# Patient Record
Sex: Female | Born: 1935 | Race: White | Hispanic: No | Smoking: Never smoker
Health system: Southern US, Community
[De-identification: ages and names within clinical notes are randomized; demographics above are authoritative.]

## PROBLEM LIST (undated history)

## (undated) DIAGNOSIS — I4891 Unspecified atrial fibrillation: Secondary | ICD-10-CM

## (undated) DIAGNOSIS — I10 Essential (primary) hypertension: Secondary | ICD-10-CM

## (undated) DIAGNOSIS — M069 Rheumatoid arthritis, unspecified: Secondary | ICD-10-CM

## (undated) DIAGNOSIS — M4802 Spinal stenosis, cervical region: Secondary | ICD-10-CM

## (undated) DIAGNOSIS — M4316 Spondylolisthesis, lumbar region: Secondary | ICD-10-CM

## (undated) DIAGNOSIS — K2980 Duodenitis without bleeding: Secondary | ICD-10-CM

## (undated) DIAGNOSIS — N179 Acute kidney failure, unspecified: Secondary | ICD-10-CM

## (undated) DIAGNOSIS — K311 Adult hypertrophic pyloric stenosis: Secondary | ICD-10-CM

## (undated) DIAGNOSIS — M810 Age-related osteoporosis without current pathological fracture: Secondary | ICD-10-CM

## (undated) DIAGNOSIS — R519 Headache, unspecified: Secondary | ICD-10-CM

## (undated) DIAGNOSIS — I493 Ventricular premature depolarization: Secondary | ICD-10-CM

## (undated) DIAGNOSIS — S42401A Unspecified fracture of lower end of right humerus, initial encounter for closed fracture: Secondary | ICD-10-CM

## (undated) DIAGNOSIS — E785 Hyperlipidemia, unspecified: Secondary | ICD-10-CM

## (undated) DIAGNOSIS — J45909 Unspecified asthma, uncomplicated: Secondary | ICD-10-CM

## (undated) DIAGNOSIS — K3184 Gastroparesis: Secondary | ICD-10-CM

## (undated) DIAGNOSIS — E669 Obesity, unspecified: Secondary | ICD-10-CM

## (undated) DIAGNOSIS — G894 Chronic pain syndrome: Secondary | ICD-10-CM

## (undated) DIAGNOSIS — K279 Peptic ulcer, site unspecified, unspecified as acute or chronic, without hemorrhage or perforation: Secondary | ICD-10-CM

## (undated) DIAGNOSIS — F329 Major depressive disorder, single episode, unspecified: Secondary | ICD-10-CM

## (undated) DIAGNOSIS — I350 Nonrheumatic aortic (valve) stenosis: Secondary | ICD-10-CM

## (undated) DIAGNOSIS — I2510 Atherosclerotic heart disease of native coronary artery without angina pectoris: Secondary | ICD-10-CM

## (undated) DIAGNOSIS — R51 Headache: Secondary | ICD-10-CM

## (undated) DIAGNOSIS — F32A Depression, unspecified: Secondary | ICD-10-CM

## (undated) DIAGNOSIS — K635 Polyp of colon: Secondary | ICD-10-CM

## (undated) DIAGNOSIS — I471 Supraventricular tachycardia: Secondary | ICD-10-CM

## (undated) DIAGNOSIS — K219 Gastro-esophageal reflux disease without esophagitis: Secondary | ICD-10-CM

## (undated) DIAGNOSIS — E871 Hypo-osmolality and hyponatremia: Secondary | ICD-10-CM

## (undated) DIAGNOSIS — I4892 Unspecified atrial flutter: Secondary | ICD-10-CM

## (undated) DIAGNOSIS — Z7409 Other reduced mobility: Secondary | ICD-10-CM

## (undated) DIAGNOSIS — H3530 Unspecified macular degeneration: Secondary | ICD-10-CM

## (undated) DIAGNOSIS — I429 Cardiomyopathy, unspecified: Secondary | ICD-10-CM

## (undated) HISTORY — DX: Supraventricular tachycardia: I47.1

## (undated) HISTORY — DX: Atherosclerotic heart disease of native coronary artery without angina pectoris: I25.10

## (undated) HISTORY — PX: JOINT REPLACEMENT: SHX530

## (undated) HISTORY — DX: Gastro-esophageal reflux disease without esophagitis: K21.9

## (undated) HISTORY — DX: Essential (primary) hypertension: I10

## (undated) HISTORY — DX: Chronic pain syndrome: G89.4

## (undated) HISTORY — DX: Depression, unspecified: F32.A

## (undated) HISTORY — DX: Cardiomyopathy, unspecified: I42.9

## (undated) HISTORY — DX: Unspecified macular degeneration: H35.30

## (undated) HISTORY — PX: HAND SURGERY: SHX662

## (undated) HISTORY — DX: Obesity, unspecified: E66.9

## (undated) HISTORY — DX: Spinal stenosis, cervical region: M48.02

## (undated) HISTORY — DX: Age-related osteoporosis without current pathological fracture: M81.0

## (undated) HISTORY — PX: TONSILLECTOMY: SUR1361

## (undated) HISTORY — PX: ANKLE FUSION: SHX881

## (undated) HISTORY — DX: Duodenitis without bleeding: K29.80

## (undated) HISTORY — DX: Hypo-osmolality and hyponatremia: E87.1

## (undated) HISTORY — DX: Adult hypertrophic pyloric stenosis: K31.1

## (undated) HISTORY — DX: Major depressive disorder, single episode, unspecified: F32.9

## (undated) HISTORY — PX: CHOLECYSTECTOMY: SHX55

## (undated) HISTORY — DX: Gastroparesis: K31.84

## (undated) HISTORY — DX: Spondylolisthesis, lumbar region: M43.16

## (undated) HISTORY — DX: Peptic ulcer, site unspecified, unspecified as acute or chronic, without hemorrhage or perforation: K27.9

## (undated) HISTORY — DX: Hyperlipidemia, unspecified: E78.5

## (undated) HISTORY — PX: CERVICAL FUSION: SHX112

## (undated) HISTORY — DX: Nonrheumatic aortic (valve) stenosis: I35.0

## (undated) HISTORY — DX: Rheumatoid arthritis, unspecified: M06.9

## (undated) HISTORY — DX: Acute kidney failure, unspecified: N17.9

## (undated) HISTORY — PX: CATARACT EXTRACTION, BILATERAL: SHX1313

## (undated) HISTORY — DX: Ventricular premature depolarization: I49.3

## (undated) HISTORY — DX: Unspecified asthma, uncomplicated: J45.909

## (undated) HISTORY — DX: Unspecified atrial flutter: I48.92

---

## 1998-08-06 ENCOUNTER — Emergency Department (HOSPITAL_COMMUNITY): Admission: EM | Admit: 1998-08-06 | Discharge: 1998-08-06 | Payer: Self-pay | Admitting: Emergency Medicine

## 1998-08-06 ENCOUNTER — Encounter: Payer: Self-pay | Admitting: Emergency Medicine

## 1998-11-01 ENCOUNTER — Ambulatory Visit (HOSPITAL_COMMUNITY): Admission: RE | Admit: 1998-11-01 | Discharge: 1998-11-01 | Payer: Self-pay | Admitting: Cardiology

## 1998-11-01 ENCOUNTER — Encounter: Payer: Self-pay | Admitting: Cardiology

## 1999-07-12 ENCOUNTER — Ambulatory Visit (HOSPITAL_COMMUNITY): Admission: RE | Admit: 1999-07-12 | Discharge: 1999-07-12 | Payer: Self-pay | Admitting: Cardiology

## 1999-09-19 ENCOUNTER — Encounter: Admission: RE | Admit: 1999-09-19 | Discharge: 1999-09-19 | Payer: Self-pay

## 1999-10-04 ENCOUNTER — Ambulatory Visit (HOSPITAL_COMMUNITY): Admission: RE | Admit: 1999-10-04 | Discharge: 1999-10-04 | Payer: Self-pay | Admitting: Endocrinology

## 1999-10-04 ENCOUNTER — Encounter: Payer: Self-pay | Admitting: Endocrinology

## 1999-10-19 ENCOUNTER — Ambulatory Visit (HOSPITAL_COMMUNITY): Admission: RE | Admit: 1999-10-19 | Discharge: 1999-10-19 | Payer: Self-pay | Admitting: Endocrinology

## 1999-10-20 ENCOUNTER — Encounter: Payer: Self-pay | Admitting: Endocrinology

## 2000-01-12 ENCOUNTER — Encounter: Admission: RE | Admit: 2000-01-12 | Discharge: 2000-01-12 | Payer: Self-pay | Admitting: Obstetrics and Gynecology

## 2000-01-12 ENCOUNTER — Encounter: Payer: Self-pay | Admitting: Obstetrics and Gynecology

## 2000-02-22 ENCOUNTER — Encounter: Payer: Self-pay | Admitting: Orthopedic Surgery

## 2000-02-26 ENCOUNTER — Inpatient Hospital Stay (HOSPITAL_COMMUNITY): Admission: RE | Admit: 2000-02-26 | Discharge: 2000-02-29 | Payer: Self-pay | Admitting: Orthopedic Surgery

## 2000-02-29 ENCOUNTER — Inpatient Hospital Stay (HOSPITAL_COMMUNITY)
Admission: RE | Admit: 2000-02-29 | Discharge: 2000-03-12 | Payer: Self-pay | Admitting: Physical Medicine & Rehabilitation

## 2000-04-04 ENCOUNTER — Other Ambulatory Visit: Admission: RE | Admit: 2000-04-04 | Discharge: 2000-04-04 | Payer: Self-pay | Admitting: Oral Surgery

## 2000-04-18 ENCOUNTER — Other Ambulatory Visit: Admission: RE | Admit: 2000-04-18 | Discharge: 2000-04-18 | Payer: Self-pay | Admitting: Oral Surgery

## 2001-01-16 ENCOUNTER — Encounter: Payer: Self-pay | Admitting: Obstetrics and Gynecology

## 2001-01-16 ENCOUNTER — Encounter: Admission: RE | Admit: 2001-01-16 | Discharge: 2001-01-16 | Payer: Self-pay | Admitting: Obstetrics and Gynecology

## 2001-06-23 ENCOUNTER — Encounter: Admission: RE | Admit: 2001-06-23 | Discharge: 2001-06-23 | Payer: Self-pay

## 2001-11-14 ENCOUNTER — Ambulatory Visit (HOSPITAL_COMMUNITY): Admission: RE | Admit: 2001-11-14 | Discharge: 2001-11-14 | Payer: Self-pay | Admitting: *Deleted

## 2002-04-24 ENCOUNTER — Encounter: Admission: RE | Admit: 2002-04-24 | Discharge: 2002-04-24 | Payer: Self-pay

## 2002-05-07 ENCOUNTER — Encounter: Admission: RE | Admit: 2002-05-07 | Discharge: 2002-05-07 | Payer: Self-pay

## 2002-05-21 ENCOUNTER — Encounter: Admission: RE | Admit: 2002-05-21 | Discharge: 2002-05-21 | Payer: Self-pay

## 2002-06-16 ENCOUNTER — Encounter: Payer: Self-pay | Admitting: Obstetrics and Gynecology

## 2002-06-16 ENCOUNTER — Encounter: Admission: RE | Admit: 2002-06-16 | Discharge: 2002-06-16 | Payer: Self-pay | Admitting: Obstetrics and Gynecology

## 2002-06-22 ENCOUNTER — Encounter: Admission: RE | Admit: 2002-06-22 | Discharge: 2002-06-22 | Payer: Self-pay | Admitting: Obstetrics and Gynecology

## 2002-06-22 ENCOUNTER — Encounter: Payer: Self-pay | Admitting: Obstetrics and Gynecology

## 2002-07-01 ENCOUNTER — Emergency Department (HOSPITAL_COMMUNITY): Admission: EM | Admit: 2002-07-01 | Discharge: 2002-07-01 | Payer: Self-pay | Admitting: *Deleted

## 2003-05-05 ENCOUNTER — Encounter: Admission: RE | Admit: 2003-05-05 | Discharge: 2003-05-05 | Payer: Self-pay | Admitting: Obstetrics and Gynecology

## 2003-05-05 ENCOUNTER — Encounter: Payer: Self-pay | Admitting: Obstetrics and Gynecology

## 2003-07-30 ENCOUNTER — Encounter (INDEPENDENT_AMBULATORY_CARE_PROVIDER_SITE_OTHER): Payer: Self-pay | Admitting: Specialist

## 2003-07-30 ENCOUNTER — Ambulatory Visit (HOSPITAL_COMMUNITY): Admission: RE | Admit: 2003-07-30 | Discharge: 2003-07-30 | Payer: Self-pay | Admitting: Gastroenterology

## 2003-08-29 ENCOUNTER — Encounter: Admission: RE | Admit: 2003-08-29 | Discharge: 2003-08-29 | Payer: Self-pay

## 2003-10-11 ENCOUNTER — Inpatient Hospital Stay (HOSPITAL_COMMUNITY): Admission: RE | Admit: 2003-10-11 | Discharge: 2003-10-21 | Payer: Self-pay | Admitting: Neurosurgery

## 2003-10-21 ENCOUNTER — Inpatient Hospital Stay (HOSPITAL_COMMUNITY)
Admission: RE | Admit: 2003-10-21 | Discharge: 2003-10-29 | Payer: Self-pay | Admitting: Physical Medicine & Rehabilitation

## 2003-11-30 ENCOUNTER — Encounter
Admission: RE | Admit: 2003-11-30 | Discharge: 2004-02-28 | Payer: Self-pay | Admitting: Physical Medicine & Rehabilitation

## 2004-05-05 ENCOUNTER — Encounter: Admission: RE | Admit: 2004-05-05 | Discharge: 2004-05-05 | Payer: Self-pay | Admitting: Obstetrics and Gynecology

## 2004-05-10 ENCOUNTER — Encounter: Admission: RE | Admit: 2004-05-10 | Discharge: 2004-05-10 | Payer: Self-pay | Admitting: Obstetrics and Gynecology

## 2004-05-15 ENCOUNTER — Encounter
Admission: RE | Admit: 2004-05-15 | Discharge: 2004-08-13 | Payer: Self-pay | Admitting: Physical Medicine & Rehabilitation

## 2004-05-16 ENCOUNTER — Encounter: Admission: RE | Admit: 2004-05-16 | Discharge: 2004-05-16 | Payer: Self-pay | Admitting: Neurosurgery

## 2004-06-05 ENCOUNTER — Encounter: Admission: RE | Admit: 2004-06-05 | Discharge: 2004-06-05 | Payer: Self-pay | Admitting: Neurosurgery

## 2004-06-30 ENCOUNTER — Emergency Department (HOSPITAL_COMMUNITY): Admission: EM | Admit: 2004-06-30 | Discharge: 2004-07-01 | Payer: Self-pay | Admitting: Emergency Medicine

## 2004-07-11 ENCOUNTER — Encounter: Admission: RE | Admit: 2004-07-11 | Discharge: 2004-07-11 | Payer: Self-pay | Admitting: Neurosurgery

## 2004-07-20 ENCOUNTER — Ambulatory Visit (HOSPITAL_COMMUNITY): Admission: RE | Admit: 2004-07-20 | Discharge: 2004-07-20 | Payer: Self-pay | Admitting: *Deleted

## 2004-08-15 ENCOUNTER — Encounter
Admission: RE | Admit: 2004-08-15 | Discharge: 2004-11-13 | Payer: Self-pay | Admitting: Physical Medicine & Rehabilitation

## 2004-08-16 ENCOUNTER — Ambulatory Visit: Payer: Self-pay | Admitting: Physical Medicine & Rehabilitation

## 2004-11-14 ENCOUNTER — Encounter
Admission: RE | Admit: 2004-11-14 | Discharge: 2005-02-12 | Payer: Self-pay | Admitting: Physical Medicine & Rehabilitation

## 2005-02-13 ENCOUNTER — Encounter
Admission: RE | Admit: 2005-02-13 | Discharge: 2005-05-14 | Payer: Self-pay | Admitting: Physical Medicine & Rehabilitation

## 2005-02-14 ENCOUNTER — Ambulatory Visit: Payer: Self-pay | Admitting: Physical Medicine & Rehabilitation

## 2005-03-05 ENCOUNTER — Encounter: Admission: RE | Admit: 2005-03-05 | Discharge: 2005-03-05 | Payer: Self-pay | Admitting: Rheumatology

## 2005-04-25 ENCOUNTER — Encounter: Admission: RE | Admit: 2005-04-25 | Discharge: 2005-04-25 | Payer: Self-pay | Admitting: Internal Medicine

## 2005-05-09 ENCOUNTER — Encounter: Admission: RE | Admit: 2005-05-09 | Discharge: 2005-05-09 | Payer: Self-pay | Admitting: Internal Medicine

## 2005-05-10 ENCOUNTER — Encounter: Admission: RE | Admit: 2005-05-10 | Discharge: 2005-05-10 | Payer: Self-pay | Admitting: Obstetrics and Gynecology

## 2005-05-11 ENCOUNTER — Encounter: Admission: RE | Admit: 2005-05-11 | Discharge: 2005-05-11 | Payer: Self-pay | Admitting: Internal Medicine

## 2005-05-15 ENCOUNTER — Encounter
Admission: RE | Admit: 2005-05-15 | Discharge: 2005-08-13 | Payer: Self-pay | Admitting: Physical Medicine & Rehabilitation

## 2005-05-15 ENCOUNTER — Ambulatory Visit: Payer: Self-pay | Admitting: Physical Medicine & Rehabilitation

## 2005-07-13 ENCOUNTER — Encounter (INDEPENDENT_AMBULATORY_CARE_PROVIDER_SITE_OTHER): Payer: Self-pay | Admitting: Specialist

## 2005-07-13 ENCOUNTER — Ambulatory Visit (HOSPITAL_COMMUNITY): Admission: RE | Admit: 2005-07-13 | Discharge: 2005-07-14 | Payer: Self-pay | Admitting: General Surgery

## 2005-08-10 ENCOUNTER — Ambulatory Visit: Payer: Self-pay | Admitting: Physical Medicine & Rehabilitation

## 2005-08-10 ENCOUNTER — Encounter
Admission: RE | Admit: 2005-08-10 | Discharge: 2005-11-08 | Payer: Self-pay | Admitting: Physical Medicine & Rehabilitation

## 2005-08-22 ENCOUNTER — Encounter
Admission: RE | Admit: 2005-08-22 | Discharge: 2005-08-22 | Payer: Self-pay | Admitting: Physical Medicine & Rehabilitation

## 2005-09-07 ENCOUNTER — Encounter
Admission: RE | Admit: 2005-09-07 | Discharge: 2005-09-07 | Payer: Self-pay | Admitting: Physical Medicine & Rehabilitation

## 2005-09-28 ENCOUNTER — Encounter
Admission: RE | Admit: 2005-09-28 | Discharge: 2005-09-28 | Payer: Self-pay | Admitting: Physical Medicine & Rehabilitation

## 2005-11-06 ENCOUNTER — Encounter
Admission: RE | Admit: 2005-11-06 | Discharge: 2006-02-04 | Payer: Self-pay | Admitting: Physical Medicine & Rehabilitation

## 2005-11-06 ENCOUNTER — Ambulatory Visit: Payer: Self-pay | Admitting: Physical Medicine & Rehabilitation

## 2006-01-30 ENCOUNTER — Ambulatory Visit: Payer: Self-pay | Admitting: Physical Medicine & Rehabilitation

## 2006-04-19 ENCOUNTER — Encounter
Admission: RE | Admit: 2006-04-19 | Discharge: 2006-07-18 | Payer: Self-pay | Admitting: Physical Medicine & Rehabilitation

## 2006-04-19 ENCOUNTER — Ambulatory Visit: Payer: Self-pay | Admitting: Physical Medicine & Rehabilitation

## 2006-05-13 ENCOUNTER — Encounter: Admission: RE | Admit: 2006-05-13 | Discharge: 2006-05-13 | Payer: Self-pay

## 2006-08-01 ENCOUNTER — Encounter
Admission: RE | Admit: 2006-08-01 | Discharge: 2006-10-30 | Payer: Self-pay | Admitting: Physical Medicine & Rehabilitation

## 2006-08-01 ENCOUNTER — Ambulatory Visit: Payer: Self-pay | Admitting: Physical Medicine & Rehabilitation

## 2006-10-21 ENCOUNTER — Ambulatory Visit: Payer: Self-pay | Admitting: Physical Medicine & Rehabilitation

## 2006-11-12 ENCOUNTER — Encounter: Admission: RE | Admit: 2006-11-12 | Discharge: 2006-11-12 | Payer: Self-pay | Admitting: Neurosurgery

## 2006-11-15 ENCOUNTER — Encounter: Admission: RE | Admit: 2006-11-15 | Discharge: 2006-11-15 | Payer: Self-pay | Admitting: Neurosurgery

## 2006-12-10 ENCOUNTER — Inpatient Hospital Stay (HOSPITAL_COMMUNITY): Admission: AD | Admit: 2006-12-10 | Discharge: 2006-12-12 | Payer: Self-pay | Admitting: Cardiology

## 2006-12-20 ENCOUNTER — Ambulatory Visit: Payer: Self-pay | Admitting: Vascular Surgery

## 2006-12-20 ENCOUNTER — Ambulatory Visit (HOSPITAL_COMMUNITY): Admission: RE | Admit: 2006-12-20 | Discharge: 2006-12-20 | Payer: Self-pay | Admitting: Cardiology

## 2006-12-25 ENCOUNTER — Encounter: Admission: RE | Admit: 2006-12-25 | Discharge: 2006-12-25 | Payer: Self-pay | Admitting: Cardiology

## 2007-01-16 ENCOUNTER — Ambulatory Visit: Payer: Self-pay | Admitting: Physical Medicine & Rehabilitation

## 2007-01-16 ENCOUNTER — Encounter
Admission: RE | Admit: 2007-01-16 | Discharge: 2007-04-16 | Payer: Self-pay | Admitting: Physical Medicine & Rehabilitation

## 2007-02-12 ENCOUNTER — Encounter: Admission: RE | Admit: 2007-02-12 | Discharge: 2007-02-12 | Payer: Self-pay | Admitting: Obstetrics and Gynecology

## 2007-05-05 ENCOUNTER — Encounter
Admission: RE | Admit: 2007-05-05 | Discharge: 2007-08-03 | Payer: Self-pay | Admitting: Physical Medicine & Rehabilitation

## 2007-05-05 ENCOUNTER — Ambulatory Visit: Payer: Self-pay | Admitting: Physical Medicine & Rehabilitation

## 2007-06-26 ENCOUNTER — Encounter: Admission: RE | Admit: 2007-06-26 | Discharge: 2007-06-26 | Payer: Self-pay | Admitting: Obstetrics and Gynecology

## 2007-09-11 ENCOUNTER — Encounter
Admission: RE | Admit: 2007-09-11 | Discharge: 2007-09-12 | Payer: Self-pay | Admitting: Physical Medicine & Rehabilitation

## 2007-09-11 ENCOUNTER — Ambulatory Visit: Payer: Self-pay | Admitting: Physical Medicine & Rehabilitation

## 2007-12-31 ENCOUNTER — Ambulatory Visit: Payer: Self-pay | Admitting: Physical Medicine & Rehabilitation

## 2007-12-31 ENCOUNTER — Encounter
Admission: RE | Admit: 2007-12-31 | Discharge: 2008-01-01 | Payer: Self-pay | Admitting: Physical Medicine & Rehabilitation

## 2008-03-24 ENCOUNTER — Encounter
Admission: RE | Admit: 2008-03-24 | Discharge: 2008-03-26 | Payer: Self-pay | Admitting: Physical Medicine & Rehabilitation

## 2008-03-26 ENCOUNTER — Ambulatory Visit: Payer: Self-pay | Admitting: Physical Medicine & Rehabilitation

## 2008-07-06 ENCOUNTER — Encounter: Admission: RE | Admit: 2008-07-06 | Discharge: 2008-07-06 | Payer: Self-pay | Admitting: Obstetrics and Gynecology

## 2008-07-08 ENCOUNTER — Encounter (INDEPENDENT_AMBULATORY_CARE_PROVIDER_SITE_OTHER): Payer: Self-pay | Admitting: Otolaryngology

## 2008-07-08 ENCOUNTER — Ambulatory Visit (HOSPITAL_COMMUNITY): Admission: RE | Admit: 2008-07-08 | Discharge: 2008-07-08 | Payer: Self-pay | Admitting: Otolaryngology

## 2008-08-26 ENCOUNTER — Encounter
Admission: RE | Admit: 2008-08-26 | Discharge: 2008-08-27 | Payer: Self-pay | Admitting: Physical Medicine & Rehabilitation

## 2008-08-27 ENCOUNTER — Ambulatory Visit: Payer: Self-pay | Admitting: Physical Medicine & Rehabilitation

## 2008-11-19 ENCOUNTER — Ambulatory Visit: Payer: Self-pay | Admitting: Physical Medicine & Rehabilitation

## 2008-11-19 ENCOUNTER — Encounter
Admission: RE | Admit: 2008-11-19 | Discharge: 2008-11-19 | Payer: Self-pay | Admitting: Physical Medicine & Rehabilitation

## 2009-02-03 ENCOUNTER — Encounter: Admission: RE | Admit: 2009-02-03 | Discharge: 2009-02-03 | Payer: Self-pay

## 2009-02-10 ENCOUNTER — Encounter
Admission: RE | Admit: 2009-02-10 | Discharge: 2009-05-11 | Payer: Self-pay | Admitting: Physical Medicine & Rehabilitation

## 2009-02-14 ENCOUNTER — Ambulatory Visit: Payer: Self-pay | Admitting: Physical Medicine & Rehabilitation

## 2009-03-16 ENCOUNTER — Ambulatory Visit: Payer: Self-pay | Admitting: Physical Medicine & Rehabilitation

## 2009-04-14 ENCOUNTER — Ambulatory Visit: Payer: Self-pay | Admitting: Physical Medicine & Rehabilitation

## 2009-05-10 ENCOUNTER — Encounter
Admission: RE | Admit: 2009-05-10 | Discharge: 2009-08-08 | Payer: Self-pay | Admitting: Physical Medicine & Rehabilitation

## 2009-05-13 ENCOUNTER — Ambulatory Visit: Payer: Self-pay | Admitting: Physical Medicine & Rehabilitation

## 2009-06-08 ENCOUNTER — Ambulatory Visit: Payer: Self-pay | Admitting: Physical Medicine & Rehabilitation

## 2009-07-07 ENCOUNTER — Encounter: Admission: RE | Admit: 2009-07-07 | Discharge: 2009-07-07 | Payer: Self-pay | Admitting: Internal Medicine

## 2009-07-10 ENCOUNTER — Emergency Department (HOSPITAL_COMMUNITY): Admission: EM | Admit: 2009-07-10 | Discharge: 2009-07-11 | Payer: Self-pay | Admitting: Emergency Medicine

## 2009-07-13 ENCOUNTER — Encounter: Admission: RE | Admit: 2009-07-13 | Discharge: 2009-07-13 | Payer: Self-pay | Admitting: Gastroenterology

## 2009-07-13 ENCOUNTER — Encounter
Admission: RE | Admit: 2009-07-13 | Discharge: 2009-10-11 | Payer: Self-pay | Admitting: Physical Medicine & Rehabilitation

## 2009-07-15 ENCOUNTER — Ambulatory Visit: Payer: Self-pay | Admitting: Physical Medicine & Rehabilitation

## 2009-08-05 ENCOUNTER — Ambulatory Visit: Payer: Self-pay | Admitting: Physical Medicine & Rehabilitation

## 2009-08-06 ENCOUNTER — Inpatient Hospital Stay (HOSPITAL_COMMUNITY): Admission: EM | Admit: 2009-08-06 | Discharge: 2009-08-12 | Payer: Self-pay | Admitting: Emergency Medicine

## 2009-08-10 ENCOUNTER — Encounter (INDEPENDENT_AMBULATORY_CARE_PROVIDER_SITE_OTHER): Payer: Self-pay | Admitting: Gastroenterology

## 2009-08-30 ENCOUNTER — Ambulatory Visit: Payer: Self-pay | Admitting: Physical Medicine & Rehabilitation

## 2009-10-06 ENCOUNTER — Encounter
Admission: RE | Admit: 2009-10-06 | Discharge: 2009-11-02 | Payer: Self-pay | Admitting: Physical Medicine & Rehabilitation

## 2009-10-07 ENCOUNTER — Ambulatory Visit: Payer: Self-pay | Admitting: Physical Medicine & Rehabilitation

## 2009-11-09 ENCOUNTER — Encounter
Admission: RE | Admit: 2009-11-09 | Discharge: 2010-02-07 | Payer: Self-pay | Admitting: Physical Medicine & Rehabilitation

## 2009-11-11 ENCOUNTER — Ambulatory Visit: Payer: Self-pay | Admitting: Physical Medicine & Rehabilitation

## 2009-12-08 ENCOUNTER — Ambulatory Visit (HOSPITAL_COMMUNITY): Admission: RE | Admit: 2009-12-08 | Discharge: 2009-12-08 | Payer: Self-pay | Admitting: Gastroenterology

## 2010-01-05 ENCOUNTER — Ambulatory Visit: Payer: Self-pay | Admitting: Physical Medicine & Rehabilitation

## 2010-03-01 ENCOUNTER — Encounter
Admission: RE | Admit: 2010-03-01 | Discharge: 2010-03-06 | Payer: Self-pay | Admitting: Physical Medicine & Rehabilitation

## 2010-03-06 ENCOUNTER — Ambulatory Visit: Payer: Self-pay | Admitting: Physical Medicine & Rehabilitation

## 2010-05-11 ENCOUNTER — Ambulatory Visit: Payer: Self-pay | Admitting: Physical Medicine & Rehabilitation

## 2010-05-11 ENCOUNTER — Encounter
Admission: RE | Admit: 2010-05-11 | Discharge: 2010-08-09 | Payer: Self-pay | Admitting: Physical Medicine & Rehabilitation

## 2010-06-28 ENCOUNTER — Ambulatory Visit: Payer: Self-pay | Admitting: Physical Medicine & Rehabilitation

## 2010-07-17 ENCOUNTER — Encounter: Admission: RE | Admit: 2010-07-17 | Discharge: 2010-07-17 | Payer: Self-pay | Admitting: Internal Medicine

## 2010-07-19 ENCOUNTER — Encounter: Admission: RE | Admit: 2010-07-19 | Discharge: 2010-07-19 | Payer: Self-pay | Admitting: Internal Medicine

## 2010-08-17 ENCOUNTER — Encounter
Admission: RE | Admit: 2010-08-17 | Discharge: 2010-11-01 | Payer: Self-pay | Source: Home / Self Care | Attending: Physical Medicine & Rehabilitation | Admitting: Physical Medicine & Rehabilitation

## 2010-08-24 ENCOUNTER — Ambulatory Visit: Payer: Self-pay | Admitting: Physical Medicine & Rehabilitation

## 2010-11-01 ENCOUNTER — Ambulatory Visit: Payer: Self-pay | Admitting: Physical Medicine & Rehabilitation

## 2010-11-26 ENCOUNTER — Encounter: Payer: Self-pay | Admitting: Neurosurgery

## 2010-11-27 ENCOUNTER — Encounter: Payer: Self-pay | Admitting: Gastroenterology

## 2010-12-29 ENCOUNTER — Other Ambulatory Visit: Payer: Self-pay

## 2011-02-08 LAB — GLUCOSE, CAPILLARY
Glucose-Capillary: 101 mg/dL — ABNORMAL HIGH (ref 70–99)
Glucose-Capillary: 119 mg/dL — ABNORMAL HIGH (ref 70–99)
Glucose-Capillary: 132 mg/dL — ABNORMAL HIGH (ref 70–99)
Glucose-Capillary: 142 mg/dL — ABNORMAL HIGH (ref 70–99)
Glucose-Capillary: 158 mg/dL — ABNORMAL HIGH (ref 70–99)
Glucose-Capillary: 166 mg/dL — ABNORMAL HIGH (ref 70–99)
Glucose-Capillary: 179 mg/dL — ABNORMAL HIGH (ref 70–99)
Glucose-Capillary: 77 mg/dL (ref 70–99)
Glucose-Capillary: 83 mg/dL (ref 70–99)
Glucose-Capillary: 84 mg/dL (ref 70–99)
Glucose-Capillary: 85 mg/dL (ref 70–99)
Glucose-Capillary: 88 mg/dL (ref 70–99)
Glucose-Capillary: 96 mg/dL (ref 70–99)

## 2011-02-08 LAB — BASIC METABOLIC PANEL WITH GFR
BUN: 9 mg/dL (ref 6–23)
CO2: 22 meq/L (ref 19–32)
Calcium: 7.4 mg/dL — ABNORMAL LOW (ref 8.4–10.5)
Chloride: 109 meq/L (ref 96–112)
Creatinine, Ser: 0.51 mg/dL (ref 0.4–1.2)
GFR calc non Af Amer: >60 mL/min
Glucose, Bld: 190 mg/dL — ABNORMAL HIGH (ref 70–99)
Potassium: 4 meq/L (ref 3.5–5.1)
Sodium: 137 meq/L (ref 135–145)

## 2011-02-08 LAB — CBC
HCT: 32.3 % — ABNORMAL LOW (ref 36.0–46.0)
HCT: 35.7 % — ABNORMAL LOW (ref 36.0–46.0)
Hemoglobin: 11 g/dL — ABNORMAL LOW (ref 12.0–15.0)
Hemoglobin: 12 g/dL (ref 12.0–15.0)
MCHC: 33.7 g/dL (ref 30.0–36.0)
MCHC: 34 g/dL (ref 30.0–36.0)
MCV: 99 fL (ref 78.0–100.0)
MCV: 99.2 fL (ref 78.0–100.0)
Platelets: 165 10*3/uL (ref 150–400)
RBC: 3.26 MIL/uL — ABNORMAL LOW (ref 3.87–5.11)
RBC: 3.6 MIL/uL — ABNORMAL LOW (ref 3.87–5.11)
RDW: 15.2 % (ref 11.5–15.5)
WBC: 8 10*3/uL (ref 4.0–10.5)

## 2011-02-08 LAB — BASIC METABOLIC PANEL
BUN: <1 mg/dL — ABNORMAL LOW (ref 6–23)
CO2: 24 mEq/L (ref 19–32)
Calcium: 8 mg/dL — ABNORMAL LOW (ref 8.4–10.5)
Chloride: 102 mEq/L (ref 96–112)
Chloride: 111 mEq/L (ref 96–112)
Creatinine, Ser: 0.62 mg/dL (ref 0.4–1.2)
GFR calc Af Amer: >60 mL/min (ref >60)
GFR calc Af Amer: >60 mL/min (ref >60)
Glucose, Bld: 105 mg/dL — ABNORMAL HIGH (ref 70–99)
Glucose, Bld: 86 mg/dL (ref 70–99)
Potassium: 3.2 mEq/L — ABNORMAL LOW (ref 3.5–5.1)
Potassium: 3.2 mEq/L — ABNORMAL LOW (ref 3.5–5.1)
Sodium: 138 mEq/L (ref 135–145)

## 2011-02-08 LAB — COMPREHENSIVE METABOLIC PANEL
BUN: 1 mg/dL — ABNORMAL LOW (ref 6–23)
CO2: 23 mEq/L (ref 19–32)
Calcium: 7.9 mg/dL — ABNORMAL LOW (ref 8.4–10.5)
Creatinine, Ser: 0.49 mg/dL (ref 0.4–1.2)
GFR calc Af Amer: >60 mL/min (ref >60)
GFR calc non Af Amer: >60 mL/min (ref >60)
Glucose, Bld: 110 mg/dL — ABNORMAL HIGH (ref 70–99)
Total Bilirubin: 0.4 mg/dL (ref 0.3–1.2)

## 2011-02-08 LAB — CLOTEST (H. PYLORI), BIOPSY: Helicobacter screen: NEGATIVE

## 2011-02-08 LAB — HEMOGLOBIN A1C
Hgb A1c MFr Bld: 5.2 % (ref 4.6–6.1)
Mean Plasma Glucose: 103 mg/dL

## 2011-02-08 LAB — H. PYLORI ANTIBODY, IGG: H Pylori IgG: <0.4 {ISR}

## 2011-02-08 LAB — POCT CARDIAC MARKERS
Myoglobin, poc: 49.3 ng/mL (ref 12–200)
Troponin i, poc: <0.05 ng/mL (ref 0.00–0.09)

## 2011-02-09 LAB — COMPREHENSIVE METABOLIC PANEL
Albumin: 2.7 g/dL — ABNORMAL LOW (ref 3.5–5.2)
BUN: 7 mg/dL (ref 6–23)
Creatinine, Ser: 0.58 mg/dL (ref 0.4–1.2)
Total Protein: 5.4 g/dL — ABNORMAL LOW (ref 6.0–8.3)

## 2011-02-09 LAB — DIFFERENTIAL
Basophils Absolute: 0 10*3/uL (ref 0.0–0.1)
Lymphocytes Relative: 23 % (ref 12–46)
Monocytes Absolute: 0.7 10*3/uL (ref 0.1–1.0)
Monocytes Relative: 12 % (ref 3–12)
Neutro Abs: 4 10*3/uL (ref 1.7–7.7)

## 2011-02-09 LAB — CBC
HCT: 42.3 % (ref 36.0–46.0)
MCV: 98.8 fL (ref 78.0–100.0)
Platelets: 236 10*3/uL (ref 150–400)
RDW: 15.2 % (ref 11.5–15.5)

## 2011-02-09 LAB — POCT CARDIAC MARKERS
Myoglobin, poc: 38.5 ng/mL (ref 12–200)
Troponin i, poc: <0.05 ng/mL (ref 0.00–0.09)

## 2011-02-21 ENCOUNTER — Encounter: Payer: Medicare Other | Attending: Physical Medicine & Rehabilitation | Admitting: Physical Medicine & Rehabilitation

## 2011-02-21 DIAGNOSIS — M4800 Spinal stenosis, site unspecified: Secondary | ICD-10-CM

## 2011-02-21 DIAGNOSIS — M47812 Spondylosis without myelopathy or radiculopathy, cervical region: Secondary | ICD-10-CM

## 2011-02-21 DIAGNOSIS — M069 Rheumatoid arthritis, unspecified: Secondary | ICD-10-CM | POA: Insufficient documentation

## 2011-02-21 DIAGNOSIS — Q762 Congenital spondylolisthesis: Secondary | ICD-10-CM | POA: Insufficient documentation

## 2011-02-21 DIAGNOSIS — M4712 Other spondylosis with myelopathy, cervical region: Secondary | ICD-10-CM | POA: Insufficient documentation

## 2011-02-21 DIAGNOSIS — I2510 Atherosclerotic heart disease of native coronary artery without angina pectoris: Secondary | ICD-10-CM | POA: Insufficient documentation

## 2011-02-22 NOTE — Assessment & Plan Note (Signed)
Angela [name] is back regarding her cervical stenosis, myelopathy, and rheumatoid arthritis.  She continues to improve off the OxyContin being more alert, awake, and begin active.  She is out at the yard and at the beach in her powered scooter helping with activities.  Family is very happy with how she has been doing.  Her pain is about 5-6/10.  She usually uses 3 Percocet for a day for pain and utilize the Voltaren gel, Lidoderm, and Biofreeze for topical relief and likes these as well.  REVIEW OF SYSTEMS:  Notable for the above.  Full 12-point review is in the written health history section of the chart.  SOCIAL HISTORY:  The patient is married, lives with her husband who is with her as always today.  PHYSICAL EXAMINATION:  VITAL SIGNS:  Blood pressure is 115/68, pulse 73, respiratory rate 18, and she is saturating 96% on room air. GENERAL:  The patient is pleasant, alert, and oriented. MUSCULOSKELETAL:  She has an obvious rheumatoid deformities of the hands and feet.  Neck is stable with limited range of motion.  Strength is generally intact per baseline in the upper and lower extremities. HEART:  Regular. CHEST:  Clear. ABDOMEN:  Soft and nontender.  ASSESSMENT: 1. Cervical stenosis with myelopathy status post T1-T2 laminectomy. 2. Rheumatoid arthritis. 3. Lumbar spondylolisthesis. 4. Coronary artery disease.  PLAN: 1. We discussed continued activity to tolerance.  I really think she     is doing well and knows her limits. 2. We will refill her Percocet 7.5 one q.6 h p.r.n. #120.  She is     given another prescription for next month.  I asked her to check     with Dr. Valentina Lucks to see if he will be willing to follow her     Percocet for her.  If not, we will continue to follow as we have     been. 3. She will stay with the Voltaren gel, Lidoderm, and Biofreeze. 4. Nurse practitioner will see her back in 2 months' time.     Ranelle Oyster, M.D. Electronically  Signed    ZTS/MedQ D:  02/21/2011 14:10:40  T:  02/22/2011 01:13:02  Job #:  [id number]  cc:   Thora Lance, M.D. Fax: [phone]

## 2011-03-20 NOTE — Assessment & Plan Note (Signed)
Angela [name] returns to the clinic today for followup evaluation.  She  last saw the PA with Dr. Newell Coral, specifically [place]y, Mar 05, 2008.  At  that time, they adjusted her dose of Neurontin from 1800 mg daily to  3600 mg daily.  She is taking 600 mg 2 tablets t.i.d. at present.  She  does need a refill on her oxycodone immediate release and her OxyContin  sustained release.  She also requests samples of Lidoderm patch along  with Lidoderm prescription.  Her family is somewhat concerned about some  memory problems that she has had.  They would like her to be on Aricept,  and she is willing to take that at the present time.   MEDICATIONS:  1. Nexium 40 mg b.i.d.  2. Toprol XL 25 mg daily.  3. Prednisone 5 mg daily.  4. Lasix 20 mg daily.  5. Zocor 40 mg daily.  6. Potassium chloride 20 mEq daily.  7. Oxycodone 5/325 one tablet q.i.d. p.r.n.  8. OxyContin sustained release 20 mg q.h.s.  9. Flexeril 10 mg p.r.n.  10.Methotrexate 2.5 mg 3 tablets per week.  11.Allegra 180 mg daily.  12.Lorazepam 1 mg q.h.s.  13.Nasonex 2 sprays each nostril daily.  14.Combivent 2 puffs q.i.d.  15.Singulair 10 mg daily.  16.Calcium with vitamin D 500 mg t.i.d.  17.Actonel 35 mg weekly.  18.Gabapentin 600 mg 2 tablets t.i.d.  19.Plavix 75 mg daily.  20.Aspirin 81 mg daily.   REVIEW OF SYSTEMS:  Noncontributory.   PHYSICAL EXAMINATION:  GENERAL:  A well-appearing middle-aged to elderly  adult female sitting in a manual wheelchair.  VITAL SIGNS:  Blood pressure is 132/50 with a pulse of 55, respiratory  rate 18 and O2 saturation 94% on room air.  EXTREMITIES:  She has 4-/5 strength throughout the bilateral upper and  lower extremities.  She has significantly decreased cervical range of  motion.   IMPRESSION:  1. Status post cervical one-cervical two laminectomy secondary to      cervical stenosis with myelopathy.  2. History of rheumatoid arthritis with several joint replacements.  3.  Moderate-to-severe lumbar spondylolisthesis at lumbar three-four      and lumbar four-five.  4. Hypertension.  5. Coronary artery disease with recent stenting of the left anterior      descending.   In the office today, we did refill the patient's oxycodone sustained  release and Percocet each as of April 10, 2008.  We also started her  Aricept 5 mg p.o. q.h.s. and gave her samples of Lidoderm patches along  with a prescription dated for April 29, 2008.  Her husband will be  mailing in the Lidoderm prescription as she has coverage for that  medication through at least September 2009.  We will plan on seeing her  in followup in approximately 3 months' time.           ______________________________  [name], M.D.     DC/MedQ  D:  03/26/2008 14:35:18  T:  03/26/2008 15:21:30  Job #:  [id number]

## 2011-03-20 NOTE — Assessment & Plan Note (Signed)
Angela [name] returns to clinic today for followup evaluation accompanied  by her husband.  Unfortunately, the patient fell at her home through the  night August 05, 2008, and fractured her right elbow.  She was seen by  Dr. Priscille Kluver locally, and he recommended that she be seen by [facility]  physicians.  She went into see the [facility] physicians yesterday, and they  immediately said that she was not a candidate for open reduction and  internal fixation or an elbow arthroplasty.  She has been asked to wear  her splint, and a sling on her right arm and allowed the fracture to  heal gradually overtime.  She has used slightly increased amounts of her  Percocet and we would like to have the ability to use slightly increased  amounts compared to what she was using previously which was about 5 per  day.   MEDICATIONS:  1. Nexium 40 mg b.i.d.  2. Toprol-XL 25 mg daily.  3. Prednisone 5 mg daily.  4. Lasix 20 mg daily.  5. Zocor 40 mg daily.  6. Potassium chloride 20 mEq daily.  7. Oxycodone 7.5/325 one tablet q.i.d. p.r.n.  8. OxyContin sustained release 20 mg at bedtime.  9. Flexeril 10 mg p.r.n.  10.Methotrexate 7.5 mg 3 tablets weekly.  11.Allegra 180 mg daily.  12.Lorazepam 1 mg at bedtime.  13.Nasonex 2 sprays each nostril daily.  14.Combivent 2 puffs q.i.d.  15.Singulair 10 mg daily.  16.Calcium with vitamin D 500 mg t.i.d.  17.Actonel 35 mg weekly.  18.Gabapentin 600 mg 2 tablets t.i.d.  19.Plavix 75 mg daily.  20.Aspirin 81 mg daily.   REVIEW OF SYSTEMS:  Noncontributory.   PHYSICAL EXAMINATION:  GENERAL:  Elderly adult female seated in a manual  wheelchair.  VITAL SIGNS:  Vitals were not obtained.  EXTREMITIES:  She has a sling on her right upper extremity, has a bruise  of the right elbow with decreased range of motion.   IMPRESSION:  1. Status post cervical C1-C2 laminectomy secondary to cervical      stenosis with myelopathy.  2. History of rheumatoid arthritis with several joint  replacements.  3. Moderate-to-severe lumbar spondylolisthesis of the lumbar L3 and L4      vertebrae.  4. Hypertension.  5. Coronary artery disease with recent stenting of the left anterior      descending.  6. Status post fall with right elbow fracture, conservative care.   In the office today, we did refill her Percocet 7.5/325 one tablet 7  times per day p.r.n. as of September 05, 2008.  We also refilled her  OxyContin for that date.  We will plan on seeing her in followup in  approximately 3 months' time with refills prior to that appointment as  necessary.           ______________________________  [name], M.D.     DC/MedQ  D:  08/27/2008 13:36:36  T:  08/28/2008 02:27:39  Job #:  [id number]

## 2011-03-20 NOTE — Op Note (Signed)
NAME:  Angela [name], Angela [name]                ACCOUNT NO.:  [id number]   MEDICAL RECORD NO.:  [id number]          PATIENT TYPE:  AMB   LOCATION:  SDS                          FACILITY:  [facility]   PHYSICIAN:  Jefry H. Pollyann Kennedy, MD     DATE OF BIRTH:  September 28, 1936   DATE OF PROCEDURE:  07/08/2008  DATE OF DISCHARGE:  07/08/2008                               OPERATIVE REPORT   PREOPERATIVE DIAGNOSIS:  Right auricular lesion, possible carcinoma.   POSTOPERATIVE DIAGNOSIS:  Right auricular lesion, possible carcinoma.   PROCEDURE:  Wedge excision of the right auricular lesion.   SURGEON:  Jefry H. Pollyann Kennedy, MD   ANESTHESIA:  Local anesthesia was used with monitored anesthesia care  and intravenous sedation.   BLOOD LOSS:  No blood loss.   FINDINGS:  A slightly longer than 1-cm crusted lesion along the right  helical rim.   REFERRING PHYSICIAN:  Armanda Magic, MD   HISTORY:  This is a 75 year old lady with a 1-year history of an  intermittently painful lesion outside the right auricle.  Risks,  benefits, alternatives, and complications of the procedure were  explained to the patient.  She seemed to understand and agreed for  surgery.   PROCEDURE:  The patient was taken to the operating room, placed on the  operating table in supine position.  Following the induction of  intravenous sedation, the right ear was prepped and draped in a standard  fashion.  A marking pen was used to outline the proposed incisions  around the lesion leaving adequate margins.  A 1% Xylocaine with  epinephrine was infiltrated around the entire proposed incision.  A 15  scalpel was used to excise the lesion in its entirety.  An additional  wedge was removed from the antihelical, somewhat into the conchal  cartilage to facilitate closure.  The wound was then closed in layers  using 4-0 chromic on the cartilage and combination of interrupted and  running 5-0 plain gut on the skin.  Bacitracin was applied.  Specimen  was sent  for pathologic evaluation.  The patient was then transferred to  recovery in stable condition.      Jefry H. Pollyann Kennedy, MD  Electronically Signed     JHR/MEDQ  D:  07/08/2008  T:  07/09/2008  Job:  [id number]   cc:   Armanda Magic, M.D.

## 2011-03-20 NOTE — Assessment & Plan Note (Signed)
Angela [name] returns to clinic today for follow-up evaluation accompanied  by her husband.  Overall she is doing fairly well.  She does complaint  of some bloating especially after dinner in the evening and feels that  she needs to be evaluated for that.  She has not yet mentioned it to her  new primary care physician Dr. Valentina Lucks but she plans to do so in the  near future.  She has regular bowel movements at this time at least once  to twice per day. She has used Reglan in the past but she does not know  the reason nor does she know why that was stopped.   The patient has been experiencing some left upper arm pain.  She reports  that he does help with that pain.  She has not yet tried lidocaine patch  for that area.   The patient does need a refill on her OxyContin sustained release 20 mg  q.h.s. and her Percocet which she usually 4 x per day.   In terms of her abdominal complaints she does feel a burning sensation  occasionally. She has not been told that she has any ulcers but is  concerned about that and is considering requesting and endoscopy.   MEDICATIONS:  1. Nexium 40 mg b.i.d.  2. Toprol XL 25 mg q. day.  3. Prednisone 5 mg q. day.  4. Lasix 20 mg q. day.  5. Zocor 40 mg q. day.  6. Potassium chloride 20 mEq q. day.  7. Oxycodone 5/325 1 tablet q.i.d. p.r.n.  8. OxyContin sustained release 20 mg q.h.s.  9. Flexeril 10 mg p.r.n.  10.Methotrexate 2.5 mg 3 tablets per week.  11.Allegra 180 mg q. day.  12.Lorazepam 1 mg q.h.s.  13.Nasonex 2 sprays each nostril daily.  14.Combivent 2 puffs q.i.d.  15.Singulair 10 mg 1 tablet q. day.  16.Calcium with vitamin D 500 mg t.i.d.  17.Actonel 35 mg q. week.  18.Gabapentin 300 mg t.i.d.  19.Plavix 75 mg q. day.  20.Aspirin 81 mg q. day.   REVIEW OF SYSTEMS:  Non-contributory.   PHYSICAL EXAMINATION:  GENERAL:  Reasonably well appearing overweight  adult elderly female seated in a manual wheelchair.  VITALS:  Blood pressure is  102/41 with pulse of 58, respiratory 18, and  O2 saturation 97% on room air.  EXTREMITIES:  She has 4-/5 strength throughout the bilateral upper and  lower extremities.   IMPRESSION:  1. Status post C1-C2 laminectomy secondary to cervical stenosis with      myelopathy.  2. History of rheumatoid arthritis with several joint replacements.  3. Moderate to severe lumbar spondylolisthesis at L3/L4 and L4/L5.  4. Hypertension.  5. Coronary artery disease with recent stenting of the left anterior      descending.   In the office today we did refill the patient's Percocet and OxyContin  as of the 15th of this month.  We also gave her samples of lidocaine  patches to be applied to the left arm and back and prescription for the  lidocaine patch.  Also gave her a prescription for low dose Reglan to be  used 5 mg b.i.d. p.r.n. over the next couple of weeks to see if that  helps with any of her GI symptoms.  She will be contacting her primary  care physician to see about seeing a GI specialist if the low dose  Reglan does not give her some benefit.  She ultimately will likely need  endoscopy to rule  out ulcer disease.   Will plan on seeing the patient in follow-up in approximately 4 months  time with refills prior to that appointment if necessary.           ______________________________  [name], M.D.     DC/MedQ  D:  09/12/2007 15:00:06  T:  09/13/2007 08:08:46  Job #:  [id number]

## 2011-03-20 NOTE — Assessment & Plan Note (Signed)
[name] returns to clinic today accompanied by her husband.  The  patient is doing fairly well overall.  Her husband has been able to get  the insurance carrier to cover the Lidoderm patches.  She continues to  use 2 of those daily on her left lower buttock and upper left thigh that  seems to give her good amount of relief and fortunately the insurance  company has reconsidered their initial decision.  The patient continues  to use OxyContin sustained-release 20 mg at bedtime and oxycodone  7.5/325 up to 7 times per day, although she uses it 4-5 times per day at  present.  Her rheumatologist has taken her off the methotrexate at this  time.  Dr. Newell Coral has also lowered her Neurontin dose down to 1800 mg  daily.   The patient does need to refill on her OxyContin and oxycodone in the  office today.   MEDICATIONS:  1. Nexium 40 mg b.i.d.  2. Toprol-XL 25 mg daily.  3. Prednisone 5 mg daily.  4. Lasix 20 mg daily.  5. Zocor 40 mg daily.  6. Potassium chloride 20 mEq daily.  7. Oxycodone 7.5/325 one tablet 7 times per day p.r.n. (4-5 per day).  8. OxyContin sustained-release 20 mg nightly.  9. Flexeril 10 mg daily p.r.n.  10.Allegra 180 mg.  11.Lorazepam 1 mg nightly.  12.Nasonex 2 sprays each nostril daily.  13.Combivent 2 puffs q.i.d.  14.Singulair 10 mg daily.  15.Calcium with vitamin D 500 mg t.i.d.  16.Actonel 35 mg weekly.  17.Gabapentin 600 mg 1 tablet t.i.d.  18.Plavix 75 mg daily.  19.Aspirin 81 mg daily   REVIEW OF SYSTEMS:  Noncontributory.   The patient reports that her pain interferes with her standing,  sleeping, social life, traveling, walking, lifting, personal care and  household management.   PHYSICAL EXAMINATION:  GENERAL:  A well appearing elderly adult female  seated in a manual chair.  VITAL SIGNS:  Not obtained.  MUSCULOSKELETAL:  She has 4-/5 strengths throughout the bilateral upper  extremities with decreased bulk and decreased range of motion.   Lower  extremity exam showed hip flexion, knee extension, and ankle  dorsiflexion at 3+ to 4-/5.   IMPRESSION:  1. Status post cervical C1-C2 laminectomy secondary to cervical      stenosis with myelopathy.  2. History of rheumatoid arthritis with several joint replacements.  3. Moderate to severe lumbar spondylolisthesis of the lumbar spine at      L3 and L4.  4. Hypertension.  5. Coronary artery disease with recent stenting of the left anterior      descending.  6. Status post fall with recent right elbow fracture, conservative      care with improvement.   In the office today, we did refill the patient's OxyContin sustained-  release and her Percocet each as of November 19, 2008.  We will plan on  seeing her in followup in 3 months' time.  She continues to be very  compliant with medication without signs of any diversion, whatsoever  with good benefit without significant side effects.  We will plan on  seeing the patient in followup as noted above.           ______________________________  [name], M.D.     DC/MedQ  D:  11/19/2008 12:36:52  T:  11/20/2008 02:18:11  Job #:  [id number]

## 2011-03-20 NOTE — Assessment & Plan Note (Signed)
Angela [name] is back regarding her rheumatoid arthritis and spondylosis in  the cervical lumbar spine.  Apparently, she broke her elbow over the  last 2 months.  She had received some more Percocet through Dr. Thomasena Edis  to help cover her breakthrough pain.  Pain has been subsiding somewhat.  Her elbow was managed conservatively.  Her pain is 5-6/10, described as  dull and aching.  Pain interferes with general activity, relations with  others, enjoyment of life on a moderate-to-severe level.  Sleep is  generally good at this point.   REVIEW OF SYSTEMS:  Notable for anxiety, weight loss.  She had a CT scan  of the abdomen recently which was negative.  She reports night sweats  and poor appetite.  Other pertinent positives are above, and full review  is in the written health and history section of the chart.   SOCIAL HISTORY:  The patient is married, living with her husband who is  with her today and supportive.   PHYSICAL EXAMINATION:  Blood pressure is 106/66, pulse 56, respiratory  rate 18.  She is sating 94% on room air.  The patient is pleasant,  alert, and oriented x3.  She is rather frail appearing.  She has limited  range of motion in all joints.  The right elbow is frozen and  essentially 30-40 degrees of flexion.  I tried to range it passively and  she had some crepitus present at the elbow with some associated pain.  Strength is generally 3-4/5 throughout.  Cognitively, she is very  appropriate.  She does lack a bit of insight and awareness at times.  Heart is regular.  Chest is clear.  Abdomen is soft, nontender.   ASSESSMENT:  1. Cervical stenosis, myelopathy status post C1-C2 laminectomy.  2. History of rheumatoid arthritis.  3. Severe lumbar spondylolisthesis at L3-L4.  4. Hypertension.  5. Coronary artery disease.  6. Recent elbow fracture on the right.   PLAN:  1. We have discussed other options for long-acting pain management      today.  We will stay with OxyContin  CR 20 mg 1 q.12 hours #30.  2. We will reduce Percocet back to 5 per day 7.5/325 dose.  Goal would      be to get to 3-4 a day down the line.  3. We will add patches 2 twice a day to hips for possible bursitis.      She can use these in addition to the Lidoderm patches.  The patient      was given samples of these today.  4. We will see her back in about 5 weeks in nursing clinic, in 9 weeks      with me.      Ranelle Oyster, M.D.  Electronically Signed     ZTS/MedQ  D:  02/14/2009 13:26:06  T:  02/15/2009 03:14:59  Job #:  [id number]

## 2011-03-20 NOTE — Assessment & Plan Note (Signed)
Angela [name] is back regarding her rheumatoid arthritis-associated pain.  She  notes some increased pain here and there over her right hip.  She is  still on her schedule of 5 Percocet a day with OxyContin CR 20 mg at  night.  She uses Lidoderm patches successfully for her back and hips  when pain flares.  Pain is described as aching.  Pain interferes with  general activity, relations with others, enjoyment of life on a mild-to-  moderate level.   REVIEW OF SYSTEMS:  Notable for some anxiety.  Other pertinent positives  are above and full review is in the written health and history section  of the chart.   SOCIAL HISTORY:  Unchanged.  Husband is with her today again.   PHYSICAL EXAMINATION:  Blood pressure 127/57, pulse 78, respiratory rate  18.  She is sating 98% on room air.  The patient is pleasant, alert and  oriented x3.  She is brighter today on the whole.  She has some pain in  the right greater trochanter region with palpation.  The back is  somewhat tender with palpation and flexion greater than extension today.  Elbow remains limited at range of motion once again.  Strength  throughout is 3-4/5.  She has a few bruises and areas of small skin  breakdown.  Cognitively, she is appropriate with fair insight and  awareness.  Heart is regular.  Chest is clear.  Abdomen is soft,  nontender.   ASSESSMENT:  1. Cervical stenosis with myelopathy status post C1-C2 laminectomy.  2. History rheumatoid arthritis.  3. Severe lumbar spondylolisthesis at L3-L4.  4. Hypertension.  5. Coronary artery disease.  6. Recent elbow fracture on the right.   PLAN:  1. We will switch her OxyContin from bedtime to 20 mg q.12 h. and back      off her Percocet 3-4 times a day with goal of reducing to 2 per day      ultimately.  I gave her 60 Oxy CR 20 mg tabs as well as 127.5      Percocet today.  2. Encouraged use of Lidoderm patches to hips and low back.  3. I gave the patient 2 samples of topical  diclofenac, one is the      Voltaren gel and one is Pennsaid.  She will use this on a limited      basis to see if it provides any relief topically to the hips and      back.  4. We will see her back in 3 months with me, 1 month in nursing.      Ranelle Oyster, M.D.  Electronically Signed     ZTS/MedQ  D:  05/13/2009 13:34:05  T:  05/14/2009 02:27:59  Job #:  [id number]

## 2011-03-20 NOTE — Assessment & Plan Note (Signed)
Angela [name] returns to the clinic today accompanied by her husband. The  patient has been experiencing diarrhea for 2 weeks. She apparently had  lab work and a urine sample tested by Dr. Valentina Lucks and was told she had a  urinary tract infection. She was placed on double antibiotics and  reports that her diarrhea was better for a couple of days but then has  worsened. She had stool samples that she is planning to take into the  doctor's office today for testing.   The patient does report poor sleep recently using the lorazepam 1 mg.  She would like to have that dose adjusted. She does report that she  needs refills on her Percocet and OxyContin in the office today. She  feels that she is not getting quite as much relief from her Percocet use  5/325 one tablet q.i.d. She also requests a refill on her Advair.   CURRENT MEDICATIONS:  1. Prednisone 5 mg.  2. Prilosec 20 mg daily p.r.n.  3. Lasix 20 mg daily.  4. K-Dur 1 tablet daily.  5. Methotrexate 2.5 mg 3 tablets weekly.  6. OxyContin sustained release 20 mg q.h.s.  7. Flexeril 10 mg 1-2 tablets p.o. daily p.r.n.  8. Centrum Cardio 1 tablet daily.  9. Calcium 1000 mg daily.  10.Allegra 80 mg daily.  11.Zocor 40 mg daily.  12.Combivent metered dose inhaler 2 puffs q.i.d.  13.Actonel 35 mg q. week.  14.Lorazepam q.h.s.  15.Percocet 5/325 one tablet q.i.d. p.r.n.  16.Lidoderm patches p.r.n.  17.Folic acid 800 mcg daily.  18.Advair 50/250 one puff b.i.d. p.r.n.   REVIEW OF SYSTEMS:  Positive for weight loss, diarrhea, poor appetite  and wheezing.   PHYSICAL EXAMINATION:  GENERAL:  A well-appearing, middle-age, adult  female in mild acute discomfort.  VITAL SIGNS:  Blood pressure 121/62 with a pulse of 87, respiratory rate  16 and O2 saturation 95% on room air.  The patient has 4/5 strength throughout the bilateral upper and lower  extremities.   IMPRESSION:  1. Status post C1-C2 laminectomy secondary to cervical stenosis with    myelopathy.  2. History of rheumatoid arthritis with several joint replacements.  3. Moderate to severe lumbar spondylolisthesis at L3 on L4 and L4 on      L5.  4. Hypertension.  5. Coronary artery disease with recent stenting of left anterior      descending.   In the office today, we did refill the patient's Percocet at a 7.5/325  dose which is actually a slight increase from her present medicine. Also  we filled her OxyContin extended release and increased her lorazepam to  2 mg p.o. q.h.s. Dr. Newell Coral has started her on Neurontin at 300 mg  t.i.d. No refill was necessary on that medication. We did refill her  Advair inhaler. We will plan on seeing her in followup in approximately  3 months' time with refills prior to that appointment as necessary.           ______________________________  [name], M.D.     DC/MedQ  D:  05/12/2007 16:34:01  T:  05/13/2007 07:48:14  Job #:  [id number]

## 2011-03-20 NOTE — Assessment & Plan Note (Signed)
DATE OF EVALUATION:  January 01, 2008   Ms. Dalto returns to clinic today accompanied by her husband.  Overall  she is doing well.  She continues to use her OxyContin 20 mg at night  and her Percocet generally four times per day.  She is requesting that  we write a note to her insurance company to try to get Lidocaine patches  approved for her.  They apparently are giving her a lot to trouble, and  she has appealed at least once without satisfactory finding.  She  certainly benefits form the Lidocaine patches, but they are very  expensive for her to try to pay out-of-pocket to cover.  Ideally, she  would use the Lidocaine patches and hopefully decrease her use of  narcotics, and that would benefit her in the long term.   MEDICATIONS:  1. Nexium 40 mg b.i.d.  2. Toprol XL 25 mg daily.  3. Prednisone 5 mg daily.  4. Lasix 20 mg daily.  5. Zocor 40 mg daily.  6. Potassium chloride 20 mEq daily.  7. Oxycodone 5/325 one tablet q.i.d. p.r.n.  8. OxyContin sustained release 20 mg at night.  9. Flexeril 10 mg p.r.n.  10.Methotrexate 2.5 mg 3 tablets per week.  11.Allegra 180 mg daily.  12.Lorazepam 1 mg at night.  13.Nasonex two sprays each nostril daily.  14.Combivent two puffs q.i.d.  15.Singulair 10 mg daily.  16.Calcium with vitamin D 500 mg t.i.d.  17.Actonel 35 mg q. Week.  18.Gabapentin 300 mg t.i.d.  19.Plavix 35 mg daily.  20.Aspirin 81 mg daily.   REVIEW OF SYSTEMS:  Noncontributory.   PHYSICAL EXAMINATION:  Reasonably well appearing, adult female seated in  a manual wheelchair with obvious deformities of her elbows, wrists, and  fingers.  Blood pressure is 133/61 with a pulse of 84, respiratory rate  18, and O2 saturation 92-94% on room air.  She has 4-/5 strength  throughout the bilateral upper extremities with significant deformities  as noted.  She also has significantly decreased cervical range of  motion.   IMPRESSION:  1. Status post C1-C2 laminectomy  secondary to cervical stenosis with      myelopathy.  2. History of rheumatoid arthritidis with several joint replacements.  3. Moderate to severe lumbar spondylolisthesis at L3-4 and L4-5.  4. Hypertension.  5. Coronary artery disease with recent stenting of left anterior      descending.   In the office today, we did refill the patient's Percocet and her  OxyContin.  We also gave her a copy of a note that they can send off to  the insurance company to try to appeal the initial decision not to cover  the Lidocaine patches.  She certainly would benefit from those patches,  and in fact, we have given her a few samples in the office today. We  will plan on seeing her in followup in approximately three to four  months' time with refills prior to that appointment as necessary.           ______________________________  [name], M.D.     DC/MedQ  D:  01/02/2008 10:51:06  T:  01/02/2008 18:02:41  Job #:  [id number]

## 2011-03-23 NOTE — Discharge Summary (Signed)
NAME:  Angela [name], Angela [name]                ACCOUNT NO.:  [id number]   MEDICAL RECORD NO.:  [id number]          PATIENT TYPE:  INP   LOCATION:  6533                         FACILITY:  [facility]   PHYSICIAN:  Armanda Magic, M.D.     DATE OF BIRTH:  1936/06/20   DATE OF ADMISSION:  12/10/2006  DATE OF DISCHARGE:  12/12/2006                               DISCHARGE SUMMARY   DISCHARGE DIAGNOSES:  1. Coronary artery disease status post bare metal stent to the left      anterior descending artery.  2. Rheumatoid arthritis.  3. Hypertension, treated.  4. Hyperlipidemia, treated.  5. Asthma, treated.  6. Long-term medication use.  7. Depression.  8. Reflux.  9. Obesity.  10.Multiple orthopedic surgeries, including spinal fusion, bilateral      and total knee replacement, bilateral ankle fusion.  11.Allergy to shrimp, codeine, and peanuts.   HOSPITAL COURSE:  Angela [name] is a 75 year old female who has no coronary  artery disease.  Cardiac catheterization in 2005 showed an ejection  fraction of 45% with a 40% LAD lesion.  She was medically managed.  She  tells me she has chronic angina but on Saturday prior to admission she  began having substernal chest pain and jaw pain at rest.  She was  admitted to [facility] and underwent cardiac catheterization on  December 11, 2006.  She was found to have a 30% LAD lesion followed by  80% mid lesion.  RCA was widely patent as well  circumflex.  Dr.  Eldridge Dace proceeded to perform an IVUS of the mid LAD and the patient  ultimately received bare metal stent placement to the mid LAD.  In the  past she has had stents to the proximal LAD.  This IVUS showed these  were not well placed. The stent placed on December 11, 2006, was a bare  metal stent.  Therefore, the patient will need to remain on Plavix and  aspirin for at least 30 days.   LABORATORY STUDIES ON DISCHARGE:  BUN 12, creatinine 0.7, potassium 4.6,  hemoglobin 14.1, hematocrit 41, blood count 3.8  (patient on long-term  prednisone).   DISCHARGE DIAGNOSES:  She is to continue her home medications which  include:  1. Toprol-XL 25 mg a day.  2. Nexium 40 mg b.i.d.  3. Prednisone 5 mg a day.  4. Lasix 20 mg a day.  5. Potassium 20 mEq a day.  6. Zocor 40 mg a day.  7. Calcium daily.  8. Celebrex 200 mg twice a day.  9. Combivent 2 puffs 4x a day.  10.Singulair 10 mg a day.  11.Actonel weekly.  12.Oxycodone and Flexeril p.r.n.  13.Methotrexate weekly.  14.Lorazepam p.r.n.  15.Nasonex daily.  16.Allegra daily.   New medications:  1. Plavix 75 mg a day.  2. Enteric-coated aspirin 325 mg a day.   She is to clean the cath area gently with soap and water.  The area has  significant bruising.  There is no bruit present and the area is  nontender.  For this reason, she will have early followup with Dr.  Turner on December 20, 2006, at 11:45 a.m.  She is not to lift over 10  pounds for one week.  No driving for a couple  of days.  May shower or  bathe, and increase in activities slowly.  She is to call for any  questions or concerns.  I did write her a prescription for sublingual  nitroglycerin and gave her instructions on how to use this medication.      Guy Franco, P.A.      Armanda Magic, M.D.  Electronically Signed    LB/MEDQ  D:  12/12/2006  T:  12/13/2006  Job:  [id number]

## 2011-03-23 NOTE — Assessment & Plan Note (Signed)
Angela [name] returns to the clinic today for follow-up evaluation accompanied  by her husband.  The patient has been back to see Dr. Newell Coral.  He has  recommended continuation of the soft collar and has reviewed a recent lumbar  MRI scan.  She is seven months out from her cervical fusion with good  alignment noted.  Dr. Newell Coral is now turning his attention to her lumbar  spine, where she has apparently some significant degenerative changes.  He  has recommended an epidural steroid injection for the patient and she  underwent her first one yesterday with Dr. Barrington Ellison.  She reports that she  slept better last night than she has in a while.  She reports that Dr.  Barrington Ellison is planning the next injection June 05, 2004.  The patient is  planning to go to the beach for a short period of time and would like a  refill on her cyclobenzaprine and OxyContin medications in the office today.   The patient has recently also had an evaluation by an ear, nose and throat  specialist, Dr. Pollyann Kennedy.  She was told that she had vocal cord inflammation  and reflux disease.  He recommended that she stop caffeine and chocolate and  she reports that she has had a substantial improvement in her symptoms.   MEDICATIONS:  1. Celebrex 200 mg two tablets q. day.  2. Prednisone 5 mg q. day.  3. Prilosec 20 mg p.r.n.  4. Lasix 20 mg q. day.  5. K-Dur one tablet q. day.  6. Methotrexate 2.5 mg three tablets weekly.  7. OxyContin 20 mg t.i.d.  8. Cyclobenzaprine 10 mg one to two tablets q. day p.r.n.  9. Calcium 1000 mg q. day.  10.      Allegra 180 mg q. day.  11.      Zocor 40 mg q. day.  12.      Combivent two puffs q.i.d.  13.      Actonel 35 mg q. week.  14.      Lorazepam 1 mg q.h.s. p.r.n.  15.      Nasonex one squirt in each nostril q.h.s.   PHYSICAL EXAMINATION:  GENERAL:  Well-appearing, well-nourished, adult  female.  VITAL SIGNS:  Blood pressure 123/62 with a pulse of 87; respiratory rate 20;  O2  saturation 95% on room air.  EXTREMITIES:  She has 4+/5 strength in her bilateral upper extremities.  Bulk and tone were normal.  She has obvious deformities of her bilateral  hand, secondary to rheumatoid arthritis.  Bilateral lower extremity exam  showed hip flexion and knee extension at 4 to 4+/5.  She ambulates with a  walker in the office today.   IMPRESSION:  1. Status post C1-2 laminectomy, secondary to cervical stenosis with     myelopathy.  2. History of rheumatoid arthritis with several joint replacements in the     past.  3. Urinary retention, improved.  4. Hypertension.   At the present time I have refilled her cyclobenzaprine and OxyContin  medications as noted above.  We will plan on seeing the patient in follow-up  in approximately three months time.  She is undergoing treatment for a  lumbar degenerative disc disease with Dr. Newell Coral and Dr. Barrington Ellison at the  present time.  The most recent epidural steroid injection has given her good  relief overall.  She continues to take OxyContin for overall pain and is  hopeful that will be able to be weaned as her  lumbar pain is dealt with  either surgically or with injection.   PLAN:  I will see the patient in follow-up as noted.      [name], M.D.   DC/MedQ  D:  05/17/2004 16:58:44  T:  05/17/2004 18:25:52  Job #:  [id number]

## 2011-03-23 NOTE — Assessment & Plan Note (Signed)
HISTORY:  [name] returns to clinic today for followup evaluation.  She  is accompanied by her husband.  The patient reports that she did have a fall  New Years Day November 05, 2004 when she was moving too fast in comparison to  her walker which was in front of her.  She apparently fell but bruised her  right arm on the walker itself.  She did apparently hit her chin on the  floor but fortunately the floor was carpeted.  She did see Dr. Newell Coral this  past Monday and x-rays of her neck were reportedly good.  The patient did  not seek an emergency room visit during the initial fall.  Dr. Newell Coral is  not planning to see her in followup for her neck for approximately 6 months'  time.  She did have a recent MRI scan of her lumbar spine but Dr. Newell Coral  is hesitant to do any surgery.  He explained to her that the surgery would  be rather extensive.   The patient reports that she has really had no neck pain in several months'  time.  Most of the pain that she is experiencing at present is in her low  back.  She is getting good relief from the combination of the Flexeril,  OxyContin and Percocet medications as noted below.  She also uses lorazepam  either once or twice a day and that gives her good benefit in terms of  sleep.  She has stopped the Celebrex completely.   MEDICATIONS:  1.  Prednisone 5 mg once daily.  2.  Prilosec 20 mg once daily p.r.n.  3.  Lasix 20 mg once daily.  4.  K-Dur one tablet once daily.  5.  Methotrexate 2.5 mg three tablets weekly.  6.  OxyContin 20 mg one tablet t.i.d. p.r.n.  7.  Flexeril 10 mg one to two tablets once daily p.r.n.  8.  Calcium 1000 mg once daily.  9.  Allegra 180 mg once daily.  10. Zocor 40 mg once daily.  11. Combivent two puffs q.i.d.  12. Actonel 35 mg every week.  13. Lorazepam 1 mg at bedtime p.r.n.  14. Percocet 5/325 one tablet b.i.d. p.r.n.   PHYSICAL EXAMINATION:  Reasonably well-appearing adult female with mild  bruising of  her right upper arm.  The bruising has faded according to the  patient over the past couple weeks.  She continues to have severe  deformities of all joints, especially the bilateral upper extremities at the  elbows and bilateral hands.  She has at least 4/5 strength in the bilateral  upper extremities and ambulates in the office with a rolling walker.   IMPRESSION:  1.  Status post C1-2 laminectomy secondary to cervical stenosis with      myelopathy.  2.  History of rheumatoid arthritis with several joint replacements in the      past.  3.  Hypertension.   In the office today patient reports that she is doing well.  She continues  to report good relief with the medications.  Her husband manages those for  her and she certainly does not overuse them.  She generally uses the  OxyContin no more than twice a day.  She also uses a maximum of two Percocet  a day.  She asks about addiction but she has been told by myself and by Dr.  Phylliss Bob that she is not at risk for addiction.  She does have chronic pain  related to  her severe rheumatoid arthritis and the pain medicines are being  very helpful for that.  We did refill the three medications,  specifically the Flexeril and Percocet as of today and the OxyContin as of  Monday, November 20, 2004.  We will plan on seeing the patient in followup in  approximately 4 months' time.       DC/MedQ  D:  11/15/2004 16:17:43  T:  11/15/2004 18:22:32  Job #:  [id number]

## 2011-03-23 NOTE — Assessment & Plan Note (Signed)
Ms. Wahba returns to clinic today for followup evaluation.  She reports  that she had been receiving acupuncture for approximately 21 treatments over  6 weeks with Dr. Bryson Ha in [place].  That gave her good benefit.  She has  been without the acupuncture for several weeks now and reports that her pain  is worse.  She is in the process of trying to set up acupuncture treatments  through her primary care physician at [facility],  hopefully with Dr. Ricki Miller.   In terms of her pain medicines, she is not using the Duragesic patch nor the  Lidoderm at this time.  She is using the Percocet 5/325 1 tablets 3-4 times  per day.  She also uses the OxyContin generally 20 mg at bedtime.  She uses  the Celebrex and Flexeril as noted below.  She does need refills on each of  those in the office today.  She reports that most of her pain now is worse  especially in the evening.   The patient continues to be followed by her primary care physician Dr.  Olena Leatherwood.   MEDICATIONS:  1. Prednisone 5 mg daily.  2. Prilosec 20 mg daily p.r.n.  3. Lasix 20 mg daily.  4. K-Dur 1 tablet daily.  5. Methotrexate 2.5 mg 3 tablets weekly.  6. OxyContin SR 20 mg q.h.s.  7. Flexeril 10 mg 1-2 tablets p.o. daily p.r.n.  8. Calcium 1000 mg daily.  9. Allegra 80 mg daily.  10.Zocor 40 daily.  11.Combivent metered dose inhaler 2 puffs q.i.d.  12.Actonel 35 mg weekly.  13.Lorazepam 1 mg q.h.s.  14.Percocet 5/325 1 tablet q.i.d. p.r.n.  15.Celebrex 200 mg b.i.d.   REVIEW OF SYSTEMS:  Noncontributory.   PHYSICAL EXAMINATION:  Reasonably well-appearing, elderly adult female  sitting in her regular wheelchair.  She has significant deformities in her  bilateral hands and decreased range of motion of her cervical and lumbar  spine.  Blood pressure is 113/80 with a pulse of 89, respiratory rate 16, O2  saturation 96% on room air.  She has poor -/5 strength in the bilateral  lower extremities and poor -/5  strength of the bilateral upper extremities.   IMPRESSION:  1. Status post C1-C2 laminectomy secondary to cervical stenosis with      myelopathy.  2. History of rheumatoid arthritis with several joint replacements.  3. Moderate to severe lumbar spondylodesis of the L3 on 4 and L4 on 5.  4. Hypertension.   In the office today we did refill the patient's OxyContin SR along with her  Percocet, each for one month's supply.  We refilled her Celebrex and  Flexeril for a 59-month supply.  She will hopefully be seeing Dr. Ricki Miller for  restart of acupuncture locally.  She was getting good benefit from the  acupuncture done down in [place] but the drive is  too significant for her to make on a regular basis.  She is trying to get  something more geographically convenient.  We will plan on seeing her in  followup as noted above.           ______________________________  [name], M.D.     DC/MedQ  D:  08/02/2006 14:23:21  T:  08/04/2006 21:51:18  Job #:  [id number]

## 2011-03-23 NOTE — Cardiovascular Report (Signed)
NAME:  Angela [name], Angela [name]                ACCOUNT NO.:  [id number]   MEDICAL RECORD NO.:  [id number]          PATIENT TYPE:  INP   LOCATION:  6533                         FACILITY:  [facility]   PHYSICIAN:  Corky Crafts, MDDATE OF BIRTH:  [birth year]   DATE OF PROCEDURE:  DATE OF DISCHARGE:                            CARDIAC CATHETERIZATION   PROCEDURES PERFORMED:  Intravascular ultrasound of the LAD, percutaneous  coronary intervention of the LAD.   OPERATORS:  Corky Crafts, MD   INDICATIONS FOR PROCEDURE:  Unstable angina.   PROCEDURE NARRATIVE:  The diagnostic catheterization was done by Dr.  Mayford Knife.  This revealed an eccentric 50% stenosis of the mid LAD, she  requested an IVUS ACLS 3.5 guiding catheter was used to engage the  ostium of the left main.  A Prowater wire was placed down the mid LAD.  The IVUS catheter was then inserted and the IVUS was performed.  There  was an eccentric 60% stenosis with a cross sectional area of the mid LAD  measuring 3.1 mm squared.  Given her symptoms, a decision was made to  perform a PCI.  Integrilin was started in additional to the heparin  which was already given.  A BMW wire was placed into the second  diagonal.  A 30 x 12 Liberte stent was then deployed across the stenosis  to 12 atmospheres for 39 seconds.  Another IVUS run was performed  showing some struts in the proximal stent which were not well opposed.  Subsequently a 3.25 x 8 mm quantum balloon was used to post dilate the  proximal portion of the stent.  It was inflated to 60 atmospheres for 22  seconds.  There is an excellent angiograph result with no residual  stenosis and TIMI-III flow.   IMPRESSION:  Successful bare metal placement to the mid LAD with a 30 x  12 Liberte stent post dilated with a 3.25 mm balloon.   RECOMMENDATIONS:  The patient should continue with aspirin and Plavix  for at least 30 days.  If not longer.  She should also continue with  aggressive risk  factor modification.  She will be monitored overnight.  An Angio-Seal was deployed for hemostasis and for her comfort.      Corky Crafts, MD  Electronically Signed     JSV/MEDQ  D:  12/11/2006  T:  12/12/2006  Job:  [id number]

## 2011-03-23 NOTE — Assessment & Plan Note (Signed)
INTERVAL HISTORY:  Angela [name] returns to clinic today for followup  evaluation.  Unfortunately, her Lidoderm patches have not been approved by  her insurance carrier.  They are apparently willing to only cover it for  postherpetic neuralgia.  She is using it for chronic pain related to her  degenerative disk disease and rheumatoid arthritis.   The patient did have a fall in middle of January 2007.  She apparently fell  in her bathroom when she tripped on a throw rug.  She landed on her face and  was evaluated by Dr. Newell Coral, her neurosurgeon.  Fortunately, no  abnormalities of her cervical spine were found.  She also had to follow up  with Dr. Priscille Kluver, her orthopedist, and again no abnormalities of her knees  were identified.  She was released home with home health physical therapy  for ambulation and balance.  They have finished at this time.  The patient  still gets help in and out of the shower by her husband.   The patient has been taking OxyContin CR 20 mg three times a day along with  Percocet 5/325 one tablet twice a day.  She reports that she gets  occasionally foggy related to the OxyContin as best she can tell.  She would  like to decrease her OxyContin down to once a day and use slightly increased  amounts of Percocet as needed for breakthrough pain.   MEDICATIONS:  1.  Prednisone 5 mg daily.  2.  Prilosec 20 mg daily p.r.n.  3.  Lasix 20 mg daily.  4.  K-Dur one tablet daily.  5.  Methotrexate 2.5 mg three tablets weekly.  6.  OxyContin CR 20 mg one tablet t.i.d.  7.  Flexeril 10 mg one to two tablets p.o. daily p.r.n.  8.  Calcium 100 mg daily.  9.  Allegra 180 mg daily.  10. Zocor 40 mg daily.  11. Combivent two puffs q.i.d.  12. Actonel 35 mg weekly.  13. Lorazepam 1 mg at bedtime p.r.n.  14. Percocet 5/325 one tablet b.i.d. p.r.n.  15. Celebrex 200 mg b.i.d.  16. Lidoderm patches 5% on 12 offs then off 12 hours daily.   REVIEW OF SYSTEMS:  Noncontributory.   PHYSICAL EXAMINATION:  Reasonably well-appearing elderly adult female seated  in a power wheelchair.  Blood pressure 120/46 with a pulse of 72,  respiratory rate 17, and O2 saturation 99% on room air.  She has  approximately 4/5 strength throughout the bilateral upper and lower  extremities.   IMPRESSION:  1.  Status post C1-2 laminectomy secondary to cervical stenosis with      myelopathy.  2.  History of rheumatoid arthritis with several joint replacements.  3.  Moderate to severe lumbar spondylolisthesis of L3 on L4 and L4 on L5.  4.  Hypertension.   In the office today we did give her a new prescription for Percocet 5/325 to  be used one tablet q.i.d. p.r.n. dated for February 14, 2006.  She will be  using the Percocet up to four times a day and has been instructed to  decrease her OxyContin CR down to 20 mg daily from three times a day.  That  hopefully will allow her to clear mentally and still give her adequate pain  relief using the Percocet on a slightly increased frequency.  We will plan  on seeing her in followup in approximately 3 months' time.  She and her  husband will call in if the pain  control on this new regimen is not  adequate.  We will make adjustments from there as necessary.           ______________________________  [name], M.D.     DC/MedQ  D:  01/30/2006 15:50:29  T:  02/01/2006 00:17:58  Job #:  [id number]

## 2011-03-23 NOTE — Assessment & Plan Note (Signed)
INTERVAL HISTORY:  [name] returns to clinic today for followup  evaluation.  She has undergone successful laparoscopic cholecystectomy  July 13, 2005 by Dr. Derrell Lolling.  She reports that she tries to watch what  foods she eats and eats a smaller amount of food at a time.  She has been  told that the largest stone was 4 mm and that there was no sign of cancer on  biopsy results.   The patient reports that she has had bad pain in her left buttock region and  up over her lumbar region.  She would like to have repeat epidural steroid  injections which she has had with Dr. Barrington Ellison at [facility], the  last occurring in July and August 2005.  She reports that she gained some  relief with those injections at that time.  She does have recent plain films  of her lumbar spine dated from Mar 05, 2005 that shows severe degenerative  disk disease at L3-4 and L4-5 with anterolisthesis of L3 on L4 by 7 mm and  L4 on L5 by 7 mm.  No acute abnormality was identified.   MEDICATIONS:  1.  Prednisone 5 mg daily.  2.  Prilosec 20 mg daily p.r.n.  3.  Lasix 20 mg daily.  4.  K-Dur one tablet daily.  5.  Methotrexate 2.5 mg three tablets weekly.  6.  OxyContin CR 20 mg one tablet t.i.d. p.r.n.  7.  Flexeril 10 mg one to two tablets p.o. daily p.r.n.  8.  Calcium 1000 mg daily.  9.  Allegra 180 mg daily.  10. Zocor 40 mg daily.  11. Combivent two puffs q.i.d.  12. Actonel 35 mg weekly.  13. Lorazepam 1 mg at bedtime p.r.n.  14. Percocet 5/325 one tablet twice a day p.r.n.   PHYSICAL EXAMINATION:  Ill-appearing overweight adult female in moderate  acute discomfort.  Blood pressure was 102/56 with a pulse of 73, respiratory  rate of 16 and O2 saturation 96% on room air.  She has approximately 4/5  strength throughout the bilateral upper and lower extremities.  She is able  to stand but does not take many steps.  She uses a walker and mostly gets  around by wheelchair at this point.   IMPRESSION:  1.  Status post C1-2 laminectomy secondary to cervical stenosis with      myelopathy.  2.  History of rheumatoid arthritis with several joint replacements in the      past.  3.  Moderate to severe lumbar spondylolisthesis of L3 on L4 and L4 on L5.  4.  Hypertension.  5.  Status post laparoscopic cholecystectomy.   At the present time encouraged to use her pain medicine as necessary.  We  have refilled her OxyContin, Flexeril and Percocet in the office today.  We  have also given her a prescription for a lumbar epidural steroid injection  x3 to be done with Dr. Barrington Ellison at [facility].  We have also given her  a slip for a lumbar corset brace to be obtained through [employer].  This  hopefully would give her some benefit in terms of supporting her abdomen  with the significant degenerative disk disease and spondylolisthesis.  We  will plan on seeing the patient in followup in approximately 3 months' time.  We also refilled her Toprol and prednisone in the office today.  She will be  continuing with her primary care physician.  She is interested in seeing a  cardiologist and  may chose to see Dr. Donnie Aho.           ______________________________  [name], M.D.     DC/MedQ  D:  08/13/2005 16:21:01  T:  08/13/2005 18:58:24  Job #:  [id number]

## 2011-03-23 NOTE — Assessment & Plan Note (Signed)
INTERVAL HISTORY:  [name] returns to clinic today for followup  evaluation.  She unfortunately lost her mother to severe pneumonia  approximately December 25, 2004.  The lady was 34-1/73 years old and the  patient is still trying to recover although feels that they made the proper  decision regarding her end-of-life issues.   In terms of her pain she still reports a fair amount of pain in her right  buttock region.  She understands that this is probably a combination of her  low back problems and her hip joint problems.  Apparently she had been told  that she had hip joint arthritis present at least a year ago by Dr. Turner Daniels.  She has not followed up with that diagnosis or seen Dr. Turner Daniels since that  time.   Apparently the patient has seen Dr. Phylliss Bob approximately 2 months and he is  her usual rheumatologist.  Apparently she had improved in terms of her bone  densitometry study and will be using Actonel.   The patient is interested in getting a second opinion regarding newer  rheumatoid arthritis medications.  She feels she may want to try Dr.  Kellie Simmering, one of the other local rheumatologists.   She also has questions regarding her back and the surgery that Dr. Newell Coral  has suggested for her but is reluctant to perform given her overall health.   MEDICATIONS:  1.  Prednisone 5 mg once daily.  2.  Prilosec 20 mg once daily p.r.n.  3.  Lasix 20 mg once daily.  4.  K-Dur one tablet once daily.  5.  Methotrexate 2.5 mg three tablets weekly.  6.  OxyContin CR 20 mg one tablet t.i.d. p.r.n.  7.  Flexeril 10 mg one to two tablets p.o. once daily p.r.n.  8.  Calcium 1000 mg once daily.  9.  Allegra 180 mg once daily.  10. Zocor 40 mg once daily.  11. Combivent two puffs q.i.d.  12. Actonel 35 mg every week.  13. Lorazepam 1 mg at bedtime p.r.n.  14. Percocet 5/325 one tablet b.i.d. p.r.n.   PHYSICAL EXAMINATION:  Somewhat sad-appearing adult female with significant  arthritic changes  of her hands and arms.  Patient has vitals showing blood  pressure of 35/71 with a pulse of 87, respiratory rate 16 and O2 saturation  97% on room air.  She continues to have deformities of all joints with  limited range of motion of shoulders, elbows and hand joints.  She has  strength of at least 4/5 throughout the bilateral upper and lower  extremities and ambulates with a walker in a slow purposeful manner.   IMPRESSION:  1.  Status post C1-2 laminectomy secondary to cervical stenosis with      myelopathy.  2.  History of rheumatoid arthritis with several joint replacements in the      past.  3.  Hypertension.   In the office today the patient did express a desire to seek a second  rheumatological opinion and would like to see Dr. Kellie Simmering.  We have tried to  refer her to him through the office today.  We have also refilled her  OxyContin as of February 25, 2005 and her Percocet as of today.  She is  interested in also seeing Dr. Turner Daniels again to see what up-to-date x-rays may  show regarding her right hip joint.  That certainly seems reasonable given  that her back pain is probably a combination of the lumbar degenerative disk  disease and probably arthritic changes of her right hip joint.  Any surgery  either lumbar surgery or hip surgery would probably be difficult and  potentially with co-morbid difficulties given her overall health.   We will plan on seeing the patient in followup in this office in  approximately 1-2 months' time with refills of medication prior to that  appointment if necessary.      DC/MedQ  D:  02/14/2005 16:56:22  T:  02/14/2005 19:30:19  Job #:  [id number]

## 2011-03-23 NOTE — H&P (Signed)
NAME:  BRINAE, WOODS                ACCOUNT NO.:  [id number]   MEDICAL RECORD NO.:  [id number]          PATIENT TYPE:  INP   LOCATION:  3705                         FACILITY:  [facility]   PHYSICIAN:  Armanda Magic, M.D.     DATE OF BIRTH:  03/31/1936   DATE OF ADMISSION:  12/10/2006  DATE OF DISCHARGE:                              HISTORY & PHYSICAL   REASON FOR ADMISSION:  Chest pain.   Angela [name] is a 75 year old female with a known history of coronary  artery disease.  She has had a positive Cardiolite in the past and  cardiac catheterization in 2005 showed an ejection fraction of 45% with  a 40% LAD lesion.  She was medically managed at that point.  She now  tells me she has had chronic angina but on Saturday began having  substernal chest pain with jaw pain even at rest.  She has had no other  constitutional symptoms.  She denies any palpitations, PND, orthopnea,  dizziness or syncope.  She is now being admitted with angina for cardiac  catheterization.   PAST MEDICAL HISTORY:  1. CAD, as above.  2. Rheumatoid arthritis.  3. Asthma.  4. Depression.  5. Hyperlipidemia.  6. Reflux.  7. Hypertension.  8. Obesity.  9. Long term medication use.   She has undergone a cholecystectomy, spinal fusion, bilateral total knee  replacement, bilateral ankle fusion.   ALLERGIES:  1. SHRIMP.  2. CODEINE.  3. PEANUTS.   MEDICATIONS:  1. Nexium 40 mg b.i.d.  2. Toprol-XL 25 mg a day.  3. Prednisone 5 mg a day.  4. Lasix 20 mg a day.  5. Potassium 20 mEq a day.  6. Zocor 40 mg a day.  7. Calcium daily.  8. Actonel weekly.  9. Celebrex 200 mg b.i.d.  10.Oxycodone 5/325 mg t.i.d. p.r.n.  11.Flexeril 10 mg t.i.d. p.r.n.  12.Methotrexate 2.5 mg three tablets weekly.  13.Allegra 180 mg a day.  14.Lorazepam 1 mg q.h.s.  15.Nasonex daily.  16.Combivent 2 puffs q.i.d.  17.Singulair 10 mg a day.   SOCIAL HISTORY:  Married, lives in [place].  No tobacco, alcohol  or illicit  drug use.   FAMILY HISTORY:  Mom died at age 73 of old age.  Dad died at age 2 of  an MVA.   PHYSICAL EXAM:  Weight 212, pulse 68, respirations 16, blood pressure  130/86.  HEENT:  Grossly normal.  No carotid or subclavian bruits.  No JVD, no  thyromegaly.  Sclera clear.  Conjunctiva normal, nares without drainage.  CHEST:  Clear to auscultation bilaterally.  No wheezing or rhonchi.  HEART:  Regular rate and rhythm, no gross murmur.  ABDOMEN:  Good bowel sounds, nontender, nondistended, no masses, no  bruits, obese.  LOWER EXTREMITIES:  No peripheral edema.  Skin warm and dry.  CRANIAL NERVES II-XII:  Grossly intact.   ASSESSMENT AND PLAN:  1. Chest pain.  2. Coronary artery disease.  3. Rheumatoid arthritis.  4. Hypertension.  5. Hyperlipidemia.  6. Asthma.  7. Depression.  8. Obesity.  9. Long term medication use.  10.Multiple joint surgeries.  11.ALLERGY TO SHRIMP, CODEINE AND PEANUTS.   The patient will be admitted to [facility], routine labs followed,  cardiac catheterization on Thursday because at this point the schedule  will not allow [place] to perform a cardiac catheterization tomorrow.  We  will premedicate the patient for her SHRIMP ALLERGY WHICH IS  ANAPHYLACTIC IN NATURE.      Guy Franco, P.A.      Armanda Magic, M.D.  Electronically Signed    LB/MEDQ  D:  12/10/2006  T:  12/10/2006  Job:  [id number]   cc:   Armanda Magic, M.D.  Deirdre Peer. Polite, M.D.

## 2011-03-23 NOTE — Assessment & Plan Note (Signed)
October 23, 2006   [name] returns to the clinic today accompanied by her husband.  The  patient has received acupuncture treatments initially by Dr. Bryson Ha in  [place].  She most recently has had a total of 6 acupuncture  treatments with Dr. Ricki Miller, who works locally.  She is not sure if she  gained any benefit, but she wants to try a few more acupuncture  treatments at that locale.  She also requested we set her up for  epidural steroid injections, which she has had in the past.  She would  like to consider the new doctor at [facility], who apparently  does epidural steroid injections.  She feels that this may give her more  benefit than from the epidural steroid injections she has received at  [facility] in the past with Dr. Barrington Ellison and Dr. Karin Golden.  She also  is considering having acupuncture treatments done in this office, but  she wants to continue with Dr. Ricki Miller at the present time.  She continues  to get reasonable relief from her OxyContin sustained release, along  with her p.r.n. Percocet as noted below.  She has no need for refills at  this time.   MEDICATIONS:  1. Prednisone 5 mg.  2. Prilosec 20 mg daily p.r.n.  3. Lasix 20 mg daily.  4. K-Dur 1 tablet daily.  5. Methotrexate 2.5 mg 3 tablets weekly.  6. OxyContin SR 20 mg nightly.  7. Flexeril 10 mg 1 to 2 tablets p.o. daily p.r.n.  8. Calcium 1000 mg daily.  9. Allegra 80 mg daily.  10.Zocor 40 mg.  11.Combivent metered dose inhaler 2 puffs q.i.d.  12.Actonel 35 mg q. week.  13.Lorazepam 1 mg nightly.  14.Percocet 5/325 1 tablet q.i.d. p.r.n.  15.Celebrex 200 mg b.i.d.   REVIEW OF SYSTEMS:  Noncontributory.   PHYSICAL EXAM:  Reasonably well-appearing elderly adult female in  moderate acute discomfort, seated in a manual wheelchair.  Blood pressure is 146/55, pulse 84, respiratory rate 16, and O2  saturation 99% on room air.  She has 4/5 strength throughout the bilateral upper and lower  extremities.   IMPRESSION:  1. Status post C1-C2 laminectomy secondary to cervical stenosis with      myelopathy.  2. History of rheumatoid arthritis with several joint replacements.  3. Moderate to severe lumbar spondylolisthesis at L3 on L4 and L4 on      L5.  4. Hypertension.   At the present time, no refill on medications is necessary.  We are  contacting [facility] to see if we can set the patient up with the new  interventional specialist at that office for epidural steroid injections  on the right at L5 and S1.  The patient is interested in continuing  acupuncture treatments with Dr. Ricki Miller at this time, but may switch to  this  office in the future.  No refill on other medicines is necessary at this  time.  We will plan on seeing the patient in followup in approximately 3  months' time.           ______________________________  [name], M.D.     DC/MedQ  D:  10/23/2006 16:05:38  T:  10/23/2006 20:53:06  Job #:  [id number]

## 2011-03-23 NOTE — Assessment & Plan Note (Signed)
Angela [name] returns to the clinic today for followup evaluation.  She is a 75-  year-old female with severe rheumatoid arthritis with several joint  replacements in the past.  She underwent C1 laminectomy and microdiskectomy  along with arthrodesis for stenosis and myelopathy on October 21, 2003,  performed by Dr. Newell [name].   The patient has continued to do well.  She has finished all home health  therapies at this time.  She still has significant weakness and pain in her  low back and has seen Dr. Newell [name] in followup.  They are planning  possibility of future suture and she is due for a followup MRI scan with Dr.  Newell [name] in late May with an appointment in June of 2005.   The patient ambulates without any assistive device inside the house, but  uses a quad cane outside the home.   The patient is followed up with Dr. Phylliss [name], her rheumatologist.  Dr. Phylliss [name] is  prescribing her OxyContin 10 mg b.i.d. along with Percocet one tablet daily.  He fills those medications for her at the present time.   The patient reports that she had some problems with her right elbow and Dr.  Phylliss [name] has told her that it is nearly dislocated.  He has suggested no  extensive stress to the right upper extremity.  She does have a fluid  collection of her left elbow at this time.   In terms of her neck, she continues to use her solid collar and Dr. Newell [name]  plans to have her in that collar until approximately June.  He reports that  he would like to see more healing around the screws and rods in her cervical  region.  She does need a new Aspen collar and we have contacted [employer]  today in the office.   MEDICATIONS:  1. Celebrex 200 mg two tablets daily.  2. Prednisone 5 mg daily.  3. Prilosec 20 mg p.r.n.  4. Lasix 20 mg daily.  5. K-Dur one tablet daily.  6. Methotrexate 2.5 mg three tablets weekly.  7. OxyContin 10 mg b.i.d.  8. Percocet one tablet daily p.r.n.  9. Calcium 1000 mg daily.  10.       Fioricet 50/325 mg one tablet q.4h. p.r.n.  11.      Allegra 180 mg daily.  12.      Zocor 40 mg daily.  13.      Combivent two puffs q.i.d.  14.      Actonel 35 mg every week.  15.      Lorazepam 1 mg q.h.s. p.r.n.  16.      Nasonex one squirt in each nostril q.h.s.   PHYSICAL EXAMINATION:  A reasonably well-appearing elderly female in minimal  acute distress.  She does have a solid Aspen collar on at the present time,  but the bottom portion of the brace has been removed.  She ambulates with a  quad cane in her left upper extremity.  She has 3+ to 4/5 strength  throughout her bilateral upper and lower extremities.  She has significant  deformities of the bilateral hands and elbows. She has reflexes of 1-2+ in  the bilateral upper and lower extremities.   IMPRESSION:  1. Status post C1-2 laminectomy secondary to cervical stenosis with     myelopathy.  2. History of rheumatoid arthritis with several joint replacements in the     past.  3. Urinary retention, improved.  4. Hypertension.   At the present time,  I have contacted [employer] and she will go into their  office today to get a new Aspen collar as the present one is showing signs  of use.  I have also given her a prescription for Nasonex one squirt in each  nostril q.h.s.  Dr. Phylliss [name] is filling her oxycodone and Percocet medications  at the present time.  We will plan on seeing her in followup in this office  in approximately two months' time.  We have both decided to avoid outpatient  therapies at this point as that put too much stress on her bilateral upper  extremities, especially her right elbow where she has a near dislocation.  She is ambulatory with a quad cane outside the home and without any  assistive device inside the home.  That appears to be reasonable for her at  the present time.  I am not concerned about any overuse of narcotics as she  is at significant operative risk if she were to undergo lumbar surgery in  the  future.  The patient and her husband are both in agreement with  maximizing pain medication before any attempted lumbar surgery.      Angela [name], M.D.   DC/MedQ  D:  02/16/2004 14:26:08  T:  02/16/2004 18:16:16  Job #:  [id number]

## 2011-03-23 NOTE — Assessment & Plan Note (Signed)
HISTORY:  Angela [name] returns to the clinic today for followup evaluation.  She reports that she had a very bad evening last night and her pain was  completely uncontrolled.  She has decided to decrease her OxyContin on her  own and she is now taking 20 mg of the OxyContin just once a day.  She has  also started some acupuncture treatment with Dr. Bryson Ha in [place].  She has  had seven treatments and had the acupuncture done of her arms, legs, head,  and back.  She is noting questionable benefit at the present time.   The patient would like to consider taking a different pain medicine.  Her  primary care physician had suggested Duragesic patch.   MEDICATIONS:  1.  Prednisone 5 mg daily.  2.  Prilosec 20 mg daily p.r.n.  3.  Lasix 20 mg p.o. daily.  4.  K-Dur one tablet daily.  5.  Methotrexate 2.5 mg three tablets weekly.  6.  OxyContin CR 20 mg daily.  7.  Flexeril 10 mg one two to two tablets p.o. daily p.r.n.  8.  Calcium 1000 mg daily.  9.  Allegra 80 mg daily.  10. Zocor 40 mg daily.  11. Combivent metered-dose inhaler two puffs q.i.d.  12. Actonel 35 mg weekly.  13. Lorazepam 1 mg q.h.s. p.r.n.  14. Percocet 5/325 one tablet b.i.d. p.r.n.  15. Celebrex 200 mg b.i.d.  16. Lidoderm patch 5%, on 12 hours, off 12 hours daily.   REVIEW OF SYSTEMS:  Positive for limb swelling.   PHYSICAL EXAMINATION:  GENERAL:  Reasonably well-appearing, elderly female  seated in a regular wheelchair.  VITAL SIGNS:  Blood pressure 108/42, pulse 78, respiratory rate 16, O2  saturation 94% on room air.  She has 4/5 strength to bilateral upper and 4-  /5 strength bilateral lower extremities.   IMPRESSION:  1.  Status post C1-C2 laminectomy secondary to cervical stenosis with      myelopathy.  2.  History of rheumatoid arthritis with several joint replacements.  3.  Moderate to severe lumbar spondylolisthesis of L3 on L4 and L4 on L5.  4.  Hypertension.   In the office today, we did decide to  start her on a fentanyl patch at 25  mcg per hour, change q.72h.  We also refilled her Percocet at 5/325 one  tablet p.o. t.i.d. p.r.n.  We have given her a few more OxyContin to allow  her to have those available to her if she has difficulty getting the  fentanyl patch and the OxyContin is written for 20 mg one tablet q.12h.  I  will plan on seeing her in followup in approximately three months' time.  I  have asked her to call into the office next week if she is not getting  adequate relief on the fentanyl and, if she is tolerating the medicine, we  may decide to increase to 50 mcg per hour.  Will plan on seeing her in  followup in approximately three months' time.  The patient is due to follow up with Dr. Phylliss Bob May 20, 2006, to review her  bone density tonometry study and also to follow up with Dr. Ezzard Standing on May 17, 2006.           ______________________________  [name], M.D.     DC/MedQ  D:  04/22/2006 16:10:45  T:  04/23/2006 12:03:03  Job #:  [id number]

## 2011-03-23 NOTE — Assessment & Plan Note (Signed)
Angela [name] returns to the clinic today accompanied by her husband.  The  patient was last seen in this office October 23, 2006.   Since last clinic visit the patient unfortunately developed jaw and  chest pain early February, 2008.  She had stenting of her left anterior  descending artery done by Dr. Mayford Knife and Dr. Lavell Luster December 11, 2006.  There was apparently a 70% blockage of the LAD at that point.  She has  been placed on aspirin and Plavix since that time.  She feels that she  has definitely developed some nausea related to the Plavix but the  cardiologist told her that they have no other good option and that she  needs to stay on that medication.   Dr. Phylliss Bob, her rheumatologist, has recommended that she take folic acid  since she is on methotrexate.  That probably would also be a good idea  based on the recent cardiovascular problems that she has had.  She and  her husband both take ocular vitamins along with [employer].  I have  asked them to check the dose of the folic acid in those 2 vitamins to  see if it comes up to the recommended dose of 800 mcg per day that the  rheumatologist has recommended.   The patient reports that she continues to get good relief from her  Percocet use, 3-4 tablets per day, along with her OxyContin sustained  release, 20 mg at bedtime, and her lorazepam 1 mg q.h.s.  The medicines  seem to give her good relief and also help her sleep at night.  She does  request refills on each of those 3 medicines in the office today.   CURRENT MEDICATIONS:  1. Prednisone 5 mg daily.  2. Prilosec 20 mg daily p.r.n.  3. Lasix 20 mg daily.  4. K-Dur one tablet daily.  5. Methotrexate 2.5 mg three tablets weekly.  6. OxyContin sustained release 20 mg q.h.s.  7. Flexeril 10 mg, 1-2 tablets p.o. daily p.r.n.  8. Centrum Cardio one tablet daily.  9. Calcium 1000 mg daily.  10.Allegra 80 mg daily.  11.Zocor 40 mg daily.  12.Combivent metered dose inhaler 2  puffs q.i.d.  13.Actonel 35 mg every week.  14.Lorazepam 1 mg q.h.s.  15.Percocet 5/325, one tablet q.i.d. p.r.n.  16.Lidoderm patches p.r.n.  17.Folic acid, equivalent of 800 mcg daily.   REVIEW OF SYSTEMS:  Positive for weight loss.   PHYSICAL EXAM:  A well-appearing, middle-aged, adult female in moderate  acute discomfort, seated in a manual wheelchair.  Blood pressure was  127/53 with a pulse of 44, respiratory rate 16 and O2 saturation 98% on  room air.  She has 4/5 strength throughout the bilateral upper and lower  extremities.   IMPRESSION:  1. Status post C1-C2 laminectomy secondary to cervical stenosis with      myelopathy.  2. History of rheumatoid arthritis with several joint replacements.  3. Moderate to severe lumbar spondylolisthesis at L3 on L4 and L4 on      L5.  4. Hypertension.  5. Coronary artery disease with recent stenting of the left anterior      descending.   Presently, the patient has had refills of the oxycodone sustained  release along with Percocet and lorazepam in the office today.  The  lorazepam was written for a 90-day supply so that the can mail it in and  get that through their mail-in pharmacy service.  We will plan on seeing  the patient in followup in this office in approximately 3-4 months' time  with refills of medication prior to that appointment as necessary.           ______________________________  [name], M.D.     DC/MedQ  D:  01/17/2007 14:27:04  T:  01/18/2007 22:29:39  Job #:  [id number]

## 2011-03-23 NOTE — Assessment & Plan Note (Signed)
Angela [name] returns to the clinic today for followup evaluation.  She is a  75 year old female with a past medical history significant for rheumatoid  arthritis with several joint replacements along with hypertension and  elevated cholesterol.  The patient was admitted on October 21, 2003, for  planned C1 laminectomy and microdiskectomy along with arthrodesis secondary  to cervical stenosis and myelopathy.  The patient had rheumatoid destruction  of the C1-2 level and required stabilization.  She underwent the C2  laminectomy and decompression at C1-2 on October 19, 2003, by Dr. Newell [name].   The patient eventually stabilized and was moved to the rehabilitation unit  on October 21, 2003.  She remained there through discharge on October 29, 2003.   Since discharge, the patient did follow up with Dr. Newell [name] in mid January  of 2005.  X-rays were reportedly okay at that time and the patient continued  in her sternal occipital mandibular brace.  She is due to follow up with Dr.  Newell [name] on December 14, 2003, and they are planning to probably remove the  lower portion of the brace and continue a cervical collar.   The patient reports that she had previously received oxycodone and OxyContin  medication from Dr. Phylliss [name], her rheumatologist.  She continues to see Dr.  Phylliss [name], but has not seen him since discharge from the hospital.   The patient is receiving home health physical therapy three times per week  and has made good progress.  She is ambulating with a standard rolling  walker at the present time.   In terms of her bladder, she has improved and is voiding continently at the  present time.  She is noting no problems whatsoever.   MEDICATIONS:  1. Celebrex 200 mg two tablets daily.  2. Prednisone 5 mg daily.  3. Prilosec 20 mg p.r.n.  4. Lasix 20 mg daily.  5. K-Dur one tablet daily.  6. Methotrexate 2.5 mg three tablets weekly.  7. Oxycodone 5/325 mg one to two tablets q.12h.  p.r.n.  8. Calcium 1000 mg daily.  9. Fioricet 50/325 mg one tablet q.4h. p.r.n.  10.      Allegra 180 mg daily.  11.      Zocor 40 mg daily.  12.      Combivent two puffs q.i.d.  13.      Actonel 35 mg every week.  14.      OxyContin 10 mg one tablet q.12h.  15.      Lorazepam 1 mg p.o. q.h.s.   PHYSICAL EXAMINATION:  A well-appearing, elderly female with a sternal  occipital mandibular brace in place.  She does ambulate with a standard  walker, but is also present in the clinic today using a wheelchair.  She has  strength of generally 3+ to 4/5 throughout her bilateral upper extremities.  She has significant deformities of her bilateral hands and also deformities  of her right elbow greater than left.  Her strength in the lower extremities  was generally 4/5 throughout.  Bulk and tone were normal.  Reflexes were 2+  and symmetrical.   IMPRESSION:  1. Status post C1-2 laminectomy secondary to cervical stenosis with     myelopathy.  2. History of rheumatoid arthritis with several joint replacements.  3. Urinary retention, improved.  4. Hypertension.  5. Depression.  6. Hypercholesterolemia.  7. Asthma.   At the present time, the patient is doing well and still receives home  health therapy.  I have recommended  continuation of home health physical  therapy two to three times per week.   In terms of her pain medication, we did discuss her obtaining her pain  medicines either through this office or continuing with Dr. Phylliss [name].  The  patient would like to discuss this with Dr. Phylliss [name].  I certainly am  comfortable doing either refilling the medication through this office or  having Dr. Phylliss [name] continue to do that.  It is completely up to him at this  point.  The patient will be discussing that with him when she sees him in  followup.   The patient will also be following with Dr. Newell [name] over the next week or  so to see if the brace can be modified and possibly replaced with a cervical   collar only.   I have refilled the patient's OxyContin CR 10 mg one tablet p.o. q.12h., a  total of 60.  I just refilled her oxycodone medication last week when she  ran out of her medication.  She was unable to keep an appointment with Dr.  Phylliss [name] when she would have normally had her medications filled.   I will plan on seeing the patient in followup at approximately two months'  time.      [name], M.D.   DC/MedQ  D:  12/01/2003 16:56:55  T:  12/01/2003 17:43:17  Job #:  [id number]

## 2011-03-23 NOTE — Assessment & Plan Note (Signed)
MEDICAL RECORD NUMBER:  [id number]   Mrs. Angela [name] returns to the clinic today accompanied by her husband. The  patient has had some problems recently related to Lyrica which was  apparently prescribed through another office. She apparently developed  severe welts of her arms and legs and back. She was seen by the prescribing  physician and apparently was felt to have an allergic reaction to Lyrica.  She was subsequently started on an increased dose of prednisone for 10 days.  She was also treated with doxycycline for 10 days in case any of her welts  became infected. That has subsequently improved and she is not having any  allergic reaction at the present. She does use her OxyContin CR 20 mg  anywhere from 2-3 times per day. She also uses the Percocet approximately 1-  2 times per day. She would like a refill on both of those in the office  today. She also requests a refill on her Celebrex. She has started back on  that as she is willing to take the risk associated with cardiovascular  disease and Celebrex. She has been taking the 200 mg twice a day and would  like a total of #180 which is a 35-month supply. She mails that in to her  mail-in pharmacy.   The patient does have recent labs showing an elevated rheumatoid factor  measured at 281 on October 25, 2005. Her white blood cell was recently  elevated secondary to prednisone. She does have an elevated erythrocyte  sedimentation rate up to the mid 60s but her prednisone has improved that  sed rate down to 11 as of October 25, 2005.   The patient's main complaint in the office today is pain in her right  buttock region. This is isolated and is present in her right buttock region  without significant radiation. It is not substantially affected by the pain  medicines noted above. She tries to walk with her walker and use the walker  entirely in the home. She does not use the wheelchair except for out-of-  house use for longer  distances.   MEDICATIONS:  1.  Prednisone 5 mg daily.  2.  Prilosec 20 mg daily p.r.n.  3.  Lasix 20 mg daily.  4.  K-Dur one tablet daily.  5.  Methotrexate 2.5 mg three tablets weekly.  6.  OxyContin CR 20 mg one tablet t.i.d. p.r.n.  7.  Flexeril 10 mg one to two tablets p.o. daily p.r.n.  8.  Calcium 100 mg daily.  9.  Allegra 180 mg daily.  10. Zocor 40 mg daily.  11. Combivent two puffs q.i.d.  12. Actonel 35 mg weekly.  13. Lorazepam 1 mg q.h.s. p.r.n.  14. Percocet 5/325 one tablet b.i.d. p.r.n.  15. Celebrex 200 mg b.i.d.   REVIEW OF SYSTEMS:  Noncontributory.   PHYSICAL EXAMINATION:  Ill-appearing over-weight adult female in moderate  acute discomfort. Blood pressure is 113/60 with a pulse of 101, respiratory  rate 16, and O2 saturation 97% on room air. She has approximately 4/5  strength throughout the bilateral upper and lower extremities. She is able  to stand but does not take very many steps. She gets mostly around by  wheelchair for longer distances but is able to walk with her walker.   IMPRESSION:  1.  Status post C1-2 laminectomy secondary to cervical stenosis with      myelopathy.  2.  History of rheumatoid arthritis with several joint replacements in the  past.  3.  Moderate to severe lumbar spondylolisthesis of L3 on L4 and L4 on L5.  4.  Hypertension.  5.  Status post laparoscopic cholecystectomy September 2006.   In the office today we did give the patient samples of Lidoderm patches 5%  one patch to be applied to the right buttock region on 12 hours and off 12  hours daily. We have also refilled her Celebrex at 200 mg one tablet b.i.d.  as of today with a 8-month supply. We refilled her Percocet and OxyContin as  of January 20. We will plan on seeing the patient in follow-up in this  office in approximately 3 months' time. She will call in for a refill on the  Lidoderm patches if it does seem to give her some benefit from the buttock  pain. We  also may try a buttock injection or possibly the patient is  wondering about acupuncture for that area. She has completed epidural  steroid injections, a series of three, in November 2006.   We will plan on seeing the patient in follow-up in approximately 3 months'  time.           ______________________________  [name], M.D.     DC/MedQ  D:  11/07/2005 16:06:24  T:  11/07/2005 17:41:49  Job #:  [id number]

## 2011-03-23 NOTE — Assessment & Plan Note (Signed)
HISTORY OF PRESENT ILLNESS:  Ms. Deemer returns to the clinic today for  follow up evaluation.  Unfortunately, she has developed some pain beneath  her right breast which was evaluated by her primary care physician.  That  eventually led to CT of her abdomen and pelvis, and they found gallstones  within the neck of the gallbladder.  They are planning to refer her on to  her surgeon for treatment, and she is selecting a surgeon at this time.  She  is leaning towards Dr. Derrell Lolling.   The patient reports that she still continues to take her pain medications,  specifically, her OxyContin 20 mg t.i.d. p.r.n. and her Percocet 5/325 one  tablet b.i.d. p.r.n.  She sometimes is able to lower her amount of  medication that she takes in a day, and other days she takes a maximum  amount of pain medicine and then still has severe, uncontrolled pain.  That  occurs only approximately three days per month.   The patient also has been evaluated and found to have a kidney disorder, and  she is being referred to Dr. Isabel Caprice to evaluate that.  She also has a  cataract which needs removed from the right eye.   MEDICATIONS:  1.  Prednisone 5 mg daily.  2.  Prilosec 20 mg daily p.r.n.  3.  Lasix 20 mg daily.  4.  K-Dur one tablet daily.  5.  Methotrexate 2.5 mg three tablets weekly.  6.  OxyContin CR 20 mg one tablet t.i.d. p.r.n.  7.  Flexeril 10 mg 1-2 tablets p.o. daily p.r.n.  8.  Calcium 1000 mg daily.  9.  Allegra 180 mg daily.  10. Zocor 40 mg daily.  11. Combivent two puffs q.i.d.  12. Actonel 35 mg q.week.  13. Lorazepam 1 mg q.h.s. p.r.n.  14. Percocet 5/325 one tablet b.i.d. p.r.n.   PHYSICAL EXAMINATION:  GENERAL APPEARANCE:  Reasonably well-appearing adult  female with significant arthritic abnormalities of her bilateral upper and  lower extremities.  VITAL SIGNS:  Blood pressure 120/52 with pulse of 78, respirations 16, O2  saturation 98% on room air.  EXTREMITIES:  She has at least 4/5  strength throughout the bilateral upper  and lower extremities.  She ambulates in a slow, purposeful manner, using a  walker and occasionally using a wheelchair for mobility.   IMPRESSION:  1.  Status post C1-2 laminectomy secondary to cervical stenosis with      myelopathy.  2.  History of rheumatoid arthritis with several joint replacements in the      past.  3.  Hypertension.  4.  Gallbladder disease.   PLAN:  In the office today we did refill her Percocet and OxyContin as of  May 29, 2005.  She will be canvassing her friends to see which general  surgeon she wants, but she is leaning towards Dr. Derrell Lolling.  Apparently, Dr.  Newell Coral, her neurosurgeon, has also recommended Dr. Derrell Lolling to the patient.   We plan on seeing the patient in follow up in this office in approximately  three month's time with refills in medication prior to that appointment if  necessary.       DC/MedQ  D:  05/16/2005 16:22:55  T:  05/17/2005 06:54:51  Job #:  [id number]

## 2011-03-23 NOTE — Assessment & Plan Note (Signed)
HISTORY OF PRESENT ILLNESS:  [name] returns to the clinic today  accompanied by her husband.  The patient reports that she has been back to  see Dr. Newell Coral approximately a few weeks ago.  Dr. Newell Coral says that  there is still healing taking place, but the progress of the healing is  slow.  She reports that he did allow her to come out of the soft collar on a  periodic basis.  He would like her to be involved in water aerobics.   The patient did experience chest pain July 31, 2004.  She went into the  emergency room and had a stress test done.  They set her up for cardiac  catheterization, and that was done a couple of weeks ago.  That, apparently  showed only 30% blockage in one upper artery with no other significant  abnormalities.  It was felt that she had a murmur that may cause false  positive on the stress test reading.   Her pain medications have recently been changed by Dr. Newell Coral.  She had  been taking OxyContin 20 mg t.i.d.  Dr. Newell Coral felt that she was  experiencing adequate pain relief and decreased her to 10 mg b.i.d.  She was  still allowed to use her Percocet on an as needed basis.  She was very  concerned that she will not be able to tolerate the water aerobics that he  has planned for her.  She requested that that dose of medication be adjusted  so that she has better pain relief going to the outpatient therapy.  Her  primary care physician, Dr. Merril Abbe, has encouraged her to do water  aerobics, and she is planning to start that over the next couple of weeks.  She reports that she tries to use her exercise bicycle at home on a daily  basis.   MEDICATIONS:  1.  Celebrex 200 mg two tablets daily.  2.  prednisone 5 mg daily.  3.  Prilosec 20 mg p.r.n.  4.  Lasix 20 mg daily.  5.  K-Dur one tablet daily.  6.  Methotrexate 2.5 mg three tablets weekly.  7.  OxyContin 10 mg b.i.d.  8.  Cyclobenzaprine 1-2 tablets daily p.r.n.  9.  Calcium 1000 mg  daily.  10. Allegra 180 mg daily.  11. Zocor 40 mg daily.  12. Combivent two puffs q.i.d.  13. Actonel 35 mg q.weekly.  14. Lorazepam 1 mg q.h.s. p.r.n.  15. Nasonex p.r.n.  16. Percocet 5/325 one tablet b.i.d. p.r.n.   PHYSICAL EXAMINATION:  GENERAL APPEARANCE:  Reasonably well appearing adult  female with obvious deformity secondary to rheumatoid arthritis.  VITAL SIGNS:  Blood pressure 118/59 with pulse 69, respirations 20, O2  saturations 97% on room air.  NEUROLOGICAL:  She has 4-4+/5 strength throughout the bilateral upper and  lower extremities.  She ambulates with a walker in the office today.   IMPRESSION:  1.  Status post C1-2 laminectomy secondary to cervical stenosis with      myelopathy.  2.  History of rheumatoid arthritis with several joint replacements in the      past.  3.  Urinary retention, improved.  4.  Hypertension.   PLAN:  In the clinic today, we did encourage her to use only pain  medications as absolutely necessary.  We have suggested that we try a dose  in between the prior dose as the present dose that she is on.  With that in  mind, we  have allowed her to take her OxyContin 10 mg t.i.d. for a total of  30 mg daily.  There will actually be a cut of 50% compared to her prior  dose.  It may be that cut to 20 mg a day is too severe for her at the  present time.  We will see how she does on 30 mg daily.  She still can use  the Percocet approximately 1-2 tablets daily p.r.n., and that has worked  well for her in the past.  She does not overuse that medication.   The patient still reports low back pain which will probably be treated  surgically in the future.  She is really concerned about any eminent surgery  and plans to put that off as long as possible.   In terms of her walking, we have told her that Home Health therapy would not  be able to see her.  I have encouraged her to try to use her quad cane on a  periodic basis at home with her husband in  place of the walker.  I feel that  she should continue to use the walker for mobility, especially if she goes  to the pool and is around the slippery areas of the pool.   I will plan on seeing the patient in follow up in this office in  approximately 2-3 months time.       DC/MedQ  D:  08/16/2004 14:27:45  T:  08/16/2004 14:55:36  Job #:  [id number]

## 2011-03-27 NOTE — Op Note (Signed)
   NAME:  Angela [name], Angela [name]                          ACCOUNT NO.:  [id number]   MEDICAL RECORD NO.:  [id number]                   PATIENT TYPE:  AMB   LOCATION:  ENDO                                 FACILITY:  [facility]   PHYSICIAN:  Danise Edge, M.D.                DATE OF BIRTH:  1936-04-11   DATE OF PROCEDURE:  07/30/2003  DATE OF DISCHARGE:                                 OPERATIVE REPORT   PROCEDURE:  Screening colonoscopy.   INDICATIONS FOR PROCEDURE:  Ms. Ambreen Tufte is a 75 year old female with  rheumatoid arthritis born 11-08-1935. Ms. Godby is scheduled to  undergo her first screening colonoscopy with polypectomy to prevent colon  cancer.   ENDOSCOPIST:  Charolett Bumpers, M.D.   PREMEDICATION:  Versed 5 mg, Demerol 100 mg .   DESCRIPTION OF PROCEDURE:  After obtaining informed consent, Ms. Smarr was  placed in the left lateral decubitus position. I administered intravenous  Demerol and intravenous Versed to achieve conscious sedation for the  procedure. The patient's blood pressure, oxygen saturation and cardiac  rhythm were monitored throughout the procedure and documented in the medical  record.   Anal inspection was normal. Digital rectal exam was normal. The Olympus  adjustable pediatric colonoscope was introduced into the rectum and advanced  to the cecum. Colonic preparation for the exam today was excellent.   RECTUM:  Normal.   SIGMOID COLON AND DESCENDING COLON:  Normal.   SPLENIC FLEXURE:  A 0.5 mm - 1 mm sized polyp was removed from the splenic  flexure with the cold biopsy forceps and submitted for pathological  interpretation.   TRANSVERSE COLON:  Normal.   HEPATIC FLEXURE:  Normal.   ASCENDING COLON:  Normal.   CECUM AND ILEOCECAL VALVE:  Normal.    ASSESSMENT:  A diminutive polyp was removed from the splenic flexure;  otherwise normal screening proctocolonoscopy to the cecum. No endoscopic  evidence for the presence of colorectal  cancer.   RECOMMENDATIONS:  Virtual colonoscopy in approximately 5 years.                                               Danise Edge, M.D.    MJ/MEDQ  D:  07/30/2003  T:  07/31/2003  Job:  [id number]   cc:   Areatha Keas, M.D.  [address]  [address]  [place]  [place] [address]  Fax: [phone]   Ike Bene, M.D.  [address]  [place]  [place] [address]  Fax: [phone]

## 2011-03-27 NOTE — Discharge Summary (Signed)
NAME:  Angela [name], Angela [name]                          ACCOUNT NO.:  [id number]   MEDICAL RECORD NO.:  [id number]                   PATIENT TYPE:  INP   LOCATION:  3021                                 FACILITY:  [facility]   PHYSICIAN:  Hewitt Shorts, M.D.            DATE OF BIRTH:  September 23, 1936   DATE OF ADMISSION:  10/11/2003  DATE OF DISCHARGE:  10/21/2003                                 DISCHARGE SUMMARY   HISTORY OF PRESENT ILLNESS:  [name] with  a longstanding history of rheumatoid arthritis dating back 38 years for  which she was on chronic therapy with prednisone, methotrexate, Celebrex,  OxyContin, and Percocet.  The patient developed numbness in the left  auricular and periauricular region, upper left-sided neck pain, generalized  weakness. She had been evaluated and found to have advanced rheumatoid  arthritic changes in the C1-2 level with a large pannus. There was evidence  of instability on flexion-extension x-rays with stenosis of the C1-2 level,  with evidence of cord compression and increased signal from within the  spinal cord consistent with myelopathy. Further details of her admission  history and physical examination are included in her admission note;  however, significant aspects from the examination reveal significant  rheumatoid deformities of nearly every joint. She had good range of motion  of the neck though without discomfort on range of motion today. The patient  gave way on testing of the upper extremities, it was felt that the strength  was most likely profound in the deltoids, over the left biceps and triceps,  difficult to assess due to advanced rheumatoid disruptive changes to  the  right elbow which caused significant pain.  The intrinsic movement in her  hands as well as her grips were limited due to rheumatoid deformity of her  hands, 4+ on the left and 4- on the right, plantar extensors 5 bilaterally.  Plantar flexion and  dorsal flexion are limited due to bilateral ankle  fusions.   HOSPITAL COURSE:  The patient was admitted and underwent C1 laminectomy in  her occiput, cervical spine arthrodesis with vertex posterior  instrumentation - [place]raft, and infused. She had a good bit of pain  postoperatively. Her wounds were clean and dry. Her brace did require some  adjustment. The pain was somewhat difficult to control. It was felt that  this was most likely do to her longstanding treatment with narcotic  medications by her rheumatologist.   However, six days postoperatively she began to complications of numbness and  heaviness in her arms and hands.  MRI of the cervical spine was obtained and  it was felt that there was significant canal stenosis of the C1-2 level  dorsally.  X-rays showed the hardware to be in position and the overall  anatomy was good.  It was felt best to return to the operating room to  examine the decompression and the  patient was taken to the operating room on  October 19, 2003. She underwent exploration of the arthrodesis and  decompression, and we proceeded with a C2 laminectomy and suboccipital  craniectomy for decompression of the foramen magnum.  We had placed the  cross link at the time of her initial surgery at the C1-2 level that was  removed to allow exposure for further decompression, and we placed the new  cross link at that level.  Later that day the patient had significant  improvement in his strength and sensation in her extremities.   The patient was seen in consultation by physical therapy and occupational  therapy, and two days following her second surgery she was able to ambulate  through the door.  It was felt that she would benefit from inpatient  rehabilitation and physical medicine rehabilitation service was consulted.  The patient was felt to be appropriate for comprehensive rehabilitation and  was transferred on the day of discharge.   FINAL DIAGNOSES:   1. Rheumatoid arthritis.  2. Rheumatoid arthritis obstructive changes at C1-2 level with associated     instability and cervical stenosis with myelopathy.  3. Delayed postoperative paresis secondary to stenosis.                                                Hewitt Shorts, M.D.    RWN/MEDQ  D:  11/10/2003  T:  11/11/2003  Job:  [id number]

## 2011-03-27 NOTE — H&P (Signed)
NAME:  Angela [name], Angela [name]                          ACCOUNT NO.:  [id number]   MEDICAL RECORD NO.:  [id number]                   PATIENT TYPE:  INP   LOCATION:  3021                                 FACILITY:  [facility]   PHYSICIAN:  Hewitt Shorts, M.D.            DATE OF BIRTH:  [birth year]   DATE OF ADMISSION:  10/11/2003  DATE OF DISCHARGE:                                HISTORY & PHYSICAL   HISTORY OF PRESENT ILLNESS:  The patient is a 75 year old right-handed white  female with longstanding history of rheumatoid arthritis who presented with  progressive destructive changes and instability at the C1-2 level.   Her history of rheumatoid arthritis dates back 38 years.  She is continued  on chronic therapy including prednisone 5 mg daily, methotrexate 7.5 mg  every week, Celebrex 200 mg b.i.d. and OxyContin 10 mg b.i.d. and Percocet  p.r.n. for pain.   For the past year, the patient has had left auricular and periauricular  numbness which has been worse for the preceding four months.  She has had  pain in the upper left cervical region for the preceding 3 1/2 months, some  generalized weakness due to her rheumatoid arthritis.  She does not describe  any recent deterioration in strength, but her gait has been wide based for  at least the past 3 1/2 months.  She has had several falls.   The patient was evaluated with x-rays and MRI which show advanced rheumatoid  arthritis changes at the C1-2 level with large panus.  There is evidence of  instability on flexion extension x-rays with stenosis at the C1-2 level with  evidence of significant stenosis and cord compression with evidence of  increased signal from within the spinal cord indicating evidence of  myelopathy.   The patient is admitted now for C1 laminectomy for decompression of her  stenosis and occipital to C2, C3, C4 arthrodesis for stabilization of her  instability at C1-2.   PAST MEDICAL HISTORY:  Notable for her history of  rheumatoid arthritis  dating back 38 years.  She has been treated for hypertension in the past,  but did not require treatment at this time.  She has no history of  myocardial infarction, cancer, stroke or diabetes.  Previous surgeries are  numerous and essentially all orthopedic including bilateral knee  replacements, number of joint replacements in her hands, fusion of her  ankles and so on.  She reports an allergy to CODEINE that causes nausea.   CURRENT MEDICATIONS:  1. Celebrex 200 mg b.i.d.  2. Prednisone 5 mg daily.  3. Lexapro 10 mg q.h.s.  4. Prilosec 20 mg daily.  5. Lasix 20 mg daily.  6. K-Dur SR one tablet daily.  7. Methotrexate 7.5 mg every week.  8. Percocet p.r.n. pain.  9. Calcium 1000 mg daily.  10.      Fioricet q.4h p.r.n.  11.  Allegra 180 mg daily.  12.      Detrol LA 4 mg daily.  13.      Zocor 40 mg daily.  14.      Prempro 0.625/5 one tablet daily.  15.      Combivent two puffs q.i.d.  16.      Actonel 35 mg weekly.  17.      OxyContin 10 mg b.i.d.  18.      Ativan 1 mg q.h.s.   FAMILY HISTORY:  Father died at age 48 in motor vehicle accident.  Her  mother had been in poor health with Alzheimer's disease.   SOCIAL HISTORY:  The patient is retired.  She is married.  She does not  smoke, drink alcoholic beverage or have history of substance abuse.   REVIEW OF SYSTEMS:  Notable for those that are described in the history of  present illness and past medical history, but is otherwise unremarkable.   PHYSICAL EXAMINATION:  GENERAL:  The patient is somewhat obese white female  with obese deformities related to her rheumatoid arthritis, but who is in no  acute distress.  LUNGS:  Clear to auscultation.  She has symmetric respiratory excursion.  HEART:  Regular  rate and rhythm.  S1, S2 with no murmur.  ABDOMEN:  Soft.  Nondistended.  Bowel sounds are present.  EXTREMITIES:  Shows significant rheumatoid arthritis deformities of nearly  every  joint.   She has good range of motion of the neck without discomfort  on range of motion of the neck.  NEUROLOGIC:  Shows she gives way slightly with deltoids, but they seem to be  probably 5/5.  The left biceps, triceps are 5/5.  However, the right biceps,  triceps is difficult to examine due to pain associated with advanced  rheumatoid destructive changes in the right elbow causing pain on testing,  but the strength is at least 4- to 4/5.  Intrinsic movement is limited due  to rheumatoid deformity as are her grips.  Intrinsic strength is at least 1-  2.  Grips are 3/5.  Iliopsoas is 4 on the left, 4- on the right.  Strength  is 5/5 in the quadriceps.  However, the plantar flexion and dorsiflexion are  limited due to her bilateral ankle fusions.  Reflexes:  2-3 at the biceps,  brachialis and triceps on the left and 2 in the biceps, brachialis and  triceps on the right.  Quadriceps are 1-2 bilaterally.  Gastrocnemius are  minimal bilaterally, presumably due to her ankle fusions.  Toes were  equivocal to downgoing.  Her gait is somewhat wide based as is her stance.   IMPRESSION:  Destructive changes at C1-2 level with dynamic instability  secondary to rheumatoid arthritis with resulting stenosis at the C1-2 level  and evidence of changes of the spinal cord consistent with myelopathy.   PLAN:  The patient will be admitted for a C1 laminectomy and an occipital to  cervical arthrodesis with posterior __________ and bone graft with bone  morphogenic protein.  We discussed the nature of the procedure and risks of  surgery as well as risks of going without surgery including progressive  spinal cord damage and neurologic dysfunction.  We discussed the nature of  surgery, hospital stay and overall recuperation, limitations postoperatively  and the need for postoperative immobilization and an Aspen cervical collar with thoracic extension.  Risks of surgery including risk of infection,  bleeding, possibility  of transfusion, risk of spinal cord dysfunction,  paralysis of all four limbs, respiratory paralysis, ventilator dependency,  failure of the arthrodesis due to poor healing particularly  as related to her chronic use of prednisone and methotrexate as well as  anesthetic risk, myocardial infarction, stroke, pneumonia and death. After  discussing all of this, the patient does wish to go ahead with surgery and  is admitted for such.                                                Hewitt Shorts, M.D.    RWN/MEDQ  D:  10/13/2003  T:  10/13/2003  Job:  [id number]

## 2011-03-27 NOTE — Cardiovascular Report (Signed)
NAME:  Angela [name], Angela [name] NO.:  [id number]   MEDICAL RECORD NO.:  [id number]                   PATIENT TYPE:  OIB   LOCATION:  2899                                 FACILITY:  [facility]   PHYSICIAN:  Meade Maw, M.D.                 DATE OF BIRTH:  Mar 19, 1936   DATE OF PROCEDURE:  07/20/2004  DATE OF DISCHARGE:                              CARDIAC CATHETERIZATION   INDICATION FOR PROCEDURE:  Reversible ischemia demonstrated in the anterior  wall.  This was associated with anterior hypokinesis.  Ejection fraction of  approximately 43%.   PROCEDURE:  After obtaining written informed consent, the patient was  brought to the cardiac catheterization lab in a postabsorptive state.  Preop  sedation was achieved using IV Versed.  The right groin was prepped and  draped in the usual sterile fashion.  Local anesthesia was achieved using 1%  Xylocaine.  A 6 French hemostasis sheath was placed into the right femoral  artery using a modified Seldinger technique.  Selective coronary angiography  was performed using a JL-4, JR-4 Judkins catheter.  Multiple views were  obtained.  All catheter exchanges were made over guide wire.  Single plane  ventriculogram was performed in the RAO position using a 6 French pigtail  curved catheter.  Following review of the films, there was no critical  disease noted.  The patient was transferred to the holding area.  There was  no immediate complications.  The hemostasis sheath was removed.  Hemostasis  was achieved using digital pressure.   FINDINGS:  1.  Aortic pressure is 141/72.  2.  LV pressure 141/6.  3.  EDP 13.  4.  Single plane ventriculogram revealed mild, moderate anterior      hypokinesis.  The estimated ejection fraction is 45%.  There was      significant ventricular arrhythmias throughout the ventriculogram.      There was mild mitral regurgitation only.   CORONARY ANGIOGRAPHY:  1.  The left main coronary artery  bifurcates into the left anterior      descending and circumflex vessel.  There is no significant disease in      the left main coronary artery.  2.  LAD:  LAD gives rise to three diagonals.  Following the second diagonal,      there is a 30-40% lesion in the mid LAD.  This has progressed from the      20% lesion noted in 2000.  3.  Circumflex vessel.  The circumflex vessel is a large caliber vessel      giving rise to a large OM-1, large OM-2 and a large OM-3.  There is no      disease noted in the circumflex or its branches.  4.  Right coronary artery:  The right coronary artery is a large artery.  It      is dominant for the posterior circulation  and gives rise to trivial RV      marginals, moderate size PDA and PL branch.  There is no disease noted      in the right coronary artery or its branches.   FINAL IMPRESSION:  1.  Mild to moderate anterior hypokinesis with an estimated ejection      fraction of 45%.  The cardiomyopathy is nonischemic in nature and may      actually be the result of her ventricular ectopy over the years.      Therefore, I would recommend beta blockers.  I will start her on      Lopressor 25 mg p.o. b.i.d. and we will titrate up as the      patient tolerates.  2.  Mid left anterior descending 30-40%.  Recommend to continue with statin      medication.  Her LDL goal should be less than 100.                                               Meade Maw, M.D.    HP/MEDQ  D:  07/20/2004  T:  07/20/2004  Job:  [id number]

## 2011-03-27 NOTE — Op Note (Signed)
NAME:  Angela [name], Angela [name]                ACCOUNT NO.:  [id number]   MEDICAL RECORD NO.:  [id number]          PATIENT TYPE:  AMB   LOCATION:  DAY                          FACILITY:  [facility]   PHYSICIAN:  Angelia Mould. Derrell Lolling, M.D.DATE OF BIRTH:  1936-03-25   DATE OF PROCEDURE:  07/13/2005  DATE OF DISCHARGE:                                 OPERATIVE REPORT   PREOPERATIVE DIAGNOSIS:  Chronic cholecystitis with cholelithiasis.   POSTOPERATIVE DIAGNOSIS:  Chronic cholecystitis with cholelithiasis.   OPERATION PERFORMED:  Laparoscopic cholecystectomy with intraoperative  cholangiogram.   SURGEON:  Angelia Mould. Derrell Lolling, M.D.   FIRST ASSISTANT:  Maisie Fus A. Cornett, M.D.   OPERATIVE INDICATIONS:  This is a 75 year old white female with steroid  dependent rheumatoid arthritis. She has a 2-year history of right upper  quadrant pain intermittently. For the past 2 months, this is become more  frequent and more of a problem with some nausea and fatty food intolerance.  CT scan shows gallstones but no other abnormalities. Liver function tests  are normal. Because of the accelerating symptoms, she would like to have  cholecystectomy. She is brought to the operating room electively.   OPERATIVE TECHNIQUE:  Following the induction of general endotracheal  anesthesia, the patient's abdomen was prepped and draped in a sterile  fashion. The patient was identified. Intravenous antibiotics had been given  preoperatively. 0.5% Marcaine with epinephrine was used as a local  infiltration anesthetic. A vertically oriented incision was made just above  the umbilicus. The fascia was incised in the midline and the abdominal  cavity entered under direct vision. A 10 mm Hassan trocar was inserted and  secured with a pursestring suture of #0 Vicryl. Pneumoperitoneum was  created. The video camera was inserted with visualization and findings as  described above. A 10 mm trocar was placed in the subxiphoid region and two  5  mm trocars placed in the right upper quadrant. We could identify the liver  and fundus of the gallbladder. The fundus was elevated. There were extensive  adhesions to the gallbladder which took about 10 minutes to completely take  down. We were ultimately able to dissect out the infundibulum of the  gallbladder and dissect out the cystic duct and the cystic artery. The  cystic artery was controlled with metal clips and divided as it went onto  the gallbladder. The cystic duct was isolated. A metal clip was placed on  the cystic duct close to the gallbladder. A cholangiogram catheter was  inserted into the cystic duct. A cholangiogram was obtained using the C-arm.  The cholangiogram showed normal intrahepatic and extrahepatic biliary ducts.  There was no filling defect. There was no obstruction and there was good  flow of contrast into the duodenum. The cholangiogram catheter was removed.  The cystic duct was secured with multiple metal clips and divided. The  gallbladder was dissected from its bed with electrocautery and removed  through the umbilical port. The operative field was copiously irrigated. The  bed of the gallbladder was slightly hemorrhagic but with electrocautery it  became completely hemostatic and the irrigation fluid  was completely clear.  At the completion of the case, there was no bleeding and no bile leak  whatsoever. The trocars were removed under direct vision and there was no  bleeding from the trocar sites. Pneumoperitoneum was released. The fascia at  the umbilicus was closed with #0 Vicryl sutures. The  skin incision were closed with subcuticular sutures of 4-0 Monocryl and  Steri-Strips. Clean bandages were placed and the patient taken to the  recovery room in stable condition. Estimated blood loss was about 20-30 mL.  Complications none. Sponge, needle and instrument counts were correct.      Angelia Mould. Derrell Lolling, M.D.  Electronically Signed     HMI/MEDQ  D:   07/13/2005  T:  07/13/2005  Job:  [id number]   cc:   [facility]

## 2011-03-27 NOTE — Op Note (Signed)
NAME:  Angela [name], Angela [name]                          ACCOUNT NO.:  [id number]   MEDICAL RECORD NO.:  [id number]                   PATIENT TYPE:  INP   LOCATION:  3021                                 FACILITY:  [facility]   PHYSICIAN:  Hewitt Shorts, M.D.            DATE OF BIRTH:  11/05/1936   DATE OF PROCEDURE:  10/11/2003  DATE OF DISCHARGE:                                 OPERATIVE REPORT   PREOPERATIVE DIAGNOSIS:  Rheumatoid arthritis, destruction at the C1-2 level  with instability on flexion and extension.  Anterolisthesis of C1 on C2.  Cervical stenosis with myelopathy.   POSTOPERATIVE DIAGNOSIS:  Rheumatoid arthritis, destruction at the C1-2  level with instability on flexion and extension.  Anterolisthesis of C1 on  C2.  Cervical stenosis with myelopathy.   OPERATION PERFORMED:  C1 laminectomy with microdissection, occiput to  cervical (C2, C3, C4) arthrodesis with vertex posterior instrumentation,  Mastergraft, INFUSE and Vitoss with C-arm fluoroscopy.   SURGEON:  Hewitt Shorts, M.D.   ASSISTANT:  Clydene Fake, M.D.   ANESTHESIA:  General endotracheal.   INDICATIONS FOR PROCEDURE:  The patient is a 75 year old woman who has a  longstanding history of rheumatoid arthritis who has developed progressive  myelopathy who was found on MRI scan to have destructive changes involving  the atlanto-axial joint with anterolisthesis of C1 on C2 and dynamic  instability on flexion extension with stenosis at the C1-2 level with  myelopathy both on a clinical basis as well as evidence of increasing  __________ spinal cord at the C1-2 level.  Decision was made to proceed with  decompression and arthrodesis.   DESCRIPTION OF PROCEDURE:  The patient was brought to the operating room and  placed under general endotracheal anesthesia.  The patient had a three-pin  Mayfield head holder applied and the patient was carefully turned to a prone  position and the neck was positioned and  x-ray was taken to achieve a good  neutral position.  The occipital scalp was shaved and the occipital scalp  and neck were prepped with Betadine soap and solution and draped in sterile  fashion.  A midline incision was made.  The midline was infiltrated with  local anesthetic with epinephrine.  Dissection was carried down to the  subcutaneous tissue.  Bipolar cautery and electrocautery were used to  maintain hemostasis.  Dissection was carried down to the occiput as well as  the spinous process of C2, C3 and C4 and dissection was carried down  exposing the spinous process and lamina of C1, C2, C3 and C4 as well as the  occiput.  The operating microscope was draped and brought into the field to  provide additional magnification, illumination and visualization and we  carefully dissected the soft tissue from the lamina of C1 and then a  laminectomy was performed using the [facility] Max drill and Kerrison punches 1  and 2 mm in size  with thin foot plates.  Good decompression of the thecal  sac was achieved.  We then carefully identified the medial aspect of the C2  pedicle bilaterally and the dissection was carried out laterally exposing  the articular joint at C2-3 and C3-4 as well as the lateral masses.  We then  identified the lateral mass entry sites at C3 and C4 bilaterally.  These  were identified with an awl and then using a trajectory of 30 degrees  rostrally and 30 degrees laterally, the lateral mass was drilled and  examined with a depth gage.  We selected a 3.5 x 12 mm screw.  We used at  each level.  Each screw hole was tacked at the posterior cortex and the four  screws were placed with C-arm fluoroscopic guidance.  We then identified the  posterior entry point for the pedicle of C2.  This was identified along with  the medial aspect of the pedicle.  The posterior cortex was perforated with  an awl and then using C-arm fluoroscopic guidance and a trajectory of 25  degrees rostrally  and 25 degrees medially, the pedicle at C2 was drilled.  This was examined sequentially with depth gauge, no cutouts were noted.  It  was drilled to a depth of 20 mm and we selected a 3.5 x 20 mm screw which  was placed bilaterally.  An AP film showed good medial trajectory at C2 and  lateral trajectory at C3 and C4.  We then used a template to contour rods to  be used bilaterally and the links for the occipital screws were placed on  the rod.  The rods were secured in place with locking caps to the screws at  C2 through C4 and the occiput had three screws placed on each side.  Each  screw hole was carefully drilled and tapped.  We placed 8 mm screws  superiorly and medially and 6 mm screws at the lower and slightly lateral  locations.  All six screws were fully tightened and then the links to the  rod were tightened.  The locking caps were tightened with a countertorque at  each of the cervical screws.  We then placed a cross link which was  tightened with locking caps.  We then decorticated the surface of the  occiput as well as the lamina of C2, C3, C4 as well as the spinous process  at each of those levels.  We had previously opened the facet joint at C2-3,  C3-4.  These were cleaned of soft tissue.  We then took 10 mL of  Mastergraft.  It was laid over the C2 through C4 levels.  A 50 mm strip of  Gelfoam was placed over the occiput to C2 area and then caution sponge  soaked in INFUSE (BMP) was laid over the Vitoss and Mastergraft and  additional Mastergraft was placed over the INFUSE sponge.  The wound was  then closed in multiple layers. The paraspinal muscles were approximated  with interrupted undyed 0 Vicryl sutures, the deep fascia closed with  interrupted 1 and 0 undyed Vicryl sutures and the subcutaneous layer closed  with interrupted inverted 0 undyed Vicryl sutures and the subcutaneous and  subcuticular layer closed with interrupted inverted 2-0 undyed Vicryl sutures.  Skin  edges were closed with surgical staples.  The wound was  dressed with Adaptic, sterile gauze, Elastoplast and HypaFix.  Following  surgery, the Aspen collar with thoracic extension was placed with its  posterior aspect  and the patient was turned back to supine position.  The  three-pin Mayfield head holder was removed.  She was then to be reversed  from anesthetic and extubated.  The anterior portion of the Aspen collar  with thoracic extension was placed and the patient was then to be  transported to the recovery room for further care.  The estimated blood loss  for this procedure was 100 mL.  Sponge count correct.                                               Hewitt Shorts, M.D.    RWN/MEDQ  D:  10/11/2003  T:  10/12/2003  Job:  [id number]

## 2011-03-27 NOTE — Discharge Summary (Signed)
[place]  Patient:    Angela [name], Angela [name]                       MRN: [id number] Adm. Date:  [id number] Disc. Date: [id number] Attending:  Herold Harms Dictator:   Bynum Bellows. Ervin Knack, P.A.-C. CC:         Jaclyn Prime. Lucas Mallow, M.D.             Armandina Gemma, M.D.             Lilia Pro, M.D.             John L. Dorothyann Gibbs, M.D.             Daniel L. Thomasena Edis, M.D.                           Discharge Summary  DIAGNOSES:  1. Status post left total knee arthroplasty.  2. Rheumatoid arthritis.  3. Hypertension.  4. Hyperthyroidism previously treated with radiation.  HOSPITAL COURSE:  The patient is a 75 year old female with a history of rheumatoid arthritis and status post right total knee arthroplasty in 1991 who had increasing left knee pain and elected to undergo left total knee arthroplasty February 26, 2000, by Dr. Priscille Kluver.  She was placed on Coumadin for DVT prophylaxis and allowed weightbearing as tolerated.  It was noted that the patient lived in a one-level home with three to five steps to entry and she was ambulating 8 feet with a platform walker due to deformity of upper extremities from rheumatoid arthritis.  It was felt that the patient would require inpatient rehab prior to returning home.  Therefore, she was admitted to inpatient rehab February 29, 2000, where she has participated in physical and occupational therapies.  While on the rehab unit, the patient did have some problems with muscle spasm for which she was placed on skelaxin.  She also noted increased swelling in her left knee and a venous Doppler exam was performed which did rule out DVT.  Patient has progressed now to the point where she is considered a suitable candidate for discharge to home as she is modified independent with her bed mobility, transfers, and ambulation.  DISCHARGE MEDICATIONS:  1. Coumadin 2.5 mg daily or as directed until May 24.  2. OxyContin CR 10 mg  every 12 hours for one week.  3. Percocet one or two every 4 hours as needed.  4. Senokot two once a day.  5. Premarin 1.25 mg daily.  6. Provera 10 mg daily.  7. Prilosec 20 mg daily.  8. Lasix 20 mg daily.  9. Potassium 10 mEq daily. 10. Diovan 80 mg daily. 11. Methotrexate 7.5 mg every Sunday. 12. Os-Cal 1000 mg daily. 13. Prednisone 5 mg daily.  ACTIVITY:  Weightbearing as tolerated.  She is asked to follow a balanced diet, keep her wound clean and dry.  She will have home health, physical, and occupational therapy, as well as a nurse will draw her pro time Thursday, May 9, results to ______  She is instructed to follow up with Dr. Priscille Kluver in two weeks.  CONDITION ON DISCHARGE:  Stable. DD:  03/12/00 TD:  03/12/00 Job: [id number]

## 2011-03-27 NOTE — Op Note (Signed)
NAME:  BRIZEYDA, HOLTMEYER                          ACCOUNT NO.:  [id number]   MEDICAL RECORD NO.:  [id number]                   PATIENT TYPE:  INP   LOCATION:  3021                                 FACILITY:  [facility]   PHYSICIAN:  Hewitt Shorts, M.D.            DATE OF BIRTH:  May 26, 1936   DATE OF PROCEDURE:  10/19/2003  DATE OF DISCHARGE:                                 OPERATIVE REPORT   PREOPERATIVE DIAGNOSIS:  Postoperative stenosis at C1-2 level posteriorly.   POSTOPERATIVE DIAGNOSIS:  Postoperative stenosis at C1-2 level posteriorly.   PROCEDURE:  C2 laminectomy, suboccipital craniectomy, decompression at the  C1-2 level, decompression of the foramen magnum and removal and placement of  vertex cross-link.   SURGEON:  Hewitt Shorts, M.D.   ANESTHESIA:  General endotracheal.   INDICATION:  The patient is a 75 year old woman who is eight days status  post a C1 laminectomy and an occipitocervical arthrodesis with vertex  posterior instrumentation.   The patient had done well initially but then gradually, after three or four  days, began to develop numbness in her hands and gradual worsening of her  preexisting quadriparesis.  The patient was restudied yesterday with MRI  which revealed dorsal compression at the C1-2 level due to granulation  tissue and decision made to reexplore the exposure and decompress the C1-2  level.   PROCEDURE:  The patient was brought to the operating room and placed under  general oral endotracheal anesthesia.  A three-pin Mayfield head holder was  applied and the patient was carefully turned to a prone position.  The wound  was prepped with isopropyl alcohol and the surgical staples removed and the  wound was then prepped with Betadine soaping solution and draped in a  sterile fashion.  The upper two-thirds of the previous incision was reopened  and dissection was carried down through the previous surgical plane.  Vicryl  sutures were  carefully removed.  We exposed down to the suboccipital C1 and  C2 level.  There was a small amount of seroma within the previous exposure  and we then removed the cross-link to improve our exposure and performed a  C2 laminectomy using double-action rongeur, double-action Max drill and a 2-  mm Kerrison punch with a thin footplate.  Granulation tissue in the previous  surgical exposure was carefully dissected from the dura as necessary and we  did remove the upper edge of the C3 lamina so that we would be able to  remove the ligamentum flavum at the C2-3 level.  The ligamentum flavum was  removed at the C1-2 level.  We then did a suboccipital craniectomy between  the area of the screws and rods on either side to expose the  occipitoatlantal membrane posteriorly; this was carefully divided and  removed, exposing the dural surface of the foramen magnum.  It was felt that  good decompression was achieved by the C2 laminectomy  as well as  decompression of the foramen magnum.  The wound was irrigated extensively  throughout the procedure with Bacitracin solution.  Hemostasis was  established.  A new cross-link was placed above the level of the C2 screws  and locked down with set screws and then we proceeded with closure, closing  the layers with multiple layers, closing the paraspinal muscles with  interrupted undyed 0 Vicryl sutures, the deep fascia with interrupted undyed  0 Vicryl sutures and the subcutaneous and subcuticular layers were closed  with interrupted-inverted 2-0 and 3-0 undyed Vicryl sutures.  The skin was  closed with surgical staples.  The wound was dressed with Adaptic and  sterile gauze.  The procedure was tolerated well.  The estimated blood loss  was 100 mL.  Sponge and needle counts were correct.  The posterior aspect of  the cervicothoracic orthosis was applied and then the patient  was carefully turned back to a supine position, the three-pin Mayfield head  holder was  removed and the ventral aspect of the cervicothoracic orthosis  was applied and secured.  The patient was then to be reversed from  anesthetic, to be extubated and transferred to the recovery room for further  care.                                               Hewitt Shorts, M.D.    RWN/MEDQ  D:  10/19/2003  T:  10/19/2003  Job:  [id number]

## 2011-03-27 NOTE — Discharge Summary (Signed)
NAME:  Angela [name], Angela [name] NO.:  [id number]   MEDICAL RECORD NO.:  [id number]                   PATIENT TYPE:  IPS   LOCATION:  4007                                 FACILITY:  [facility]   PHYSICIAN:  [name], M.D.                DATE OF BIRTH:  Dec 11, 1935   DATE OF ADMISSION:  10/21/2003  DATE OF DISCHARGE:  10/29/2003                                 DISCHARGE SUMMARY   DISCHARGE DIAGNOSES:  1. Status post C1-C2 laminectomy secondary to stenosis.  2. History of rheumatoid arthritis.  3. Urinary retention.  4. History of hypertension.  5. History of depression.  6. History of elevated cholesterol.  7. Asthma.   HISTORY OF PRESENT ILLNESS:  [name] with  past history of rheumatoid arthritis with several joint replacements and  well known to rehab, hypertension, and generalized weakness leading to  preexisting quadriparesis, admitted on October 11, 2003, for C1 laminectomy  with microdiskectomy and arthrodesis secondary to cervical stenosis and  myelopathy with rheumatoid destruction at the C1-C2 level by Dr. Shirlean Kelly.  Unfortunately postoperatively, the patient developed numbness and  heaviness in arms with progressive weakness.  Repeat MRI performed revealed  canal stenosis at the C2 level.  The patient underwent a C2 laminectomy with  decompression of C1-C2 on October 19, 2003, by Dr. Shirlean Kelly.  PT  report at this time indicated moderate assistance ambulate 25 feet with  rolling walker, bed mobility moderate assistance.  The patient given DVT  prophylaxis.  The patient was transferred to [facility]  Department on October 21, 2003.   PAST MEDICAL HISTORY:  1. Asthma as above.  2. Depression.  3. Cholesterol.  4. Reflux.   PAST SURGICAL HISTORY:  1. Bilateral knee replacement.  2. Ankle fusion.  3. Joint replacement in hands.  4. Thoracectomy.   PRIMARY CARE PHYSICIAN:  Ike Bene, M.D.   RHEUMATOLOGIST:  Vonzell Schlatter. Patsi Sears, M.D.   SOCIAL HISTORY:  The patient is married and lives with husband in one-level  home in [place], two steps to entry.  Retired Retail buyer.  Husband can assist at time of discharge.   FAMILY HISTORY:  Noncontributory.   ALLERGIES:  CODEINE, PEANUTS and SHRIMP.   REVIEW OF SYSTEMS:  Significant for joint pain, joint swelling, urinary  incontinence.   HOSPITAL COURSE:  [name] was admitted to [facility]  Department on December 16, 20004, for comprehensive inpatient rehabilitation  where she received more than 3 hours of therapy daily.  Hospital course  significant for following.   PROBLEM #1.  C1-C2 LAMINECTOMY WITH DECOMPRESSION SECONDARY TO STENOSIS:  Overall, [name] progressed fairly well during her nine-day stay in  rehab.  The patient was placed on Lovenox for DVT prophylaxis.  Surgical  incision healed fairly well with mild erythema around the staples and  staples  discontinued prior to discharge.  The patient was seen periodically  by Dr. Newell Coral.  Overall, the patient was discharged at minimum assistance  to moderate assistance level.  She is able to ambulate 150 feet with rolling  walker supervision level and transfer sit to stand supervision level and bed  mobility minimum assistance level.  The patient could perform most ADLs  minimum assistance to moderate assistance.  The patient's husband received  family education and able to assist patient at time of discharge.  The  patient did regain muscles strength in arms and legs.  As far as pain  management, she remained on OxyContin as well as oxycodone, Flexeril for  muscle spasm, and Celebrex.  The patient remained in thoracic collar at all  times.   PROBLEM #2.  HISTORY OF RHEUMATOID ARTHRITIS:  The patient remained on  predinsone 5 mg p.o. daily as well as Celebrex 200 mg p.o. b.i.d.   PROBLEM #3.  ASTHMA:  The  patient remained on Atrovent as well as Combivent  and Advair.  No significant asthma exacerbation occurred.   PROBLEM #4.  URINARY RETENTION:  Initially patient was on Detrol for history  of urinary incontinence.  Detrol was discontinued due to  severe urinary  retention.  The patient was started on Flomax as well as taper of  Urecholine.  In and out catheterization had to be performed.  The patient  was initially place don Urecholine on October 27, 2003, at 10 mg p.o.  b.i.d.  This was increased to 25 mg p.o. t.i.d. on October 28, 2003.  The  patient began to void some but still had some PVRs greater than 300.  The  patient's husband received education about in and out catheterization.  The  patient also will have a relative to help perform in and out catheterization  as needed.  The patient is to see Dr. Thomasena Edis back within three to four  weeks to monitor urinary retention and bladder.   PROBLEM #5.  HYPERCHOLESTEROLEMIA :  She remained on Zocor 40 mg p.o. q.h.s.  Liver function remained stable.   Other than constipation and anxiety, there were no other major issues that  occurred while patient was on rehab.  She received Ativan as needed and  sorbitol and Dulcolax suppositories as needed for bowels.   Latest labs indicate urine culture performed on October 22, 2003, is no  growth x1 day.  Latest hemoglobin 12.4, hematocrit 36.4, white blood cell  count 5.9, platelet count 271,000.  Latest sodium 141, potassium 3.9,  chloride 112, glucose 100, BUN 7, creatinine 0.7, calcium 8.3.  AST 14, ALT  13, alkaline phosphatase 46.   At time of discharge, all vital signs were stable.  Blood pressure 130/60,  respiratory rate 20, temperature 97.1, pulse 79.  She did have urine PVRs  with 386 and 367, but the patient was voiding some.   PT report at time of discharge indicates patient can ambulate approximately  150 feet supervision level with rolling walker, transfer sit to  stand supervision level, bed mobility minimum assistance level.  Able to perform  most ADLs moderate assistance to minimum assistance level.  It was thought  this was patient's baseline, ADLs.  Overall, the patient progressed well and  met all long-term goals from physical therapy standpoint.  From OT  standpoint husband helped patient a great deal with ADLs.  The patient plans  to pursue home management goals and home health. Still regular moderate  assistance secondary to  decreased grip in bilateral hands.  At time of  discharge, sensation in lower extremities intact.  The patient still had  weakness in grip in bilateral hands.  The patient was discharged home with  he husband.   At the time of discharge, surgical incisions without erythema, staples  removed.  No signs of infection.   DISCHARGE MEDICATIONS:  1. Flexeril 10 mg 3 times daily.  2. Resume Coumadin.  3. Provera.  4. Advair.  5. Combivent.  6. Lexapro.  7. OxyContin 10 mg daily.  8. Lasix 20 mg daily.  9. Calcium 1000 mg daily.  10.      Zocor 40 mg daily.  11.      Prednisone 5 mg daily.  12.      Celebrex 200 mg twice daily.  13.      Xanax 0.5 mg at night.  14.      Urecholine 25 mg 3 times daily and follow taper.  15.      Pain medication are OxyContin and oxycodone.   Do not take Detrol for now.   DISCHARGE INSTRUCTIONS:  1. Wear brace when sitting, standing, walking.  2. Activity: No driving.  3. No alcohol, no smoking.  4. Use walker.  5. Will have [facility] for PT and OT.  6. She will follow up with Dr. [name] in four week.  Call for     appointment.  7. Follow up with Dr. Newell Coral in two weeks.  8. Follow up with Dr. Patsi Sears as needed.  9. Follow up with rheumatology as needed.      Junie Bame, P.A.                       [name], M.D.    LH/MEDQ  D:  10/29/2003  T:  10/31/2003  Job:  [id number]   cc:   Ike Bene, M.D.  [address]   [place]  [place] [address]  Fax: [phone]   Sigmund I. Patsi Sears, M.D.  [address]  [place]  [place] [address]  Fax: [phone]   Hewitt Shorts, M.D.  [address]  [place]  [place] [address]  Fax: [phone]

## 2011-03-27 NOTE — Discharge Summary (Signed)
[facility]  Patient:    SHONYA, SUMIDA                       MRN: [id number] Adm. Date:  [id number] Disc. Date: 02/29/00 Attending:  Herold Harms Dictator:   Arnoldo Morale, P.A.-C.                           Discharge Summary  ADMISSION DIAGNOSES: 1. End-stage osteoarthritis of the left knee. 2. Rheumatoid arthritis. 3. Hypertension. 4. Hyperthyroidism. 5. Gastroesophageal reflux disease. 6. Peptic ulcer disease. 7. Asthma.  DISCHARGE DIAGNOSES: 1. End-stage osteoarthritis of the left knee. 2. Rheumatoid arthritis. 3. Hypertension. 4. hyperthyroidism. 5. Gastroesophageal reflux disease. 6. Peptic ulcer disease. 7. Asthma. 8. Post-hemorrhagic anemia, acute.  PROCEDURE:  On February 26, 2000, Ms. Potier underwent a left total knee arthroplasty by Dr. Jonny Ruiz L. Rendall III.  Complications:  None.  CONSULTATION: 1. Pharmacy consult for Coumadin therapy on February 26, 2000. 2. Physical therapy consultation on February 27, 2000. 3. Rehabilitation medicine consultation on February 27, 2000. 4. Occupational therapy consultation on February 28, 2000.  HISTORY OF PRESENT ILLNESS:  This 75 year old white female with a significant history of rheumatoid arthritis presented to Dr. Priscille Kluver with a six to eight-month history of increasing weakness and pain in her left knee.  No previous injury or surgery of her knee, but does have a history of a right knee replacement 12 years ago.  The pain and weakness have been increasing over the last six to eight months, and is described as intermittent and throbbing over the anterolateral aspect of the knee, without radiation.  It increased with range of motion, exercise, and decreases with rest and medications.  She complains that the knee might give way at times.  Because of the difficulty she has had with this knee, she is presenting for a left total knee arthroplasty.  HOSPITAL COURSE:  Ms. Gilmer tolerated her  surgical procedure well without immediate postoperative complications.  She was subsequently transferred to [facility].  On the night after surgery she complained of some pruritus and chills, and her  epidural pain medicine was adjusted.  She was also given medications to help decrease the pruritus.  This did help with the pain.  On postoperative day one er T-Max was 100.8 degrees and her vital signs were stable.  Her hemoglobin was 10.5 with a hematocrit of 30.2.  Her dressing to the left knee was intact, with a moderate amount of bloody drainage, and she did have pain in her calf.  The lower extremity venous Doppler was obtained to rule out a DVT.  The epidural anesthesia was discontinued at 9 a.m. on February 27, 2000, and she was started on OxyContin or pain, and that controlled it well.  She was started on PT per protocol.  On postoperative day two she was doing much better with pain control, and the itching was less.  The T-Max was 101.4 degrees and the vital signs were stable.  Hemoglobin was 10.6 with a hematocrit of 30.3.  The left knee incision was well-approximated with staples, with minimal drainage, and slight ecchymosis. he calf was still slightly tender but the Doppler study had been negative.  She was continued on PT per protocol and hemoglobin and hematocrit were monitored.  On postoperative day three she remained afebrile.  Vital signs were stable, with her leg neurovascularly intact, and she was believed ready for transfer to  the rehabilitation until.  Plans were begun for this, but she did have a drop in her blood pressure that day.  Her blood pressure had been running low, and she got er antihypertensive, and it dropped her in the 70/50 range.  Her medical doctor was consulted over the phone, and a normal saline bolus was given, and her blood pressure was monitored, and by 2 p.m. that afternoon her blood pressure was back up to 110/70 with a pulse of 88 and  respirations 16.  She was believed stable for transfer to the rehabilitation unit, and she was transferred that day.  DISCHARGE INSTRUCTIONS:  She is to be weightbearing as tolerated on the left leg, with the use of the walker.  She is to continue physical therapy and occupational therapy per the rehabilitation protocol.  DISCHARGE MEDICATIONS: She is to continue all of her medications that she was on preoperatively including:  1. Prednisone 5 mg q.d.  2. Diovan 80 mg q.d.  3. Prilosec 20 mg q.d.  4. Lasix 20 mg q.d.  5. Premarin 1.25 mg p.o. q.d. on the 1st to 25th of the month.  6. Provera 10 mg p.o. q.d. on day 16 to 25 of the month.  7. Potassium SR one q.d.  8. Albuterol inhaler two puffs q.4h. p.r.n.  9. Calcium plus vitamin D 1000 mg p.o. q.d. 10. She is to be continued on OxyContin 20 mg p.o. q.12h. 11. Oxy IR 5 mg one to two p.o. 4-6h. p.r.n. pain. 12. Coumadin, with dose to be determined per pharmacy protocol.  FOLLOWUP:  She is to follow up with Dr. Priscille Kluver in our office in approximately wo weeks after surgery for the removal of her staples and her first postoperative check.  If her staples are removed prior to discharge from rehabilitation, then she can return to see Dr. Priscille Kluver in about two weeks after her discharge from rehabilitation.  Her staples should remain in place until approximately 14 days  after surgery.  LABORATORY DATA:  On February 22, 2000, hemoglobin 14.5, hematocrit 41.6.  On February 27, 2000, hemoglobin was 10.5, hematocrit 30.2.  On February 28, 2000, her hemoglobin was 10.6, with hematocrit of 30.3.  On February 29, 2000, her hemoglobin was 10.4,  with hematocrit of 29.2.  On February 22, 2000, PT was 13.8 seconds with an INR of 1.1 and a PTT of 26.  On February 29, 2000, her PT was 18.7 seconds with an INR of 2. On February 22, 2000, her glucose was 124.  On February 27, 2000, her sodium was 133, potassium 4.1, glucose 153, calcium 7.8.  On February 28, 2000, her  sodium was 132, potassium 4.4, glucose 168, calcium 7.8.  On February 29, 2000, her sodium was 132, potassium 4.6, glucose 124, and calcium 8.1.  All other laboratory studies were  within normal limits.   Lower extremity venous Doppler done on February 27, 2000, showed no evidence of lower extremity deep vein thrombosis, superficial thrombosis, or Bakers cyst bilaterally.  A chest x-ray done on February 22, 2000, showed mild cardiomegaly and evidence of what appears to be a chronic bronchitis and some scarring at the left lung base. DD:  03/05/00 TD:  03/06/00 Job: [id number]

## 2011-04-18 ENCOUNTER — Encounter: Payer: Medicare Other | Attending: Physical Medicine & Rehabilitation | Admitting: Neurosurgery

## 2011-04-18 DIAGNOSIS — I2510 Atherosclerotic heart disease of native coronary artery without angina pectoris: Secondary | ICD-10-CM | POA: Insufficient documentation

## 2011-04-18 DIAGNOSIS — Q762 Congenital spondylolisthesis: Secondary | ICD-10-CM

## 2011-04-18 DIAGNOSIS — M069 Rheumatoid arthritis, unspecified: Secondary | ICD-10-CM | POA: Insufficient documentation

## 2011-04-18 DIAGNOSIS — M4712 Other spondylosis with myelopathy, cervical region: Secondary | ICD-10-CM | POA: Insufficient documentation

## 2011-04-18 DIAGNOSIS — M4802 Spinal stenosis, cervical region: Secondary | ICD-10-CM

## 2011-04-19 NOTE — Assessment & Plan Note (Signed)
Ms. Arentz is back regarding her cervical stenosis, myelopathy, and RA. She has been followed by Dr. Riley Kill in the past and states she is doing better on the actual regimen he has room now for his medicines.  She rates her pain at about a 5 or 6.  It is a sharp, aching-type pain. Walking, bending, and most activities aggravate and medication tends to help.  She is on disability.  She uses a walker and walks with assistance, needs some help with the wheelchair occasionally.  REVIEW OF SYSTEMS:  Otherwise unremarkable except for mild cough.  PAST MEDICAL HISTORY:  Unchanged.  SOCIAL HISTORY:  She is married.  She lives with her husband.  FAMILY HISTORY:  Unchanged.  PHYSICAL EXAMINATION:  VITAL SIGNS:  Her blood pressure is 119/59, pulse 69, respirations 18, O2 sats 95 on room air. NEUROLOGIC:  Her motor strength is weak in the lower extremities.  Her sensation appears to be intact. CONSTITUTIONAL:  She is within normal limits.  She is in a wheelchair today.  She is alert and oriented x3, affect is bright and doing well.  ASSESSMENT: 1. Cervical stenosis, myelopathy. 2. Rheumatoid arthritis. 3. Lumbar spondylolisthesis. 4. Coronary artery disease.  PLAN: 1. She is doing well on her current medication regimen, we will leave     her on that. 2. Percocet refilled 7.5/325 one p.o. q.6 h. p.r.n. pain, 120, she     received 2 prescriptions, one for this month and one for next     month. 3. She decided to stay with Dr. Riley Kill for all her medicines instead     of switching to Dr. Valentina Lucks for her Percocet and that is     misunderstood, prescription was given today. 4. She will stay with her current regiment of Voltaren gel, Lidoderm,     and Biofreeze. 5. She will follow up with Dr. Riley Kill in 2 months.  Her questions were     encouraged and answered.     Nadeem Romanoski L. Blima Dessert Electronically Signed    RLW/MedQ D:  04/18/2011 15:24:09  T:  04/19/2011 06:48:18  Job #:   [id number]

## 2011-06-11 ENCOUNTER — Other Ambulatory Visit: Payer: Self-pay | Admitting: Internal Medicine

## 2011-06-11 DIAGNOSIS — Z1231 Encounter for screening mammogram for malignant neoplasm of breast: Secondary | ICD-10-CM

## 2011-06-22 ENCOUNTER — Encounter: Payer: Medicare Other | Attending: Physical Medicine & Rehabilitation | Admitting: Physical Medicine & Rehabilitation

## 2011-06-22 DIAGNOSIS — Q762 Congenital spondylolisthesis: Secondary | ICD-10-CM | POA: Insufficient documentation

## 2011-06-22 DIAGNOSIS — Z79899 Other long term (current) drug therapy: Secondary | ICD-10-CM | POA: Insufficient documentation

## 2011-06-22 DIAGNOSIS — M4802 Spinal stenosis, cervical region: Secondary | ICD-10-CM | POA: Insufficient documentation

## 2011-06-22 DIAGNOSIS — M069 Rheumatoid arthritis, unspecified: Secondary | ICD-10-CM

## 2011-06-22 DIAGNOSIS — M4712 Other spondylosis with myelopathy, cervical region: Secondary | ICD-10-CM

## 2011-06-22 DIAGNOSIS — I2510 Atherosclerotic heart disease of native coronary artery without angina pectoris: Secondary | ICD-10-CM | POA: Insufficient documentation

## 2011-06-22 DIAGNOSIS — M542 Cervicalgia: Secondary | ICD-10-CM | POA: Insufficient documentation

## 2011-06-22 DIAGNOSIS — M4800 Spinal stenosis, site unspecified: Secondary | ICD-10-CM

## 2011-06-22 NOTE — Assessment & Plan Note (Signed)
HISTORY:  Angela [name] is back regarding her rheumatoid arthritis and chronic pain syndrome.  She is having some increased neck pain recently.  Her rheumatologist ordered C-spine films and awaiting on results.  We tried to look these up on the chart and they were unavailable through E-chart. Pain overall is stable, otherwise rating from 3-5/10.  She has occasional tingling and aching.  She likes Voltaren gel, uses on her neck, hands, elbows, low back, and feet.  Uses the Percocet for breakthrough pain.  REVIEW OF SYSTEMS:  Notable for the above.  Full 12-point review is in the written health and history section of the chart.  SOCIAL HISTORY:  The patient is married, living with her husband.  PHYSICAL EXAMINATION:  VITAL SIGNS:  Blood pressure 124/67, pulse 71, respiratory rate 16, and she is satting 97% on room air. GENERAL:  The patient is pleasant, alert, and oriented x3.  Affect showing bright and appropriate.  She seems to be sitting with good posture.  She has a flexion and extension of the neck, but it is somewhat limited.  There was no additional pain with movement and I can discern today.  Cognitively, she is alert and appropriate.  Cranial nerve exam is intact.  ASSESSMENT: 1. Cervical stenosis with history of myelopathy. 2. Rheumatoid arthritis. 3. Lumbar spondylolisthesis. 4. Coronary artery disease.  PLAN: 1. Refill Percocet 7.5, #120 with a prescription for next month. 2. Refill her Voltaren gel 5 tubes tid to qid to be used her affected     areas.  This works well, and she uses in addtion to [employer]. 3. Follow up with my nurse practitioner in 2 months and I will see her     back in 6 months' time.     Ranelle Oyster, M.D. Electronically Signed    ZTS/MedQ D:  06/22/2011 13:34:30  T:  06/22/2011 18:57:16  Job #:  [id number]  cc:   Thora Lance, M.D. Fax: [phone]

## 2011-07-12 ENCOUNTER — Other Ambulatory Visit: Payer: Self-pay | Admitting: Obstetrics and Gynecology

## 2011-07-23 ENCOUNTER — Ambulatory Visit
Admission: RE | Admit: 2011-07-23 | Discharge: 2011-07-23 | Disposition: A | Discharge status: Home / Self Care | Payer: Medicare Other | Source: Ambulatory Visit | Attending: Internal Medicine | Admitting: Internal Medicine

## 2011-07-23 DIAGNOSIS — Z1231 Encounter for screening mammogram for malignant neoplasm of breast: Secondary | ICD-10-CM

## 2011-08-16 ENCOUNTER — Encounter: Payer: Medicare Other | Attending: Neurosurgery | Admitting: Neurosurgery

## 2011-08-16 DIAGNOSIS — M62838 Other muscle spasm: Secondary | ICD-10-CM | POA: Insufficient documentation

## 2011-08-16 DIAGNOSIS — M069 Rheumatoid arthritis, unspecified: Secondary | ICD-10-CM | POA: Insufficient documentation

## 2011-08-16 DIAGNOSIS — M4802 Spinal stenosis, cervical region: Secondary | ICD-10-CM | POA: Insufficient documentation

## 2011-08-16 DIAGNOSIS — I2510 Atherosclerotic heart disease of native coronary artery without angina pectoris: Secondary | ICD-10-CM | POA: Insufficient documentation

## 2011-08-16 DIAGNOSIS — M542 Cervicalgia: Secondary | ICD-10-CM | POA: Insufficient documentation

## 2011-08-16 DIAGNOSIS — M47817 Spondylosis without myelopathy or radiculopathy, lumbosacral region: Secondary | ICD-10-CM | POA: Insufficient documentation

## 2011-08-16 DIAGNOSIS — M4712 Other spondylosis with myelopathy, cervical region: Secondary | ICD-10-CM | POA: Insufficient documentation

## 2011-08-16 NOTE — Assessment & Plan Note (Signed)
This is a patient with rheumatoid arthritis and chronic pain syndrome. She has had some occipital and neck pain.  She states she may return to Dr. Newell Coral.  Otherwise she rates her pain at a 5.  It is a stabbing type pain.  General activity level is 6.  Pain is worse in the morning and night.  Sleep patterns are fair.  Pain is worse with bending and sitting.  Therapy, pacing, and medication tend to help.  She walks with assistance.  She uses a walker.  She is in a wheelchair today with her husband.  She needs help with transfer.  She can only walk a couple of minutes at a time. She does not climb steps or drive.  Functionally she is not employed.  She needs help with ADLs, bathing, household duties, shopping.  REVIEW OF SYSTEMS:  Notable for difficulties described above. Otherwise, trouble walking and spasms.  No suicidal thoughts or aberrant behaviors.  Pill counts correct.  Last UDS was consistent.  PAST MEDICAL HISTORY/SOCIAL HISTORY/FAMILY HISTORY:  Unchanged.  PHYSICAL EXAMINATION:  Blood pressure is 131/48, pulse 66, respirations 16, O2 sats 95 on room air.  She is alert and oriented x3. Constitutionally she is within normal limits.  Sensation is intact.  ASSESSMENT: 1. History of cervical stenosis with myelopathy. 2. Rheumatoid arthritis. 3. Lumbar spondylolisthesis. 4. Coronary artery disease.  PLAN: 1. She will follow up with Dr. Newell Coral regarding her neck as needed. 2. She was given 2 prescriptions, 1 for this month, 1 for next month     Percocet 7.5/325, 1 p.o. q.6 hours p.r.n. #120 with no refill on     both.  I will see her back here in the clinic in 1 month.  She will     continue her Lidoderm and Voltaren gel as needed.  Her questions     were encouraged and answered.     Fergus Throne L. Blima Dessert Electronically Signed    RLW/MedQ D:  08/16/2011 13:13:32  T:  08/16/2011 22:11:14  Job #:  [id number]

## 2011-09-06 HISTORY — PX: FRACTURE SURGERY: SHX138

## 2011-10-11 ENCOUNTER — Encounter: Payer: Medicare Other | Attending: Neurosurgery | Admitting: Neurosurgery

## 2011-10-11 DIAGNOSIS — Q762 Congenital spondylolisthesis: Secondary | ICD-10-CM | POA: Insufficient documentation

## 2011-10-11 DIAGNOSIS — M4712 Other spondylosis with myelopathy, cervical region: Secondary | ICD-10-CM | POA: Insufficient documentation

## 2011-10-11 DIAGNOSIS — I2510 Atherosclerotic heart disease of native coronary artery without angina pectoris: Secondary | ICD-10-CM | POA: Insufficient documentation

## 2011-10-11 DIAGNOSIS — G8929 Other chronic pain: Secondary | ICD-10-CM | POA: Insufficient documentation

## 2011-10-11 DIAGNOSIS — M79609 Pain in unspecified limb: Secondary | ICD-10-CM | POA: Insufficient documentation

## 2011-10-11 DIAGNOSIS — M545 Low back pain, unspecified: Secondary | ICD-10-CM | POA: Insufficient documentation

## 2011-10-11 DIAGNOSIS — M069 Rheumatoid arthritis, unspecified: Secondary | ICD-10-CM

## 2011-10-11 DIAGNOSIS — M4802 Spinal stenosis, cervical region: Secondary | ICD-10-CM

## 2011-10-12 NOTE — Assessment & Plan Note (Signed)
This is a patient of Dr. Riley Kill, seen for chronic low back pain and some leg pain.  She rates her average pain at a 4-5.  It is a dull aching type pain.  General activity level is 6-7.  Pain is worse in the morning and evening.  Sleep patterns are good.  Pain is worse with walking, bending, standing, and activity.  Rest, heat, and medication tend to help.  She uses a walker or cane.  She does not drive or climb steps. She uses a wheelchair for mobilization.  She is on disability.  She needs help with ADLs and household duties.  REVIEW OF SYSTEMS:  Notable for difficulties as described above as well as some weight fluctuation, easy bleeding, limb swelling, trouble walking, numbness, anxiety.  No suicidal thoughts or aberrant behaviors. Last pill count, UDS is consistent.  PAST MEDICAL HISTORY:  Unchanged.  SOCIAL HISTORY:  Unchanged.  FAMILY HISTORY:  Unchanged.  PHYSICAL EXAMINATION:  VITAL SIGNS:  Blood pressure is 111/57, pulse 68, respirations 14, O2 sats 100 on room air. NEUROLOGIC:  Motor strength and sensation appear to be intact, and she is diminished in the lower extremities.  She does have left ankle surgery with Dr. Priscille Kluver and she is recovering from that. Constitutionally, she is within normal limits.  She is alert and oriented x3.  She does use a wheelchair for mobilization.  ASSESSMENT: 1. History of cervical stenosis with myelopathy. 2. Rheumatoid arthritis. 3. Lumbar spondylolisthesis. 4. Coronary artery disease.  PLAN:  She is given 3 prescriptions for Percocet 7.5/325 1 p.o. q.6 hours p.r.n., #120 with no refill, one for this month and one for next month.  Her questions were encouraged and answered.  We will see her in 2 months.     Angela [name] L. Blima Dessert Electronically Signed    RLW/MedQ D:  10/11/2011 13:38:11  T:  10/12/2011 01:43:53  Job #:  [id number]

## 2011-10-17 ENCOUNTER — Other Ambulatory Visit: Payer: Self-pay

## 2011-12-06 ENCOUNTER — Encounter: Payer: Medicare Other | Attending: Neurosurgery | Admitting: Neurosurgery

## 2011-12-06 DIAGNOSIS — M069 Rheumatoid arthritis, unspecified: Secondary | ICD-10-CM | POA: Insufficient documentation

## 2011-12-06 DIAGNOSIS — Q762 Congenital spondylolisthesis: Secondary | ICD-10-CM | POA: Insufficient documentation

## 2011-12-06 DIAGNOSIS — M4712 Other spondylosis with myelopathy, cervical region: Secondary | ICD-10-CM | POA: Insufficient documentation

## 2011-12-06 DIAGNOSIS — M4802 Spinal stenosis, cervical region: Secondary | ICD-10-CM

## 2011-12-07 NOTE — Assessment & Plan Note (Signed)
This is a patient of Dr. Riley Kill, seen for history of cervical stenosis with myelopathy as well as rheumatoid arthritis and lumbar spondylolisthesis.  The patient reports her pain at a 5 or 6.  It is sharp to aching type pain.  She states she has appointment with Dr. Shirlean Kelly in a couple of weeks to assess her neck.  General activity level is 5 or 6.  Pain is worse in the morning.  Sleep patterns are good.  Walking, bending, standing, and activity aggravate.  Rest and medication helps.  She uses a walker or wheelchair for mobility. Functionally, she is on disability.  She needs help with ADLs and household duties.  REVIEW OF SYSTEMS:  Notable for the difficulties as described above as well as some bladder control issues, numbness, trouble walking.  No suicidal thoughts or aberrant behaviors.  Last pill count and UDS consistent.  PAST MEDICAL HISTORY, SOCIAL HISTORY, AND FAMILY HISTORY:  Unchanged.  PHYSICAL EXAMINATION:  Her blood pressure is 121/37, pulse 53, respirations 16, O2 sats 94 on room air.  Her motor strength and sensation are intact, somewhat diminished throughout.  Constitutionally, she is within normal limits.  She is alert and oriented x3.  ASSESSMENT: 1. History of cervical stenosis with myelopathy. 2. Rheumatoid arthritis. 3. Lumbar spondylolisthesis.  PLAN:  She was given one prescription for Percocet 7.5/325 1 p.o. q.6 hours p.r.n., 120 with no refill.  Her questions were encouraged and answered.  She will follow up with Dr. Riley Kill in 2 months.     Jaecion Dempster L. Blima Dessert Electronically Signed    RLW/MedQ D:  12/06/2011 13:22:28  T:  12/07/2011 02:52:19  Job #:  [id number]

## 2011-12-14 DIAGNOSIS — M5030 Other cervical disc degeneration, unspecified cervical region: Secondary | ICD-10-CM | POA: Diagnosis not present

## 2011-12-14 DIAGNOSIS — M542 Cervicalgia: Secondary | ICD-10-CM | POA: Diagnosis not present

## 2011-12-14 DIAGNOSIS — R51 Headache: Secondary | ICD-10-CM | POA: Diagnosis not present

## 2011-12-14 DIAGNOSIS — M47812 Spondylosis without myelopathy or radiculopathy, cervical region: Secondary | ICD-10-CM | POA: Diagnosis not present

## 2012-01-02 DIAGNOSIS — Z79899 Other long term (current) drug therapy: Secondary | ICD-10-CM | POA: Diagnosis not present

## 2012-01-02 DIAGNOSIS — M542 Cervicalgia: Secondary | ICD-10-CM | POA: Diagnosis not present

## 2012-01-02 DIAGNOSIS — M545 Low back pain: Secondary | ICD-10-CM | POA: Diagnosis not present

## 2012-01-02 DIAGNOSIS — M069 Rheumatoid arthritis, unspecified: Secondary | ICD-10-CM | POA: Diagnosis not present

## 2012-01-02 DIAGNOSIS — M810 Age-related osteoporosis without current pathological fracture: Secondary | ICD-10-CM | POA: Diagnosis not present

## 2012-01-02 DIAGNOSIS — M2550 Pain in unspecified joint: Secondary | ICD-10-CM | POA: Diagnosis not present

## 2012-01-09 DIAGNOSIS — L905 Scar conditions and fibrosis of skin: Secondary | ICD-10-CM | POA: Diagnosis not present

## 2012-01-29 ENCOUNTER — Encounter: Payer: Medicare Other | Attending: Neurosurgery | Admitting: Physical Medicine & Rehabilitation

## 2012-01-29 ENCOUNTER — Encounter: Payer: Self-pay | Admitting: Physical Medicine & Rehabilitation

## 2012-01-29 VITALS — BP 118/56 | HR 72 | Resp 18 | Ht 63.0 in | Wt 159.4 lb

## 2012-01-29 DIAGNOSIS — M4316 Spondylolisthesis, lumbar region: Secondary | ICD-10-CM

## 2012-01-29 DIAGNOSIS — M4712 Other spondylosis with myelopathy, cervical region: Secondary | ICD-10-CM | POA: Diagnosis not present

## 2012-01-29 DIAGNOSIS — Q762 Congenital spondylolisthesis: Secondary | ICD-10-CM | POA: Insufficient documentation

## 2012-01-29 DIAGNOSIS — M4310 Spondylolisthesis, site unspecified: Secondary | ICD-10-CM

## 2012-01-29 DIAGNOSIS — M069 Rheumatoid arthritis, unspecified: Secondary | ICD-10-CM | POA: Diagnosis not present

## 2012-01-29 MED ORDER — OXYCODONE-ACETAMINOPHEN 7.5-500 MG PO TABS: 1.0000 | ORAL_TABLET | ORAL | Status: DC | PRN

## 2012-01-29 NOTE — Progress Notes (Signed)
Subjective:    Patient ID: Angela [name], female    DOB: February 24, 1936, 76 y.o.   MRN: [id number]  HPI Mrs. Woolbright is back regarding her chronic pain. She had hope to wean down on the narcotics this winter but the cold weather prohibited that.  She finds that the lidoderm patches and voltaren gel are effective for local relief.  She saw Dr. Newell Coral a few weeks ago and she has further DDD in the lower cervical spine segments. Conservative care was recommended.  She remains on vesicare for her bladder to help incontinence.  Bowels are regular.  She drinks prune juice nightly.  Pain Inventory Average Pain 5 Pain Right Now 7 My pain is intermittent, dull and aching  In the last 24 hours, has pain interfered with the following? General activity 6 Relation with others 7 Enjoyment of life 8 What TIME of day is your pain at its worst? morning Sleep (in general) Good  Pain is worse with: some activites Pain improves with: medication Relief from Meds: 7  Mobility walk with assistance how many minutes can you walk? 2-3 ability to climb steps?  no do you drive?  no use a wheelchair needs help with transfers  Function retired I need assistance with the following:  feeding, dressing, bathing, toileting, meal prep, household duties and shopping  Neuro/Psych bladder control problems numbness trouble walking depression  Prior Studies Any changes since last visit?  no  Physicians involved in your care Any changes since last visit?  no     Review of Systems  Constitutional: Positive for unexpected weight change.       Weight gain and high blood sugar @ times  HENT: Positive for neck pain.   Musculoskeletal: Positive for back pain and gait problem.  Neurological: Positive for numbness.  Psychiatric/Behavioral: Positive for dysphoric mood.  All other systems reviewed and are negative.       Objective:   Physical Exam  Constitutional: She is oriented to person, place,  and time. She appears well-developed.  HENT:  Head: Normocephalic and atraumatic.  Eyes: Pupils are equal, round, and reactive to light.  Neck: Normal range of motion.  Cardiovascular: Normal rate and regular rhythm.   Pulmonary/Chest: Effort normal.  Musculoskeletal: Normal range of motion.       Patient has limited ROM of the cervical spine in all planes.  She has some pain with palpation over the mid to lower cervical spine segments.   Multiple RA deformities are noted over the hands and extremities.  Neurological: She is alert and oriented to person, place, and time. No cranial nerve deficit or sensory deficit.       No or sensory changes are seen. She has generalized weakness partially related to rheumatoid deformities. Strength is grossly 4/5 in the upper and lower extremities today.  Cognitively she is alert and appropriate.  Skin: Skin is warm.  Psychiatric: She has a normal mood and affect. Her behavior is normal. Judgment and thought content normal.          Assessment & Plan:  ASSESSMENT:  1. History of cervical stenosis/spondylosis with myelopathy.  2. Rheumatoid arthritis.  3. Lumbar spondylolisthesis.   PLAN:  1. She was given one prescription for Percocet 7.5/325 1 p.o. q.6  hours p.r.n., 120 with no refill. Her questions were encouraged and  Answered. Second script was given for next month.  2. Continue with lidoderm and voltaren gel. 3. All in all Mrs. Hindes looks quite good.  I'll  see her back in about 2 months

## 2012-01-29 NOTE — Patient Instructions (Signed)
Continue range of motion and activity to tolerance

## 2012-01-30 ENCOUNTER — Encounter: Payer: Self-pay | Admitting: Physical Medicine & Rehabilitation

## 2012-02-18 ENCOUNTER — Other Ambulatory Visit: Payer: Self-pay | Admitting: Physical Medicine & Rehabilitation

## 2012-03-05 DIAGNOSIS — E785 Hyperlipidemia, unspecified: Secondary | ICD-10-CM | POA: Diagnosis not present

## 2012-03-05 DIAGNOSIS — F329 Major depressive disorder, single episode, unspecified: Secondary | ICD-10-CM | POA: Diagnosis not present

## 2012-03-05 DIAGNOSIS — I10 Essential (primary) hypertension: Secondary | ICD-10-CM | POA: Diagnosis not present

## 2012-03-05 DIAGNOSIS — N318 Other neuromuscular dysfunction of bladder: Secondary | ICD-10-CM | POA: Diagnosis not present

## 2012-03-05 DIAGNOSIS — K219 Gastro-esophageal reflux disease without esophagitis: Secondary | ICD-10-CM | POA: Diagnosis not present

## 2012-03-07 DIAGNOSIS — Z961 Presence of intraocular lens: Secondary | ICD-10-CM | POA: Diagnosis not present

## 2012-03-17 DIAGNOSIS — E785 Hyperlipidemia, unspecified: Secondary | ICD-10-CM | POA: Diagnosis not present

## 2012-03-17 DIAGNOSIS — I359 Nonrheumatic aortic valve disorder, unspecified: Secondary | ICD-10-CM | POA: Diagnosis not present

## 2012-03-17 DIAGNOSIS — I10 Essential (primary) hypertension: Secondary | ICD-10-CM | POA: Diagnosis not present

## 2012-03-17 DIAGNOSIS — E669 Obesity, unspecified: Secondary | ICD-10-CM | POA: Diagnosis not present

## 2012-03-17 DIAGNOSIS — I2510 Atherosclerotic heart disease of native coronary artery without angina pectoris: Secondary | ICD-10-CM | POA: Diagnosis not present

## 2012-03-21 DIAGNOSIS — I359 Nonrheumatic aortic valve disorder, unspecified: Secondary | ICD-10-CM | POA: Diagnosis not present

## 2012-03-21 DIAGNOSIS — E669 Obesity, unspecified: Secondary | ICD-10-CM | POA: Diagnosis not present

## 2012-03-21 DIAGNOSIS — E785 Hyperlipidemia, unspecified: Secondary | ICD-10-CM | POA: Diagnosis not present

## 2012-03-21 DIAGNOSIS — I10 Essential (primary) hypertension: Secondary | ICD-10-CM | POA: Diagnosis not present

## 2012-03-21 DIAGNOSIS — I2510 Atherosclerotic heart disease of native coronary artery without angina pectoris: Secondary | ICD-10-CM | POA: Diagnosis not present

## 2012-03-28 ENCOUNTER — Encounter: Payer: Medicare Other | Attending: Neurosurgery | Admitting: Physical Medicine & Rehabilitation

## 2012-03-28 ENCOUNTER — Encounter: Payer: Self-pay | Admitting: Physical Medicine & Rehabilitation

## 2012-03-28 VITALS — BP 127/74 | HR 79 | Resp 18 | Ht 64.0 in | Wt 159.0 lb

## 2012-03-28 DIAGNOSIS — M4316 Spondylolisthesis, lumbar region: Secondary | ICD-10-CM

## 2012-03-28 DIAGNOSIS — M4310 Spondylolisthesis, site unspecified: Secondary | ICD-10-CM | POA: Insufficient documentation

## 2012-03-28 DIAGNOSIS — M4712 Other spondylosis with myelopathy, cervical region: Secondary | ICD-10-CM | POA: Insufficient documentation

## 2012-03-28 DIAGNOSIS — M069 Rheumatoid arthritis, unspecified: Secondary | ICD-10-CM | POA: Diagnosis not present

## 2012-03-28 MED ORDER — OXYCODONE-ACETAMINOPHEN 7.5-500 MG PO TABS: 1.0000 | ORAL_TABLET | ORAL | Status: DC | PRN

## 2012-03-28 MED ORDER — LIDOCAINE 5 % EX PTCH: 1.0000 | MEDICATED_PATCH | Freq: Every day | CUTANEOUS | Status: DC

## 2012-03-28 NOTE — Progress Notes (Signed)
Subjective:    Patient ID: Angela [name], female    DOB: 1935-11-07, 76 y.o.   MRN: [id number] Recent normal stress test, echo showed plaque. This was done because of chest pain. No imminent follow up is recommended.  Otherwise she's been feeling pretty well.  Pain is under control with her current medications. Her bowels are regular. The vesicare was increased to help with her urinary incontinence.    Pain Inventory Average Pain 5 Pain Right Now 4 My pain is constant and dull  In the last 24 hours, has pain interfered with the following? General activity 5 Relation with others 5 Enjoyment of life 6 What TIME of day is your pain at its worst? in the morning and at night Sleep (in general) Good  Pain is worse with: standing Pain improves with: heat/ice, therapy/exercise and medication Relief from Meds: 7  Mobility walk with assistance use a walker how many minutes can you walk? 3 ability to climb steps?  no do you drive?  no use a wheelchair needs help with transfers Do you have any goals in this area?  yes  Function retired I need assistance with the following:  dressing, bathing, toileting, meal prep, household duties and shopping  Neuro/Psych bladder control problems trouble walking  Prior Studies Any changes since last visit?  yes Stress test and echocardiogram  Physicians involved in your care Any changes since last visit?  no   Family History  Problem Relation Age of Onset  . Cancer Mother     lung cancer  . Cancer Brother     melanoma   History   Social History  . Marital Status: Married    Spouse Name: N/A    Number of Children: N/A  . Years of Education: N/A   Social History Main Topics  . Smoking status: Never Smoker   . Smokeless tobacco: Never Used  . Alcohol Use: None  . Drug Use: None  . Sexually Active: None   Other Topics Concern  . None   Social History Narrative  . None   Past Surgical History  Procedure Date  .  Fracture surgery 09/2011    left ankle screw and pin removed   Past Medical History  Diagnosis Date  . Cervical stenosis of spine   . Rheumatoid arthritis   . Spondylolisthesis of lumbar region   . CAD (coronary artery disease)   . Hypertension    BP 127/74  Pulse 79  Resp 18  Ht 5\' 4"  (1.626 m)  Wt 159 lb (72.122 kg)  BMI 27.29 kg/m2  SpO2 98%   HPI    Review of Systems  Constitutional: Negative.   Eyes: Negative.   Respiratory: Negative.   Cardiovascular: Positive for chest pain.  Gastrointestinal: Negative.   Genitourinary: Positive for urgency.  Musculoskeletal: Positive for arthralgias.  Skin: Negative.   Neurological: Positive for headaches.  Hematological: Bruises/bleeds easily (r/t long term prednisone use).  Psychiatric/Behavioral: Negative.        Objective:   Physical Exam  Musculoskeletal:       Continued limited ROM in the neck, shoulder, and hands. She remains in her wheelchair and requies assist for transfers  Neurological: She has normal reflexes. No cranial nerve deficit or sensory deficit.       Ongoing pain inhibition and generalized weakness of 3-4/5  Psychiatric: She has a normal mood and affect. Her behavior is normal. Judgment and thought content normal.  Assessment & Plan:  ASSESSMENT:  1. History of cervical stenosis/spondylosis with myelopathy.  2. Rheumatoid arthritis.  3. Lumbar spondylolisthesis.    PLAN:  1. She was given one prescription for Percocet 7.5/325 1 p.o. q.6  hours p.r.n., 120. Her questions were encouraged and  Answered. Second script was given for next month.  2. Continue with lidoderm and voltaren gel. lidoderm was reordered.  3. Angela [name] looks great. I'll have her see my PA back in 2 months.

## 2012-04-02 DIAGNOSIS — M2550 Pain in unspecified joint: Secondary | ICD-10-CM | POA: Diagnosis not present

## 2012-04-02 DIAGNOSIS — M069 Rheumatoid arthritis, unspecified: Secondary | ICD-10-CM | POA: Diagnosis not present

## 2012-04-02 DIAGNOSIS — M810 Age-related osteoporosis without current pathological fracture: Secondary | ICD-10-CM | POA: Diagnosis not present

## 2012-04-02 DIAGNOSIS — Z79899 Other long term (current) drug therapy: Secondary | ICD-10-CM | POA: Diagnosis not present

## 2012-04-03 DIAGNOSIS — M069 Rheumatoid arthritis, unspecified: Secondary | ICD-10-CM | POA: Diagnosis not present

## 2012-05-20 DIAGNOSIS — M79609 Pain in unspecified limb: Secondary | ICD-10-CM | POA: Diagnosis not present

## 2012-06-04 ENCOUNTER — Encounter: Payer: Self-pay | Admitting: Physical Medicine and Rehabilitation

## 2012-06-04 ENCOUNTER — Other Ambulatory Visit: Payer: Self-pay | Admitting: Physical Medicine & Rehabilitation

## 2012-06-04 ENCOUNTER — Encounter
Payer: Medicare Other | Attending: Physical Medicine and Rehabilitation | Admitting: Physical Medicine and Rehabilitation

## 2012-06-04 VITALS — BP 124/59 | HR 63 | Resp 16 | Ht 64.0 in | Wt 160.0 lb

## 2012-06-04 DIAGNOSIS — Q762 Congenital spondylolisthesis: Secondary | ICD-10-CM | POA: Insufficient documentation

## 2012-06-04 DIAGNOSIS — Z79899 Other long term (current) drug therapy: Secondary | ICD-10-CM | POA: Diagnosis not present

## 2012-06-04 DIAGNOSIS — M4802 Spinal stenosis, cervical region: Secondary | ICD-10-CM | POA: Insufficient documentation

## 2012-06-04 DIAGNOSIS — M4316 Spondylolisthesis, lumbar region: Secondary | ICD-10-CM

## 2012-06-04 DIAGNOSIS — Z5181 Encounter for therapeutic drug level monitoring: Secondary | ICD-10-CM | POA: Diagnosis not present

## 2012-06-04 DIAGNOSIS — M4310 Spondylolisthesis, site unspecified: Secondary | ICD-10-CM | POA: Diagnosis not present

## 2012-06-04 DIAGNOSIS — M069 Rheumatoid arthritis, unspecified: Secondary | ICD-10-CM | POA: Diagnosis not present

## 2012-06-04 DIAGNOSIS — M4712 Other spondylosis with myelopathy, cervical region: Secondary | ICD-10-CM | POA: Insufficient documentation

## 2012-06-04 DIAGNOSIS — M961 Postlaminectomy syndrome, not elsewhere classified: Secondary | ICD-10-CM | POA: Diagnosis not present

## 2012-06-04 MED ORDER — OXYCODONE-ACETAMINOPHEN 7.5-500 MG PO TABS: 1.0000 | ORAL_TABLET | ORAL | Status: DC | PRN

## 2012-06-04 NOTE — Patient Instructions (Addendum)
Continue with your walking, continue with riding the stationary bike. Try to find out whether your insurance pays for Flector patches.

## 2012-06-04 NOTE — Progress Notes (Signed)
Subjective:    Patient ID: Angela [name], female    DOB: [birth year], 76 y.o.   MRN: [id number]  HPI The patient complains about chronic neck pain which radiates into left arm in a C6 distribution. The patient also complains about numbness and tingling in the same distribution. The problem has gotten worse. She is considering to follow up with her neurosurgeon Dr. Nila Nephew. Pain Inventory Average Pain 8 Pain Right Now 5 My pain is intermittent and tingling  In the last 24 hours, has pain interfered with the following? General activity 7 Relation with others 7 Enjoyment of life 8 What TIME of day is your pain at its worst? morning and night Sleep (in general) Good  Pain is worse with: walking and standing Pain improves with: rest, heat/ice, therapy/exercise, pacing activities and medication Relief from Meds: 6  Mobility walk with assistance use a walker how many minutes can you walk? 3 ability to climb steps?  yes do you drive?  no  Function retired I need assistance with the following:  dressing, bathing, toileting, meal prep, household duties and shopping  Neuro/Psych bladder control problems numbness tingling  Prior Studies Any changes since last visit?  no  Physicians involved in your care Any changes since last visit?  no   Family History  Problem Relation Age of Onset  . Cancer Mother     lung cancer  . Cancer Brother     melanoma   History   Social History  . Marital Status: Married    Spouse Name: N/A    Number of Children: N/A  . Years of Education: N/A   Social History Main Topics  . Smoking status: Never Smoker   . Smokeless tobacco: Never Used  . Alcohol Use: None  . Drug Use: None  . Sexually Active: None   Other Topics Concern  . None   Social History Narrative  . None   Past Surgical History  Procedure Date  . Fracture surgery 09/2011    left ankle screw and pin removed   Past Medical History  Diagnosis Date  . Cervical  stenosis of spine   . Rheumatoid arthritis   . Spondylolisthesis of lumbar region   . CAD (coronary artery disease)   . Hypertension    BP 124/59  Pulse 63  Resp 16  Ht 5\' 4"  (1.626 m)  Wt 160 lb (72.576 kg)  BMI 27.46 kg/m2  SpO2 92%    Review of Systems  Cardiovascular: Positive for leg swelling.  Musculoskeletal: Positive for back pain.  Neurological: Positive for numbness.       Tingling  Hematological: Bruises/bleeds easily.  All other systems reviewed and are negative.       Objective:   Physical Exam  Constitutional: She is oriented to person, place, and time. She appears well-developed and well-nourished.  HENT:  Head: Normocephalic.  Musculoskeletal: She exhibits tenderness.  Neurological: She is alert and oriented to person, place, and time.  Skin: Skin is warm and dry.  Psychiatric: She has a normal mood and affect.    Symmetric normal motor tone is noted throughout. Decreased muscle bulk. Muscle testing reveals 4/5 muscle strength of the upper extremity, and 5/5 of the lower extremity. Range of motion in upper and lower extremities is restricted due to her RA, fusion of ankles bilateral, fracture ankylosis of right elbow, deformity of wrist and fingers. ROM of spine is restricted. Fine motor movements are severely restricted in both hands. DTR in the upper  and lower extremity are present and symmetric 2+. No clonus is noted.  Patient is in a wheel chair.         Assessment & Plan:  1. History of cervical stenosis/spondylosis with myelopathy, s/p PSF C-spine.  2. Rheumatoid arthritis.  3. Lumbar spondylolisthesis.  PLAN:  1. She was given one prescription for Percocet 7.5/325 1 p.o. q.6  hours p.r.n., 120. Her questions were encouraged and  Answered. Second script was given for next month.  2. Continue with lidoderm and voltaren gel. lidoderm patches.  3. Mrs. Uyeno looks great.  PA back in 2 months.

## 2012-06-10 DIAGNOSIS — M810 Age-related osteoporosis without current pathological fracture: Secondary | ICD-10-CM | POA: Diagnosis not present

## 2012-06-10 DIAGNOSIS — M5412 Radiculopathy, cervical region: Secondary | ICD-10-CM | POA: Diagnosis not present

## 2012-06-10 DIAGNOSIS — Z79899 Other long term (current) drug therapy: Secondary | ICD-10-CM | POA: Diagnosis not present

## 2012-06-10 DIAGNOSIS — M069 Rheumatoid arthritis, unspecified: Secondary | ICD-10-CM | POA: Diagnosis not present

## 2012-06-10 DIAGNOSIS — M2550 Pain in unspecified joint: Secondary | ICD-10-CM | POA: Diagnosis not present

## 2012-07-03 DIAGNOSIS — M19019 Primary osteoarthritis, unspecified shoulder: Secondary | ICD-10-CM | POA: Diagnosis not present

## 2012-07-16 DIAGNOSIS — D235 Other benign neoplasm of skin of trunk: Secondary | ICD-10-CM | POA: Diagnosis not present

## 2012-07-16 DIAGNOSIS — L820 Inflamed seborrheic keratosis: Secondary | ICD-10-CM | POA: Diagnosis not present

## 2012-07-16 DIAGNOSIS — L570 Actinic keratosis: Secondary | ICD-10-CM | POA: Diagnosis not present

## 2012-07-25 DIAGNOSIS — Z1289 Encounter for screening for malignant neoplasm of other sites: Secondary | ICD-10-CM | POA: Diagnosis not present

## 2012-07-25 DIAGNOSIS — Z1231 Encounter for screening mammogram for malignant neoplasm of breast: Secondary | ICD-10-CM | POA: Diagnosis not present

## 2012-07-25 DIAGNOSIS — M542 Cervicalgia: Secondary | ICD-10-CM | POA: Diagnosis not present

## 2012-08-01 ENCOUNTER — Encounter: Payer: Self-pay | Admitting: Physical Medicine and Rehabilitation

## 2012-08-01 ENCOUNTER — Encounter
Payer: Medicare Other | Attending: Physical Medicine and Rehabilitation | Admitting: Physical Medicine and Rehabilitation

## 2012-08-01 VITALS — BP 127/71 | HR 92 | Resp 14 | Ht 64.0 in | Wt 160.0 lb

## 2012-08-01 DIAGNOSIS — G8929 Other chronic pain: Secondary | ICD-10-CM | POA: Diagnosis not present

## 2012-08-01 DIAGNOSIS — G959 Disease of spinal cord, unspecified: Secondary | ICD-10-CM | POA: Diagnosis not present

## 2012-08-01 DIAGNOSIS — M4712 Other spondylosis with myelopathy, cervical region: Secondary | ICD-10-CM

## 2012-08-01 DIAGNOSIS — M069 Rheumatoid arthritis, unspecified: Secondary | ICD-10-CM | POA: Diagnosis not present

## 2012-08-01 DIAGNOSIS — M4802 Spinal stenosis, cervical region: Secondary | ICD-10-CM | POA: Insufficient documentation

## 2012-08-01 DIAGNOSIS — M4310 Spondylolisthesis, site unspecified: Secondary | ICD-10-CM | POA: Diagnosis not present

## 2012-08-01 DIAGNOSIS — M47812 Spondylosis without myelopathy or radiculopathy, cervical region: Secondary | ICD-10-CM | POA: Diagnosis not present

## 2012-08-01 DIAGNOSIS — M4316 Spondylolisthesis, lumbar region: Secondary | ICD-10-CM

## 2012-08-01 DIAGNOSIS — M47817 Spondylosis without myelopathy or radiculopathy, lumbosacral region: Secondary | ICD-10-CM | POA: Insufficient documentation

## 2012-08-01 MED ORDER — OXYCODONE-ACETAMINOPHEN 7.5-500 MG PO TABS: 1.0000 | ORAL_TABLET | ORAL | Status: DC | PRN

## 2012-08-01 NOTE — Patient Instructions (Signed)
Follow up with Dr. Nudelman. 

## 2012-08-01 NOTE — Progress Notes (Signed)
Subjective:    Patient ID: Angela [name], female    DOB: [birth year], 76 y.o.   MRN: [id number]  HPI The patient complains about chronic neck pain which radiates into left arm in a C6 distribution. The patient also complains about numbness and tingling in the same distribution.  The problem has gotten worse. She has followed up with her neurosurgeon Dr. Nila Nephew, he took x-rays of her C-spine, which showed collapsed disc at C5-C7, he ordered a CT-scan and a MRI. She will follow up with him again on Oct. 2nd  Pain Inventory Average Pain 5 Pain Right Now 6 My pain is burning, stabbing and aching  In the last 24 hours, has pain interfered with the following? General activity 8 Relation with others 6 Enjoyment of life 7 What TIME of day is your pain at its worst? evening Sleep (in general) Good  Pain is worse with: standing Pain improves with: heat/ice, medication and injections Relief from Meds: 7  Mobility walk with assistance use a walker do you drive?  no use a wheelchair needs help with transfers  Function retired I need assistance with the following:  bathing, toileting, meal prep, household duties and shopping  Neuro/Psych numbness spasms anxiety  Prior Studies Any changes since last visit?  no  Physicians involved in your care Any changes since last visit?  no   Family History  Problem Relation Age of Onset  . Cancer Mother     lung cancer  . Cancer Brother     melanoma   History   Social History  . Marital Status: Married    Spouse Name: N/A    Number of Children: N/A  . Years of Education: N/A   Social History Main Topics  . Smoking status: Never Smoker   . Smokeless tobacco: Never Used  . Alcohol Use: None  . Drug Use: None  . Sexually Active: None   Other Topics Concern  . None   Social History Narrative  . None   Past Surgical History  Procedure Date  . Fracture surgery 09/2011    left ankle screw and pin removed   Past  Medical History  Diagnosis Date  . Cervical stenosis of spine   . Rheumatoid arthritis   . Spondylolisthesis of lumbar region   . CAD (coronary artery disease)   . Hypertension    BP 127/71  Pulse 92  Resp 14  Ht 5\' 4"  (1.626 m)  Wt 160 lb (72.576 kg)  BMI 27.46 kg/m2  SpO2 93%      Review of Systems  Constitutional: Negative.   HENT: Negative.   Eyes: Negative.   Respiratory: Negative.   Cardiovascular: Negative.   Gastrointestinal: Negative.   Genitourinary: Negative.   Musculoskeletal: Positive for myalgias and arthralgias.  Skin: Negative.   Neurological: Negative.   Hematological: Bruises/bleeds easily.  Psychiatric/Behavioral: Negative.        Objective:   Physical Exam Constitutional: She is oriented to person, place, and time. She appears well-developed and well-nourished.  HENT:  Head: Normocephalic.  Musculoskeletal: She exhibits tenderness.  Neurological: She is alert and oriented to person, place, and time.  Skin: Skin is warm and dry.  Psychiatric: She has a normal mood and affect.   Symmetric normal motor tone is noted throughout. Decreased muscle bulk. Muscle testing reveals 4/5 muscle strength of the upper extremity, and 5/5 of the lower extremity. Range of motion in upper and lower extremities is restricted due to her RA, fusion of ankles  bilateral, fracture ankylosis of right elbow, deformity of wrist and fingers. ROM of spine is restricted. Fine motor movements are severely restricted in both hands.  DTR in the upper and lower extremity are present and symmetric 2+. No clonus is noted.  Patient is in a wheel chair.         Assessment & Plan:  1. History of cervical stenosis/spondylosis with myelopathy, s/p PSF C-spine. Is following up with Dr. Newell Coral, who ordered Ct-scan and MRI of C-spine. 2. Rheumatoid arthritis.  3. Lumbar spondylolisthesis.  PLAN:  1. She was given one prescription for Percocet 7.5/325 1 p.o. q.6  hours p.r.n.,  120.To be filled when due Her questions were encouraged and  Answered. 2. Continue with lidoderm and voltaren gel. lidoderm patches.  3.  PA back in 2 months.

## 2012-08-04 ENCOUNTER — Other Ambulatory Visit: Payer: Self-pay | Admitting: Neurosurgery

## 2012-08-05 ENCOUNTER — Other Ambulatory Visit: Payer: Self-pay | Admitting: Neurosurgery

## 2012-08-05 DIAGNOSIS — M542 Cervicalgia: Secondary | ICD-10-CM

## 2012-08-06 ENCOUNTER — Other Ambulatory Visit: Payer: Self-pay | Admitting: Obstetrics and Gynecology

## 2012-08-06 ENCOUNTER — Ambulatory Visit
Admission: RE | Admit: 2012-08-06 | Discharge: 2012-08-06 | Disposition: A | Discharge status: Home / Self Care | Payer: Medicare Other | Source: Ambulatory Visit | Attending: Neurosurgery | Admitting: Neurosurgery

## 2012-08-06 DIAGNOSIS — M4802 Spinal stenosis, cervical region: Secondary | ICD-10-CM | POA: Diagnosis not present

## 2012-08-06 DIAGNOSIS — M542 Cervicalgia: Secondary | ICD-10-CM | POA: Diagnosis not present

## 2012-08-06 DIAGNOSIS — Z981 Arthrodesis status: Secondary | ICD-10-CM | POA: Diagnosis not present

## 2012-08-06 DIAGNOSIS — N63 Unspecified lump in unspecified breast: Secondary | ICD-10-CM

## 2012-08-08 ENCOUNTER — Ambulatory Visit
Admission: RE | Admit: 2012-08-08 | Discharge: 2012-08-08 | Disposition: A | Discharge status: Home / Self Care | Payer: Medicare Other | Source: Ambulatory Visit | Attending: Obstetrics and Gynecology | Admitting: Obstetrics and Gynecology

## 2012-08-08 DIAGNOSIS — N63 Unspecified lump in unspecified breast: Secondary | ICD-10-CM

## 2012-08-12 DIAGNOSIS — Z1211 Encounter for screening for malignant neoplasm of colon: Secondary | ICD-10-CM | POA: Diagnosis not present

## 2012-08-22 DIAGNOSIS — Z23 Encounter for immunization: Secondary | ICD-10-CM | POA: Diagnosis not present

## 2012-09-05 DIAGNOSIS — R300 Dysuria: Secondary | ICD-10-CM | POA: Diagnosis not present

## 2012-09-05 DIAGNOSIS — K219 Gastro-esophageal reflux disease without esophagitis: Secondary | ICD-10-CM | POA: Diagnosis not present

## 2012-09-05 DIAGNOSIS — R079 Chest pain, unspecified: Secondary | ICD-10-CM | POA: Diagnosis not present

## 2012-09-05 DIAGNOSIS — I10 Essential (primary) hypertension: Secondary | ICD-10-CM | POA: Diagnosis not present

## 2012-09-05 HISTORY — PX: CARDIAC CATHETERIZATION: SHX172

## 2012-09-10 DIAGNOSIS — M2550 Pain in unspecified joint: Secondary | ICD-10-CM | POA: Diagnosis not present

## 2012-09-10 DIAGNOSIS — Z79899 Other long term (current) drug therapy: Secondary | ICD-10-CM | POA: Diagnosis not present

## 2012-09-10 DIAGNOSIS — M069 Rheumatoid arthritis, unspecified: Secondary | ICD-10-CM | POA: Diagnosis not present

## 2012-09-16 DIAGNOSIS — E785 Hyperlipidemia, unspecified: Secondary | ICD-10-CM | POA: Diagnosis not present

## 2012-09-16 DIAGNOSIS — I10 Essential (primary) hypertension: Secondary | ICD-10-CM | POA: Diagnosis not present

## 2012-09-16 DIAGNOSIS — I4949 Other premature depolarization: Secondary | ICD-10-CM | POA: Diagnosis not present

## 2012-09-16 DIAGNOSIS — I2510 Atherosclerotic heart disease of native coronary artery without angina pectoris: Secondary | ICD-10-CM | POA: Diagnosis not present

## 2012-09-17 ENCOUNTER — Other Ambulatory Visit: Payer: Self-pay | Admitting: Cardiology

## 2012-09-18 ENCOUNTER — Other Ambulatory Visit: Payer: Self-pay | Admitting: Cardiology

## 2012-09-18 NOTE — H&P (Signed)
Office Visit     Patient: Angela [name] Provider: Ethell Blatchford, MD  DOB: 08/22/1936 Age: 75 Y Sex: Female Date: 09/16/2012  Phone: [phone]   Address: [address], [place], [id number]  Pcp: Angela [name]       Subjective:     CC:    1. 6MO FOLLOW UP.        HPI:  General:  The patient presents today for followup of her CAD/HTN/lipids. She denies any LE edema, dizziness, palpitations or syncope. She says that about 2 weeks ago she was at Dr. Griffin's office for an exam and had chest pain midsternal with radiation into her left arm and neck. Dr. Griffin ordered cardiac enzymes which were normal. She says that sometimes at night she notices that she will have chest pain over her left breast when she lays on her left side but she does not get it when laying on her right side. She occasionally has some DOE which is stable. The pain at night she says has significantly improved since she had the injection in her left shoulder. She says that a few times a month she gets the pain in her chest similar to what she had in Dr. Griffin's office..        ROS:  See HPI, A twelve system review was perfomed at today's visit. For pertinent positives and negatives see HPI.       Medical History: coronary disease status post PCI of with BMS to mid LAD--2/08, Alieu Finnigan not on ASA due to frequent ulers, Asthma, Depression, Dyslipidemia, GERD, Hypertension, Obesity, rheumatoid arthritis-erosive, s/p cervical spine fusion, Hawkes, PVCs, Shrimp allergy, PUD - cannot take ASA, Osteoporosis, alendronate since 2012, macular degeneration, Groat, Impaired fasting glucose, GI Johnson, Pain Schwartz, Derm [place], NS Nudelman, Ortho [facility], Mild aortic stenosis - echo 03/2012.        Surgical History: status post cholecystectomy , status post C1-2, Nudelman 2008, status post bilateral total knee replacements , status post bilateral ankle fusion , wedge excision right auricularlesion/Dr Jeff Rosen 07/08/08.         Family History: Father: deceased 42 yrs MVA Mother: deceased 88 yrs Old age Brother 1: deceased melanoma Brother2: alive 2 brother(s) . 2daughter(s) .        Social History:  General:  History of smoking cigarettes: Never smoked.  Alcohol: no.  Caffeine: yes.  no Recreational drug use.  Exercise: walking around house, .  Occupation: retired, computer operator.  Marital Status: Married.        Medications: Zocor 40 MG Tablet 1 tablet every evening Once a day, Oxycodone-Acetaminophen 7.5-325 MG Tablet 1 tablet 2 or 3 a day, Nasonex 50 MCG/ACT Suspension 2 pufs as neeeded if needed in winter, Calcium 500 + D 500-125 MG-UNIT Tablet 1 tablet three times a day, Folic Acid 1 MG Tablet 1 tablet Once a day, Nystatin 100000U Cream APPLY EXTERNALLY TO THE AFFECTED AREA TWICE A DAY AS NEEDED AS DIRECTED , Nitroglycerin 0.4 mg 0.4 mg tablet 1 tablet as directed as directed prn chest pain, Loratadine 10 MG Tablet 1 tablet Once a day, Plavix 75 MG Tablet 1 tablet Once a day, VESIcare 10 MG Tablet 1 tablet Once a day, PredniSONE 5 MG 5mg Tablet 1 tab Once a day, Methotrexate 2.5MG Tablet TAKE 6 TABLETS ONCE WEEKLY , Fioricet 50-325-40 MG Tablet 1 tablet as needed for tension headache every 4 hrs, Pantoprazole Sodium 40MG Tablet Delayed Release 1 tablet once a day, Lorazepam 2 MG Tablet 1   tablet once daily at bedtime, Fosamax unsure Tablet 1 tablet Once a month, Medication List reviewed and reconciled with the patient       Allergies: Codeine (for allergy): nausea, Seafood: swelling, Peanuts: tongue lips swell .       Objective:     Vitals: Wt w/c, Ht 62, Pulse sitting 60, BP sitting 120/78.       Examination:  Cardiology, General:  GENERAL APPEARANCE: pleasant, NAD.  HEENT: unremarkable.  CAROTID UPSTROKE: normal, no bruit.  JVD: flat.  HEART SOUNDS: regular, normal S1, S2, no S3 or S4.  MURMUR: absent.  LUNGS: no rales or wheezes.  ABDOMEN: soft, non tender, positive bowel sounds, no  masses felt.  EXTREMITIES: no leg edema.  PERIPHERAL PULSES: 2 plus bilateral.        Assessment:     Assessment:  1. Coronary atherosclerosis of native coronary artery - 414.01 (Primary)  2. Essential hypertension, benign - 401.1  3. PVCs (premature ventricular contractions) - 427.69  4. Dyslipidemia - 272.4    Plan:     1. Coronary atherosclerosis of native coronary artery Continue Plavix Tablet, 75 MG, 1 tablet, Orally, Once a day ; Refill Nitroglycerin 0.4 mg tablet, 0.4 mg, 1 tablet as directed, SL, as directed prn chest pain, 30 days, 25, Refills 5 .  LAB: Basic Metabolic Normal    GLUCOSE 119 70-99 - mg/dL H   BUN 12 6-26 - mg/dL    CREATININE 0.60 0.60-1.30 - mg/dl    eGFR (NON-AFRICAN AMERICAN) 98 >60 - calc    eGFR (AFRICAN AMERICAN) 118 >60 - calc    SODIUM 139 136-145 - mmol/L    POTASSIUM 4.4 3.5-5.5 - mmol/L    CHLORIDE 106 98-107 - mmol/L    C02 27 22-32 - mmol/L    ANION GAP 10.5 6.0-20.0 - mmol/L    CALCIUM 9.3 8.6-10.3 - mg/dL     Azyriah Nevins M 09/17/2012 09:12:40 AM > Corson,Danielle 09/17/2012 02:24:48 PM > FOR CATH    LAB: CBC with Diff Stable    WBC 7.2 4.0-11.0 - K/ul    RBC 4.32 4.20-5.40 - M/uL    HGB 14.4 12.0-16.0 - [name]/dL    HCT 43.3 37.0-47.0 - %    MCH 33.3 27.0-33.0 - pg H   MPV 7.8 7.5-10.7 - fL    MCV 100.1 81.0-99.0 - fL H   MCHC 33.2 32.0-36.0 - [name]/dL    RDW 13.9 11.5-15.5 - %    NRBC# 0.01 -    PLT 177 150-400 - K/uL    NEUT % 76.6 43.3-71.9 - % H   NRBC% 0.10 - %    LYMPH% 14.6 16.8-43.5 - % L   MONO % 6.1 4.6-12.4 - %    EOS % 1.9 0.0-7.8 - %    BASO % 0.8 0.0-1.0 - %    NEUT # 5.5 1.9-7.2 - K/uL    LYMPH# 1.00 1.10-2.70 - K/uL L   MONO # 0.4 0.3-0.8 - K/uL    EOS # 0.1 0.0-0.6 - K/uL    BASO # 0.1 0.0-0.1 - K/uL     Amer Alcindor M 09/17/2012 09:13:18 AM > Corson,Danielle 09/17/2012 02:25:14 PM > FOR CATH    LAB: PT (Prothrombin Time) (005199) Normal    Prothrombin Time 11.1 9.1-12.0 - SEC    INR 1.0 0.8-1.2 -      Lacresha Fusilier M 09/17/2012 09:12:23 AM > Corson,Danielle 09/17/2012 02:25:24 PM > FOR CATH    I am very concerned about these episodes of   chest pain that she is having that radiate into her jaw and arm. She had a normal nuclear stress test earlier in the year. I have recommended that we proceed with cardiac cath to further evaluate her stent that was place in 2008. , Risks and benefits of cardiac catheterization have been reviewed including risk of stroke, heart attack, death, bleeding, renal impariment and arterial damage. There was ample oppurtuny to answer questions. Alternatives were discussed. Patient understands and wishes to proceed.       2. Dyslipidemia Continue Zocor Tablet, 40 MG, 1 tablet every evening, Orally, Once a day .        Procedures:  Venipuncture:  Venipuncture: Smith,Michele 09/16/2012 05:03:12 PM > , performed in right arm.        Immunizations:        Labs:        Procedure Codes: 80048 ECL BMP, 85025 ECL CBC PLATELET DIFF, 36415 BLOOD COLLECTION ROUTINE VENIPUNCTURE       Preventive:         Follow Up: cath (Reason: CAD/lipids/HTN/PVC's)      Provider: Jakiera Ehler, MD  Patient: Angela [name] DOB: 02/27/1936 Date: 09/16/2012    

## 2012-09-23 ENCOUNTER — Encounter (HOSPITAL_BASED_OUTPATIENT_CLINIC_OR_DEPARTMENT_OTHER)
Admission: RE | Disposition: A | Discharge status: Home / Self Care | Payer: Self-pay | Source: Ambulatory Visit | Attending: Cardiology

## 2012-09-23 ENCOUNTER — Inpatient Hospital Stay (HOSPITAL_BASED_OUTPATIENT_CLINIC_OR_DEPARTMENT_OTHER)
Admission: RE | Admit: 2012-09-23 | Discharge: 2012-09-23 | Disposition: A | Discharge status: Home / Self Care | Payer: Medicare Other | Source: Ambulatory Visit | Attending: Cardiology | Admitting: Cardiology

## 2012-09-23 DIAGNOSIS — I10 Essential (primary) hypertension: Secondary | ICD-10-CM | POA: Diagnosis not present

## 2012-09-23 DIAGNOSIS — E785 Hyperlipidemia, unspecified: Secondary | ICD-10-CM | POA: Insufficient documentation

## 2012-09-23 DIAGNOSIS — R079 Chest pain, unspecified: Secondary | ICD-10-CM | POA: Diagnosis not present

## 2012-09-23 DIAGNOSIS — R0789 Other chest pain: Secondary | ICD-10-CM | POA: Diagnosis not present

## 2012-09-23 DIAGNOSIS — Z9861 Coronary angioplasty status: Secondary | ICD-10-CM | POA: Diagnosis not present

## 2012-09-23 DIAGNOSIS — I25810 Atherosclerosis of coronary artery bypass graft(s) without angina pectoris: Secondary | ICD-10-CM

## 2012-09-23 DIAGNOSIS — I2510 Atherosclerotic heart disease of native coronary artery without angina pectoris: Secondary | ICD-10-CM | POA: Insufficient documentation

## 2012-09-23 SURGERY — JV LEFT HEART CATHETERIZATION WITH CORONARY ANGIOGRAM
Anesthesia: Moderate Sedation

## 2012-09-23 MED ORDER — DIAZEPAM 5 MG PO TABS
5.0000 mg | ORAL_TABLET | ORAL | Status: AC
  Administered 2012-09-23: 5 mg via ORAL

## 2012-09-23 MED ORDER — SODIUM CHLORIDE 0.9 % IV SOLN: 1.0000 mL/kg/h | INTRAVENOUS | Status: DC

## 2012-09-23 MED ORDER — ACETAMINOPHEN 325 MG PO TABS: 650.0000 mg | ORAL_TABLET | ORAL | Status: DC | PRN

## 2012-09-23 MED ORDER — SODIUM CHLORIDE 0.9 % IJ SOLN: 3.0000 mL | INTRAMUSCULAR | Status: DC | PRN

## 2012-09-23 MED ORDER — PREDNISONE 10 MG PO TABS: 60.0000 mg | ORAL_TABLET | ORAL | Status: DC

## 2012-09-23 MED ORDER — ASPIRIN 81 MG PO CHEW: 324.0000 mg | CHEWABLE_TABLET | ORAL | Status: DC

## 2012-09-23 MED ORDER — SODIUM CHLORIDE 0.9 % IJ SOLN: 3.0000 mL | Freq: Two times a day (BID) | INTRAMUSCULAR | Status: DC

## 2012-09-23 MED ORDER — SODIUM CHLORIDE 0.9 % IV SOLN: INTRAVENOUS | Status: DC

## 2012-09-23 MED ORDER — DIPHENHYDRAMINE HCL 50 MG/ML IJ SOLN
25.0000 mg | INTRAMUSCULAR | Status: AC
  Administered 2012-09-23: 25 mg via INTRAVENOUS

## 2012-09-23 MED ORDER — ONDANSETRON HCL 4 MG/2ML IJ SOLN: 4.0000 mg | Freq: Four times a day (QID) | INTRAMUSCULAR | Status: DC | PRN

## 2012-09-23 MED ORDER — FAMOTIDINE IN NACL 20-0.9 MG/50ML-% IV SOLN
20.0000 mg | INTRAVENOUS | Status: AC
  Administered 2012-09-23: 20 mg via INTRAVENOUS

## 2012-09-23 MED ORDER — SODIUM CHLORIDE 0.9 % IV SOLN: 250.0000 mL | INTRAVENOUS | Status: DC | PRN

## 2012-09-23 MED ORDER — PREDNISONE 50 MG PO TABS: 60.0000 mg | ORAL_TABLET | ORAL | Status: DC

## 2012-09-23 NOTE — Interval H&P Note (Signed)
History and Physical Interval Note:  09/23/2012 9:48 AM  Angela [name]  has presented today for surgery, with the diagnosis of chest apin  The various methods of treatment have been discussed with the patient and family. After consideration of risks, benefits and other options for treatment, the patient has consented to  Procedure(s) (LRB) with comments: JV LEFT HEART CATHETERIZATION WITH CORONARY ANGIOGRAM (N/A) as a surgical intervention .  The patient's history has been reviewed, patient examined, no change in status, stable for surgery.  I have reviewed the patient's chart and labs.  Questions were answered to the patient's satisfaction.     TURNER,TRACI R

## 2012-09-23 NOTE — Progress Notes (Signed)
Bedrest begins @ 1100.  Tegaderm dressing applied to right groin by Oliver Hum, site level 0.

## 2012-09-23 NOTE — CV Procedure (Addendum)
PROCEDURE:  Left heart catheterization with selective coronary angiography, left ventriculogram.  INDICATIONS:  Chest pain with history of LAD stent  The risks, benefits, and details of the procedure were explained to the patient.  The patient verbalized understanding and wanted to proceed.  Informed written consent was obtained.  PROCEDURE TECHNIQUE:  After Xylocaine anesthesia a 57F sheath was placed in the right femoral artery with a single anterior needle wall stick.   Left coronary angiography was done using a Judkins L4 guide catheter.  Right coronary angiography was done using a Judkins R4 guide catheter.  Left ventriculography was done using a pigtail catheter.    CONTRAST:  Total of 75cc.  COMPLICATIONS:  None.    HEMODYNAMICS:  Aortic pressure was 149/34mmHg ; LV pressure was 149/66mmHg; LVEDP .  There was no gradient between the left ventricle and aorta.    ANGIOGRAPHIC DATA:   The left main coronary artery is widely patent and bifurcates into an LAD and left circumflex arteries.  The left anterior descending artery is widely patent.  There is a stent in the proximal LAD which is patent.  The LAD gives rise to a first diagonal which bifurcates into 2 daughter branches and is patent.  The LAD then give rise to a second diagonal which bifurcates into 2 daughter vessels which are patent.  The ongoing LAD is widely patent.  The left circumflex artery is widely patent throughout its course in the AV groove.  It gives rise to a large first OM which is patent.  It then gives rise to a large second diagonal which bifurcates into 2 daughter vessels which are patent.  The ongoing left circumflex is patent.    The right coronary artery is widely patent.  It gives rise to 2 small acute RV marginal vessels which are patent.  The ongoing RCA is widely patent and bifurcates into a PDA and PL branches which are patent.  LEFT VENTRICULOGRAM:  Left ventricular angiogram was done in the 30 RAO  projection and revealed normal left ventricular wall motion and systolic function with an estimated ejection fraction of 50-55% with inferobasal akinesis.  LVEDP was mmHg.  IMPRESSIONS:  1. Normal left main coronary artery. 2. Normal left anterior descending artery and its branches and patent proximal LAD stent 3. Normal left circumflex artery and its branches. 4. Normal right coronary artery. 5. Normal left ventricular systolic function.  LVEDP 17 mmHg.  Ejection fraction 50-55% with inferobasal akinesis. 6.  Noncardiac chest pain  RECOMMENDATION:   1.  D/C home when IVF and bedrest complete 2.  Continue current medical therapy 3.  Followup with my NP in 2 weeks 4.  Further workup of noncardiac chest pain by primary MD

## 2012-09-23 NOTE — H&P (View-Only) (Signed)
Office Visit     Patient: Angela [name], Angela [name] Provider: Armanda Magic, MD  DOB: 1935-12-21 Age: 76 Y Sex: Female Date: 09/16/2012  Phone: [phone]   Address: [address], [place], [phone]  Pcp: Kirby Funk       Subjective:     CC:    1. FOLLOW UP.        HPI:  General:  The patient presents today for followup of her CAD/HTN/lipids. She denies any LE edema, dizziness, palpitations or syncope. She says that about 2 weeks ago she was at Dr. Jone Baseman office for an exam and had chest pain midsternal with radiation into her left arm and neck. Dr. Valentina Lucks ordered cardiac enzymes which were normal. She says that sometimes at night she notices that she will have chest pain over her left breast when she lays on her left side but she does not get it when laying on her right side. She occasionally has some DOE which is stable. The pain at night she says has significantly improved since she had the injection in her left shoulder. She says that a few times a month she gets the pain in her chest similar to what she had in Dr. Jone Baseman office..        ROS:  See HPI, A twelve system review was perfomed at today's visit. For pertinent positives and negatives see HPI.       Medical History: coronary disease status post PCI of with BMS to mid LAD--2/08, Angela [name] not on ASA due to frequent ulers, Asthma, Depression, Dyslipidemia, GERD, Hypertension, Obesity, rheumatoid arthritis-erosive, s/p cervical spine fusion, Hawkes, PVCs, Shrimp allergy, PUD - cannot take ASA, Osteoporosis, alendronate since 2012, macular degeneration, Groat, Impaired fasting glucose, GI Johnson, Pain Helen Hashimoto [place], NS [place], Ortho [facility], Mild aortic stenosis - echo 03/2012.        Surgical History: status post cholecystectomy , status post C1-2, Nudelman 2008, status post bilateral total knee replacements , status post bilateral ankle fusion , wedge excision right auricularlesion/Dr Allegra Grana 07/08/08.         Family History: Father: deceased 54 yrs MVA Mother: deceased 2 yrs Old age Brother 1: deceased melanoma Brother2: alive 2 brother(s) . 2daughter(s) .        Social History:  General:  History of smoking cigarettes: Never smoked.  Alcohol: no.  Caffeine: yes.  no Recreational drug use.  Exercise: walking around house, .  Occupation: retired, Retail buyer.  Marital Status: Married.        Medications: Zocor 40 MG Tablet 1 tablet every evening Once a day, Oxycodone-Acetaminophen 7.5-325 MG Tablet 1 tablet 2 or 3 a day, Nasonex 50 MCG/ACT Suspension 2 pufs as neeeded if needed in winter, Calcium 500 + D 500-125 MG-UNIT Tablet 1 tablet three times a day, Folic Acid 1 MG Tablet 1 tablet Once a day, Nystatin 100000 U Cream APPLY EXTERNALLY TO THE AFFECTED AREA TWICE A DAY AS NEEDED AS DIRECTED , Nitroglycerin 0.4 mg 0.4 mg tablet 1 tablet as directed as directed prn chest pain, Loratadine 10 MG Tablet 1 tablet Once a day, Plavix 75 MG Tablet 1 tablet Once a day, VESIcare 10 MG Tablet 1 tablet Once a day, PredniSONE 5 MG 5mg  Tablet 1 tab Once a day, Methotrexate 2.5MG  Tablet TAKE 6 TABLETS ONCE WEEKLY , Fioricet 50-325-40 MG Tablet 1 tablet as needed for tension headache every 4 hrs, Pantoprazole Sodium 40MG  Tablet Delayed Release 1 tablet once a day, Lorazepam 2 MG Tablet 1  tablet once daily at bedtime, Fosamax unsure Tablet 1 tablet Once a month, Medication List reviewed and reconciled with the patient       Allergies: Codeine (for allergy): nausea, Seafood: swelling, Peanuts: tongue lips swell .       Objective:     Vitals: Wt w/c, Ht 62, Pulse sitting 60, BP sitting 120/78.       Examination:  Cardiology, General:  GENERAL APPEARANCE: pleasant, NAD.  HEENT: unremarkable.  CAROTID UPSTROKE: normal, no bruit.  JVD: flat.  HEART SOUNDS: regular, normal S1, S2, no S3 or S4.  MURMUR: absent.  LUNGS: no rales or wheezes.  ABDOMEN: soft, non tender, positive bowel sounds, no  masses felt.  EXTREMITIES: no leg edema.  PERIPHERAL PULSES: 2 plus bilateral.        Assessment:     Assessment:  1. Coronary atherosclerosis of native coronary artery - 414.01 (Primary)  2. Essential hypertension, benign - 401.1  3. PVCs (premature ventricular contractions) - 427.69  4. Dyslipidemia - 272.4    Plan:     1. Coronary atherosclerosis of native coronary artery Continue Plavix Tablet, 75 MG, 1 tablet, Orally, Once a day ; Refill Nitroglycerin 0.4 mg tablet, 0.4 mg, 1 tablet as directed, SL, as directed prn chest pain, 30 days, 25, Refills 5 .  LAB: Basic Metabolic Normal    GLUCOSE 119 70-99 - mg/dL H   BUN 12 4-09 - mg/dL    CREATININE 8.11 9.14-7.82 - mg/dl    eGFR (NON-AFRICAN AMERICAN) 98 >60 - calc    eGFR (AFRICAN AMERICAN) 118 >60 - calc    SODIUM 139 136-145 - mmol/L    POTASSIUM 4.4 3.5-5.5 - mmol/L    CHLORIDE 106 98-107 - mmol/L    C02 27 22-32 - mmol/L    ANION GAP 10.5 6.0-20.0 - mmol/L    CALCIUM 9.3 8.6-10.3 - mg/dL     Mirelle Biskup M 95/62/1308 09:12:40 AM > Corson,Danielle 09/17/2012 02:24:48 PM > FOR CATH    LAB: CBC with Diff Stable    WBC 7.2 4.0-11.0 - K/ul    RBC 4.32 4.20-5.40 - M/uL    HGB 14.4 12.0-16.0 - g/dL    HCT 65.7 84.6-96.2 - %    MCH 33.3 27.0-33.0 - pg H   MPV 7.8 7.5-10.7 - fL    MCV 100.1 81.0-99.0 - fL H   MCHC 33.2 32.0-36.0 - g/dL    RDW 95.2 84.1-32.4 - %    NRBC# 0.01 -    PLT 177 150-400 - K/uL    NEUT % 76.6 43.3-71.9 - % H   NRBC% 0.10 - %    LYMPH% 14.6 16.8-43.5 - % L   MONO % 6.1 4.6-12.4 - %    EOS % 1.9 0.0-7.8 - %    BASO % 0.8 0.0-1.0 - %    NEUT # 5.5 1.9-7.2 - K/uL    LYMPH# 1.00 1.10-2.70 - K/uL L   MONO # 0.4 0.3-0.8 - K/uL    EOS # 0.1 0.0-0.6 - K/uL    BASO # 0.1 0.0-0.1 - K/uL     Rahmir Beever M 09/17/2012 09:13:18 AM > Corson,Danielle 09/17/2012 02:25:14 PM > FOR CATH    LAB: PT (Prothrombin Time) (401027) Normal    Prothrombin Time 11.1 9.1-12.0 - SEC    INR 1.0 0.8-1.2 -      Dalesha Stanback M 09/17/2012 09:12:23 AM > Corson,Danielle 09/17/2012 02:25:24 PM > FOR CATH    I am very concerned about these episodes of  chest pain that she is having that radiate into her jaw and arm. She had a normal nuclear stress test earlier in the year. I have recommended that we proceed with cardiac cath to further evaluate her stent that was place in 2008. , Risks and benefits of cardiac catheterization have been reviewed including risk of stroke, heart attack, death, bleeding, renal impariment and arterial damage. There was ample oppurtuny to answer questions. Alternatives were discussed. Patient understands and wishes to proceed.       2. Dyslipidemia Continue Zocor Tablet, 40 MG, 1 tablet every evening, Orally, Once a day .        Procedures:  Venipuncture:  Venipuncture: Smith,Michele 09/16/2012 05:03:12 PM > , performed in right arm.        Immunizations:        Labs:        Procedure Codes: 29562 ECL BMP, 85025 ECL CBC PLATELET DIFF, 13086 BLOOD COLLECTION ROUTINE VENIPUNCTURE       Preventive:         Follow Up: cath (Reason: CAD/lipids/HTN/PVC's)      Provider: Armanda Magic, MD  Patient: Jeannee, Danis DOB: Jul 05, 1936 Date: 09/16/2012

## 2012-09-29 ENCOUNTER — Encounter
Payer: Medicare Other | Attending: Physical Medicine and Rehabilitation | Admitting: Physical Medicine and Rehabilitation

## 2012-09-29 ENCOUNTER — Encounter: Payer: Self-pay | Admitting: Physical Medicine and Rehabilitation

## 2012-09-29 VITALS — BP 116/53 | HR 43 | Resp 14 | Ht 64.0 in | Wt 165.0 lb

## 2012-09-29 DIAGNOSIS — M069 Rheumatoid arthritis, unspecified: Secondary | ICD-10-CM | POA: Insufficient documentation

## 2012-09-29 DIAGNOSIS — M4712 Other spondylosis with myelopathy, cervical region: Secondary | ICD-10-CM | POA: Diagnosis not present

## 2012-09-29 DIAGNOSIS — Q762 Congenital spondylolisthesis: Secondary | ICD-10-CM | POA: Diagnosis not present

## 2012-09-29 DIAGNOSIS — I10 Essential (primary) hypertension: Secondary | ICD-10-CM | POA: Insufficient documentation

## 2012-09-29 DIAGNOSIS — Q7649 Other congenital malformations of spine, not associated with scoliosis: Secondary | ICD-10-CM

## 2012-09-29 DIAGNOSIS — R209 Unspecified disturbances of skin sensation: Secondary | ICD-10-CM | POA: Diagnosis not present

## 2012-09-29 DIAGNOSIS — M542 Cervicalgia: Secondary | ICD-10-CM | POA: Diagnosis not present

## 2012-09-29 DIAGNOSIS — M47812 Spondylosis without myelopathy or radiculopathy, cervical region: Secondary | ICD-10-CM

## 2012-09-29 DIAGNOSIS — I2510 Atherosclerotic heart disease of native coronary artery without angina pectoris: Secondary | ICD-10-CM | POA: Insufficient documentation

## 2012-09-29 DIAGNOSIS — M4316 Spondylolisthesis, lumbar region: Secondary | ICD-10-CM

## 2012-09-29 DIAGNOSIS — M4322 Fusion of spine, cervical region: Secondary | ICD-10-CM

## 2012-09-29 DIAGNOSIS — M4310 Spondylolisthesis, site unspecified: Secondary | ICD-10-CM

## 2012-09-29 DIAGNOSIS — G8929 Other chronic pain: Secondary | ICD-10-CM | POA: Insufficient documentation

## 2012-09-29 MED ORDER — OXYCODONE-ACETAMINOPHEN 7.5-500 MG PO TABS: 1.0000 | ORAL_TABLET | ORAL | Status: DC | PRN

## 2012-09-29 NOTE — Progress Notes (Signed)
Subjective:    Patient ID: Angela [name], female    DOB: [birth year], 76 y.o.   MRN: [id number]  HPI The patient complains about chronic neck pain which radiates into left arm in a C6 distribution. The patient also complains about numbness and tingling in the same distribution.  The problem has gotten worse. She has followed up with her neurosurgeon Dr. Nila Nephew, he took x-rays of her C-spine, which showed collapsed disc at C5-C7, he ordered a CT-scan and a MRI,results see assessment.  Pain Inventory Average Pain 8 Pain Right Now 7 My pain is intermittent and aching  In the last 24 hours, has pain interfered with the following? General activity 9 Relation with others 9 Enjoyment of life 9 What TIME of day is your pain at its worst? morning Sleep (in general) Good  Pain is worse with: walking, bending, inactivity and standing Pain improves with: pacing activities Relief from Meds: 8  Mobility use a walker ability to climb steps?  no do you drive?  no use a wheelchair  Function disabled: date disabled  retired I need assistance with the following:  dressing, bathing, toileting, meal prep, household duties and shopping  Neuro/Psych numbness trouble walking anxiety  Prior Studies Any changes since last visit?  yes heart cath  Physicians involved in your care Any changes since last visit?  no   Family History  Problem Relation Age of Onset  . Cancer Mother     lung cancer  . Cancer Brother     melanoma   History   Social History  . Marital Status: Married    Spouse Name: N/A    Number of Children: N/A  . Years of Education: N/A   Social History Main Topics  . Smoking status: Never Smoker   . Smokeless tobacco: Never Used  . Alcohol Use: None  . Drug Use: None  . Sexually Active: None   Other Topics Concern  . None   Social History Narrative  . None   Past Surgical History  Procedure Date  . Fracture surgery 09/2011    left ankle screw and pin  removed  . Cardiac catheterization    Past Medical History  Diagnosis Date  . Cervical stenosis of spine   . Rheumatoid arthritis   . Spondylolisthesis of lumbar region   . CAD (coronary artery disease)   . Hypertension    BP 116/53  Pulse 43  Resp 14  Ht 5\' 4"  (1.626 m)  Wt 165 lb (74.844 kg)  BMI 28.32 kg/m2  SpO2 95%     Review of Systems  Musculoskeletal: Positive for back pain and gait problem.  Neurological: Positive for numbness.  Psychiatric/Behavioral: The patient is nervous/anxious.   All other systems reviewed and are negative.       Objective:   Physical Exam Constitutional: She is oriented to person, place, and time. She appears well-developed and well-nourished.  HENT:  Head: Normocephalic.  Musculoskeletal: She exhibits tenderness.  Neurological: She is alert and oriented to person, place, and time.  Skin: Skin is warm and dry.  Psychiatric: She has a normal mood and affect.  Symmetric normal motor tone is noted throughout. Decreased muscle bulk. Muscle testing reveals 4/5 muscle strength of the upper extremity, and 5/5 of the lower extremity. Range of motion in upper and lower extremities is restricted due to her RA, fusion of ankles bilateral, fracture ankylosis of right elbow, deformity of wrist and fingers, arthritis in left shoulder, abd. 45 degrees, external  rotation 5 degrees. ROM of spine is restricted. Fine motor movements are severely restricted in both hands.  DTR in the upper and lower extremity are present and symmetric 2+. No clonus is noted.  Patient is in a wheel chair.         Assessment & Plan:  1. History of cervical stenosis/spondylosis with myelopathy, s/p PSF C-spine. Is following up with Dr. Newell Coral, who ordered Ct-scan and MRI of C-spine, which showed healed fusion occiput to C4, multifactorial spinal stenosis C4-7, and spondylolisthesis mainly C4-5. Dr. Newell Coral stated that she is not a good surgical candidate at this time. 2.  Rheumatoid arthritis. Recommended Arnica cream for joint and muscle pain to try. 3. Lumbar spondylolisthesis.  PLAN:  1. She was given one prescription for Percocet 7.5/325 1 p.o. q.6  hours p.r.n., 120.To be filled when due Her questions were encouraged and  Answered.  2. Continue with lidoderm and voltaren gel. lidoderm patches.  3. PA back in 2 months.

## 2012-09-29 NOTE — Patient Instructions (Addendum)
Stay as active as tolerated. You could try Arnica cream for your muscle and joint pain.

## 2012-10-09 DIAGNOSIS — I2510 Atherosclerotic heart disease of native coronary artery without angina pectoris: Secondary | ICD-10-CM | POA: Diagnosis not present

## 2012-10-09 DIAGNOSIS — I10 Essential (primary) hypertension: Secondary | ICD-10-CM | POA: Diagnosis not present

## 2012-10-09 DIAGNOSIS — Z48812 Encounter for surgical aftercare following surgery on the circulatory system: Secondary | ICD-10-CM | POA: Diagnosis not present

## 2012-10-21 ENCOUNTER — Telehealth: Payer: Self-pay | Admitting: *Deleted

## 2012-10-21 NOTE — Telephone Encounter (Signed)
Patient is calling about her Oxycodone prescription. Pharmacist told her that the 7.5/500mg  is no longer available. Is it ok to switch to 7.5/325mg ?

## 2012-10-21 NOTE — Telephone Encounter (Signed)
That would be fine. i guess we will need to keep that in mind for all of our patients, although i believe the vast majority are on oxy IR or percocet 325's.

## 2012-10-22 MED ORDER — OXYCODONE-ACETAMINOPHEN 7.5-325 MG PO TABS: 1.0000 | ORAL_TABLET | ORAL | Status: DC | PRN

## 2012-10-22 NOTE — Telephone Encounter (Signed)
That would be fine. Does she need them now?

## 2012-10-22 NOTE — Telephone Encounter (Signed)
Patient called stating when she picked up her 7.5mg /500mg  they shorted her 25 tabs due to not having any in stock. Patient would like to know if they additional 25 can be added into her new Rx?

## 2012-10-23 NOTE — Telephone Encounter (Signed)
Yes. Called and spoke with Jonny Ruiz at [facility]. He said he could fill patients prescription early due to patient only receiving 95 pills instead of 120.

## 2012-11-27 ENCOUNTER — Encounter: Payer: Self-pay | Admitting: Physical Medicine and Rehabilitation

## 2012-11-27 ENCOUNTER — Encounter
Payer: Medicare Other | Attending: Physical Medicine and Rehabilitation | Admitting: Physical Medicine and Rehabilitation

## 2012-11-27 VITALS — BP 119/56 | HR 76 | Resp 14 | Ht 64.0 in | Wt 160.0 lb

## 2012-11-27 DIAGNOSIS — M4712 Other spondylosis with myelopathy, cervical region: Secondary | ICD-10-CM | POA: Diagnosis not present

## 2012-11-27 DIAGNOSIS — M542 Cervicalgia: Secondary | ICD-10-CM | POA: Insufficient documentation

## 2012-11-27 DIAGNOSIS — M47812 Spondylosis without myelopathy or radiculopathy, cervical region: Secondary | ICD-10-CM | POA: Diagnosis not present

## 2012-11-27 DIAGNOSIS — M4316 Spondylolisthesis, lumbar region: Secondary | ICD-10-CM

## 2012-11-27 DIAGNOSIS — Z981 Arthrodesis status: Secondary | ICD-10-CM | POA: Diagnosis not present

## 2012-11-27 DIAGNOSIS — Q762 Congenital spondylolisthesis: Secondary | ICD-10-CM | POA: Diagnosis not present

## 2012-11-27 DIAGNOSIS — Q7649 Other congenital malformations of spine, not associated with scoliosis: Secondary | ICD-10-CM | POA: Diagnosis not present

## 2012-11-27 DIAGNOSIS — M069 Rheumatoid arthritis, unspecified: Secondary | ICD-10-CM | POA: Insufficient documentation

## 2012-11-27 DIAGNOSIS — R209 Unspecified disturbances of skin sensation: Secondary | ICD-10-CM | POA: Insufficient documentation

## 2012-11-27 DIAGNOSIS — I10 Essential (primary) hypertension: Secondary | ICD-10-CM | POA: Diagnosis not present

## 2012-11-27 DIAGNOSIS — I2510 Atherosclerotic heart disease of native coronary artery without angina pectoris: Secondary | ICD-10-CM | POA: Insufficient documentation

## 2012-11-27 DIAGNOSIS — M4322 Fusion of spine, cervical region: Secondary | ICD-10-CM

## 2012-11-27 DIAGNOSIS — G8929 Other chronic pain: Secondary | ICD-10-CM | POA: Diagnosis not present

## 2012-11-27 DIAGNOSIS — Z5181 Encounter for therapeutic drug level monitoring: Secondary | ICD-10-CM | POA: Diagnosis not present

## 2012-11-27 DIAGNOSIS — M4310 Spondylolisthesis, site unspecified: Secondary | ICD-10-CM

## 2012-11-27 MED ORDER — OXYCODONE-ACETAMINOPHEN 7.5-325 MG PO TABS: 1.0000 | ORAL_TABLET | ORAL | Status: DC | PRN

## 2012-11-27 NOTE — Progress Notes (Signed)
Subjective:    Patient ID: Angela [name], female    DOB: 05-12-1936, 77 y.o.   MRN: [id number]  HPI The patient complains about chronic neck pain which radiates into left arm in a C6 distribution. The patient also complains about numbness and tingling in the same distribution.  The problem is stable at this point. She has followed up with her neurosurgeon Dr. Nila Nephew, he took x-rays of her C-spine, which showed collapsed disc at C5-C7, he ordered a CT-scan and a MRI,results see assessment.  Pain Inventory Average Pain 5 Pain Right Now 6 My pain is aching  In the last 24 hours, has pain interfered with the following? General activity 8 Relation with others 7 Enjoyment of life 6 What TIME of day is your pain at its worst? morning Sleep (in general) Good  Pain is worse with: walking, standing and some activites Pain improves with: rest, heat/ice, medication and injections Relief from Meds: 5  Mobility walk with assistance use a walker ability to climb steps?  no do you drive?  no use a wheelchair  Function disabled: date disabled  I need assistance with the following:  dressing, bathing, toileting, meal prep, household duties and shopping  Neuro/Psych bladder control problems numbness tingling trouble walking  Prior Studies Any changes since last visit?  no  Physicians involved in your care Any changes since last visit?  no   Family History  Problem Relation Age of Onset  . Cancer Mother     lung cancer  . Cancer Brother     melanoma   History   Social History  . Marital Status: Married    Spouse Name: N/A    Number of Children: N/A  . Years of Education: N/A   Social History Main Topics  . Smoking status: Never Smoker   . Smokeless tobacco: Never Used  . Alcohol Use: None  . Drug Use: None  . Sexually Active: None   Other Topics Concern  . None   Social History Narrative  . None   Past Surgical History  Procedure Date  . Fracture surgery  09/2011    left ankle screw and pin removed  . Cardiac catheterization    Past Medical History  Diagnosis Date  . Cervical stenosis of spine   . Rheumatoid arthritis   . Spondylolisthesis of lumbar region   . CAD (coronary artery disease)   . Hypertension    BP 119/56  Pulse 76  Resp 14  Ht 5\' 4"  (1.626 m)  Wt 160 lb (72.576 kg)  BMI 27.46 kg/m2  SpO2 94%    Review of Systems  HENT: Positive for neck pain.   Musculoskeletal: Positive for back pain and gait problem.  Neurological: Positive for numbness.       Tingling   All other systems reviewed and are negative.       Objective:   Physical Exam Constitutional: She is oriented to person, place, and time. She appears well-developed and well-nourished.  HENT:  Head: Normocephalic.  Musculoskeletal: She exhibits tenderness.  Neurological: She is alert and oriented to person, place, and time.  Skin: Skin is warm and dry.  Psychiatric: She has a normal mood and affect.  Symmetric normal motor tone is noted throughout. Decreased muscle bulk. Muscle testing reveals 4/5 muscle strength of the upper extremity, and 5/5 of the lower extremity. Range of motion in upper and lower extremities is restricted due to her RA, fusion of ankles bilateral, fracture ankylosis of right elbow,  deformity of wrist and fingers, arthritis in left shoulder, abd. 45 degrees, external rotation 5 degrees. ROM of spine is restricted. Fine motor movements are severely restricted in both hands.  DTR in the upper and lower extremity are present and symmetric 2+. No clonus is noted.  Patient is in a wheel chair.         Assessment & Plan:  1. History of cervical stenosis/spondylosis with myelopathy, s/p PSF C-spine. Is following up with Dr. Newell Coral, who ordered Ct-scan and MRI of C-spine, which showed healed fusion occiput to C4, multifactorial spinal stenosis C4-7, and spondylolisthesis mainly C4-5. Dr. Newell Coral stated that she is not a good surgical  candidate at this time.  2. Rheumatoid arthritis. Recommended Arnica cream for joint and muscle pain to try. Also recommended moving her hands in warm water with epson salt, she could also use a warm foot bath with epson salt for pain relief. 3. Lumbar spondylolisthesis.  PLAN:  1. She was given one prescription for Percocet 7.5/325 1 p.o. q.6  hours p.r.n., 120.To be filled when due. Her questions were encouraged and  Answered.  2. Continue with lidoderm and voltaren gel.   3. PA back in 2 months.

## 2012-11-27 NOTE — Patient Instructions (Signed)
Try to move your hands in warm water with epson salt, you can use this also for you feet.

## 2012-12-11 DIAGNOSIS — M2550 Pain in unspecified joint: Secondary | ICD-10-CM | POA: Diagnosis not present

## 2012-12-11 DIAGNOSIS — M069 Rheumatoid arthritis, unspecified: Secondary | ICD-10-CM | POA: Diagnosis not present

## 2012-12-11 DIAGNOSIS — Z79899 Other long term (current) drug therapy: Secondary | ICD-10-CM | POA: Diagnosis not present

## 2013-01-16 ENCOUNTER — Other Ambulatory Visit: Payer: Self-pay

## 2013-01-16 DIAGNOSIS — D485 Neoplasm of uncertain behavior of skin: Secondary | ICD-10-CM | POA: Diagnosis not present

## 2013-01-16 DIAGNOSIS — C4441 Basal cell carcinoma of skin of scalp and neck: Secondary | ICD-10-CM | POA: Diagnosis not present

## 2013-01-16 DIAGNOSIS — L299 Pruritus, unspecified: Secondary | ICD-10-CM | POA: Diagnosis not present

## 2013-01-16 DIAGNOSIS — C44319 Basal cell carcinoma of skin of other parts of face: Secondary | ICD-10-CM | POA: Diagnosis not present

## 2013-01-16 DIAGNOSIS — L723 Sebaceous cyst: Secondary | ICD-10-CM | POA: Diagnosis not present

## 2013-01-23 ENCOUNTER — Encounter: Payer: Self-pay | Admitting: Physical Medicine and Rehabilitation

## 2013-01-23 ENCOUNTER — Encounter
Payer: Medicare Other | Attending: Physical Medicine and Rehabilitation | Admitting: Physical Medicine and Rehabilitation

## 2013-01-23 VITALS — BP 118/61 | HR 66 | Resp 14 | Ht 64.0 in | Wt 160.0 lb

## 2013-01-23 DIAGNOSIS — R209 Unspecified disturbances of skin sensation: Secondary | ICD-10-CM | POA: Insufficient documentation

## 2013-01-23 DIAGNOSIS — M1380 Other specified arthritis, unspecified site: Secondary | ICD-10-CM | POA: Diagnosis not present

## 2013-01-23 DIAGNOSIS — M961 Postlaminectomy syndrome, not elsewhere classified: Secondary | ICD-10-CM | POA: Diagnosis not present

## 2013-01-23 DIAGNOSIS — I10 Essential (primary) hypertension: Secondary | ICD-10-CM | POA: Insufficient documentation

## 2013-01-23 DIAGNOSIS — M4712 Other spondylosis with myelopathy, cervical region: Secondary | ICD-10-CM | POA: Diagnosis not present

## 2013-01-23 DIAGNOSIS — I2510 Atherosclerotic heart disease of native coronary artery without angina pectoris: Secondary | ICD-10-CM | POA: Insufficient documentation

## 2013-01-23 DIAGNOSIS — Z981 Arthrodesis status: Secondary | ICD-10-CM | POA: Diagnosis not present

## 2013-01-23 DIAGNOSIS — Q762 Congenital spondylolisthesis: Secondary | ICD-10-CM | POA: Diagnosis not present

## 2013-01-23 DIAGNOSIS — M069 Rheumatoid arthritis, unspecified: Secondary | ICD-10-CM | POA: Diagnosis not present

## 2013-01-23 DIAGNOSIS — G8929 Other chronic pain: Secondary | ICD-10-CM | POA: Diagnosis not present

## 2013-01-23 DIAGNOSIS — M1289 Other specific arthropathies, not elsewhere classified, multiple sites: Secondary | ICD-10-CM

## 2013-01-23 MED ORDER — OXYCODONE-ACETAMINOPHEN 7.5-325 MG PO TABS: 1.0000 | ORAL_TABLET | ORAL | Status: DC | PRN

## 2013-01-23 NOTE — Patient Instructions (Signed)
Stay as active as tolerated. 

## 2013-01-23 NOTE — Progress Notes (Signed)
Subjective:    Patient ID: Angela [name], female    DOB: 1936-04-11, 77 y.o.   MRN: [id number]  HPI The patient complains about chronic neck pain which radiates into left arm in a C6 distribution. The patient also complains about numbness and tingling in the same distribution.  The problem is stable at this point. She has followed up with her neurosurgeon Dr. Nila Nephew, he took x-rays of her C-spine, which showed collapsed disc at C5-C7, he ordered a CT-scan and a MRI,results see assessment.  Pain Inventory Average Pain 7 Pain Right Now 8 My pain is sharp, burning and tingling  In the last 24 hours, has pain interfered with the following? General activity 9 Relation with others 6 Enjoyment of life 8 What TIME of day is your pain at its worst? morning Sleep (in general) Good  Pain is worse with: walking, standing and some activites Pain improves with: rest, heat/ice and medication Relief from Meds: 8  Mobility use a walker how many minutes can you walk? 3-4 ability to climb steps?  no do you drive?  no use a wheelchair needs help with transfers  Function disabled: date disabled 12 I need assistance with the following:  dressing, bathing, toileting, household duties and shopping  Neuro/Psych bladder control problems numbness trouble walking anxiety  Prior Studies Any changes since last visit?  no  Physicians involved in your care Any changes since last visit?  no   Family History  Problem Relation Age of Onset  . Cancer Mother     lung cancer  . Cancer Brother     melanoma   History   Social History  . Marital Status: Married    Spouse Name: N/A    Number of Children: N/A  . Years of Education: N/A   Social History Main Topics  . Smoking status: Never Smoker   . Smokeless tobacco: Never Used  . Alcohol Use: None  . Drug Use: None  . Sexually Active: None   Other Topics Concern  . None   Social History Narrative  . None   Past Surgical  History  Procedure Laterality Date  . Fracture surgery  09/2011    left ankle screw and pin removed  . Cardiac catheterization     Past Medical History  Diagnosis Date  . Cervical stenosis of spine   . Rheumatoid arthritis   . Spondylolisthesis of lumbar region   . CAD (coronary artery disease)   . Hypertension    BP 118/61  Pulse 66  Resp 14  Ht 5\' 4"  (1.626 m)  Wt 160 lb (72.576 kg)  BMI 27.45 kg/m2  SpO2 95%     Review of Systems  HENT: Positive for neck pain.   Respiratory: Positive for cough.   Musculoskeletal: Positive for back pain and gait problem.  Neurological: Positive for numbness.  Psychiatric/Behavioral: The patient is nervous/anxious.   All other systems reviewed and are negative.       Objective:   Physical Exam Constitutional: She is oriented to person, place, and time. She appears well-developed and well-nourished.  HENT:  Head: Normocephalic.  Musculoskeletal: She exhibits tenderness.  Neurological: She is alert and oriented to person, place, and time.  Skin: Skin is warm and dry.  Psychiatric: She has a normal mood and affect.  Symmetric normal motor tone is noted throughout. Decreased muscle bulk. Muscle testing reveals 4/5 muscle strength of the upper extremity, and 5/5 of the lower extremity. Range of motion in upper and  lower extremities is restricted due to her RA, fusion of ankles bilateral, fracture ankylosis of right elbow, deformity of wrist and fingers, arthritis in left shoulder, abd. 45 degrees, external rotation 5 degrees. ROM of spine is restricted. Fine motor movements are severely restricted in both hands.  DTR in the upper and lower extremity are present and symmetric 2+. No clonus is noted.  Patient is in a wheel chair. Deminished breathing sounds in the left lower lobe, but no rales or wheezing.        Assessment & Plan:  1. History of cervical stenosis/spondylosis with myelopathy, s/p PSF C-spine. Is following up with Dr.  Newell Coral, who ordered Ct-scan and MRI of C-spine, which showed healed fusion occiput to C4, multifactorial spinal stenosis C4-7, and spondylolisthesis mainly C4-5. Dr. Newell Coral stated that she is not a good surgical candidate at this time.  2. Rheumatoid arthritis. Recommended Arnica cream for joint and muscle pain to try. Also recommended moving her hands in warm water with epson salt, she could also use a warm foot bath with epson salt for pain relief.  3. Lumbar spondylolisthesis.  PLAN:  1. She was given one prescription for Percocet 7.5/325 1 p.o. q.6  hours p.r.n., 120.To be filled when due. Her questions were encouraged and  Answered.  2. Continue with lidoderm and voltaren gel.  3. PA back in 2 months.  Patient reported, that she had a respiratory infection, and went through one round of Ax.

## 2013-03-11 DIAGNOSIS — Z79899 Other long term (current) drug therapy: Secondary | ICD-10-CM | POA: Diagnosis not present

## 2013-03-11 DIAGNOSIS — M069 Rheumatoid arthritis, unspecified: Secondary | ICD-10-CM | POA: Diagnosis not present

## 2013-03-11 DIAGNOSIS — M2550 Pain in unspecified joint: Secondary | ICD-10-CM | POA: Diagnosis not present

## 2013-03-12 DIAGNOSIS — M810 Age-related osteoporosis without current pathological fracture: Secondary | ICD-10-CM | POA: Diagnosis not present

## 2013-03-12 DIAGNOSIS — R51 Headache: Secondary | ICD-10-CM | POA: Diagnosis not present

## 2013-03-12 DIAGNOSIS — Z0000 Encounter for general adult medical examination without abnormal findings: Secondary | ICD-10-CM | POA: Diagnosis not present

## 2013-03-12 DIAGNOSIS — K219 Gastro-esophageal reflux disease without esophagitis: Secondary | ICD-10-CM | POA: Diagnosis not present

## 2013-03-12 DIAGNOSIS — I10 Essential (primary) hypertension: Secondary | ICD-10-CM | POA: Diagnosis not present

## 2013-03-12 DIAGNOSIS — Z1331 Encounter for screening for depression: Secondary | ICD-10-CM | POA: Diagnosis not present

## 2013-03-12 DIAGNOSIS — E7800 Pure hypercholesterolemia, unspecified: Secondary | ICD-10-CM | POA: Diagnosis not present

## 2013-03-23 ENCOUNTER — Encounter: Payer: Self-pay | Admitting: Physical Medicine and Rehabilitation

## 2013-03-23 ENCOUNTER — Encounter
Payer: Medicare Other | Attending: Physical Medicine and Rehabilitation | Admitting: Physical Medicine and Rehabilitation

## 2013-03-23 VITALS — BP 119/51 | HR 63 | Resp 14 | Ht 62.0 in | Wt 162.0 lb

## 2013-03-23 DIAGNOSIS — M961 Postlaminectomy syndrome, not elsewhere classified: Secondary | ICD-10-CM

## 2013-03-23 DIAGNOSIS — R209 Unspecified disturbances of skin sensation: Secondary | ICD-10-CM | POA: Diagnosis not present

## 2013-03-23 DIAGNOSIS — M069 Rheumatoid arthritis, unspecified: Secondary | ICD-10-CM | POA: Diagnosis not present

## 2013-03-23 DIAGNOSIS — Z981 Arthrodesis status: Secondary | ICD-10-CM | POA: Insufficient documentation

## 2013-03-23 DIAGNOSIS — M545 Low back pain, unspecified: Secondary | ICD-10-CM | POA: Insufficient documentation

## 2013-03-23 DIAGNOSIS — M4712 Other spondylosis with myelopathy, cervical region: Secondary | ICD-10-CM | POA: Diagnosis not present

## 2013-03-23 DIAGNOSIS — M542 Cervicalgia: Secondary | ICD-10-CM | POA: Insufficient documentation

## 2013-03-23 DIAGNOSIS — I10 Essential (primary) hypertension: Secondary | ICD-10-CM | POA: Diagnosis not present

## 2013-03-23 DIAGNOSIS — I2510 Atherosclerotic heart disease of native coronary artery without angina pectoris: Secondary | ICD-10-CM | POA: Diagnosis not present

## 2013-03-23 DIAGNOSIS — Q762 Congenital spondylolisthesis: Secondary | ICD-10-CM | POA: Insufficient documentation

## 2013-03-23 DIAGNOSIS — G8929 Other chronic pain: Secondary | ICD-10-CM | POA: Insufficient documentation

## 2013-03-23 MED ORDER — OXYCODONE-ACETAMINOPHEN 7.5-325 MG PO TABS: 1.0000 | ORAL_TABLET | ORAL | Status: DC | PRN

## 2013-03-23 NOTE — Patient Instructions (Signed)
Stay as active as pain permits 

## 2013-03-23 NOTE — Progress Notes (Signed)
Subjective:    Patient ID: Angela [name], female    DOB: [birth year], 77 y.o.   MRN: [id number]  HPI The patient complains about chronic neck pain which radiates into left arm in a C6 distribution. The patient also complains about numbness and tingling in the same distribution.  The problem is stable at this point. She has followed up with her neurosurgeon Dr. Nila Nephew, he took x-rays of her C-spine, which showed collapsed disc at C5-C7, he ordered a CT-scan and a MRI,results see assessment. She complains about some mild LBP after using the stationary bike regularly.  Pain Inventory Average Pain 8 Pain Right Now 8 My pain is sharp and burning  In the last 24 hours, has pain interfered with the following? General activity 7 Relation with others 7 Enjoyment of life 7 What TIME of day is your pain at its worst? morning Sleep (in general) Good  Pain is worse with: walking, bending, standing and some activites Pain improves with: rest, heat/ice and medication Relief from Meds: 9  Mobility walk with assistance use a walker how many minutes can you walk? 5 ability to climb steps?  no do you drive?  no needs help with transfers  Function retired I need assistance with the following:  bathing, toileting, meal prep, household duties and shopping  Neuro/Psych numbness tingling trouble walking anxiety  Prior Studies Any changes since last visit?  no  Physicians involved in your care Any changes since last visit?  no   Family History  Problem Relation Age of Onset  . Cancer Mother     lung cancer  . Cancer Brother     melanoma   History   Social History  . Marital Status: Married    Spouse Name: N/A    Number of Children: N/A  . Years of Education: N/A   Social History Main Topics  . Smoking status: Never Smoker   . Smokeless tobacco: Never Used  . Alcohol Use: None  . Drug Use: None  . Sexually Active: None   Other Topics Concern  . None   Social  History Narrative  . None   Past Surgical History  Procedure Laterality Date  . Fracture surgery  09/2011    left ankle screw and pin removed  . Cardiac catheterization     Past Medical History  Diagnosis Date  . Cervical stenosis of spine   . Rheumatoid arthritis   . Spondylolisthesis of lumbar region   . CAD (coronary artery disease)   . Hypertension    BP 146/51  Pulse 63  Resp 14  Ht 5\' 2"  (1.575 m)  Wt 162 lb (73.483 kg)  BMI 29.62 kg/m2  SpO2 94%     Review of Systems  Respiratory: Positive for wheezing.   Musculoskeletal: Positive for back pain and gait problem.  Neurological: Positive for numbness.       Tingling  Psychiatric/Behavioral: The patient is nervous/anxious.   All other systems reviewed and are negative.       Objective:   Physical Exam Constitutional: She is oriented to person, place, and time. She appears well-developed and well-nourished.  HENT:  Head: Normocephalic.  Musculoskeletal: She exhibits tenderness.  Neurological: She is alert and oriented to person, place, and time.  Skin: Skin is warm and dry.  Psychiatric: She has a normal mood and affect.  Symmetric normal motor tone is noted throughout. Decreased muscle bulk. Muscle testing reveals 4/5 muscle strength of the upper extremity, and 5/5 of the  lower extremity. Range of motion in upper and lower extremities is restricted due to her RA, fusion of ankles bilateral, fracture ankylosis of right elbow, deformity of wrist and fingers, arthritis in left shoulder, abd. 45 degrees, external rotation 5 degrees. ROM of spine is restricted. Fine motor movements are severely restricted in both hands.  DTR in the upper and lower extremity are present and symmetric 2+. No clonus is noted.  Patient is in a wheel chair.  Deminished breathing sounds in the left lower lobe, but no rales or wheezing.        Assessment & Plan:  1. History of cervical stenosis/spondylosis with myelopathy, s/p PSF  C-spine. Is following up with Dr. Newell Coral, who ordered Ct-scan and MRI of C-spine, which showed healed fusion occiput to C4, multifactorial spinal stenosis C4-7, and spondylolisthesis mainly C4-5. Dr. Newell Coral stated that she is not a good surgical candidate at this time.  2. Rheumatoid arthritis. Recommended Arnica cream for joint and muscle pain to try. Also recommended moving her hands in warm water with epson salt, she could also use a warm foot bath with epson salt for pain relief.  3. Lumbar spondylolisthesis, some increased LBP after riding the stationary bike regularly for 15 min each day. Advised the patient to lean back against the support in her back, and do not lean forward for the whole time. She can also use a heating pack, and recommended that her husband could massage the paravertebral muscles after using the stationary bike. PLAN:  1. She was given one prescription for Percocet 7.5/325 1 p.o. q.6  hours p.r.n., 120.And a second one to be filled when due. Continue with the Lidoderm patches prn pain  Her questions were encouraged and  Answered.  2. Continue with lidoderm and voltaren gel.  3. PA back in 2 months.   Marland Kitchen

## 2013-03-24 DIAGNOSIS — C44319 Basal cell carcinoma of skin of other parts of face: Secondary | ICD-10-CM | POA: Diagnosis not present

## 2013-04-03 DIAGNOSIS — IMO0002 Reserved for concepts with insufficient information to code with codable children: Secondary | ICD-10-CM | POA: Diagnosis not present

## 2013-04-14 DIAGNOSIS — M810 Age-related osteoporosis without current pathological fracture: Secondary | ICD-10-CM | POA: Diagnosis not present

## 2013-04-30 DIAGNOSIS — E785 Hyperlipidemia, unspecified: Secondary | ICD-10-CM | POA: Diagnosis not present

## 2013-04-30 DIAGNOSIS — I2510 Atherosclerotic heart disease of native coronary artery without angina pectoris: Secondary | ICD-10-CM | POA: Diagnosis not present

## 2013-04-30 DIAGNOSIS — I4949 Other premature depolarization: Secondary | ICD-10-CM | POA: Diagnosis not present

## 2013-04-30 DIAGNOSIS — I10 Essential (primary) hypertension: Secondary | ICD-10-CM | POA: Diagnosis not present

## 2013-06-03 ENCOUNTER — Other Ambulatory Visit: Payer: Self-pay | Admitting: Dermatology

## 2013-06-03 DIAGNOSIS — L910 Hypertrophic scar: Secondary | ICD-10-CM | POA: Diagnosis not present

## 2013-06-03 DIAGNOSIS — L821 Other seborrheic keratosis: Secondary | ICD-10-CM | POA: Diagnosis not present

## 2013-06-03 DIAGNOSIS — D235 Other benign neoplasm of skin of trunk: Secondary | ICD-10-CM | POA: Diagnosis not present

## 2013-06-03 DIAGNOSIS — Z85828 Personal history of other malignant neoplasm of skin: Secondary | ICD-10-CM | POA: Diagnosis not present

## 2013-06-03 DIAGNOSIS — D485 Neoplasm of uncertain behavior of skin: Secondary | ICD-10-CM | POA: Diagnosis not present

## 2013-06-08 ENCOUNTER — Encounter
Payer: Medicare Other | Attending: Physical Medicine and Rehabilitation | Admitting: Physical Medicine and Rehabilitation

## 2013-06-08 ENCOUNTER — Encounter: Payer: Self-pay | Admitting: Physical Medicine and Rehabilitation

## 2013-06-08 VITALS — BP 120/70 | HR 74 | Resp 16 | Ht 62.0 in | Wt 162.0 lb

## 2013-06-08 DIAGNOSIS — Z981 Arthrodesis status: Secondary | ICD-10-CM | POA: Diagnosis not present

## 2013-06-08 DIAGNOSIS — M4712 Other spondylosis with myelopathy, cervical region: Secondary | ICD-10-CM | POA: Diagnosis not present

## 2013-06-08 DIAGNOSIS — I2510 Atherosclerotic heart disease of native coronary artery without angina pectoris: Secondary | ICD-10-CM | POA: Insufficient documentation

## 2013-06-08 DIAGNOSIS — R209 Unspecified disturbances of skin sensation: Secondary | ICD-10-CM | POA: Diagnosis not present

## 2013-06-08 DIAGNOSIS — Z79899 Other long term (current) drug therapy: Secondary | ICD-10-CM

## 2013-06-08 DIAGNOSIS — I10 Essential (primary) hypertension: Secondary | ICD-10-CM | POA: Insufficient documentation

## 2013-06-08 DIAGNOSIS — G8929 Other chronic pain: Secondary | ICD-10-CM | POA: Diagnosis not present

## 2013-06-08 DIAGNOSIS — Z5181 Encounter for therapeutic drug level monitoring: Secondary | ICD-10-CM

## 2013-06-08 DIAGNOSIS — M069 Rheumatoid arthritis, unspecified: Secondary | ICD-10-CM | POA: Insufficient documentation

## 2013-06-08 DIAGNOSIS — M542 Cervicalgia: Secondary | ICD-10-CM | POA: Diagnosis not present

## 2013-06-08 DIAGNOSIS — Q762 Congenital spondylolisthesis: Secondary | ICD-10-CM | POA: Diagnosis not present

## 2013-06-08 DIAGNOSIS — M961 Postlaminectomy syndrome, not elsewhere classified: Secondary | ICD-10-CM

## 2013-06-08 MED ORDER — OXYCODONE-ACETAMINOPHEN 7.5-325 MG PO TABS: 1.0000 | ORAL_TABLET | ORAL | Status: DC | PRN

## 2013-06-08 NOTE — Patient Instructions (Signed)
Stay as active as tolerated. 

## 2013-06-08 NOTE — Progress Notes (Signed)
Subjective:    Patient ID: Angela [name], female    DOB: 1936-08-07, 77 y.o.   MRN: [id number]  HPI The patient complains about chronic neck pain which radiates into left arm in a C6 distribution. The patient also complains about numbness and tingling in the same distribution.  The problem is stable at this point. She has followed up with her neurosurgeon Dr. Nila Nephew, he took x-rays of her C-spine, which showed collapsed disc at C5-C7, he ordered a CT-scan and a MRI,results see assessment.  The patient reports that her husband was dx with lung cancer stage 4. She complains about some increased pain in the last 2 month, because she had to be more active and walk much more when she accompanied her husband to his treatments. She states that she took about 3 tablets of her oxycodones per day, she did not have to do this for several month.  Pain Inventory Average Pain 9 Pain Right Now 9 My pain is constant and aching  In the last 24 hours, has pain interfered with the following? General activity 9 Relation with others 9 Enjoyment of life 9 What TIME of day is your pain at its worst? morning Sleep (in general) Good  Pain is worse with: walking, bending and standing Pain improves with: heat/ice Relief from Meds: 8  Mobility use a walker how many minutes can you walk? 1 ability to climb steps?  no do you drive?  no use a wheelchair transfers alone  Function retired I need assistance with the following:  dressing, bathing, toileting, meal prep, household duties and shopping  Neuro/Psych numbness tingling trouble walking anxiety  Prior Studies Any changes since last visit?  no  Physicians involved in your care Any changes since last visit?  no   Family History  Problem Relation Age of Onset  . Cancer Mother     lung cancer  . Cancer Brother     melanoma   History   Social History  . Marital Status: Married    Spouse Name: N/A    Number of Children: N/A  .  Years of Education: N/A   Social History Main Topics  . Smoking status: Never Smoker   . Smokeless tobacco: Never Used  . Alcohol Use: None  . Drug Use: None  . Sexually Active: None   Other Topics Concern  . None   Social History Narrative  . None   Past Surgical History  Procedure Laterality Date  . Fracture surgery  09/2011    left ankle screw and pin removed  . Cardiac catheterization     Past Medical History  Diagnosis Date  . Cervical stenosis of spine   . Rheumatoid arthritis(714.0)   . Spondylolisthesis of lumbar region   . CAD (coronary artery disease)   . Hypertension    BP 132/42  Pulse 74  Resp 16  Ht 5\' 2"  (1.575 m)  Wt 162 lb (73.483 kg)  BMI 29.62 kg/m2  SpO2 96%     Review of Systems  Cardiovascular: Positive for leg swelling.  Musculoskeletal: Positive for gait problem.  Neurological: Positive for numbness.  Hematological: Bruises/bleeds easily.  Psychiatric/Behavioral: The patient is nervous/anxious.   All other systems reviewed and are negative.       Objective:   Physical Exam Constitutional: She is oriented to person, place, and time. She appears well-developed and well-nourished.  HENT:  Head: Normocephalic.  Musculoskeletal: She exhibits tenderness.  Neurological: She is alert and oriented to person, place,  and time.  Skin: Skin is warm and dry.  Psychiatric: She has a normal mood and affect.  Symmetric normal motor tone is noted throughout. Decreased muscle bulk. Muscle testing reveals 4/5 muscle strength of the upper extremity, and 5/5 of the lower extremity. Range of motion in upper and lower extremities is restricted due to her RA, fusion of ankles bilateral, fracture ankylosis of right elbow, deformity of wrist and fingers, arthritis in left shoulder, abd. 45 degrees, external rotation 5 degrees. ROM of spine is restricted. Fine motor movements are severely restricted in both hands.  DTR in the upper and lower extremity are  present and symmetric 2+. No clonus is noted.  Patient is in a wheel chair.          Assessment & Plan:  1. History of cervical stenosis/spondylosis with myelopathy, s/p PSF C-spine. Is following up with Dr. Newell Coral, who ordered Ct-scan and MRI of C-spine, which showed healed fusion occiput to C4, multifactorial spinal stenosis C4-7, and spondylolisthesis mainly C4-5. Dr. Newell Coral stated that she is not a good surgical candidate at this time.  2. Rheumatoid arthritis. Recommended Arnica cream for joint and muscle pain to try. Also recommended moving her hands in warm water with epson salt, she could also use a warm foot bath with epson salt for pain relief.  3. Lumbar spondylolisthesis, some increased LBP after being more on her feet, since her husband's dx of lung cancer. PLAN:  1. She was given one prescription for Percocet 7.5/325 1 p.o. q.6  hours p.r.n., 120.And a second one to be filled when due.  Continue with the Lidoderm patches prn pain  Her questions were encouraged and  Answered.  2. Continue with lidoderm and voltaren gel.  3. PA back in 2 months.

## 2013-06-15 ENCOUNTER — Inpatient Hospital Stay (HOSPITAL_COMMUNITY)
Admission: EM | Admit: 2013-06-15 | Discharge: 2013-06-23 | DRG: 683 | Disposition: A | Discharge status: Skilled Nursing Facility | Payer: Medicare Other | Attending: Internal Medicine | Admitting: Internal Medicine

## 2013-06-15 ENCOUNTER — Encounter (HOSPITAL_COMMUNITY): Payer: Self-pay | Admitting: Emergency Medicine

## 2013-06-15 ENCOUNTER — Inpatient Hospital Stay (HOSPITAL_COMMUNITY): Payer: Medicare Other

## 2013-06-15 DIAGNOSIS — B370 Candidal stomatitis: Secondary | ICD-10-CM | POA: Diagnosis not present

## 2013-06-15 DIAGNOSIS — F411 Generalized anxiety disorder: Secondary | ICD-10-CM | POA: Diagnosis present

## 2013-06-15 DIAGNOSIS — R269 Unspecified abnormalities of gait and mobility: Secondary | ICD-10-CM | POA: Diagnosis not present

## 2013-06-15 DIAGNOSIS — R197 Diarrhea, unspecified: Secondary | ICD-10-CM | POA: Diagnosis not present

## 2013-06-15 DIAGNOSIS — I10 Essential (primary) hypertension: Secondary | ICD-10-CM | POA: Diagnosis present

## 2013-06-15 DIAGNOSIS — N179 Acute kidney failure, unspecified: Secondary | ICD-10-CM | POA: Diagnosis not present

## 2013-06-15 DIAGNOSIS — R4182 Altered mental status, unspecified: Secondary | ICD-10-CM | POA: Diagnosis present

## 2013-06-15 DIAGNOSIS — R5383 Other fatigue: Secondary | ICD-10-CM | POA: Diagnosis not present

## 2013-06-15 DIAGNOSIS — N390 Urinary tract infection, site not specified: Secondary | ICD-10-CM | POA: Diagnosis present

## 2013-06-15 DIAGNOSIS — J984 Other disorders of lung: Secondary | ICD-10-CM | POA: Diagnosis not present

## 2013-06-15 DIAGNOSIS — R404 Transient alteration of awareness: Secondary | ICD-10-CM | POA: Diagnosis not present

## 2013-06-15 DIAGNOSIS — J45909 Unspecified asthma, uncomplicated: Secondary | ICD-10-CM | POA: Diagnosis present

## 2013-06-15 DIAGNOSIS — E874 Mixed disorder of acid-base balance: Secondary | ICD-10-CM | POA: Diagnosis not present

## 2013-06-15 DIAGNOSIS — I25810 Atherosclerosis of coronary artery bypass graft(s) without angina pectoris: Secondary | ICD-10-CM

## 2013-06-15 DIAGNOSIS — E876 Hypokalemia: Secondary | ICD-10-CM | POA: Diagnosis present

## 2013-06-15 DIAGNOSIS — D899 Disorder involving the immune mechanism, unspecified: Secondary | ICD-10-CM | POA: Diagnosis present

## 2013-06-15 DIAGNOSIS — I959 Hypotension, unspecified: Secondary | ICD-10-CM | POA: Diagnosis present

## 2013-06-15 DIAGNOSIS — K5289 Other specified noninfective gastroenteritis and colitis: Secondary | ICD-10-CM | POA: Diagnosis present

## 2013-06-15 DIAGNOSIS — I809 Phlebitis and thrombophlebitis of unspecified site: Secondary | ICD-10-CM | POA: Diagnosis not present

## 2013-06-15 DIAGNOSIS — K219 Gastro-esophageal reflux disease without esophagitis: Secondary | ICD-10-CM | POA: Diagnosis present

## 2013-06-15 DIAGNOSIS — E878 Other disorders of electrolyte and fluid balance, not elsewhere classified: Secondary | ICD-10-CM | POA: Diagnosis present

## 2013-06-15 DIAGNOSIS — I2510 Atherosclerotic heart disease of native coronary artery without angina pectoris: Secondary | ICD-10-CM | POA: Diagnosis present

## 2013-06-15 DIAGNOSIS — A0472 Enterocolitis due to Clostridium difficile, not specified as recurrent: Secondary | ICD-10-CM | POA: Diagnosis not present

## 2013-06-15 DIAGNOSIS — I359 Nonrheumatic aortic valve disorder, unspecified: Secondary | ICD-10-CM | POA: Diagnosis present

## 2013-06-15 DIAGNOSIS — M069 Rheumatoid arthritis, unspecified: Secondary | ICD-10-CM | POA: Diagnosis not present

## 2013-06-15 DIAGNOSIS — G8929 Other chronic pain: Secondary | ICD-10-CM | POA: Diagnosis present

## 2013-06-15 DIAGNOSIS — Z9181 History of falling: Secondary | ICD-10-CM | POA: Diagnosis not present

## 2013-06-15 DIAGNOSIS — I4891 Unspecified atrial fibrillation: Secondary | ICD-10-CM

## 2013-06-15 DIAGNOSIS — E785 Hyperlipidemia, unspecified: Secondary | ICD-10-CM | POA: Diagnosis present

## 2013-06-15 DIAGNOSIS — J90 Pleural effusion, not elsewhere classified: Secondary | ICD-10-CM | POA: Diagnosis not present

## 2013-06-15 DIAGNOSIS — T451X4A Poisoning by antineoplastic and immunosuppressive drugs, undetermined, initial encounter: Secondary | ICD-10-CM | POA: Diagnosis present

## 2013-06-15 DIAGNOSIS — E872 Acidosis, unspecified: Secondary | ICD-10-CM | POA: Diagnosis not present

## 2013-06-15 DIAGNOSIS — E869 Volume depletion, unspecified: Secondary | ICD-10-CM | POA: Diagnosis present

## 2013-06-15 DIAGNOSIS — T4591XA Poisoning by unspecified primarily systemic and hematological agent, accidental (unintentional), initial encounter: Secondary | ICD-10-CM | POA: Diagnosis present

## 2013-06-15 DIAGNOSIS — E871 Hypo-osmolality and hyponatremia: Secondary | ICD-10-CM

## 2013-06-15 DIAGNOSIS — E860 Dehydration: Secondary | ICD-10-CM

## 2013-06-15 DIAGNOSIS — M810 Age-related osteoporosis without current pathological fracture: Secondary | ICD-10-CM | POA: Diagnosis present

## 2013-06-15 DIAGNOSIS — E669 Obesity, unspecified: Secondary | ICD-10-CM | POA: Diagnosis present

## 2013-06-15 DIAGNOSIS — N189 Chronic kidney disease, unspecified: Secondary | ICD-10-CM | POA: Diagnosis not present

## 2013-06-15 DIAGNOSIS — Z5189 Encounter for other specified aftercare: Secondary | ICD-10-CM | POA: Diagnosis not present

## 2013-06-15 HISTORY — DX: Hypo-osmolality and hyponatremia: E87.1

## 2013-06-15 HISTORY — DX: Unspecified atrial fibrillation: I48.91

## 2013-06-15 HISTORY — DX: Acute kidney failure, unspecified: N17.9

## 2013-06-15 LAB — BLOOD GAS, ARTERIAL
Acid-base deficit: 15.4 mmol/L — ABNORMAL HIGH (ref 0.0–2.0)
TCO2: 10.2 mmol/L (ref 0–100)
pCO2 arterial: 18.8 mmHg — CL (ref 35.0–45.0)
pH, Arterial: 7.329 — ABNORMAL LOW (ref 7.350–7.450)
pO2, Arterial: 91.6 mmHg (ref 80.0–100.0)

## 2013-06-15 LAB — CBC WITH DIFFERENTIAL/PLATELET
Blasts: 0 %
Eosinophils Absolute: 0 10*3/uL (ref 0.0–0.7)
Eosinophils Relative: 0 % (ref 0–5)
MCH: 33.1 pg (ref 26.0–34.0)
Myelocytes: 0 %
Neutro Abs: 9.3 10*3/uL — ABNORMAL HIGH (ref 1.7–7.7)
Neutrophils Relative %: 87 % — ABNORMAL HIGH (ref 43–77)
Platelets: 206 10*3/uL (ref 150–400)
Promyelocytes Absolute: 0 %
RBC: 4.72 MIL/uL (ref 3.87–5.11)
RDW: 13.7 % (ref 11.5–15.5)
nRBC: 0 /100 WBC

## 2013-06-15 LAB — BASIC METABOLIC PANEL WITH GFR
BUN: 90 mg/dL — ABNORMAL HIGH (ref 6–23)
CO2: 7 meq/L — CL (ref 19–32)
Calcium: 7.9 mg/dL — ABNORMAL LOW (ref 8.4–10.5)
Chloride: 94 meq/L — ABNORMAL LOW (ref 96–112)
Creatinine, Ser: 6.33 mg/dL — ABNORMAL HIGH (ref 0.50–1.10)
GFR calc Af Amer: 7 mL/min — ABNORMAL LOW
GFR calc non Af Amer: 6 mL/min — ABNORMAL LOW
Glucose, Bld: 122 mg/dL — ABNORMAL HIGH (ref 70–99)
Potassium: 2.6 meq/L — CL (ref 3.5–5.1)
Sodium: 124 meq/L — ABNORMAL LOW (ref 135–145)

## 2013-06-15 LAB — BASIC METABOLIC PANEL
CO2: 10 mEq/L — CL (ref 19–32)
Chloride: 81 mEq/L — ABNORMAL LOW (ref 96–112)
Glucose, Bld: 154 mg/dL — ABNORMAL HIGH (ref 70–99)
Potassium: 2.8 mEq/L — ABNORMAL LOW (ref 3.5–5.1)
Sodium: 119 mEq/L — CL (ref 135–145)

## 2013-06-15 LAB — POCT I-STAT, CHEM 8
BUN: 106 mg/dL — ABNORMAL HIGH (ref 6–23)
Creatinine, Ser: 8.6 mg/dL — ABNORMAL HIGH (ref 0.50–1.10)
Glucose, Bld: 111 mg/dL — ABNORMAL HIGH (ref 70–99)
Hemoglobin: 15 g/dL (ref 12.0–15.0)
TCO2: 14 mmol/L (ref 0–100)

## 2013-06-15 LAB — POCT I-STAT 3, VENOUS BLOOD GAS (G3P V)
Bicarbonate: 3.9 mEq/L — ABNORMAL LOW (ref 20.0–24.0)
O2 Saturation: 62 %
TCO2: <5 mmol/L (ref 0–100)

## 2013-06-15 LAB — HEPATIC FUNCTION PANEL
ALT: 17 U/L (ref 0–35)
Alkaline Phosphatase: 70 U/L (ref 39–117)
Bilirubin, Direct: 0.1 mg/dL (ref 0.0–0.3)
Indirect Bilirubin: 0.4 mg/dL (ref 0.3–0.9)
Total Bilirubin: 0.5 mg/dL (ref 0.3–1.2)

## 2013-06-15 LAB — STREP PNEUMONIAE URINARY ANTIGEN: Strep Pneumo Urinary Antigen: NEGATIVE

## 2013-06-15 LAB — URINALYSIS, ROUTINE W REFLEX MICROSCOPIC
Glucose, UA: NEGATIVE mg/dL
Ketones, ur: NEGATIVE mg/dL
Leukocytes, UA: NEGATIVE
Nitrite: NEGATIVE
Protein, ur: NEGATIVE mg/dL
Urobilinogen, UA: 0.2 mg/dL (ref 0.0–1.0)

## 2013-06-15 LAB — URINE MICROSCOPIC-ADD ON

## 2013-06-15 LAB — PROCALCITONIN: Procalcitonin: 17.58 ng/mL

## 2013-06-15 LAB — MRSA PCR SCREENING: MRSA by PCR: NEGATIVE

## 2013-06-15 MED ORDER — METRONIDAZOLE IN NACL 5-0.79 MG/ML-% IV SOLN
500.0000 mg | Freq: Three times a day (TID) | INTRAVENOUS | Status: DC
  Administered 2013-06-15 – 2013-06-17 (×5): 500 mg via INTRAVENOUS
  Filled 2013-06-15 (×6): qty 100

## 2013-06-15 MED ORDER — SODIUM CHLORIDE 0.9 % IV BOLUS (SEPSIS)
1000.0000 mL | Freq: Once | INTRAVENOUS | Status: AC
  Administered 2013-06-15: 1000 mL via INTRAVENOUS

## 2013-06-15 MED ORDER — SODIUM BICARBONATE 8.4 % IV SOLN
INTRAVENOUS | Status: DC
  Administered 2013-06-15 – 2013-06-17 (×3): via INTRAVENOUS
  Filled 2013-06-15 (×9): qty 150

## 2013-06-15 MED ORDER — ASPIRIN 81 MG PO CHEW: 324.0000 mg | CHEWABLE_TABLET | ORAL | Status: AC

## 2013-06-15 MED ORDER — AMIODARONE HCL IN DEXTROSE 360-4.14 MG/200ML-% IV SOLN
INTRAVENOUS | Status: AC
  Administered 2013-06-15: 60 mg/h via INTRAVENOUS
  Filled 2013-06-15: qty 200

## 2013-06-15 MED ORDER — METRONIDAZOLE IN NACL 5-0.79 MG/ML-% IV SOLN: 500.0000 mg | Freq: Three times a day (TID) | INTRAVENOUS | Status: DC

## 2013-06-15 MED ORDER — SODIUM CHLORIDE 0.9 % IV SOLN: 250.0000 mL | INTRAVENOUS | Status: DC | PRN

## 2013-06-15 MED ORDER — SODIUM BICARBONATE 8.4 % IV SOLN: INTRAVENOUS | Status: DC

## 2013-06-15 MED ORDER — VANCOMYCIN 50 MG/ML ORAL SOLUTION
500.0000 mg | Freq: Four times a day (QID) | ORAL | Status: DC
  Administered 2013-06-15 – 2013-06-17 (×6): 500 mg via ORAL
  Filled 2013-06-15 (×7): qty 10

## 2013-06-15 MED ORDER — ASPIRIN 300 MG RE SUPP: 300.0000 mg | RECTAL | Status: AC

## 2013-06-15 MED ORDER — POTASSIUM CHLORIDE 10 MEQ/100ML IV SOLN
10.0000 meq | INTRAVENOUS | Status: AC
  Administered 2013-06-15 – 2013-06-16 (×4): 10 meq via INTRAVENOUS
  Filled 2013-06-15: qty 400

## 2013-06-15 MED ORDER — HYDROCORTISONE SOD SUCCINATE 100 MG IJ SOLR
50.0000 mg | Freq: Three times a day (TID) | INTRAMUSCULAR | Status: DC
  Administered 2013-06-16 – 2013-06-18 (×7): 50 mg via INTRAVENOUS
  Filled 2013-06-15 (×10): qty 1

## 2013-06-15 MED ORDER — MENTHOL 3 MG MT LOZG: 1.0000 | LOZENGE | OROMUCOSAL | Status: DC | PRN

## 2013-06-15 MED ORDER — FLUCONAZOLE 100MG IVPB
50.0000 mg | INTRAVENOUS | Status: DC
  Administered 2013-06-15 – 2013-06-17 (×2): 50 mg via INTRAVENOUS
  Filled 2013-06-15 (×3): qty 25

## 2013-06-15 MED ORDER — FAMOTIDINE IN NACL 20-0.9 MG/50ML-% IV SOLN
20.0000 mg | Freq: Two times a day (BID) | INTRAVENOUS | Status: DC
  Administered 2013-06-15 – 2013-06-16 (×3): 20 mg via INTRAVENOUS
  Filled 2013-06-15 (×3): qty 50

## 2013-06-15 MED ORDER — POTASSIUM CHLORIDE 10 MEQ/100ML IV SOLN
10.0000 meq | Freq: Once | INTRAVENOUS | Status: AC
  Administered 2013-06-15: 10 meq via INTRAVENOUS
  Filled 2013-06-15: qty 100

## 2013-06-15 MED ORDER — AMIODARONE LOAD VIA INFUSION
150.0000 mg | Freq: Once | INTRAVENOUS | Status: AC
  Administered 2013-06-15: 150 mg via INTRAVENOUS
  Filled 2013-06-15: qty 83.34

## 2013-06-15 MED ORDER — AMIODARONE HCL IN DEXTROSE 360-4.14 MG/200ML-% IV SOLN
60.0000 mg/h | INTRAVENOUS | Status: AC
  Administered 2013-06-16: 60 mg/h via INTRAVENOUS
  Filled 2013-06-15: qty 200

## 2013-06-15 MED ORDER — ONDANSETRON HCL 4 MG/2ML IJ SOLN
4.0000 mg | Freq: Once | INTRAMUSCULAR | Status: AC
  Administered 2013-06-15: 4 mg via INTRAVENOUS
  Filled 2013-06-15: qty 2

## 2013-06-15 MED ORDER — SODIUM CHLORIDE 0.9 % IV SOLN
INTRAVENOUS | Status: DC
  Administered 2013-06-15 – 2013-06-16 (×2): via INTRAVENOUS

## 2013-06-15 MED ORDER — AMIODARONE HCL IN DEXTROSE 360-4.14 MG/200ML-% IV SOLN
30.0000 mg/h | INTRAVENOUS | Status: DC
  Administered 2013-06-16 (×2): 30 mg/h via INTRAVENOUS
  Filled 2013-06-15 (×8): qty 200

## 2013-06-15 MED ORDER — POTASSIUM CHLORIDE CRYS ER 20 MEQ PO TBCR
40.0000 meq | EXTENDED_RELEASE_TABLET | Freq: Three times a day (TID) | ORAL | Status: AC
  Administered 2013-06-15 – 2013-06-16 (×2): 40 meq via ORAL
  Filled 2013-06-15 (×2): qty 2

## 2013-06-15 NOTE — ED Provider Notes (Signed)
CSN: [id number]     Arrival date & time 06/15/13  1205 History     First MD Initiated Contact with Patient 06/15/13 1214     Chief Complaint  Patient presents with  . Dehydration   (Consider location/radiation/quality/duration/timing/severity/associated sxs/prior Treatment) Patient is a 77 y.o. female presenting with diarrhea. The history is provided by the patient.  Diarrhea Quality:  Mucous and watery Severity:  Severe Onset quality:  Gradual Number of episodes:  "numerous" Duration:  6 days Timing: frequent. Progression:  Unchanged Relieved by:  None tried Worsened by:  Nothing tried Ineffective treatments:  None tried Associated symptoms: abdominal pain (has had some abd pain as the diarrhea continues) and vomiting   Associated symptoms: no fever   Risk factors: recent antibiotic use (2 months ago)     Past Medical History  Diagnosis Date  . Cervical stenosis of spine   . Rheumatoid arthritis(714.0)   . Spondylolisthesis of lumbar region   . CAD (coronary artery disease)   . Hypertension   . A-fib    Past Surgical History  Procedure Laterality Date  . Fracture surgery  09/2011    left ankle screw and pin removed  . Cardiac catheterization     Family History  Problem Relation Age of Onset  . Cancer Mother     lung cancer  . Cancer Brother     melanoma   History  Substance Use Topics  . Smoking status: Never Smoker   . Smokeless tobacco: Never Used  . Alcohol Use: Not on file   OB History   Grav Para Term Preterm Abortions TAB SAB Ect Mult Living                 Review of Systems  Constitutional: Negative for fever.       Decreased PO intake  Respiratory: Negative for shortness of breath.   Cardiovascular: Negative for chest pain.  Gastrointestinal: Positive for nausea, vomiting, abdominal pain (has had some abd pain as the diarrhea continues) and diarrhea. Negative for blood in stool.  Genitourinary: Negative for dysuria.  Neurological: Positive  for weakness (generalized).  All other systems reviewed and are negative.    Allergies  Peanut-containing drug products; Reglan; Codeine; Iohexol; Peanut oil; and Shellfish allergy  Home Medications   Current Outpatient Rx  Name  Route  Sig  Dispense  Refill  . butalbital-acetaminophen-caffeine (FIORICET, ESGIC) 50-325-40 MG per tablet   Oral   Take 1 tablet by mouth as needed.         . Calcium Carbonate-Vitamin D (CALCIUM 500+D PO)   Oral   Take 1 tablet by mouth 3 (three) times daily.         . citalopram (CELEXA) 20 MG tablet               . clopidogrel (PLAVIX) 75 MG tablet   Oral   Take 75 mg by mouth daily.         Marland Kitchen EPIPEN 2-PAK 0.3 MG/0.3ML SOAJ               . fluocinonide cream (LIDEX) 0.05 %               . ibandronate (BONIVA) 150 MG tablet               . lidocaine (LIDODERM) 5 %               . LORazepam (ATIVAN) 2 MG tablet   Oral   Take  1 tablet by mouth At bedtime as needed.         . methotrexate (RHEUMATREX) 2.5 MG tablet   Oral   Take 6 tablets by mouth once a week. On Saturdays         . NITROSTAT 0.4 MG SL tablet               . oxyCODONE-acetaminophen (PERCOCET) 7.5-325 MG per tablet   Oral   Take 1 tablet by mouth every 4 (four) hours as needed for pain.   120 tablet   0   . Pantoprazole Sodium (PROTONIX PO)   Oral   Take 40 mg by mouth daily.          Marland Kitchen PREDNISONE PO   Oral   Take 5 mg by mouth daily.         Marland Kitchen PREVIDENT 0.2 % SOLN               . simvastatin (ZOCOR) 40 MG tablet   Oral   Take 40 mg by mouth daily.         . VESICARE 10 MG tablet                There were no vitals taken for this visit. Physical Exam  Vitals reviewed. Constitutional: She is oriented to person, place, and time. She appears well-developed and well-nourished.  HENT:  Head: Normocephalic and atraumatic.  Right Ear: External ear normal.  Left Ear: External ear normal.  Nose: Nose normal.   Mouth/Throat: Mucous membranes are dry.  Eyes: Right eye exhibits no discharge. Left eye exhibits no discharge.  Cardiovascular: Regular rhythm and normal heart sounds.  Tachycardia present.   Pulmonary/Chest: Effort normal and breath sounds normal.  Abdominal: Soft. There is tenderness (mild lower abd tenderness).  Neurological: She is alert and oriented to person, place, and time.  Skin: Skin is warm and dry. There is pallor.    ED Course   Procedures (including critical care time)  Labs Reviewed  CBC WITH DIFFERENTIAL - Abnormal; Notable for the following:    WBC 10.7 (*)    Hemoglobin 15.6 (*)    MCHC 39.0 (*)    Neutrophils Relative % 87 (*)    Lymphocytes Relative 7 (*)    Neutro Abs 9.3 (*)    All other components within normal limits  BASIC METABOLIC PANEL - Abnormal; Notable for the following:    Sodium 119 (*)    Potassium 2.8 (*)    Chloride 81 (*)    CO2 10 (*)    Glucose, Bld 154 (*)    BUN 100 (*)    Creatinine, Ser 8.54 (*)    GFR calc non Af Amer 4 (*)    GFR calc Af Amer 5 (*)    All other components within normal limits  HEPATIC FUNCTION PANEL - Abnormal; Notable for the following:    Albumin 2.7 (*)    All other components within normal limits  POCT I-STAT, CHEM 8 - Abnormal; Notable for the following:    Sodium 121 (*)    Potassium 2.9 (*)    Chloride 92 (*)    BUN 106 (*)    Creatinine, Ser 8.60 (*)    Glucose, Bld 111 (*)    Calcium, Ion 0.97 (*)    All other components within normal limits  POCT I-STAT 3, BLOOD GAS (G3P V) - Abnormal; Notable for the following:    pH, Ven 7.150 (*)  pCO2, Ven 11.1 (*)    Bicarbonate 3.9 (*)    Acid-base deficit 23.0 (*)    All other components within normal limits  CLOSTRIDIUM DIFFICILE BY PCR  LACTIC ACID, PLASMA  URINALYSIS, ROUTINE W REFLEX MICROSCOPIC  CG4 I-STAT (LACTIC ACID)   No results found. 1. Dehydration   2. Acute renal failure   3. Metabolic acidosis   4. Hypokalemia   5. Atrial  fibrillation    CRITICAL CARE Performed by: Pricilla Loveless T   Total critical care time: 30 minutes  Critical care time was exclusive of separately billable procedures and treating other patients.  Critical care was necessary to treat or prevent imminent or life-threatening deterioration.   Critical care was time spent personally by me on the following activities: development of treatment plan with patient and/or surrogate as well as nursing, discussions with consultants, evaluation of patient's response to treatment, examination of patient, obtaining history from patient or surrogate, ordering and performing treatments and interventions, ordering and review of laboratory studies, ordering and review of radiographic studies, pulse oximetry and re-evaluation of patient's condition.   Critical care necessary for fluid replacement for severe dehydration, careful potassium replacement and management of metabolic acidosis with frequent re-evaluation MDM  Patient has been on abx w/in last 2 months but no stool samples here in ED to test for c-diff. BP is borderline low (90s here) with tachycardia. Given 4L of IVF in ED, and will give slow IV K replacement due to severe renal insufficiency. No fevers or other signs of illness to suggest a bacterial infectious cause, thus abx held at this time. Disposition delayed due to lab malfunction causing the BMP results to be delayed.  Audree Camel, MD 06/16/13 1326

## 2013-06-15 NOTE — ED Notes (Addendum)
PT has had N,V,D since Tuesday but has stopped Saturday.  She was hypotensive at 88/40 upon EMS arrival. Pt has had 500 cc's of NS per EMS and her BP is WNL. Pt has regained color and reports she feels better. PT has hx of afib. HR 80-160 per EMS. Pt feel 2 weeks ago on R elbow; bruising and denies pain to that area. Deformity noted but pt reports it is an old injury.

## 2013-06-15 NOTE — Progress Notes (Signed)
CRITICAL VALUE ALERT  Critical value received:  Bicarb 9.6 (from ABG)  Date of notification:  06/15/13  Time of notification:  2145  Critical value read back:yes  Nurse who received alert:  L Chantella Creech RN  MD notified (1st page):  Elink  Time of first page:  2149  Responding MD:  Dr Molli Knock  Time MD responded:  2149

## 2013-06-15 NOTE — Progress Notes (Addendum)
ANTIBIOTIC CONSULT NOTE - INITIAL  Pharmacy Consult for Metronidazole, Fluconazole Indication: R/O CDiff, Oral Thrush  Allergies  Allergen Reactions  . Peanut-Containing Drug Products Anaphylaxis  . Reglan (Metoclopramide) Other (See Comments)    "Messed my mind up"  . Codeine Nausea Only  . Iohexol Swelling     Desc: pt received hypaque 60% in 1994 and had erythema, hives & lips swell.  She did ok w/o premeds in 8/05 at [facility].  Pt. takes daily prednisone for rheumatoid arthritis.  Poor venous access today (7/7/6) so we did her w/o iv contrast.   . Shellfish Allergy Swelling    Lips swell    Vital Signs: Temp: 97.9 F (36.6 C) (08/11 1244) Temp src: Oral (08/11 1244) BP: 123/86 mmHg (08/11 1330) Pulse Rate: 47 (08/11 1330) Labs:  Recent Labs  06/15/13 1243 06/15/13 1625  WBC 10.7*  --   HGB 15.6* 15.0  PLT 206  --   CREATININE 8.54* 8.60*   The CrCl is unknown because both a height and weight (above a minimum accepted value) are required for this calculation. No results found for this basename: VANCOTROUGH, VANCOPEAK, VANCORANDOM, GENTTROUGH, GENTPEAK, GENTRANDOM, TOBRATROUGH, TOBRAPEAK, TOBRARND, AMIKACINPEAK, AMIKACINTROU, AMIKACIN,  in the last 72 hours   Microbiology: No results found for this or any previous visit (from the past 720 hour(s)).  Medical History: Past Medical History  Diagnosis Date  . Cervical stenosis of spine   . Rheumatoid arthritis(714.0)   . Spondylolisthesis of lumbar region   . CAD (coronary artery disease)   . Hypertension   . A-fib    Assessment: 77 y/o F with several day history of severe N/V/D. MD note mentions antibiotic use within last 2 months. Pt is in acute renal failure with Scr 8.60 with CrCl <10. WBC 10.7. Afebrile. Would normally do PO vanco given renal function, but pt with severe N/V/D likely not able to take PO's well.   Goal of Therapy:  Eradication of infection  Plan:  -Start Flagyl 500 mg IV q8h with 14 day stop  date (DC if r/o C diff)  -Start Fluconazole 50 mg IV q48h -Trend renal function (will need fluconazole adjustment if renal function improves greatly) -F/U C diff results -F/U length of treatment with fluconazole  Thank you for allowing me to take part in this patient's care,  Abran Duke, PharmD Clinical Pharmacist Phone: [phone] Pager: [phone] 06/15/2013 6:50 PM  Addendum: 7:05 PM PO Vanco and IV Flagyl ordered per MD

## 2013-06-15 NOTE — Progress Notes (Signed)
CRITICAL VALUE ALERT  Critical value received:  Potassium 2.6, CO2 7  Date of notification:  06/15/13  Time of notification: 2245  Critical value read back:yes  Nurse who received alert:  L Nox Talent RN  MD notified (1st page):  Elink MD  Time of first page:  2246  Responding MD:  Dr Molli Knock  Time MD responded:  2246

## 2013-06-15 NOTE — H&P (Addendum)
PULMONARY  / CRITICAL CARE MEDICINE  Name: Angela [name] MRN: [id number] DOB: 1936/07/17   PCP Lillia Mountain, MD Rheum: DR Zenovia Jordan  ADMISSION DATE:  06/15/2013 CONSULTATION DATE:  06/15/13  REFERRING MD :  Dr Criss Alvine of [place]  PRIMARY SERVICE: PCCM  CHIEF COMPLAINT:  Diarreha followed by metabolic abnormalities    SIGNIFICANT EVENTS / STUDIES:  06/15/2013 - admit   LINES / TUBES: Foley 06/15/2013   CULTURES: Blood 06/15/13 Urine 06/15/13  ANTIBIOTICS: PIO vanc 06/15/13 (emp c diff)  IV Flagyl 06/15/13 (empiric C diff) IV Diflucan 06/15/13 (oral thrush)  HISTORY OF PRESENT ILLNESS:  Is provided by the patient herself and the emergency room physician. Patient presented with severe metabolic abnormalities but had clinically improved with IV fluids at the time of my evaluation in the emergency department   77 year old female with chronic pain followed by pain clinic and rheumatoid arthritis on methotrexate and prednisone according to her history followed in [facility] medical associate. Denies prior history of TNF alpha blockers. Apparently couple months ago worse on antibiotics not otherwise specified for vaginal infection not otherwise specified. After that was at baseline health which is functional and using her walker. Then on Monday, 06/08/2013 apparently pain physician noticed her to be unsteady and on this day she might of had chills but she's not sure. However, by Tuesday August 2014 developed explosive amounts of diarrhea which he describes as constant and greenish brownish in color. She denies associated blood, dysuria, fever, chills or abdominal cramping.   She adamantly denies eating any coleslaw, bad lettuce, uncooked beef or burger or eggs. Husband recently diagnosed with stage IV cancer does not have similar symptoms.   note, the right elbow is rigid flexed 90 and she says this is chronic although she is admitted to falling on 06/08/2013  PAST MEDICAL  HISTORY :  Past Medical History  Diagnosis Date  . Cervical stenosis of spine   . Rheumatoid arthritis(714.0)   . Spondylolisthesis of lumbar region   . CAD (coronary artery disease)   . Hypertension   . A-fib    Past Surgical History  Procedure Laterality Date  . Fracture surgery  09/2011    left ankle screw and pin removed  . Cardiac catheterization     Prior to Admission medications   Medication Sig Start Date End Date Taking? Authorizing Provider  butalbital-acetaminophen-caffeine (FIORICET, ESGIC) 50-325-40 MG per tablet Take 1 tablet by mouth as needed. For migraines 06/23/12  Yes Historical Provider, MD  Calcium Carbonate-Vitamin D (CALCIUM 500+D PO) Take 1 tablet by mouth 3 (three) times daily.   Yes Historical Provider, MD  citalopram (CELEXA) 20 MG tablet Take 20 mg by mouth daily as needed. For mood 03/24/13  Yes Historical Provider, MD  clopidogrel (PLAVIX) 75 MG tablet Take 75 mg by mouth every evening.    Yes Historical Provider, MD  diclofenac sodium (VOLTAREN) 1 % GEL Apply 2 g topically 2 (two) times daily as needed. For pain   Yes Historical Provider, MD  ibandronate (BONIVA) 150 MG tablet Take 150 mg by mouth every 30 (thirty) days.  08/05/12  Yes Historical Provider, MD  lidocaine (LIDODERM) 5 % Place 1-3 patches onto the skin daily as needed. For pain 12/18/12  Yes Historical Provider, MD  LORazepam (ATIVAN) 2 MG tablet Take 1 tablet by mouth At bedtime as needed. For sleep 04/03/12  Yes Historical Provider, MD  methotrexate (RHEUMATREX) 2.5 MG tablet Take 6 tablets by mouth once a week. On  Saturdays 03/01/12  Yes Historical Provider, MD  oxyCODONE-acetaminophen (PERCOCET) 7.5-325 MG per tablet Take 1 tablet by mouth every 4 (four) hours as needed for pain. 06/08/13  Yes Clydie Braun Prueter, PA-C  pantoprazole (PROTONIX) 40 MG tablet Take 40 mg by mouth daily.   Yes Historical Provider, MD  predniSONE (DELTASONE) 5 MG tablet Take 5 mg by mouth daily.   Yes Historical Provider, MD   PREVIDENT 0.2 % SOLN Take by mouth at bedtime. Rinses with about 1 capful nightly 09/05/12  Yes Historical Provider, MD  simvastatin (ZOCOR) 40 MG tablet Take 40 mg by mouth at bedtime.    Yes Historical Provider, MD  VESICARE 10 MG tablet Take 10 mg by mouth every morning.  12/22/12  Yes Historical Provider, MD  EPIPEN 2-PAK 0.3 MG/0.3ML SOAJ Inject 0.3 mg into the muscle daily as needed. For allergic reaction 03/12/13   Historical Provider, MD  NITROSTAT 0.4 MG SL tablet Place 0.4 mg under the tongue every 5 (five) minutes as needed. For chest pain 09/16/12   Historical Provider, MD   Allergies  Allergen Reactions  . Peanut-Containing Drug Products Anaphylaxis  . Reglan (Metoclopramide) Other (See Comments)    "Messed my mind up"  . Codeine Nausea Only  . Iohexol Swelling     Desc: pt received hypaque 60% in 1994 and had erythema, hives & lips swell.  She did ok w/o premeds in 8/05 at [facility].  Pt. takes daily prednisone for rheumatoid arthritis.  Poor venous access today (7/7/6) so we did her w/o iv contrast.   . Shellfish Allergy Swelling    Lips swell    FAMILY HISTORY:  Family History  Problem Relation Age of Onset  . Cancer Mother     lung cancer  . Cancer Brother     melanoma   SOCIAL HISTORY:  reports that she has never smoked. She has never used smokeless tobacco. Her alcohol and drug histories are not on file.  REVIEW OF SYSTEMS:  As history of present illness otherwise negative   SUBJECTIVE:   VITAL SIGNS: Temp:  [97.9 F (36.6 C)] 97.9 F (36.6 C) (08/11 1244) Pulse Rate:  [47-141] 85 (08/11 1830) Resp:  [18-21] 19 (08/11 1830) BP: (81-129)/(45-89) 115/55 mmHg (08/11 1830) SpO2:  [93 %-99 %] 96 % (08/11 1830) HEMODYNAMICS:   VENTILATOR SETTINGS:   INTAKE / OUTPUT: Intake/Output   None     PHYSICAL EXAMINATION: General:   frail elderly female lying in the bed looks comfortable. Looks pink and surprisingly well for her metabolic abnormalities Neuro:   alert  and oriented x3. Speech is normal. CAM-ICU negative for delirium. Moves all 4 extremities. HEENT:   her color is pink. Neck mobility is limited due to rheumatoid arthritis  Cardiovascular:   heart rate is 130 and A. fib which is chronic. There is no murmur Lungs:   Clear to auscultation bilaterally.no excess work of breathing  Abdomen:   soft, nontender, no organomegaly. Normal bowel sounds no rebound. No guarding  Musculoskeletal:   obvious deformities of rheumatoid arthritis present critical in the fingers and toes but RT Elbow is flexed 90degress, ? chronic Skin:   appears intact and is pink  LABS:  CBC Recent Labs     06/15/13  1243  06/15/13  1625  WBC  10.7*   --   HGB  15.6*  15.0  HCT  40.0  44.0  PLT  206   --    Coag's No results found for this basename:  APTT, INR,  in the last 72 hours BMET Recent Labs     06/15/13  1243  06/15/13  1625  NA  119*  121*  K  2.8*  2.9*  CL  81*  92*  CO2  10*   --   BUN  100*  106*  CREATININE  8.54*  8.60*  GLUCOSE  154*  111*   Electrolytes Recent Labs     06/15/13  1243  CALCIUM  8.5   Sepsis Markers No results found for this basename: LACTICACIDVEN, PROCALCITON, O2SATVEN,  in the last 72 hours ABG No results found for this basename: PHART, PCO2ART, PO2ART,  in the last 72 hours Liver Enzymes Recent Labs     06/15/13  1243  AST  19  ALT  17  ALKPHOS  70  BILITOT  0.5  ALBUMIN  2.7*   Cardiac Enzymes No results found for this basename: TROPONINI, PROBNP,  in the last 72 hours Glucose No results found for this basename: GLUCAP,  in the last 72 hours  Imaging No results found.     ASSESSMENT / PLAN:  PULMONARY A: Maintaining respiratory status but is at risk for intubation due to metabolic abnormalities P:    monitor   CARDIOVASCULAR A:  Chronic A. fib. Noted to be on Plavix but not on Coumadin or other anticoagulants for anticoagulation, ? Due to fall risk  - At presentation in the emergency  department had hypotension but this resolved with fluid bolus P:   monitor Hydrate   RENAL A:   Acute renal failure secondary to volume depletion with metabolic acidosis. No lactic acidosis P:    aggressively hydrate and maintain hemodynamics   GASTROINTESTINAL A:   Severe diarrhea? Cause,? C. difficile,? UTI P:    check urine culture   await C. difficile PCR Empiric rx  for C. Difficile For now hold off on empiric treatment for UTI but startdepending on urine analysis results  HEMATOLOGIC A:Nil acute P:   monitor   INFECTIOUS A:  ? Infectious cause of diarrhea P:    C. GI section   ENDOCRINE A:  At risk for relative adrenal insufficiency do to chronic prednisone therapy for rheumatoid arthritis   P:    stress dose hydrocortisone   NEUROLOGIC A:   Intact P:    monitor  Musculoskeletal  A: Rheumatoid arthritis on methotrexate 12.5 mg every Saturday and prednisone followed in [facility] . Currently Rt elbow in 90 flfexion; says is chronic P - Rt elbow XRAY -  Stress dose steroids - Continue to hold methotrexate   global:Despite her metabolic abnormalities she is very conscious and oriented. She says that she's been very functional at baseline and has never been this ill. Husband is with stage IV cancer. Therefore she is extremely keen that she gets better and gets back home as soon as possible. She says she is full code. At this point no family at bedside. I've explained the need for potential central line or  dialysis or intubation and she is in agreement with medical management   I have personally obtained a history, examined the patient, evaluated laboratory and imaging results, formulated the assessment and plan and placed orders. CRITICAL CARE: The patient is critically ill with multiple organ systems failure and requires high complexity decision making for assessment and support, frequent evaluation and titration of therapies, application of  advanced monitoring technologies and extensive interpretation of multiple databases. Critical Care Time devoted to patient care  services described in this note is 60 minutes.     Dr. Kalman Shan, M.D., [facility].C.P Pulmonary and Critical Care Medicine Staff Physician [facility] Pager: [phone], If no answer or between  15:00h - 7:00h: call [phone]  06/15/2013 7:02 PM

## 2013-06-16 LAB — BASIC METABOLIC PANEL
BUN: 85 mg/dL — ABNORMAL HIGH (ref 6–23)
BUN: 77 mg/dL — ABNORMAL HIGH (ref 6–23)
BUN: 69 mg/dL — ABNORMAL HIGH (ref 6–23)
BUN: 66 mg/dL — ABNORMAL HIGH (ref 6–23)
CO2: 13 mEq/L — ABNORMAL LOW (ref 19–32)
CO2: 15 mEq/L — ABNORMAL LOW (ref 19–32)
Calcium: 7.1 mg/dL — ABNORMAL LOW (ref 8.4–10.5)
Calcium: 7.1 mg/dL — ABNORMAL LOW (ref 8.4–10.5)
Calcium: 7.1 mg/dL — ABNORMAL LOW (ref 8.4–10.5)
Calcium: 7.2 mg/dL — ABNORMAL LOW (ref 8.4–10.5)
Creatinine, Ser: 5.76 mg/dL — ABNORMAL HIGH (ref 0.50–1.10)
Creatinine, Ser: 4.97 mg/dL — ABNORMAL HIGH (ref 0.50–1.10)
Creatinine, Ser: 4.02 mg/dL — ABNORMAL HIGH (ref 0.50–1.10)
Creatinine, Ser: 3.69 mg/dL — ABNORMAL HIGH (ref 0.50–1.10)
GFR calc Af Amer: 12 mL/min — ABNORMAL LOW (ref >90)
GFR calc Af Amer: 13 mL/min — ABNORMAL LOW (ref >90)
GFR calc non Af Amer: 8 mL/min — ABNORMAL LOW (ref >90)
GFR calc non Af Amer: 10 mL/min — ABNORMAL LOW (ref >90)
Glucose, Bld: 136 mg/dL — ABNORMAL HIGH (ref 70–99)
Glucose, Bld: 133 mg/dL — ABNORMAL HIGH (ref 70–99)
Glucose, Bld: 176 mg/dL — ABNORMAL HIGH (ref 70–99)
Potassium: 2.6 mEq/L — CL (ref 3.5–5.1)
Sodium: 127 mEq/L — ABNORMAL LOW (ref 135–145)

## 2013-06-16 LAB — POCT I-STAT 3, ART BLOOD GAS (G3+)
O2 Saturation: 97 %
Patient temperature: 98.7
TCO2: 16 mmol/L (ref 0–100)
pCO2 arterial: 22.7 mmHg — ABNORMAL LOW (ref 35.0–45.0)
pH, Arterial: 7.438 (ref 7.350–7.450)

## 2013-06-16 LAB — CBC WITH DIFFERENTIAL/PLATELET
Band Neutrophils: 0 % (ref 0–10)
Basophils Absolute: 0 10*3/uL (ref 0.0–0.1)
Blasts: 0 %
Eosinophils Absolute: 0 10*3/uL (ref 0.0–0.7)
Hemoglobin: 12.1 g/dL (ref 12.0–15.0)
MCV: 84.7 fL (ref 78.0–100.0)
Metamyelocytes Relative: 2 %
Monocytes Absolute: 0.5 10*3/uL (ref 0.1–1.0)
Myelocytes: 0 %
Neutro Abs: 5.6 10*3/uL (ref 1.7–7.7)
Platelets: 194 10*3/uL (ref 150–400)
Promyelocytes Absolute: 0 %
RBC: 3.72 MIL/uL — ABNORMAL LOW (ref 3.87–5.11)
WBC: 6.7 10*3/uL (ref 4.0–10.5)
nRBC: 0 /100 WBC

## 2013-06-16 LAB — GLUCOSE, CAPILLARY: Glucose-Capillary: 124 mg/dL — ABNORMAL HIGH (ref 70–99)

## 2013-06-16 LAB — LEGIONELLA ANTIGEN, URINE

## 2013-06-16 MED ORDER — LORAZEPAM 1 MG PO TABS: 1.0000 mg | ORAL_TABLET | Freq: Every evening | ORAL | Status: DC | PRN

## 2013-06-16 MED ORDER — POTASSIUM CHLORIDE 10 MEQ/100ML IV SOLN
10.0000 meq | Freq: Once | INTRAVENOUS | Status: AC
  Administered 2013-06-16: 10 meq via INTRAVENOUS
  Filled 2013-06-16: qty 100

## 2013-06-16 MED ORDER — CALCIUM CARBONATE-VITAMIN D 500-200 MG-UNIT PO TABS
1.0000 | ORAL_TABLET | Freq: Three times a day (TID) | ORAL | Status: DC
  Administered 2013-06-16 – 2013-06-23 (×22): 1 via ORAL
  Filled 2013-06-16 (×24): qty 1

## 2013-06-16 MED ORDER — LORAZEPAM 1 MG PO TABS
1.0000 mg | ORAL_TABLET | Freq: Every evening | ORAL | Status: DC | PRN
  Administered 2013-06-16 – 2013-06-22 (×6): 1 mg via ORAL
  Filled 2013-06-16 (×7): qty 1

## 2013-06-16 MED ORDER — MAGNESIUM SULFATE IN D5W 10-5 MG/ML-% IV SOLN
1.0000 g | Freq: Once | INTRAVENOUS | Status: AC
  Administered 2013-06-16: 1 g via INTRAVENOUS
  Filled 2013-06-16: qty 100

## 2013-06-16 MED ORDER — GERHARDT'S BUTT CREAM
TOPICAL_CREAM | Freq: Two times a day (BID) | CUTANEOUS | Status: DC
  Administered 2013-06-16 – 2013-06-17 (×3): via TOPICAL
  Administered 2013-06-17: 1 via TOPICAL
  Administered 2013-06-18: 22:00:00 via TOPICAL
  Administered 2013-06-18: 1 via TOPICAL
  Administered 2013-06-19 – 2013-06-22 (×8): via TOPICAL
  Administered 2013-06-23: 1 via TOPICAL
  Filled 2013-06-16 (×2): qty 1

## 2013-06-16 MED ORDER — INSULIN ASPART 100 UNIT/ML ~~LOC~~ SOLN
0.0000 [IU] | Freq: Three times a day (TID) | SUBCUTANEOUS | Status: DC
  Administered 2013-06-16 – 2013-06-17 (×4): 3 [IU] via SUBCUTANEOUS
  Administered 2013-06-19: 2 [IU] via SUBCUTANEOUS

## 2013-06-16 MED ORDER — LIDOCAINE 5 % EX PTCH
1.0000 | MEDICATED_PATCH | Freq: Every day | CUTANEOUS | Status: DC | PRN
  Administered 2013-06-16 – 2013-06-22 (×6): 1 via TRANSDERMAL
  Filled 2013-06-16 (×2): qty 3

## 2013-06-16 MED ORDER — PANTOPRAZOLE SODIUM 40 MG IV SOLR
40.0000 mg | INTRAVENOUS | Status: DC
  Administered 2013-06-16: 40 mg via INTRAVENOUS
  Filled 2013-06-16 (×2): qty 40

## 2013-06-16 MED ORDER — POTASSIUM CHLORIDE CRYS ER 20 MEQ PO TBCR
40.0000 meq | EXTENDED_RELEASE_TABLET | Freq: Once | ORAL | Status: AC
  Administered 2013-06-16: 40 meq via ORAL
  Filled 2013-06-16: qty 2

## 2013-06-16 MED ORDER — MAGNESIUM SULFATE 50 % IJ SOLN
1.0000 g | Freq: Once | INTRAMUSCULAR | Status: DC
  Filled 2013-06-16: qty 2

## 2013-06-16 MED ORDER — CITALOPRAM HYDROBROMIDE 20 MG PO TABS
20.0000 mg | ORAL_TABLET | Freq: Every day | ORAL | Status: DC
  Administered 2013-06-16 – 2013-06-18 (×3): 20 mg via ORAL
  Filled 2013-06-16 (×4): qty 1

## 2013-06-16 NOTE — Clinical Documentation Improvement (Signed)
THIS DOCUMENT IS NOT A PERMANENT PART OF THE MEDICAL RECORD  Please update your documentation with the medical record to reflect your response to this query. If you need help knowing how to do this please call [phone].  06/16/13  Dear Dr. Delton Coombes Marton Redwood,  In a better effort to capture your patient's severity of illness, reflect appropriate length of stay and utilization of resources, a review of the patient medical record has revealed the following indicators.    Based on your clinical judgment, please clarify and document in a progress note and/or discharge summary the clinical condition associated with the following supporting information:   Possible Clinical Conditions?  Septicemia / Sepsis Severe Sepsis SIRS Septic Shock Sepsis with UTI Candidal Sepsis Other Condition  Cannot clinically Determine     Risk Factors:  Immunocompromised secondary to methotrexate use, thrush; fluconazole for thrush x 3 days; questionable UTI noted per 8/12 progress notes.  Hypotensive per 8/11 ED note with heart rates up to 159, respiratory rates in the mid twenties, temp up to 100.8.    Reviewed: additional documentation in the medical record  Thank You,  Marciano Sequin,  Clinical Documentation Specialist: [phone] Health Information Management [facility]

## 2013-06-16 NOTE — Progress Notes (Signed)
CRITICAL VALUE ALERT  Critical value received:  Potassium 2.6  Date of notification:  06/16/13  Time of notification: 1826  Critical value read back: yes  Nurse who received alert:  Linton Flemings  MD notified (1st page):  Dr. Sherene Sires  Time of first page:  1830  MD notified (2nd page):  Time of second page:  Responding MD: Dr. Sherene Sires  Time MD responded:  [phone]

## 2013-06-16 NOTE — Progress Notes (Signed)
Name: Angela [name] MRN: [id number] DOB: 1936/07/11  ELECTRONIC ICU PHYSICIAN NOTE  Problem:  Hypokalemia, severe with Creat > 4   Intervention:  KCl x 40 meq po only   Sandrea Hughs 06/16/2013, 6:39 PM

## 2013-06-16 NOTE — Care Management Note (Signed)
    Page 1 of 1   06/16/2013     10:56:01 AM   CARE MANAGEMENT NOTE 06/16/2013  Patient:  Angela [name], Angela [name]   Account Number:  [id number]  Date Initiated:  06/16/2013  Documentation initiated by:  Avie Arenas  Subjective/Objective Assessment:   Hypotension - metabolic abnormalities.  High risk for intubation.     Action/Plan:   Anticipated DC Date:  06/22/2013   Anticipated DC Plan:  HOME W HOME HEALTH SERVICES      DC Planning Services  CM consult      Choice offered to / List presented to:             Status of service:  In process, will continue to follow Medicare Important Message given?   (If response is "NO", the following Medicare IM given date fields will be blank) Date Medicare IM given:   Date Additional Medicare IM given:    Discharge Disposition:    Per UR Regulation:  Reviewed for med. necessity/level of care/duration of stay  If discussed at Long Length of Stay Meetings, dates discussed:    Comments:  Contact:  Geiselman,William Spouse [phone]   [phone]  06-16-13 - 8:40am Avie Arenas, RNBSN [phone] Lives at home with husband who has Stage IV ca.  May need higher level of care - has had [facility] previoiusly. Patient with husband in room - patient has been recieving radiation - patient gets along with walker.  Both agree they need assistance - it is too much for them.  patient has two daughters - one in [place] and one in [place] - husband had a son here in [place].  duaghters busy with work and families - hate to impose.  Son does not assist them.  patient agreeable to rehab - very agreeable to - may even be ltach candidate, but not sure what will happen to husband.  He states he will just get by.  Have talked about AL but have not looked into.  Interested in options.  SW consult placed. Will need PT ordered when medically able.

## 2013-06-16 NOTE — Progress Notes (Signed)
PULMONARY  / CRITICAL CARE MEDICINE  Name: Angela [name] MRN: [id number] DOB: [birth year]   PCP Lillia Mountain, MD Rheum: DR Zenovia Jordan  ADMISSION DATE:  06/15/2013 CONSULTATION DATE:  06/15/13  REFERRING MD :  Dr Criss Alvine of [place]  PRIMARY SERVICE: PCCM  CHIEF COMPLAINT:  Diarreha followed by metabolic abnormalities  BRIEF HISTORY: 77 year old female with chronic pain followed by pain clinic and rheumatoid arthritis on methotrexate and prednisone presents to [facility] ED on 06/15/13 with complaints of explosive diarrhea x 1 week.   SIGNIFICANT EVENTS / STUDIES:  06/15/13 - admissions  LINES / TUBES:  CULTURES: 8/11 BCx2>>> 8/11 UC>>> 8/12 C.diff>>>  ANTIBIOTICS: PO vanc 06/15/13 (emp c diff)  IV Flagyl 06/15/13 (empiric C diff) IV Diflucan 06/15/13 (oral thrush)  SUBJECTIVE: Patient in bed, alert and awake. Responds to questions.   VITAL SIGNS: Temp:  [97.6 F (36.4 C)-100.8 F (38.2 C)] 99.5 F (37.5 C) (08/12 0700) Pulse Rate:  [25-141] 92 (08/12 0600) Resp:  [18-25] 25 (08/12 0700) BP: (73-129)/(25-89) 92/51 mmHg (08/12 0700) SpO2:  [68 %-100 %] 97 % (08/12 0600) Weight:  [157 lb 6.5 oz (71.4 kg)-158 lb 15.2 oz (72.1 kg)] 158 lb 15.2 oz (72.1 kg) (08/12 0600) HEMODYNAMICS:   VENTILATOR SETTINGS:   INTAKE / OUTPUT: Intake/Output     08/11 0701 - 08/12 0700 08/12 0701 - 08/13 0700   I.V. (mL/kg) 2562.4 (35.5)    IV Piggyback 775    Total Intake(mL/kg) 3337.4 (46.3)    Urine (mL/kg/hr) 1080    Stool 1    Total Output 1081     Net +2256.4            PHYSICAL EXAMINATION: General:   frail elderly female, in NAD Neuro:   alert and oriented x3. Speech is normal,  Moves all 4 extremities. HEENT:  PERRL, dry MM  Cardiovascular:  Irregularly irregular Lungs:   Clear to auscultation bilaterally, no retractions Abdomen:   soft, nontender, no organomegaly. Normal bowel sounds no rebound. No guarding  Musculoskeletal:  obvious deformities of rheumatoid arthritis  present critical in the fingers and toes but RT Elbow is flexed 90 degress, ? Chronic, strength 3/5 Skin: intact Ext: slight edema in LLE  LABS:  CBC Recent Labs     06/15/13  1243  06/15/13  1625  06/16/13  0400  WBC  10.7*   --   6.7  HGB  15.6*  15.0  12.1  HCT  40.0  44.0  31.5*  PLT  206   --   194   Coag's No results found for this basename: APTT, INR,  in the last 72 hours BMET Recent Labs     06/15/13  1243  06/15/13  1625  06/15/13  2130  06/16/13  0400  NA  119*  121*  124*  126*  K  2.8*  2.9*  2.6*  3.2*  CL  81*  92*  94*  93*  CO2  10*   --   7*  13*  BUN  100*  106*  90*  85*  CREATININE  8.54*  8.60*  6.33*  5.76*  GLUCOSE  154*  111*  122*  136*   Electrolytes Recent Labs     06/15/13  1243  06/15/13  2130  06/16/13  0400  CALCIUM  8.5  7.9*  7.1*   Sepsis Markers Recent Labs     06/15/13  2130  06/16/13  0400  PROCALCITON  17.58  16.68  ABG Recent Labs     06/15/13  2134  PHART  7.329*  PCO2ART  18.8*  PO2ART  91.6   Liver Enzymes Recent Labs     06/15/13  1243  AST  19  ALT  17  ALKPHOS  70  BILITOT  0.5  ALBUMIN  2.7*   Cardiac Enzymes No results found for this basename: TROPONINI, PROBNP,  in the last 72 hours Glucose No results found for this basename: GLUCAP,  in the last 72 hours   CXR: 8/11 -  Interval development of small L pleural effusion with L basilar opacities, ? atx v. ? Infiltrate    ASSESSMENT / PLAN:  PULMONARY A: Maintaining respiratory status but is at risk for intubation due to metabolic abnormalities P:   - monitor O2 sats with goal of 90-92% on RA  CARDIOVASCULAR A:  Chronic A. fib. Noted to be on Plavix but not on Coumadin or other anticoagulants for anticoagulation, ? Due to fall risk. Episode of RVR pm 8/11 Hx of HTN Hypotension   P:  - continue hydration - continue amiodarone for rate control (started 8/11) - monitor vital signs - maintain MAP of 65 - holding home BP regimen, no  indication for dilt or b-blockade at present  RENAL A:  Acute renal failure secondary to volume depletion with metabolic acidosis. No lactic acidosis-  Baseline Scr 0.62 in 2010 Hyponatremia/hypochloremia  Hypokalemia Hypocalcemia  P:   - continue NS at 100/h - continue bicarb drip and follow BMP, renal recovery, ABG - monitor UOP, renal fxn - replace K+, low dose mag - monitor BMet q4h, mag in AM - ABG  GASTROINTESTINAL A:   Severe diarrhea? Cause,? C. difficile,? UTI NPO P:   - await C. difficile PCR, empiric therapy now - PPI for SUP  HEMATOLOGIC A:Nothing acute P:  - monitor for bleeding - SCDs for DVT prophylaxis  INFECTIOUS A:  ? Infectious cause of diarrhea, consider and rx C diff Possible UTI Immunocompromised secondary to methotrexate use Thrush P:   - continue empiric abx for C diff, tailor therapy as cultures return - flucaonzole for thrush x 3 days - monitor fever curves  ENDOCRINE A:  At risk for relative adrenal insufficiency due to chronic prednisone therapy for rheumatoid arthritis   P:   - stress dose hydrocortisone  - monitor CBGs  NEUROLOGIC A:   Intact P:   - monitor mental status changes  MUSCULOSKELETAL A: Rheumatoid arthritis on methotrexate 12.5 mg every Saturday and prednisone followed in [facility] . Currently Rt elbow in 90 flexion; says is chronic P: - Stress dose steroids - Continue to hold methotrexate (took it 06/06/13)  Today's summary: contine abx, monitor labs, replace electrolytes, IV bolus to maintain hemodynamics at goal MAP of 65   I have personally obtained a history, examined the patient, evaluated laboratory and imaging results, formulated the assessment and plan and placed orders.  CRITICAL CARE: The patient is critically ill with multiple organ systems failure and requires high complexity decision making for assessment and support, frequent evaluation and titration of therapies, application of  advanced monitoring technologies and extensive interpretation of multiple databases. Critical Care Time devoted to patient care services described in this note is 40 minutes.   Levy Pupa, MD, PhD 06/16/2013, 9:52 AM [facility] [phone] or if no answer [phone]

## 2013-06-16 NOTE — Clinical Social Work Psychosocial (Signed)
Clinical Social Work Department BRIEF PSYCHOSOCIAL ASSESSMENT 06/16/2013  Patient:  Caley,Keira G     Account Number:  [id number]     Admit date:  06/15/2013  Clinical Social Worker:  Brando Taves, LCSW  Date/Time:  06/16/2013 03:30 PM  Referred by:  Care Management  Date Referred:  06/16/2013 Referred for  Psychosocial assessment   Other Referral:   Interview type:  Family Other interview type:    PSYCHOSOCIAL DATA Living Status:  HUSBAND Admitted from facility:   Level of care:   Primary support name:  Rathje William  [phone] Primary support relationship to patient:  SPOUSE Degree of support available:   good    CURRENT CONCERNS Current Concerns  Other - See comment   Other Concerns:    SOCIAL WORK ASSESSMENT / PLAN CSW talked with pt's husband.  He states that although he has cancer he has been able to care for pt and is strong enough to physically assist her.  Husband wants to care for pt at home and HH if he can.  He does not want pt to go to a facility and does not need AL resources at this time. CSW will follow pt's progression and PT recommendations and assist if needed.   Assessment/plan status:  Psychosocial Support/Ongoing Assessment of Needs Other assessment/ plan:   Information/referral to community resources:    PATIENT'S/FAMILY'S RESPONSE TO PLAN OF CARE: Husband prefers for pt to be home with HH and feels he can physically assist her.  He stated "if I can care for her this past week I can do anything".        

## 2013-06-16 NOTE — Progress Notes (Signed)
UR Completed.  Angela [name] Jane [phone] 06/16/2013  

## 2013-06-17 ENCOUNTER — Inpatient Hospital Stay (HOSPITAL_COMMUNITY): Payer: Medicare Other

## 2013-06-17 LAB — POCT I-STAT 3, ART BLOOD GAS (G3+)
Acid-Base Excess: 4 mmol/L — ABNORMAL HIGH (ref 0.0–2.0)
O2 Saturation: 100 %
pCO2 arterial: 24.3 mmHg — ABNORMAL LOW (ref 35.0–45.0)
pO2, Arterial: 132 mmHg — ABNORMAL HIGH (ref 80.0–100.0)

## 2013-06-17 LAB — BASIC METABOLIC PANEL
BUN: 43 mg/dL — ABNORMAL HIGH (ref 6–23)
BUN: 49 mg/dL — ABNORMAL HIGH (ref 6–23)
CO2: 22 mEq/L (ref 19–32)
CO2: 23 mEq/L (ref 19–32)
CO2: 23 mEq/L (ref 19–32)
CO2: 24 mEq/L (ref 19–32)
Calcium: 7.2 mg/dL — ABNORMAL LOW (ref 8.4–10.5)
Calcium: 7.4 mg/dL — ABNORMAL LOW (ref 8.4–10.5)
Calcium: 7.5 mg/dL — ABNORMAL LOW (ref 8.4–10.5)
Calcium: 7.9 mg/dL — ABNORMAL LOW (ref 8.4–10.5)
Calcium: 8 mg/dL — ABNORMAL LOW (ref 8.4–10.5)
Chloride: 97 mEq/L (ref 96–112)
Creatinine, Ser: 2.09 mg/dL — ABNORMAL HIGH (ref 0.50–1.10)
Creatinine, Ser: 2.23 mg/dL — ABNORMAL HIGH (ref 0.50–1.10)
Creatinine, Ser: 2.53 mg/dL — ABNORMAL HIGH (ref 0.50–1.10)
Creatinine, Ser: 3.06 mg/dL — ABNORMAL HIGH (ref 0.50–1.10)
Creatinine, Ser: 3.31 mg/dL — ABNORMAL HIGH (ref 0.50–1.10)
GFR calc Af Amer: 26 mL/min — ABNORMAL LOW (ref >90)
GFR calc non Af Amer: 13 mL/min — ABNORMAL LOW (ref >90)
GFR calc non Af Amer: 14 mL/min — ABNORMAL LOW (ref >90)
GFR calc non Af Amer: 21 mL/min — ABNORMAL LOW (ref >90)
GFR calc non Af Amer: 22 mL/min — ABNORMAL LOW (ref >90)
Glucose, Bld: 127 mg/dL — ABNORMAL HIGH (ref 70–99)
Glucose, Bld: 146 mg/dL — ABNORMAL HIGH (ref 70–99)
Glucose, Bld: 198 mg/dL — ABNORMAL HIGH (ref 70–99)
Sodium: 134 mEq/L — ABNORMAL LOW (ref 135–145)
Sodium: 135 mEq/L (ref 135–145)

## 2013-06-17 LAB — GLUCOSE, CAPILLARY
Glucose-Capillary: 179 mg/dL — ABNORMAL HIGH (ref 70–99)
Glucose-Capillary: 170 mg/dL — ABNORMAL HIGH (ref 70–99)
Glucose-Capillary: 121 mg/dL — ABNORMAL HIGH (ref 70–99)

## 2013-06-17 LAB — URINE CULTURE: Culture: NO GROWTH

## 2013-06-17 LAB — CBC
MCH: 32.5 pg (ref 26.0–34.0)
MCV: 85.1 fL (ref 78.0–100.0)
Platelets: 205 10*3/uL (ref 150–400)
RDW: 13.8 % (ref 11.5–15.5)

## 2013-06-17 LAB — VANCOMYCIN, RANDOM: Vancomycin Rm: 19.3 ug/mL

## 2013-06-17 LAB — MAGNESIUM: Magnesium: 1.9 mg/dL (ref 1.5–2.5)

## 2013-06-17 MED ORDER — VANCOMYCIN HCL IN DEXTROSE 1-5 GM/200ML-% IV SOLN
1000.0000 mg | Freq: Once | INTRAVENOUS | Status: AC
  Administered 2013-06-17: 1000 mg via INTRAVENOUS

## 2013-06-17 MED ORDER — AMIODARONE HCL 200 MG PO TABS
200.0000 mg | ORAL_TABLET | Freq: Every day | ORAL | Status: DC
  Administered 2013-06-17 – 2013-06-18 (×2): 200 mg via ORAL
  Filled 2013-06-17 (×3): qty 1

## 2013-06-17 MED ORDER — SODIUM CHLORIDE 0.9 % IV SOLN
INTRAVENOUS | Status: DC
  Administered 2013-06-17 – 2013-06-20 (×4): via INTRAVENOUS

## 2013-06-17 MED ORDER — POTASSIUM CHLORIDE 10 MEQ/100ML IV SOLN
INTRAVENOUS | Status: AC
  Administered 2013-06-17: 10 meq via INTRAVENOUS
  Filled 2013-06-17: qty 400

## 2013-06-17 MED ORDER — POTASSIUM CHLORIDE 10 MEQ/100ML IV SOLN
10.0000 meq | INTRAVENOUS | Status: AC
  Administered 2013-06-17 (×3): 10 meq via INTRAVENOUS

## 2013-06-17 MED ORDER — MAGNESIUM SULFATE IN D5W 10-5 MG/ML-% IV SOLN
1.0000 g | Freq: Once | INTRAVENOUS | Status: AC
  Administered 2013-06-17: 1 g via INTRAVENOUS
  Filled 2013-06-17: qty 100

## 2013-06-17 MED ORDER — SODIUM CHLORIDE 0.9 % IV BOLUS (SEPSIS)
250.0000 mL | Freq: Once | INTRAVENOUS | Status: AC
  Administered 2013-06-17: 250 mL via INTRAVENOUS

## 2013-06-17 MED ORDER — POTASSIUM CHLORIDE CRYS ER 20 MEQ PO TBCR
40.0000 meq | EXTENDED_RELEASE_TABLET | ORAL | Status: AC
  Administered 2013-06-17 (×2): 40 meq via ORAL
  Filled 2013-06-17 (×2): qty 2

## 2013-06-17 MED ORDER — PANTOPRAZOLE SODIUM 40 MG PO TBEC
40.0000 mg | DELAYED_RELEASE_TABLET | Freq: Every day | ORAL | Status: DC
  Administered 2013-06-17 – 2013-06-18 (×2): 40 mg via ORAL
  Filled 2013-06-17 (×2): qty 1

## 2013-06-17 MED ORDER — FAMOTIDINE IN NACL 20-0.9 MG/50ML-% IV SOLN
20.0000 mg | INTRAVENOUS | Status: DC
  Administered 2013-06-17: 20 mg via INTRAVENOUS
  Filled 2013-06-17: qty 50

## 2013-06-17 NOTE — Plan of Care (Signed)
Problem: Phase I Progression Outcomes Goal: Initial discharge plan identified Outcome: Progressing ? Discharge to SNF.  Husband battling cancer, and is unable to take care of her by himself.  Discussing discharge to SNF so that she can gain some strength.

## 2013-06-17 NOTE — Progress Notes (Signed)
Patient ID: Angela [name], female   DOB: 1936/08/21, 77 y.o.   MRN: [id number]   eLink Physician Progress Note and Electrolyte Replacement  Patient Name: Angela [name] DOB: Sep 11, 1936 MRN: [id number]  Date of Service  06/17/2013   HPI/Events of Note    Recent Labs Lab 06/16/13 1210 06/16/13 1711 06/16/13 2023 06/17/13 0027 06/17/13 0350  NA 127* 130* 131* 134* 135  K 3.7 2.6* 3.0* 2.8* 2.5*  CL 94* 96 97 98 100  CO2 15* 19 19 22 23   GLUCOSE 133* 176* 142* 166* 198*  BUN 77* 69* 66* 62* 59*  CREATININE 4.97* 4.02* 3.69* 3.31* 3.06*  CALCIUM 7.1* 7.1* 7.2* 7.2* 7.4*  MG  --   --   --   --  1.9    Estimated Creatinine Clearance: 15.7 ml/min (by C-G formula based on Cr of 3.06).  Intake/Output     08/12 0701 - 08/13 0700   P.O. 150   I.V. (mL/kg) 4767.4 (66.1)   IV Piggyback 400   Total Intake(mL/kg) 5317.4 (73.8)   Urine (mL/kg/hr) 1370 (0.8)   Stool 650 (0.4)   Total Output 2020   Net +3297.4        - I/O DETAILED x 24h    Total I/O In: 2417 [I.V.:2167; IV Piggyback:250] Out: 575 [Urine:375; Stool:200] - I/O THIS SHIFT    ASSESSMENT LOW K   eICURN Interventions  kcl x 4 runs   ASSESSMENT: MAJOR ELECTROLYTE      Dr. Kalman Shan, M.D., [facility].C.P Pulmonary and Critical Care Medicine Staff Physician [facility] Pager: [phone], If no answer or between  15:00h - 7:00h: call [phone]  06/17/2013 5:22 AM

## 2013-06-17 NOTE — Progress Notes (Signed)
PULMONARY  / CRITICAL CARE MEDICINE  Name: MYRELLA FAHS MRN: [id number] DOB: 05-06-1936   PCP Lillia Mountain, MD Rheum: DR Zenovia Jordan  ADMISSION DATE:  06/15/2013 CONSULTATION DATE:  06/15/13  REFERRING MD :  Dr Criss Alvine of [place]  PRIMARY SERVICE: PCCM  CHIEF COMPLAINT:  Diarreha followed by metabolic abnormalities  BRIEF HISTORY: 77 year old female with chronic pain followed by pain clinic and rheumatoid arthritis on methotrexate and prednisone presents to [facility] ED on 06/15/13 with complaints of explosive diarrhea x 1 week.   SIGNIFICANT EVENTS / STUDIES:  06/15/13 - admission  LINES / TUBES:  CULTURES: 8/11 BCx2>>> 8/11 UC>>>neg 8/12 C.diff>>>NEG  ANTIBIOTICS: PO vanc 06/15/13>>> 8/13 IV Flagyl 06/15/13 (empiric C diff)>>>8/13 IV Diflucan 06/15/13 (oral thrush)>>> IV vanco 8/13 (GPC blood cx) >>   SUBJECTIVE:  Patient lying in bed. Pleasantly confused. AAOx3. 100% on RA.  VITAL SIGNS: Temp:  [98.2 F (36.8 C)-99.3 F (37.4 C)] 98.4 F (36.9 C) (08/13 0700) Pulse Rate:  [38-112] 109 (08/13 0700) Resp:  [17-26] 23 (08/13 0700) BP: (81-117)/(38-86) 94/79 mmHg (08/13 0700) SpO2:  [95 %-100 %] 99 % (08/13 0700) Weight:  [160 lb 7.9 oz (72.8 kg)] 160 lb 7.9 oz (72.8 kg) (08/13 0700) HEMODYNAMICS:   VENTILATOR SETTINGS:   INTAKE / OUTPUT: Intake/Output     08/12 0701 - 08/13 0700 08/13 0701 - 08/14 0700   P.O. 150    I.V. (mL/kg) 4984.1 (68.5) 217 (3)   IV Piggyback 500 100   Total Intake(mL/kg) 5634.1 (77.4) 317 (4.4)   Urine (mL/kg/hr) 1520 (0.9)    Stool 850 (0.5)    Total Output 2370     Net +3264.1 +317          PHYSICAL EXAMINATION: General:   frail elderly female, confused in NAD Neuro:   alert and oriented x3. Pleasantly confused, speech is normal,  Moves all 4 extremities. HEENT:  PERRL, dry MM  Cardiovascular:  Irregularly irregular Lungs:   Clear to auscultation bilaterally, no retractions Abdomen:   soft, nontender, no organomegaly.  Normal bowel sounds no rebound. No guarding  Musculoskeletal:  obvious deformities of rheumatoid arthritis present critical in the fingers and toes but RT Elbow is flexed 90 degress, ? Chronic, strength 3/5 Skin: intact Ext: slight edema in LLE  LABS:  CBC Recent Labs     06/15/13  1243  06/15/13  1625  06/16/13  0400  06/17/13  0350  WBC  10.7*   --   6.7  5.9  HGB  15.6*  15.0  12.1  11.1*  HCT  40.0  44.0  31.5*  29.1*  PLT  206   --   194  205   Coag's No results found for this basename: APTT, INR,  in the last 72 hours BMET Recent Labs     06/16/13  2023  06/17/13  0027  06/17/13  0350  NA  131*  134*  135  K  3.0*  2.8*  2.5*  CL  97  98  100  CO2  19  22  23   BUN  66*  62*  59*  CREATININE  3.69*  3.31*  3.06*  GLUCOSE  142*  166*  198*   Electrolytes Recent Labs     06/16/13  2023  06/17/13  0027  06/17/13  0350  CALCIUM  7.2*  7.2*  7.4*  MG   --    --   1.9   Sepsis Markers Recent Labs  06/15/13  2130  06/16/13  0400  PROCALCITON  17.58  16.68   ABG Recent Labs     06/15/13  2134  06/16/13  1103  PHART  7.329*  7.438  PCO2ART  18.8*  22.7*  PO2ART  91.6  83.0   Liver Enzymes Recent Labs     06/15/13  1243  AST  19  ALT  17  ALKPHOS  70  BILITOT  0.5  ALBUMIN  2.7*   Cardiac Enzymes No results found for this basename: TROPONINI, PROBNP,  in the last 72 hours Glucose Recent Labs     06/16/13  1158  06/16/13  1713  06/16/13  2205  GLUCAP  124*  164*  136*     CXR: 8/13 - no change, small L pleural effusion with adjacent atx or infxn   ASSESSMENT / PLAN:  PULMONARY A: Stable P:   - monitor O2 sats with goal of 90-92% on RA  CARDIOVASCULAR A:  Chronic A. fib. Noted to be on Plavix but not on Coumadin or other anticoagulants for anticoagulation, ? Due to fall risk. Episode of RVR pm 8/11 Hx of HTN Hypotension   P:  - continue hydration, will try to meet GI losses (~900 cc/day) - continue amiodarone, change to PO  8/13 - monitor vital signs - maintain MAP of 65 - holding home BP regimen, no indication for dilt or b-blockade at present  RENAL A:  Acute renal failure secondary to volume depletion with metabolic acidosis. No lactic acidosis-  Baseline Scr 0.62 in 2010 Hyponatremia/hypochloremia - resolved Hypokalemia Hypocalcemia  P:   - continue NS at 100/h - continue bicarb drip and follow BMP, renal recovery, ABG - monitor UOP, renal fxn - continue to replace K+ and repeat mag 1g x 1 dose on 8/13 - monitor BMet q6h, mag in AM - repeat ABG today  GASTROINTESTINAL A:   Severe diarrhea? Cause, C. Diff neg, UC ng > suspect viral gastroenteritis Clear liquid diet P:   - dc Flagyl as C.diff NEG - PPI for SUP - advance diet to Full Liquid  HEMATOLOGIC A: Anemia - ? dilutional P:  - monitor for bleeding - SCDs for DVT prophylaxis  INFECTIOUS A:  ? Infectious cause of diarrhea, C. Diff NEG, UC ng Immunocompromised secondary to methotrexate use Thrush ? GPC bacteremia, ? contaminant P:   - dc Flagyl - vanco IV started for 1 of 2 GPC in blood cx - flucaonzole for thrush x 3 days - monitor fever curves  ENDOCRINE A:  At risk for relative adrenal insufficiency due to chronic prednisone therapy for rheumatoid arthritis   P:   - stop stress dose hydrocortisone  - restart her home pred 5mg  qd - monitor CBGs - SSI  NEUROLOGIC A:   Chronic anxiety Acute altered mental status  P:   - monitor mental status changes - qhs ativan restarted 8/12 at 1/2 dose - determine baseline from family members  MUSCULOSKELETAL A: Rheumatoid arthritis on methotrexate 12.5 mg every Saturday and prednisone followed in [facility] . Currently Rt elbow in 90 flexion; says is chronic P: - back to pred 5 qd - Continue to hold methotrexate (took it 06/06/13)  Today's summary: contine vanc/diflucan, dc flagyl, monitor labs, replace electrolytes, IV bolus to maintain hemodynamics at goal MAP  of 65  Plan to SDU 8/13  I have personally obtained a history, examined the patient, evaluated laboratory and imaging results, formulated the assessment and plan and placed orders.  Levy Pupa, MD, PhD 06/17/2013, 10:59 AM [facility] [phone] or if no answer [phone]

## 2013-06-17 NOTE — Progress Notes (Signed)
ANTIBIOTIC CONSULT NOTE - INITIAL  Pharmacy Consult for Vancomycin Indication: bacteremia  Allergies  Allergen Reactions  . Peanut-Containing Drug Products Anaphylaxis  . Reglan [Metoclopramide] Other (See Comments)    "Messed my mind up"  . Codeine Nausea Only  . Iohexol Swelling     Desc: pt received hypaque 60% in 1994 and had erythema, hives & lips swell.  She did ok w/o premeds in 8/05 at [facility].  Pt. takes daily prednisone for rheumatoid arthritis.  Poor venous access today (7/7/6) so we did her w/o iv contrast.   . Shellfish Allergy Swelling    Lips swell    Patient Measurements: Height: 5\' 4"  (162.6 cm) Weight: 158 lb 15.2 oz (72.1 kg) IBW/kg (Calculated) : 54.7  Vital Signs: Temp: 98.2 F (36.8 C) (08/13 0000) Temp src: Core (Comment) (08/12 2000) BP: 106/62 mmHg (08/13 0000) Pulse Rate: 51 (08/13 0000) Intake/Output from previous day: 08/12 0701 - 08/13 0700 In: 3917.2 [P.O.:150; I.V.:3467.2; IV Piggyback:300] Out: 1595 [Urine:1145; Stool:450] Intake/Output from this shift: Total I/O In: 1016.8 [I.V.:866.8; IV Piggyback:150] Out: 150 [Urine:150]  Labs:  Recent Labs  06/15/13 1243 06/15/13 1625  06/16/13 0400  06/16/13 1711 06/16/13 2023 06/17/13 0027  WBC 10.7*  --   --  6.7  --   --   --   --   HGB 15.6* 15.0  --  12.1  --   --   --   --   PLT 206  --   --  194  --   --   --   --   CREATININE 8.54* 8.60*  < > 5.76*  < > 4.02* 3.69* 3.31*  < > = values in this interval not displayed. Estimated Creatinine Clearance: 14.5 ml/min (by C-G formula based on Cr of 3.31). No results found for this basename: VANCOTROUGH, Leodis Binet, VANCORANDOM, GENTTROUGH, GENTPEAK, GENTRANDOM, TOBRATROUGH, TOBRAPEAK, TOBRARND, AMIKACINPEAK, AMIKACINTROU, AMIKACIN,  in the last 72 hours   Microbiology: Recent Results (from the past 720 hour(s))  CULTURE, BLOOD (ROUTINE X 2)     Status: None   Collection Time    06/15/13  6:45 PM      Result Value Range Status   Specimen  Description BLOOD HAND RIGHT   Final   Special Requests BOTTLES DRAWN AEROBIC AND ANAEROBIC 10CC   Final   Culture  Setup Time     Final   Value: 06/16/2013 03:32     Performed at [facility]   Culture     Final   Value: GRAM POSITIVE COCCI IN CLUSTERS     Note: Gram Stain Report Called to,Read Back By and Verified With: PUJAH PATEL ON 06/17/2013 AT 1:13A BY WILEJ     Performed at [facility]   Report Status PENDING   Incomplete  MRSA PCR SCREENING     Status: None   Collection Time    06/15/13  8:00 PM      Result Value Range Status   MRSA by PCR NEGATIVE  NEGATIVE Final   Comment:            The GeneXpert MRSA Assay (FDA     approved for NASAL specimens     only), is one component of a     comprehensive MRSA colonization     surveillance program. It is not     intended to diagnose MRSA     infection nor to guide or     monitor treatment for     MRSA infections.  URINE CULTURE     Status: None   Collection Time    06/15/13 10:12 PM      Result Value Range Status   Specimen Description URINE, CATHETERIZED   Final   Special Requests NONE   Final   Culture  Setup Time     Final   Value: 06/15/2013 22:25     Performed at [facility] Count     Final   Value: NO GROWTH     Performed at [facility]   Culture     Final   Value: NO GROWTH     Performed at [facility]   Report Status 06/17/2013 FINAL   Final  CLOSTRIDIUM DIFFICILE BY PCR     Status: None   Collection Time    06/16/13  5:44 AM      Result Value Range Status   C difficile by pcr NEGATIVE  NEGATIVE Final    Medical History: Past Medical History  Diagnosis Date  . Cervical stenosis of spine   . Rheumatoid arthritis(714.0)   . Spondylolisthesis of lumbar region   . CAD (coronary artery disease)   . Hypertension   . A-fib     Medications:  Scheduled:  . calcium-vitamin D  1 tablet Oral TID  . citalopram  20 mg Oral Daily  . famotidine (PEPCID) IV  20  mg Intravenous Q12H  . fluconazole (DIFLUCAN) IV  50 mg Intravenous Q48H  . Gerhardt's butt cream   Topical BID  . hydrocortisone sod succinate (SOLU-CORTEF) inj  50 mg Intravenous Q8H  . insulin aspart  0-15 Units Subcutaneous TID WC  . vancomycin  500 mg Oral Q6H   And  . metronidazole  500 mg Intravenous Q8H  . pantoprazole (PROTONIX) IV  40 mg Intravenous Q24H   Assessment: 77 yo female with 1/2 blood cultures growing GPC for empiric vancomycin.  Vancomycin 1 g IV given during downtime at 0200 SCr has fallen from 8.6 at admit to 3.31 tonight.  Previous baseline SCr ~0.5  Goal of Therapy:  Vancomycin trough level 15-20 mcg/ml  Plan:  Will check vancomycin level tomorrow AM and redose accordingly. F/U renal function   Eddie Candle 06/17/2013,3:37 AM

## 2013-06-17 NOTE — Progress Notes (Signed)
CRITICAL VALUE ALERT  Critical value received:  K 2.5  Date of notification:  06/17/13  Time of notification:  0520  Critical value read back:yes  Nurse who received alert:  L Avani Sensabaugh RN  MD notified (1st page):  Elink MD  Time of first page:  0520  Responding MD:  Marchelle Gearing  Time MD responded:  [phone]

## 2013-06-18 ENCOUNTER — Inpatient Hospital Stay (HOSPITAL_COMMUNITY): Payer: Medicare Other

## 2013-06-18 LAB — GLUCOSE, CAPILLARY
Glucose-Capillary: 114 mg/dL — ABNORMAL HIGH (ref 70–99)
Glucose-Capillary: 163 mg/dL — ABNORMAL HIGH (ref 70–99)
Glucose-Capillary: 104 mg/dL — ABNORMAL HIGH (ref 70–99)

## 2013-06-18 LAB — CBC
HCT: 31.9 % — ABNORMAL LOW (ref 36.0–46.0)
MCHC: 36.1 g/dL — ABNORMAL HIGH (ref 30.0–36.0)
Platelets: 223 10*3/uL (ref 150–400)
RDW: 14.2 % (ref 11.5–15.5)

## 2013-06-18 LAB — BASIC METABOLIC PANEL
BUN: 42 mg/dL — ABNORMAL HIGH (ref 6–23)
Calcium: 8.1 mg/dL — ABNORMAL LOW (ref 8.4–10.5)
Chloride: 104 mEq/L (ref 96–112)
Creatinine, Ser: 2 mg/dL — ABNORMAL HIGH (ref 0.50–1.10)
Creatinine, Ser: 1.8 mg/dL — ABNORMAL HIGH (ref 0.50–1.10)
GFR calc Af Amer: 27 mL/min — ABNORMAL LOW (ref >90)
GFR calc Af Amer: 31 mL/min — ABNORMAL LOW (ref >90)
GFR calc non Af Amer: 23 mL/min — ABNORMAL LOW (ref >90)
Sodium: 138 mEq/L (ref 135–145)

## 2013-06-18 LAB — CULTURE, BLOOD (ROUTINE X 2)

## 2013-06-18 MED ORDER — POTASSIUM CHLORIDE CRYS ER 20 MEQ PO TBCR
30.0000 meq | EXTENDED_RELEASE_TABLET | ORAL | Status: DC
  Filled 2013-06-18 (×2): qty 1

## 2013-06-18 MED ORDER — POTASSIUM CHLORIDE 10 MEQ/100ML IV SOLN: 10.0000 meq | INTRAVENOUS | Status: DC

## 2013-06-18 MED ORDER — FLUCONAZOLE 100MG IVPB
50.0000 mg | INTRAVENOUS | Status: DC
  Filled 2013-06-18: qty 25

## 2013-06-18 MED ORDER — METOPROLOL TARTRATE 1 MG/ML IV SOLN
5.0000 mg | Freq: Once | INTRAVENOUS | Status: DC
  Filled 2013-06-18: qty 5

## 2013-06-18 MED ORDER — PREDNISONE 5 MG PO TABS
5.0000 mg | ORAL_TABLET | Freq: Every day | ORAL | Status: DC
  Administered 2013-06-18 – 2013-06-23 (×6): 5 mg via ORAL
  Filled 2013-06-18 (×6): qty 1

## 2013-06-18 MED ORDER — LOPERAMIDE HCL 2 MG PO CAPS
2.0000 mg | ORAL_CAPSULE | ORAL | Status: DC | PRN
  Administered 2013-06-18: 2 mg via ORAL
  Filled 2013-06-18: qty 1

## 2013-06-18 MED ORDER — VANCOMYCIN HCL IN DEXTROSE 1-5 GM/200ML-% IV SOLN
1000.0000 mg | INTRAVENOUS | Status: DC
  Administered 2013-06-18: 1000 mg via INTRAVENOUS
  Filled 2013-06-18 (×2): qty 200

## 2013-06-18 MED ORDER — POTASSIUM CHLORIDE CRYS ER 20 MEQ PO TBCR
40.0000 meq | EXTENDED_RELEASE_TABLET | ORAL | Status: AC
  Administered 2013-06-18 (×2): 40 meq via ORAL
  Filled 2013-06-18 (×2): qty 2

## 2013-06-18 NOTE — Progress Notes (Signed)
Pt reports she has decided she will need 1-2 wks of rehab @ SNF as spouse will not be able to assist due to his medical problems, requests placement @ [facility]. Social work consult placed.  PT eval pending.

## 2013-06-18 NOTE — Evaluation (Signed)
Physical Therapy Evaluation Patient Details Name: Angela [name] MRN: [id number] DOB: November 04, 1936 Today's Date: 06/18/2013 Time: 4098-1191 PT Time Calculation (min): 20 min  PT Assessment / Plan / Recommendation History of Present Illness  Pt presents with 1 wk of diarrhea with Cdiff negative  viral gastroenteritis with metabolic abnormalities  Clinical Impression  Pt with confusion with pleasant demeanor who demonstrates the below deficits and will benefit from acute therapy to maximize mobility, function, gait and safety prior to discharge to decrease burden of care.    PT Assessment  Patient needs continued PT services    Follow Up Recommendations  Home health PT;SNF;Supervision for mobility/OOB (pending family ability to assist)    Does the patient have the potential to tolerate intense rehabilitation      Barriers to Discharge Decreased caregiver support spouse with CA but wants to care for pt at home    Equipment Recommendations  Rolling walker with 5" wheels    Recommendations for Other Services OT consult   Frequency Min 3X/week    Precautions / Restrictions Precautions Precautions: Fall   Pertinent Vitals/Pain No pain HR inconsistent with variation from 125-154 with activity with RN monitoring throughout and K+ repletion initiated with pt asymptomatic throughout      Mobility  Bed Mobility Bed Mobility: Not assessed Transfers Transfers: Sit to Stand;Stand to Sit Sit to Stand: 4: Min assist;From chair/3-in-1;With armrests Stand to Sit: 4: Min assist;With armrests;To chair/3-in-1 Details for Transfer Assistance: cueing for hand placement, safety  Ambulation/Gait Ambulation/Gait Assistance: 4: Min assist Ambulation Distance (Feet): 65 Feet Assistive device: Rolling walker Ambulation/Gait Assistance Details: cueing for posture and position in Rw with assist to turn Gait Pattern: Step-through pattern;Decreased stride length Gait velocity: decreased Stairs: No     Exercises     PT Diagnosis: Difficulty walking;Altered mental status  PT Problem List: Decreased strength;Decreased activity tolerance;Decreased mobility;Cardiopulmonary status limiting activity;Decreased safety awareness PT Treatment Interventions: DME instruction;Gait training;Functional mobility training;Therapeutic activities;Therapeutic exercise;Patient/family education     PT Goals(Current goals can be found in the care plan section) Acute Rehab PT Goals Patient Stated Goal: return home PT Goal Formulation: With patient Time For Goal Achievement: 07/02/13 Potential to Achieve Goals: Good  Visit Information  Last PT Received On: 06/18/13 Assistance Needed: +1 History of Present Illness: Pt presents with 1 wk of diarrhea with Cdiff negative  viral gastroenteritis with metabolic abnormalities       Prior Functioning  Home Living Family/patient expects to be discharged to:: Private residence Living Arrangements: Spouse/significant other Available Help at Discharge: Family;Available 24 hours/day Type of Home: House Home Access: Elevator (uncertain how accurate considering 1 level home) Home Layout: One level Home Equipment: Walker - 2 wheels;Bedside commode;Wheelchair - manual Prior Function Level of Independence: Independent Comments: unclear how accurate PLOF is given pt AMS, no family present to confirm Communication Communication: No difficulties    Cognition  Cognition Arousal/Alertness: Awake/alert Behavior During Therapy: WFL for tasks assessed/performed Overall Cognitive Status: Impaired/Different from baseline Area of Impairment: Safety/judgement;Problem solving Memory: Decreased short-term memory Safety/Judgement: Decreased awareness of safety;Decreased awareness of deficits Problem Solving: Slow processing General Comments: Pt with disjointed thoughts at times, confabulatory     Extremity/Trunk Assessment Upper Extremity Assessment Upper Extremity  Assessment: Generalized weakness Lower Extremity Assessment Lower Extremity Assessment: Generalized weakness Cervical / Trunk Assessment Cervical / Trunk Assessment: Normal   Balance    End of Session PT - End of Session Equipment Utilized During Treatment: Gait belt Activity Tolerance: Patient tolerated treatment well Patient left:  in chair;with call bell/phone within reach;with nursing/sitter in room Nurse Communication: Mobility status  GP     Delorse Lek 06/18/2013, 2:08 PM Delaney Meigs, PT [phone]

## 2013-06-18 NOTE — Progress Notes (Signed)
PULMONARY  / CRITICAL CARE MEDICINE  Name: Angela [name] MRN: [id number] DOB: [birth year]   PCP Lillia Mountain, MD Rheum: DR Zenovia Jordan  ADMISSION DATE:  06/15/2013 CONSULTATION DATE:  06/15/13  REFERRING MD :  Dr Criss Alvine of [place]  PRIMARY SERVICE: PCCM  CHIEF COMPLAINT:  Diarreha followed by metabolic abnormalities  BRIEF HISTORY: 77 year old female with chronic pain followed by pain clinic and rheumatoid arthritis on methotrexate and prednisone presents to [facility] ED on 06/15/13 with complaints of explosive diarrhea x 1 week.   SIGNIFICANT EVENTS / STUDIES:  06/15/13 - admission  LINES / TUBES:  CULTURES: 8/11 BCx2>>> 8/11 UC>>>neg 8/12 C.diff>>>NEG  ANTIBIOTICS: PO vanc 06/15/13>>> 8/13 IV Flagyl 06/15/13 (empiric C diff)>>>8/13 IV Diflucan 06/15/13 (oral thrush)>>>8/14 IV vanco 8/13 (GPC blood cx) >>   SUBJECTIVE:  100% on RA, pleasantly confused.  Still having diarrhea  VITAL SIGNS: Temp:  [97.8 F (36.6 C)-98.7 F (37.1 C)] 97.8 F (36.6 C) (08/14 0716) Pulse Rate:  [72-101] 101 (08/13 0900) Resp:  [15-26] 24 (08/14 0700) BP: (104-125)/(42-93) 119/72 mmHg (08/14 0600) SpO2:  [96 %-100 %] 100 % (08/14 0700) Weight:  [159 lb 6.3 oz (72.3 kg)] 159 lb 6.3 oz (72.3 kg) (08/14 0400) HEMODYNAMICS:   VENTILATOR SETTINGS:   INTAKE / OUTPUT: Intake/Output     08/13 0701 - 08/14 0700 08/14 0701 - 08/15 0700   P.O. 480    I.V. (mL/kg) 2170.5 (30)    IV Piggyback 500    Total Intake(mL/kg) 3150.5 (43.6)    Urine (mL/kg/hr) 1515 (0.9)    Stool     Total Output 1515     Net +1635.5            PHYSICAL EXAMINATION: General:   frail elderly female, confused in NAD Neuro:   alert and oriented x3. Pleasantly confused, speech is normal,  Moves all 4 extremities. HEENT:  PERRL, dry MM  Cardiovascular:  Irregularly irregular Lungs:   Clear to auscultation bilaterally, no retractions Abdomen:   soft, nontender, no organomegaly. Normal bowel sounds no rebound. No  guarding  Musculoskeletal:  obvious deformities of rheumatoid arthritis present critical in the fingers and toes but RT Elbow is flexed 90 degress, ? Chronic, strength 3/5 Skin: intact Ext: slight edema in LLE  LABS:  CBC Recent Labs     06/16/13  0400  06/17/13  0350  06/18/13  0445  WBC  6.7  5.9  7.9  HGB  12.1  11.1*  11.5*  HCT  31.5*  29.1*  31.9*  PLT  194  205  223   Coag's No results found for this basename: APTT, INR,  in the last 72 hours BMET Recent Labs     06/17/13  1030  06/17/13  1600  06/18/13  0445  NA  134*  134*  137  K  3.2*  3.7  3.0*  CL  97  97  102  CO2  23  24  26   BUN  49*  46*  42*  CREATININE  2.53*  2.23*  2.00*  GLUCOSE  200*  146*  119*   Electrolytes Recent Labs     06/17/13  0350  06/17/13  1030  06/17/13  1600  06/18/13  0445  CALCIUM  7.4*  7.5*  8.0*  8.1*  MG  1.9   --    --   2.1   Sepsis Markers Recent Labs     06/15/13  2130  06/16/13  0400  06/17/13  0350  PROCALCITON  17.58  16.68  5.74   ABG Recent Labs     06/15/13  2134  06/16/13  1103  06/17/13  1128  PHART  7.329*  7.438  7.613*  PCO2ART  18.8*  22.7*  24.3*  PO2ART  91.6  83.0  132.0*   Liver Enzymes Recent Labs     06/15/13  1243  AST  19  ALT  17  ALKPHOS  70  BILITOT  0.5  ALBUMIN  2.7*   Cardiac Enzymes No results found for this basename: TROPONINI, PROBNP,  in the last 72 hours Glucose Recent Labs     06/16/13  1158  06/16/13  1713  06/16/13  2205  06/17/13  0941  06/17/13  1140  06/17/13  2152  GLUCAP  124*  164*  136*  179*  170*  121*     CXR: 8/14 - R lung clear, stable L basilar opacity consistent with atx or pna with pleural effusion     ASSESSMENT / PLAN:  PULMONARY A: Respiratory alkalosis P:   - monitor O2 sats with goal of 90-92% on RA  CARDIOVASCULAR A:  Chronic A. fib. Noted to be on Plavix but not on Coumadin or other anticoagulants for anticoagulation, ? Due to fall risk. Episode of RVR pm 8/11 Hx of  HTN Hypotension - resolved P:  - continue hydration, will try to meet GI losses (~900 cc/day) - continue amiodarone PO, consider d/c - unclear whether this needs to be a chronic medication - maintain MAP of 65 - no indication for dilt or b-blockade at present  RENAL A:  Acute renal failure secondary to volume depletion with metabolic acidosis. No lactic acidosis-  Baseline Scr 0.62 in 2010 Hyponatremia/hypochloremia - resolved Hypokalemia Hypocalcemia  P:   - continue NS at 40/h - monitor UOP, renal fxn - continue to replace K+ - monitor BMet q12h  GASTROINTESTINAL A:   Severe diarrhea? Cause, C. Diff neg, UC ng > suspect viral gastroenteritis Carb modified P:   - PPI for SUP - add imodium today 8/14 - diet as tolerated  HEMATOLOGIC A: Anemia - ? dilutional P:  - monitor for bleeding - SCDs for DVT prophylaxis - CBC in AM  INFECTIOUS A:  ? Infectious cause of diarrhea, C. Diff NEG, UC ng Immunocompromised secondary to methotrexate use Thrush ? GPC bacteremia, ? contaminant P:   - vanco IV started for 1 of 2 GPC in blood cx - monitor fever curves  ENDOCRINE A:  At risk for relative adrenal insufficiency due to chronic prednisone therapy for rheumatoid arthritis   P:   - continue home pred 5mg  qd - monitor CBGs - SSI  NEUROLOGIC A:   Chronic anxiety Acute altered mental status  P:   - monitor mental status changes - qhs ativan restarted 8/12 at 1/2 dose  MUSCULOSKELETAL A: Rheumatoid arthritis on methotrexate 12.5 mg every Saturday and prednisone followed in [facility] . Currently Rt elbow in 90 flexion; says is chronic P: - continue pred 5 qd - Continue to hold methotrexate (took it 06/06/13)  Today's summary: contine vanc, dc diflucan, monitor labs, replace electrolytes, continue IV fluids, goal MAP of 65  Plan to tele 8/14  I have personally obtained a history, examined the patient, evaluated laboratory and imaging results,  formulated the assessment and plan and placed orders.  Levy Pupa, MD, PhD 06/18/2013, 7:25 AM [facility] [phone] or if no answer [phone]

## 2013-06-18 NOTE — Progress Notes (Signed)
eLink Nursing ICU Electrolyte Replacement Protocol  Patient Name: Angela [name] DOB: Oct 13, 1936 MRN: [id number]  Date of Service  06/18/2013   HPI/Events of Note    Recent Labs Lab 06/17/13 0027 06/17/13 0350 06/17/13 1030 06/17/13 1600 06/18/13 0445  NA 134* 135 134* 134* 137  K 2.8* 2.5* 3.2* 3.7 3.0*  CL 98 100 97 97 102  CO2 22 23 23 24 26   GLUCOSE 166* 198* 200* 146* 119*  BUN 62* 59* 49* 46* 42*  CREATININE 3.31* 3.06* 2.53* 2.23* 2.00*  CALCIUM 7.2* 7.4* 7.5* 8.0* 8.1*  MG  --  1.9  --   --  2.1    Estimated Creatinine Clearance: 24 ml/min (by C-G formula based on Cr of 2).  Intake/Output     08/13 0701 - 08/14 0700   P.O. 480   I.V. (mL/kg) 2130.5 (29.5)   IV Piggyback 500   Total Intake(mL/kg) 3110.5 (43)   Urine (mL/kg/hr) 1475 (0.9)   Total Output 1475   Net +1635.5        - I/O DETAILED x24h    Total I/O In: 720 [P.O.:240; I.V.:430; IV Piggyback:50] Out: 625 [Urine:625] - I/O THIS SHIFT    ASSESSMENT   eICURN Interventions  K+ 3.0 Critera for protocol not met.  K+ replaced per MD order   ASSESSMENT: MAJOR ELECTROLYTE    Merita Norton 06/18/2013, 6:32 AM

## 2013-06-18 NOTE — Progress Notes (Addendum)
ANTIBIOTIC CONSULT NOTE - FOLLOW UP  Pharmacy Consult for Vancomycin/Fluconazole Indication: bacteremia/thrush  Allergies  Allergen Reactions  . Peanut-Containing Drug Products Anaphylaxis  . Reglan [Metoclopramide] Other (See Comments)    "Messed my mind up"  . Codeine Nausea Only  . Iohexol Swelling     Desc: pt received hypaque 60% in 1994 and had erythema, hives & lips swell.  She did ok w/o premeds in 8/05 at [facility].  Pt. takes daily prednisone for rheumatoid arthritis.  Poor venous access today (7/7/6) so we did her w/o iv contrast.   . Shellfish Allergy Swelling    Lips swell    Patient Measurements: Height: 5\' 4"  (162.6 cm) Weight: 159 lb 6.3 oz (72.3 kg) IBW/kg (Calculated) : 54.7  Vital Signs: Temp: 97.8 F (36.6 C) (08/14 0716) Temp src: Oral (08/14 0716) BP: 119/72 mmHg (08/14 0600) Intake/Output from previous day: 08/13 0701 - 08/14 0700 In: 3150.5 [P.O.:480; I.V.:2170.5; IV Piggyback:500] Out: 1515 [Urine:1515] Intake/Output from this shift:    Labs:  Recent Labs  06/16/13 0400  06/17/13 0350 06/17/13 1030 06/17/13 1600 06/18/13 0445  WBC 6.7  --  5.9  --   --  7.9  HGB 12.1  --  11.1*  --   --  11.5*  PLT 194  --  205  --   --  223  CREATININE 5.76*  < > 3.06* 2.53* 2.23* 2.00*  < > = values in this interval not displayed. Estimated Creatinine Clearance: 24 ml/min (by C-G formula based on Cr of 2).  Recent Labs  06/17/13 0545 06/18/13 0445  VANCORANDOM 19.3 8.3    Assessment: 76yof continues on day #2 vancomycin for GPC bacteremia and day#4 fluconazole for thrush.  Patient was admitted in ARF with sCr 8.54. Renal function is improving and sCr down to 2.0 today (baseline 0.5). 1g of vancomycin was given 8/13 @0150 . A peak level of 19.3 was drawn. No further vancomycin was given and today's random level is below goal at 8.3. Using first dose kinetics, the patient's new ke=0.06 and vancomycin half life is ~18 hours. Anticipate renal function to  continue to improve.  Flagyl 8/11>>8/13 Vanc PO 8/11>>8/13 Flucon IV 8/11>> Vanc IV 8/13 >>  8/12 - C diff - neg 8/11 - MRSA screen neg 8/11 - urine - neg 8/11 - blood x 2 - #1/2 GPC-clusters   Goal of Therapy:  Vancomycin trough level 15-20 mcg/ml Appropriate fluconazole dosing  Plan:  1) Vancomycin 1g IV q24 2) Change fluconazole to 50mg  IV q24 3) Continue to follow renal function and adjust doses as necessary 4) Follow up cultures  Angela [name] 06/18/2013,8:34 AM

## 2013-06-19 LAB — GLUCOSE, CAPILLARY
Glucose-Capillary: 102 mg/dL — ABNORMAL HIGH (ref 70–99)
Glucose-Capillary: 129 mg/dL — ABNORMAL HIGH (ref 70–99)

## 2013-06-19 LAB — CBC
MCH: 32 pg (ref 26.0–34.0)
MCHC: 34.5 g/dL (ref 30.0–36.0)
Platelets: 244 10*3/uL (ref 150–400)
RBC: 3.88 MIL/uL (ref 3.87–5.11)

## 2013-06-19 LAB — BASIC METABOLIC PANEL
CO2: 26 mEq/L (ref 19–32)
Calcium: 8.3 mg/dL — ABNORMAL LOW (ref 8.4–10.5)
GFR calc non Af Amer: 32 mL/min — ABNORMAL LOW (ref >90)
Potassium: 3.9 mEq/L (ref 3.5–5.1)
Sodium: 141 mEq/L (ref 135–145)

## 2013-06-19 MED ORDER — METOPROLOL TARTRATE 25 MG PO TABS
25.0000 mg | ORAL_TABLET | Freq: Two times a day (BID) | ORAL | Status: DC
  Administered 2013-06-19: 25 mg via ORAL
  Filled 2013-06-19 (×4): qty 1

## 2013-06-19 MED ORDER — ENOXAPARIN SODIUM 30 MG/0.3ML ~~LOC~~ SOLN
30.0000 mg | SUBCUTANEOUS | Status: DC
  Administered 2013-06-19 – 2013-06-22 (×4): 30 mg via SUBCUTANEOUS
  Filled 2013-06-19 (×5): qty 0.3

## 2013-06-19 MED ORDER — CLOPIDOGREL BISULFATE 75 MG PO TABS
75.0000 mg | ORAL_TABLET | Freq: Every day | ORAL | Status: DC
  Administered 2013-06-20 – 2013-06-23 (×4): 75 mg via ORAL
  Filled 2013-06-19 (×5): qty 1

## 2013-06-19 MED ORDER — PANTOPRAZOLE SODIUM 40 MG PO TBEC
40.0000 mg | DELAYED_RELEASE_TABLET | Freq: Every day | ORAL | Status: DC
  Administered 2013-06-19 – 2013-06-23 (×5): 40 mg via ORAL
  Filled 2013-06-19 (×5): qty 1

## 2013-06-19 NOTE — Progress Notes (Addendum)
Subjective: Diarrhea improving, confused, better appetite  Objective: Vital signs in last 24 hours: Temp:  [97.4 F (36.3 C)-98.2 F (36.8 C)] 97.4 F (36.3 C) (08/15 0456) Pulse Rate:  [86-145] 102 (08/15 0456) Resp:  [16-28] 18 (08/15 0456) BP: (105-125)/(52-106) 116/62 mmHg (08/15 0456) SpO2:  [97 %-100 %] 97 % (08/15 0456) Weight:  [79.4 kg (175 lb 0.7 oz)-79.8 kg (175 lb 14.8 oz)] 79.8 kg (175 lb 14.8 oz) (08/15 0456) Weight change: 7.1 kg (15 lb 10.4 oz) Last BM Date: 06/18/13  Intake/Output from previous day: 08/14 0701 - 08/15 0700 In: 1590 [P.O.:590; I.V.:800; IV Piggyback:200] Out: 830 [Urine:505; Stool:325] Intake/Output this shift:    General appearance: alert Resp: clear to auscultation bilaterally Cardio: irregularly irregular rhythm GI: soft, non-tender; bowel sounds normal; no masses,  no organomegaly Extremities: extremities normal, atraumatic, no cyanosis or edema  Lab Results:  Recent Labs  06/18/13 0445 06/19/13 0610  WBC 7.9 11.9*  HGB 11.5* 12.4  HCT 31.9* 35.9*  PLT 223 244   BMET  Recent Labs  06/18/13 0445 06/18/13 2100  NA 137 138  K 3.0* 3.7  CL 102 104  CO2 26 25  GLUCOSE 119* 139*  BUN 42* 39*  CREATININE 2.00* 1.80*  CALCIUM 8.1* 8.5    Studies/Results: Dg Chest Port 1 View  06/18/2013   *RADIOLOGY REPORT*  Clinical Data: Assess infiltrates  PORTABLE CHEST - 1 VIEW  Comparison: June 17, 2013.  Findings: Stable cardiomediastinal silhouette.  Right lung is clear.  Stable left basilar opacity consistent with atelectasis or pneumonia with associated pleural effusion.  Severe degenerative change of left shoulder joint is noted with possible rotator cuff injury.  IMPRESSION: Stable left basilar opacity as described above.   Original Report Authenticated By: Lupita Raider.,  M.D.    Medications: I have reviewed the patient's current medications.  Assessment/Plan: ASSESSMENT / PLAN:  PULMONARY  A: Respiratory alkalosis  P:   - monitor O2 sats with goal of 90-92% on RA   CARDIOVASCULAR  A:New onset A. fib. Noted to be on Plavix,. Episode of RVR pm 8/11. Rate still running over 110 Hx of HTN  Hypotension - resolved  P:  - continue hydration  - discontinue amiodarone PO, -start beta blocker for rate control - if does not convert consider anticoagulation  RENAL  A: Acute renal failure secondary to volume depletion with metabolic acidosis. No lactic acidosis- Baseline Scr 0.62 in 2010. Creatinine improving Hyponatremia/hypochloremia - resolved  Hypokalemia  Hypocalcemia  P:  - continue NS at 40/h  - monitor UOP, renal fxn  - continue to replace K+ prn  GASTROINTESTINAL  A: Severe diarrhea? Cause, C. Diff neg, UC ng > suspect viral gastroenteritis  Carb modified diet.  Diarrhea resolving P:  - PPI for GERD -  imodium today 8/14  - diet as tolerated   INFECTIOUS  A: ? Infectious cause of diarrhea, C. Diff NEG, UC ng likely viral Immunocompromised secondary to methotrexate use  Thrush  Blood cultures 1/2 staph coag negative, likely contaminant P:   - D/C vancomycin   ENDOCRINE  A: At risk for relative adrenal insufficiency due to chronic prednisone therapy for rheumatoid arthritis  P:  - continue home pred 5mg  qd  - SSI for now, not diabetic, possibly stop in am   NEUROLOGIC  A: Chronic anxiety  Acute altered mental status, still confused, thought she was at home.  P:  - monitor mental status changes  - qhs ativan restarted 8/12 at  1/2 dose,  -stop celexa, this was D/Ced about a year ago  MUSCULOSKELETAL  A: Rheumatoid arthritis on methotrexate 12.5 mg every Saturday and prednisone followed in [facility] . Currently Rt elbow in 90 flexion; says is chronic  P:  - continue pred 5 qd  - Continue to hold methotrexate (took it 06/06/13)    DVT Prophylaxis.  Start lovenox.  Disposition  NHP for rehab planned.     LOS: 4 days   Lyndi Holbein JOSEPH 06/19/2013, 7:19  AM

## 2013-06-19 NOTE — Plan of Care (Signed)
Problem: Phase II Progression Outcomes Goal: Discharge plan established Outcome: Completed/Met Date Met:  06/19/13 Per report, pt. To be discharged to SNF for rehab.

## 2013-06-19 NOTE — Progress Notes (Signed)
Report given to receiving RN. Patient is stable. No signs of distress or discomfort. 

## 2013-06-19 NOTE — Progress Notes (Addendum)
Patient's BP is 95/65 and is due for metoprolol. Pt's SBP trend has been in the 90-100 range today. 1000 scheduled metoprolol was held due to BP being 94/54. Patient is asymptomatic. No signs of distress or discomfort. No complaints of feeling dizzy. MD paged and receiving RN is made aware.

## 2013-06-19 NOTE — Progress Notes (Signed)
Co-signed for Kimberly Lutterloh RN assessments, IV assessments, I's and O's, care plans, patient education, progress notes, Vital signs, and medications administration. Trenita Hulme M, RN/BSN 

## 2013-06-19 NOTE — Progress Notes (Signed)
MD notified and is aware of BP being 95/65. MD stated that it is ok to hold scheduled dose of metoprolol and monitor patient throughout the night. Will make receiving RN aware.

## 2013-06-20 LAB — BASIC METABOLIC PANEL
CO2: 22 mEq/L (ref 19–32)
Calcium: 8.5 mg/dL (ref 8.4–10.5)
GFR calc non Af Amer: 39 mL/min — ABNORMAL LOW (ref >90)
Sodium: 137 mEq/L (ref 135–145)

## 2013-06-20 LAB — CBC
Platelets: 260 10*3/uL (ref 150–400)
RBC: 4.15 MIL/uL (ref 3.87–5.11)
WBC: 16.1 10*3/uL — ABNORMAL HIGH (ref 4.0–10.5)

## 2013-06-20 LAB — GLUCOSE, CAPILLARY

## 2013-06-20 MED ORDER — METOPROLOL TARTRATE 50 MG PO TABS
50.0000 mg | ORAL_TABLET | Freq: Two times a day (BID) | ORAL | Status: DC
  Administered 2013-06-20 (×2): 50 mg via ORAL
  Filled 2013-06-20 (×3): qty 1

## 2013-06-20 NOTE — Progress Notes (Signed)
Clinical Social Work Department BRIEF PSYCHOSOCIAL ASSESSMENT 06/20/2013  Patient:  Angela [name], Angela [name]     Account Number:  [id number]     Admit date:  06/15/2013  Clinical Social Worker:  Doree Albee  Date/Time:  06/20/2013 01:19 PM  Referred by:  CSW  Date Referred:  06/20/2013 Referred for  SNF Placement   Other Referral:   Interview type:  Patient Other interview type:   patient daughter and husband    PSYCHOSOCIAL DATA Living Status:  FAMILY Admitted from facility:   Level of care:   Primary support name:  Paulla Fore Primary support relationship to patient:  SPOUSE Degree of support available:   and daughter    CURRENT CONCERNS Current Concerns  Post-Acute Placement   Other Concerns:    SOCIAL WORK ASSESSMENT / PLAN CSW met with pt and pt family to complete psychosocial assessment. Pt shared tht she is interested in going to [facility] that is near family. Pt plans to return home with home health with pt husband once she has completed short term rehab at snf. Pt family interested in a [place] snf search but prefers [facility]. CSW will initiate snf search.   Assessment/plan status:  Psychosocial Support/Ongoing Assessment of Needs Other assessment/ plan:   Information/referral to community resources:   skilled nursing facility list    PATIENT'S/FAMILY'S RESPONSE TO PLAN OF CARE: Patient and patient family thanked csw for concern and suport. Patient hopeful to dischare to [facility] when medically stable but open to other facilities.     Catha Gosselin, LCSW [phone]  ED CSW .06/20/2013 1330pm

## 2013-06-20 NOTE — Progress Notes (Signed)
Pt O3x up in chair. No complaints of SOB or CP . Will continue to monitor  Pt

## 2013-06-20 NOTE — Progress Notes (Signed)
Clinical Social Work Department CLINICAL SOCIAL WORK PLACEMENT NOTE 06/20/2013  Patient:  Angela, [name]  Account Number:  [id number] Admit date:  06/15/2013  Clinical Social Worker:  Doree Albee  Date/time:  06/20/2013 01:38 PM  Clinical Social Work is seeking post-discharge placement for this patient at the following level of care:   SKILLED NURSING   (*CSW will update this form in Epic as items are completed)   06/20/2013  Patient/family provided with [facility] Department of Clinical Social Work's list of facilities offering this level of care within the geographic area requested by the patient (or if unable, by the patient's family).  06/20/2013  Patient/family informed of their freedom to choose among providers that offer the needed level of care, that participate in Medicare, Medicaid or managed care program needed by the patient, have an available bed and are willing to accept the patient.  06/20/2013  Patient/family informed of [facility]' ownership interest in [facility], as well as of the fact that they are under no obligation to receive care at this facility.  PASARR submitted to EDS on 06/20/2013 PASARR number received from EDS on 06/20/2013  FL2 transmitted to all facilities in geographic area requested by pt/family on  06/20/2013 FL2 transmitted to all facilities within larger geographic area on   Patient informed that his/her managed care company has contracts with or will negotiate with  certain facilities, including the following:     Patient/family informed of bed offers received:   Patient chooses bed at  Physician recommends and patient chooses bed at    Patient to be transferred to  on   Patient to be transferred to facility by   The following physician request were entered in Epic:   Additional Comments:

## 2013-06-20 NOTE — Progress Notes (Signed)
Subjective: Very little diarrhea, feels better  Objective: Vital signs in last 24 hours: Temp:  [98 F (36.7 C)-98.6 F (37 C)] 98.6 F (37 C) (08/16 0521) Pulse Rate:  [73-134] 73 (08/16 0521) Resp:  [18] 18 (08/16 0521) BP: (94-133)/(54-83) 132/83 mmHg (08/16 0521) SpO2:  [93 %-97 %] 93 % (08/16 0521) Weight:  [79.47 kg (175 lb 3.2 oz)] 79.47 kg (175 lb 3.2 oz) (08/16 0521) Weight change: 0.07 kg (2.5 oz) Last BM Date: 06/19/13  Intake/Output from previous day: 08/15 0701 - 08/16 0700 In: 2562 [P.O.:1482; I.V.:1080] Out: 927 [Urine:925; Stool:2] Intake/Output this shift:    General appearance: alert and cooperative Resp: clear to auscultation bilaterally Cardio: irregularly irregular rhythm Extremities: extremities normal, atraumatic, no cyanosis or edema  Lab Results:  Recent Labs  06/19/13 0610 06/20/13 0800  WBC 11.9* 16.1*  HGB 12.4 13.7  HCT 35.9* 38.1  PLT 244 260   BMET  Recent Labs  06/18/13 2100 06/19/13 0610  NA 138 141  K 3.7 3.9  CL 104 107  CO2 25 26  GLUCOSE 139* 89  BUN 39* 36*  CREATININE 1.80* 1.54*  CALCIUM 8.5 8.3*    Studies/Results: No results found.  Medications: I have reviewed the patient's current medications.  Assessment/Plan: ASSESSMENT / PLAN:    CARDIOVASCULAR  A:New onset A. fib. Noted to be on Plavix,. Episode of RVR pm 8/11. Rate still running over 110  Hx of HTN  Hypotension - resolved  P:  - continue hydration  - discontinue amiodarone PO,  -start beta blocker for rate control  - if does not convert consider anticoagulation   RENAL  A: Acute renal failure secondary to volume depletion with metabolic acidosis. No lactic acidosis- Baseline Scr 0.62 in 2010. Creatinine improving  Hyponatremia/hypochloremia - resolved  Hypokalemia  Hypocalcemia  P:  - continue NS at 40/h  - monitor UOP, renal fxn  - continue to replace K+ prn   GASTROINTESTINAL  A: Severe diarrhea? Cause, C. Diff neg, UC ng > suspect  viral gastroenteritis  Carb modified diet. Diarrhea resolving  P:  - PPI for GERD  - imodium prn   INFECTIOUS  A: ? Infectious cause of diarrhea, C. Diff NEG, UC ng likely viral  Immunocompromised secondary to methotrexate use  Thrush  Blood cultures 1/2 staph coag negative, likely contaminant  P:  - D/Ced vancomycin.  WBC rising, unclear why, follow  ENDOCRINE  A: At risk for relative adrenal insufficiency due to chronic prednisone therapy for rheumatoid arthritis  P:  - continue home pred 5mg  qd  - D/C SSI  NEUROLOGIC  A: Chronic anxiety  Acute altered mental status, confusion improved.  P:  - monitor mental status changes  - qhs ativan restarted 8/12 at 1/2 dose,  -stop celexa, this was D/Ced about a year ago   MUSCULOSKELETAL  A: Rheumatoid arthritis on methotrexate 12.5 mg every Saturday and prednisone followed in [facility] . Currently Rt elbow in 90 flexion; says is chronic  P:  - continue pred 5 qd  - Continue to hold methotrexate (took it 06/06/13)   DVT Prophylaxis. Started lovenox.   Disposition NHP for rehab planned.  LOS: 4 days  Angela [name]  06/19/2013, 7:19 AM  Revision History...      Date/Time User Action    06/19/2013 8:16 AM Lillia Mountain, MD Addend    06/19/2013 8:09 AM Jonny Ruiz Dala Dock, MD Incomplete Revision    06/19/2013 7:35 AM Lillia Mountain, MD Sign  View Details Report        LOS: 5 days   Angela [name] 06/20/2013, 8:55 AM

## 2013-06-21 ENCOUNTER — Encounter (HOSPITAL_COMMUNITY): Payer: Self-pay | Admitting: *Deleted

## 2013-06-21 LAB — CBC
HCT: 34.3 % — ABNORMAL LOW (ref 36.0–46.0)
MCHC: 34.1 g/dL (ref 30.0–36.0)
MCV: 94.2 fL (ref 78.0–100.0)
RDW: 14.8 % (ref 11.5–15.5)

## 2013-06-21 LAB — BASIC METABOLIC PANEL
BUN: 22 mg/dL (ref 6–23)
Creatinine, Ser: 1.15 mg/dL — ABNORMAL HIGH (ref 0.50–1.10)
GFR calc Af Amer: 53 mL/min — ABNORMAL LOW (ref >90)
GFR calc non Af Amer: 46 mL/min — ABNORMAL LOW (ref >90)

## 2013-06-21 MED ORDER — POTASSIUM CHLORIDE CRYS ER 20 MEQ PO TBCR
20.0000 meq | EXTENDED_RELEASE_TABLET | Freq: Two times a day (BID) | ORAL | Status: DC
  Administered 2013-06-21 – 2013-06-23 (×5): 20 meq via ORAL
  Filled 2013-06-21 (×6): qty 1

## 2013-06-21 MED ORDER — METOPROLOL TARTRATE 25 MG PO TABS
25.0000 mg | ORAL_TABLET | Freq: Two times a day (BID) | ORAL | Status: DC
  Administered 2013-06-21 – 2013-06-22 (×3): 25 mg via ORAL
  Filled 2013-06-21 (×4): qty 1

## 2013-06-21 NOTE — Progress Notes (Signed)
Pt up in chair O3x, no complaints of pain . Will continue to monitor Pt.

## 2013-06-21 NOTE — Progress Notes (Signed)
Pt had a small BM appeared to have small amount dark n light red, hemocult done to check for blood,

## 2013-06-21 NOTE — Progress Notes (Signed)
Subjective: No diarrhea.  Objective: Vital signs in last 24 hours: Temp:  [98.1 F (36.7 C)-98.4 F (36.9 C)] 98.2 F (36.8 C) (08/17 0511) Pulse Rate:  [65-123] 72 (08/17 0511) Resp:  [18] 18 (08/17 0511) BP: (100-138)/(46-76) 138/76 mmHg (08/17 0511) SpO2:  [99 %] 99 % (08/17 0511) Weight:  [80.423 kg (177 lb 4.8 oz)] 80.423 kg (177 lb 4.8 oz) (08/17 0511) Weight change: 0.953 kg (2 lb 1.6 oz) Last BM Date: 06/19/13  Intake/Output from previous day: 08/16 0701 - 08/17 0700 In: 1920 [P.O.:960; I.V.:960] Out: 1951 [Urine:1950; Stool:1] Intake/Output this shift: Total I/O In: 152 [P.O.:120; I.V.:32] Out: -   General appearance: alert and cooperative Resp: clear to auscultation bilaterally Cardio: irregularly irregular rhythm Extremities: extremities normal, atraumatic, no cyanosis or edema  Lab Results:  Recent Labs  06/20/13 0800 06/21/13 0554  WBC 16.1* 14.5*  HGB 13.7 11.7*  HCT 38.1 34.3*  PLT 260 185   BMET  Recent Labs  06/20/13 0800 06/21/13 0554  NA 137 139  K 3.6 3.5  CL 106 108  CO2 22 24  GLUCOSE 120* 93  BUN 27* 22  CREATININE 1.30* 1.15*  CALCIUM 8.5 8.0*    Studies/Results: No results found.  Medications: I have reviewed the patient's current medications.  Assessment/Plan: CARDIOVASCULAR  A:New onset A. fib. Noted to be on Plavix,. She has converted to NSR.    Hx of HTN was on no meds as outpatient P:    -Decrease dose beta blocker, BP soft   RENAL  A: Acute renal failure secondary to volume depletion with metabolic acidosis. No lactic acidosis- Baseline Scr 0.62 in 2010. Creatinine improving  Hyponatremia/hypochloremia - resolved   P:  - discontinue NS at 40/h   - continue to replace K+ prn   GASTROINTESTINAL  A: Severe diarrhea? Cause, C. Diff neg, UC ng > suspect viral gastroenteritis  Carb modified diet. Diarrhea resolved  P:  - PPI for GERD  - imodium prn   INFECTIOUS  A: ? Infectious cause of diarrhea, C. Diff  NEG, UC ng likely viral  Immunocompromised secondary to methotrexate use  Thrush  Blood cultures 1/2 staph coag negative, likely contaminant  P:  - D/Ced vancomycin. WBC starting to fall.  ENDOCRINE  A: At risk for relative adrenal insufficiency due to chronic prednisone therapy for rheumatoid arthritis  P:  - continue home pred 5mg  qd     NEUROLOGIC  A: Chronic anxiety  Acute altered mental status, confusion resolved.  P: - qhs ativan restarted 8/12 at 1/2 dose,  -stopped celexa, this was D/Ced about a year ago   MUSCULOSKELETAL  A: Rheumatoid arthritis on methotrexate 12.5 mg every Saturday and prednisone followed in [facility] . Currently Rt elbow in 90 flexion; says is chronic  P:  - continue pred 5 qd  - Continue to hold methotrexate (took it 06/06/13)   DVT Prophylaxis. Started lovenox.  Disposition NHP for rehab planned. She is medically ready for dicharge  LOS: 4 days     LOS: 6 days   Keeyon Privitera JOSEPH 06/21/2013, 8:32 AM

## 2013-06-22 ENCOUNTER — Other Ambulatory Visit: Payer: Self-pay | Admitting: Physical Medicine & Rehabilitation

## 2013-06-22 LAB — CBC
HCT: 34.5 % — ABNORMAL LOW (ref 36.0–46.0)
MCH: 32.6 pg (ref 26.0–34.0)
MCHC: 35.1 g/dL (ref 30.0–36.0)
RDW: 15 % (ref 11.5–15.5)

## 2013-06-22 LAB — BASIC METABOLIC PANEL
BUN: 17 mg/dL (ref 6–23)
Chloride: 109 mEq/L (ref 96–112)
Creatinine, Ser: 1.04 mg/dL (ref 0.50–1.10)
GFR calc Af Amer: 60 mL/min — ABNORMAL LOW (ref >90)
GFR calc non Af Amer: 52 mL/min — ABNORMAL LOW (ref >90)

## 2013-06-22 LAB — CULTURE, BLOOD (ROUTINE X 2): Culture: NO GROWTH

## 2013-06-22 MED ORDER — ENOXAPARIN SODIUM 40 MG/0.4ML ~~LOC~~ SOLN
40.0000 mg | SUBCUTANEOUS | Status: DC
  Administered 2013-06-23: 40 mg via SUBCUTANEOUS
  Filled 2013-06-22: qty 0.4

## 2013-06-22 MED ORDER — LORAZEPAM 1 MG PO TABS: 1.0000 mg | ORAL_TABLET | Freq: Every day | ORAL | Status: DC

## 2013-06-22 MED ORDER — OXYCODONE-ACETAMINOPHEN 7.5-325 MG PO TABS: 1.0000 | ORAL_TABLET | ORAL | Status: DC | PRN

## 2013-06-22 NOTE — Progress Notes (Signed)
Physical Therapy Treatment Patient Details Name: Angela [name] MRN: [id number] DOB: [birth year] Today's Date: 06/22/2013 Time: 5784-6962 PT Time Calculation (min): 25 min  PT Assessment / Plan / Recommendation  History of Present Illness Pt presents with 1 wk of diarrhea with Cdiff negative  viral gastroenteritis with metabolic abnormalities   PT Comments   Pt very motivated.  She reports feeling very weak.  She was able to increase ambulation distance this session.    Follow Up Recommendations  Home health PT;Supervision for mobility/OOB;SNF (pending family ability to assist)     Does the patient have the potential to tolerate intense rehabilitation     Barriers to Discharge        Equipment Recommendations  Rolling walker with 5" wheels    Recommendations for Other Services    Frequency Min 3X/week   Progress towards PT Goals Progress towards PT goals: Progressing toward goals  Plan Current plan remains appropriate    Precautions / Restrictions Precautions Precautions: Fall   Pertinent Vitals/Pain No pain reported.     Mobility  Bed Mobility Bed Mobility: Supine to Sit;Sitting - Scoot to Edge of Bed Supine to Sit: 4: Min assist;HOB flat;With rails Sitting - Scoot to Edge of Bed: 5: Supervision Details for Bed Mobility Assistance: cues for use of UE's to assist with sitting upright Transfers Transfers: Sit to Stand;Stand to Sit Sit to Stand: 4: Min assist;With upper extremity assist;From bed Stand to Sit: 4: Min guard;With upper extremity assist;With armrests;To chair/3-in-1 Details for Transfer Assistance: cues for hand placement.  (A) to achieve standing & balance.   Ambulation/Gait Ambulation/Gait Assistance: 4: Min guard Ambulation Distance (Feet): 100 Feet Assistive device: Rolling walker Ambulation/Gait Assistance Details: guarding for safety due to pt c/o LE weakness & fatigue.  cues for pursed lip breathing.  2/4 dyspnea.   Gait Pattern: Step-through  pattern;Decreased stride length Gait velocity: decreased Stairs: No Wheelchair Mobility Wheelchair Mobility: No    Exercises General Exercises - Lower Extremity Ankle Circles/Pumps: AROM;Limitations;Both;10 reps Ankle Circles/Pumps Limitations: bil ankles fused Long Arc Quad: AROM;Strengthening;Both;10 reps Heel Slides: AROM;Strengthening;Both;10 reps Straight Leg Raises: AROM;Strengthening;Both;10 reps Hip Flexion/Marching: AROM;Strengthening;Both;10 reps     PT Goals (current goals can now be found in the care plan section) Acute Rehab PT Goals Patient Stated Goal: return home PT Goal Formulation: With patient Time For Goal Achievement: 07/02/13 Potential to Achieve Goals: Good  Visit Information  Last PT Received On: 06/22/13 Assistance Needed: +1 History of Present Illness: Pt presents with 1 wk of diarrhea with Cdiff negative  viral gastroenteritis with metabolic abnormalities    Subjective Data  Patient Stated Goal: return home   Cognition  Cognition Arousal/Alertness: Awake/alert Behavior During Therapy: WFL for tasks assessed/performed Overall Cognitive Status: No family/caregiver present to determine baseline cognitive functioning    Balance     End of Session PT - End of Session Equipment Utilized During Treatment: Gait belt Activity Tolerance: Patient tolerated treatment well Patient left: in chair;with call bell/phone within reach Nurse Communication: Mobility status    Verdell Face, [place] [phone] 06/22/2013

## 2013-06-22 NOTE — Discharge Summary (Addendum)
Physician Discharge Summary  Patient ID: Angela [name] MRN: [id number] DOB/AGE: 1936-07-19 77 y.o.  Admit date: 06/15/2013 Discharge date: 06/23/2013  Admission Diagnoses:acute renal failure Diarrhea Hyponatremia Acidosis Coronary artery disease Rheumatoid arthritis Asthma Dyslipidemia  gastroesophageal reflux disease Hypertension Obesity Osteoporosis Mild aortic stenosis  Discharge Diagnoses:  Active Problems:   Acute renal failure Acute gastroenteritis Atrial fibrillation   Hyponatremia Metabolic acidosis  Coronary artery disease Rheumatoid arthritis Asthma Dyslipidemia Gastroesophageal reflux disease Hypertension, currently on no meds Obesity Osteoporosis Mild aortic stenosis   Discharged Condition: good  Hospital Course: 77 year old female who presented with chills, unsteady gait followed by explosive amounts of diarrhea. Admission labs revealed severe hyponatremia sodium 119 potassium 2.8 and BUN 100 creatinine 8.54, acute renal failure.the patient has severe metabolic acidosis with bicarbonate of 10.the patient had significant hypotension at admission, this resolved with fluid bolus.Marland Kitchenshe was also was in atrial fibrillation.. C. Difficile PCR was negative.she was given stress dose steroids.the patient's respiratory status stabilized, and intubation was not necessary. She's initially treated with amiodarone for atrial fibrillation and then switched tometoprolol.2 days prior to discharge the patient spontaneously converted to normal sinus rhythm and is in normal sinus rhythm at discharge with rate control medications discontinued.vancomycin was initially started for 1-2 gram-positive cocci in blood cultures which proved to be staph epi and vancomycin was discontinued. She was given Diflucan for oral thrush for 3 days. Methotrexate was held, can be resumed on Saturday, August 23. the patient's diarrhea resolved.blood pressure remained on the low side at discharge and no  medication for blood pressure was given .patient was seen by physical therapy and short-term nursing homeplacement for rehabilitation was felt to be appropriate.at discharge the patient's renal function had returned to baseline  Consults: pulmonary/intensive care  Significant Diagnostic Studies: labs: ,at discharge sodium 139 potassium 4.1, chloride 109, bicarbonate 23, BUN 17, creatinine 1.04, WBC 13.9 which was decreasing, hemoglobin 12.1, platelet count : blood culture: positive for Staph epidermidis, one of 2 and C. difficile PCR negative and radiology: CXR: Left basilar opacity  Treatments: IV hydration, antibiotics: vancomycin, cardiac meds: metoprolol and diltiazem, anticoagulation: Plavix and therapies: PT  Discharge Exam: Blood pressure 122/64, pulse 79, temperature 97.5 F (36.4 C), temperature source Oral, resp. rate 16, height 5\' 2"  (1.575 m), weight 80.06 kg (176 lb 8 oz), SpO2 95.00%. Resp: clear to auscultation bilaterally Cardio: regular rate and rhythm, S1, S2 normal, no murmur, click, rub or gallop  Disposition: 03-nursing home   Future Appointments Provider Department Dept Phone   08/05/2013 1:20 PM Ranelle Oyster, MD [facility] [phone]       Medication List    STOP taking these medications       citalopram 20 MG tablet  Commonly known as:  CELEXA      TAKE these medications       butalbital-acetaminophen-caffeine 50-325-40 MG per tablet  Commonly known as:  FIORICET, ESGIC  Take 1 tablet by mouth as needed. For migraines     CALCIUM 500+D PO  Take 1 tablet by mouth 3 (three) times daily.     diclofenac sodium 1 % Gel  Commonly known as:  VOLTAREN  Apply 2 g topically 2 (two) times daily as needed. For pain     EPIPEN 2-PAK 0.3 mg/0.3 mL Soaj injection  Generic drug:  EPINEPHrine  Inject 0.3 mg into the muscle daily as needed. For allergic reaction     ibandronate 150 MG tablet  Commonly known  as:  BONIVA  Take 150 mg by mouth every 30 (thirty) days.     lidocaine 5 %  Commonly known as:  LIDODERM  Place 1-3 patches onto the skin daily as needed. For pain     LORazepam 1 MG tablet  Commonly known as:  ATIVAN  Take 1 tablet (1 mg total) by mouth at bedtime.     methotrexate 2.5 MG tablet  Commonly known as:  RHEUMATREX  Take 6 tablets by mouth once a week. On Saturdays     NITROSTAT 0.4 MG SL tablet  Generic drug:  nitroGLYCERIN  Place 0.4 mg under the tongue every 5 (five) minutes as needed. For chest pain     oxyCODONE-acetaminophen 7.5-325 MG per tablet  Commonly known as:  PERCOCET  Take 1 tablet by mouth every 4 (four) hours as needed for pain.     pantoprazole 40 MG tablet  Commonly known as:  PROTONIX  Take 40 mg by mouth daily.     PLAVIX 75 MG tablet  Generic drug:  clopidogrel  Take 75 mg by mouth every evening.     predniSONE 5 MG tablet  Commonly known as:  DELTASONE  Take 5 mg by mouth daily.     PREVIDENT 0.2 % Soln  Generic drug:  SODIUM FLUORIDE (DENTAL RINSE)  Take by mouth at bedtime. Rinses with about 1 capful nightly     VESICARE 10 MG tablet  Generic drug:  solifenacin  Take 10 mg by mouth every morning.     ZOCOR 40 MG tablet  Generic drug:  simvastatin  Take 40 mg by mouth at bedtime.         SignedLillia Mountain 06/22/2013, 7:54 AM

## 2013-06-22 NOTE — Progress Notes (Signed)
Cosign for Teal Curry's assessment, med administration, notes(s), I/O and care plan/education.  Lorretta Harp RN

## 2013-06-22 NOTE — Progress Notes (Signed)
Per MD- patient is medically stable for d/c to SNF today.  Patient requests placement at [facility]. CSW met with patient and her brother Dannielle Huh a few minutes ago- apologies made that CSW was unable to meet with patient earlier in the day to discuss d/c. As of today- [facility] does not have a bed but a vacancy is anticipated tomorrow.  Discussed with patient and brother that should the vacancy not come available tomorrow- she would have to either accept another bed offer or return home to await a bed at [facility].  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 There is no safe discharge plan to home at this time.  CSW provided support and patient/brother verbalized understanding of above.  CSW attempted to reach Dr. Denton Lank to notify him of above but his office was closed. A pager number was not available.  CSW will contact [facility] in the a.m to insure that bed will be available for patient.  She was extremely appreciative of CSW's assistance.  CSW notified patient's nurse of above.   Lorri Frederick. [place]  [phone]

## 2013-06-22 NOTE — Progress Notes (Signed)
Patient's BP is 99/52; patient is stable, alert and oriented.  Medications given to patient; will continue to monitor patient.  Lorretta Harp RN

## 2013-06-22 NOTE — Progress Notes (Signed)
Subjective: No complaints  Objective: Vital signs in last 24 hours: Temp:  [97.5 F (36.4 C)-98.8 F (37.1 C)] 98.8 F (37.1 C) (08/18 1346) Pulse Rate:  [67-79] 67 (08/18 1346) Resp:  [16-18] 17 (08/18 1346) BP: (93-138)/(46-73) 93/46 mmHg (08/18 1346) SpO2:  [95 %-99 %] 99 % (08/18 1346) Weight:  [80.06 kg (176 lb 8 oz)] 80.06 kg (176 lb 8 oz) (08/18 0539) Weight change: -0.363 kg (-12.8 oz) Last BM Date: 06/21/13  Intake/Output from previous day: 08/17 0701 - 08/18 0700 In: 752 [P.O.:720; I.V.:32] Out: 500 [Urine:500] Intake/Output this shift: Total I/O In: 460 [P.O.:460] Out: 450 [Urine:450]  General appearance: alert and cooperative  Lab Results:  Recent Labs  06/21/13 0554 06/22/13 0550  WBC 14.5* 13.9*  HGB 11.7* 12.1  HCT 34.3* 34.5*  PLT 185 206   BMET  Recent Labs  06/21/13 0554 06/22/13 0550  NA 139 139  K 3.5 4.1  CL 108 109  CO2 24 23  GLUCOSE 93 99  BUN 22 17  CREATININE 1.15* 1.04  CALCIUM 8.0* 8.1*    Studies/Results: No results found.  Medications: I have reviewed the patient's current medications.  Assessment/Plan: CARDIOVASCULAR  A:New onset A. fib. Noted to be on Plavix,. She has converted to NSR.  Hx of HTN was on no meds as outpatient  P:  Beta blocker discontinued  RENAL  A: Acute renal failure secondary to volume depletion with metabolic acidosis. No lactic acidosis- Baseline Scr 0.62 in 2010. Creatinine improving  Hyponatremia/hypochloremia - resolved  P:   GASTROINTESTINAL  A: Severe diarrhea? Cause, C. Diff neg, UC ng > suspect viral gastroenteritis  Carb modified diet. Diarrhea resolved  P:  - PPI for GERD  - imodium prn  INFECTIOUS  A: ? Infectious cause of diarrhea, C. Diff NEG, UC ng likely viral  Immunocompromised secondary to methotrexate use  Thrush Resolved Blood cultures 1/2 staph coag negative, likely contaminant  P:  - D/Ced vancomycin. WBC falling ENDOCRINE  A: At risk for relative adrenal  insufficiency due to chronic prednisone therapy for rheumatoid arthritis  P:  - continue home pred 5mg  qd  NEUROLOGIC  A: Chronic anxiety  Acute altered mental status, confusion resolved.  P:  - qhs ativan restarted 8/12 at 1/2 dose,  -stopped celexa, this was D/Ced about a year ago  MUSCULOSKELETAL  A: Rheumatoid arthritis on methotrexate 12.5 mg every Saturday and prednisone followed in [facility] . Currently Rt elbow in 90 flexion; says is chronic  P:  - continue pred 5 qd  - Continue to hold methotrexate (took it 06/06/13), resume on August 23  DVT Prophylaxis. Started lovenox.  Disposition NHP for rehab planned. She is medically ready for dicharge       LOS: 7 days   Angela [name] JOSEPH 06/22/2013, 5:59 PM

## 2013-06-23 DIAGNOSIS — I4891 Unspecified atrial fibrillation: Secondary | ICD-10-CM | POA: Diagnosis not present

## 2013-06-23 DIAGNOSIS — I809 Phlebitis and thrombophlebitis of unspecified site: Secondary | ICD-10-CM | POA: Diagnosis not present

## 2013-06-23 DIAGNOSIS — I10 Essential (primary) hypertension: Secondary | ICD-10-CM | POA: Diagnosis not present

## 2013-06-23 DIAGNOSIS — N189 Chronic kidney disease, unspecified: Secondary | ICD-10-CM | POA: Diagnosis not present

## 2013-06-23 DIAGNOSIS — E871 Hypo-osmolality and hyponatremia: Secondary | ICD-10-CM | POA: Diagnosis not present

## 2013-06-23 DIAGNOSIS — R509 Fever, unspecified: Secondary | ICD-10-CM | POA: Diagnosis not present

## 2013-06-23 DIAGNOSIS — Z5189 Encounter for other specified aftercare: Secondary | ICD-10-CM | POA: Diagnosis not present

## 2013-06-23 DIAGNOSIS — K5289 Other specified noninfective gastroenteritis and colitis: Secondary | ICD-10-CM | POA: Diagnosis not present

## 2013-06-23 DIAGNOSIS — K219 Gastro-esophageal reflux disease without esophagitis: Secondary | ICD-10-CM | POA: Diagnosis not present

## 2013-06-23 DIAGNOSIS — I509 Heart failure, unspecified: Secondary | ICD-10-CM | POA: Diagnosis not present

## 2013-06-23 DIAGNOSIS — J45909 Unspecified asthma, uncomplicated: Secondary | ICD-10-CM | POA: Diagnosis not present

## 2013-06-23 DIAGNOSIS — M069 Rheumatoid arthritis, unspecified: Secondary | ICD-10-CM | POA: Diagnosis not present

## 2013-06-23 DIAGNOSIS — I359 Nonrheumatic aortic valve disorder, unspecified: Secondary | ICD-10-CM | POA: Diagnosis not present

## 2013-06-23 DIAGNOSIS — I2510 Atherosclerotic heart disease of native coronary artery without angina pectoris: Secondary | ICD-10-CM | POA: Diagnosis not present

## 2013-06-23 DIAGNOSIS — R197 Diarrhea, unspecified: Secondary | ICD-10-CM | POA: Diagnosis not present

## 2013-06-23 DIAGNOSIS — E785 Hyperlipidemia, unspecified: Secondary | ICD-10-CM | POA: Diagnosis not present

## 2013-06-23 DIAGNOSIS — E872 Acidosis: Secondary | ICD-10-CM | POA: Diagnosis not present

## 2013-06-23 DIAGNOSIS — N179 Acute kidney failure, unspecified: Secondary | ICD-10-CM | POA: Diagnosis not present

## 2013-06-23 DIAGNOSIS — R269 Unspecified abnormalities of gait and mobility: Secondary | ICD-10-CM | POA: Diagnosis not present

## 2013-06-23 DIAGNOSIS — R748 Abnormal levels of other serum enzymes: Secondary | ICD-10-CM | POA: Diagnosis not present

## 2013-06-23 NOTE — Progress Notes (Signed)
Physical Therapy Treatment Patient Details Name: Angela [name] MRN: [id number] DOB: 1935/12/06 Today's Date: 06/23/2013 Time: 4098-1191 PT Time Calculation (min): 23 min  PT Assessment / Plan / Recommendation  History of Present Illness Pt presents with 1 wk of diarrhea with Cdiff negative  viral gastroenteritis with metabolic abnormalities   PT Comments   Pt cont's to make progress with mobility but she also states she feels just overall weak & is aware of her decreased activity tolerance.  Pt states plans are for d/c to SNF at [facility] if bed becomes available today.  Cont to follow POC.     Follow Up Recommendations        Does the patient have the potential to tolerate intense rehabilitation     Barriers to Discharge        Equipment Recommendations  Rolling walker with 5" wheels    Recommendations for Other Services    Frequency Min 3X/week   Progress towards PT Goals Progress towards PT goals: Progressing toward goals  Plan      Precautions / Restrictions Precautions Precautions: Fall Restrictions Weight Bearing Restrictions: No   Pertinent Vitals/Pain Pt +SOB with ambulation but 02 sats 98% RA.     Mobility  Bed Mobility Bed Mobility: Not assessed Transfers Transfers: Sit to Stand;Stand to Sit Sit to Stand: 4: Min assist;With upper extremity assist;With armrests;From chair/3-in-1 Stand to Sit: 4: Min guard;With upper extremity assist;With armrests;To chair/3-in-1 Details for Transfer Assistance: Performed 3x's.  cues for hand placement.  (A) to achieve standing & anterior translation of trunk over BOS with initial standing Ambulation/Gait Ambulation/Gait Assistance: 4: Min guard Ambulation Distance (Feet): 115 Feet Assistive device: Rolling walker Ambulation/Gait Assistance Details: Cues for pursed lip breathing.   Gait Pattern: Step-through pattern;Decreased stride length Gait velocity: decreased Stairs: No Wheelchair Mobility Wheelchair  Mobility: No    Exercises General Exercises - Lower Extremity Long Arc Quad: AROM;Strengthening;Both;15 reps Hip Flexion/Marching: AROM;Strengthening;Both;15 reps Toe Raises: AROM;Both;10 reps Heel Raises: AROM;Both;10 reps    PT Goals (current goals can now be found in the care plan section) Acute Rehab PT Goals PT Goal Formulation: With patient Time For Goal Achievement: 07/02/13 Potential to Achieve Goals: Good  Visit Information  Last PT Received On: 06/23/13 Assistance Needed: +1 History of Present Illness: Pt presents with 1 wk of diarrhea with Cdiff negative  viral gastroenteritis with metabolic abnormalities    Subjective Data      Cognition  Cognition Arousal/Alertness: Awake/alert Behavior During Therapy: WFL for tasks assessed/performed Overall Cognitive Status: Within Functional Limits for tasks assessed    Balance     End of Session PT - End of Session Equipment Utilized During Treatment: Gait belt Activity Tolerance: Patient tolerated treatment well Patient left: in chair;with call bell/phone within reach Nurse Communication: Mobility status   GP     [name] 06/23/2013, 9:49 AM  Verdell Face, PTA [phone] 06/23/2013

## 2013-06-23 NOTE — Progress Notes (Signed)
Pt d.c [facility]. Report called to [facility]

## 2013-06-24 NOTE — Clinical Social Work Placement (Addendum)
    Clinical Social Work Department CLINICAL SOCIAL WORK PLACEMENT NOTE 06/24/2013  Patient:  Angela [name], Angela [name]  Account Number:  [id number] Admit date:  06/15/2013  Clinical Social Worker:  Doree Albee  Date/time:  06/20/2013 01:38 PM  Clinical Social Work is seeking post-discharge placement for this patient at the following level of care:   SKILLED NURSING   (*CSW will update this form in Epic as items are completed)   06/20/2013  Patient/family provided with [facility] Department of Clinical Social Work's list of facilities offering this level of care within the geographic area requested by the patient (or if unable, by the patient's family).  06/20/2013  Patient/family informed of their freedom to choose among providers that offer the needed level of care, that participate in Medicare, Medicaid or managed care program needed by the patient, have an available bed and are willing to accept the patient.  06/20/2013  Patient/family informed of [facility]' ownership interest in [name], as well as of the fact that they are under no obligation to receive care at this facility.  PASARR submitted to EDS on 06/20/2013 PASARR number received from EDS on 06/20/2013  FL2 transmitted to all facilities in geographic area requested by pt/family on  06/20/2013 FL2 transmitted to all facilities within larger geographic area on   Patient informed that his/her managed care company has contracts with or will negotiate with  certain facilities, including the following:   NA     Patient/family informed of bed offers received:  06/22/2013 Patient chooses bed at [name], [name] Physician recommends and patient chooses bed at    [facility] be [name] on  06/23/2013 Patient to be transferred to facility by Ambulance  Sharin Mons)  The following physician request were entered in Epic:   Additional  Comments: 06/23/13  Ok for d/c today to [name]. Facility could not offer bed until today.  Patient is very pleased with this bed offer as it is close to her home and her husband can visit her. Patient states that she has notified her husband of d/c; her brother Angela [name] is aware of d/c as well. He is currently assisting patient's husband with care while she is in the [name].  No futher CSW needs identified. CSW signing off.  Lorri Frederick. Mahogony Gilchrest, LCSWA [phone]

## 2013-06-25 DIAGNOSIS — R509 Fever, unspecified: Secondary | ICD-10-CM | POA: Diagnosis not present

## 2013-06-25 DIAGNOSIS — M069 Rheumatoid arthritis, unspecified: Secondary | ICD-10-CM | POA: Diagnosis not present

## 2013-06-25 DIAGNOSIS — I2510 Atherosclerotic heart disease of native coronary artery without angina pectoris: Secondary | ICD-10-CM | POA: Diagnosis not present

## 2013-06-25 DIAGNOSIS — E871 Hypo-osmolality and hyponatremia: Secondary | ICD-10-CM | POA: Diagnosis not present

## 2013-06-27 DIAGNOSIS — R509 Fever, unspecified: Secondary | ICD-10-CM | POA: Diagnosis not present

## 2013-06-27 DIAGNOSIS — R748 Abnormal levels of other serum enzymes: Secondary | ICD-10-CM | POA: Diagnosis not present

## 2013-06-27 DIAGNOSIS — R197 Diarrhea, unspecified: Secondary | ICD-10-CM | POA: Diagnosis not present

## 2013-07-02 DIAGNOSIS — I359 Nonrheumatic aortic valve disorder, unspecified: Secondary | ICD-10-CM | POA: Diagnosis not present

## 2013-07-02 DIAGNOSIS — I509 Heart failure, unspecified: Secondary | ICD-10-CM | POA: Diagnosis not present

## 2013-07-09 DIAGNOSIS — I509 Heart failure, unspecified: Secondary | ICD-10-CM | POA: Diagnosis not present

## 2013-07-19 DIAGNOSIS — I4891 Unspecified atrial fibrillation: Secondary | ICD-10-CM | POA: Diagnosis not present

## 2013-07-19 DIAGNOSIS — M810 Age-related osteoporosis without current pathological fracture: Secondary | ICD-10-CM | POA: Diagnosis not present

## 2013-07-19 DIAGNOSIS — I10 Essential (primary) hypertension: Secondary | ICD-10-CM | POA: Diagnosis not present

## 2013-07-19 DIAGNOSIS — R269 Unspecified abnormalities of gait and mobility: Secondary | ICD-10-CM | POA: Diagnosis not present

## 2013-07-19 DIAGNOSIS — M069 Rheumatoid arthritis, unspecified: Secondary | ICD-10-CM | POA: Diagnosis not present

## 2013-07-21 DIAGNOSIS — I10 Essential (primary) hypertension: Secondary | ICD-10-CM | POA: Diagnosis not present

## 2013-07-21 DIAGNOSIS — R269 Unspecified abnormalities of gait and mobility: Secondary | ICD-10-CM | POA: Diagnosis not present

## 2013-07-21 DIAGNOSIS — M069 Rheumatoid arthritis, unspecified: Secondary | ICD-10-CM | POA: Diagnosis not present

## 2013-07-21 DIAGNOSIS — M810 Age-related osteoporosis without current pathological fracture: Secondary | ICD-10-CM | POA: Diagnosis not present

## 2013-07-21 DIAGNOSIS — I4891 Unspecified atrial fibrillation: Secondary | ICD-10-CM | POA: Diagnosis not present

## 2013-07-23 DIAGNOSIS — M810 Age-related osteoporosis without current pathological fracture: Secondary | ICD-10-CM | POA: Diagnosis not present

## 2013-07-23 DIAGNOSIS — R269 Unspecified abnormalities of gait and mobility: Secondary | ICD-10-CM | POA: Diagnosis not present

## 2013-07-23 DIAGNOSIS — M069 Rheumatoid arthritis, unspecified: Secondary | ICD-10-CM | POA: Diagnosis not present

## 2013-07-23 DIAGNOSIS — I10 Essential (primary) hypertension: Secondary | ICD-10-CM | POA: Diagnosis not present

## 2013-07-23 DIAGNOSIS — I4891 Unspecified atrial fibrillation: Secondary | ICD-10-CM | POA: Diagnosis not present

## 2013-07-27 DIAGNOSIS — I4891 Unspecified atrial fibrillation: Secondary | ICD-10-CM | POA: Diagnosis not present

## 2013-07-27 DIAGNOSIS — I10 Essential (primary) hypertension: Secondary | ICD-10-CM | POA: Diagnosis not present

## 2013-07-27 DIAGNOSIS — N179 Acute kidney failure, unspecified: Secondary | ICD-10-CM | POA: Diagnosis not present

## 2013-07-28 DIAGNOSIS — I10 Essential (primary) hypertension: Secondary | ICD-10-CM | POA: Diagnosis not present

## 2013-07-28 DIAGNOSIS — I4891 Unspecified atrial fibrillation: Secondary | ICD-10-CM | POA: Diagnosis not present

## 2013-07-28 DIAGNOSIS — M069 Rheumatoid arthritis, unspecified: Secondary | ICD-10-CM | POA: Diagnosis not present

## 2013-07-28 DIAGNOSIS — M810 Age-related osteoporosis without current pathological fracture: Secondary | ICD-10-CM | POA: Diagnosis not present

## 2013-07-28 DIAGNOSIS — R269 Unspecified abnormalities of gait and mobility: Secondary | ICD-10-CM | POA: Diagnosis not present

## 2013-07-29 DIAGNOSIS — I10 Essential (primary) hypertension: Secondary | ICD-10-CM | POA: Diagnosis not present

## 2013-07-29 DIAGNOSIS — M069 Rheumatoid arthritis, unspecified: Secondary | ICD-10-CM | POA: Diagnosis not present

## 2013-07-29 DIAGNOSIS — I4891 Unspecified atrial fibrillation: Secondary | ICD-10-CM | POA: Diagnosis not present

## 2013-07-29 DIAGNOSIS — R269 Unspecified abnormalities of gait and mobility: Secondary | ICD-10-CM | POA: Diagnosis not present

## 2013-07-29 DIAGNOSIS — M810 Age-related osteoporosis without current pathological fracture: Secondary | ICD-10-CM | POA: Diagnosis not present

## 2013-07-30 DIAGNOSIS — I4891 Unspecified atrial fibrillation: Secondary | ICD-10-CM | POA: Diagnosis not present

## 2013-07-30 DIAGNOSIS — R269 Unspecified abnormalities of gait and mobility: Secondary | ICD-10-CM | POA: Diagnosis not present

## 2013-07-30 DIAGNOSIS — M810 Age-related osteoporosis without current pathological fracture: Secondary | ICD-10-CM | POA: Diagnosis not present

## 2013-07-30 DIAGNOSIS — I10 Essential (primary) hypertension: Secondary | ICD-10-CM | POA: Diagnosis not present

## 2013-07-30 DIAGNOSIS — M069 Rheumatoid arthritis, unspecified: Secondary | ICD-10-CM | POA: Diagnosis not present

## 2013-07-31 DIAGNOSIS — Z85828 Personal history of other malignant neoplasm of skin: Secondary | ICD-10-CM | POA: Diagnosis not present

## 2013-07-31 DIAGNOSIS — D235 Other benign neoplasm of skin of trunk: Secondary | ICD-10-CM | POA: Diagnosis not present

## 2013-07-31 DIAGNOSIS — D485 Neoplasm of uncertain behavior of skin: Secondary | ICD-10-CM | POA: Diagnosis not present

## 2013-07-31 DIAGNOSIS — L905 Scar conditions and fibrosis of skin: Secondary | ICD-10-CM | POA: Diagnosis not present

## 2013-08-03 DIAGNOSIS — I10 Essential (primary) hypertension: Secondary | ICD-10-CM | POA: Diagnosis not present

## 2013-08-03 DIAGNOSIS — M069 Rheumatoid arthritis, unspecified: Secondary | ICD-10-CM | POA: Diagnosis not present

## 2013-08-03 DIAGNOSIS — I4891 Unspecified atrial fibrillation: Secondary | ICD-10-CM | POA: Diagnosis not present

## 2013-08-03 DIAGNOSIS — R269 Unspecified abnormalities of gait and mobility: Secondary | ICD-10-CM | POA: Diagnosis not present

## 2013-08-03 DIAGNOSIS — M810 Age-related osteoporosis without current pathological fracture: Secondary | ICD-10-CM | POA: Diagnosis not present

## 2013-08-04 DIAGNOSIS — I10 Essential (primary) hypertension: Secondary | ICD-10-CM | POA: Diagnosis not present

## 2013-08-04 DIAGNOSIS — I4891 Unspecified atrial fibrillation: Secondary | ICD-10-CM | POA: Diagnosis not present

## 2013-08-04 DIAGNOSIS — M069 Rheumatoid arthritis, unspecified: Secondary | ICD-10-CM | POA: Diagnosis not present

## 2013-08-04 DIAGNOSIS — M810 Age-related osteoporosis without current pathological fracture: Secondary | ICD-10-CM | POA: Diagnosis not present

## 2013-08-04 DIAGNOSIS — R269 Unspecified abnormalities of gait and mobility: Secondary | ICD-10-CM | POA: Diagnosis not present

## 2013-08-05 ENCOUNTER — Encounter: Payer: Self-pay | Admitting: Physical Medicine & Rehabilitation

## 2013-08-05 ENCOUNTER — Encounter: Payer: Medicare Other | Attending: Physical Medicine and Rehabilitation | Admitting: Physical Medicine & Rehabilitation

## 2013-08-05 VITALS — BP 112/53 | HR 69 | Resp 14 | Ht 62.0 in | Wt 174.0 lb

## 2013-08-05 DIAGNOSIS — Q762 Congenital spondylolisthesis: Secondary | ICD-10-CM | POA: Insufficient documentation

## 2013-08-05 DIAGNOSIS — M069 Rheumatoid arthritis, unspecified: Secondary | ICD-10-CM

## 2013-08-05 DIAGNOSIS — Z981 Arthrodesis status: Secondary | ICD-10-CM | POA: Diagnosis not present

## 2013-08-05 DIAGNOSIS — I10 Essential (primary) hypertension: Secondary | ICD-10-CM | POA: Diagnosis not present

## 2013-08-05 DIAGNOSIS — M4712 Other spondylosis with myelopathy, cervical region: Secondary | ICD-10-CM

## 2013-08-05 DIAGNOSIS — R209 Unspecified disturbances of skin sensation: Secondary | ICD-10-CM | POA: Diagnosis not present

## 2013-08-05 DIAGNOSIS — G8929 Other chronic pain: Secondary | ICD-10-CM | POA: Diagnosis not present

## 2013-08-05 DIAGNOSIS — M4316 Spondylolisthesis, lumbar region: Secondary | ICD-10-CM

## 2013-08-05 DIAGNOSIS — Z23 Encounter for immunization: Secondary | ICD-10-CM | POA: Diagnosis not present

## 2013-08-05 DIAGNOSIS — I2510 Atherosclerotic heart disease of native coronary artery without angina pectoris: Secondary | ICD-10-CM | POA: Diagnosis not present

## 2013-08-05 DIAGNOSIS — M542 Cervicalgia: Secondary | ICD-10-CM | POA: Insufficient documentation

## 2013-08-05 DIAGNOSIS — M4310 Spondylolisthesis, site unspecified: Secondary | ICD-10-CM

## 2013-08-05 MED ORDER — OXYCODONE-ACETAMINOPHEN 7.5-325 MG PO TABS: 1.0000 | ORAL_TABLET | ORAL | Status: DC | PRN

## 2013-08-05 MED ORDER — OXYCODONE-ACETAMINOPHEN 7.5-325 MG PO TABS
1.0000 | ORAL_TABLET | ORAL | Status: DC | PRN
Start: 2013-08-05 — End: 2013-10-06

## 2013-08-05 NOTE — Patient Instructions (Addendum)
CALL ME WITH ANY PROBLEMS OR QUESTIONS (#[phone]).  HAVE A GOOD DAY   PLEASE USE CAUTION AT HOME.   IF YOUR NECK CONTINUES TO GIVE YOU PROBLEMS, PLEASE CONTACT ME

## 2013-08-05 NOTE — Progress Notes (Signed)
Subjective:    Patient ID: Angela [name], female    DOB: 10-21-1936, 77 y.o.   MRN: [id number]  HPI  [name] is back regarding her chronic pain. She was doing fairly well when she was going to water a plant this past August. She became very dehydrated and was admitted for renal failure. After which time, she went to [facility] nursing [facility] until about 2 weeks ago. Since returning home, PT and [facility] have been coming out to the house. PT has been working on balance. She is back to walking limited distances with her walker.  Her husband is now dealing with stage 4 lung cancer. [name] states that they both try to help each other each day.   Pain Inventory Average Pain 4 Pain Right Now 5 My pain is dull and aching  In the last 24 hours, has pain interfered with the following? General activity 8 Relation with others 8 Enjoyment of life 8 What TIME of day is your pain at its worst? morning and evening Sleep (in general) Good  Pain is worse with: walking, bending, standing and some activites Pain improves with: rest and medication Relief from Meds: 8  Mobility walk with assistance use a walker ability to climb steps?  no do you drive?  no  Function retired I need assistance with the following:  dressing, toileting, meal prep, household duties and shopping  Neuro/Psych numbness trouble walking anxiety  Prior Studies Any changes since last visit?  yes  Physicians involved in your care Primary care .   Family History  Problem Relation Age of Onset  . Cancer Mother     lung cancer  . Cancer Brother     melanoma   History   Social History  . Marital Status: Married    Spouse Name: N/A    Number of Children: N/A  . Years of Education: N/A   Social History Main Topics  . Smoking status: Never Smoker   . Smokeless tobacco: Never Used  . Alcohol Use: None  . Drug Use: None  . Sexual Activity: None   Other Topics Concern  . None   Social History Narrative  .  None   Past Surgical History  Procedure Laterality Date  . Fracture surgery  09/2011    left ankle screw and pin removed  . Cardiac catheterization     Past Medical History  Diagnosis Date  . Cervical stenosis of spine   . Rheumatoid arthritis(714.0)   . Spondylolisthesis of lumbar region   . CAD (coronary artery disease)   . Hypertension   . A-fib    BP 112/53  Pulse 69  Resp 14  Ht 5\' 2"  (1.575 m)  Wt 174 lb (78.926 kg)  BMI 31.82 kg/m2  SpO2 98%     Review of Systems  Cardiovascular: Positive for leg swelling.  Gastrointestinal: Positive for diarrhea.  Musculoskeletal: Positive for gait problem.  Neurological: Positive for numbness.  Psychiatric/Behavioral: The patient is nervous/anxious.   All other systems reviewed and are negative.       Objective:   Physical Exam Musculoskeletal:  Continued limited ROM in the neck, shoulder, and hands. She remains in her wheelchair and requies assist for transfers. Right elbow with a callus, resolving hematoma. She can extend elbow to -30 degrees. A few nodules on occipital skull perhaps from her fall. Her cervical ROM appears at baseline.  Neurological: She has normal reflexes. No cranial nerve deficit or sensory deficit.  Ongoing pain inhibition and  generalized weakness of 3-4/5 at baseline.  Psychiatric: She has a normal mood and affect. Her behavior is normal. Judgment and thought content normal.    Assessment & Plan:   ASSESSMENT:  1. History of cervical stenosis/spondylosis with myelopathy.  2. Rheumatoid arthritis.  3. Lumbar spondylolisthesis.  4. Gait disorder related to the above, recent fall with renal failure.  PLAN:  1. She was given one prescription for Percocet 7.5/325 1 p.o. q.6  hours p.r.n., 120. Her questions were encouraged and  Answered. Second script was given for next month.  2. Continue with lidoderm and voltaren gel. lidoderm was reordered.  3. Mrs. Usery looks good despite her fall and  recent hospitalization.  I'll have her see my PA back in 2 months. I asked her to contact me if her neck continues to give her trouble, although I saw nothing alarming on examination today.

## 2013-08-06 ENCOUNTER — Other Ambulatory Visit: Payer: Self-pay

## 2013-08-06 DIAGNOSIS — M810 Age-related osteoporosis without current pathological fracture: Secondary | ICD-10-CM | POA: Diagnosis not present

## 2013-08-06 DIAGNOSIS — Z1231 Encounter for screening mammogram for malignant neoplasm of breast: Secondary | ICD-10-CM

## 2013-08-06 DIAGNOSIS — I4891 Unspecified atrial fibrillation: Secondary | ICD-10-CM | POA: Diagnosis not present

## 2013-08-06 DIAGNOSIS — R269 Unspecified abnormalities of gait and mobility: Secondary | ICD-10-CM | POA: Diagnosis not present

## 2013-08-06 DIAGNOSIS — I10 Essential (primary) hypertension: Secondary | ICD-10-CM | POA: Diagnosis not present

## 2013-08-06 DIAGNOSIS — M069 Rheumatoid arthritis, unspecified: Secondary | ICD-10-CM | POA: Diagnosis not present

## 2013-08-07 DIAGNOSIS — M069 Rheumatoid arthritis, unspecified: Secondary | ICD-10-CM | POA: Diagnosis not present

## 2013-08-07 DIAGNOSIS — I4891 Unspecified atrial fibrillation: Secondary | ICD-10-CM | POA: Diagnosis not present

## 2013-08-07 DIAGNOSIS — R269 Unspecified abnormalities of gait and mobility: Secondary | ICD-10-CM | POA: Diagnosis not present

## 2013-08-07 DIAGNOSIS — M810 Age-related osteoporosis without current pathological fracture: Secondary | ICD-10-CM | POA: Diagnosis not present

## 2013-08-07 DIAGNOSIS — I10 Essential (primary) hypertension: Secondary | ICD-10-CM | POA: Diagnosis not present

## 2013-08-10 DIAGNOSIS — I10 Essential (primary) hypertension: Secondary | ICD-10-CM | POA: Diagnosis not present

## 2013-08-10 DIAGNOSIS — M810 Age-related osteoporosis without current pathological fracture: Secondary | ICD-10-CM | POA: Diagnosis not present

## 2013-08-10 DIAGNOSIS — I4891 Unspecified atrial fibrillation: Secondary | ICD-10-CM | POA: Diagnosis not present

## 2013-08-10 DIAGNOSIS — M069 Rheumatoid arthritis, unspecified: Secondary | ICD-10-CM | POA: Diagnosis not present

## 2013-08-10 DIAGNOSIS — R269 Unspecified abnormalities of gait and mobility: Secondary | ICD-10-CM | POA: Diagnosis not present

## 2013-08-11 DIAGNOSIS — I10 Essential (primary) hypertension: Secondary | ICD-10-CM | POA: Diagnosis not present

## 2013-08-11 DIAGNOSIS — R269 Unspecified abnormalities of gait and mobility: Secondary | ICD-10-CM | POA: Diagnosis not present

## 2013-08-11 DIAGNOSIS — M810 Age-related osteoporosis without current pathological fracture: Secondary | ICD-10-CM | POA: Diagnosis not present

## 2013-08-11 DIAGNOSIS — I4891 Unspecified atrial fibrillation: Secondary | ICD-10-CM | POA: Diagnosis not present

## 2013-08-11 DIAGNOSIS — M069 Rheumatoid arthritis, unspecified: Secondary | ICD-10-CM | POA: Diagnosis not present

## 2013-08-13 DIAGNOSIS — M810 Age-related osteoporosis without current pathological fracture: Secondary | ICD-10-CM | POA: Diagnosis not present

## 2013-08-13 DIAGNOSIS — I4891 Unspecified atrial fibrillation: Secondary | ICD-10-CM | POA: Diagnosis not present

## 2013-08-13 DIAGNOSIS — M069 Rheumatoid arthritis, unspecified: Secondary | ICD-10-CM | POA: Diagnosis not present

## 2013-08-13 DIAGNOSIS — R269 Unspecified abnormalities of gait and mobility: Secondary | ICD-10-CM | POA: Diagnosis not present

## 2013-08-13 DIAGNOSIS — I10 Essential (primary) hypertension: Secondary | ICD-10-CM | POA: Diagnosis not present

## 2013-08-14 DIAGNOSIS — I4891 Unspecified atrial fibrillation: Secondary | ICD-10-CM | POA: Diagnosis not present

## 2013-08-14 DIAGNOSIS — M810 Age-related osteoporosis without current pathological fracture: Secondary | ICD-10-CM | POA: Diagnosis not present

## 2013-08-14 DIAGNOSIS — I10 Essential (primary) hypertension: Secondary | ICD-10-CM | POA: Diagnosis not present

## 2013-08-14 DIAGNOSIS — M069 Rheumatoid arthritis, unspecified: Secondary | ICD-10-CM | POA: Diagnosis not present

## 2013-08-14 DIAGNOSIS — R269 Unspecified abnormalities of gait and mobility: Secondary | ICD-10-CM | POA: Diagnosis not present

## 2013-08-19 DIAGNOSIS — M069 Rheumatoid arthritis, unspecified: Secondary | ICD-10-CM | POA: Diagnosis not present

## 2013-08-19 DIAGNOSIS — M810 Age-related osteoporosis without current pathological fracture: Secondary | ICD-10-CM | POA: Diagnosis not present

## 2013-08-19 DIAGNOSIS — R269 Unspecified abnormalities of gait and mobility: Secondary | ICD-10-CM | POA: Diagnosis not present

## 2013-08-19 DIAGNOSIS — I4891 Unspecified atrial fibrillation: Secondary | ICD-10-CM | POA: Diagnosis not present

## 2013-08-19 DIAGNOSIS — I10 Essential (primary) hypertension: Secondary | ICD-10-CM | POA: Diagnosis not present

## 2013-08-26 DIAGNOSIS — R269 Unspecified abnormalities of gait and mobility: Secondary | ICD-10-CM | POA: Diagnosis not present

## 2013-08-26 DIAGNOSIS — M069 Rheumatoid arthritis, unspecified: Secondary | ICD-10-CM | POA: Diagnosis not present

## 2013-08-26 DIAGNOSIS — I4891 Unspecified atrial fibrillation: Secondary | ICD-10-CM | POA: Diagnosis not present

## 2013-08-26 DIAGNOSIS — M810 Age-related osteoporosis without current pathological fracture: Secondary | ICD-10-CM | POA: Diagnosis not present

## 2013-08-26 DIAGNOSIS — I10 Essential (primary) hypertension: Secondary | ICD-10-CM | POA: Diagnosis not present

## 2013-08-27 ENCOUNTER — Ambulatory Visit
Admission: RE | Admit: 2013-08-27 | Discharge: 2013-08-27 | Disposition: A | Discharge status: Home / Self Care | Payer: Medicare Other | Source: Ambulatory Visit

## 2013-08-27 DIAGNOSIS — Z1231 Encounter for screening mammogram for malignant neoplasm of breast: Secondary | ICD-10-CM

## 2013-09-01 DIAGNOSIS — M2550 Pain in unspecified joint: Secondary | ICD-10-CM | POA: Diagnosis not present

## 2013-09-01 DIAGNOSIS — Z79899 Other long term (current) drug therapy: Secondary | ICD-10-CM | POA: Diagnosis not present

## 2013-09-01 DIAGNOSIS — M069 Rheumatoid arthritis, unspecified: Secondary | ICD-10-CM | POA: Diagnosis not present

## 2013-09-02 DIAGNOSIS — M069 Rheumatoid arthritis, unspecified: Secondary | ICD-10-CM | POA: Diagnosis not present

## 2013-09-02 DIAGNOSIS — R269 Unspecified abnormalities of gait and mobility: Secondary | ICD-10-CM | POA: Diagnosis not present

## 2013-09-02 DIAGNOSIS — I4891 Unspecified atrial fibrillation: Secondary | ICD-10-CM | POA: Diagnosis not present

## 2013-09-02 DIAGNOSIS — M810 Age-related osteoporosis without current pathological fracture: Secondary | ICD-10-CM | POA: Diagnosis not present

## 2013-09-02 DIAGNOSIS — I10 Essential (primary) hypertension: Secondary | ICD-10-CM | POA: Diagnosis not present

## 2013-09-08 DIAGNOSIS — I4891 Unspecified atrial fibrillation: Secondary | ICD-10-CM | POA: Diagnosis not present

## 2013-09-08 DIAGNOSIS — R269 Unspecified abnormalities of gait and mobility: Secondary | ICD-10-CM | POA: Diagnosis not present

## 2013-09-08 DIAGNOSIS — I10 Essential (primary) hypertension: Secondary | ICD-10-CM | POA: Diagnosis not present

## 2013-09-08 DIAGNOSIS — M069 Rheumatoid arthritis, unspecified: Secondary | ICD-10-CM | POA: Diagnosis not present

## 2013-09-08 DIAGNOSIS — M810 Age-related osteoporosis without current pathological fracture: Secondary | ICD-10-CM | POA: Diagnosis not present

## 2013-09-10 DIAGNOSIS — R350 Frequency of micturition: Secondary | ICD-10-CM | POA: Diagnosis not present

## 2013-09-10 DIAGNOSIS — R413 Other amnesia: Secondary | ICD-10-CM | POA: Diagnosis not present

## 2013-09-10 DIAGNOSIS — N318 Other neuromuscular dysfunction of bladder: Secondary | ICD-10-CM | POA: Diagnosis not present

## 2013-09-10 DIAGNOSIS — R7989 Other specified abnormal findings of blood chemistry: Secondary | ICD-10-CM | POA: Diagnosis not present

## 2013-09-10 DIAGNOSIS — I10 Essential (primary) hypertension: Secondary | ICD-10-CM | POA: Diagnosis not present

## 2013-09-14 DIAGNOSIS — I10 Essential (primary) hypertension: Secondary | ICD-10-CM | POA: Diagnosis not present

## 2013-09-14 DIAGNOSIS — I4891 Unspecified atrial fibrillation: Secondary | ICD-10-CM | POA: Diagnosis not present

## 2013-09-14 DIAGNOSIS — M810 Age-related osteoporosis without current pathological fracture: Secondary | ICD-10-CM | POA: Diagnosis not present

## 2013-09-14 DIAGNOSIS — M069 Rheumatoid arthritis, unspecified: Secondary | ICD-10-CM | POA: Diagnosis not present

## 2013-09-14 DIAGNOSIS — R269 Unspecified abnormalities of gait and mobility: Secondary | ICD-10-CM | POA: Diagnosis not present

## 2013-09-16 DIAGNOSIS — L905 Scar conditions and fibrosis of skin: Secondary | ICD-10-CM | POA: Diagnosis not present

## 2013-09-16 DIAGNOSIS — D235 Other benign neoplasm of skin of trunk: Secondary | ICD-10-CM | POA: Diagnosis not present

## 2013-09-17 ENCOUNTER — Other Ambulatory Visit: Payer: Self-pay | Admitting: Physical Medicine & Rehabilitation

## 2013-10-06 ENCOUNTER — Encounter
Payer: Medicare Other | Attending: Physical Medicine and Rehabilitation | Admitting: Physical Medicine and Rehabilitation

## 2013-10-06 ENCOUNTER — Encounter: Payer: Self-pay | Admitting: Physical Medicine and Rehabilitation

## 2013-10-06 VITALS — BP 125/65 | HR 59 | Resp 14 | Ht 64.0 in | Wt 155.4 lb

## 2013-10-06 DIAGNOSIS — R209 Unspecified disturbances of skin sensation: Secondary | ICD-10-CM | POA: Insufficient documentation

## 2013-10-06 DIAGNOSIS — M069 Rheumatoid arthritis, unspecified: Secondary | ICD-10-CM | POA: Diagnosis not present

## 2013-10-06 DIAGNOSIS — M542 Cervicalgia: Secondary | ICD-10-CM | POA: Insufficient documentation

## 2013-10-06 DIAGNOSIS — G8929 Other chronic pain: Secondary | ICD-10-CM | POA: Diagnosis not present

## 2013-10-06 DIAGNOSIS — I2510 Atherosclerotic heart disease of native coronary artery without angina pectoris: Secondary | ICD-10-CM | POA: Diagnosis not present

## 2013-10-06 DIAGNOSIS — M47812 Spondylosis without myelopathy or radiculopathy, cervical region: Secondary | ICD-10-CM | POA: Diagnosis not present

## 2013-10-06 DIAGNOSIS — Q762 Congenital spondylolisthesis: Secondary | ICD-10-CM | POA: Diagnosis not present

## 2013-10-06 DIAGNOSIS — M4712 Other spondylosis with myelopathy, cervical region: Secondary | ICD-10-CM | POA: Insufficient documentation

## 2013-10-06 DIAGNOSIS — Z981 Arthrodesis status: Secondary | ICD-10-CM | POA: Diagnosis not present

## 2013-10-06 DIAGNOSIS — I10 Essential (primary) hypertension: Secondary | ICD-10-CM | POA: Diagnosis not present

## 2013-10-06 MED ORDER — OXYCODONE-ACETAMINOPHEN 7.5-325 MG PO TABS: 1.0000 | ORAL_TABLET | ORAL | Status: DC | PRN

## 2013-10-06 MED ORDER — DICLOFENAC SODIUM 1 % TD GEL: 2.0000 g | Freq: Two times a day (BID) | TRANSDERMAL | Status: DC | PRN

## 2013-10-06 NOTE — Progress Notes (Signed)
Subjective:    Patient ID: Angela [name], female    DOB: [birth year], 77 y.o.   MRN: [id number]  HPI The patient complains about chronic neck pain which radiates into left arm in a C6 distribution. The patient also complains about numbness and tingling in the same distribution.  The problem is stable at this point. She has followed up with her neurosurgeon Dr. Nila Nephew, he took x-rays of her C-spine, which showed collapsed disc at C5-C7, he ordered a CT-scan and a MRI,results see assessment.  The patient reports that her husband was dx with lung cancer stage 4.  She complains about some increased pain in the last 2 month, because she had to be more active and walk much more when she accompanied her husband to his treatments..   Pain Inventory Average Pain 7 Pain Right Now 7 My pain is burning, dull and aching  In the last 24 hours, has pain interfered with the following? General activity 9 Relation with others 9 Enjoyment of life 9 What TIME of day is your pain at its worst? morning Sleep (in general) Good  Pain is worse with: walking, bending, standing and some activites Pain improves with: rest, heat/ice and medication Relief from Meds: 9  Mobility use a walker ability to climb steps?  no do you drive?  no  Function disabled: date disabled . I need assistance with the following:  dressing, toileting, meal prep, household duties and shopping  Neuro/Psych bladder control problems numbness trouble walking anxiety  Prior Studies Any changes since last visit?  no  Physicians involved in your care Any changes since last visit?  no   Family History  Problem Relation Age of Onset  . Cancer Mother     lung cancer  . Cancer Brother     melanoma   History   Social History  . Marital Status: Married    Spouse Name: N/A    Number of Children: N/A  . Years of Education: N/A   Social History Main Topics  . Smoking status: Never Smoker   . Smokeless tobacco:  Never Used  . Alcohol Use: None  . Drug Use: None  . Sexual Activity: None   Other Topics Concern  . None   Social History Narrative  . None   Past Surgical History  Procedure Laterality Date  . Fracture surgery  09/2011    left ankle screw and pin removed  . Cardiac catheterization     Past Medical History  Diagnosis Date  . Cervical stenosis of spine   . Rheumatoid arthritis(714.0)   . Spondylolisthesis of lumbar region   . CAD (coronary artery disease)   . Hypertension   . A-fib    BP 125/65  Pulse 59  Resp 14  Ht 5\' 4"  (1.626 m)  Wt 155 lb 6.4 oz (70.489 kg)  BMI 26.66 kg/m2  SpO2 97%   Review of Systems  Cardiovascular: Positive for leg swelling.  Genitourinary:       Bladder control problems  Musculoskeletal: Positive for gait problem.  Neurological: Positive for numbness.  Psychiatric/Behavioral: The patient is nervous/anxious.   All other systems reviewed and are negative.       Objective:   Physical Exam Constitutional: She is oriented to person, place, and time. She appears well-developed and well-nourished.  HENT:  Head: Normocephalic.  Musculoskeletal: She exhibits tenderness.  Neurological: She is alert and oriented to person, place, and time.  Skin: Skin is warm and dry.  Psychiatric:  She has a normal mood and affect.  Symmetric normal motor tone is noted throughout. Decreased muscle bulk. Muscle testing reveals 4/5 muscle strength of the upper extremity, and 5/5 of the lower extremity. Range of motion in upper and lower extremities is restricted due to her RA, fusion of ankles bilateral, fracture ankylosis of right elbow, deformity of wrist and fingers, arthritis in left shoulder, abd. 45 degrees, external rotation 5 degrees. ROM of spine is restricted. Fine motor movements are severely restricted in both hands.  DTR in the upper and lower extremity are present and symmetric 2+. No clonus is noted.  Walks with a walker        Assessment &  Plan:  1. History of cervical stenosis/spondylosis with myelopathy, s/p PSF C-spine. Is following up with Dr. Newell Coral, who ordered Ct-scan and MRI of C-spine, which showed healed fusion occiput to C4, multifactorial spinal stenosis C4-7, and spondylolisthesis mainly C4-5. Dr. Newell Coral stated that she is not a good surgical candidate at this time.  2. Rheumatoid arthritis. Recommended Arnica cream for joint and muscle pain to try. Also recommended moving her hands in warm water with epson salt, she could also use a warm foot bath with epson salt for pain relief.  3. Lumbar spondylolisthesis, some increased LBP after being more on her feet, since her husband's dx of lung cancer.  PLAN:  1. She was given one prescription for Percocet 7.5/325 1 p.o. q.6  hours p.r.n., 120.And a second one to be filled when due.  Continue with the Lidoderm patches prn pain  Her questions were encouraged and  Answered.  2. Continue with lidoderm and voltaren gel.  3. PA back in 2 months.

## 2013-10-06 NOTE — Patient Instructions (Signed)
Stay as active as tolerated. 

## 2013-11-03 ENCOUNTER — Encounter: Payer: Self-pay | Admitting: General Surgery

## 2013-11-03 DIAGNOSIS — E785 Hyperlipidemia, unspecified: Secondary | ICD-10-CM

## 2013-11-03 DIAGNOSIS — I10 Essential (primary) hypertension: Secondary | ICD-10-CM

## 2013-11-03 DIAGNOSIS — I493 Ventricular premature depolarization: Secondary | ICD-10-CM

## 2013-11-03 HISTORY — DX: Essential (primary) hypertension: I10

## 2013-11-20 ENCOUNTER — Ambulatory Visit: Payer: Medicare Other | Admitting: Cardiology

## 2013-12-02 DIAGNOSIS — Z79899 Other long term (current) drug therapy: Secondary | ICD-10-CM | POA: Diagnosis not present

## 2013-12-02 DIAGNOSIS — M069 Rheumatoid arthritis, unspecified: Secondary | ICD-10-CM | POA: Diagnosis not present

## 2013-12-02 DIAGNOSIS — M533 Sacrococcygeal disorders, not elsewhere classified: Secondary | ICD-10-CM | POA: Diagnosis not present

## 2013-12-02 DIAGNOSIS — M2550 Pain in unspecified joint: Secondary | ICD-10-CM | POA: Diagnosis not present

## 2013-12-02 DIAGNOSIS — N318 Other neuromuscular dysfunction of bladder: Secondary | ICD-10-CM | POA: Diagnosis not present

## 2013-12-08 ENCOUNTER — Encounter: Payer: Medicare Other | Attending: Physical Medicine and Rehabilitation | Admitting: Physical Medicine & Rehabilitation

## 2013-12-08 ENCOUNTER — Encounter: Payer: Self-pay | Admitting: Physical Medicine & Rehabilitation

## 2013-12-08 VITALS — BP 117/69 | HR 72 | Resp 14 | Ht 64.0 in | Wt 146.0 lb

## 2013-12-08 DIAGNOSIS — M4310 Spondylolisthesis, site unspecified: Secondary | ICD-10-CM

## 2013-12-08 DIAGNOSIS — M069 Rheumatoid arthritis, unspecified: Secondary | ICD-10-CM

## 2013-12-08 DIAGNOSIS — M4316 Spondylolisthesis, lumbar region: Secondary | ICD-10-CM

## 2013-12-08 DIAGNOSIS — M4712 Other spondylosis with myelopathy, cervical region: Secondary | ICD-10-CM

## 2013-12-08 MED ORDER — LIDOCAINE 5 % EX PTCH: MEDICATED_PATCH | CUTANEOUS | Status: DC

## 2013-12-08 MED ORDER — OXYCODONE-ACETAMINOPHEN 7.5-325 MG PO TABS: 1.0000 | ORAL_TABLET | ORAL | Status: DC | PRN

## 2013-12-08 NOTE — Progress Notes (Signed)
Subjective:    Patient ID: Angela [name], female    DOB: 09/05/1936, 78 y.o.   MRN: [id number]  HPI  Mrs. Danford is back regarding her chronic pain related to OA and lumbar spondylolisthesis. Her husband passed away at the beginning of the new year. She is learning to adjust to life after him and it has been difficult at times.   Her pain levels have been fairly stable all considering the psychodynamics of the last month. She uses the percocet for her  Baseline paint control. She finds the voltaren gel and lidoderm patches helpful as well. She is taking celexa but not on a scheduled basis.   Pain Inventory Average Pain 7 Pain Right Now 7 My pain is aching  In the last 24 hours, has pain interfered with the following? General activity 7 Relation with others 7 Enjoyment of life 7 What TIME of day is your pain at its worst? morning Sleep (in general) Good  Pain is worse with: walking, bending and standing Pain improves with: heat/ice and medication Relief from Meds: 9  Mobility use a walker how many minutes can you walk? 5-8 ability to climb steps?  no do you drive?  no use a wheelchair  Function retired I need assistance with the following:  toileting, meal prep, household duties and shopping  Neuro/Psych bladder control problems numbness tingling trouble walking anxiety  Prior Studies Any changes since last visit?  no  Physicians involved in your care Any changes since last visit?  no   Family History  Problem Relation Age of Onset  . Cancer Mother     lung cancer  . Cancer Brother     melanoma   History   Social History  . Marital Status: Married    Spouse Name: N/A    Number of Children: N/A  . Years of Education: N/A   Social History Main Topics  . Smoking status: Never Smoker   . Smokeless tobacco: Never Used  . Alcohol Use: No  . Drug Use: No  . Sexual Activity: None   Other Topics Concern  . None   Social History Narrative  . None     Past Surgical History  Procedure Laterality Date  . Fracture surgery  09/2011    left ankle screw and pin removed  . Cardiac catheterization     Past Medical History  Diagnosis Date  . Cervical stenosis of spine   . Rheumatoid arthritis(714.0)   . Spondylolisthesis of lumbar region   . Hypertension   . A-fib   . CAD (coronary artery disease)     s/p PCI with BMS to mid LAD--2/08 not on aspirin due to frequent ulcers  . Asthma   . Depression   . Dyslipidemia   . GERD (gastroesophageal reflux disease)   . Obesity   . PVC's (premature ventricular contractions)   . PUD (peptic ulcer disease)     can not take aspirin  . Osteoporosis     alendronate since 2012  . Macular degeneration   . Aortic stenosis     mild by echo 03/2012   BP 117/69  Pulse 72  Resp 14  Ht 5\' 4"  (1.626 m)  Wt 146 lb (66.225 kg)  BMI 25.05 kg/m2  SpO2 97%  Opioid Risk Score: 0 Fall Risk Score: High Fall Risk (>13 points) (patient educated handout given)   Review of Systems  Constitutional: Positive for unexpected weight change.  Genitourinary: Positive for difficulty urinating.  Musculoskeletal:  Positive for back pain and gait problem.  Neurological: Positive for numbness.  Psychiatric/Behavioral: The patient is nervous/anxious.   All other systems reviewed and are negative.       Objective:   Physical Exam  She ambulated with the rolling walker. She takes short steps but is fairly stable. Requires extra time to change directions. .  She can extend elbow to -30 degrees.  Her cervical ROM appears at baseline.  Neurological: She has normal reflexes. No cranial nerve deficit or sensory deficit.  Ongoing pain inhibition and generalized weakness of 3-4/5 in all 4's near baseline.  Psychiatric: She has a normal mood and affect. Her behavior is normal. Judgment and thought content normal.    Assessment & Plan:   ASSESSMENT:  1. History of cervical stenosis/spondylosis with myelopathy.  2.  Rheumatoid arthritis.  3. Lumbar spondylolisthesis.  4. Gait disorder related to the above, recent fall with renal failure.    PLAN:  1. She was given one prescription for Percocet 7.5/325 1 p.o. q.6  hours p.r.n., 120. Her questions were encouraged and  Answered.   2. Continue with lidoderm and voltaren gel. lidoderm was reordered.  3. Mrs. Bello looks good despite her fall and recent hospitalization. I'll have her see me back in 2 months.  4. We discussed living arrangements at length and potential options. I made a referral to [facility] for social work assessment and assistance with hiring help at home, potential options. I will also speak with my SW at the hospital. She might consider ALF as well.

## 2013-12-08 NOTE — Patient Instructions (Signed)
I WILL CONTACT MY SOCIAL WORKER ALSO REGARDING OPTIONS FOR YOU AT HOME.

## 2013-12-16 ENCOUNTER — Telehealth: Payer: Self-pay

## 2013-12-16 DIAGNOSIS — R269 Unspecified abnormalities of gait and mobility: Secondary | ICD-10-CM | POA: Diagnosis not present

## 2013-12-16 DIAGNOSIS — F329 Major depressive disorder, single episode, unspecified: Secondary | ICD-10-CM | POA: Diagnosis not present

## 2013-12-16 DIAGNOSIS — M069 Rheumatoid arthritis, unspecified: Secondary | ICD-10-CM | POA: Diagnosis not present

## 2013-12-16 DIAGNOSIS — IMO0001 Reserved for inherently not codable concepts without codable children: Secondary | ICD-10-CM | POA: Diagnosis not present

## 2013-12-16 DIAGNOSIS — I2510 Atherosclerotic heart disease of native coronary artery without angina pectoris: Secondary | ICD-10-CM | POA: Diagnosis not present

## 2013-12-16 DIAGNOSIS — M4712 Other spondylosis with myelopathy, cervical region: Secondary | ICD-10-CM | POA: Diagnosis not present

## 2013-12-16 DIAGNOSIS — F3289 Other specified depressive episodes: Secondary | ICD-10-CM | POA: Diagnosis not present

## 2013-12-16 NOTE — Telephone Encounter (Signed)
Sheraya @ [facility] called to inform us that she had been trying to reach the patient to make appt to go to home for PT and SW. Audie Clear said she only had one number for the patient, so I contacted patient and she said that [facility] finally reached her and scheduled appt for 2/11 @ 2:30pm.

## 2013-12-17 ENCOUNTER — Telehealth: Payer: Self-pay

## 2013-12-17 NOTE — Telephone Encounter (Signed)
Angela [name]( PT @ [facility]) is requesting a verbal order to start PT. 3x a week for 2 weeks, 2x a week for 3 weeks. Is this okay?

## 2013-12-17 NOTE — Telephone Encounter (Signed)
Yes

## 2013-12-18 NOTE — Telephone Encounter (Signed)
Contacted Sheraya to give her a verbal order per Dr. Naaman Plummer to start PT with the patient.

## 2013-12-21 DIAGNOSIS — M069 Rheumatoid arthritis, unspecified: Secondary | ICD-10-CM | POA: Diagnosis not present

## 2013-12-21 DIAGNOSIS — F3289 Other specified depressive episodes: Secondary | ICD-10-CM | POA: Diagnosis not present

## 2013-12-21 DIAGNOSIS — M4712 Other spondylosis with myelopathy, cervical region: Secondary | ICD-10-CM | POA: Diagnosis not present

## 2013-12-21 DIAGNOSIS — R269 Unspecified abnormalities of gait and mobility: Secondary | ICD-10-CM | POA: Diagnosis not present

## 2013-12-21 DIAGNOSIS — F329 Major depressive disorder, single episode, unspecified: Secondary | ICD-10-CM | POA: Diagnosis not present

## 2013-12-21 DIAGNOSIS — IMO0001 Reserved for inherently not codable concepts without codable children: Secondary | ICD-10-CM | POA: Diagnosis not present

## 2013-12-21 DIAGNOSIS — I2510 Atherosclerotic heart disease of native coronary artery without angina pectoris: Secondary | ICD-10-CM | POA: Diagnosis not present

## 2013-12-24 DIAGNOSIS — M4712 Other spondylosis with myelopathy, cervical region: Secondary | ICD-10-CM | POA: Diagnosis not present

## 2013-12-24 DIAGNOSIS — R269 Unspecified abnormalities of gait and mobility: Secondary | ICD-10-CM | POA: Diagnosis not present

## 2013-12-24 DIAGNOSIS — F329 Major depressive disorder, single episode, unspecified: Secondary | ICD-10-CM | POA: Diagnosis not present

## 2013-12-24 DIAGNOSIS — I2510 Atherosclerotic heart disease of native coronary artery without angina pectoris: Secondary | ICD-10-CM | POA: Diagnosis not present

## 2013-12-24 DIAGNOSIS — F3289 Other specified depressive episodes: Secondary | ICD-10-CM | POA: Diagnosis not present

## 2013-12-24 DIAGNOSIS — IMO0001 Reserved for inherently not codable concepts without codable children: Secondary | ICD-10-CM | POA: Diagnosis not present

## 2013-12-24 DIAGNOSIS — M069 Rheumatoid arthritis, unspecified: Secondary | ICD-10-CM | POA: Diagnosis not present

## 2013-12-25 DIAGNOSIS — M4712 Other spondylosis with myelopathy, cervical region: Secondary | ICD-10-CM | POA: Diagnosis not present

## 2013-12-25 DIAGNOSIS — M069 Rheumatoid arthritis, unspecified: Secondary | ICD-10-CM | POA: Diagnosis not present

## 2013-12-25 DIAGNOSIS — F329 Major depressive disorder, single episode, unspecified: Secondary | ICD-10-CM | POA: Diagnosis not present

## 2013-12-25 DIAGNOSIS — IMO0001 Reserved for inherently not codable concepts without codable children: Secondary | ICD-10-CM | POA: Diagnosis not present

## 2013-12-25 DIAGNOSIS — F3289 Other specified depressive episodes: Secondary | ICD-10-CM | POA: Diagnosis not present

## 2013-12-25 DIAGNOSIS — I2510 Atherosclerotic heart disease of native coronary artery without angina pectoris: Secondary | ICD-10-CM | POA: Diagnosis not present

## 2013-12-25 DIAGNOSIS — R269 Unspecified abnormalities of gait and mobility: Secondary | ICD-10-CM | POA: Diagnosis not present

## 2013-12-30 DIAGNOSIS — R269 Unspecified abnormalities of gait and mobility: Secondary | ICD-10-CM | POA: Diagnosis not present

## 2013-12-30 DIAGNOSIS — I2510 Atherosclerotic heart disease of native coronary artery without angina pectoris: Secondary | ICD-10-CM | POA: Diagnosis not present

## 2013-12-30 DIAGNOSIS — F3289 Other specified depressive episodes: Secondary | ICD-10-CM | POA: Diagnosis not present

## 2013-12-30 DIAGNOSIS — IMO0001 Reserved for inherently not codable concepts without codable children: Secondary | ICD-10-CM | POA: Diagnosis not present

## 2013-12-30 DIAGNOSIS — M4712 Other spondylosis with myelopathy, cervical region: Secondary | ICD-10-CM | POA: Diagnosis not present

## 2013-12-30 DIAGNOSIS — M069 Rheumatoid arthritis, unspecified: Secondary | ICD-10-CM | POA: Diagnosis not present

## 2013-12-30 DIAGNOSIS — F329 Major depressive disorder, single episode, unspecified: Secondary | ICD-10-CM | POA: Diagnosis not present

## 2014-01-01 DIAGNOSIS — I2510 Atherosclerotic heart disease of native coronary artery without angina pectoris: Secondary | ICD-10-CM | POA: Diagnosis not present

## 2014-01-01 DIAGNOSIS — M4712 Other spondylosis with myelopathy, cervical region: Secondary | ICD-10-CM | POA: Diagnosis not present

## 2014-01-01 DIAGNOSIS — IMO0001 Reserved for inherently not codable concepts without codable children: Secondary | ICD-10-CM | POA: Diagnosis not present

## 2014-01-01 DIAGNOSIS — R269 Unspecified abnormalities of gait and mobility: Secondary | ICD-10-CM | POA: Diagnosis not present

## 2014-01-01 DIAGNOSIS — F329 Major depressive disorder, single episode, unspecified: Secondary | ICD-10-CM | POA: Diagnosis not present

## 2014-01-01 DIAGNOSIS — M069 Rheumatoid arthritis, unspecified: Secondary | ICD-10-CM | POA: Diagnosis not present

## 2014-01-01 DIAGNOSIS — F3289 Other specified depressive episodes: Secondary | ICD-10-CM | POA: Diagnosis not present

## 2014-01-05 DIAGNOSIS — I2510 Atherosclerotic heart disease of native coronary artery without angina pectoris: Secondary | ICD-10-CM | POA: Diagnosis not present

## 2014-01-05 DIAGNOSIS — F329 Major depressive disorder, single episode, unspecified: Secondary | ICD-10-CM | POA: Diagnosis not present

## 2014-01-05 DIAGNOSIS — R269 Unspecified abnormalities of gait and mobility: Secondary | ICD-10-CM | POA: Diagnosis not present

## 2014-01-05 DIAGNOSIS — M4712 Other spondylosis with myelopathy, cervical region: Secondary | ICD-10-CM | POA: Diagnosis not present

## 2014-01-05 DIAGNOSIS — M069 Rheumatoid arthritis, unspecified: Secondary | ICD-10-CM | POA: Diagnosis not present

## 2014-01-05 DIAGNOSIS — IMO0001 Reserved for inherently not codable concepts without codable children: Secondary | ICD-10-CM | POA: Diagnosis not present

## 2014-01-05 DIAGNOSIS — F3289 Other specified depressive episodes: Secondary | ICD-10-CM | POA: Diagnosis not present

## 2014-01-08 DIAGNOSIS — F329 Major depressive disorder, single episode, unspecified: Secondary | ICD-10-CM | POA: Diagnosis not present

## 2014-01-08 DIAGNOSIS — IMO0001 Reserved for inherently not codable concepts without codable children: Secondary | ICD-10-CM | POA: Diagnosis not present

## 2014-01-08 DIAGNOSIS — R269 Unspecified abnormalities of gait and mobility: Secondary | ICD-10-CM | POA: Diagnosis not present

## 2014-01-08 DIAGNOSIS — F3289 Other specified depressive episodes: Secondary | ICD-10-CM | POA: Diagnosis not present

## 2014-01-08 DIAGNOSIS — M069 Rheumatoid arthritis, unspecified: Secondary | ICD-10-CM | POA: Diagnosis not present

## 2014-01-08 DIAGNOSIS — M4712 Other spondylosis with myelopathy, cervical region: Secondary | ICD-10-CM | POA: Diagnosis not present

## 2014-01-08 DIAGNOSIS — I2510 Atherosclerotic heart disease of native coronary artery without angina pectoris: Secondary | ICD-10-CM | POA: Diagnosis not present

## 2014-01-11 ENCOUNTER — Ambulatory Visit (INDEPENDENT_AMBULATORY_CARE_PROVIDER_SITE_OTHER): Payer: Medicare Other | Admitting: Interventional Cardiology

## 2014-01-11 ENCOUNTER — Telehealth: Payer: Self-pay | Admitting: Cardiology

## 2014-01-11 ENCOUNTER — Encounter: Payer: Self-pay | Admitting: Interventional Cardiology

## 2014-01-11 VITALS — BP 140/67 | HR 60 | Ht 64.0 in | Wt 144.0 lb

## 2014-01-11 DIAGNOSIS — I25810 Atherosclerosis of coronary artery bypass graft(s) without angina pectoris: Secondary | ICD-10-CM | POA: Diagnosis not present

## 2014-01-11 DIAGNOSIS — R0602 Shortness of breath: Secondary | ICD-10-CM

## 2014-01-11 DIAGNOSIS — I10 Essential (primary) hypertension: Secondary | ICD-10-CM | POA: Diagnosis not present

## 2014-01-11 DIAGNOSIS — I493 Ventricular premature depolarization: Secondary | ICD-10-CM

## 2014-01-11 DIAGNOSIS — R079 Chest pain, unspecified: Secondary | ICD-10-CM | POA: Diagnosis not present

## 2014-01-11 DIAGNOSIS — I4949 Other premature depolarization: Secondary | ICD-10-CM

## 2014-01-11 NOTE — Patient Instructions (Signed)
Your physician recommends that you continue on your current medications as directed. Please refer to the Current Medication list given to you today.  Your physician recommends that you return for lab work today for BNP and BMet.  Your physician recommends that you schedule a follow-up appointment in: 1 month with Dr. Radford Pax.

## 2014-01-11 NOTE — Telephone Encounter (Signed)
TO Amy to make aware.

## 2014-01-11 NOTE — Progress Notes (Signed)
Patient ID: Angela [name], female   DOB: 09/08/1936, 78 y.o.   MRN: [id number]    Angela [name], Angela [name], Angela [name]  [id number] Phone: [phone] Fax:  [phone]  Date:  01/11/2014   ID:  Angela [name], DOB Nov 07, 1935, MRN [id number]  PCP:  Irven Shelling, MD      History of Present Illness: [name] is a 78 y.o. female who has a h/o CAD with a BMS to the LAD in 2008.  She has been under a lot of stress recently. Her husband recently passed away. Over the past few weeks, she has noticed increasing lower extremity swelling. When she lies down, she feels a pressure in her chest as well. She underwent stress testing in May 2013 showing patent coronary arteries. Chills or had a cardiac cath in November 2013 showing a widely patent LAD stent without any significant obstructive coronary disease. Currently, she has no exertional symptoms. Her only symptoms occur when she lies down at night. She has not woken up short of breath. She does have some dyspnea on exertion when she uses her walker.    Wt Readings from Last 3 Encounters:  01/11/14 144 lb (65.318 kg)  12/08/13 146 lb (66.225 kg)  10/06/13 155 lb 6.4 oz (70.489 kg)     Past Medical History  Diagnosis Date  . Cervical stenosis of spine   . Rheumatoid arthritis(714.0)   . Spondylolisthesis of lumbar region   . Hypertension   . A-fib   . CAD (coronary artery disease)     s/p PCI with BMS to mid LAD--2/08 not on aspirin due to frequent ulcers  . Asthma   . Depression   . Dyslipidemia   . GERD (gastroesophageal reflux disease)   . Obesity   . PVC's (premature ventricular contractions)   . PUD (peptic ulcer disease)     can not take aspirin  . Osteoporosis     alendronate since 2012  . Macular degeneration   . Aortic stenosis     mild by echo 03/2012    Current Outpatient Prescriptions  Medication Sig Dispense Refill  . butalbital-acetaminophen-caffeine (FIORICET, ESGIC) 50-325-40 MG per tablet Take 1  tablet by mouth as needed. For migraines      . Calcium Carbonate-Vitamin D (CALCIUM 500+D PO) Take 1 tablet by mouth 3 (three) times daily.      . citalopram (CELEXA) 20 MG tablet TAKE 1 TABLET DAILY  90 tablet  2  . clopidogrel (PLAVIX) 75 MG tablet Take 75 mg by mouth every evening.       . diclofenac sodium (VOLTAREN) 1 % GEL Apply 2 g topically 2 (two) times daily as needed. For pain  2 Tube  2  . EPIPEN 2-PAK 0.3 MG/0.3ML SOAJ Inject 0.3 mg into the muscle daily as needed. For allergic reaction      . folic acid (FOLVITE) 1 MG tablet       . ibandronate (BONIVA) 150 MG tablet Take 150 mg by mouth every 30 (thirty) days.       Marland Kitchen lidocaine (LIDODERM) 5 % APPLY 1 PATCH ONTO THE SKIN DAILY REMOVE AND DISCARD PATCH WITHIN 12 HOURS OR AS DIRECTED BY PRESCRIBER  90 patch  2  . LORazepam (ATIVAN) 1 MG tablet Take 1 tablet (1 mg total) by mouth at bedtime.  30 tablet  5  . methotrexate (RHEUMATREX) 2.5 MG tablet Take 6 tablets by mouth once a week. On Saturdays      .  metoprolol succinate (TOPROL-XL) 25 MG 24 hr tablet Take 25 mg by mouth daily.      Marland Kitchen NITROSTAT 0.4 MG SL tablet Place 0.4 mg under the tongue every 5 (five) minutes as needed. For chest pain      . oxyCODONE-acetaminophen (PERCOCET) 7.5-325 MG per tablet Take 1 tablet by mouth every 4 (four) hours as needed for pain.  120 tablet  0  . pantoprazole (PROTONIX) 40 MG tablet Take 40 mg by mouth daily.      . predniSONE (DELTASONE) 5 MG tablet Take 5 mg by mouth daily.      Marland Kitchen PREVIDENT 0.2 % SOLN Take by mouth at bedtime. Rinses with about 1 capful nightly      . simvastatin (ZOCOR) 40 MG tablet Take 40 mg by mouth at bedtime.       . tolterodine (DETROL LA) 4 MG 24 hr capsule        No current facility-administered medications for this visit.    Allergies:    Allergies  Allergen Reactions  . Peanut-Containing Drug Products Anaphylaxis  . Reglan [Metoclopramide] Other (See Comments)    "Messed my mind up"  . Codeine Nausea Only    . Iohexol Swelling     Desc: pt received hypaque 60% in 1994 and had erythema, hives & lips swell.  She did ok w/o premeds in 8/[facility] at [facility].  Pt. takes daily prednisone for rheumatoid arthritis.  Poor venous access today (7/7/6) so we did her w/o iv contrast.   . Shellfish Allergy Swelling    Lips swell    Social History:  The patient  reports that she has never smoked. She has never used smokeless tobacco. She reports that she does not drink alcohol or use illicit drugs.   Family History:  The patient's family history includes Cancer in her brother and mother.   ROS:  Please see the history of present illness.  No nausea, vomiting.  No fevers, chills.  No focal weakness.  No dysuria.    All other systems reviewed and negative.   PHYSICAL EXAM: VS:  BP 140/67  Pulse 60  Ht '5\' 4"'  (1.626 m)  Wt 144 lb (65.318 kg)  BMI 24.71 kg/m2 Well nourished, well developed, in no acute distress HEENT: normal Neck: no JVD, no carotid bruits Cardiac:  normal S1, S2; RRR;  Lungs:  clear to auscultation bilaterally, no wheezing, rhonchi or rales Abd: soft, nontender, no hepatomegaly Ext: Bilateral lower extremity pitting edema Skin: warm and dry Neuro:   no focal abnormalities noted  EKG:  Normal sinus rhythm with  PVCs, non-specific t ST segment changes  ASSESSMENT AND PLAN:  1. Shortness of breath/edema: Dyspnea on exertion and edema may be related to fluid overload. Will check BNP and be met. If elevated, will treat with short course of furosemide. She is concerned to 2 bladder spasms. We'll try to keep the course of diuretic as short as possible. 2. CAD/chest discomfort: Her chest discomfort is atypical. It is not exertional. She did have a cath in 2013 showing no significant coronary artery disease. We'll hold off on any ischemia workup at this time. ECG was reassuring.  Signed, Mina Marble, MD, [facility] 01/11/2014 4:11 PM

## 2014-01-11 NOTE — Telephone Encounter (Signed)
Returned call to patient she stated she has been having chest heaviness and swelling in both ankles for the past 3 weeks,alittle sob.Appointment scheduled with Dr.Varanasi this afternoon 01/11/13.

## 2014-01-11 NOTE — Telephone Encounter (Signed)
New problems   Chest tightening at night when laying down , swelling in both ankles for the last 3 wk'ss it has gotten worse.   Pt would needs an appointment please.  [facility] will print notes from [facility].

## 2014-01-11 NOTE — Telephone Encounter (Signed)
NOTED

## 2014-01-12 DIAGNOSIS — R269 Unspecified abnormalities of gait and mobility: Secondary | ICD-10-CM | POA: Diagnosis not present

## 2014-01-12 DIAGNOSIS — IMO0001 Reserved for inherently not codable concepts without codable children: Secondary | ICD-10-CM | POA: Diagnosis not present

## 2014-01-12 DIAGNOSIS — F3289 Other specified depressive episodes: Secondary | ICD-10-CM | POA: Diagnosis not present

## 2014-01-12 DIAGNOSIS — M4712 Other spondylosis with myelopathy, cervical region: Secondary | ICD-10-CM | POA: Diagnosis not present

## 2014-01-12 DIAGNOSIS — M069 Rheumatoid arthritis, unspecified: Secondary | ICD-10-CM | POA: Diagnosis not present

## 2014-01-12 DIAGNOSIS — I2510 Atherosclerotic heart disease of native coronary artery without angina pectoris: Secondary | ICD-10-CM | POA: Diagnosis not present

## 2014-01-12 DIAGNOSIS — F329 Major depressive disorder, single episode, unspecified: Secondary | ICD-10-CM | POA: Diagnosis not present

## 2014-01-12 LAB — BASIC METABOLIC PANEL
BUN: 12 mg/dL (ref 6–23)
CALCIUM: 9.5 mg/dL (ref 8.4–10.5)
CO2: 24 mEq/L (ref 19–32)
Chloride: 108 mEq/L (ref 96–112)
Creatinine, Ser: 0.7 mg/dL (ref 0.4–1.2)
GFR: 89.6 mL/min (ref >60.00)
GLUCOSE: 110 mg/dL — AB (ref 70–99)
POTASSIUM: 4.4 meq/L (ref 3.5–5.1)
Sodium: 140 mEq/L (ref 135–145)

## 2014-01-12 LAB — BRAIN NATRIURETIC PEPTIDE: PRO B NATRI PEPTIDE: 452 pg/mL — AB (ref 0.0–100.0)

## 2014-01-13 ENCOUNTER — Telehealth: Payer: Self-pay | Admitting: Cardiology

## 2014-01-13 MED ORDER — FUROSEMIDE 40 MG PO TABS: ORAL_TABLET | ORAL | Status: DC

## 2014-01-13 NOTE — Telephone Encounter (Signed)
Pt aware, meds updated.  

## 2014-01-13 NOTE — Telephone Encounter (Signed)
Message copied by Alcario Drought on Wed Jan 13, 2014 10:48 AM ------      Message from: Jettie Booze      Created: Tue Jan 12, 2014  4:20 PM       Lasix 40 mg daily x 3 days. OK to call in 30 day supply as she will likely need again in the future. Eat potassium rich foods. ------

## 2014-01-14 DIAGNOSIS — IMO0001 Reserved for inherently not codable concepts without codable children: Secondary | ICD-10-CM | POA: Diagnosis not present

## 2014-01-14 DIAGNOSIS — M069 Rheumatoid arthritis, unspecified: Secondary | ICD-10-CM | POA: Diagnosis not present

## 2014-01-14 DIAGNOSIS — R269 Unspecified abnormalities of gait and mobility: Secondary | ICD-10-CM | POA: Diagnosis not present

## 2014-01-14 DIAGNOSIS — M4712 Other spondylosis with myelopathy, cervical region: Secondary | ICD-10-CM | POA: Diagnosis not present

## 2014-01-14 DIAGNOSIS — I2510 Atherosclerotic heart disease of native coronary artery without angina pectoris: Secondary | ICD-10-CM | POA: Diagnosis not present

## 2014-01-14 DIAGNOSIS — K219 Gastro-esophageal reflux disease without esophagitis: Secondary | ICD-10-CM | POA: Diagnosis not present

## 2014-01-14 DIAGNOSIS — F3289 Other specified depressive episodes: Secondary | ICD-10-CM | POA: Diagnosis not present

## 2014-01-14 DIAGNOSIS — I10 Essential (primary) hypertension: Secondary | ICD-10-CM | POA: Diagnosis not present

## 2014-01-14 DIAGNOSIS — F329 Major depressive disorder, single episode, unspecified: Secondary | ICD-10-CM | POA: Diagnosis not present

## 2014-01-19 DIAGNOSIS — R269 Unspecified abnormalities of gait and mobility: Secondary | ICD-10-CM | POA: Diagnosis not present

## 2014-01-19 DIAGNOSIS — F329 Major depressive disorder, single episode, unspecified: Secondary | ICD-10-CM | POA: Diagnosis not present

## 2014-01-19 DIAGNOSIS — IMO0001 Reserved for inherently not codable concepts without codable children: Secondary | ICD-10-CM | POA: Diagnosis not present

## 2014-01-19 DIAGNOSIS — I2510 Atherosclerotic heart disease of native coronary artery without angina pectoris: Secondary | ICD-10-CM | POA: Diagnosis not present

## 2014-01-19 DIAGNOSIS — M4712 Other spondylosis with myelopathy, cervical region: Secondary | ICD-10-CM | POA: Diagnosis not present

## 2014-01-19 DIAGNOSIS — M069 Rheumatoid arthritis, unspecified: Secondary | ICD-10-CM | POA: Diagnosis not present

## 2014-01-19 DIAGNOSIS — F3289 Other specified depressive episodes: Secondary | ICD-10-CM | POA: Diagnosis not present

## 2014-01-21 ENCOUNTER — Telehealth: Payer: Self-pay

## 2014-01-21 DIAGNOSIS — M4712 Other spondylosis with myelopathy, cervical region: Secondary | ICD-10-CM | POA: Diagnosis not present

## 2014-01-21 DIAGNOSIS — F3289 Other specified depressive episodes: Secondary | ICD-10-CM | POA: Diagnosis not present

## 2014-01-21 DIAGNOSIS — M069 Rheumatoid arthritis, unspecified: Secondary | ICD-10-CM | POA: Diagnosis not present

## 2014-01-21 DIAGNOSIS — F329 Major depressive disorder, single episode, unspecified: Secondary | ICD-10-CM | POA: Diagnosis not present

## 2014-01-21 DIAGNOSIS — IMO0001 Reserved for inherently not codable concepts without codable children: Secondary | ICD-10-CM | POA: Diagnosis not present

## 2014-01-21 DIAGNOSIS — I2510 Atherosclerotic heart disease of native coronary artery without angina pectoris: Secondary | ICD-10-CM | POA: Diagnosis not present

## 2014-01-21 DIAGNOSIS — R269 Unspecified abnormalities of gait and mobility: Secondary | ICD-10-CM | POA: Diagnosis not present

## 2014-01-21 NOTE — Telephone Encounter (Signed)
Sherah (PT@ [email or web address]) is requesting a verbal order to extend patient's PT for 2x a week for 3 weeks. Is this okay?

## 2014-01-22 NOTE — Telephone Encounter (Signed)
Yes, that's ok.

## 2014-01-22 NOTE — Telephone Encounter (Signed)
Attempted to contact Carlock @ [facility] left a message on a vertified voicemail giving a verbal okay to extend patient's PT per Dr. Naaman Plummer.

## 2014-01-26 DIAGNOSIS — F329 Major depressive disorder, single episode, unspecified: Secondary | ICD-10-CM | POA: Diagnosis not present

## 2014-01-26 DIAGNOSIS — F3289 Other specified depressive episodes: Secondary | ICD-10-CM | POA: Diagnosis not present

## 2014-01-26 DIAGNOSIS — M069 Rheumatoid arthritis, unspecified: Secondary | ICD-10-CM | POA: Diagnosis not present

## 2014-01-26 DIAGNOSIS — R269 Unspecified abnormalities of gait and mobility: Secondary | ICD-10-CM | POA: Diagnosis not present

## 2014-01-26 DIAGNOSIS — M4712 Other spondylosis with myelopathy, cervical region: Secondary | ICD-10-CM | POA: Diagnosis not present

## 2014-01-26 DIAGNOSIS — I2510 Atherosclerotic heart disease of native coronary artery without angina pectoris: Secondary | ICD-10-CM | POA: Diagnosis not present

## 2014-01-26 DIAGNOSIS — IMO0001 Reserved for inherently not codable concepts without codable children: Secondary | ICD-10-CM | POA: Diagnosis not present

## 2014-01-28 ENCOUNTER — Other Ambulatory Visit: Payer: Self-pay | Admitting: *Deleted

## 2014-01-28 DIAGNOSIS — M4712 Other spondylosis with myelopathy, cervical region: Secondary | ICD-10-CM | POA: Diagnosis not present

## 2014-01-28 DIAGNOSIS — IMO0001 Reserved for inherently not codable concepts without codable children: Secondary | ICD-10-CM | POA: Diagnosis not present

## 2014-01-28 DIAGNOSIS — I2510 Atherosclerotic heart disease of native coronary artery without angina pectoris: Secondary | ICD-10-CM | POA: Diagnosis not present

## 2014-01-28 DIAGNOSIS — R269 Unspecified abnormalities of gait and mobility: Secondary | ICD-10-CM | POA: Diagnosis not present

## 2014-01-28 DIAGNOSIS — F329 Major depressive disorder, single episode, unspecified: Secondary | ICD-10-CM | POA: Diagnosis not present

## 2014-01-28 DIAGNOSIS — F3289 Other specified depressive episodes: Secondary | ICD-10-CM | POA: Diagnosis not present

## 2014-01-28 DIAGNOSIS — M069 Rheumatoid arthritis, unspecified: Secondary | ICD-10-CM | POA: Diagnosis not present

## 2014-01-28 MED ORDER — CLOPIDOGREL BISULFATE 75 MG PO TABS: 75.0000 mg | ORAL_TABLET | Freq: Every evening | ORAL | Status: DC

## 2014-02-01 ENCOUNTER — Encounter: Payer: Self-pay | Admitting: Physical Medicine & Rehabilitation

## 2014-02-01 ENCOUNTER — Encounter: Payer: Medicare Other | Attending: Physical Medicine and Rehabilitation | Admitting: Physical Medicine & Rehabilitation

## 2014-02-01 VITALS — BP 124/56 | HR 90 | Resp 14 | Ht 63.0 in | Wt 144.0 lb

## 2014-02-01 DIAGNOSIS — M4310 Spondylolisthesis, site unspecified: Secondary | ICD-10-CM | POA: Insufficient documentation

## 2014-02-01 DIAGNOSIS — L910 Hypertrophic scar: Secondary | ICD-10-CM | POA: Diagnosis not present

## 2014-02-01 DIAGNOSIS — I25810 Atherosclerosis of coronary artery bypass graft(s) without angina pectoris: Secondary | ICD-10-CM | POA: Diagnosis not present

## 2014-02-01 DIAGNOSIS — M069 Rheumatoid arthritis, unspecified: Secondary | ICD-10-CM | POA: Insufficient documentation

## 2014-02-01 DIAGNOSIS — M4316 Spondylolisthesis, lumbar region: Secondary | ICD-10-CM

## 2014-02-01 MED ORDER — LIDOCAINE 5 % EX PTCH: MEDICATED_PATCH | CUTANEOUS | Status: DC

## 2014-02-01 MED ORDER — OXYCODONE-ACETAMINOPHEN 7.5-325 MG PO TABS: 1.0000 | ORAL_TABLET | ORAL | Status: DC | PRN

## 2014-02-01 MED ORDER — DICLOFENAC SODIUM 1 % TD GEL: 2.0000 g | Freq: Two times a day (BID) | TRANSDERMAL | Status: DC | PRN

## 2014-02-01 NOTE — Progress Notes (Signed)
Subjective:    Patient ID: Angela [name], female    DOB: 08/21/1936, 78 y.o.   MRN: [id number]  HPI  Angela [name] is back regarding her chronic RA and associated pain problems. Her therapist has been very helpful with pain, ROM, balance, and strength. She feels that her pain is slowly subsiding although the loss of her husband still hits her hard. She tries to spend time with church and family and has a lot of family who are supportive.   She has an aid who is helping her from 6pm to 9pm each night.  She has her life alert now. She uses a walker around the house. She tries to practice good safety awareness. She's had no further falls.   Pain Inventory Average Pain 6 Pain Right Now 5 My pain is dull and aching  In the last 24 hours, has pain interfered with the following? General activity 8 Relation with others 8 Enjoyment of life 8 What TIME of day is your pain at its worst? morning Sleep (in general) Good  Pain is worse with: bending and standing Pain improves with: therapy/exercise and medication Relief from Meds: n/a  Mobility walk with assistance use a walker ability to climb steps?  no do you drive?  no use a wheelchair  Function disabled: date disabled . I need assistance with the following:  bathing, toileting, meal prep, household duties and shopping Do you have any goals in this area?  yes  Neuro/Psych bladder control problems trouble walking depression anxiety  Prior Studies fluid retention  Physicians involved in your care [name], Berna Bue, Rob [place], [place] Turner   Family History  Problem Relation Age of Onset  . Cancer Mother     lung cancer  . Cancer Brother     melanoma   History   Social History  . Marital Status: Married    Spouse Name: N/A    Number of Children: N/A  . Years of Education: N/A   Social History Main Topics  . Smoking status: Never Smoker   . Smokeless tobacco: Never Used  . Alcohol Use: No  . Drug  Use: No  . Sexual Activity: None   Other Topics Concern  . None   Social History Narrative  . None   Past Surgical History  Procedure Laterality Date  . Fracture surgery  09/2011    left ankle screw and pin removed  . Cardiac catheterization     Past Medical History  Diagnosis Date  . Cervical stenosis of spine   . Rheumatoid arthritis(714.0)   . Spondylolisthesis of lumbar region   . Hypertension   . A-fib   . CAD (coronary artery disease)     s/p PCI with BMS to mid LAD--2/08 not on aspirin due to frequent ulcers  . Asthma   . Depression   . Dyslipidemia   . GERD (gastroesophageal reflux disease)   . Obesity   . PVC's (premature ventricular contractions)   . PUD (peptic ulcer disease)     can not take aspirin  . Osteoporosis     alendronate since 2012  . Macular degeneration   . Aortic stenosis     mild by echo 03/2012   BP 124/56  Pulse 90  Resp 14  Ht 5\' 3"  (1.6 m)  Wt 144 lb (65.318 kg)  BMI 25.51 kg/m2  SpO2 96%  Opioid Risk Score:   Fall Risk Score: Moderate Fall Risk (6-13 points) (patient educated handout declined)  Review of Systems  Cardiovascular: Positive for leg swelling.  Genitourinary: Positive for difficulty urinating.  Musculoskeletal: Positive for back pain and gait problem.  Hematological: Bruises/bleeds easily.  Psychiatric/Behavioral: Positive for dysphoric mood. The patient is nervous/anxious.   All other systems reviewed and are negative.       Objective:   Physical Exam She ambulated with the rolling walker. She takes longer steps and is more stable. Changed directions well.  She can extend elbow to -30 degrees. Her cervical ROM appears at baseline.  Neurological: She has normal reflexes. No cranial nerve deficit or sensory deficit.  Motor function appears near baseline. Overall dynamic balance much improved.   Psychiatric: She has a normal mood and affect. Her behavior is normal. Judgment and thought content normal.     Assessment & Plan:   ASSESSMENT:  1. History of cervical stenosis/spondylosis with myelopathy.  2. Rheumatoid arthritis.  3. Lumbar spondylolisthesis.  4. Gait disorder related to the above, recent fall with renal failure.    PLAN:  1. She was given one prescription for Percocet 7.5/325 1 p.o. q.6  hours p.r.n., 120. A second rx was given for next month. 2. Continue with lidoderm and voltaren gel. lidoderm was reordered.  3. She looks good today. Continue with her walker and HEP---she has made nice progress 4. Discussed a mental check list for safety at home.

## 2014-02-01 NOTE — Patient Instructions (Signed)
PLEASE CALL ME WITH ANY PROBLEMS OR QUESTIONS (#[phone]).     CONTINUE TO THINK ABOUT SAFETY AT HOME WHILE MAINTAINING A LEVEL OF ACTIVITY!

## 2014-02-02 DIAGNOSIS — F3289 Other specified depressive episodes: Secondary | ICD-10-CM | POA: Diagnosis not present

## 2014-02-02 DIAGNOSIS — M4712 Other spondylosis with myelopathy, cervical region: Secondary | ICD-10-CM | POA: Diagnosis not present

## 2014-02-02 DIAGNOSIS — I2510 Atherosclerotic heart disease of native coronary artery without angina pectoris: Secondary | ICD-10-CM | POA: Diagnosis not present

## 2014-02-02 DIAGNOSIS — R269 Unspecified abnormalities of gait and mobility: Secondary | ICD-10-CM | POA: Diagnosis not present

## 2014-02-02 DIAGNOSIS — F329 Major depressive disorder, single episode, unspecified: Secondary | ICD-10-CM | POA: Diagnosis not present

## 2014-02-02 DIAGNOSIS — M069 Rheumatoid arthritis, unspecified: Secondary | ICD-10-CM | POA: Diagnosis not present

## 2014-02-02 DIAGNOSIS — IMO0001 Reserved for inherently not codable concepts without codable children: Secondary | ICD-10-CM | POA: Diagnosis not present

## 2014-02-04 DIAGNOSIS — I2510 Atherosclerotic heart disease of native coronary artery without angina pectoris: Secondary | ICD-10-CM | POA: Diagnosis not present

## 2014-02-04 DIAGNOSIS — F329 Major depressive disorder, single episode, unspecified: Secondary | ICD-10-CM | POA: Diagnosis not present

## 2014-02-04 DIAGNOSIS — IMO0001 Reserved for inherently not codable concepts without codable children: Secondary | ICD-10-CM | POA: Diagnosis not present

## 2014-02-04 DIAGNOSIS — M4712 Other spondylosis with myelopathy, cervical region: Secondary | ICD-10-CM | POA: Diagnosis not present

## 2014-02-04 DIAGNOSIS — M069 Rheumatoid arthritis, unspecified: Secondary | ICD-10-CM | POA: Diagnosis not present

## 2014-02-04 DIAGNOSIS — R269 Unspecified abnormalities of gait and mobility: Secondary | ICD-10-CM | POA: Diagnosis not present

## 2014-02-04 DIAGNOSIS — F3289 Other specified depressive episodes: Secondary | ICD-10-CM | POA: Diagnosis not present

## 2014-02-09 DIAGNOSIS — M069 Rheumatoid arthritis, unspecified: Secondary | ICD-10-CM | POA: Diagnosis not present

## 2014-02-09 DIAGNOSIS — F3289 Other specified depressive episodes: Secondary | ICD-10-CM | POA: Diagnosis not present

## 2014-02-09 DIAGNOSIS — M4712 Other spondylosis with myelopathy, cervical region: Secondary | ICD-10-CM | POA: Diagnosis not present

## 2014-02-09 DIAGNOSIS — I2510 Atherosclerotic heart disease of native coronary artery without angina pectoris: Secondary | ICD-10-CM | POA: Diagnosis not present

## 2014-02-09 DIAGNOSIS — R269 Unspecified abnormalities of gait and mobility: Secondary | ICD-10-CM | POA: Diagnosis not present

## 2014-02-09 DIAGNOSIS — F329 Major depressive disorder, single episode, unspecified: Secondary | ICD-10-CM | POA: Diagnosis not present

## 2014-02-09 DIAGNOSIS — IMO0001 Reserved for inherently not codable concepts without codable children: Secondary | ICD-10-CM | POA: Diagnosis not present

## 2014-02-11 DIAGNOSIS — M069 Rheumatoid arthritis, unspecified: Secondary | ICD-10-CM | POA: Diagnosis not present

## 2014-02-11 DIAGNOSIS — IMO0001 Reserved for inherently not codable concepts without codable children: Secondary | ICD-10-CM | POA: Diagnosis not present

## 2014-02-11 DIAGNOSIS — F329 Major depressive disorder, single episode, unspecified: Secondary | ICD-10-CM | POA: Diagnosis not present

## 2014-02-11 DIAGNOSIS — F3289 Other specified depressive episodes: Secondary | ICD-10-CM | POA: Diagnosis not present

## 2014-02-11 DIAGNOSIS — R269 Unspecified abnormalities of gait and mobility: Secondary | ICD-10-CM | POA: Diagnosis not present

## 2014-02-11 DIAGNOSIS — M4712 Other spondylosis with myelopathy, cervical region: Secondary | ICD-10-CM | POA: Diagnosis not present

## 2014-02-11 DIAGNOSIS — I2510 Atherosclerotic heart disease of native coronary artery without angina pectoris: Secondary | ICD-10-CM | POA: Diagnosis not present

## 2014-02-18 ENCOUNTER — Ambulatory Visit (INDEPENDENT_AMBULATORY_CARE_PROVIDER_SITE_OTHER): Payer: Medicare Other | Admitting: Cardiology

## 2014-02-18 ENCOUNTER — Encounter: Payer: Self-pay | Admitting: Cardiology

## 2014-02-18 VITALS — BP 100/58 | HR 54 | Ht 63.0 in | Wt 143.8 lb

## 2014-02-18 DIAGNOSIS — I10 Essential (primary) hypertension: Secondary | ICD-10-CM | POA: Diagnosis not present

## 2014-02-18 DIAGNOSIS — I2510 Atherosclerotic heart disease of native coronary artery without angina pectoris: Secondary | ICD-10-CM | POA: Diagnosis not present

## 2014-02-18 DIAGNOSIS — I493 Ventricular premature depolarization: Secondary | ICD-10-CM

## 2014-02-18 DIAGNOSIS — I4949 Other premature depolarization: Secondary | ICD-10-CM

## 2014-02-18 DIAGNOSIS — E785 Hyperlipidemia, unspecified: Secondary | ICD-10-CM

## 2014-02-18 DIAGNOSIS — I359 Nonrheumatic aortic valve disorder, unspecified: Secondary | ICD-10-CM

## 2014-02-18 DIAGNOSIS — R600 Localized edema: Secondary | ICD-10-CM | POA: Insufficient documentation

## 2014-02-18 DIAGNOSIS — R609 Edema, unspecified: Secondary | ICD-10-CM

## 2014-02-18 DIAGNOSIS — I350 Nonrheumatic aortic (valve) stenosis: Secondary | ICD-10-CM | POA: Insufficient documentation

## 2014-02-18 LAB — LIPID PANEL
CHOL/HDL RATIO: 2
Cholesterol: 140 mg/dL (ref 0–200)
HDL: 62.2 mg/dL (ref >39.00)
LDL CALC: 59 mg/dL (ref 0–99)
TRIGLYCERIDES: 95 mg/dL (ref 0.0–149.0)
VLDL: 19 mg/dL (ref 0.0–40.0)

## 2014-02-18 LAB — ALT: ALT: 13 U/L (ref 0–35)

## 2014-02-18 NOTE — Progress Notes (Signed)
[place], [address] [place], [place]  [address] Phone: [phone] Fax:  [phone]  Date:  02/18/2014   ID:  Angela [name], DOB 04/28/1936, MRN [id number]  PCP:  Irven Shelling, MD  Cardiologist:  Fransico Him, MD     History of Present Illness: Angela [name] is a 78 y.o. female with a history of ASCAD, dyslipidemia, PVC's and HTN presents today for followup.  She is doing well.  She denies any chest pain, dizziness, palpitations or syncope.  She says that she has been having some LE edema in her ankles.  Since I saw her last she had fallen and had to go to rehab for a while.  She uses a walker to ambulate.  She had some palpitations while at [facility] and was placed on Toprol which helped significantly.  Last month she had some SOB and LE edema and was seen by her PCP and was placed on Lasix which has helped.  She still has some LE edema but not as bad.  Her SOB has improved on diuretics.   Wt Readings from Last 3 Encounters:  02/18/14 143 lb 12.8 oz (65.227 kg)  02/01/14 144 lb (65.318 kg)  01/11/14 144 lb (65.318 kg)     Past Medical History  Diagnosis Date  . Cervical stenosis of spine   . Rheumatoid arthritis(714.0)   . Spondylolisthesis of lumbar region   . A-fib   . Asthma   . Depression   . Dyslipidemia   . GERD (gastroesophageal reflux disease)   . Obesity   . PVC's (premature ventricular contractions)   . PUD (peptic ulcer disease)     can not take aspirin  . Osteoporosis     alendronate since 2012  . Macular degeneration   . Aortic stenosis     mild by echo 03/2012  . Hypertension   . CAD (coronary artery disease)     s/p PCI with BMS to mid LAD--2/08 not on aspirin due to frequent ulcers.  Repeat cath for CP 09/2012 with patent LAD stent and otherwise normal coronary arteries    Current Outpatient Prescriptions  Medication Sig Dispense Refill  . butalbital-acetaminophen-caffeine (FIORICET, ESGIC) 50-325-40 MG per tablet Take 1 tablet by mouth as  needed. For migraines      . Calcium Carbonate-Vitamin D (CALCIUM 500+D PO) Take 1 tablet by mouth 3 (three) times daily.      . citalopram (CELEXA) 20 MG tablet TAKE 1 TABLET DAILY  90 tablet  2  . clopidogrel (PLAVIX) 75 MG tablet Take 1 tablet (75 mg total) by mouth every evening.  90 tablet  0  . diclofenac sodium (VOLTAREN) 1 % GEL Apply 2 g topically 2 (two) times daily as needed. For pain  2 Tube  5  . EPIPEN 2-PAK 0.3 MG/0.3ML SOAJ Inject 0.3 mg into the muscle daily as needed. For allergic reaction      . folic acid (FOLVITE) 1 MG tablet       . furosemide (LASIX) 40 MG tablet Take 40 mg by mouth as needed.      . ibandronate (BONIVA) 150 MG tablet Take 150 mg by mouth every 30 (thirty) days.       Marland Kitchen lidocaine (LIDODERM) 5 % APPLY 1 PATCH ONTO THE SKIN DAILY REMOVE AND DISCARD PATCH WITHIN 12 HOURS OR AS DIRECTED BY PRESCRIBER  90 patch  5  . LORazepam (ATIVAN) 1 MG tablet Take 2 mg by mouth at bedtime.      Marland Kitchen  methotrexate (RHEUMATREX) 2.5 MG tablet Take 6 tablets by mouth once a week. On Saturdays      . metoprolol succinate (TOPROL-XL) 25 MG 24 hr tablet Take 25 mg by mouth daily.      Marland Kitchen NITROSTAT 0.4 MG SL tablet Place 0.4 mg under the tongue every 5 (five) minutes as needed. For chest pain      . oxyCODONE-acetaminophen (PERCOCET) 7.5-325 MG per tablet Take 1 tablet by mouth every 4 (four) hours as needed for pain.  120 tablet  0  . pantoprazole (PROTONIX) 40 MG tablet Take 40 mg by mouth daily.      . predniSONE (DELTASONE) 5 MG tablet Take 5 mg by mouth daily.      Marland Kitchen PREVIDENT 0.2 % SOLN Take by mouth at bedtime. Rinses with about 1 capful nightly      . simvastatin (ZOCOR) 40 MG tablet Take 40 mg by mouth at bedtime.       . tolterodine (DETROL LA) 4 MG 24 hr capsule Take 4 mg by mouth daily.        No current facility-administered medications for this visit.    Allergies:    Allergies  Allergen Reactions  . Peanut-Containing Drug Products Anaphylaxis  . Reglan  [Metoclopramide] Other (See Comments)    "Messed my mind up"  . Codeine Nausea Only  . Iohexol Swelling     Desc: pt received hypaque 60% in 1994 and had erythema, hives & lips swell.  She did ok w/o premeds in 8/05 at [facility].  Pt. takes daily prednisone for rheumatoid arthritis.  Poor venous access today (7/7/6) so we did her w/o iv contrast.   . Shellfish Allergy Swelling    Lips swell    Social History:  The patient  reports that she has never smoked. She has never used smokeless tobacco. She reports that she does not drink alcohol or use illicit drugs.   Family History:  The patient's family history includes Cancer in her brother and mother.   ROS:  Please see the history of present illness.      All other systems reviewed and negative.   PHYSICAL EXAM: VS:  Ht 5\' 3"  (1.6 m)  Wt 143 lb 12.8 oz (65.227 kg)  BMI 25.48 kg/m2 Well nourished, well developed, in no acute distress HEENT: normal Neck: no JVD Cardiac:  normal S1, S2; RRR; no murmur Lungs:  clear to auscultation bilaterally, no wheezing, rhonchi or rales Abd: soft, nontender, no hepatomegaly Ext: no edema Skin: warm and dry Neuro:  CNs 2-12 intact, no focal abnormalities noted  EKG:     Sinus bradycardia at 59bpm  with PVC's  ASSESSMENT AND PLAN:  1. ASCAD with no angina - continue Plavix  2. HTN well controlled 3. Dyslipidemia - continue Zocor - recheck fasting lipid panel and ALT 4.  Mild AS - recheck 2D echo for progression (last echo 2013) 5.  LE edema - resolved after Lasix - continue Lasix PRN 4. PVC's asymptomatic - continue Toprol 5.  SOB resolved after treatment with Lasix and Toprol.  She is now on Lasix only as needed and rarely has to take it.   Followup with me in 6 months  Signed, Fransico Him, MD 02/18/2014 1:33 PM

## 2014-02-18 NOTE — Patient Instructions (Signed)
Your physician has requested that you have an echocardiogram. Echocardiography is a painless test that uses sound waves to create images of your heart. It provides your doctor with information about the size and shape of your heart and how well your heart's chambers and valves are working. This procedure takes approximately one hour. There are no restrictions for this procedure.  Your physician recommends that you return for a FASTING lipid profile: ALT  Your physician wants you to follow-up in: 6 months with DR. Radford Pax You will receive a reminder letter in the mail two months in advance. If you don't receive a letter, please call our office to schedule the follow-up appointment.  Your physician recommends that you continue on your current medications as directed. Please refer to the Current Medication list given to you today.

## 2014-02-19 ENCOUNTER — Other Ambulatory Visit: Payer: Self-pay | Admitting: General Surgery

## 2014-02-19 ENCOUNTER — Encounter: Payer: Self-pay | Admitting: General Surgery

## 2014-02-19 DIAGNOSIS — E785 Hyperlipidemia, unspecified: Secondary | ICD-10-CM

## 2014-03-01 ENCOUNTER — Ambulatory Visit (HOSPITAL_COMMUNITY): Payer: Medicare Other | Attending: Internal Medicine | Admitting: Cardiology

## 2014-03-01 ENCOUNTER — Encounter: Payer: Self-pay | Admitting: Internal Medicine

## 2014-03-01 DIAGNOSIS — I359 Nonrheumatic aortic valve disorder, unspecified: Secondary | ICD-10-CM | POA: Insufficient documentation

## 2014-03-01 DIAGNOSIS — I350 Nonrheumatic aortic (valve) stenosis: Secondary | ICD-10-CM

## 2014-03-01 DIAGNOSIS — I2510 Atherosclerotic heart disease of native coronary artery without angina pectoris: Secondary | ICD-10-CM

## 2014-03-01 NOTE — Progress Notes (Signed)
Echo performed. 

## 2014-03-03 DIAGNOSIS — M069 Rheumatoid arthritis, unspecified: Secondary | ICD-10-CM | POA: Diagnosis not present

## 2014-03-03 DIAGNOSIS — R3915 Urgency of urination: Secondary | ICD-10-CM | POA: Diagnosis not present

## 2014-03-03 DIAGNOSIS — Z79899 Other long term (current) drug therapy: Secondary | ICD-10-CM | POA: Diagnosis not present

## 2014-03-03 DIAGNOSIS — M2550 Pain in unspecified joint: Secondary | ICD-10-CM | POA: Diagnosis not present

## 2014-03-04 ENCOUNTER — Ambulatory Visit: Payer: Medicare Other | Admitting: Interventional Cardiology

## 2014-03-05 ENCOUNTER — Other Ambulatory Visit: Payer: Self-pay | Admitting: General Surgery

## 2014-03-05 DIAGNOSIS — I350 Nonrheumatic aortic (valve) stenosis: Secondary | ICD-10-CM

## 2014-03-08 ENCOUNTER — Other Ambulatory Visit: Payer: Self-pay

## 2014-03-08 DIAGNOSIS — M069 Rheumatoid arthritis, unspecified: Secondary | ICD-10-CM

## 2014-03-08 DIAGNOSIS — M4316 Spondylolisthesis, lumbar region: Secondary | ICD-10-CM

## 2014-03-08 MED ORDER — LIDOCAINE 5 % EX PTCH: MEDICATED_PATCH | CUTANEOUS | Status: DC

## 2014-03-31 DIAGNOSIS — Z961 Presence of intraocular lens: Secondary | ICD-10-CM | POA: Diagnosis not present

## 2014-04-05 ENCOUNTER — Encounter: Payer: Self-pay | Admitting: Registered Nurse

## 2014-04-05 ENCOUNTER — Encounter: Payer: Medicare Other | Attending: Physical Medicine and Rehabilitation | Admitting: Registered Nurse

## 2014-04-05 ENCOUNTER — Other Ambulatory Visit: Payer: Self-pay | Admitting: *Deleted

## 2014-04-05 VITALS — BP 112/58 | HR 62 | Resp 16 | Ht 63.0 in | Wt 144.0 lb

## 2014-04-05 DIAGNOSIS — I2510 Atherosclerotic heart disease of native coronary artery without angina pectoris: Secondary | ICD-10-CM

## 2014-04-05 DIAGNOSIS — M4316 Spondylolisthesis, lumbar region: Secondary | ICD-10-CM

## 2014-04-05 DIAGNOSIS — M069 Rheumatoid arthritis, unspecified: Secondary | ICD-10-CM | POA: Diagnosis not present

## 2014-04-05 DIAGNOSIS — Z5181 Encounter for therapeutic drug level monitoring: Secondary | ICD-10-CM | POA: Diagnosis not present

## 2014-04-05 DIAGNOSIS — M4310 Spondylolisthesis, site unspecified: Secondary | ICD-10-CM | POA: Insufficient documentation

## 2014-04-05 DIAGNOSIS — Z79899 Other long term (current) drug therapy: Secondary | ICD-10-CM | POA: Diagnosis not present

## 2014-04-05 MED ORDER — SIMVASTATIN 40 MG PO TABS: 40.0000 mg | ORAL_TABLET | Freq: Every day | ORAL | Status: DC

## 2014-04-05 MED ORDER — OXYCODONE-ACETAMINOPHEN 7.5-325 MG PO TABS: 1.0000 | ORAL_TABLET | ORAL | Status: DC | PRN

## 2014-04-05 NOTE — Progress Notes (Signed)
Subjective:    Patient ID: Angela [name], female    DOB: May 11, 1936, 78 y.o.   MRN: [id number]  HPI: [name] is a 78 year old female who returns for follow up for chronic pain and medication refill. She says her pain is located in her hands. She rates her pain 7. She states: her pain is usually worse in the morning's she takes her pain medication and uses the heating pad, after a few hours she is able to get her day started. She becomes very euphoric when talking about her caregiver she is 76 years old and comes to her house Monday - Friday 6-9 pm. She brings her cook meals and she is very grateful. Her brother 26 with her on Tuesday nights and she looks forward to the cooked breakfast on Wednesday mornings. Her current exercise regime is walking with the walker in her home.  Pain Inventory Average Pain 6 Pain Right Now 7 My pain is burning, dull and aching  In the last 24 hours, has pain interfered with the following? General activity 8 Relation with others 8 Enjoyment of life 8 What TIME of day is your pain at its worst? morning Sleep (in general) Good  Pain is worse with: walking, bending and standing Pain improves with: medication Relief from Meds: 8  Mobility use a walker how many minutes can you walk? 5-10 ability to climb steps?  no do you drive?  no use a wheelchair needs help with transfers Do you have any goals in this area?  yes  Function retired I need assistance with the following:  bathing, toileting, meal prep, household duties and shopping  Neuro/Psych bladder control problems bowel control problems trouble walking anxiety  Prior Studies Any changes since last visit?  no  Physicians involved in your care Any changes since last visit?  no   Family History  Problem Relation Age of Onset  . Cancer Mother     lung cancer  . Cancer Brother     melanoma   History   Social History  . Marital Status: Married    Spouse Name: N/A    Number of Children: N/A  . Years of Education: N/A   Social History Main Topics  . Smoking status: Never Smoker   . Smokeless tobacco: Never Used  . Alcohol Use: No  . Drug Use: No  . Sexual Activity: None   Other Topics Concern  . None   Social History Narrative  . None   Past Surgical History  Procedure Laterality Date  . Fracture surgery  09/2011    left ankle screw and pin removed  . Cardiac catheterization  09/2012    patent LAD stent and otherwise normal coronary arteries   Past Medical History  Diagnosis Date  . Cervical stenosis of spine   . Rheumatoid arthritis(714.0)   . Spondylolisthesis of lumbar region   . A-fib   . Asthma   . Depression   . Dyslipidemia   . GERD (gastroesophageal reflux disease)   . Obesity   . PVC's (premature ventricular contractions)   . PUD (peptic ulcer disease)     can not take aspirin  . Osteoporosis     alendronate since 2012  . Macular degeneration   . Aortic stenosis     mild by echo 03/2012  . Hypertension   . CAD (coronary artery disease)     s/p PCI with BMS to mid LAD--2/08 not on aspirin due to frequent  ulcers.  Repeat cath for CP 09/2012 with patent LAD stent and otherwise normal coronary arteries   BP 112/58  Pulse 62  Resp 16  Ht 5\' 3"  (1.6 m)  Wt 144 lb (65.318 kg)  BMI 25.51 kg/m2  SpO2 98%  Opioid Risk Score:   Fall Risk Score: Moderate Fall Risk (6-13 points) (patient educated handout declined)    Review of Systems  Cardiovascular: Positive for leg swelling.  Genitourinary: Positive for difficulty urinating.  Musculoskeletal: Positive for back pain and gait problem.  Hematological: Bruises/bleeds easily.  Psychiatric/Behavioral: The patient is nervous/anxious.   All other systems reviewed and are negative.      Objective:   Physical Exam  Nursing note and vitals reviewed. Constitutional: She is oriented to person, place, and time. She appears well-developed and well-nourished.  HENT:  Head:  Normocephalic and atraumatic.  Neck: Normal range of motion. Neck supple.  Cervical Paraspinal Tenderness noted: C-3- C-5  Cardiovascular: Normal rate and regular rhythm.   Pulmonary/Chest: Effort normal and breath sounds normal.  Musculoskeletal:  Normal Muscle Bulk and Muscle Testing Reveals: Upper Extremities: Decreased ROM 45 degrees and Muscle strength on right 3/5 and Left 4/5 Spine flexion 35 degrees Lower Extremities: Full ROM and Muscle Strength on right 4/5 and Left 5/5 Arises from chair with ease. Uses a walker   Neurological: She is alert and oriented to person, place, and time.  Skin: Skin is warm and dry.  Left Lower extremity with +1 pitting edema noted  Psychiatric: She has a normal mood and affect.          Assessment & Plan:  1. History of cervical stenosis/spondylosis with myelopathy:  Refilled: Oxycodone 7.5/325mg  one tablet every 4 hours as needed for pain #120. 2. Rheumatoid arthritis. Uses the Lidocaine Patches and alternates with Voltaren gel. Continue to Monitor 3. Lumbar spondylolisthesis. Continue with exercise and heat therapy.   20 minutes of face to face patient care time was spent during this visit. All questions were encouraged and answered.  F/U in 2 months

## 2014-04-06 ENCOUNTER — Other Ambulatory Visit: Payer: Self-pay

## 2014-04-06 MED ORDER — FUROSEMIDE 40 MG PO TABS: 40.0000 mg | ORAL_TABLET | ORAL | Status: DC | PRN

## 2014-04-22 DIAGNOSIS — R3915 Urgency of urination: Secondary | ICD-10-CM | POA: Diagnosis not present

## 2014-04-26 ENCOUNTER — Telehealth: Payer: Self-pay | Admitting: *Deleted

## 2014-04-26 MED ORDER — OXYCODONE-ACETAMINOPHEN 7.5-325 MG PO TABS: 1.0000 | ORAL_TABLET | ORAL | Status: DC | PRN

## 2014-04-26 NOTE — Telephone Encounter (Signed)
Angela [name] called and said she has misplaced her rx for oxycodone and she needs it.  She has never misplaced a prescription before.  In looking back at the medications it looks like she did not get a second rx at the 04/05/14 appt.  Her rx was for #120 but can take q 4hr prn on  04/05/14. Can we print new rx?

## 2014-04-26 NOTE — Telephone Encounter (Signed)
Printed for [employer] to sign.  Ms Angela [name] notified. Cautioned not to take more than 4 per day as she is only given #120 for the month.

## 2014-05-11 ENCOUNTER — Other Ambulatory Visit: Payer: Self-pay | Admitting: Cardiology

## 2014-06-02 DIAGNOSIS — R3915 Urgency of urination: Secondary | ICD-10-CM | POA: Diagnosis not present

## 2014-06-02 DIAGNOSIS — M2550 Pain in unspecified joint: Secondary | ICD-10-CM | POA: Diagnosis not present

## 2014-06-02 DIAGNOSIS — M069 Rheumatoid arthritis, unspecified: Secondary | ICD-10-CM | POA: Diagnosis not present

## 2014-06-02 DIAGNOSIS — Z79899 Other long term (current) drug therapy: Secondary | ICD-10-CM | POA: Diagnosis not present

## 2014-06-07 ENCOUNTER — Telehealth: Payer: Self-pay | Admitting: Cardiology

## 2014-06-07 ENCOUNTER — Encounter: Payer: Medicare Other | Attending: Physical Medicine and Rehabilitation | Admitting: Registered Nurse

## 2014-06-07 ENCOUNTER — Encounter: Payer: Self-pay | Admitting: Registered Nurse

## 2014-06-07 VITALS — BP 135/73 | HR 51 | Resp 14 | Ht 63.0 in | Wt 144.0 lb

## 2014-06-07 DIAGNOSIS — Z79899 Other long term (current) drug therapy: Secondary | ICD-10-CM

## 2014-06-07 DIAGNOSIS — M4316 Spondylolisthesis, lumbar region: Secondary | ICD-10-CM

## 2014-06-07 DIAGNOSIS — M4310 Spondylolisthesis, site unspecified: Secondary | ICD-10-CM | POA: Diagnosis not present

## 2014-06-07 DIAGNOSIS — I2510 Atherosclerotic heart disease of native coronary artery without angina pectoris: Secondary | ICD-10-CM | POA: Diagnosis not present

## 2014-06-07 DIAGNOSIS — Z5181 Encounter for therapeutic drug level monitoring: Secondary | ICD-10-CM

## 2014-06-07 DIAGNOSIS — M069 Rheumatoid arthritis, unspecified: Secondary | ICD-10-CM | POA: Insufficient documentation

## 2014-06-07 MED ORDER — OXYCODONE-ACETAMINOPHEN 7.5-325 MG PO TABS: 1.0000 | ORAL_TABLET | ORAL | Status: DC | PRN

## 2014-06-07 NOTE — Telephone Encounter (Signed)
New Prob    States HR has been low during today's visit. HR was also low during last visit with Dr. Radford Pax. Calling regarding possible medication changes and/or how to move forward with pt. Please call.

## 2014-06-07 NOTE — Telephone Encounter (Signed)
Called the NP and made aware. TO Dr Radford Pax to advise if pt should have any changes in medications. Pt is aware Dr Radford Pax is on vacation this week and might not respond until next week.

## 2014-06-07 NOTE — Telephone Encounter (Signed)
Spoke with Dr Tamala Julian, Since pt has history of lower HR and BP and Dr Radford Pax did not make changes at last office visit, that he felt she was ok to go home since she's asymptomatic.

## 2014-06-07 NOTE — Progress Notes (Signed)
Subjective:    Patient ID: Angela [name], female    DOB: [birth year], 78 y.o.   MRN: [id number]  HPI: Angela [name] is a 78 year old female who returns for follow up for chronic pain and medication refill. She says her pain is located in her lower back. She rates her pain 8.  Her current exercise regime is walking with the walker in her home.  She arrived to the clinic bradycardiac and asymptomatic. Apical pulse checked 55. At the end of assessment Heart Rate checked on automatic machine. Heart rate 40, checked apical again 58. Reviewed epic. She seen her cardiologist Dr. Radford Pax in April 2015, it was in the 50's. She had an EKG and ECHO ordered. Medications reviewed on Metoprolol ER and Lasix. I called Dr. Radford Pax office and the [place] spoke with Dr. Daneen Schick and stated: she can go home. They will relay this information to Dr. Radford Pax. Angela [name] was instructed if she becomes symptomatic call 911. They verbalized understanding.  Pain Inventory Average Pain 6 Pain Right Now 8 My pain is na  In the last 24 hours, has pain interfered with the following? General activity 8 Relation with others 8 Enjoyment of life 8 What TIME of day is your pain at its worst? morning Sleep (in general) Good  Pain is worse with: walking, bending and standing Pain improves with: rest, heat/ice and medication Relief from Meds: 7  Mobility walk with assistance use a walker ability to climb steps?  no do you drive?  no use a wheelchair  Function disabled: date disabled na I need assistance with the following:  dressing, bathing, toileting, meal prep, household duties and shopping  Neuro/Psych bladder control problems numbness trouble walking anxiety  Prior Studies Any changes since last visit?  no  Physicians involved in your care Any changes since last visit?  no   Family History  Problem Relation Age of Onset  . Cancer Mother     lung cancer  . Cancer Brother    melanoma   History   Social History  . Marital Status: Married    Spouse Name: N/A    Number of Children: N/A  . Years of Education: N/A   Social History Main Topics  . Smoking status: Never Smoker   . Smokeless tobacco: Never Used  . Alcohol Use: No  . Drug Use: No  . Sexual Activity: None   Other Topics Concern  . None   Social History Narrative  . None   Past Surgical History  Procedure Laterality Date  . Fracture surgery  09/2011    left ankle screw and pin removed  . Cardiac catheterization  09/2012    patent LAD stent and otherwise normal coronary arteries   Past Medical History  Diagnosis Date  . Cervical stenosis of spine   . Rheumatoid arthritis(714.0)   . Spondylolisthesis of lumbar region   . A-fib   . Asthma   . Depression   . Dyslipidemia   . GERD (gastroesophageal reflux disease)   . Obesity   . PVC's (premature ventricular contractions)   . PUD (peptic ulcer disease)     can not take aspirin  . Osteoporosis     alendronate since 2012  . Macular degeneration   . Aortic stenosis     mild by echo 03/2012  . Hypertension   . CAD (coronary artery disease)     s/p PCI with BMS to mid LAD--2/08 not on  aspirin due to frequent ulcers.  Repeat cath for CP 09/2012 with patent LAD stent and otherwise normal coronary arteries   BP 135/73  Pulse 51  Resp 14  Ht 5\' 3"  (1.6 m)  Wt 144 lb (65.318 kg)  BMI 25.51 kg/m2  SpO2 98%  Opioid Risk Score:   Fall Risk Score: Moderate Fall Risk (6-13 points) (pt educated on fall risk, brochure given to pt previously)    Review of Systems  Cardiovascular: Positive for leg swelling.  Genitourinary:       Bladder control problems  Musculoskeletal: Positive for back pain and gait problem.  Neurological: Positive for numbness.  Hematological: Bruises/bleeds easily.  Psychiatric/Behavioral: The patient is nervous/anxious.   All other systems reviewed and are negative.      Objective:   Physical Exam    Nursing note and vitals reviewed. Constitutional: She is oriented to person, place, and time. She appears well-developed and well-nourished.  HENT:  Head: Normocephalic and atraumatic.  Neck: Normal range of motion. Neck supple.  Cervical Paraspinal Tenderness: C-3- C-5  Cardiovascular:  Murmur heard. [id number]  Pulmonary/Chest: Effort normal and breath sounds normal.  Musculoskeletal:  Normal Muscle Bulk and Muscle Testing Reveals: Upper Extremities: Full ROM and Muscle Strength 4/5 Spinal Forward Flexion 45 Degrees and Extension 10 Degrees  Lumbar Paraspinal Tenderness: L-3- L-5 Lower Extremities: Full ROM and Muscle strength 5/5 Arises from chair with ease/ Narrow Based gait Walker for support  Neurological: She is alert and oriented to person, place, and time.  Skin: Skin is warm and dry.  Psychiatric: She has a normal mood and affect.          Assessment & Plan:  1. History of cervical stenosis/spondylosis with myelopathy:  Refilled: Oxycodone 7.5/325mg  one tablet every 4 hours as needed for pain #120.  2. Rheumatoid arthritis. Uses the Lidocaine Patches and alternates with Voltaren gel. Continue to Monitor  3. Lumbar spondylolisthesis. Continue with exercise and heat therapy.   60 minutes of face to face patient care time was spent during this visit. All questions were encouraged and answered.   F/U in 2 months

## 2014-06-09 NOTE — Telephone Encounter (Signed)
Please get a 24 hour Holter to assess average HR

## 2014-06-10 ENCOUNTER — Encounter: Payer: Self-pay | Admitting: Physician Assistant

## 2014-06-10 ENCOUNTER — Ambulatory Visit (INDEPENDENT_AMBULATORY_CARE_PROVIDER_SITE_OTHER): Payer: Medicare Other | Admitting: Physician Assistant

## 2014-06-10 VITALS — BP 120/80 | HR 61 | Ht 63.0 in | Wt 143.0 lb

## 2014-06-10 DIAGNOSIS — E785 Hyperlipidemia, unspecified: Secondary | ICD-10-CM

## 2014-06-10 DIAGNOSIS — I10 Essential (primary) hypertension: Secondary | ICD-10-CM

## 2014-06-10 DIAGNOSIS — I4949 Other premature depolarization: Secondary | ICD-10-CM | POA: Diagnosis not present

## 2014-06-10 DIAGNOSIS — M069 Rheumatoid arthritis, unspecified: Secondary | ICD-10-CM

## 2014-06-10 DIAGNOSIS — I2510 Atherosclerotic heart disease of native coronary artery without angina pectoris: Secondary | ICD-10-CM

## 2014-06-10 DIAGNOSIS — I493 Ventricular premature depolarization: Secondary | ICD-10-CM

## 2014-06-10 DIAGNOSIS — I359 Nonrheumatic aortic valve disorder, unspecified: Secondary | ICD-10-CM

## 2014-06-10 DIAGNOSIS — I498 Other specified cardiac arrhythmias: Secondary | ICD-10-CM | POA: Diagnosis not present

## 2014-06-10 DIAGNOSIS — I350 Nonrheumatic aortic (valve) stenosis: Secondary | ICD-10-CM

## 2014-06-10 DIAGNOSIS — R001 Bradycardia, unspecified: Secondary | ICD-10-CM

## 2014-06-10 MED ORDER — METOPROLOL SUCCINATE ER 25 MG PO TB24: 12.5000 mg | ORAL_TABLET | Freq: Every day | ORAL | Status: DC

## 2014-06-10 NOTE — Progress Notes (Signed)
Cardiology Office Note    Date:  06/10/2014   ID:  Angela [name], DOB 07-Jan-1936, MRN [id number]  PCP:  Angela Shelling, MD  Cardiologist:  Dr. Fransico Him      History of Present Illness: Angela [name] is a 78 y.o. female with a history of CAD, status post prior PCI to the LAD, HL, HTN, PVCs, rheumatoid arthritis, peptic ulcer disease, aortic stenosis. Last seen by Dr. Radford Pax 02/2014. Followup echocardiogram demonstrated normal LV function, mild diastolic dysfunction biatrial enlargement and moderate aortic stenosis.  Patient called in recently with low heart rates. She has had bradycardia in the past.  She was at [facility] on 8/3 and our office was called.  DOD recommended no intervention.  Follow up was arranged today. The patient is to be scheduled for a 24-hour Holter.  She has significant issues with rheumatoid arthritis. She also had a prolonged admission in 2014 for gastroenteritis and acute renal failure. She slowly recovered from this. She has chronic fatigue. She has occasional dizziness. She denies syncope. She may have had one episode of near-syncope in the last several weeks. She denies orthopnea, PND. She has chronic LE edema. This may be getting worse. She has occasional chest pain. She's had this for years without significant change. She denies significant dyspnea.   Studies:  - LHC (11/13):  Proximal LAD stent patent, normal left main, circumflex and RCA, EF 50-55% with inferobasal akinesis, LVEDP 17 mm Hg  - Echo (4/15):  Mild LVH, EF 60-65%, normal wall motion, grade 1 diastolic dysfunction, moderate aortic stenosis (mean 17 mm Hg), trivial AI, borderline dilated ascending aorta, severe LAE, mild RAE, mild TR, PASP 35 mm Hg  - Nuclear (5/13):  Breast attenuation, no ischemia, EF 55%-low risk   Recent Labs/Images: 06/22/2013: Hemoglobin 12.1  01/11/2014: Creatinine 0.7; Potassium 4.4; Pro B Natriuretic peptide (BNP) 452.0*  02/18/2014: ALT 13; HDL Cholesterol by NMR  62.20; LDL (calc) 59    Wt Readings from Last 3 Encounters:  06/10/14 143 lb (64.864 kg)  06/07/14 144 lb (65.318 kg)  04/05/14 144 lb (65.318 kg)     Past Medical History  Diagnosis Date  . Cervical stenosis of spine   . Rheumatoid arthritis(714.0)   . Spondylolisthesis of lumbar region   . A-fib   . Asthma   . Depression   . Dyslipidemia   . GERD (gastroesophageal reflux disease)   . Obesity   . PVC's (premature ventricular contractions)   . PUD (peptic ulcer disease)     can not take aspirin  . Osteoporosis     alendronate since 2012  . Macular degeneration   . Aortic stenosis     mild by echo 03/2012  . Hypertension   . CAD (coronary artery disease)     s/p PCI with BMS to mid LAD--2/08 not on aspirin due to frequent ulcers.  Repeat cath for CP 09/2012 with patent LAD stent and otherwise normal coronary arteries    Current Outpatient Prescriptions  Medication Sig Dispense Refill  . butalbital-acetaminophen-caffeine (FIORICET, ESGIC) 50-325-40 MG per tablet Take 1 tablet by mouth as needed. For migraines      . Calcium Carbonate-Vitamin D (CALCIUM 500+D PO) Take 1 tablet by mouth 3 (three) times daily.      . citalopram (CELEXA) 20 MG tablet TAKE 1 TABLET DAILY  90 tablet  2  . clopidogrel (PLAVIX) 75 MG tablet TAKE 1 TABLET (75 MG TOTAL) BY MOUTH EVERY EVENING.  90 tablet  2  .  diclofenac sodium (VOLTAREN) 1 % GEL Apply 2 g topically 2 (two) times daily as needed. For pain  2 Tube  5  . EPIPEN 2-PAK 0.3 MG/0.3ML SOAJ Inject 0.3 mg into the muscle daily as needed. For allergic reaction      . folic acid (FOLVITE) 1 MG tablet Take 1 mg by mouth daily.       . furosemide (LASIX) 40 MG tablet Take 1 tablet (40 mg total) by mouth as needed.  90 tablet  1  . ibandronate (BONIVA) 150 MG tablet Take 150 mg by mouth every 30 (thirty) days.       Marland Kitchen lidocaine (LIDODERM) 5 % APPLY 1 PATCH ONTO THE SKIN DAILY REMOVE AND DISCARD PATCH WITHIN 12 HOURS OR AS DIRECTED BY PRESCRIBER  90  patch  5  . LORazepam (ATIVAN) 1 MG tablet Take 2 mg by mouth at bedtime.      . methotrexate (RHEUMATREX) 2.5 MG tablet Take 6 tablets by mouth once a week. On Saturdays      . metoprolol succinate (TOPROL-XL) 25 MG 24 hr tablet Take 25 mg by mouth daily.      Marland Kitchen NITROSTAT 0.4 MG SL tablet Place 0.4 mg under the tongue every 5 (five) minutes as needed. For chest pain      . oxyCODONE-acetaminophen (PERCOCET) 7.5-325 MG per tablet Take 1 tablet by mouth every 4 (four) hours as needed for pain.  120 tablet  0  . pantoprazole (PROTONIX) 40 MG tablet Take 40 mg by mouth daily.      . predniSONE (DELTASONE) 5 MG tablet Take 5 mg by mouth daily.      Marland Kitchen PREVIDENT 0.2 % SOLN Take by mouth at bedtime. Rinses with about 1 capful nightly      . simvastatin (ZOCOR) 40 MG tablet Take 1 tablet (40 mg total) by mouth at bedtime.  90 tablet  1  . tolterodine (DETROL LA) 4 MG 24 hr capsule Take 4 mg by mouth daily.        No current facility-administered medications for this visit.     Allergies:   Peanut-containing drug products; Reglan; Codeine; Iohexol; and Shellfish allergy   Social History:  The patient  reports that she has never smoked. She has never used smokeless tobacco. She reports that she does not drink alcohol or use illicit drugs.   Family History:  The patient's family history includes Arrhythmia in her mother; Cancer in her brother and mother. There is no history of Heart attack.   ROS:  Please see the history of present illness.      All other systems reviewed and negative.   PHYSICAL EXAM: VS:  BP 120/80  Pulse 61  Ht 5\' 3"  (1.6 m)  Wt 143 lb (64.864 kg)  BMI 25.34 kg/m2 Well nourished, well developed, in no acute distress HEENT: normal Neck: no JVDat 90 Cardiac:  normal S1, S2; RRR; 1/6 systolic murmurRUSB Lungs:  clear to auscultation bilaterally, no wheezing, rhonchi or rales Abd: soft, nontender, no hepatomegaly Ext: trace bilateral LE edemaR>L Skin: warm and dry Neuro:   CNs 2-12 intact, no focal abnormalities noted  EKG:  NSR, HR 61, normal axis, PVC, nonspecific changes     ASSESSMENT AND PLAN:  Bradycardia:  No evidence of bradycardia on ECG today. Apparently her heart was in the 40s last week. She does have fatigue. This may be a consequence of beta blocker therapy. PVCs have been well controlled on beta blocker. I will go  ahead and arrange 24-hour Holter. After her Holter, I have asked her to decrease her Toprol to 12.5 mg daily to see if this helps her fatigue. At this point, I see no indication for pacemaker.  PVC's (premature ventricular contractions):  Well controlled on beta blocker.  Hopefully they will remain well controlled on half dose.    Coronary artery disease involving native coronary artery of native heart without angina pectoris:  No angina. Continue Plavix, statin.  Aortic stenosis:  Moderate by recent echo.  Essential hypertension, benign:  Controlled.  Rheumatoid arthritis:  Likely contributes to her fatigue.  Dyslipidemia:  Continue statin.    Disposition:  Follow up with Dr. Radford Pax in October as planned.   Signed, Versie Starks, MHS 06/10/2014 2:39 PM    [facility] [place], [place], [place]  [address] Phone: [phone]; Fax: [phone]

## 2014-06-10 NOTE — Telephone Encounter (Signed)
Pt is aware. Pt is seeing scott today at two. I spoke with Nicki Reaper and he is aware to order for pt today.

## 2014-06-10 NOTE — Patient Instructions (Signed)
Your physician has recommended that you wear a 24 HOUR holter monitor; PER SCOTT WEAVER, PAC TO SEE IF MONITOR CAN BE PLACED TODAY. Holter monitors are medical devices that record the heart's electrical activity. Doctors most often use these monitors to diagnose arrhythmias. Arrhythmias are problems with the speed or rhythm of the heartbeat. The monitor is a small, portable device. You can wear one while you do your normal daily activities. This is usually used to diagnose what is causing palpitations/syncope (passing out).  DECREASE TOPROL XL TO 1/2 TAB = 12.5 MG DAILY ONCE YOU HAVE COMPLETED THE MONTIOR  KEEP YOUR APPT WITH DR. Radford Pax 08/20/14

## 2014-06-14 ENCOUNTER — Encounter (INDEPENDENT_AMBULATORY_CARE_PROVIDER_SITE_OTHER): Payer: Medicare Other

## 2014-06-14 ENCOUNTER — Encounter: Payer: Self-pay | Admitting: *Deleted

## 2014-06-14 DIAGNOSIS — I498 Other specified cardiac arrhythmias: Secondary | ICD-10-CM

## 2014-06-14 DIAGNOSIS — R001 Bradycardia, unspecified: Secondary | ICD-10-CM

## 2014-06-14 NOTE — Progress Notes (Signed)
Patient ID: Angela [name], female   DOB: June 13, 1936, 78 y.o.   MRN: [id number] E-[facility] 24 hour holter monitor applied to patient.

## 2014-06-17 ENCOUNTER — Telehealth: Payer: Self-pay | Admitting: Cardiology

## 2014-06-17 NOTE — Telephone Encounter (Signed)
New message     Want monitor results 

## 2014-06-17 NOTE — Telephone Encounter (Signed)
Please let patient know that heart monitor showed NSR with frequent extra heart beats from the top and bottom of the heart  She had several runs of nonsustained atrial tachycardia.  Since she has had some problems with bradycardia and her mean HR was 60bpm, please have her stop her Toprol and start Sectral 200mg  BID.  CHeck BP and HR daily for a week and call with results.

## 2014-06-17 NOTE — Telephone Encounter (Signed)
On Dr Landis Gandy desk to review

## 2014-06-18 MED ORDER — ACEBUTOLOL HCL 200 MG PO CAPS: 200.0000 mg | ORAL_CAPSULE | Freq: Two times a day (BID) | ORAL | Status: DC

## 2014-06-18 NOTE — Telephone Encounter (Signed)
F/u    Pt calling for monitor results

## 2014-06-18 NOTE — Telephone Encounter (Signed)
Returned pts call with results. See other telephone note.

## 2014-06-18 NOTE — Telephone Encounter (Signed)
Pt notified and meds updated. She will start the sectral tomorrow since she has already taken Toprol today. Pt will call back at the end of next week with BP and HR readings.

## 2014-06-18 NOTE — Addendum Note (Signed)
Addended byUlla Potash H on: 06/18/2014 03:09 PM   Modules accepted: Orders, Medications

## 2014-06-22 ENCOUNTER — Other Ambulatory Visit: Payer: Self-pay

## 2014-06-22 DIAGNOSIS — Z1231 Encounter for screening mammogram for malignant neoplasm of breast: Secondary | ICD-10-CM

## 2014-06-23 ENCOUNTER — Encounter: Payer: Self-pay | Admitting: Cardiology

## 2014-06-23 NOTE — Telephone Encounter (Signed)
This encounter was created in error - please disregard.

## 2014-06-28 ENCOUNTER — Telehealth: Payer: Self-pay | Admitting: Cardiology

## 2014-06-28 NOTE — Telephone Encounter (Signed)
To Dr Turner to advise 

## 2014-06-28 NOTE — Telephone Encounter (Signed)
follow up:     8/17   New med began BP:   8/17   134/71   45  8/18    121/73   62   8/19    130/69/    56  Am      136/89    59     pm   8/20    116/62     64  8/21     140/90      56  8/21     127/81     45  8/23      94/50      52

## 2014-06-29 DIAGNOSIS — R42 Dizziness and giddiness: Secondary | ICD-10-CM | POA: Diagnosis not present

## 2014-06-29 DIAGNOSIS — R404 Transient alteration of awareness: Secondary | ICD-10-CM | POA: Diagnosis not present

## 2014-06-29 NOTE — Telephone Encounter (Signed)
BP and HR are stable.  Please find out how she is feeling on the Sectral

## 2014-06-29 NOTE — Telephone Encounter (Signed)
Pt does not feel a great big difference. She feels tired still. Dizziness is better. She feels it might be working she its hard to tell with just one week.

## 2014-07-07 NOTE — Telephone Encounter (Signed)
Pt aware.

## 2014-07-07 NOTE — Telephone Encounter (Signed)
Continue current meds 

## 2014-07-27 DIAGNOSIS — E7800 Pure hypercholesterolemia, unspecified: Secondary | ICD-10-CM | POA: Diagnosis not present

## 2014-07-27 DIAGNOSIS — K219 Gastro-esophageal reflux disease without esophagitis: Secondary | ICD-10-CM | POA: Diagnosis not present

## 2014-07-27 DIAGNOSIS — Z23 Encounter for immunization: Secondary | ICD-10-CM | POA: Diagnosis not present

## 2014-07-27 DIAGNOSIS — Z1331 Encounter for screening for depression: Secondary | ICD-10-CM | POA: Diagnosis not present

## 2014-07-27 DIAGNOSIS — I10 Essential (primary) hypertension: Secondary | ICD-10-CM | POA: Diagnosis not present

## 2014-07-27 DIAGNOSIS — N318 Other neuromuscular dysfunction of bladder: Secondary | ICD-10-CM | POA: Diagnosis not present

## 2014-08-09 ENCOUNTER — Encounter: Payer: Medicare Other | Attending: Physical Medicine and Rehabilitation | Admitting: Registered Nurse

## 2014-08-09 ENCOUNTER — Encounter: Payer: Self-pay | Admitting: Registered Nurse

## 2014-08-09 VITALS — BP 103/70 | HR 64 | Resp 14 | Wt 147.4 lb

## 2014-08-09 DIAGNOSIS — I2510 Atherosclerotic heart disease of native coronary artery without angina pectoris: Secondary | ICD-10-CM | POA: Diagnosis not present

## 2014-08-09 DIAGNOSIS — M069 Rheumatoid arthritis, unspecified: Secondary | ICD-10-CM | POA: Insufficient documentation

## 2014-08-09 DIAGNOSIS — Z5181 Encounter for therapeutic drug level monitoring: Secondary | ICD-10-CM | POA: Insufficient documentation

## 2014-08-09 DIAGNOSIS — Z79899 Other long term (current) drug therapy: Secondary | ICD-10-CM | POA: Diagnosis not present

## 2014-08-09 DIAGNOSIS — M4316 Spondylolisthesis, lumbar region: Secondary | ICD-10-CM | POA: Diagnosis not present

## 2014-08-09 MED ORDER — OXYCODONE-ACETAMINOPHEN 7.5-325 MG PO TABS: 1.0000 | ORAL_TABLET | ORAL | Status: DC | PRN

## 2014-08-09 NOTE — Progress Notes (Signed)
Subjective:    Patient ID: Angela [name], female    DOB: 17-Oct-1936, 78 y.o.   MRN: [id number]  HPI: [name] is a 78 year old female who returns for follow up for chronic pain and medication refill. She says her pain is located in her neck and lower back. She rates her pain 9. Her current exercise regime is walking with the walker in her home and walking to her mailbox 4 times a day when weather permits. She states "the cardiologist change her medications and has been experiencing dizziness at times". She has been instructed to keep a record of her blood pressure and weight. She has a care giver who will be obtaining this information. She stated" her appointment with the cardiologist on the 16th, has been instructed to call her cardiologist and she verbalizes understanding.  Pain Inventory Average Pain 4 Pain Right Now 9 My pain is sharp and aching  In the last 24 hours, has pain interfered with the following? General activity 9 Relation with others 8 Enjoyment of life 9 What TIME of day is your pain at its worst? morning Sleep (in general) Fair  Pain is worse with: walking, bending, standing and some activites Pain improves with: rest, heat/ice and medication Relief from Meds: 8  Mobility use a walker how many minutes can you walk? 1-2 ability to climb steps?  no do you drive?  no  Function disabled: date disabled . retired I need assistance with the following:  dressing, bathing, meal prep, household duties and shopping  Neuro/Psych bladder control problems tingling trouble walking anxiety  Prior Studies Any changes since last visit?  no  Physicians involved in your care Any changes since last visit?  no   Family History  Problem Relation Age of Onset  . Cancer Mother     lung cancer  . Cancer Brother     melanoma  . Arrhythmia Mother   . Heart attack Neg Hx    History   Social History  . Marital Status: Married    Spouse Name: N/A   Number of Children: N/A  . Years of Education: N/A   Social History Main Topics  . Smoking status: Never Smoker   . Smokeless tobacco: Never Used  . Alcohol Use: No  . Drug Use: No  . Sexual Activity: None   Other Topics Concern  . None   Social History Narrative  . None   Past Surgical History  Procedure Laterality Date  . Fracture surgery  09/2011    left ankle screw and pin removed  . Cardiac catheterization  09/2012    patent LAD stent and otherwise normal coronary arteries   Past Medical History  Diagnosis Date  . Cervical stenosis of spine   . Rheumatoid arthritis(714.0)   . Spondylolisthesis of lumbar region   . A-fib   . Asthma   . Depression   . Dyslipidemia   . GERD (gastroesophageal reflux disease)   . Obesity   . PVC's (premature ventricular contractions)   . PUD (peptic ulcer disease)     can not take aspirin  . Osteoporosis     alendronate since 2012  . Macular degeneration   . Aortic stenosis     mild by echo 03/2012  . Hypertension   . CAD (coronary artery disease)     s/p PCI with BMS to mid LAD--2/08 not on aspirin due to frequent ulcers.  Repeat cath for CP 09/2012 with patent LAD  stent and otherwise normal coronary arteries   BP 103/70  Pulse 64  Resp 14  Wt 147 lb 6.4 oz (66.86 kg)  SpO2 98%  Opioid Risk Score:   Fall Risk Score: Moderate Fall Risk (6-13 points) (previoulsy educated and given handout) Review of Systems  Genitourinary:       Bladder control problems  Musculoskeletal: Positive for gait problem.  Neurological:       Tingling  Hematological: Bruises/bleeds easily.  Psychiatric/Behavioral: The patient is nervous/anxious.   All other systems reviewed and are negative.      Objective:   Physical Exam  Nursing note and vitals reviewed. Constitutional: She is oriented to person, place, and time. She appears well-developed and well-nourished.  HENT:  Head: Normocephalic and atraumatic.  Neck: Normal range of motion.  Neck supple.  Cardiovascular: Normal rate and regular rhythm.   Pulmonary/Chest: Effort normal and breath sounds normal.  Musculoskeletal: She exhibits edema.  Normal Muscle Bulk and Muscle Testing Reveals: Upper Extremities: Decreased ROM 90 Degrees and Muscle Strength 4/5 Back without spinal or Paraspinal Tenderness Lower Extremities: Full ROM and Muscle Strength 5/5 Arises from chair with ease/ Using Walker for assistance Narrow Based gait   Neurological: She is alert and oriented to person, place, and time.  Skin: Skin is warm and dry.  Psychiatric: She has a normal mood and affect.          Assessment & Plan:  1. History of cervical stenosis/spondylosis with myelopathy:  Refilled: Oxycodone 7.5/325mg  one tablet every 4 hours as needed for pain #120. Second script given 2. Rheumatoid arthritis. Uses the Lidocaine Patches and alternates with Voltaren gel. Continue to Monitor  3. Lumbar spondylolisthesis. Continue with exercise and heat therapy.  20 minutes of face to face patient care time was spent during this visit. All questions were encouraged and answered.   F/U in 2 months

## 2014-08-20 ENCOUNTER — Other Ambulatory Visit (INDEPENDENT_AMBULATORY_CARE_PROVIDER_SITE_OTHER): Payer: Medicare Other | Admitting: *Deleted

## 2014-08-20 ENCOUNTER — Encounter: Payer: Self-pay | Admitting: Cardiology

## 2014-08-20 ENCOUNTER — Ambulatory Visit (INDEPENDENT_AMBULATORY_CARE_PROVIDER_SITE_OTHER): Payer: Medicare Other | Admitting: Cardiology

## 2014-08-20 VITALS — BP 118/70 | HR 55 | Ht 63.0 in | Wt 144.4 lb

## 2014-08-20 DIAGNOSIS — I350 Nonrheumatic aortic (valve) stenosis: Secondary | ICD-10-CM

## 2014-08-20 DIAGNOSIS — I2584 Coronary atherosclerosis due to calcified coronary lesion: Secondary | ICD-10-CM

## 2014-08-20 DIAGNOSIS — I10 Essential (primary) hypertension: Secondary | ICD-10-CM

## 2014-08-20 DIAGNOSIS — I493 Ventricular premature depolarization: Secondary | ICD-10-CM | POA: Diagnosis not present

## 2014-08-20 DIAGNOSIS — E785 Hyperlipidemia, unspecified: Secondary | ICD-10-CM

## 2014-08-20 DIAGNOSIS — I2510 Atherosclerotic heart disease of native coronary artery without angina pectoris: Secondary | ICD-10-CM | POA: Diagnosis not present

## 2014-08-20 LAB — HEPATIC FUNCTION PANEL
ALBUMIN: 3.6 g/dL (ref 3.5–5.2)
ALT: 11 U/L (ref 0–35)
AST: 19 U/L (ref 0–37)
Alkaline Phosphatase: 42 U/L (ref 39–117)
Bilirubin, Direct: 0 mg/dL (ref 0.0–0.3)
Total Bilirubin: 0.9 mg/dL (ref 0.2–1.2)
Total Protein: 6.9 g/dL (ref 6.0–8.3)

## 2014-08-20 LAB — LIPID PANEL
CHOLESTEROL: 140 mg/dL (ref 0–200)
HDL: 53.9 mg/dL (ref >39.00)
LDL CALC: 69 mg/dL (ref 0–99)
NonHDL: 86.1
TRIGLYCERIDES: 88 mg/dL (ref 0.0–149.0)
Total CHOL/HDL Ratio: 3
VLDL: 17.6 mg/dL (ref 0.0–40.0)

## 2014-08-20 NOTE — Patient Instructions (Signed)
Your physician recommends that you continue on your current medications as directed. Please refer to the Current Medication list given to you today.  Your physician wants you to follow-up in: 6 months with Dr Turner You will receive a reminder letter in the mail two months in advance. If you don't receive a letter, please call our office to schedule the follow-up appointment.  

## 2014-08-20 NOTE — Progress Notes (Signed)
[name], [name],   [address] Phone: [phone] Fax:  [phone]  Date:  08/20/2014   ID:  Angela [name], DOB Jun 14, 1936, MRN [id number]  PCP:  Irven Shelling, MD  Cardiologist:  Fransico Him, MD    History of Present Illness: Angela [name] is a 78 y.o. female with a history of CAD, status post prior PCI to the LAD, HL, HTN, PVCs, rheumatoid arthritis, peptic ulcer disease, moderate aortic stenosis.  Followup echocardiogram demonstrated normal LV function, mild diastolic dysfunction biatrial enlargement and moderate aortic stenosis. Patient called in recently with low heart rates. She has had bradycardia in the past. She was seen by Richardson Dopp PA and her Toprol was decreased to 12.5mg  daily and a Holter monitor was placed which showed NSR with PAC's and PVC's and several runs of nonsustained atrial tachycardia.  Her Toprol was stopped and she was placed on Acebutolol.  Since then her bradycardia has improved but she gets dizzy spells when bending over and then getting back up.   She has TED hose but is not wearing them.  She has chronic stable angina and has not had any chest pain since seen our PA.  She has chronic DOE that is stable.  She occasionally will have some mild LE edema which is controlled on PRN diuretics.    Wt Readings from Last 3 Encounters:  08/20/14 144 lb 6.4 oz (65.499 kg)  08/09/14 147 lb 6.4 oz (66.86 kg)  06/10/14 143 lb (64.864 kg)     Past Medical History  Diagnosis Date  . Cervical stenosis of spine   . Rheumatoid arthritis(714.0)   . Spondylolisthesis of lumbar region   . A-fib   . Asthma   . Depression   . Dyslipidemia   . GERD (gastroesophageal reflux disease)   . Obesity   . PVC's (premature ventricular contractions)   . PUD (peptic ulcer disease)     can not take aspirin  . Osteoporosis     alendronate since 2012  . Macular degeneration   . Aortic stenosis     mild by echo 03/2012  . Hypertension   . CAD  (coronary artery disease)     s/p PCI with BMS to mid LAD--2/08 not on aspirin due to frequent ulcers.  Repeat cath for CP 09/2012 with patent LAD stent and otherwise normal coronary arteries    Current Outpatient Prescriptions  Medication Sig Dispense Refill  . acebutolol (SECTRAL) 200 MG capsule Take 1 capsule (200 mg total) by mouth 2 (two) times daily.  60 capsule  6  . butalbital-acetaminophen-caffeine (FIORICET, ESGIC) 50-325-40 MG per tablet Take 1 tablet by mouth as needed. For migraines      . Calcium Carbonate-Vitamin D (CALCIUM 500+D PO) Take 1 tablet by mouth 3 (three) times daily.      . citalopram (CELEXA) 20 MG tablet TAKE 1 TABLET DAILY  90 tablet  2  . clopidogrel (PLAVIX) 75 MG tablet TAKE 1 TABLET (75 MG TOTAL) BY MOUTH EVERY EVENING.  90 tablet  2  . diclofenac sodium (VOLTAREN) 1 % GEL Apply 2 g topically 2 (two) times daily as needed. For pain  2 Tube  5  . EPIPEN 2-PAK 0.3 MG/0.3ML SOAJ Inject 0.3 mg into the muscle daily as needed. For allergic reaction      . folic acid (FOLVITE) 1 MG tablet Take 1 mg by mouth daily.       . furosemide (LASIX) 40 MG tablet Take  1 tablet (40 mg total) by mouth as needed.  90 tablet  1  . ibandronate (BONIVA) 150 MG tablet Take 150 mg by mouth every 30 (thirty) days.       Marland Kitchen lidocaine (LIDODERM) 5 % APPLY 1 PATCH ONTO THE SKIN DAILY REMOVE AND DISCARD PATCH WITHIN 12 HOURS OR AS DIRECTED BY PRESCRIBER  90 patch  5  . LORazepam (ATIVAN) 1 MG tablet Take 2 mg by mouth at bedtime.      . methotrexate (RHEUMATREX) 2.5 MG tablet Take 6 tablets by mouth once a week. On Saturdays      . NITROSTAT 0.4 MG SL tablet Place 0.4 mg under the tongue every 5 (five) minutes as needed. For chest pain      . oxyCODONE-acetaminophen (PERCOCET) 7.5-325 MG per tablet Take 1 tablet by mouth every 4 (four) hours as needed for pain.  120 tablet  0  . pantoprazole (PROTONIX) 40 MG tablet Take 40 mg by mouth daily.      . predniSONE (DELTASONE) 5 MG tablet Take 5  mg by mouth daily.      Marland Kitchen PREVIDENT 0.2 % SOLN Take by mouth at bedtime. Rinses with about 1 capful nightly      . simvastatin (ZOCOR) 40 MG tablet Take 1 tablet (40 mg total) by mouth at bedtime.  90 tablet  1  . tolterodine (DETROL LA) 4 MG 24 hr capsule Take 4 mg by mouth daily.        No current facility-administered medications for this visit.    Allergies:    Allergies  Allergen Reactions  . Peanut-Containing Drug Products Anaphylaxis  . Reglan [Metoclopramide] Other (See Comments)    "Messed my mind up"  . Codeine Nausea Only  . Iohexol Swelling     Desc: pt received hypaque 60% in 1994 and had erythema, hives & lips swell.  She did ok w/o premeds in 8/05 at [facility].  Pt. takes daily prednisone for rheumatoid arthritis.  Poor venous access today (7/7/6) so we did her w/o iv contrast.   . Shellfish Allergy Swelling    Lips swell    Social History:  The patient  reports that she has never smoked. She has never used smokeless tobacco. She reports that she does not drink alcohol or use illicit drugs.   Family History:  The patient's family history includes Arrhythmia in her mother; Cancer in her brother and mother. There is no history of Heart attack.   ROS:  Please see the history of present illness.      All other systems reviewed and negative.   PHYSICAL EXAM: VS:  BP 118/70  Pulse 55  Ht 5\' 3"  (1.6 m)  Wt 144 lb 6.4 oz (65.499 kg)  BMI 25.59 kg/m2 Well nourished, well developed, in no acute distress HEENT: normal Neck: no JVD Cardiac:  normal S1, S2; RRR; no murmur Lungs:  clear to auscultation bilaterally, no wheezing, rhonchi or rales Abd: soft, nontender, no hepatomegaly Ext: no edema Skin: warm and dry Neuro:  CNs 2-12 intact, no focal abnormalities noted  EKG:  NSR with PVC's and nonspecific ST abnormality  ASSESSMENT AND PLAN:  Bradycardia: Her Toprol was switched to Sectral for suppression of her PVC's to avoid significant bradycardia.  Her HR is in the 60's.   She says that she gets dizzy when she bends over but otherwise she tolerates the Acebutolol ok.  I suspect she is having some orthostasis so I have encouraged her to go  back to wearing her TED hose stockings. PVC's (premature ventricular contractions): Well controlled on beta blocker. Coronary artery disease involving native coronary artery of native heart without angina pectoris: No angina. Continue Plavix, statin.  Aortic stenosis: Moderate by recent echo.  Essential hypertension, benign: Controlled. Rheumatoid arthritis: Likely contributes to her fatigue.  Dyslipidemia: Continue statin. LDL is at goal  Followup with me in 6 months  Signed, Fransico Him, MD [facility] 08/20/2014 3:05 PM

## 2014-08-30 ENCOUNTER — Ambulatory Visit: Payer: Medicare Other

## 2014-08-30 DIAGNOSIS — R351 Nocturia: Secondary | ICD-10-CM | POA: Diagnosis not present

## 2014-08-30 DIAGNOSIS — R3915 Urgency of urination: Secondary | ICD-10-CM | POA: Diagnosis not present

## 2014-08-30 DIAGNOSIS — N3946 Mixed incontinence: Secondary | ICD-10-CM | POA: Diagnosis not present

## 2014-09-14 ENCOUNTER — Ambulatory Visit
Admission: RE | Admit: 2014-09-14 | Discharge: 2014-09-14 | Disposition: A | Discharge status: Home / Self Care | Payer: Medicare Other | Source: Ambulatory Visit

## 2014-09-14 DIAGNOSIS — Z1231 Encounter for screening mammogram for malignant neoplasm of breast: Secondary | ICD-10-CM | POA: Diagnosis not present

## 2014-09-28 DIAGNOSIS — M2550 Pain in unspecified joint: Secondary | ICD-10-CM | POA: Diagnosis not present

## 2014-09-28 DIAGNOSIS — Z79899 Other long term (current) drug therapy: Secondary | ICD-10-CM | POA: Diagnosis not present

## 2014-09-28 DIAGNOSIS — M0589 Other rheumatoid arthritis with rheumatoid factor of multiple sites: Secondary | ICD-10-CM | POA: Diagnosis not present

## 2014-09-30 ENCOUNTER — Other Ambulatory Visit: Payer: Self-pay | Admitting: Cardiology

## 2014-10-06 ENCOUNTER — Other Ambulatory Visit: Payer: Self-pay | Admitting: Cardiology

## 2014-10-06 DIAGNOSIS — N3946 Mixed incontinence: Secondary | ICD-10-CM | POA: Diagnosis not present

## 2014-10-06 DIAGNOSIS — R351 Nocturia: Secondary | ICD-10-CM | POA: Diagnosis not present

## 2014-10-06 DIAGNOSIS — R3915 Urgency of urination: Secondary | ICD-10-CM | POA: Diagnosis not present

## 2014-10-11 ENCOUNTER — Encounter: Payer: Medicare Other | Attending: Physical Medicine and Rehabilitation | Admitting: Registered Nurse

## 2014-10-11 ENCOUNTER — Encounter: Payer: Self-pay | Admitting: Registered Nurse

## 2014-10-11 VITALS — BP 132/54 | HR 44 | Resp 14 | Ht 63.0 in | Wt 148.0 lb

## 2014-10-11 DIAGNOSIS — M069 Rheumatoid arthritis, unspecified: Secondary | ICD-10-CM | POA: Diagnosis not present

## 2014-10-11 DIAGNOSIS — Z79899 Other long term (current) drug therapy: Secondary | ICD-10-CM

## 2014-10-11 DIAGNOSIS — M4712 Other spondylosis with myelopathy, cervical region: Secondary | ICD-10-CM

## 2014-10-11 DIAGNOSIS — M4316 Spondylolisthesis, lumbar region: Secondary | ICD-10-CM | POA: Insufficient documentation

## 2014-10-11 DIAGNOSIS — Z5181 Encounter for therapeutic drug level monitoring: Secondary | ICD-10-CM | POA: Diagnosis not present

## 2014-10-11 DIAGNOSIS — G894 Chronic pain syndrome: Secondary | ICD-10-CM

## 2014-10-11 DIAGNOSIS — I2510 Atherosclerotic heart disease of native coronary artery without angina pectoris: Secondary | ICD-10-CM

## 2014-10-11 MED ORDER — OXYCODONE-ACETAMINOPHEN 7.5-325 MG PO TABS: 1.0000 | ORAL_TABLET | ORAL | Status: DC | PRN

## 2014-10-11 NOTE — Progress Notes (Signed)
Subjective:    Patient ID: Angela [name], female    DOB: 1935-12-10, 78 y.o.   MRN: [id number]  HPI: [name] is a 78 year old female who returns for follow up for chronic pain and medication refill. She says her pain is located in her lower back. She rates her pain 8. Her current exercise regime is walking with the walker in her home and walking to her mailbox 4 times a day when weather permits. She arrived to the office bradycardic heart rate 44, apical pulse checked 69. She has been instructed to help a blood pressure and heart rate log daily. Also instructed to call Dr. Radford Pax with values. She verbalizes understanding.  Also states she has been having urinary incontinence and Urology is following, Dr. Sabra Heck. She states" she has November oxycodone script at home, only one script given, the other discarded.  Pain Inventory Average Pain 6 Pain Right Now 8 My pain is intermittent, dull and aching  In the last 24 hours, has pain interfered with the following? General activity 8 Relation with others 8 Enjoyment of life 8 What TIME of day is your pain at its worst? morning Sleep (in general) Good  Pain is worse with: walking, bending and standing Pain improves with: rest, heat/ice and medication Relief from Meds: 8  Mobility use a walker how many minutes can you walk? 2 ability to climb steps?  no do you drive?  no  Function retired  Neuro/Psych bladder control problems numbness trouble walking anxiety  Prior Studies Any changes since last visit?  no  Physicians involved in your care Any changes since last visit?  no   Family History  Problem Relation Age of Onset  . Cancer Mother     lung cancer  . Cancer Brother     melanoma  . Arrhythmia Mother   . Heart attack Neg Hx    History   Social History  . Marital Status: Married    Spouse Name: N/A    Number of Children: N/A  . Years of Education: N/A   Social History Main Topics  . Smoking  status: Never Smoker   . Smokeless tobacco: Never Used  . Alcohol Use: No  . Drug Use: No  . Sexual Activity: None   Other Topics Concern  . None   Social History Narrative   Past Surgical History  Procedure Laterality Date  . Fracture surgery  09/2011    left ankle screw and pin removed  . Cardiac catheterization  09/2012    patent LAD stent and otherwise normal coronary arteries   Past Medical History  Diagnosis Date  . Cervical stenosis of spine   . Rheumatoid arthritis(714.0)   . Spondylolisthesis of lumbar region   . A-fib   . Asthma   . Depression   . Dyslipidemia   . GERD (gastroesophageal reflux disease)   . Obesity   . PVC's (premature ventricular contractions)   . PUD (peptic ulcer disease)     can not take aspirin  . Osteoporosis     alendronate since 2012  . Macular degeneration   . Aortic stenosis     mild by echo 03/2012  . Hypertension   . CAD (coronary artery disease)     s/p PCI with BMS to mid LAD--2/08 not on aspirin due to frequent ulcers.  Repeat cath for CP 09/2012 with patent LAD stent and otherwise normal coronary arteries   BP 132/54 mmHg  Pulse 44  Resp 14  Ht 5\' 3"  (1.6 m)  Wt 148 lb (67.132 kg)  BMI 26.22 kg/m2  SpO2 92%  Opioid Risk Score:   Fall Risk Score: Moderate Fall Risk (6-13 points)  Review of Systems  Constitutional: Negative.   HENT: Negative.   Eyes: Negative.   Respiratory: Negative.   Cardiovascular: Negative.   Gastrointestinal: Negative.   Endocrine: Negative.   Genitourinary:       Bladder control problems  Musculoskeletal: Positive for myalgias, back pain, arthralgias and neck pain.  Skin: Negative.   Allergic/Immunologic: Negative.   Neurological: Positive for numbness.       Trouble walking  Hematological: Negative.   Psychiatric/Behavioral: The patient is nervous/anxious.        Objective:   Physical Exam  Constitutional: She is oriented to person, place, and time. She appears well-developed and  well-nourished.  HENT:  Head: Normocephalic and atraumatic.  Neck: Normal range of motion. Neck supple.  Cardiovascular: Normal rate and regular rhythm.   Pulmonary/Chest: Effort normal and breath sounds normal.  Musculoskeletal:  Normal Muscle Bulk and Muscle testing Reveals: Upper Extremities: Right Decreased ROM 90 Degrees and Muscle strength 4/5 and Left with Decreased ROM 60 Degrees and Muscle Strength 4/5 Lower Extremities: Full ROM and Muscle strength 5/5 Arises from chair with ease using walker Narrow Based Gait.  Neurological: She is alert and oriented to person, place, and time.  Skin: Skin is warm and dry.  Psychiatric: She has a normal mood and affect.  Nursing note and vitals reviewed.         Assessment & Plan:  1. History of cervical stenosis/spondylosis with myelopathy:  Refilled: Oxycodone 7.5/325mg  one tablet every 4 hours as needed for pain #120.  2. Rheumatoid arthritis. Uses the Lidocaine Patches and alternates with Voltaren gel. Continue to Monitor  3. Lumbar spondylolisthesis. Continue with exercise and heat therapy.  4. Bradycardia: F/U with Cardiology  20 minutes of face to face patient care time was spent during this visit. All questions were encouraged and answered.   F/U in 2 months

## 2014-10-12 ENCOUNTER — Other Ambulatory Visit: Payer: Self-pay | Admitting: Physical Medicine & Rehabilitation

## 2014-10-12 DIAGNOSIS — G894 Chronic pain syndrome: Secondary | ICD-10-CM | POA: Diagnosis not present

## 2014-10-12 DIAGNOSIS — Z79899 Other long term (current) drug therapy: Secondary | ICD-10-CM | POA: Diagnosis not present

## 2014-10-12 DIAGNOSIS — Z5181 Encounter for therapeutic drug level monitoring: Secondary | ICD-10-CM | POA: Diagnosis not present

## 2014-10-13 LAB — PMP ALCOHOL METABOLITE (ETG)

## 2014-10-18 LAB — OXYCODONE, URINE (LC/MS-MS)
NOROXYCODONE, UR: 2385 ng/mL (ref <50)
OXYCODONE, UR: 849 ng/mL (ref <50)
OXYMORPHONE, URINE: 108 ng/mL (ref <50)

## 2014-10-18 LAB — ETHYL GLUCURONIDE, URINE
ETHYL SULFATE (ETS): 207 ng/mL — AB (ref <100)
Ethyl Glucuronide (EtG): NEGATIVE ng/mL (ref <500)

## 2014-10-18 LAB — BENZODIAZEPINES (GC/LC/MS), URINE
ALPRAZOLAMU: NEGATIVE ng/mL (ref <25)
CLONAZEPAU: NEGATIVE ng/mL (ref <25)
Flurazepam metabolite (GC/LC/MS), ur confirm: NEGATIVE ng/mL (ref <50)
Lorazepam (GC/LC/MS), ur confirm: 1855 ng/mL — AB (ref <50)
Midazolam (GC/LC/MS), ur confirm: NEGATIVE ng/mL (ref <50)
Nordiazepam (GC/LC/MS), ur confirm: NEGATIVE ng/mL (ref <50)
OXAZEPAMU: NEGATIVE ng/mL (ref <50)
TEMAZEPAMU: NEGATIVE ng/mL (ref <50)
Triazolam metabolite (GC/LC/MS), ur confirm: NEGATIVE ng/mL (ref <50)

## 2014-10-18 LAB — OPIATES/OPIOIDS (LC/MS-MS)
Codeine Urine: NEGATIVE ng/mL (ref <50)
HYDROMORPHONE: NEGATIVE ng/mL (ref <50)
Hydrocodone: NEGATIVE ng/mL (ref <50)
Morphine Urine: NEGATIVE ng/mL (ref <50)
Norhydrocodone, Ur: NEGATIVE ng/mL (ref <50)
Noroxycodone, Ur: 2385 ng/mL (ref <50)
Oxycodone, ur: 849 ng/mL (ref <50)
Oxymorphone: 108 ng/mL (ref <50)

## 2014-10-19 LAB — PRESCRIPTION MONITORING PROFILE (SOLSTAS)
Amphetamine/Meth: NEGATIVE ng/mL
Barbiturate Screen, Urine: NEGATIVE ng/mL
Buprenorphine, Urine: NEGATIVE ng/mL
CANNABINOID SCRN UR: NEGATIVE ng/mL
CARISOPRODOL, URINE: NEGATIVE ng/mL
COCAINE METABOLITES: NEGATIVE ng/mL
CREATININE, URINE: 73.68 mg/dL (ref >20.0)
ECSTASY: NEGATIVE ng/mL
Fentanyl, Ur: NEGATIVE ng/mL
MEPERIDINE UR: NEGATIVE ng/mL
Methadone Screen, Urine: NEGATIVE ng/mL
Nitrites, Initial: NEGATIVE ug/mL
Propoxyphene: NEGATIVE ng/mL
Tapentadol, urine: NEGATIVE ng/mL
Tramadol Scrn, Ur: NEGATIVE ng/mL
Zolpidem, Urine: NEGATIVE ng/mL
pH, Initial: 6 pH (ref 4.5–8.9)

## 2014-10-20 NOTE — Progress Notes (Signed)
Urine drug screen for this encounter is consistent 

## 2014-11-12 DIAGNOSIS — R3915 Urgency of urination: Secondary | ICD-10-CM | POA: Diagnosis not present

## 2014-11-12 DIAGNOSIS — R351 Nocturia: Secondary | ICD-10-CM | POA: Diagnosis not present

## 2014-11-12 DIAGNOSIS — N3946 Mixed incontinence: Secondary | ICD-10-CM | POA: Diagnosis not present

## 2014-11-25 DIAGNOSIS — R3915 Urgency of urination: Secondary | ICD-10-CM | POA: Diagnosis not present

## 2014-11-25 DIAGNOSIS — N3946 Mixed incontinence: Secondary | ICD-10-CM | POA: Diagnosis not present

## 2014-11-25 DIAGNOSIS — R351 Nocturia: Secondary | ICD-10-CM | POA: Diagnosis not present

## 2014-12-02 ENCOUNTER — Encounter: Payer: Self-pay | Admitting: Cardiology

## 2014-12-08 ENCOUNTER — Encounter: Payer: Self-pay | Admitting: Registered Nurse

## 2014-12-08 ENCOUNTER — Encounter: Payer: Medicare Other | Attending: Physical Medicine and Rehabilitation | Admitting: Registered Nurse

## 2014-12-08 VITALS — BP 103/51 | HR 68 | Resp 14

## 2014-12-08 DIAGNOSIS — Z5181 Encounter for therapeutic drug level monitoring: Secondary | ICD-10-CM

## 2014-12-08 DIAGNOSIS — M069 Rheumatoid arthritis, unspecified: Secondary | ICD-10-CM | POA: Diagnosis not present

## 2014-12-08 DIAGNOSIS — Z79899 Other long term (current) drug therapy: Secondary | ICD-10-CM | POA: Insufficient documentation

## 2014-12-08 DIAGNOSIS — M4316 Spondylolisthesis, lumbar region: Secondary | ICD-10-CM | POA: Insufficient documentation

## 2014-12-08 DIAGNOSIS — G894 Chronic pain syndrome: Secondary | ICD-10-CM

## 2014-12-08 MED ORDER — OXYCODONE-ACETAMINOPHEN 7.5-325 MG PO TABS: 1.0000 | ORAL_TABLET | ORAL | Status: DC | PRN

## 2014-12-08 NOTE — Progress Notes (Signed)
Subjective:    Patient ID: Angela [name], female    DOB: 06-11-1936, 79 y.o.   MRN: [id number]  HPI: [name] is a 79 year old female who returns for follow up for chronic pain and medication refill. She says her pain is located in her neck and lower back. She rates her pain 8. Her current exercise regime is walking with the walker in her home and walking to her mailbox 4 times a day when weather permits. She says she had an anaphylaxis reaction to a new medication prescribed by her urologist. Medication has been discontinued.  Pain Inventory Average Pain 6 Pain Right Now 8 My pain is sharp, dull and aching  In the last 24 hours, has pain interfered with the following? General activity 9 Relation with others 9 Enjoyment of life 8 What TIME of day is your pain at its worst? morning Sleep (in general) Good  Pain is worse with: walking, bending, standing and some activites Pain improves with: rest and medication Relief from Meds: 8  Mobility walk with assistance use a walker ability to climb steps?  no do you drive?  no use a wheelchair  Function retired I need assistance with the following:  bathing, meal prep, household duties and shopping  Neuro/Psych bladder control problems numbness anxiety  Prior Studies Any changes since last visit?  yes  Physicians involved in your care Any changes since last visit?  yes   Family History  Problem Relation Age of Onset  . Cancer Mother     lung cancer  . Cancer Brother     melanoma  . Arrhythmia Mother   . Heart attack Neg Hx    History   Social History  . Marital Status: Married    Spouse Name: N/A    Number of Children: N/A  . Years of Education: N/A   Social History Main Topics  . Smoking status: Never Smoker   . Smokeless tobacco: Never Used  . Alcohol Use: No  . Drug Use: No  . Sexual Activity: None   Other Topics Concern  . None   Social History Narrative   Past Surgical History    Procedure Laterality Date  . Fracture surgery  09/2011    left ankle screw and pin removed  . Cardiac catheterization  09/2012    patent LAD stent and otherwise normal coronary arteries   Past Medical History  Diagnosis Date  . Cervical stenosis of spine   . Rheumatoid arthritis(714.0)   . Spondylolisthesis of lumbar region   . A-fib   . Asthma   . Depression   . Dyslipidemia   . GERD (gastroesophageal reflux disease)   . Obesity   . PVC's (premature ventricular contractions)   . PUD (peptic ulcer disease)     can not take aspirin  . Osteoporosis     alendronate since 2012  . Macular degeneration   . Aortic stenosis     mild by echo 03/2012  . Hypertension   . CAD (coronary artery disease)     s/p PCI with BMS to mid LAD--2/08 not on aspirin due to frequent ulcers.  Repeat cath for CP 09/2012 with patent LAD stent and otherwise normal coronary arteries   BP 103/51 mmHg  Pulse 68  Resp 14  SpO2 97%  Opioid Risk Score:   Fall Risk Score: Moderate Fall Risk (6-13 points) (pt given fall sfety prevention pamphlet today)  Review of Systems  Cardiovascular: Positive for  leg swelling.  Genitourinary:       Retention  Neurological: Positive for numbness.  Psychiatric/Behavioral: The patient is nervous/anxious.   All other systems reviewed and are negative.      Objective:   Physical Exam  Constitutional: She is oriented to person, place, and time. She appears well-developed and well-nourished.  HENT:  Head: Normocephalic and atraumatic.  Neck: Normal range of motion. Neck supple.  Cardiovascular: Normal rate and regular rhythm.   Pulmonary/Chest: Effort normal and breath sounds normal.  Musculoskeletal:  Normal Muscle Bulk and Muscle testing Reveals: Upper Extremities: Decreased ROM Right 45 Degrees and Left 90 Degrees and Muscle Strength 4/5 Lumbar Paraspinal Tenderness: L-3- L-5 Lower Extremities: Full ROM and Muscle Strength 5/5 Arises from chair with  ease Narrow Based gait    Neurological: She is alert and oriented to person, place, and time.  Skin: Skin is warm and dry.  Psychiatric: She has a normal mood and affect.  Nursing note and vitals reviewed.         Assessment & Plan:  1. History of cervical stenosis/spondylosis with myelopathy:  Refilled: Oxycodone 7.5/325mg  one tablet every 4 hours as needed for pain #120. Second script given 2. Rheumatoid arthritis. Uses the Lidocaine Patches and alternates with Voltaren gel. Continue to Monitor  3. Lumbar spondylolisthesis. Continue with exercise and heat therapy.   20 minutes of face to face patient care time was spent during this visit. All questions were encouraged and answered.   F/U in 2 months

## 2014-12-16 ENCOUNTER — Other Ambulatory Visit: Payer: Self-pay | Admitting: Cardiology

## 2015-01-05 ENCOUNTER — Other Ambulatory Visit: Payer: Self-pay | Admitting: Cardiology

## 2015-01-11 DIAGNOSIS — T783XXA Angioneurotic edema, initial encounter: Secondary | ICD-10-CM | POA: Diagnosis not present

## 2015-01-27 DIAGNOSIS — T783XXA Angioneurotic edema, initial encounter: Secondary | ICD-10-CM | POA: Diagnosis not present

## 2015-01-27 DIAGNOSIS — Z91013 Allergy to seafood: Secondary | ICD-10-CM | POA: Diagnosis not present

## 2015-01-27 DIAGNOSIS — J309 Allergic rhinitis, unspecified: Secondary | ICD-10-CM | POA: Diagnosis not present

## 2015-01-27 DIAGNOSIS — Z91010 Allergy to peanuts: Secondary | ICD-10-CM | POA: Diagnosis not present

## 2015-02-02 ENCOUNTER — Other Ambulatory Visit: Payer: Self-pay | Admitting: Cardiology

## 2015-02-11 DIAGNOSIS — R3915 Urgency of urination: Secondary | ICD-10-CM | POA: Diagnosis not present

## 2015-02-11 DIAGNOSIS — R312 Other microscopic hematuria: Secondary | ICD-10-CM | POA: Diagnosis not present

## 2015-02-14 ENCOUNTER — Ambulatory Visit: Payer: PRIVATE HEALTH INSURANCE | Admitting: Physical Medicine & Rehabilitation

## 2015-02-14 ENCOUNTER — Encounter: Payer: Medicare Other | Attending: Physical Medicine and Rehabilitation | Admitting: Physical Medicine & Rehabilitation

## 2015-02-14 ENCOUNTER — Encounter: Payer: Self-pay | Admitting: Physical Medicine & Rehabilitation

## 2015-02-14 VITALS — BP 114/41 | HR 53 | Resp 14

## 2015-02-14 DIAGNOSIS — M4712 Other spondylosis with myelopathy, cervical region: Secondary | ICD-10-CM | POA: Diagnosis not present

## 2015-02-14 DIAGNOSIS — M069 Rheumatoid arthritis, unspecified: Secondary | ICD-10-CM

## 2015-02-14 DIAGNOSIS — Z79899 Other long term (current) drug therapy: Secondary | ICD-10-CM | POA: Diagnosis not present

## 2015-02-14 DIAGNOSIS — Z5181 Encounter for therapeutic drug level monitoring: Secondary | ICD-10-CM | POA: Insufficient documentation

## 2015-02-14 DIAGNOSIS — M4316 Spondylolisthesis, lumbar region: Secondary | ICD-10-CM | POA: Diagnosis not present

## 2015-02-14 MED ORDER — OXYCODONE-ACETAMINOPHEN 7.5-325 MG PO TABS: 1.0000 | ORAL_TABLET | ORAL | Status: DC | PRN

## 2015-02-14 NOTE — Patient Instructions (Addendum)
CONTACT DR. HAWKES REGARDING YOUR MOUTH LESIONS/PAIN AND LEG LESIONS.  IF NOTHING ELSE SEE HER FOR ROUTINE FOLLOW UP.

## 2015-02-14 NOTE — Progress Notes (Signed)
Subjective:    Patient ID: Angela [name], female    DOB: 07/16/1936, 79 y.o.   MRN: [id number]  HPI   Angela [name] is back regarding her chronic pain and RA. She has had struggles since January with mouth lesions/pain/dry mouth. She was told it may be a reaction she's having, but the symptom have continued. She was given amoxicillin recently for ?abscess (although she states the area was never examined.). She feels that she developed a rash on her legs after starting the amoxil.  She is going next week for a tooth extraction.   Angela [name] continues on her current pain medication which has been effective. She is using a RW for gait. Her mood has been a little bit down, but she feels that she can battle through things.   Pain Inventory Average Pain 9 Pain Right Now 10 My pain is sharp and burning  In the last 24 hours, has pain interfered with the following? General activity 9 Relation with others 9 Enjoyment of life 9 What TIME of day is your pain at its worst? moring and evening Sleep (in general) Fair  Pain is worse with: walking, bending and standing Pain improves with: rest and medication Relief from Meds: 6  Mobility walk without assistance use a walker  Function disabled: date disabled . I need assistance with the following:  dressing, bathing, meal prep, household duties and shopping  Neuro/Psych bladder control problems numbness anxiety  Prior Studies Any changes since last visit?  no  Physicians involved in your care Any changes since last visit?  no   Family History  Problem Relation Age of Onset  . Cancer Mother     lung cancer  . Cancer Brother     melanoma  . Arrhythmia Mother   . Heart attack Neg Hx    History   Social History  . Marital Status: Married    Spouse Name: N/A  . Number of Children: N/A  . Years of Education: N/A   Social History Main Topics  . Smoking status: Never Smoker   . Smokeless tobacco: Never Used  . Alcohol Use: No  . Drug  Use: No  . Sexual Activity: Not on file   Other Topics Concern  . None   Social History Narrative   Past Surgical History  Procedure Laterality Date  . Fracture surgery  09/2011    left ankle screw and pin removed  . Cardiac catheterization  09/2012    patent LAD stent and otherwise normal coronary arteries   Past Medical History  Diagnosis Date  . Cervical stenosis of spine   . Rheumatoid arthritis(714.0)   . Spondylolisthesis of lumbar region   . A-fib   . Asthma   . Depression   . Dyslipidemia   . GERD (gastroesophageal reflux disease)   . Obesity   . PVC's (premature ventricular contractions)   . PUD (peptic ulcer disease)     can not take aspirin  . Osteoporosis     alendronate since 2012  . Macular degeneration   . Aortic stenosis     mild by echo 03/2012  . Hypertension   . CAD (coronary artery disease)     s/p PCI with BMS to mid LAD--2/08 not on aspirin due to frequent ulcers.  Repeat cath for CP 09/2012 with patent LAD stent and otherwise normal coronary arteries   BP 114/41 mmHg  Pulse 53  Resp 14  SpO2 96%  Opioid Risk Score:   Fall Risk  Score: Moderate Fall Risk (6-13 points) (previously educated and given handout)`1  Depression screen PHQ 2/9  Depression screen PHQ 2/9 02/14/2015  Decreased Interest 2  Down, Depressed, Hopeless 2  PHQ - 2 Score 4  Altered sleeping 2  Tired, decreased energy 3  Change in appetite 2  Feeling bad or failure about yourself  2  Trouble concentrating 2  Moving slowly or fidgety/restless 1  Suicidal thoughts 0  PHQ-9 Score 16    Review of Systems  Cardiovascular: Positive for leg swelling.  Genitourinary:       Bladder control problems  Skin: Positive for rash.  Neurological: Positive for numbness.  Psychiatric/Behavioral: The patient is nervous/anxious.   All other systems reviewed and are negative.      Objective:   Physical Exam  Left oral, ulcerated appearin lesion left cheek. She ambulated with  the rolling walker. She takes longer steps and is more stable. Changed directions well. She can extend elbow to -30 degrees. Her cervical ROM appears at baseline.  Neurological: She has normal reflexes. No cranial nerve deficit or sensory deficit.  Motor function appears near baseline. Overall dynamic balance much improved.  Skin: vasculitic lesions, petechiae over distal lower exts from the knee down. Psychiatric: She has a normal mood and affect. Her behavior is normal. Judgment and thought content normal.   Assessment & Plan:   ASSESSMENT:  1. History of cervical stenosis/spondylosis with myelopathy.  2. Rheumatoid arthritis. ?more systemic or drug related manifestations (HSV ulcer?) 3. Lumbar spondylolisthesis.  4. Gait disorder related to the above, recent fall with renal failure.   PLAN:  1. She was given one prescription for Percocet 7.5/325 1 p.o. q.6  hours p.r.n., 120. A second rx was given for next month.  2. Continue with lidoderm and voltaren gel. lidoderm was reordered.  3. Recommend f/u with her rheumatologist regarding her mouth lesions and vasculitic lesions on legs.   4. Follow up in 2 months. Thirty minutes of face to face patient care time were spent during this visit. All questions were encouraged and answered.

## 2015-02-16 NOTE — Progress Notes (Signed)
Cardiology Office Note   Date:  02/17/2015   ID:  Angela [name], DOB 28-Dec-1935, MRN [id number]  PCP:  Irven Shelling, MD    Chief Complaint  Patient presents with  . Aortic Stenosis  . Follow-up    hypertension  . Coronary Artery Disease      History of Present Illness: Angela [name] is a 79 y.o. female with a history of CAD, status post prior PCI to the LAD, HL, HTN, PVCs, rheumatoid arthritis, peptic ulcer disease, moderate aortic stenosis and nonsustained atrial tachycardia. She has chronic stable angina and has not had any chest pain. She has chronic DOE that is stable. She occasionally will have some mild LE edema which is fairly controlled onLasix PRN.  She denies any dizziness or syncope.  She occasionally notices a skipped beat.      Past Medical History  Diagnosis Date  . Cervical stenosis of spine   . Rheumatoid arthritis(714.0)   . Spondylolisthesis of lumbar region   . A-fib   . Asthma   . Depression   . Dyslipidemia   . GERD (gastroesophageal reflux disease)   . Obesity   . PVC's (premature ventricular contractions)   . PUD (peptic ulcer disease)     can not take aspirin  . Osteoporosis     alendronate since 2012  . Macular degeneration   . Aortic stenosis     mild by echo 03/2012  . Hypertension   . CAD (coronary artery disease)     s/p PCI with BMS to mid LAD--2/08 not on aspirin due to frequent ulcers.  Repeat cath for CP 09/2012 with patent LAD stent and otherwise normal coronary arteries    Past Surgical History  Procedure Laterality Date  . Fracture surgery  09/2011    left ankle screw and pin removed  . Cardiac catheterization  09/2012    patent LAD stent and otherwise normal coronary arteries     Current Outpatient Prescriptions  Medication Sig Dispense Refill  . acebutolol (SECTRAL) 200 MG capsule TAKE 1 CAPSULE (200 MG TOTAL) BY MOUTH 2 (TWO) TIMES DAILY. 60 capsule 3  . butalbital-acetaminophen-caffeine (FIORICET,  ESGIC) 50-325-40 MG per tablet Take 1 tablet by mouth as needed. For migraines    . Calcium Carbonate-Vitamin D (CALCIUM 500+D PO) Take 1 tablet by mouth 3 (three) times daily.    . citalopram (CELEXA) 20 MG tablet TAKE 1 TABLET DAILY 90 tablet 2  . clopidogrel (PLAVIX) 75 MG tablet TAKE 1 TABLET (75 MG TOTAL) BY MOUTH EVERY EVENING. 90 tablet 0  . diclofenac sodium (VOLTAREN) 1 % GEL Apply 2 g topically 2 (two) times daily as needed. For pain 2 Tube 5  . EPIPEN 2-PAK 0.3 MG/0.3ML SOAJ Inject 0.3 mg into the muscle daily as needed. For allergic reaction    . fluticasone (FLONASE) 50 MCG/ACT nasal spray   0  . folic acid (FOLVITE) 1 MG tablet Take 1 mg by mouth daily.     . furosemide (LASIX) 40 MG tablet TAKE 1 TABLET BY MOUTH AS NEEDED 90 tablet 1  . ibandronate (BONIVA) 150 MG tablet Take 150 mg by mouth every 30 (thirty) days.     Marland Kitchen lidocaine (LIDODERM) 5 % APPLY 1 PATCH ONTO THE SKIN DAILY REMOVE AND DISCARD PATCH WITHIN 12 HOURS OR AS DIRECTED BY PRESCRIBER 90 patch 5  . LORazepam (ATIVAN) 1 MG tablet Take 2 mg by mouth at bedtime.    Marland Kitchen LORazepam (ATIVAN) 2 MG tablet  Take 2 mg by mouth at bedtime.  5  . methotrexate (RHEUMATREX) 2.5 MG tablet Take 6 tablets by mouth once a week. On Saturdays    . NITROSTAT 0.4 MG SL tablet Place 0.4 mg under the tongue every 5 (five) minutes as needed. For chest pain    . oxyCODONE-acetaminophen (PERCOCET) 7.5-325 MG per tablet Take 1 tablet by mouth every 4 (four) hours as needed for pain. 120 tablet 0  . pantoprazole (PROTONIX) 40 MG tablet Take 40 mg by mouth daily.    . predniSONE (DELTASONE) 5 MG tablet Take 5 mg by mouth daily.    Marland Kitchen PREVIDENT 0.2 % SOLN Take by mouth at bedtime. Rinses with about 1 capful nightly    . simvastatin (ZOCOR) 40 MG tablet TAKE 1 TABLET BY MOUTH AT BEDTIME 90 tablet 1  . tolterodine (DETROL LA) 4 MG 24 hr capsule Take 4 mg by mouth daily.      No current facility-administered medications for this visit.    Allergies:    Peanut-containing drug products; Reglan; Codeine; Iohexol; Pineapple; and Shellfish allergy    Social History:  The patient  reports that she has never smoked. She has never used smokeless tobacco. She reports that she does not drink alcohol or use illicit drugs.   Family History:  The patient's family history includes Arrhythmia in her mother; Cancer in her brother and mother. There is no history of Heart attack.    ROS:  Please see the history of present illness.   Otherwise, review of systems are positive for anxiety, back pain, joint swelling, headaches and facial swelling.  .   All other systems are reviewed and negative.    PHYSICAL EXAM: VS:  BP 110/74 mmHg  Pulse 69  Ht 5\' 3"  (1.6 m)  Wt 137 lb 6.4 oz (62.324 kg)  BMI 24.35 kg/m2  SpO2 98% , BMI Body mass index is 24.35 kg/(m^2). GEN: Well nourished, well developed, in no acute distress HEENT: normal Neck: no JVD, carotid bruits, or masses Cardiac: RRR; no rubs, or gallops.  Trace LE edema.  2/6 SM at LLSB  Respiratory:  clear to auscultation bilaterally, normal work of breathing GI: soft, nontender, nondistended, + BS Skin: warm and dry, no rash Neuro:  Strength and sensation are intact Psych: euthymic mood, full affect   EKG:  EKG is not ordered today.    Recent Labs: 08/20/2014: ALT 11    Lipid Panel    Component Value Date/Time   CHOL 140 08/20/2014 1210   TRIG 88.0 08/20/2014 1210   HDL 53.90 08/20/2014 1210   CHOLHDL 3 08/20/2014 1210   VLDL 17.6 08/20/2014 1210   LDLCALC 69 08/20/2014 1210      Wt Readings from Last 3 Encounters:  02/17/15 137 lb 6.4 oz (62.324 kg)  10/11/14 148 lb (67.132 kg)  08/20/14 144 lb 6.4 oz (65.499 kg)     ASSESSMENT AND PLAN:  Bradycardia: Her Toprol was switched to Sectral for suppression of her PVC's and atrial tachycardia to avoid significant bradycardia. Bradycardia has resolved.   PVC's (premature ventricular contractions): Well controlled on beta  blocker. Coronary artery disease involving native coronary artery of native heart without angina pectoris: No angina. Continue Plavix, statin.  Aortic stenosis: Moderate by  echo. Repeat echo to reassess severity Essential hypertension, benign: Controlled. Rheumatoid arthritis: Likely contributes to her fatigue.  Dyslipidemia: Continue statin. LDL is at goal.  Recheck FLP and ALT    Current medicines are reviewed at length  with the patient today.  The patient does not have concerns regarding medicines.  The following changes have been made:  no change  Labs/ tests ordered today include: see above assessment and plan  Orders Placed This Encounter  Procedures  . Hepatic function panel  . Lipid Profile  . 2D Echocardiogram without contrast     Disposition:   FU with me in 6 months   Signed, Sueanne Margarita, MD  02/17/2015 3:04 PM    [facility] [place], [place], [place]  [address] Phone: [phone]; Fax: [phone]

## 2015-02-17 ENCOUNTER — Encounter: Payer: Self-pay | Admitting: Cardiology

## 2015-02-17 ENCOUNTER — Ambulatory Visit (INDEPENDENT_AMBULATORY_CARE_PROVIDER_SITE_OTHER): Payer: Medicare Other | Admitting: Cardiology

## 2015-02-17 VITALS — BP 110/74 | HR 69 | Ht 63.0 in | Wt 137.4 lb

## 2015-02-17 DIAGNOSIS — E785 Hyperlipidemia, unspecified: Secondary | ICD-10-CM

## 2015-02-17 DIAGNOSIS — I2510 Atherosclerotic heart disease of native coronary artery without angina pectoris: Secondary | ICD-10-CM | POA: Diagnosis not present

## 2015-02-17 DIAGNOSIS — I10 Essential (primary) hypertension: Secondary | ICD-10-CM

## 2015-02-17 DIAGNOSIS — I493 Ventricular premature depolarization: Secondary | ICD-10-CM | POA: Diagnosis not present

## 2015-02-17 DIAGNOSIS — I2583 Coronary atherosclerosis due to lipid rich plaque: Principal | ICD-10-CM

## 2015-02-17 DIAGNOSIS — I350 Nonrheumatic aortic (valve) stenosis: Secondary | ICD-10-CM

## 2015-02-17 NOTE — Patient Instructions (Signed)
Medication Instructions:  Your physician recommends that you continue on your current medications as directed. Please refer to the Current Medication list given to you today.   Labwork: Your physician recommends that you return for FASTING lab work (LFT, LIPIDS) THE SAME DAY AS YOUR ECHO.  Testing/Procedures: Your physician has requested that you have an echocardiogram. Echocardiography is a painless test that uses sound waves to create images of your heart. It provides your doctor with information about the size and shape of your heart and how well your heart's chambers and valves are working. This procedure takes approximately one hour. There are no restrictions for this procedure.  Follow-Up: Your physician wants you to follow-up in: 1 year with Dr. Radford Pax. You will receive a reminder letter in the mail two months in advance. If you don't receive a letter, please call our office to schedule the follow-up appointment.

## 2015-02-23 DIAGNOSIS — Z79899 Other long term (current) drug therapy: Secondary | ICD-10-CM | POA: Diagnosis not present

## 2015-02-23 DIAGNOSIS — M0589 Other rheumatoid arthritis with rheumatoid factor of multiple sites: Secondary | ICD-10-CM | POA: Diagnosis not present

## 2015-02-23 DIAGNOSIS — M2550 Pain in unspecified joint: Secondary | ICD-10-CM | POA: Diagnosis not present

## 2015-02-24 ENCOUNTER — Ambulatory Visit (HOSPITAL_COMMUNITY): Payer: Medicare Other | Attending: Cardiology | Admitting: Radiology

## 2015-02-24 ENCOUNTER — Other Ambulatory Visit (INDEPENDENT_AMBULATORY_CARE_PROVIDER_SITE_OTHER): Payer: Medicare Other | Admitting: *Deleted

## 2015-02-24 DIAGNOSIS — I350 Nonrheumatic aortic (valve) stenosis: Secondary | ICD-10-CM | POA: Insufficient documentation

## 2015-02-24 DIAGNOSIS — E785 Hyperlipidemia, unspecified: Secondary | ICD-10-CM

## 2015-02-24 LAB — HEPATIC FUNCTION PANEL
ALBUMIN: 3.8 g/dL (ref 3.5–5.2)
ALT: 18 U/L (ref 0–35)
AST: 17 U/L (ref 0–37)
Alkaline Phosphatase: 42 U/L (ref 39–117)
Bilirubin, Direct: 0.1 mg/dL (ref 0.0–0.3)
Total Bilirubin: 0.5 mg/dL (ref 0.2–1.2)
Total Protein: 6.3 g/dL (ref 6.0–8.3)

## 2015-02-24 LAB — LIPID PANEL
Cholesterol: 129 mg/dL (ref 0–200)
HDL: 58.8 mg/dL (ref >39.00)
LDL CALC: 53 mg/dL (ref 0–99)
NONHDL: 70.2
Total CHOL/HDL Ratio: 2
Triglycerides: 88 mg/dL (ref 0.0–149.0)
VLDL: 17.6 mg/dL (ref 0.0–40.0)

## 2015-02-24 NOTE — Progress Notes (Signed)
Echocardiogram performed.  

## 2015-02-28 DIAGNOSIS — N3944 Nocturnal enuresis: Secondary | ICD-10-CM | POA: Diagnosis not present

## 2015-02-28 DIAGNOSIS — N3946 Mixed incontinence: Secondary | ICD-10-CM | POA: Diagnosis not present

## 2015-02-28 DIAGNOSIS — R351 Nocturia: Secondary | ICD-10-CM | POA: Diagnosis not present

## 2015-03-01 ENCOUNTER — Telehealth: Payer: Self-pay | Admitting: Cardiology

## 2015-03-01 DIAGNOSIS — I350 Nonrheumatic aortic (valve) stenosis: Secondary | ICD-10-CM

## 2015-03-01 NOTE — Telephone Encounter (Signed)
New message ° ° ° ° ° °Want echo results °

## 2015-03-01 NOTE — Addendum Note (Signed)
Addended by: Harland German A on: 03/01/2015 07:54 PM   Modules accepted: Orders

## 2015-03-01 NOTE — Telephone Encounter (Signed)
Please let patient know that echo showed normal LVF with mild MR, mild to moderate TR and mild pulmonary HTN.  There was at least moderate AS but need repeat limited 2D echo with more views of AV to assess degree of AS more. Please order

## 2015-03-01 NOTE — Telephone Encounter (Signed)
Informed patient of results and verbal understanding expressed.   Repeat ECHO ordered for scheduling. Patient agrees with treatment plan.

## 2015-03-09 ENCOUNTER — Ambulatory Visit (HOSPITAL_COMMUNITY): Payer: Medicare Other | Attending: Cardiovascular Disease

## 2015-03-09 DIAGNOSIS — I350 Nonrheumatic aortic (valve) stenosis: Secondary | ICD-10-CM

## 2015-03-09 DIAGNOSIS — E785 Hyperlipidemia, unspecified: Secondary | ICD-10-CM | POA: Diagnosis not present

## 2015-03-09 DIAGNOSIS — I10 Essential (primary) hypertension: Secondary | ICD-10-CM | POA: Diagnosis not present

## 2015-03-09 DIAGNOSIS — I359 Nonrheumatic aortic valve disorder, unspecified: Secondary | ICD-10-CM | POA: Insufficient documentation

## 2015-03-24 ENCOUNTER — Other Ambulatory Visit: Payer: Self-pay | Admitting: Physical Medicine & Rehabilitation

## 2015-04-06 ENCOUNTER — Telehealth: Payer: Self-pay | Admitting: *Deleted

## 2015-04-06 NOTE — Telephone Encounter (Signed)
Pharmacist called to verify RX because the patient cut off the VOID section of the prescription.  I called [facility] back and verified that there was only one RX written for the office visit and it is ok to fill the prescription.

## 2015-04-11 DIAGNOSIS — N3944 Nocturnal enuresis: Secondary | ICD-10-CM | POA: Diagnosis not present

## 2015-04-11 DIAGNOSIS — N3946 Mixed incontinence: Secondary | ICD-10-CM | POA: Diagnosis not present

## 2015-04-14 ENCOUNTER — Other Ambulatory Visit: Payer: Self-pay | Admitting: Cardiology

## 2015-04-19 ENCOUNTER — Encounter: Payer: Self-pay | Admitting: Physical Medicine & Rehabilitation

## 2015-04-19 ENCOUNTER — Encounter: Payer: Medicare Other | Attending: Physical Medicine and Rehabilitation | Admitting: Physical Medicine & Rehabilitation

## 2015-04-19 ENCOUNTER — Other Ambulatory Visit: Payer: Self-pay | Admitting: Physical Medicine & Rehabilitation

## 2015-04-19 VITALS — BP 130/70 | HR 66 | Resp 14

## 2015-04-19 DIAGNOSIS — G894 Chronic pain syndrome: Secondary | ICD-10-CM

## 2015-04-19 DIAGNOSIS — M069 Rheumatoid arthritis, unspecified: Secondary | ICD-10-CM | POA: Diagnosis not present

## 2015-04-19 DIAGNOSIS — M4712 Other spondylosis with myelopathy, cervical region: Secondary | ICD-10-CM | POA: Diagnosis not present

## 2015-04-19 DIAGNOSIS — M4316 Spondylolisthesis, lumbar region: Secondary | ICD-10-CM

## 2015-04-19 DIAGNOSIS — I2510 Atherosclerotic heart disease of native coronary artery without angina pectoris: Secondary | ICD-10-CM | POA: Diagnosis not present

## 2015-04-19 DIAGNOSIS — Z5181 Encounter for therapeutic drug level monitoring: Secondary | ICD-10-CM

## 2015-04-19 DIAGNOSIS — Z79899 Other long term (current) drug therapy: Secondary | ICD-10-CM | POA: Diagnosis not present

## 2015-04-19 MED ORDER — OXYCODONE-ACETAMINOPHEN 7.5-325 MG PO TABS: 1.0000 | ORAL_TABLET | ORAL | Status: DC | PRN

## 2015-04-19 NOTE — Patient Instructions (Signed)
DON'T FORGET TO USE HEAT FOR YOU LOW BACK (INCLUDING MOIST HEAT)   PLEASE CALL ME WITH ANY PROBLEMS OR QUESTIONS (#[phone]).  HAVE A GOOD DAY!

## 2015-04-19 NOTE — Progress Notes (Signed)
Subjective:    Patient ID: Angela [name], female    DOB: 01/28/1936, 79 y.o.   MRN: [id number]  HPI   Angela [name] is here in follow up of her RA and chronic pain. For the most part she's beendoing well. Her neck and low back still give her problems. She uses her percocet for pain control with reasonable results. She also likes heat. She attempts to stay active as possible. She rarely uses her wheelchair currently. She has an aid who comes in at night to help with the house chores.   Pain Inventory Average Pain 7 Pain Right Now 8 My pain is constant, dull and aching  In the last 24 hours, has pain interfered with the following? General activity 8 Relation with others 7 Enjoyment of life 9 What TIME of day is your pain at its worst? morning Sleep (in general) Fair  Pain is worse with: bending Pain improves with: rest and medication Relief from Meds: 5  Mobility walk without assistance use a walker how many minutes can you walk? 5-10 ability to climb steps?  no do you drive?  no  Function retired  Neuro/Psych bladder control problems tingling trouble walking anxiety  Prior Studies Any changes since last visit?  no  Physicians involved in your care Any changes since last visit?  no   Family History  Problem Relation Age of Onset  . Cancer Mother     lung cancer  . Cancer Brother     melanoma  . Arrhythmia Mother   . Heart attack Neg Hx    History   Social History  . Marital Status: Married    Spouse Name: N/A  . Number of Children: N/A  . Years of Education: N/A   Social History Main Topics  . Smoking status: Never Smoker   . Smokeless tobacco: Never Used  . Alcohol Use: No  . Drug Use: No  . Sexual Activity: Not on file   Other Topics Concern  . None   Social History Narrative   Past Surgical History  Procedure Laterality Date  . Fracture surgery  09/2011    left ankle screw and pin removed  . Cardiac catheterization  09/2012    patent  LAD stent and otherwise normal coronary arteries   Past Medical History  Diagnosis Date  . Cervical stenosis of spine   . Rheumatoid arthritis(714.0)   . Spondylolisthesis of lumbar region   . A-fib   . Asthma   . Depression   . Dyslipidemia   . GERD (gastroesophageal reflux disease)   . Obesity   . PVC's (premature ventricular contractions)   . PUD (peptic ulcer disease)     can not take aspirin  . Osteoporosis     alendronate since 2012  . Macular degeneration   . Aortic stenosis     mild by echo 03/2012  . Hypertension   . CAD (coronary artery disease)     s/p PCI with BMS to mid LAD--2/08 not on aspirin due to frequent ulcers.  Repeat cath for CP 09/2012 with patent LAD stent and otherwise normal coronary arteries   BP 130/70 mmHg  Pulse 66  Resp 14  SpO2 96%  Opioid Risk Score:   Fall Risk Score: Moderate Fall Risk (6-13 points)`1  Depression screen PHQ 2/9  Depression screen PHQ 2/9 02/14/2015  Decreased Interest 2  Down, Depressed, Hopeless 2  PHQ - 2 Score 4  Altered sleeping 2  Tired, decreased energy 3  Change in appetite 2  Feeling bad or failure about yourself  2  Trouble concentrating 2  Moving slowly or fidgety/restless 1  Suicidal thoughts 0  PHQ-9 Score 16     Review of Systems  Constitutional: Negative.   HENT: Negative.   Eyes: Negative.   Respiratory: Negative.   Cardiovascular: Positive for leg swelling.       Left leg and ankles swelling  Gastrointestinal: Negative.   Endocrine: Negative.   Genitourinary:       Bladder control problems   Musculoskeletal: Positive for myalgias, back pain, arthralgias and neck pain.       Right elbow pain  Skin: Negative.   Allergic/Immunologic: Negative.   Neurological:       Tingling, trouble walking  Hematological: Negative.   Psychiatric/Behavioral: Negative.        Objective:   Physical Exam   Left oral, ulcerated appearin lesion resolved.  She ambulated with the rolling walker. She  takes longer steps and is more stable. Changed directions well. She can extend elbow to -30 degrees. Her cervical ROM appears at baseline.  Neurological: She has normal reflexes. No cranial nerve deficit or sensory deficit.  Motor function appears at baseline.   Balance improved---does well with her walker Skin: vasculitic lesions, petechiae over distal lower exts from the knee down. M/S: low back tender to palpation. Needs extra time to stand. Posture is head forward, kyphotic Psychiatric: She has a normal mood and affect. Her behavior is normal. Judgment and thought content normal.   Assessment & Plan:   ASSESSMENT:  1. History of cervical stenosis/spondylosis with myelopathy.  2. Rheumatoid arthritis.   3. Lumbar spondylolisthesis.  4. Gait disorder related to the above, recent fall with renal failure.   PLAN:  1. She was given one prescription for Percocet 7.5/325 1 p.o. q.6  hours p.r.n., 120.  .  2. Continue with lidoderm and voltaren gel.   3. Rheumatology care ongoing.   4. Follow up in 3 months. Thirty minutes of face to face patient care time were spent during this visit. All questions were encouraged and answered. Encouraged ongoing exercise as SAFELY possible. Walker use at all times.

## 2015-04-20 DIAGNOSIS — Z0000 Encounter for general adult medical examination without abnormal findings: Secondary | ICD-10-CM | POA: Diagnosis not present

## 2015-04-20 DIAGNOSIS — K219 Gastro-esophageal reflux disease without esophagitis: Secondary | ICD-10-CM | POA: Diagnosis not present

## 2015-04-20 DIAGNOSIS — E780 Pure hypercholesterolemia: Secondary | ICD-10-CM | POA: Diagnosis not present

## 2015-04-20 DIAGNOSIS — I10 Essential (primary) hypertension: Secondary | ICD-10-CM | POA: Diagnosis not present

## 2015-04-20 DIAGNOSIS — M810 Age-related osteoporosis without current pathological fracture: Secondary | ICD-10-CM | POA: Diagnosis not present

## 2015-04-20 DIAGNOSIS — Z1389 Encounter for screening for other disorder: Secondary | ICD-10-CM | POA: Diagnosis not present

## 2015-04-20 LAB — PMP ALCOHOL METABOLITE (ETG): ETGU: NEGATIVE ng/mL

## 2015-04-24 LAB — OXYCODONE, URINE (LC/MS-MS)
NOROXYCODONE, UR: 988 ng/mL (ref <50)
OXYCODONE, UR: 240 ng/mL (ref <50)
OXYMORPHONE, URINE: NEGATIVE ng/mL — AB (ref <50)

## 2015-04-24 LAB — BENZODIAZEPINES (GC/LC/MS), URINE
ALPRAZOLAMU: NEGATIVE ng/mL (ref <25)
CLONAZEPAU: NEGATIVE ng/mL (ref <25)
FLURAZEPAMU: NEGATIVE ng/mL (ref <50)
Lorazepam (GC/LC/MS), ur confirm: 819 ng/mL (ref <50)
Midazolam (GC/LC/MS), ur confirm: NEGATIVE ng/mL (ref <50)
NORDIAZEPAMU: NEGATIVE ng/mL (ref <50)
Oxazepam (GC/LC/MS), ur confirm: NEGATIVE ng/mL (ref <50)
Temazepam (GC/LC/MS), ur confirm: NEGATIVE ng/mL (ref <50)
Triazolam metabolite (GC/LC/MS), ur confirm: NEGATIVE ng/mL (ref <50)

## 2015-04-26 LAB — PRESCRIPTION MONITORING PROFILE (SOLSTAS)
AMPHETAMINE/METH: NEGATIVE ng/mL
BARBITURATE SCREEN, URINE: NEGATIVE ng/mL
Buprenorphine, Urine: NEGATIVE ng/mL
CREATININE, URINE: 36.65 mg/dL (ref >20.0)
Cannabinoid Scrn, Ur: NEGATIVE ng/mL
Carisoprodol, Urine: NEGATIVE ng/mL
Cocaine Metabolites: NEGATIVE ng/mL
Fentanyl, Ur: NEGATIVE ng/mL
MDMA URINE: NEGATIVE ng/mL
Meperidine, Ur: NEGATIVE ng/mL
Methadone Screen, Urine: NEGATIVE ng/mL
Nitrites, Initial: NEGATIVE ug/mL
Opiate Screen, Urine: NEGATIVE ng/mL
PH URINE, INITIAL: 6.7 pH (ref 4.5–8.9)
PROPOXYPHENE: NEGATIVE ng/mL
TAPENTADOLUR: NEGATIVE ng/mL
Tramadol Scrn, Ur: NEGATIVE ng/mL
Zolpidem, Urine: NEGATIVE ng/mL

## 2015-04-27 DIAGNOSIS — L814 Other melanin hyperpigmentation: Secondary | ICD-10-CM | POA: Diagnosis not present

## 2015-04-27 DIAGNOSIS — D225 Melanocytic nevi of trunk: Secondary | ICD-10-CM | POA: Diagnosis not present

## 2015-04-27 DIAGNOSIS — L821 Other seborrheic keratosis: Secondary | ICD-10-CM | POA: Diagnosis not present

## 2015-04-27 DIAGNOSIS — L820 Inflamed seborrheic keratosis: Secondary | ICD-10-CM | POA: Diagnosis not present

## 2015-04-27 DIAGNOSIS — L905 Scar conditions and fibrosis of skin: Secondary | ICD-10-CM | POA: Diagnosis not present

## 2015-04-29 ENCOUNTER — Other Ambulatory Visit: Payer: Self-pay | Admitting: Cardiology

## 2015-04-29 ENCOUNTER — Other Ambulatory Visit: Payer: Self-pay

## 2015-04-29 MED ORDER — CLOPIDOGREL BISULFATE 75 MG PO TABS: ORAL_TABLET | ORAL | Status: DC

## 2015-05-04 NOTE — Progress Notes (Signed)
Urine drug screen for this encounter is consistent for prescribed medication 

## 2015-05-10 DIAGNOSIS — M810 Age-related osteoporosis without current pathological fracture: Secondary | ICD-10-CM | POA: Diagnosis not present

## 2015-05-25 DIAGNOSIS — M0589 Other rheumatoid arthritis with rheumatoid factor of multiple sites: Secondary | ICD-10-CM | POA: Diagnosis not present

## 2015-05-25 DIAGNOSIS — Z79899 Other long term (current) drug therapy: Secondary | ICD-10-CM | POA: Diagnosis not present

## 2015-05-25 DIAGNOSIS — M2550 Pain in unspecified joint: Secondary | ICD-10-CM | POA: Diagnosis not present

## 2015-06-08 DIAGNOSIS — R110 Nausea: Secondary | ICD-10-CM | POA: Diagnosis not present

## 2015-06-08 DIAGNOSIS — R6881 Early satiety: Secondary | ICD-10-CM | POA: Diagnosis not present

## 2015-06-10 ENCOUNTER — Other Ambulatory Visit: Payer: Self-pay | Admitting: Cardiology

## 2015-06-28 ENCOUNTER — Other Ambulatory Visit: Payer: Self-pay

## 2015-06-28 DIAGNOSIS — Z1231 Encounter for screening mammogram for malignant neoplasm of breast: Secondary | ICD-10-CM

## 2015-07-05 DIAGNOSIS — K3184 Gastroparesis: Secondary | ICD-10-CM | POA: Diagnosis not present

## 2015-07-05 DIAGNOSIS — Z23 Encounter for immunization: Secondary | ICD-10-CM | POA: Diagnosis not present

## 2015-07-20 ENCOUNTER — Encounter: Payer: Medicare Other | Attending: Physical Medicine and Rehabilitation | Admitting: Physical Medicine & Rehabilitation

## 2015-07-20 ENCOUNTER — Encounter: Payer: Self-pay | Admitting: Physical Medicine & Rehabilitation

## 2015-07-20 VITALS — BP 114/61 | HR 69 | Resp 14

## 2015-07-20 DIAGNOSIS — M069 Rheumatoid arthritis, unspecified: Secondary | ICD-10-CM | POA: Diagnosis not present

## 2015-07-20 DIAGNOSIS — M4316 Spondylolisthesis, lumbar region: Secondary | ICD-10-CM | POA: Diagnosis not present

## 2015-07-20 DIAGNOSIS — K5900 Constipation, unspecified: Secondary | ICD-10-CM | POA: Insufficient documentation

## 2015-07-20 DIAGNOSIS — I2510 Atherosclerotic heart disease of native coronary artery without angina pectoris: Secondary | ICD-10-CM

## 2015-07-20 DIAGNOSIS — Z79899 Other long term (current) drug therapy: Secondary | ICD-10-CM | POA: Diagnosis not present

## 2015-07-20 DIAGNOSIS — Z5181 Encounter for therapeutic drug level monitoring: Secondary | ICD-10-CM | POA: Insufficient documentation

## 2015-07-20 DIAGNOSIS — K5909 Other constipation: Secondary | ICD-10-CM

## 2015-07-20 MED ORDER — LIDOCAINE 5 % EX PTCH: MEDICATED_PATCH | CUTANEOUS | Status: DC

## 2015-07-20 MED ORDER — DICLOFENAC SODIUM 1 % TD GEL: 2.0000 g | Freq: Two times a day (BID) | TRANSDERMAL | Status: DC | PRN

## 2015-07-20 MED ORDER — OXYCODONE-ACETAMINOPHEN 7.5-325 MG PO TABS: 1.0000 | ORAL_TABLET | Freq: Four times a day (QID) | ORAL | Status: DC | PRN

## 2015-07-20 NOTE — Progress Notes (Signed)
Subjective:    Patient ID: Angela [name], female    DOB: 1936/04/22, 79 y.o.   MRN: [id number]  HPI   Angela [name] is here in follow up of her chronic pain. She has been struggling with gastroparesis which has caused her to eat less and lose weight. She also states she's constipated. She feels that the symptoms have been ongoing for the last 3 months or so. In questioning her, she was started on [place] about 3 months ago as well. It has helped her urinary symptoms.   She reports that she has intermittent problems with constipation. She required an enema over the weekend. She is taking a stool softener and a probiotic but not always regularly.  She has noticed some increase in her left shoulder pain over the last few months. It bothers her more at night time. It isn't severe, but she wanted to let me know.   Pain Inventory Average Pain 3 Pain Right Now 6 My pain is burning, dull, stabbing and aching  In the last 24 hours, has pain interfered with the following? General activity 8 Relation with others 8 Enjoyment of life 9 What TIME of day is your pain at its worst? morning, night Sleep (in general) Fair  Pain is worse with: walking, bending and unsure Pain improves with: heat/ice, medication and injections Relief from Meds: 7  Mobility walk with assistance use a walker how many minutes can you walk? 10-15 ability to climb steps?  no do you drive?  no  Function retired I need assistance with the following:  feeding, dressing, bathing, toileting, meal prep, household duties and shopping  Neuro/Psych bladder control problems bowel control problems weakness dizziness anxiety  Prior Studies Any changes since last visit?  no  Physicians involved in your care Any changes since last visit?  no   Family History  Problem Relation Age of Onset  . Cancer Mother     lung cancer  . Cancer Brother     melanoma  . Arrhythmia Mother   . Heart attack Neg Hx    Social  History   Social History  . Marital Status: Married    Spouse Name: N/A  . Number of Children: N/A  . Years of Education: N/A   Social History Main Topics  . Smoking status: Never Smoker   . Smokeless tobacco: Never Used  . Alcohol Use: No  . Drug Use: No  . Sexual Activity: Not Asked   Other Topics Concern  . None   Social History Narrative   Past Surgical History  Procedure Laterality Date  . Fracture surgery  09/2011    left ankle screw and pin removed  . Cardiac catheterization  09/2012    patent LAD stent and otherwise normal coronary arteries   Past Medical History  Diagnosis Date  . Cervical stenosis of spine   . Rheumatoid arthritis(714.0)   . Spondylolisthesis of lumbar region   . A-fib   . Asthma   . Depression   . Dyslipidemia   . GERD (gastroesophageal reflux disease)   . Obesity   . PVC's (premature ventricular contractions)   . PUD (peptic ulcer disease)     can not take aspirin  . Osteoporosis     alendronate since 2012  . Macular degeneration   . Aortic stenosis     mild by echo 03/2012  . Hypertension   . CAD (coronary artery disease)     s/p PCI with BMS to mid LAD--2/08  not on aspirin due to frequent ulcers.  Repeat cath for CP 09/2012 with patent LAD stent and otherwise normal coronary arteries   BP 114/61 mmHg  Pulse 69  Resp 14  SpO2 99%  Opioid Risk Score:   Fall Risk Score:  `1  Depression screen PHQ 2/9  Depression screen PHQ 2/9 02/14/2015  Decreased Interest 2  Down, Depressed, Hopeless 2  PHQ - 2 Score 4  Altered sleeping 2  Tired, decreased energy 3  Change in appetite 2  Feeling bad or failure about yourself  2  Trouble concentrating 2  Moving slowly or fidgety/restless 1  Suicidal thoughts 0  PHQ-9 Score 16     Review of Systems  Constitutional: Positive for appetite change and unexpected weight change.  Cardiovascular: Positive for leg swelling.  Gastrointestinal: Positive for nausea and constipation.    Genitourinary: Positive for dysuria.  Neurological: Positive for dizziness and weakness.  Hematological: Bruises/bleeds easily.  Psychiatric/Behavioral: The patient is nervous/anxious.   All other systems reviewed and are negative.      Objective:   Physical Exam  Left oral, ulcerated appearin lesion resolved.  She ambulated with the rolling walker. She takes longer steps and is  stable. Changed directions well. She can extend elbow to -30 degrees. Her cervical ROM appears at baseline.  Neurological: She has normal reflexes. No cranial nerve deficit or sensory deficit.  Motor function appears at baseline. Balance improved  Skin: vasculitic lesions, petechiae over distal lower exts from the knee down.  M/S: low back tender to palpation. Needs extra time to stand. Posture is head forward, kyphotic. Right elbow with hematoma and skin tear Psychiatric: She has a normal mood and affect. Her behavior is normal. Judgment and thought content normal.   Assessment & Plan:   ASSESSMENT:  1. History of cervical stenosis/spondylosis with myelopathy.  2. Rheumatoid arthritis.  3. Lumbar spondylolisthesis.  4. Gait disorder related to the above, recent fall with renal failure.  5. Constipation, ?gastroparesis  PLAN:  1. She was given one prescription for Percocet 7.5/325 1 p.o. q.6  hours p.r.n., 120. .  2. Continue with lidoderm and voltaren gel.  3. Discussed her bowel regimen and need for regular bm's every 1-2 days---her constipation may be triggering some of the gastroparesis symptoms. She should try a regular softener with laxative at night and titrate to effect/tolerance. It also may be worthwhile holding the toviaz for a period of time to see if her gastroparesis symptoms improve---she'll need to discuss with her primary and urologist.  4. Follow up in 3 months. Thirty minutes of face to face patient care time were spent during this visit. All questions were encouraged and answered.

## 2015-07-20 NOTE — Patient Instructions (Signed)
SENOKOT-S, 1-3 AT NIGHT IF YOU PLAN ON EMPTYING YOUR BOWELS IN THE AM.   FIBER  CONTINUE PROBIOTIC

## 2015-08-01 DIAGNOSIS — Z961 Presence of intraocular lens: Secondary | ICD-10-CM | POA: Diagnosis not present

## 2015-08-02 DIAGNOSIS — K219 Gastro-esophageal reflux disease without esophagitis: Secondary | ICD-10-CM | POA: Diagnosis not present

## 2015-08-02 DIAGNOSIS — K5900 Constipation, unspecified: Secondary | ICD-10-CM | POA: Diagnosis not present

## 2015-08-02 DIAGNOSIS — K3184 Gastroparesis: Secondary | ICD-10-CM | POA: Diagnosis not present

## 2015-08-16 DIAGNOSIS — M2550 Pain in unspecified joint: Secondary | ICD-10-CM | POA: Diagnosis not present

## 2015-08-16 DIAGNOSIS — K210 Gastro-esophageal reflux disease with esophagitis: Secondary | ICD-10-CM | POA: Diagnosis not present

## 2015-08-16 DIAGNOSIS — Z79899 Other long term (current) drug therapy: Secondary | ICD-10-CM | POA: Diagnosis not present

## 2015-08-16 DIAGNOSIS — M0579 Rheumatoid arthritis with rheumatoid factor of multiple sites without organ or systems involvement: Secondary | ICD-10-CM | POA: Diagnosis not present

## 2015-09-19 ENCOUNTER — Ambulatory Visit
Admission: RE | Admit: 2015-09-19 | Discharge: 2015-09-19 | Disposition: A | Discharge status: Home / Self Care | Payer: Medicare Other | Source: Ambulatory Visit

## 2015-09-19 DIAGNOSIS — Z1231 Encounter for screening mammogram for malignant neoplasm of breast: Secondary | ICD-10-CM

## 2015-09-20 ENCOUNTER — Telehealth: Payer: Self-pay

## 2015-09-20 NOTE — Telephone Encounter (Signed)
Prior authorization done for lidocaine via cover my meds.  Waiting response.

## 2015-09-22 ENCOUNTER — Other Ambulatory Visit: Payer: Self-pay | Admitting: Gastroenterology

## 2015-09-22 DIAGNOSIS — R1310 Dysphagia, unspecified: Secondary | ICD-10-CM | POA: Diagnosis not present

## 2015-09-22 DIAGNOSIS — K3184 Gastroparesis: Secondary | ICD-10-CM | POA: Diagnosis not present

## 2015-09-28 ENCOUNTER — Ambulatory Visit
Admission: RE | Admit: 2015-09-28 | Discharge: 2015-09-28 | Disposition: A | Discharge status: Home / Self Care | Payer: Medicare Other | Source: Ambulatory Visit | Attending: Gastroenterology | Admitting: Gastroenterology

## 2015-09-28 DIAGNOSIS — R1310 Dysphagia, unspecified: Secondary | ICD-10-CM

## 2015-09-28 DIAGNOSIS — K219 Gastro-esophageal reflux disease without esophagitis: Secondary | ICD-10-CM | POA: Diagnosis not present

## 2015-10-12 DIAGNOSIS — R1310 Dysphagia, unspecified: Secondary | ICD-10-CM | POA: Diagnosis not present

## 2015-10-19 ENCOUNTER — Encounter: Payer: Medicare Other | Attending: Physical Medicine and Rehabilitation | Admitting: Physical Medicine & Rehabilitation

## 2015-10-19 ENCOUNTER — Encounter: Payer: Self-pay | Admitting: Physical Medicine & Rehabilitation

## 2015-10-19 VITALS — BP 126/78 | HR 67 | Resp 14

## 2015-10-19 DIAGNOSIS — I2510 Atherosclerotic heart disease of native coronary artery without angina pectoris: Secondary | ICD-10-CM

## 2015-10-19 DIAGNOSIS — M05712 Rheumatoid arthritis with rheumatoid factor of left shoulder without organ or systems involvement: Secondary | ICD-10-CM

## 2015-10-19 DIAGNOSIS — Z79899 Other long term (current) drug therapy: Secondary | ICD-10-CM | POA: Insufficient documentation

## 2015-10-19 DIAGNOSIS — Z5181 Encounter for therapeutic drug level monitoring: Secondary | ICD-10-CM | POA: Insufficient documentation

## 2015-10-19 DIAGNOSIS — M4316 Spondylolisthesis, lumbar region: Secondary | ICD-10-CM | POA: Diagnosis not present

## 2015-10-19 DIAGNOSIS — M05711 Rheumatoid arthritis with rheumatoid factor of right shoulder without organ or systems involvement: Secondary | ICD-10-CM | POA: Diagnosis not present

## 2015-10-19 DIAGNOSIS — M069 Rheumatoid arthritis, unspecified: Secondary | ICD-10-CM | POA: Diagnosis not present

## 2015-10-19 MED ORDER — OXYCODONE-ACETAMINOPHEN 7.5-325 MG PO TABS: 1.0000 | ORAL_TABLET | Freq: Four times a day (QID) | ORAL | Status: DC | PRN

## 2015-10-19 MED ORDER — LIDOCAINE 5 % EX PTCH: MEDICATED_PATCH | CUTANEOUS | Status: DC

## 2015-10-19 MED ORDER — OXYCODONE-ACETAMINOPHEN 7.5-325 MG PO TABS
1.0000 | ORAL_TABLET | Freq: Four times a day (QID) | ORAL | Status: DC | PRN
Start: 2015-10-19 — End: 2016-04-03

## 2015-10-19 MED ORDER — DICLOFENAC SODIUM 1 % TD GEL: 2.0000 g | Freq: Two times a day (BID) | TRANSDERMAL | Status: DC | PRN

## 2015-10-19 NOTE — Patient Instructions (Signed)
PLEASE CALL ME WITH ANY PROBLEMS OR QUESTIONS (#[phone]). HAVE A HAPPY HOLIDAY SEASON!!!    

## 2015-10-19 NOTE — Progress Notes (Signed)
Subjective:    Patient ID: Angela [name], female    DOB: [birth year], 79 y.o.   MRN: [id number]  HPI Ericia is here in follow up of her RA and cervical spondylosis. She hasn't had any falls since I last saw her, but she's had some near misses. She was taking her dog out during the last episode.   She is using her percocet sparingly. She tries to get some exercise at home but is more limited due to the shorter days and colder weathre. She still has a caregiver who comes in every day and help around the house. She has a stationary bike.  She has continued to struggle with appetite and weight loss. She had a recent barium swallow which showed only some lower cervical narrowing of the esophagus. She may need an EGD.  Pain Inventory Average Pain 7 Pain Right Now 7 My pain is dull and aching  In the last 24 hours, has pain interfered with the following? General activity 8 Relation with others 8 Enjoyment of life 8 What TIME of day is your pain at its worst? morning Sleep (in general) Good  Pain is worse with: walking, bending and some activites Pain improves with: rest, heat/ice and medication Relief from Meds: 8  Mobility walk with assistance use a walker ability to climb steps?  yes do you drive?  yes  Function retired I need assistance with the following:  bathing  Neuro/Psych bladder control problems bowel control problems numbness trouble walking dizziness anxiety  Prior Studies Any changes since last visit?  no  Physicians involved in your care Any changes since last visit?  no   Family History  Problem Relation Age of Onset  . Cancer Mother     lung cancer  . Cancer Brother     melanoma  . Arrhythmia Mother   . Heart attack Neg Hx    Social History   Social History  . Marital Status: Married    Spouse Name: N/A  . Number of Children: N/A  . Years of Education: N/A   Social History Main Topics  . Smoking status: Never Smoker   . Smokeless  tobacco: Never Used  . Alcohol Use: No  . Drug Use: No  . Sexual Activity: Not Asked   Other Topics Concern  . None   Social History Narrative   Past Surgical History  Procedure Laterality Date  . Fracture surgery  09/2011    left ankle screw and pin removed  . Cardiac catheterization  09/2012    patent LAD stent and otherwise normal coronary arteries   Past Medical History  Diagnosis Date  . Cervical stenosis of spine   . Rheumatoid arthritis(714.0)   . Spondylolisthesis of lumbar region   . A-fib ([place])   . Asthma   . Depression   . Dyslipidemia   . GERD (gastroesophageal reflux disease)   . Obesity   . PVC's (premature ventricular contractions)   . PUD (peptic ulcer disease)     can not take aspirin  . Osteoporosis     alendronate since 2012  . Macular degeneration   . Aortic stenosis     mild by echo 03/2012  . Hypertension   . CAD (coronary artery disease)     s/p PCI with BMS to mid LAD--2/08 not on aspirin due to frequent ulcers.  Repeat cath for CP 09/2012 with patent LAD stent and otherwise normal coronary arteries   BP 126/78 mmHg  Pulse 67  Resp 14  SpO2 98%  Opioid Risk Score:   Fall Risk Score:  `1  Depression screen PHQ 2/9  Depression screen PHQ 2/9 02/14/2015  Decreased Interest 2  Down, Depressed, Hopeless 2  PHQ - 2 Score 4  Altered sleeping 2  Tired, decreased energy 3  Change in appetite 2  Feeling bad or failure about yourself  2  Trouble concentrating 2  Moving slowly or fidgety/restless 1  Suicidal thoughts 0  PHQ-9 Score 16     Review of Systems  Constitutional: Positive for unexpected weight change.  Cardiovascular: Positive for leg swelling.  Gastrointestinal: Positive for abdominal pain and constipation.  Genitourinary:       Incontinence Retention   Musculoskeletal: Positive for gait problem.  Neurological: Positive for dizziness and numbness.  Hematological: Bruises/bleeds easily.  Psychiatric/Behavioral: The  patient is nervous/anxious.   All other systems reviewed and are negative.      Objective:   Physical Exam  She does seem to have lost some weight..  She ambulated with the rolling walker. She takes longer steps and is stable. Changed directions well. She can extend elbow to -30 degrees. Her cervical ROM appears at baseline.  Neurological: She has normal reflexes. No cranial nerve deficit or sensory deficit.  Motor function appears at baseline. Balance improved  Skin: vasculitic lesions, petechiae over distal lower exts from the knee down.  M/S: low back tender to palpation. Needs extra time to stand. Posture is head forward, kyphotic. Right elbow with hematoma and skin tear Psychiatric: She has a normal mood and affect. Her behavior is normal. Judgment and thought content normal.    Assessment & Plan:   ASSESSMENT:  1. History of cervical stenosis/spondylosis with myelopathy.  2. Rheumatoid arthritis.  3. Lumbar spondylolisthesis.  4. Gait disorder related to the above, recent fall with renal failure.  5. dysphagia   PLAN:  1. She was given one prescription for Percocet 7.5/325 1 p.o. q.6  hours p.r.n., 120.   2. Continue with lidoderm and voltaren gel.  3. Esophageal/dysphagia work up for GI 4. Follow up in 3 months. Thirty minutes of face to face patient care time were spent during this visit. All questions were encouraged and answered.

## 2015-10-21 ENCOUNTER — Telehealth: Payer: Self-pay | Admitting: *Deleted

## 2015-10-21 DIAGNOSIS — Z1389 Encounter for screening for other disorder: Secondary | ICD-10-CM | POA: Diagnosis not present

## 2015-10-21 DIAGNOSIS — Z0000 Encounter for general adult medical examination without abnormal findings: Secondary | ICD-10-CM | POA: Diagnosis not present

## 2015-10-21 DIAGNOSIS — I10 Essential (primary) hypertension: Secondary | ICD-10-CM | POA: Diagnosis not present

## 2015-10-21 DIAGNOSIS — K3184 Gastroparesis: Secondary | ICD-10-CM | POA: Diagnosis not present

## 2015-10-21 DIAGNOSIS — K219 Gastro-esophageal reflux disease without esophagitis: Secondary | ICD-10-CM | POA: Diagnosis not present

## 2015-10-21 DIAGNOSIS — R1310 Dysphagia, unspecified: Secondary | ICD-10-CM | POA: Diagnosis not present

## 2015-10-21 NOTE — Telephone Encounter (Signed)
[facility] is calling about not being able to get her lidocaine filled.  Prior auth in process per Yvone Neu.

## 2015-10-23 ENCOUNTER — Other Ambulatory Visit: Payer: Self-pay | Admitting: Cardiology

## 2015-10-24 NOTE — Telephone Encounter (Signed)
PA initiated by Mancel Parsons CMA

## 2015-10-27 ENCOUNTER — Encounter (HOSPITAL_COMMUNITY): Payer: Self-pay | Admitting: *Deleted

## 2015-10-27 ENCOUNTER — Other Ambulatory Visit: Payer: Self-pay | Admitting: Gastroenterology

## 2015-11-01 ENCOUNTER — Ambulatory Visit (HOSPITAL_COMMUNITY): Payer: Medicare Other | Admitting: Certified Registered Nurse Anesthetist

## 2015-11-01 ENCOUNTER — Ambulatory Visit (HOSPITAL_COMMUNITY)
Admission: RE | Admit: 2015-11-01 | Discharge: 2015-11-01 | Disposition: A | Discharge status: Home / Self Care | Payer: Medicare Other | Source: Ambulatory Visit | Attending: Gastroenterology | Admitting: Gastroenterology

## 2015-11-01 ENCOUNTER — Encounter (HOSPITAL_COMMUNITY)
Admission: RE | Disposition: A | Discharge status: Home / Self Care | Payer: Self-pay | Source: Ambulatory Visit | Attending: Gastroenterology

## 2015-11-01 ENCOUNTER — Encounter (HOSPITAL_COMMUNITY): Payer: Self-pay

## 2015-11-01 DIAGNOSIS — H3530 Unspecified macular degeneration: Secondary | ICD-10-CM | POA: Diagnosis not present

## 2015-11-01 DIAGNOSIS — Z79899 Other long term (current) drug therapy: Secondary | ICD-10-CM | POA: Diagnosis not present

## 2015-11-01 DIAGNOSIS — I2510 Atherosclerotic heart disease of native coronary artery without angina pectoris: Secondary | ICD-10-CM | POA: Diagnosis not present

## 2015-11-01 DIAGNOSIS — M069 Rheumatoid arthritis, unspecified: Secondary | ICD-10-CM | POA: Diagnosis not present

## 2015-11-01 DIAGNOSIS — J45909 Unspecified asthma, uncomplicated: Secondary | ICD-10-CM | POA: Insufficient documentation

## 2015-11-01 DIAGNOSIS — Z955 Presence of coronary angioplasty implant and graft: Secondary | ICD-10-CM | POA: Diagnosis not present

## 2015-11-01 DIAGNOSIS — Z79891 Long term (current) use of opiate analgesic: Secondary | ICD-10-CM | POA: Insufficient documentation

## 2015-11-01 DIAGNOSIS — K219 Gastro-esophageal reflux disease without esophagitis: Secondary | ICD-10-CM | POA: Insufficient documentation

## 2015-11-01 DIAGNOSIS — Z96653 Presence of artificial knee joint, bilateral: Secondary | ICD-10-CM | POA: Insufficient documentation

## 2015-11-01 DIAGNOSIS — I10 Essential (primary) hypertension: Secondary | ICD-10-CM | POA: Diagnosis not present

## 2015-11-01 DIAGNOSIS — Z7983 Long term (current) use of bisphosphonates: Secondary | ICD-10-CM | POA: Insufficient documentation

## 2015-11-01 DIAGNOSIS — M810 Age-related osteoporosis without current pathological fracture: Secondary | ICD-10-CM | POA: Insufficient documentation

## 2015-11-01 DIAGNOSIS — K3184 Gastroparesis: Secondary | ICD-10-CM | POA: Insufficient documentation

## 2015-11-01 DIAGNOSIS — I350 Nonrheumatic aortic (valve) stenosis: Secondary | ICD-10-CM | POA: Diagnosis not present

## 2015-11-01 DIAGNOSIS — R1310 Dysphagia, unspecified: Secondary | ICD-10-CM | POA: Diagnosis not present

## 2015-11-01 DIAGNOSIS — E7800 Pure hypercholesterolemia, unspecified: Secondary | ICD-10-CM | POA: Insufficient documentation

## 2015-11-01 DIAGNOSIS — I129 Hypertensive chronic kidney disease with stage 1 through stage 4 chronic kidney disease, or unspecified chronic kidney disease: Secondary | ICD-10-CM | POA: Diagnosis not present

## 2015-11-01 DIAGNOSIS — N189 Chronic kidney disease, unspecified: Secondary | ICD-10-CM | POA: Diagnosis not present

## 2015-11-01 DIAGNOSIS — Z7902 Long term (current) use of antithrombotics/antiplatelets: Secondary | ICD-10-CM | POA: Insufficient documentation

## 2015-11-01 DIAGNOSIS — Z8711 Personal history of peptic ulcer disease: Secondary | ICD-10-CM | POA: Diagnosis not present

## 2015-11-01 DIAGNOSIS — M1990 Unspecified osteoarthritis, unspecified site: Secondary | ICD-10-CM | POA: Diagnosis not present

## 2015-11-01 HISTORY — DX: Unspecified fracture of lower end of right humerus, initial encounter for closed fracture: S42.401A

## 2015-11-01 HISTORY — DX: Other reduced mobility: Z74.09

## 2015-11-01 HISTORY — PX: ESOPHAGOGASTRODUODENOSCOPY (EGD) WITH PROPOFOL: SHX5813

## 2015-11-01 SURGERY — ESOPHAGOGASTRODUODENOSCOPY (EGD) WITH PROPOFOL
Anesthesia: Monitor Anesthesia Care

## 2015-11-01 MED ORDER — PROPOFOL 10 MG/ML IV BOLUS
INTRAVENOUS | Status: DC | PRN
  Administered 2015-11-01: 50 mg via INTRAVENOUS

## 2015-11-01 MED ORDER — SODIUM CHLORIDE 0.9 % IV SOLN: INTRAVENOUS | Status: DC

## 2015-11-01 MED ORDER — LACTATED RINGERS IV SOLN
INTRAVENOUS | Status: DC
Start: 2015-11-01 — End: 2015-11-01
  Administered 2015-11-01: 1000 mL via INTRAVENOUS

## 2015-11-01 MED ORDER — PROPOFOL 10 MG/ML IV BOLUS
INTRAVENOUS | Status: AC
  Filled 2015-11-01: qty 40

## 2015-11-01 SURGICAL SUPPLY — 15 items

## 2015-11-01 NOTE — Transfer of Care (Signed)
Immediate Anesthesia Transfer of Care Note  Patient: Angela [name]  Procedure(s) Performed: Procedure(s): ESOPHAGOGASTRODUODENOSCOPY (EGD) WITH PROPOFOL w/ Dialtation (N/A)  Patient Location: PACU  Anesthesia Type:MAC  Level of Consciousness: awake, alert  and oriented  Airway & Oxygen Therapy: Patient Spontanous Breathing and Patient connected to nasal cannula oxygen  Post-op Assessment: Report given to RN and Post -op Vital signs reviewed and stable  Post vital signs: Reviewed and stable  Last Vitals:  Filed Vitals:   11/01/15 0632  BP: 119/58  Pulse: 66  Temp: 36.7 C  Resp: 25    Complications: No apparent anesthesia complications

## 2015-11-01 NOTE — Anesthesia Postprocedure Evaluation (Signed)
Anesthesia Post Note  Patient: Angela [name]  Procedure(s) Performed: Procedure(s) (LRB): ESOPHAGOGASTRODUODENOSCOPY (EGD) WITH PROPOFOL w/ Dialtation (N/A)  Patient location during evaluation: PACU Anesthesia Type: MAC Level of consciousness: awake and alert Pain management: pain level controlled Vital Signs Assessment: post-procedure vital signs reviewed and stable Respiratory status: spontaneous breathing Cardiovascular status: stable Anesthetic complications: no    Last Vitals:  Filed Vitals:   11/01/15 0800 11/01/15 0810  BP: 131/94 125/64  Pulse: 68 69  Temp:    Resp: 20 18    Last Pain:  Filed Vitals:   11/01/15 0816  PainSc: [place]

## 2015-11-01 NOTE — Anesthesia Preprocedure Evaluation (Addendum)
Anesthesia Evaluation  Patient identified by MRN, date of birth, ID band Patient awake    Reviewed: Allergy & Precautions, NPO status , Patient's Chart, lab work & pertinent test results  Airway Mallampati: II  TM Distance: >3 FB Neck ROM: Full    Dental no notable dental hx.    Pulmonary asthma ,    Pulmonary exam normal breath sounds clear to auscultation       Cardiovascular hypertension, Pt. on medications + CAD and + Cardiac Stents  Normal cardiovascular exam Rhythm:Regular Rate:Normal  BMS 2008. Cath in 2011 with no significant stenosis. Some CP at times. Very little ability to exert herself due to arthritis.   Neuro/Psych PSYCHIATRIC DISORDERS Depression negative neurological ROS     GI/Hepatic Neg liver ROS, PUD, GERD  ,  Endo/Other  negative endocrine ROS  Renal/GU Renal disease     Musculoskeletal  (+) Arthritis ,   Abdominal   Peds  Hematology negative hematology ROS (+)   Anesthesia Other Findings   Reproductive/Obstetrics negative OB ROS                            Anesthesia Physical Anesthesia Plan  ASA: III  Anesthesia Plan: MAC   Post-op Pain Management:    Induction: Intravenous  Airway Management Planned:   Additional Equipment:   Intra-op Plan:   Post-operative Plan:   Informed Consent: I have reviewed the patients History and Physical, chart, labs and discussed the procedure including the risks, benefits and alternatives for the proposed anesthesia with the patient or authorized representative who has indicated his/her understanding and acceptance.   Dental advisory given  Plan Discussed with: CRNA  Anesthesia Plan Comments:         Anesthesia Quick Evaluation

## 2015-11-01 NOTE — H&P (Signed)
  Problem: Esophageal dysphagia on oxycodone, ibandronate, and Plavix. 09/28/2015 barium esophagram showed slight narrowing of the cervical esophagus (? Extrinsic compression of the esophagus by the thyroid). 12/08/2009 esophagogastroduodenoscopy showed healed prepyloric gastric ulcer  History: The patient is a 79 year old female born 09-22-1936. The patient is scheduled to undergo diagnostic esophagogastroduodenoscopy with possible esophageal stricture dilation to evaluate esophageal dysphagia. She stopped taking Plavix 6 days ago.  Past medical history: Coronary artery disease. Bare metal stent placement in the mid left anterior descending coronary artery in February 2008. Asthma. Depression. Hypercholesterolemia. Gastroesophageal reflux. Hypertension. Rheumatoid arthritis. Peptic ulcer disease. Gastro cyst. Osteoporosis. Macular degeneration. Mild aortic valve stenosis. Cholecystectomy. Cervical spine surgery. Bilateral total knee replacement surgeries. Bilateral ankle fusion surgeries.  Medication allergies: Codeine. Seafood. Peanuts. Myrbetriq. Amoxicillin.  Exam: The patient is alert and lying comfortably on the endoscopy stretcher. Abdomen is soft and nontender to palpation. Lungs are clear to auscultation. Cardiac exam reveals a regular rhythm.  Plan: Proceed with diagnostic esophagogastroduodenoscopy with possible esophageal stricture dilation

## 2015-11-01 NOTE — Discharge Instructions (Signed)

## 2015-11-01 NOTE — Op Note (Signed)
Problem: Esophageal dysphagia on oxycodone, ibandronate, and Plavix. 09/28/2015 barium esophagram showed slight narrowing of the cervical esophagus (? Extrinsic compression of the cervical esophagus by the thyroid). Severe gastroparesis.  Endoscopist: Earle Gell  Premedication: Propofol administered by anesthesia  Procedure: The patient was placed in the left lateral decubitus position. The Pentax gastroscope was passed through the posterior hypopharynx into the proximal esophagus without difficulty. I did not visualize the vocal cords.  Esophagoscopy: The proximal, mid, and lower segments of the esophageal mucosa appeared normal. The squamocolumnar junction was irregular in appearance and noted at 37 cm from the incisor teeth. There was no endoscopic evidence for the presence of erosive esophagitis, esophageal stricture formation, or esophageal obstruction.  Gastroscopy: The stomach was filled with liquid and solid food. Retroflexed view of the gastric cardia and fundus was normal. The gastric body and gastric antrum were filled with food. I did not locate the pylorus.  Duodenoscopy: The duodenum was not examined.  Assessment: Esophageal dysphagia and severe gastroparesis associated with a normal-appearing esophagus. The patient may have narcotic associated with esophageal dysmotility.

## 2015-11-02 ENCOUNTER — Encounter (HOSPITAL_COMMUNITY): Payer: Self-pay | Admitting: Gastroenterology

## 2015-11-24 ENCOUNTER — Other Ambulatory Visit: Payer: Self-pay | Admitting: Cardiology

## 2015-11-30 DIAGNOSIS — K3184 Gastroparesis: Secondary | ICD-10-CM | POA: Diagnosis not present

## 2015-11-30 DIAGNOSIS — M2550 Pain in unspecified joint: Secondary | ICD-10-CM | POA: Diagnosis not present

## 2015-11-30 DIAGNOSIS — Z79899 Other long term (current) drug therapy: Secondary | ICD-10-CM | POA: Diagnosis not present

## 2015-11-30 DIAGNOSIS — M0579 Rheumatoid arthritis with rheumatoid factor of multiple sites without organ or systems involvement: Secondary | ICD-10-CM | POA: Diagnosis not present

## 2015-12-02 ENCOUNTER — Telehealth: Payer: Self-pay | Admitting: Cardiology

## 2015-12-02 NOTE — Telephone Encounter (Signed)
New Message   Pt c/o swelling: STAT is pt has developed SOB within 24 hours  1. How long have you been experiencing swelling? 6 to 8 months.   2. Where is the swelling located? Both anklles  3.  Are you currently taking a "fluid pill"? Yes   4.  Are you currently SOB? Seldom to never   5.  Have you traveled recently?no

## 2015-12-02 NOTE — Telephone Encounter (Signed)
Ms. Blauer saw her rheumatologist Tuesday and he recommended she make a visit with cardiology due to swelling in her feet and ankles in the last few months.  Patient is taking her Lasix as instructed and wears stockings.  She denies SOB and chest pain. No VS to report. OV scheduled with Richardson Dopp 12/07/15 for evaluation per patient request as she has not been here in almost a year. Patient grateful for callback.

## 2015-12-06 ENCOUNTER — Encounter: Payer: Self-pay | Admitting: *Deleted

## 2015-12-06 NOTE — Progress Notes (Signed)
Cardiology Office Note:    Date:  12/07/2015   ID:  Angela [name], DOB [birth year], MRN [id number]  PCP:  Irven Shelling, MD  Cardiologist:  Dr. Fransico Him   Electrophysiologist:  n/a  Chief Complaint  Patient presents with  . Leg Swelling    History of Present Illness:     Angela [name] is a 80 y.o. female with a hx of CAD, s/p prior PCI to the LAD, HL, HTN, PVCs, rheumatoid arthritis, peptic ulcer disease, aortic stenosis.  She was seen for bradycardia and Holter in 8/15 showed NSR with PACs and PVCs and ATach.  Toprol was changed to Acebutolol.  Bradycardia resolved.  Last seen by Dr. Fransico Him in 4/16.  Prior echo suggested mod AS.  Repeat limited echo demonstrated just mild AS.   Patient called in last week with increased swelling in her feet.  She returns for further evaluation.  She notes increasing LE edema over the past couple of months.  She has been taking Lasix QD since that time. Her edema is better in the AM after lying flat all night. She does walk some with her walker for exercise.  She also rides a stationary bike.  She denies exertional chest pain.  She has had a few episodes of chest discomfort in the AM after awakening.  She takes ASA for this.  She has had this for years and has not noticed any changes.  She denies orthopnea, PND. She denies syncope. She has some spinning sensation if she turns her head quickly.     Past Medical History  Diagnosis Date  . Cervical stenosis of spine   . Spondylolisthesis of lumbar region   . A-fib ([place])   . Asthma   . Depression   . Dyslipidemia   . GERD (gastroesophageal reflux disease)   . Obesity   . PVC's (premature ventricular contractions)   . PUD (peptic ulcer disease)     can not take aspirin  . Osteoporosis     alendronate since 2012  . Macular degeneration   . Aortic stenosis     mild by echo 03/2012  . Hypertension   . CAD (coronary artery disease)     s/p PCI with BMS to mid LAD--2/08 not on  aspirin due to frequent ulcers.  Repeat cath for CP 09/2012 with patent LAD stent and otherwise normal coronary arteries  . Elbow fracture, right     history of- never any surgery  . Rheumatoid arthritis(714.0)     Dr. Lenna Gilford follows  . Impaired physical mobility     due to rheumatoid arthritis "ambulates with walker" limited free standing.    Past Surgical History  Procedure Laterality Date  . Fracture surgery  09/2011    left ankle screw and pin removed  . Cardiac catheterization  09/2012    patent LAD stent and otherwise normal coronary arteries  . Cervical fusion      limited  ROM  . Tonsillectomy    . Cataract extraction, bilateral Bilateral   . Joint replacement Bilateral     BTKA  . Hand surgery Bilateral     x4 digits 2-5 both hands  . Ankle fusion Bilateral     x2 both  . Cholecystectomy    . Esophagogastroduodenoscopy (egd) with propofol N/A 11/01/2015    Procedure: ESOPHAGOGASTRODUODENOSCOPY (EGD) WITH PROPOFOL w/ Dialtation;  Surgeon: Garlan Fair, MD;  Location: [facility];  Service: Endoscopy;  Laterality: N/A;    Current Medications:  Outpatient Prescriptions Prior to Visit  Medication Sig Dispense Refill  . acebutolol (SECTRAL) 200 MG capsule TAKE 1 CAPSULE (200 MG TOTAL) BY MOUTH 2 (TWO) TIMES DAILY. 60 capsule 2  . butalbital-acetaminophen-caffeine (FIORICET, ESGIC) 50-325-40 MG per tablet Take 1 tablet by mouth every 6 (six) hours as needed for headache or migraine. For migraines    . Calcium Carbonate-Vitamin D (CALCIUM 500+D PO) Take 1 tablet by mouth 3 (three) times daily.    . citalopram (CELEXA) 20 MG tablet TAKE 1 TABLET DAILY (Patient taking differently: TAKE 1 TABLET DAILY AS NEEDED FOR STRESS) 90 tablet 2  . clopidogrel (PLAVIX) 75 MG tablet TAKE 1 TABLET (75 MG TOTAL) BY MOUTH EVERY EVENING. 90 tablet 1  . diclofenac sodium (VOLTAREN) 1 % GEL Apply 2 g topically 2 (two) times daily as needed. For pain (Patient taking differently: Apply 2 g  topically 2 (two) times daily. For pain) 3 Tube 5  . Docusate Calcium (STOOL SOFTENER PO) Take 1 tablet by mouth at bedtime.    Marland Kitchen EPIPEN 2-PAK 0.3 MG/0.3ML SOAJ Inject 0.3 mg into the muscle daily as needed (for allergic reaction).     . fluticasone (FLONASE) 50 MCG/ACT nasal spray Place 1 spray into both nostrils daily as needed for allergies.   0  . folic acid (FOLVITE) 1 MG tablet Take 1 mg by mouth daily.     Marland Kitchen lidocaine (LIDODERM) 5 % APPLY 1 PATCH ONTO THE SKIN DAILY REMOVE AND DISCARD PATCH WITHIN 12 HOURS OR AS DIRECTED BY PRESCRIBER (Patient taking differently: Place 2 patches onto the skin daily. APPLY NECK AND ELBOW) 90 patch 5  . LORazepam (ATIVAN) 2 MG tablet Take 2 mg by mouth at bedtime.  5  . methotrexate (RHEUMATREX) 2.5 MG tablet Take 15 mg by mouth once a week. On Saturdays    . NITROSTAT 0.4 MG SL tablet Place 0.4 mg under the tongue every 5 (five) minutes as needed for chest pain.     Marland Kitchen oxyCODONE-acetaminophen (PERCOCET) 7.5-325 MG tablet Take 1 tablet by mouth every 6 (six) hours as needed. (Patient taking differently: Take 1 tablet by mouth every 6 (six) hours as needed for moderate pain. ) 120 tablet 0  . pantoprazole (PROTONIX) 40 MG tablet Take 40 mg by mouth 2 (two) times daily.     . predniSONE (DELTASONE) 5 MG tablet Take 5 mg by mouth daily.    Marland Kitchen PREVIDENT 0.2 % SOLN Take 1 each by mouth at bedtime. Rinses with about 1 capful nightly    . simvastatin (ZOCOR) 40 MG tablet TAKE 1 TABLET BY MOUTH AT BEDTIME 90 tablet 1  . TOVIAZ 8 MG TB24 tablet Take 8 mg by mouth daily.    . furosemide (LASIX) 40 MG tablet TAKE 1 TABLET BY MOUTH AS NEEDED (Patient taking differently: TAKE 1/2 TABLET BY MOUTH DAILY) 90 tablet 1   No facility-administered medications prior to visit.     Allergies:   Peanut-containing drug products; Reglan; Codeine; Iohexol; Pineapple; and Shellfish allergy   Social History   Social History  . Marital Status: Widowed    Spouse Name: N/A  . Number  of Children: N/A  . Years of Education: N/A   Social History Main Topics  . Smoking status: Never Smoker   . Smokeless tobacco: Never Used  . Alcohol Use: No  . Drug Use: No  . Sexual Activity: Not Asked   Other Topics Concern  . None   Social History Narrative  Family History:  The patient's family history includes Arrhythmia in her mother; Lung cancer in her mother; Melanoma in her brother. There is no history of Heart attack.   ROS:   Please see the history of present illness.    Review of Systems  Constitution: Positive for diaphoresis and weight loss.  Cardiovascular: Positive for chest pain, irregular heartbeat and leg swelling.  Hematologic/Lymphatic: Bruises/bleeds easily.  Musculoskeletal: Positive for joint swelling.  Gastrointestinal: Positive for abdominal pain and constipation.  Neurological: Positive for dizziness and loss of balance.  Psychiatric/Behavioral: The patient is nervous/anxious.   All other systems reviewed and are negative.   Physical Exam:    VS:  BP 110/82 mmHg  Pulse 70  Ht 5\' 1"  (1.549 m)  Wt 123 lb (55.792 kg)  BMI 23.25 kg/m2  SpO2 98%   GEN: Well nourished, well developed, in no acute distress HEENT: normal Neck: no JVD, no masses Cardiac: Normal [id number], RRR; 1/6 systolic murmur RUSB, [id number] bilateral LE edema   Respiratory:  clear to auscultation bilaterally; no   rales GI: soft, nontender  MS: bilat hands with rheumatoid deformities (ulnar deviation, swan neck) Skin: warm and dry   Neuro:  no focal deficits  Psych: Alert and oriented x 3, normal affect  Wt Readings from Last 3 Encounters:  12/07/15 123 lb (55.792 kg)  11/01/15 125 lb (56.7 kg)  02/17/15 137 lb 6.4 oz (62.324 kg)      Studies/Labs Reviewed:     EKG:  EKG is  ordered today.  The ekg ordered today demonstrates NSR, HR 71, normal axis, septal Q waves, NSSTTW changes, QTc 447 ms, no changes.    Recent Labs: 02/24/2015: ALT 18   Recent Lipid Panel      Component Value Date/Time   CHOL 129 02/24/2015 1030   TRIG 88.0 02/24/2015 1030   HDL 58.80 02/24/2015 1030   CHOLHDL 2 02/24/2015 1030   VLDL 17.6 02/24/2015 1030   LDLCALC 53 02/24/2015 1030    Additional studies/ records that were reviewed today include:   Limited Echo 03/09/15 Mild AS, mean 13 mmHg, mod LAE  Echo 02/24/15 EF 55-60%, inf HK, Gr 2 DD, probable mod AS, mean 18 mmHg, MAC, mild MR, mild LAE, mild RAE, mild to mod TR, PASP 39 mmHg  Holter 8/15 NSR,PVCs, PACs, WCT (NSVT vs aberration)  LHC (11/13):  Proximal LAD stent patent, normal left main, circumflex and RCA, EF 50-55% with inferobasal akinesis, LVEDP 17 mm Hg  Echo (4/15):  Mild LVH, EF 60-65%, normal wall motion, grade 1 diastolic dysfunction, moderate aortic stenosis (mean 17 mm Hg), trivial AI, borderline dilated ascending aorta, severe LAE, mild RAE, mild TR, PASP 35 mm Hg  Nuclear (5/13):  Breast attenuation, no ischemia, EF 55%-low risk   ASSESSMENT:     1. Bilateral edema of lower extremity   2. Bradycardia   3. PVC's (premature ventricular contractions)   4. Coronary artery disease involving native coronary artery of native heart without angina pectoris   5. Aortic stenosis   6. Essential hypertension, benign   7. Dyslipidemia      PLAN:     In order of problems listed above:  1. Edema - She has mild edema that has been persistent for the last few months. She has been taking Lasix every day without significant improvement. She does not appear to be in CHF.  She does have reduced LE edema after her legs have been propped up. She has gastroparesis and has lost  30 lbs in the last 2 years.  She does not eat very well anymore.  Question if she is hypoalbuminemic.  I suspect her edema is multifactorial and related to venous insufficiency, inactivity, diastolic dysfunction, hypoalbuminemia.    -  Continue current dose of Lasix.  -  She can take an extra dose of 20 mg of Lasix prn for increasing  edema.  -  Check BMET, CBC, TSH, LFTs today.  -  Keep legs elevated.  I gave her info on the "Loung Doctor" pillow  -  Try to get compression stockings on (I asked her to see if her caretaker can help)  -  Continue to drink Boost/Ensure to help with protein loss.  2. Bradycardia - Resolved on Acebutolol.     3. PVC's (premature ventricular contractions) - Well controlled on beta blocker. She has noted more frequent palpitations.  She is on daily Lasix and no K+.  Check BMET today.   4. Coronary artery disease - She has had some atypical chest pains. These are stable without change.  ECG is unchanged.  If she has increased symptoms, arrange nuclear stress test.  Otherwise, Continue Plavix, statin.  5. Aortic stenosis - Mild by recent echo.  6. HTN - Controlled.  7. Dyslipidemia - Continue statin.     Medication Adjustments/Labs and Tests Ordered: Current medicines are reviewed at length with the patient today.  Concerns regarding medicines are outlined above.  Medication changes, Labs and Tests ordered today are outlined in the Patient Instructions noted below. Patient Instructions  Medication Instructions:  No changes.  See your medication list. If you have a day where your legs are more swollen, you can take an extra 1/2 tablet of Lasix that day only.  Labwork: Today - BMET, CBC, TSH, LFTs  Testing/Procedures: None   Follow-Up: Dr. Fransico Him or Richardson Dopp, PA-C in 4-6 weeks.   Any Other Special Instructions Will Be Listed Below (If Applicable).  Keep your legs elevated.   Try to see if your caretaker can help you get compression stockings on. It is ok to drink Ensure or Boost to help with protein.  You can drink 1-3 cans a day. If you have any further chest pain or it gets worse, call us.   Signed, Richardson Dopp, PA-C  12/07/2015 3:52 PM    [facility] [place], [place], [place]  [address] Phone: [phone]; Fax: [phone]

## 2015-12-07 ENCOUNTER — Encounter: Payer: Self-pay | Admitting: Physician Assistant

## 2015-12-07 ENCOUNTER — Ambulatory Visit (INDEPENDENT_AMBULATORY_CARE_PROVIDER_SITE_OTHER): Payer: Medicare Other | Admitting: Physician Assistant

## 2015-12-07 VITALS — BP 110/82 | HR 70 | Ht 61.0 in | Wt 123.0 lb

## 2015-12-07 DIAGNOSIS — I350 Nonrheumatic aortic (valve) stenosis: Secondary | ICD-10-CM

## 2015-12-07 DIAGNOSIS — I2510 Atherosclerotic heart disease of native coronary artery without angina pectoris: Secondary | ICD-10-CM

## 2015-12-07 DIAGNOSIS — I10 Essential (primary) hypertension: Secondary | ICD-10-CM | POA: Diagnosis not present

## 2015-12-07 DIAGNOSIS — E785 Hyperlipidemia, unspecified: Secondary | ICD-10-CM | POA: Diagnosis not present

## 2015-12-07 DIAGNOSIS — R600 Localized edema: Secondary | ICD-10-CM | POA: Diagnosis not present

## 2015-12-07 DIAGNOSIS — R001 Bradycardia, unspecified: Secondary | ICD-10-CM

## 2015-12-07 DIAGNOSIS — I493 Ventricular premature depolarization: Secondary | ICD-10-CM

## 2015-12-07 DIAGNOSIS — R609 Edema, unspecified: Secondary | ICD-10-CM | POA: Diagnosis not present

## 2015-12-07 LAB — HEPATIC FUNCTION PANEL
ALK PHOS: 54 U/L (ref 33–130)
ALT: 16 U/L (ref 6–29)
AST: 23 U/L (ref 10–35)
Albumin: 4 g/dL (ref 3.6–5.1)
BILIRUBIN INDIRECT: 0.5 mg/dL (ref 0.2–1.2)
BILIRUBIN TOTAL: 0.7 mg/dL (ref 0.2–1.2)
Bilirubin, Direct: 0.2 mg/dL (ref <=0.2)
Total Protein: 6.2 g/dL (ref 6.1–8.1)

## 2015-12-07 LAB — CBC WITH DIFFERENTIAL/PLATELET
BASOS PCT: 0 % (ref 0–1)
Basophils Absolute: 0 10*3/uL (ref 0.0–0.1)
Eosinophils Absolute: 0.3 10*3/uL (ref 0.0–0.7)
Eosinophils Relative: 4 % (ref 0–5)
HEMATOCRIT: 43.4 % (ref 36.0–46.0)
HEMOGLOBIN: 14.7 g/dL (ref 12.0–15.0)
LYMPHS PCT: 18 % (ref 12–46)
Lymphs Abs: 1.4 10*3/uL (ref 0.7–4.0)
MCH: 35.2 pg — ABNORMAL HIGH (ref 26.0–34.0)
MCHC: 33.9 g/dL (ref 30.0–36.0)
MCV: 103.8 fL — ABNORMAL HIGH (ref 78.0–100.0)
MONOS PCT: 9 % (ref 3–12)
MPV: 10.3 fL (ref 8.6–12.4)
Monocytes Absolute: 0.7 10*3/uL (ref 0.1–1.0)
NEUTROS ABS: 5.3 10*3/uL (ref 1.7–7.7)
NEUTROS PCT: 69 % (ref 43–77)
Platelets: 189 10*3/uL (ref 150–400)
RBC: 4.18 MIL/uL (ref 3.87–5.11)
RDW: 14.1 % (ref 11.5–15.5)
WBC: 7.7 10*3/uL (ref 4.0–10.5)

## 2015-12-07 LAB — BASIC METABOLIC PANEL
BUN: 11 mg/dL (ref 7–25)
CALCIUM: 9 mg/dL (ref 8.6–10.4)
CO2: 24 mmol/L (ref 20–31)
Chloride: 100 mmol/L (ref 98–110)
Creat: 0.66 mg/dL (ref 0.60–0.93)
Glucose, Bld: 121 mg/dL — ABNORMAL HIGH (ref 65–99)
POTASSIUM: 4.4 mmol/L (ref 3.5–5.3)
SODIUM: 136 mmol/L (ref 135–146)

## 2015-12-07 LAB — TSH: TSH: 1.684 u[IU]/mL (ref 0.350–4.500)

## 2015-12-07 MED ORDER — FUROSEMIDE 40 MG PO TABS: 40.0000 mg | ORAL_TABLET | Freq: Every day | ORAL | Status: DC

## 2015-12-07 NOTE — Patient Instructions (Addendum)
Medication Instructions:  No changes.  See your medication list. If you have a day where your legs are more swollen, you can take an extra 1/2 tablet of Lasix that day only.  Labwork: Today - BMET, CBC, TSH, LFTs  Testing/Procedures: None   Follow-Up: Dr. Fransico Him or Richardson Dopp, PA-C in 01/30/16 @ 3:20     Any Other Special Instructions Will Be Listed Below (If Applicable).  Keep your legs elevated.   Try to see if your caretaker can help you get compression stockings on. It is ok to drink Ensure or Boost to help with protein.  You can drink 1-3 cans a day. If you have any further chest pain or it gets worse, call us.  You can go to Go to [email or web address] to see if this device would help you keep your legs elevated.

## 2015-12-09 ENCOUNTER — Other Ambulatory Visit: Payer: Self-pay | Admitting: Cardiology

## 2016-01-05 ENCOUNTER — Other Ambulatory Visit: Payer: Self-pay | Admitting: Gastroenterology

## 2016-01-05 DIAGNOSIS — K3184 Gastroparesis: Secondary | ICD-10-CM | POA: Diagnosis not present

## 2016-01-05 DIAGNOSIS — R1310 Dysphagia, unspecified: Secondary | ICD-10-CM

## 2016-01-06 ENCOUNTER — Encounter (HOSPITAL_COMMUNITY): Payer: Self-pay | Admitting: *Deleted

## 2016-01-06 ENCOUNTER — Ambulatory Visit
Admission: RE | Admit: 2016-01-06 | Discharge: 2016-01-06 | Disposition: A | Discharge status: Home / Self Care | Payer: Medicare Other | Source: Ambulatory Visit | Attending: Gastroenterology | Admitting: Gastroenterology

## 2016-01-06 ENCOUNTER — Other Ambulatory Visit: Payer: Self-pay | Admitting: Gastroenterology

## 2016-01-06 ENCOUNTER — Inpatient Hospital Stay (HOSPITAL_COMMUNITY)
Admission: EM | Admit: 2016-01-06 | Discharge: 2016-01-16 | DRG: 380 | Disposition: A | Discharge status: Home Health | Payer: Medicare Other | Attending: Internal Medicine | Admitting: Internal Medicine

## 2016-01-06 DIAGNOSIS — M810 Age-related osteoporosis without current pathological fracture: Secondary | ICD-10-CM | POA: Diagnosis present

## 2016-01-06 DIAGNOSIS — M069 Rheumatoid arthritis, unspecified: Secondary | ICD-10-CM | POA: Diagnosis present

## 2016-01-06 DIAGNOSIS — E43 Unspecified severe protein-calorie malnutrition: Secondary | ICD-10-CM | POA: Diagnosis not present

## 2016-01-06 DIAGNOSIS — I471 Supraventricular tachycardia, unspecified: Secondary | ICD-10-CM

## 2016-01-06 DIAGNOSIS — R109 Unspecified abdominal pain: Secondary | ICD-10-CM | POA: Diagnosis not present

## 2016-01-06 DIAGNOSIS — K3184 Gastroparesis: Secondary | ICD-10-CM | POA: Diagnosis not present

## 2016-01-06 DIAGNOSIS — M4316 Spondylolisthesis, lumbar region: Secondary | ICD-10-CM | POA: Diagnosis present

## 2016-01-06 DIAGNOSIS — I4892 Unspecified atrial flutter: Secondary | ICD-10-CM

## 2016-01-06 DIAGNOSIS — I350 Nonrheumatic aortic (valve) stenosis: Secondary | ICD-10-CM | POA: Diagnosis present

## 2016-01-06 DIAGNOSIS — I2510 Atherosclerotic heart disease of native coronary artery without angina pectoris: Secondary | ICD-10-CM | POA: Diagnosis present

## 2016-01-06 DIAGNOSIS — I10 Essential (primary) hypertension: Secondary | ICD-10-CM | POA: Diagnosis not present

## 2016-01-06 DIAGNOSIS — R197 Diarrhea, unspecified: Secondary | ICD-10-CM | POA: Diagnosis present

## 2016-01-06 DIAGNOSIS — I4891 Unspecified atrial fibrillation: Secondary | ICD-10-CM

## 2016-01-06 DIAGNOSIS — K2980 Duodenitis without bleeding: Secondary | ICD-10-CM

## 2016-01-06 DIAGNOSIS — R1310 Dysphagia, unspecified: Secondary | ICD-10-CM

## 2016-01-06 DIAGNOSIS — K311 Adult hypertrophic pyloric stenosis: Principal | ICD-10-CM | POA: Diagnosis present

## 2016-01-06 DIAGNOSIS — I480 Paroxysmal atrial fibrillation: Secondary | ICD-10-CM | POA: Diagnosis not present

## 2016-01-06 DIAGNOSIS — K219 Gastro-esophageal reflux disease without esophagitis: Secondary | ICD-10-CM | POA: Diagnosis present

## 2016-01-06 DIAGNOSIS — E785 Hyperlipidemia, unspecified: Secondary | ICD-10-CM | POA: Diagnosis not present

## 2016-01-06 DIAGNOSIS — Z7952 Long term (current) use of systemic steroids: Secondary | ICD-10-CM

## 2016-01-06 DIAGNOSIS — R32 Unspecified urinary incontinence: Secondary | ICD-10-CM | POA: Diagnosis present

## 2016-01-06 DIAGNOSIS — Z955 Presence of coronary angioplasty implant and graft: Secondary | ICD-10-CM

## 2016-01-06 DIAGNOSIS — K2900 Acute gastritis without bleeding: Secondary | ICD-10-CM | POA: Diagnosis not present

## 2016-01-06 DIAGNOSIS — H3530 Unspecified macular degeneration: Secondary | ICD-10-CM | POA: Diagnosis present

## 2016-01-06 DIAGNOSIS — Z8711 Personal history of peptic ulcer disease: Secondary | ICD-10-CM

## 2016-01-06 DIAGNOSIS — Z6821 Body mass index (BMI) 21.0-21.9, adult: Secondary | ICD-10-CM

## 2016-01-06 DIAGNOSIS — Z9049 Acquired absence of other specified parts of digestive tract: Secondary | ICD-10-CM

## 2016-01-06 DIAGNOSIS — R112 Nausea with vomiting, unspecified: Secondary | ICD-10-CM | POA: Diagnosis not present

## 2016-01-06 HISTORY — DX: Adult hypertrophic pyloric stenosis: K31.1

## 2016-01-06 HISTORY — DX: Gastroparesis: K31.84

## 2016-01-06 LAB — COMPREHENSIVE METABOLIC PANEL
ALK PHOS: 53 U/L (ref 38–126)
ALT: 30 U/L (ref 14–54)
AST: 29 U/L (ref 15–41)
Albumin: 4.2 g/dL (ref 3.5–5.0)
Anion gap: 14 (ref 5–15)
BUN: 16 mg/dL (ref 6–20)
CALCIUM: 9.5 mg/dL (ref 8.9–10.3)
CO2: 25 mmol/L (ref 22–32)
CREATININE: 0.72 mg/dL (ref 0.44–1.00)
Chloride: 100 mmol/L — ABNORMAL LOW (ref 101–111)
GFR calc non Af Amer: >60 mL/min (ref >60)
GLUCOSE: 101 mg/dL — AB (ref 65–99)
Potassium: 4.4 mmol/L (ref 3.5–5.1)
SODIUM: 139 mmol/L (ref 135–145)
Total Bilirubin: 1.1 mg/dL (ref 0.3–1.2)
Total Protein: 6.5 g/dL (ref 6.5–8.1)

## 2016-01-06 LAB — CBC
HCT: 41.5 % (ref 36.0–46.0)
Hemoglobin: 14 g/dL (ref 12.0–15.0)
MCH: 35.6 pg — AB (ref 26.0–34.0)
MCHC: 33.7 g/dL (ref 30.0–36.0)
MCV: 105.6 fL — ABNORMAL HIGH (ref 78.0–100.0)
PLATELETS: 186 10*3/uL (ref 150–400)
RBC: 3.93 MIL/uL (ref 3.87–5.11)
RDW: 14.2 % (ref 11.5–15.5)
WBC: 6 10*3/uL (ref 4.0–10.5)

## 2016-01-06 LAB — URINALYSIS, ROUTINE W REFLEX MICROSCOPIC
BILIRUBIN URINE: NEGATIVE
GLUCOSE, UA: NEGATIVE mg/dL
HGB URINE DIPSTICK: NEGATIVE
KETONES UR: 40 mg/dL — AB
Nitrite: NEGATIVE
PROTEIN: NEGATIVE mg/dL
Specific Gravity, Urine: 1.013 (ref 1.005–1.030)
pH: 8 (ref 5.0–8.0)

## 2016-01-06 LAB — LIPASE, BLOOD: Lipase: 36 U/L (ref 11–51)

## 2016-01-06 LAB — URINE MICROSCOPIC-ADD ON
Bacteria, UA: NONE SEEN
RBC / HPF: NONE SEEN RBC/hpf (ref 0–5)

## 2016-01-06 MED ORDER — LORAZEPAM 1 MG PO TABS
2.0000 mg | ORAL_TABLET | Freq: Every day | ORAL | Status: DC
  Administered 2016-01-06 – 2016-01-10 (×5): 2 mg via ORAL
  Filled 2016-01-06 (×5): qty 2

## 2016-01-06 MED ORDER — ACEBUTOLOL HCL 200 MG PO CAPS
200.0000 mg | ORAL_CAPSULE | Freq: Two times a day (BID) | ORAL | Status: DC
  Administered 2016-01-06 – 2016-01-07 (×2): 200 mg via ORAL
  Filled 2016-01-06 (×4): qty 1

## 2016-01-06 MED ORDER — PREDNISONE 5 MG PO TABS
5.0000 mg | ORAL_TABLET | Freq: Every day | ORAL | Status: DC
  Administered 2016-01-06 – 2016-01-07 (×2): 5 mg via ORAL
  Filled 2016-01-06 (×3): qty 1

## 2016-01-06 MED ORDER — NYSTATIN 100000 UNIT/GM EX CREA
TOPICAL_CREAM | Freq: Two times a day (BID) | CUTANEOUS | Status: DC
  Administered 2016-01-06 – 2016-01-09 (×6): via TOPICAL
  Administered 2016-01-09: 1 via TOPICAL
  Administered 2016-01-10 – 2016-01-16 (×13): via TOPICAL
  Filled 2016-01-06 (×2): qty 15

## 2016-01-06 MED ORDER — HEPARIN SODIUM (PORCINE) 5000 UNIT/ML IJ SOLN
5000.0000 [IU] | Freq: Three times a day (TID) | INTRAMUSCULAR | Status: DC
  Administered 2016-01-06 – 2016-01-14 (×22): 5000 [IU] via SUBCUTANEOUS
  Filled 2016-01-06 (×26): qty 1

## 2016-01-06 MED ORDER — FLUTICASONE PROPIONATE 50 MCG/ACT NA SUSP
1.0000 | Freq: Every day | NASAL | Status: DC
  Administered 2016-01-06 – 2016-01-16 (×11): 1 via NASAL
  Filled 2016-01-06 (×2): qty 16

## 2016-01-06 MED ORDER — SODIUM CHLORIDE 0.9 % IV BOLUS (SEPSIS)
500.0000 mL | Freq: Once | INTRAVENOUS | Status: AC
  Administered 2016-01-06: 500 mL via INTRAVENOUS

## 2016-01-06 MED ORDER — ALBUTEROL SULFATE (2.5 MG/3ML) 0.083% IN NEBU: 2.5000 mg | INHALATION_SOLUTION | RESPIRATORY_TRACT | Status: DC | PRN

## 2016-01-06 MED ORDER — SODIUM CHLORIDE 0.9 % IV SOLN
INTRAVENOUS | Status: DC
  Administered 2016-01-06: 21:00:00 via INTRAVENOUS

## 2016-01-06 MED ORDER — PANTOPRAZOLE SODIUM 40 MG PO TBEC
40.0000 mg | DELAYED_RELEASE_TABLET | Freq: Two times a day (BID) | ORAL | Status: DC
  Administered 2016-01-06 – 2016-01-07 (×2): 40 mg via ORAL
  Filled 2016-01-06 (×3): qty 1

## 2016-01-06 NOTE — ED Notes (Signed)
Bed: WA01 Expected date:  Expected time:  Means of arrival:  Comments: Triage 1 

## 2016-01-06 NOTE — Consult Note (Signed)
Referring Provider: Dr. Waldron Labs Primary Care Physician:  Irven Shelling, MD Primary Gastroenterologist:  Dr. Wynetta Emery  Reason for Consultation:  Gastric Outlet Obstruction  HPI: Angela [name] is a 80 y.o. female with recurrent abdominal pain each day that initially started occurring in June 2016. Reports having minimal PO intake for the past few months due to diffuse abdominal pain and a "hardening" of her abdomen during the middle part of the day. Severe nausea but denies having any vomiting or regurgitation each day. +Heartburn. Abdominal pain would worsen during the afternoon and evening. Eating very little food over the past few months. Has lost 20 pounds unintentionally in the past 3 months. Denies melena or hematochezia. Recently has had loose stools. Incomplete EGD in December 2016 by Dr. Wynetta Emery where the distal stomach was not visualized due to a large amount of retained food. UGIS today showed gastric outlet obstruction. She was thought to have gastroparesis based on her incomplete EGD. EGD in 2011 (Dr. Wynetta Emery) showed retained food in the fundus and a normal antrum, pylorus and duodenal bulb. On Plavix for Afib. On Prednisone for RA. Denies NSAIDs. Daughter at bedside who helps with the history.   Past Medical History  Diagnosis Date  . Cervical stenosis of spine   . Spondylolisthesis of lumbar region   . A-fib ([place])   . Asthma   . Depression   . Dyslipidemia   . GERD (gastroesophageal reflux disease)   . Obesity   . PVC's (premature ventricular contractions)   . PUD (peptic ulcer disease)     can not take aspirin  . Osteoporosis     alendronate since 2012  . Macular degeneration   . Aortic stenosis     mild by echo 03/2012  . Hypertension   . CAD (coronary artery disease)     s/p PCI with BMS to mid LAD--2/08 not on aspirin due to frequent ulcers.  Repeat cath for CP 09/2012 with patent LAD stent and otherwise normal coronary arteries  . Elbow fracture, right    history of- never any surgery  . Rheumatoid arthritis(714.0)     Dr. Lenna Gilford follows  . Impaired physical mobility     due to rheumatoid arthritis "ambulates with walker" limited free standing.    Past Surgical History  Procedure Laterality Date  . Fracture surgery  09/2011    left ankle screw and pin removed  . Cardiac catheterization  09/2012    patent LAD stent and otherwise normal coronary arteries  . Cervical fusion      limited  ROM  . Tonsillectomy    . Cataract extraction, bilateral Bilateral   . Joint replacement Bilateral     BTKA  . Hand surgery Bilateral     x4 digits 2-5 both hands  . Ankle fusion Bilateral     x2 both  . Cholecystectomy    . Esophagogastroduodenoscopy (egd) with propofol N/A 11/01/2015    Procedure: ESOPHAGOGASTRODUODENOSCOPY (EGD) WITH PROPOFOL w/ Dialtation;  Surgeon: Garlan Fair, MD;  Location: [facility];  Service: Endoscopy;  Laterality: N/A;    Prior to Admission medications   Medication Sig Start Date End Date Taking? Authorizing Provider  acebutolol (SECTRAL) 200 MG capsule TAKE 1 CAPSULE (200 MG TOTAL) BY MOUTH 2 (TWO) TIMES DAILY. 11/24/15  Yes Sueanne Margarita, MD  Calcium Carbonate-Vitamin D (CALCIUM 500+D PO) Take 1 tablet by mouth 3 (three) times daily.   Yes Historical Provider, MD  clopidogrel (PLAVIX) 75 MG tablet TAKE 1 TABLET (75  MG TOTAL) BY MOUTH EVERY EVENING. 10/24/15  Yes Sueanne Margarita, MD  diclofenac sodium (VOLTAREN) 1 % GEL Apply 2 g topically 2 (two) times daily as needed. For pain Patient taking differently: Apply 2 g topically 2 (two) times daily. For pain 10/19/15  Yes Meredith Staggers, MD  Docusate Calcium (STOOL SOFTENER PO) Take 1 tablet by mouth at bedtime.   Yes Historical Provider, MD  folic acid (FOLVITE) 1 MG tablet Take 1 mg by mouth daily.  12/03/13  Yes Historical Provider, MD  furosemide (LASIX) 40 MG tablet Take 1 tablet (40 mg total) by mouth daily. You may take extra 1/2 tablet on days when you have  extra swelling Patient taking differently: Take 40 mg by mouth daily.  12/07/15  Yes Scott T Weaver, PA-C  lidocaine (LIDODERM) 5 % APPLY 1 PATCH ONTO THE SKIN DAILY REMOVE AND DISCARD PATCH WITHIN 12 HOURS OR AS DIRECTED BY PRESCRIBER Patient taking differently: Place 1 patch onto the skin daily. APPLY NECK AND ELBOW 10/19/15  Yes Meredith Staggers, MD  LORazepam (ATIVAN) 2 MG tablet Take 2 mg by mouth at bedtime. 02/05/15  Yes Historical Provider, MD  mometasone (NASONEX) 50 MCG/ACT nasal spray Place 2 sprays into the nose daily as needed (nasal congestion).   Yes Historical Provider, MD  nystatin (MYCOSTATIN) 100000 UNIT/ML suspension Take 5 mLs by mouth 4 (four) times daily.   Yes Historical Provider, MD  nystatin cream (MYCOSTATIN) Apply 1 application topically daily as needed for dry skin.   Yes Historical Provider, MD  oxyCODONE-acetaminophen (PERCOCET) 7.5-325 MG tablet Take 1 tablet by mouth every 6 (six) hours as needed. Patient taking differently: Take 1 tablet by mouth every 6 (six) hours as needed for moderate pain.  10/19/15  Yes Meredith Staggers, MD  pantoprazole (PROTONIX) 40 MG tablet Take 40 mg by mouth 2 (two) times daily.    Yes Historical Provider, MD  predniSONE (DELTASONE) 5 MG tablet Take 5 mg by mouth daily.   Yes Historical Provider, MD  PREVIDENT 0.2 % SOLN Take 1 each by mouth at bedtime. Rinses with about 1 capful nightly 09/05/12  Yes Historical Provider, MD  simvastatin (ZOCOR) 40 MG tablet TAKE 1 TABLET BY MOUTH AT BEDTIME 12/09/15  Yes Sueanne Margarita, MD  TOVIAZ 8 MG TB24 tablet Take 8 mg by mouth daily. 10/25/15  Yes Historical Provider, MD  butalbital-acetaminophen-caffeine (FIORICET, ESGIC) 50-325-40 MG per tablet Take 1 tablet by mouth every 6 (six) hours as needed for headache or migraine. For migraines 06/23/12   Historical Provider, MD  EPIPEN 2-PAK 0.3 MG/0.3ML SOAJ Inject 0.3 mg into the muscle daily as needed (for allergic reaction).  03/12/13   Historical Provider, MD   fluticasone (FLONASE) 50 MCG/ACT nasal spray Place 1 spray into both nostrils daily as needed for allergies.  12/01/14   Historical Provider, MD  methotrexate (RHEUMATREX) 2.5 MG tablet Take 15 mg by mouth once a week. On Saturdays 03/01/12   Historical Provider, MD  NITROSTAT 0.4 MG SL tablet Place 0.4 mg under the tongue every 5 (five) minutes as needed for chest pain.  09/16/12   Historical Provider, MD    Scheduled Meds: Continuous Infusions: PRN Meds:.  Allergies as of 01/06/2016 - Review Complete 01/06/2016  Allergen Reaction Noted  . Peanut-containing drug products Anaphylaxis 01/29/2012  . Reglan [metoclopramide] Other (See Comments) 06/04/2012  . Codeine Nausea Only 03/28/2012  . Iohexol Swelling 05/11/2005  . Pineapple Swelling 12/08/2014  . Shellfish allergy Swelling  12/04/2011    Family History  Problem Relation Age of Onset  . Lung cancer Mother   . Melanoma Brother   . Arrhythmia Mother   . Heart attack Neg Hx     Social History   Social History  . Marital Status: Widowed    Spouse Name: N/A  . Number of Children: N/A  . Years of Education: N/A   Occupational History  . Not on file.   Social History Main Topics  . Smoking status: Never Smoker   . Smokeless tobacco: Never Used  . Alcohol Use: No  . Drug Use: No  . Sexual Activity: Not on file   Other Topics Concern  . Not on file   Social History Narrative    Review of Systems: All negative except as stated above in HPI.  Physical Exam: Vital signs: Filed Vitals:   01/06/16 1610 01/06/16 1844  BP: 110/70 127/75  Pulse: 72 71  Temp: 97.8 F (36.6 C) 97.8 F (36.6 C)  Resp: 16 16     General:   Alert,  Elderly, thin, pleasant and cooperative in NAD Head: atraumatic Eyes: anicteric sclera ENT: oropharynx clear Neck: supple, nontender Lungs:  Clear throughout to auscultation.   No wheezes, crackles, or rhonchi. No acute distress. Heart:  Regular rate and rhythm; no murmurs, clicks, rubs,   or gallops. Abdomen: mild distention, nontender, +BS  Rectal:  Deferred Ext: no edema Skin: no rash  GI:  Lab Results:  Recent Labs  01/06/16 1356  WBC 6.0  HGB 14.0  HCT 41.5  PLT 186   BMET  Recent Labs  01/06/16 1356  NA 139  K 4.4  CL 100*  CO2 25  GLUCOSE 101*  BUN 16  CREATININE 0.72  CALCIUM 9.5   LFT  Recent Labs  01/06/16 1356  PROT 6.5  ALBUMIN 4.2  AST 29  ALT 30  ALKPHOS 53  BILITOT 1.1   PT/INR No results for input(s): LABPROT, INR in the last 72 hours.   Studies/Results: Dg Ugi  W/kub  01/06/2016  CLINICAL DATA:  Dysphagia EXAM: UPPER GI SERIES WITH KUB TECHNIQUE: After obtaining a scout radiograph a routine upper GI series was performed using thin and thick barium FLUOROSCOPY TIME:  Radiation Exposure Index (as provided by the fluoroscopic device): 111 d Gy cm2 If the device does not provide the exposure index: Fluoroscopy Time (in minutes and seconds): Number of Acquired Images: COMPARISON:  Barium swallow 07/13/2009 FINDINGS: Fluoroscopic evaluation of swallowing demonstrates no fixed stricture within the esophagus. There is mild generalized tapering of the distal third of the esophagus. No fold thickening. There is an elongated stomach. No gastric emptying can be visualized during the study. The patient was left on her right side to for 15-20 minutes. Despite this, the stomach never emptied. There is retained food material throughout the stomach. IMPRESSION: No emptying of the stomach despite waiting up to 20 minutes. Findings compatible with gastric outlet obstruction. Retained food material within the stomach. Electronically Signed   By: Rolm Baptise M.D.   On: 01/06/2016 11:39    Impression/Plan: 80 yo with gastric outlet obstruction and weight loss concerning for a malignancy vs. peptic ulcer disease. NG tube needed to suction gastric contents. EGD tomorrow morning to further evaluate for a source of the GOO. NPO. Supportive care.    LOS:  0 days   [place]  01/06/2016, 6:48 PM  Pager [phone]  If no answer or after 5 PM call [phone]

## 2016-01-06 NOTE — ED Notes (Addendum)
Pt sent form MD office, unable to pass barium this morning, ? Obstruction, weight loss and loss of appetite over last few days. Last food intake 1600 yesterday

## 2016-01-06 NOTE — Progress Notes (Signed)
Discussed review of chart information with ED PA/NP, Rob

## 2016-01-06 NOTE — H&P (Addendum)
Patient Demographics  Angela [name], is a 80 y.o. female  MRN: [id number]   DOB - 03/04/1936  Admit Date - 01/06/2016  Outpatient Primary MD for the patient is Irven Shelling, MD   With History of -  Past Medical History  Diagnosis Date  . Cervical stenosis of spine   . Spondylolisthesis of lumbar region   . A-fib ([place])   . Asthma   . Depression   . Dyslipidemia   . GERD (gastroesophageal reflux disease)   . Obesity   . PVC's (premature ventricular contractions)   . PUD (peptic ulcer disease)     can not take aspirin  . Osteoporosis     alendronate since 2012  . Macular degeneration   . Aortic stenosis     mild by echo 03/2012  . Hypertension   . CAD (coronary artery disease)     s/p PCI with BMS to mid LAD--2/08 not on aspirin due to frequent ulcers.  Repeat cath for CP 09/2012 with patent LAD stent and otherwise normal coronary arteries  . Elbow fracture, right     history of- never any surgery  . Rheumatoid arthritis(714.0)     Dr. Lenna Gilford follows  . Impaired physical mobility     due to rheumatoid arthritis "ambulates with walker" limited free standing.      Past Surgical History  Procedure Laterality Date  . Fracture surgery  09/2011    left ankle screw and pin removed  . Cardiac catheterization  09/2012    patent LAD stent and otherwise normal coronary arteries  . Cervical fusion      limited  ROM  . Tonsillectomy    . Cataract extraction, bilateral Bilateral   . Joint replacement Bilateral     BTKA  . Hand surgery Bilateral     x4 digits 2-5 both hands  . Ankle fusion Bilateral     x2 both  . Cholecystectomy    . Esophagogastroduodenoscopy (egd) with propofol N/A 11/01/2015    Procedure: ESOPHAGOGASTRODUODENOSCOPY (EGD) WITH PROPOFOL w/ Dialtation;  Surgeon: Garlan Fair, MD;  Location: [facility];  Service: Endoscopy;  Laterality: N/A;    in for   Chief Complaint  Patient presents with  . Abdominal Pain     HPI  Angela [name]   is a 80 y.o. female, with past medical history of coronary artery disease status post PCI, hypertension, hyperlipidemia, rheumatoid arthritis, peptic ulcer disease, aortic stenosis, patient was sent by Dr. Norwood Levo [facility] for abnormal upper GI series, she reports she saw Dr. Wynetta Emery yesterday, ordered upper GI series for her, results were abnormal, where she had contrast in stomach, did not pass through, soshe was instructed to come to ED, she denies any abdominal pain, fever, chills, or vomiting, but  she has complaints of nausea for few month, weight loss, and diarrhea over last few days as she's been receiving oral antibiotic for tooth infection, patient had EGD in December 20suspicious for severe gastroparesis(EGD incomplete as pylorus could not be located), ED workup with no evidence of significant labs abnormalities, I was called to admit.    Review of Systems    In addition to the HPI above, No Fever-chills, No Headache, No changes with Vision or hearing, No problems swallowing food or Liquids, No Chest pain, Cough or Shortness of Breath, No Abdominal painreports nausea Nausbut no vomiting, watery diarrhea over last week  Blood in stool or Urine, No dysuria, No new skin rashes or bruises, No new  joints pains-aches,  No new weakness, tingling, numbness in any extreports weight loss over few month giving nausea and poor appetite No polyuria, polydypsia or polyphagia, No significant Mental Stressors.  A full 10 point Review of Systems was done, except as stated above, all other Review of Systems were negative.   Social History Social History  Substance Use Topics  . Smoking status: Never Smoker   . Smokeless tobacco: Never Used  . Alcohol Use: No     Family History Family History  Problem Relation Age of Onset  . Lung cancer Mother   . Melanoma Brother   . Arrhythmia Mother   . Heart attack Neg Hx     Prior to Admission medications   Medication Sig Start Date End Date  Taking? Authorizing Provider  acebutolol (SECTRAL) 200 MG capsule TAKE 1 CAPSULE (200 MG TOTAL) BY MOUTH 2 (TWO) TIMES DAILY. 11/24/15  Yes Sueanne Margarita, MD  Calcium Carbonate-Vitamin D (CALCIUM 500+D PO) Take 1 tablet by mouth 3 (three) times daily.   Yes Historical Provider, MD  clopidogrel (PLAVIX) 75 MG tablet TAKE 1 TABLET (75 MG TOTAL) BY MOUTH EVERY EVENING. 10/24/15  Yes Sueanne Margarita, MD  diclofenac sodium (VOLTAREN) 1 % GEL Apply 2 g topically 2 (two) times daily as needed. For pain Patient taking differently: Apply 2 g topically 2 (two) times daily. For pain 10/19/15  Yes Meredith Staggers, MD  Docusate Calcium (STOOL SOFTENER PO) Take 1 tablet by mouth at bedtime.   Yes Historical Provider, MD  folic acid (FOLVITE) 1 MG tablet Take 1 mg by mouth daily.  12/03/13  Yes Historical Provider, MD  furosemide (LASIX) 40 MG tablet Take 1 tablet (40 mg total) by mouth daily. You may take extra 1/2 tablet on days when you have extra swelling Patient taking differently: Take 40 mg by mouth daily.  12/07/15  Yes Scott T Weaver, PA-C  lidocaine (LIDODERM) 5 % APPLY 1 PATCH ONTO THE SKIN DAILY REMOVE AND DISCARD PATCH WITHIN 12 HOURS OR AS DIRECTED BY PRESCRIBER Patient taking differently: Place 1 patch onto the skin daily. APPLY NECK AND ELBOW 10/19/15  Yes Meredith Staggers, MD  LORazepam (ATIVAN) 2 MG tablet Take 2 mg by mouth at bedtime. 02/05/15  Yes Historical Provider, MD  mometasone (NASONEX) 50 MCG/ACT nasal spray Place 2 sprays into the nose daily as needed (nasal congestion).   Yes Historical Provider, MD  nystatin (MYCOSTATIN) 100000 UNIT/ML suspension Take 5 mLs by mouth 4 (four) times daily.   Yes Historical Provider, MD  nystatin cream (MYCOSTATIN) Apply 1 application topically daily as needed for dry skin.   Yes Historical Provider, MD  oxyCODONE-acetaminophen (PERCOCET) 7.5-325 MG tablet Take 1 tablet by mouth every 6 (six) hours as needed. Patient taking differently: Take 1 tablet by  mouth every 6 (six) hours as needed for moderate pain.  10/19/15  Yes Meredith Staggers, MD  pantoprazole (PROTONIX) 40 MG tablet Take 40 mg by mouth 2 (two) times daily.    Yes Historical Provider, MD  predniSONE (DELTASONE) 5 MG tablet Take 5 mg by mouth daily.   Yes Historical Provider, MD  PREVIDENT 0.2 % SOLN Take 1 each by mouth at bedtime. Rinses with about 1 capful nightly 09/05/12  Yes Historical Provider, MD  simvastatin (ZOCOR) 40 MG tablet TAKE 1 TABLET BY MOUTH AT BEDTIME 12/09/15  Yes Sueanne Margarita, MD  TOVIAZ 8 MG TB24 tablet Take 8 mg by mouth daily. 10/25/15  Yes Historical  Provider, MD  butalbital-acetaminophen-caffeine (FIORICET, ESGIC) 50-325-40 MG per tablet Take 1 tablet by mouth every 6 (six) hours as needed for headache or migraine. For migraines 06/23/12   Historical Provider, MD  EPIPEN 2-PAK 0.3 MG/0.3ML SOAJ Inject 0.3 mg into the muscle daily as needed (for allergic reaction).  03/12/13   Historical Provider, MD  fluticasone (FLONASE) 50 MCG/ACT nasal spray Place 1 spray into both nostrils daily as needed for allergies.  12/01/14   Historical Provider, MD  methotrexate (RHEUMATREX) 2.5 MG tablet Take 15 mg by mouth once a week. On Saturdays 03/01/12   Historical Provider, MD  NITROSTAT 0.4 MG SL tablet Place 0.4 mg under the tongue every 5 (five) minutes as needed for chest pain.  09/16/12   Historical Provider, MD    Allergies  Allergen Reactions  . Peanut-Containing Drug Products Anaphylaxis  . Reglan [Metoclopramide] Other (See Comments)    "Messed my mind up"  . Codeine Nausea Only  . Iohexol Swelling     Desc: pt received hypaque 60% in 1994 and had erythema, hives & lips swell.  She did ok w/o premeds in 8/05 at [facility].  Pt. takes daily prednisone for rheumatoid arthritis.  Poor venous access today (7/7/6) so we did her w/o iv contrast.   . Pineapple Swelling  . Shellfish Allergy Swelling    Lips swell    Physical Exam  Vitals  Blood pressure 110/70, pulse 72,  temperature 97.8 F (36.6 C), temperature source Oral, resp. rate 16, SpO2 95 %.   1. General  lucent elderly female  lying in bed in NAD,    2. Normal affect and insight, Not Suicidal or Homicidal, Awake Alert, Oriented X 3.  3. No F.N deficits, ALL C.Nerves Intact, Strength 5/5 all 4 extremities, Sensation intact all 4 extremities, Plantars down going.  4. Ears and Eyes appear Normal, Conjunctivae clear, PERRLA. Moist Oral Mucosa.  5. Supple Neck, No JVD, No cervical lymphadenopathy appriciated, No Carotid Bruits.  6. Symmetrical Chest wall movement, Good air movement bilaterally, CTAB.  7. RRR, No Gallops, Rubs or Murmurs, No Parasternal Heave.  8. Positive Bowel Sounds, Abdomen Soft, No tenderness, No organomegaly appriciated,No rebound -guarding or rigidity.  9.  No Cyanosis, Normal Skin Turgor, No Skin Rash or Bruise.  10. Good muscle tone, , no effusions, finger joints significant for rheumatoid arthritis    Data Review  CBC  Recent Labs Lab 01/06/16 1356  WBC 6.0  HGB 14.0  HCT 41.5  PLT 186  MCV 105.6*  MCH 35.6*  MCHC 33.7  RDW 14.2   ------------------------------------------------------------------------------------------------------------------  Chemistries   Recent Labs Lab 01/06/16 1356  NA 139  K 4.4  CL 100*  CO2 25  GLUCOSE 101*  BUN 16  CREATININE 0.72  CALCIUM 9.5  AST 29  ALT 30  ALKPHOS 53  BILITOT 1.1   ------------------------------------------------------------------------------------------------------------------ CrCl cannot be calculated (Unknown ideal weight.). ------------------------------------------------------------------------------------------------------------------ No results for input(s): TSH, T4TOTAL, T3FREE, THYROIDAB in the last 72 hours.  Invalid input(s): FREET3   Coagulation profile No results for input(s): INR, PROTIME in the last 168  hours. ------------------------------------------------------------------------------------------------------------------- No results for input(s): DDIMER in the last 72 hours. -------------------------------------------------------------------------------------------------------------------  Cardiac Enzymes No results for input(s): CKMB, TROPONINI, MYOGLOBIN in the last 168 hours.  Invalid input(s): CK ------------------------------------------------------------------------------------------------------------------ Invalid input(s): POCBNP   ---------------------------------------------------------------------------------------------------------------  Urinalysis    Component Value Date/Time   COLORURINE YELLOW 01/06/2016 1556   APPEARANCEUR CLOUDY* 01/06/2016 1556   LABSPEC 1.013 01/06/2016 1556   PHURINE  8.0 01/06/2016 1556   GLUCOSEU NEGATIVE 01/06/2016 1556   HGBUR NEGATIVE 01/06/2016 1556   BILIRUBINUR NEGATIVE 01/06/2016 1556   KETONESUR 40* 01/06/2016 1556   PROTEINUR NEGATIVE 01/06/2016 1556   UROBILINOGEN 0.2 06/15/2013 2213   NITRITE NEGATIVE 01/06/2016 1556   LEUKOCYTESUR SMALL* 01/06/2016 1556    ----------------------------------------------------------------------------------------------------------------  Imaging results:   Dg Duanne Limerick  W/kub  01/06/2016  CLINICAL DATA:  Dysphagia EXAM: UPPER GI SERIES WITH KUB TECHNIQUE: After obtaining a scout radiograph a routine upper GI series was performed using thin and thick barium FLUOROSCOPY TIME:  Radiation Exposure Index (as provided by the fluoroscopic device): 111 d Gy cm2 If the device does not provide the exposure index: Fluoroscopy Time (in minutes and seconds): Number of Acquired Images: COMPARISON:  Barium swallow 07/13/2009 FINDINGS: Fluoroscopic evaluation of swallowing demonstrates no fixed stricture within the esophagus. There is mild generalized tapering of the distal third of the esophagus. No fold thickening.  There is an elongated stomach. No gastric emptying can be visualized during the study. The patient was left on her right side to for 15-20 minutes. Despite this, the stomach never emptied. There is retained food material throughout the stomach. IMPRESSION: No emptying of the stomach despite waiting up to 20 minutes. Findings compatible with gastric outlet obstruction. Retained food material within the stomach. Electronically Signed   By: Rolm Baptise M.D.   On: 01/06/2016 11:39       Assessment & Plan  Active Problems:   Rheumatoid arthritis (HCC)   Dyslipidemia   Essential hypertension, benign   Gastric outlet obstruction   Gastroparesis   nausea, weight loss, and abnormal upper GI series  - Patient presents with chronic nausea,and weight loss candidate to poor oral intake, findings suspicious for gastroparesis versus gastric outlet obstruction, pylorus could not be located during previous endoscopy, possible stricture from previous ulcer, discussed with Dr. Percell Miller, patient will be seen tonight by Dr. Michail Sermon, possible endoscopy tomorrow, I would think at one point patient will need gastric empty study, meanwhile will keep nothing by mouth, will insert NG tube with suctioning, deep on IV fluids and when necessary nausea medication.  Rheumatoid arthritis - Continue to hold methotrexate patient is more stable, continue with home dose prednisone, if she develops vomiting, will transition to IV, continue to hold methotrexate,   Coronary artery disease - Denies any chest pain or shortness of breath, continue with beta blockers, will continue to hold Plavix as unclear if she will need intervention.  History of PVC -Continue with Acebutolol  Hyperlipidemia - Resume statin when stable  Diarrhea - Patient reports watery diarrhea, just finished her by mouth antibiotics secondary to tooth infection, will check C. difficile  DVT Prophylaxis Heparin -SCDs  AM Labs Ordered, also please review  Full Orders  Family Communication: Admission, patients condition and plan of care including tests being ordered have been discussed with the patient and caregiver who indicate understanding and agree with the plan and Code Status.  Code Status Full  Likely DC to  home   Condition GUARDED   Time spent in minutes : 55 minutes     ELGERGAWY, DAWOOD M.D on 01/06/2016 at 5:43 PM  Between 7am to 7pm - Pager - [phone]  After 7pm go to [email or web address] - password [email or web address]  And look for the night coverage person covering me after hours  [facility] Office  [phone]

## 2016-01-06 NOTE — ED Provider Notes (Signed)
CSN: [id number]     Arrival date & time 01/06/16  1325 History   First MD Initiated Contact with Patient 01/06/16 1500     Chief Complaint  Patient presents with  . Abdominal Pain     (Consider location/radiation/quality/duration/timing/severity/associated sxs/prior Treatment) HPI Comments: Patient presents emergency department with chief complaint of moderate abdominal pain. She states that she has been having the symptoms for several months, and has been in the middle of several workups. Today, she was sent from MD office after having barium swallow.per the plain film, there is no emptying of the stomach despite waiting up to 20 minutes. Patient reports feeling nauseated, but not having any vomiting. She states that she has been having loose stools for several weeks, but attributes this to taking antibiotics for a dental infection. She denies any fevers or chills. There are no modifying factors.  The history is provided by the patient. No language interpreter was used.    Past Medical History  Diagnosis Date  . Cervical stenosis of spine   . Spondylolisthesis of lumbar region   . A-fib ([place])   . Asthma   . Depression   . Dyslipidemia   . GERD (gastroesophageal reflux disease)   . Obesity   . PVC's (premature ventricular contractions)   . PUD (peptic ulcer disease)     can not take aspirin  . Osteoporosis     alendronate since 2012  . Macular degeneration   . Aortic stenosis     mild by echo 03/2012  . Hypertension   . CAD (coronary artery disease)     s/p PCI with BMS to mid LAD--2/08 not on aspirin due to frequent ulcers.  Repeat cath for CP 09/2012 with patent LAD stent and otherwise normal coronary arteries  . Elbow fracture, right     history of- never any surgery  . Rheumatoid arthritis(714.0)     Dr. Lenna Gilford follows  . Impaired physical mobility     due to rheumatoid arthritis "ambulates with walker" limited free standing.   Past Surgical History  Procedure Laterality  Date  . Fracture surgery  09/2011    left ankle screw and pin removed  . Cardiac catheterization  09/2012    patent LAD stent and otherwise normal coronary arteries  . Cervical fusion      limited  ROM  . Tonsillectomy    . Cataract extraction, bilateral Bilateral   . Joint replacement Bilateral     BTKA  . Hand surgery Bilateral     x4 digits 2-5 both hands  . Ankle fusion Bilateral     x2 both  . Cholecystectomy    . Esophagogastroduodenoscopy (egd) with propofol N/A 11/01/2015    Procedure: ESOPHAGOGASTRODUODENOSCOPY (EGD) WITH PROPOFOL w/ Dialtation;  Surgeon: Garlan Fair, MD;  Location: [facility];  Service: Endoscopy;  Laterality: N/A;   Family History  Problem Relation Age of Onset  . Lung cancer Mother   . Melanoma Brother   . Arrhythmia Mother   . Heart attack Neg Hx    Social History  Substance Use Topics  . Smoking status: Never Smoker   . Smokeless tobacco: Never Used  . Alcohol Use: No   OB History    No data available     Review of Systems  All other systems reviewed and are negative.     Allergies  Peanut-containing drug products; Reglan; Codeine; Iohexol; Pineapple; and Shellfish allergy  Home Medications   Prior to Admission medications   Medication  Sig Start Date End Date Taking? Authorizing Provider  acebutolol (SECTRAL) 200 MG capsule TAKE 1 CAPSULE (200 MG TOTAL) BY MOUTH 2 (TWO) TIMES DAILY. 11/24/15  Yes Sueanne Margarita, MD  Calcium Carbonate-Vitamin D (CALCIUM 500+D PO) Take 1 tablet by mouth 3 (three) times daily.   Yes Historical Provider, MD  clopidogrel (PLAVIX) 75 MG tablet TAKE 1 TABLET (75 MG TOTAL) BY MOUTH EVERY EVENING. 10/24/15  Yes Sueanne Margarita, MD  diclofenac sodium (VOLTAREN) 1 % GEL Apply 2 g topically 2 (two) times daily as needed. For pain Patient taking differently: Apply 2 g topically 2 (two) times daily. For pain 10/19/15  Yes Meredith Staggers, MD  Docusate Calcium (STOOL SOFTENER PO) Take 1 tablet by mouth at  bedtime.   Yes Historical Provider, MD  folic acid (FOLVITE) 1 MG tablet Take 1 mg by mouth daily.  12/03/13  Yes Historical Provider, MD  furosemide (LASIX) 40 MG tablet Take 1 tablet (40 mg total) by mouth daily. You may take extra 1/2 tablet on days when you have extra swelling Patient taking differently: Take 40 mg by mouth daily.  12/07/15  Yes Scott T Weaver, PA-C  lidocaine (LIDODERM) 5 % APPLY 1 PATCH ONTO THE SKIN DAILY REMOVE AND DISCARD PATCH WITHIN 12 HOURS OR AS DIRECTED BY PRESCRIBER Patient taking differently: Place 1 patch onto the skin daily. APPLY NECK AND ELBOW 10/19/15  Yes Meredith Staggers, MD  LORazepam (ATIVAN) 2 MG tablet Take 2 mg by mouth at bedtime. 02/05/15  Yes Historical Provider, MD  mometasone (NASONEX) 50 MCG/ACT nasal spray Place 2 sprays into the nose daily as needed (nasal congestion).   Yes Historical Provider, MD  nystatin (MYCOSTATIN) 100000 UNIT/ML suspension Take 5 mLs by mouth 4 (four) times daily.   Yes Historical Provider, MD  nystatin cream (MYCOSTATIN) Apply 1 application topically daily as needed for dry skin.   Yes Historical Provider, MD  oxyCODONE-acetaminophen (PERCOCET) 7.5-325 MG tablet Take 1 tablet by mouth every 6 (six) hours as needed. Patient taking differently: Take 1 tablet by mouth every 6 (six) hours as needed for moderate pain.  10/19/15  Yes Meredith Staggers, MD  pantoprazole (PROTONIX) 40 MG tablet Take 40 mg by mouth 2 (two) times daily.    Yes Historical Provider, MD  predniSONE (DELTASONE) 5 MG tablet Take 5 mg by mouth daily.   Yes Historical Provider, MD  PREVIDENT 0.2 % SOLN Take 1 each by mouth at bedtime. Rinses with about 1 capful nightly 09/05/12  Yes Historical Provider, MD  simvastatin (ZOCOR) 40 MG tablet TAKE 1 TABLET BY MOUTH AT BEDTIME 12/09/15  Yes Sueanne Margarita, MD  TOVIAZ 8 MG TB24 tablet Take 8 mg by mouth daily. 10/25/15  Yes Historical Provider, MD  butalbital-acetaminophen-caffeine (FIORICET, ESGIC) 50-325-40 MG per  tablet Take 1 tablet by mouth every 6 (six) hours as needed for headache or migraine. For migraines 06/23/12   Historical Provider, MD  citalopram (CELEXA) 20 MG tablet TAKE 1 TABLET DAILY Patient not taking: Reported on 01/06/2016 09/17/13   Meredith Staggers, MD  EPIPEN 2-PAK 0.3 MG/0.3ML SOAJ Inject 0.3 mg into the muscle daily as needed (for allergic reaction).  03/12/13   Historical Provider, MD  fluticasone (FLONASE) 50 MCG/ACT nasal spray Place 1 spray into both nostrils daily as needed for allergies.  12/01/14   Historical Provider, MD  methotrexate (RHEUMATREX) 2.5 MG tablet Take 15 mg by mouth once a week. On Saturdays 03/01/12   Historical  Provider, MD  NITROSTAT 0.4 MG SL tablet Place 0.4 mg under the tongue every 5 (five) minutes as needed for chest pain.  09/16/12   Historical Provider, MD   BP 134/66 mmHg  Pulse 66  Temp(Src) 98.2 F (36.8 C) (Oral)  Resp 16  SpO2 94% Physical Exam  Constitutional: She is oriented to person, place, and time. She appears well-developed and well-nourished.  HENT:  Head: Normocephalic and atraumatic.  Eyes: Conjunctivae and EOM are normal. Pupils are equal, round, and reactive to light.  Neck: Normal range of motion. Neck supple.  Cardiovascular: Normal rate and regular rhythm.  Exam reveals no gallop and no friction rub.   No murmur heard. Pulmonary/Chest: Effort normal and breath sounds normal. No respiratory distress. She has no wheezes. She has no rales. She exhibits no tenderness.  Abdominal: Soft. Bowel sounds are normal. She exhibits no distension and no mass. There is no tenderness. There is no rebound and no guarding.  No focal abdominal tenderness, no RLQ tenderness or pain at McBurney's point, no RUQ tenderness or Murphy's sign, no left-sided abdominal tenderness, no fluid wave, or signs of peritonitis   Musculoskeletal: Normal range of motion. She exhibits no edema or tenderness.  Neurological: She is alert and oriented to person, place,  and time.  Skin: Skin is warm and dry.  Psychiatric: She has a normal mood and affect. Her behavior is normal. Judgment and thought content normal.  Nursing note and vitals reviewed.   ED Course  Procedures (including critical care time) Results for orders placed or performed during the hospital encounter of 01/06/16  Lipase, blood  Result Value Ref Range   Lipase 36 11 - 51 U/L  Comprehensive metabolic panel  Result Value Ref Range   Sodium 139 135 - 145 mmol/L   Potassium 4.4 3.5 - 5.1 mmol/L   Chloride 100 (L) 101 - 111 mmol/L   CO2 25 22 - 32 mmol/L   Glucose, Bld 101 (H) 65 - 99 mg/dL   BUN 16 6 - 20 mg/dL   Creatinine, Ser 0.72 0.44 - 1.00 mg/dL   Calcium 9.5 8.9 - 10.3 mg/dL   Total Protein 6.5 6.5 - 8.1 g/dL   Albumin 4.2 3.5 - 5.0 g/dL   AST 29 15 - 41 U/L   ALT 30 14 - 54 U/L   Alkaline Phosphatase 53 38 - 126 U/L   Total Bilirubin 1.1 0.3 - 1.2 mg/dL   GFR calc non Af Amer >60 >60 mL/min   GFR calc Af Amer >60 >60 mL/min   Anion gap 14 5 - 15  CBC  Result Value Ref Range   WBC 6.0 4.0 - 10.5 K/uL   RBC 3.93 3.87 - 5.11 MIL/uL   Hemoglobin 14.0 12.0 - 15.0 g/dL   HCT 41.5 36.0 - 46.0 %   MCV 105.6 (H) 78.0 - 100.0 fL   MCH 35.6 (H) 26.0 - 34.0 pg   MCHC 33.7 30.0 - 36.0 g/dL   RDW 14.2 11.5 - 15.5 %   Platelets 186 150 - 400 K/uL  Urinalysis, Routine w reflex microscopic (not at [facility])  Result Value Ref Range   Color, Urine YELLOW YELLOW   APPearance CLOUDY (A) CLEAR   Specific Gravity, Urine 1.013 1.005 - 1.030   pH 8.0 5.0 - 8.0   Glucose, UA NEGATIVE NEGATIVE mg/dL   Hgb urine dipstick NEGATIVE NEGATIVE   Bilirubin Urine NEGATIVE NEGATIVE   Ketones, ur 40 (A) NEGATIVE mg/dL   Protein,  ur NEGATIVE NEGATIVE mg/dL   Nitrite NEGATIVE NEGATIVE   Leukocytes, UA SMALL (A) NEGATIVE  Urine microscopic-add on  Result Value Ref Range   Squamous Epithelial / LPF 0-5 (A) NONE SEEN   WBC, UA 6-30 0 - 5 WBC/hpf   RBC / HPF NONE SEEN 0 - 5 RBC/hpf   Bacteria,  UA NONE SEEN NONE SEEN   Dg Ugi  W/kub  01/06/2016  CLINICAL DATA:  Dysphagia EXAM: UPPER GI SERIES WITH KUB TECHNIQUE: After obtaining a scout radiograph a routine upper GI series was performed using thin and thick barium FLUOROSCOPY TIME:  Radiation Exposure Index (as provided by the fluoroscopic device): 111 d Gy cm2 If the device does not provide the exposure index: Fluoroscopy Time (in minutes and seconds): Number of Acquired Images: COMPARISON:  Barium swallow 07/13/2009 FINDINGS: Fluoroscopic evaluation of swallowing demonstrates no fixed stricture within the esophagus. There is mild generalized tapering of the distal third of the esophagus. No fold thickening. There is an elongated stomach. No gastric emptying can be visualized during the study. The patient was left on her right side to for 15-20 minutes. Despite this, the stomach never emptied. There is retained food material throughout the stomach. IMPRESSION: No emptying of the stomach despite waiting up to 20 minutes. Findings compatible with gastric outlet obstruction. Retained food material within the stomach. Electronically Signed   By: Rolm Baptise M.D.   On: 01/06/2016 11:39     Imaging Review Dg Ugi  W/kub  01/06/2016  CLINICAL DATA:  Dysphagia EXAM: UPPER GI SERIES WITH KUB TECHNIQUE: After obtaining a scout radiograph a routine upper GI series was performed using thin and thick barium FLUOROSCOPY TIME:  Radiation Exposure Index (as provided by the fluoroscopic device): 111 d Gy cm2 If the device does not provide the exposure index: Fluoroscopy Time (in minutes and seconds): Number of Acquired Images: COMPARISON:  Barium swallow 07/13/2009 FINDINGS: Fluoroscopic evaluation of swallowing demonstrates no fixed stricture within the esophagus. There is mild generalized tapering of the distal third of the esophagus. No fold thickening. There is an elongated stomach. No gastric emptying can be visualized during the study. The patient was left on  her right side to for 15-20 minutes. Despite this, the stomach never emptied. There is retained food material throughout the stomach. IMPRESSION: No emptying of the stomach despite waiting up to 20 minutes. Findings compatible with gastric outlet obstruction. Retained food material within the stomach. Electronically Signed   By: Rolm Baptise M.D.   On: 01/06/2016 11:39   I have personally reviewed and evaluated these images and lab results as part of my medical decision-making.    MDM   Final diagnoses:  Gastric outlet obstruction    Patient with evidence of complete gastric outlet obstruction on KUB.  No emptying of stomach over 20 minutes.  Patient seen by and discussed with Dr. Darl Householder, who recommends consultation with GI.  Appreciate telephone consultation with Dr. Oletta Lamas from [place], who recommends admission, NG tube, and endoscopy this weekend.  Recommend admit to medicine and GI will consult and scope over the weekend.  Appreciate Dr. Waldron Labs for admitting the patient.    Spoke with Case Manager, who states that patient should qualify for inpatient.        Montine Circle, PA-C 01/06/16 Suring Yao, MD 01/09/16 1900

## 2016-01-06 NOTE — ED Notes (Signed)
Patient aware that a urine sample is needed, patient will notify staff when able to provide sample

## 2016-01-07 ENCOUNTER — Encounter (HOSPITAL_COMMUNITY)
Admission: EM | Disposition: A | Discharge status: Home Health | Payer: Self-pay | Source: Home / Self Care | Attending: Internal Medicine

## 2016-01-07 DIAGNOSIS — K3184 Gastroparesis: Secondary | ICD-10-CM | POA: Diagnosis not present

## 2016-01-07 DIAGNOSIS — K311 Adult hypertrophic pyloric stenosis: Secondary | ICD-10-CM | POA: Diagnosis not present

## 2016-01-07 DIAGNOSIS — R32 Unspecified urinary incontinence: Secondary | ICD-10-CM | POA: Diagnosis present

## 2016-01-07 DIAGNOSIS — I480 Paroxysmal atrial fibrillation: Secondary | ICD-10-CM | POA: Diagnosis not present

## 2016-01-07 DIAGNOSIS — R197 Diarrhea, unspecified: Secondary | ICD-10-CM | POA: Diagnosis present

## 2016-01-07 DIAGNOSIS — Z955 Presence of coronary angioplasty implant and graft: Secondary | ICD-10-CM | POA: Diagnosis not present

## 2016-01-07 DIAGNOSIS — N289 Disorder of kidney and ureter, unspecified: Secondary | ICD-10-CM | POA: Diagnosis not present

## 2016-01-07 DIAGNOSIS — I2510 Atherosclerotic heart disease of native coronary artery without angina pectoris: Secondary | ICD-10-CM | POA: Diagnosis present

## 2016-01-07 DIAGNOSIS — Z9049 Acquired absence of other specified parts of digestive tract: Secondary | ICD-10-CM | POA: Diagnosis not present

## 2016-01-07 DIAGNOSIS — K2980 Duodenitis without bleeding: Secondary | ICD-10-CM | POA: Diagnosis not present

## 2016-01-07 DIAGNOSIS — K3189 Other diseases of stomach and duodenum: Secondary | ICD-10-CM | POA: Diagnosis not present

## 2016-01-07 DIAGNOSIS — R112 Nausea with vomiting, unspecified: Secondary | ICD-10-CM | POA: Diagnosis not present

## 2016-01-07 DIAGNOSIS — H3530 Unspecified macular degeneration: Secondary | ICD-10-CM | POA: Diagnosis present

## 2016-01-07 DIAGNOSIS — K273 Acute peptic ulcer, site unspecified, without hemorrhage or perforation: Secondary | ICD-10-CM | POA: Diagnosis not present

## 2016-01-07 DIAGNOSIS — M05712 Rheumatoid arthritis with rheumatoid factor of left shoulder without organ or systems involvement: Secondary | ICD-10-CM | POA: Diagnosis not present

## 2016-01-07 DIAGNOSIS — K219 Gastro-esophageal reflux disease without esophagitis: Secondary | ICD-10-CM | POA: Diagnosis present

## 2016-01-07 DIAGNOSIS — M05711 Rheumatoid arthritis with rheumatoid factor of right shoulder without organ or systems involvement: Secondary | ICD-10-CM | POA: Diagnosis not present

## 2016-01-07 DIAGNOSIS — Z7952 Long term (current) use of systemic steroids: Secondary | ICD-10-CM | POA: Diagnosis not present

## 2016-01-07 DIAGNOSIS — I471 Supraventricular tachycardia: Secondary | ICD-10-CM | POA: Diagnosis not present

## 2016-01-07 DIAGNOSIS — M4316 Spondylolisthesis, lumbar region: Secondary | ICD-10-CM | POA: Diagnosis present

## 2016-01-07 DIAGNOSIS — M810 Age-related osteoporosis without current pathological fracture: Secondary | ICD-10-CM | POA: Diagnosis present

## 2016-01-07 DIAGNOSIS — R109 Unspecified abdominal pain: Secondary | ICD-10-CM | POA: Diagnosis not present

## 2016-01-07 DIAGNOSIS — M069 Rheumatoid arthritis, unspecified: Secondary | ICD-10-CM | POA: Diagnosis present

## 2016-01-07 DIAGNOSIS — Z8711 Personal history of peptic ulcer disease: Secondary | ICD-10-CM | POA: Diagnosis not present

## 2016-01-07 DIAGNOSIS — I4892 Unspecified atrial flutter: Secondary | ICD-10-CM | POA: Diagnosis not present

## 2016-01-07 DIAGNOSIS — I10 Essential (primary) hypertension: Secondary | ICD-10-CM | POA: Diagnosis not present

## 2016-01-07 DIAGNOSIS — E785 Hyperlipidemia, unspecified: Secondary | ICD-10-CM | POA: Diagnosis not present

## 2016-01-07 DIAGNOSIS — Z6821 Body mass index (BMI) 21.0-21.9, adult: Secondary | ICD-10-CM | POA: Diagnosis not present

## 2016-01-07 DIAGNOSIS — K2900 Acute gastritis without bleeding: Secondary | ICD-10-CM | POA: Diagnosis not present

## 2016-01-07 DIAGNOSIS — I350 Nonrheumatic aortic (valve) stenosis: Secondary | ICD-10-CM | POA: Diagnosis present

## 2016-01-07 DIAGNOSIS — E43 Unspecified severe protein-calorie malnutrition: Secondary | ICD-10-CM | POA: Diagnosis not present

## 2016-01-07 HISTORY — PX: ESOPHAGOGASTRODUODENOSCOPY: SHX5428

## 2016-01-07 LAB — COMPREHENSIVE METABOLIC PANEL
ALBUMIN: 3.1 g/dL — AB (ref 3.5–5.0)
ALK PHOS: 38 U/L (ref 38–126)
ALT: 24 U/L (ref 14–54)
AST: 22 U/L (ref 15–41)
Anion gap: 11 (ref 5–15)
BILIRUBIN TOTAL: 1.2 mg/dL (ref 0.3–1.2)
BUN: 12 mg/dL (ref 6–20)
CALCIUM: 8.2 mg/dL — AB (ref 8.9–10.3)
CO2: 21 mmol/L — ABNORMAL LOW (ref 22–32)
CREATININE: 0.63 mg/dL (ref 0.44–1.00)
Chloride: 107 mmol/L (ref 101–111)
GFR calc Af Amer: >60 mL/min (ref >60)
GLUCOSE: 76 mg/dL (ref 65–99)
POTASSIUM: 4 mmol/L (ref 3.5–5.1)
Sodium: 139 mmol/L (ref 135–145)
TOTAL PROTEIN: 5 g/dL — AB (ref 6.5–8.1)

## 2016-01-07 SURGERY — EGD (ESOPHAGOGASTRODUODENOSCOPY)
Anesthesia: Moderate Sedation

## 2016-01-07 MED ORDER — SODIUM CHLORIDE 0.9% FLUSH
10.0000 mL | INTRAVENOUS | Status: DC | PRN
  Administered 2016-01-10 – 2016-01-16 (×5): 10 mL
  Filled 2016-01-07 (×5): qty 40

## 2016-01-07 MED ORDER — PANTOPRAZOLE SODIUM 40 MG IV SOLR
40.0000 mg | Freq: Two times a day (BID) | INTRAVENOUS | Status: DC
Start: 2016-01-07 — End: 2016-01-08
  Administered 2016-01-07: 40 mg via INTRAVENOUS
  Filled 2016-01-07 (×4): qty 40

## 2016-01-07 MED ORDER — DIPHENHYDRAMINE HCL 50 MG/ML IJ SOLN
INTRAMUSCULAR | Status: AC
  Filled 2016-01-07: qty 1

## 2016-01-07 MED ORDER — FAT EMULSION 20 % IV EMUL
240.0000 mL | INTRAVENOUS | Status: AC
  Administered 2016-01-08: 240 mL via INTRAVENOUS
  Filled 2016-01-07: qty 240

## 2016-01-07 MED ORDER — MIDAZOLAM HCL 5 MG/ML IJ SOLN
INTRAMUSCULAR | Status: AC
  Filled 2016-01-07: qty 3

## 2016-01-07 MED ORDER — SODIUM CHLORIDE 0.9% FLUSH
10.0000 mL | Freq: Two times a day (BID) | INTRAVENOUS | Status: DC
  Administered 2016-01-08: 10 mL
  Administered 2016-01-10: 20 mL
  Administered 2016-01-10 – 2016-01-15 (×7): 10 mL

## 2016-01-07 MED ORDER — METHYLPREDNISOLONE SODIUM SUCC 40 MG IJ SOLR
20.0000 mg | Freq: Every day | INTRAMUSCULAR | Status: DC
  Administered 2016-01-07 – 2016-01-13 (×7): 20 mg via INTRAVENOUS
  Filled 2016-01-07 (×7): qty 0.5

## 2016-01-07 MED ORDER — M.V.I. ADULT IV INJ
INJECTION | INTRAVENOUS | Status: AC
Start: 2016-01-07 — End: 2016-01-08
  Administered 2016-01-08: 01:00:00 via INTRAVENOUS
  Filled 2016-01-07: qty 960

## 2016-01-07 MED ORDER — LORAZEPAM 2 MG/ML IJ SOLN: 0.5000 mg | Freq: Every evening | INTRAMUSCULAR | Status: DC | PRN

## 2016-01-07 MED ORDER — MENTHOL 3 MG MT LOZG
1.0000 | LOZENGE | OROMUCOSAL | Status: DC | PRN
  Filled 2016-01-07 (×2): qty 9

## 2016-01-07 MED ORDER — FENTANYL CITRATE (PF) 100 MCG/2ML IJ SOLN
INTRAMUSCULAR | Status: AC
  Filled 2016-01-07: qty 2

## 2016-01-07 MED ORDER — SODIUM CHLORIDE 0.9 % IV SOLN
INTRAVENOUS | Status: AC
  Administered 2016-01-08 – 2016-01-09 (×2): via INTRAVENOUS

## 2016-01-07 MED ORDER — FENTANYL CITRATE (PF) 100 MCG/2ML IJ SOLN
INTRAMUSCULAR | Status: DC | PRN
  Administered 2016-01-07 (×3): 25 ug via INTRAVENOUS

## 2016-01-07 MED ORDER — MIDAZOLAM HCL 10 MG/2ML IJ SOLN
INTRAMUSCULAR | Status: DC | PRN
  Administered 2016-01-07: 2 mg via INTRAVENOUS
  Administered 2016-01-07 (×2): 1 mg via INTRAVENOUS
  Administered 2016-01-07: 2 mg via INTRAVENOUS
  Administered 2016-01-07 (×2): 1 mg via INTRAVENOUS

## 2016-01-07 NOTE — Progress Notes (Signed)
Patient Demographics  Angela [name], is a 80 y.o. female, DOB - 05/27/1936, [id number]  Admit date - 01/06/2016   Admitting Physician Albertine Patricia, MD  Outpatient Primary MD for the patient is Irven Shelling, MD  LOS - 1   Chief Complaint  Patient presents with  . Abdominal Pain        Subjective:   Angela [name] today has, No headache, No chest pain, No abdominal pain -reports mild nausea , tolerating NG tube . Assessment & Plan    Active Problems:   Rheumatoid arthritis ([place])   Dyslipidemia   Essential hypertension, benign   Gastric outlet obstruction   Gastroparesis   gastric outlet obstruction  - Status post endoscopy today, with evidence of gastric outlet obstruction with polypoid lesion in pyloric channel and surrounding ulcer and edema. - Follow on pathology results . - continue nothing by mouth per GI recommendation - Start on TPN given patient's malnutrition and significant weight loss recently, and her nothing by mouth status, which might be prolonged, and possible need for surgery.   rheumatoid arthritis  - Patient is a steroid dependent, will start on low dose IV Solu-Medrol  - Continue to hold methotrexate   Coronary artery disease - Denies any chest pain or shortness of breath,  -  will continue to hold Plavix as may need intervention.  History of PVC -Patient is nothing by mouth, will continue to hold beta blockers  Hyperlipidemia - Resume statin when stable  Diarrhea - Patient reports watery diarrhea, just finished her by mouth antibiotics secondary to tooth infection, follow C. difficile results   Code Status: Full  Family Communication: discussed with patient   Disposition Plan: pending further work up.   Procedures  EGD 01/07/2016    Consults   Gastroenterology    Medications  Scheduled Meds: . acebutolol  200 mg Oral BID  .  fluticasone  1 spray Each Nare Daily  . heparin  5,000 Units Subcutaneous 3 times per day  . LORazepam  2 mg Oral QHS  . nystatin cream   Topical BID  . pantoprazole  40 mg Oral BID  . predniSONE  5 mg Oral Daily   Continuous Infusions: . sodium chloride 75 mL/hr at 01/06/16 2045   PRN Meds:.albuterol, menthol-cetylpyridinium  DVT Prophylaxis   Heparin - SCDs   Lab Results  Component Value Date   PLT 186 01/06/2016    Antibiotics    Anti-infectives    None          Objective:   Filed Vitals:   01/07/16 0905 01/07/16 0910 01/07/16 0915 01/07/16 1416  BP: 88/33 83/38 100/47 91/46  Pulse: 69 66 69 70  Temp:    97.8 F (36.6 C)  TempSrc:    Oral  Resp: 18 19 18 18   Height:      Weight:      SpO2: 100% 100% 100% 97%    Wt Readings from Last 3 Encounters:  01/07/16 53.071 kg (117 lb)  12/07/15 55.792 kg (123 lb)  11/01/15 56.7 kg (125 lb)     Intake/Output Summary (Last 24 hours) at 01/07/16 1534 Last data filed at 01/07/16 1437  Gross per 24 hour  Intake 1393.75 ml  Output  1850 ml  Net -456.25 ml     Physical Exam  Awake Alert, Oriented X 3, No new F.N deficits, Normal affect [place].AT,PERRAL Supple Neck,No JVD, No cervical lymphadenopathy appriciated.  Symmetrical Chest wall movement, Good air movement bilaterally, CTAB RRR,No Gallops,Rubs or new Murmurs, No Parasternal Heave +ve B.Sounds, Abd Soft, No tenderness, No organomegaly appriciated, No rebound - guarding or rigidity. No Cyanosis, Clubbing or edema, No new Rash or bruise     Data Review   Micro Results No results found for this or any previous visit (from the past 240 hour(s)).  Radiology Reports Dg Ugi  W/kub  01/06/2016  CLINICAL DATA:  Dysphagia EXAM: UPPER GI SERIES WITH KUB TECHNIQUE: After obtaining a scout radiograph a routine upper GI series was performed using thin and thick barium FLUOROSCOPY TIME:  Radiation Exposure Index (as provided by the fluoroscopic device): 111 d Gy cm2  If the device does not provide the exposure index: Fluoroscopy Time (in minutes and seconds): Number of Acquired Images: COMPARISON:  Barium swallow 07/13/2009 FINDINGS: Fluoroscopic evaluation of swallowing demonstrates no fixed stricture within the esophagus. There is mild generalized tapering of the distal third of the esophagus. No fold thickening. There is an elongated stomach. No gastric emptying can be visualized during the study. The patient was left on her right side to for 15-20 minutes. Despite this, the stomach never emptied. There is retained food material throughout the stomach. IMPRESSION: No emptying of the stomach despite waiting up to 20 minutes. Findings compatible with gastric outlet obstruction. Retained food material within the stomach. Electronically Signed   By: Rolm Baptise M.D.   On: 01/06/2016 11:39     CBC  Recent Labs Lab 01/06/16 1356  WBC 6.0  HGB 14.0  HCT 41.5  PLT 186  MCV 105.6*  MCH 35.6*  MCHC 33.7  RDW 14.2    Chemistries   Recent Labs Lab 01/06/16 1356 01/07/16 0627  NA 139 139  K 4.4 4.0  CL 100* 107  CO2 25 21*  GLUCOSE 101* 76  BUN 16 12  CREATININE 0.72 0.63  CALCIUM 9.5 8.2*  AST 29 22  ALT 30 24  ALKPHOS 53 38  BILITOT 1.1 1.2   ------------------------------------------------------------------------------------------------------------------ estimated creatinine clearance is 46.6 mL/min (by C-G formula based on Cr of 0.63). ------------------------------------------------------------------------------------------------------------------ No results for input(s): HGBA1C in the last 72 hours. ------------------------------------------------------------------------------------------------------------------ No results for input(s): CHOL, HDL, LDLCALC, TRIG, CHOLHDL, LDLDIRECT in the last 72 hours. ------------------------------------------------------------------------------------------------------------------ No results for  input(s): TSH, T4TOTAL, T3FREE, THYROIDAB in the last 72 hours.  Invalid input(s): FREET3 ------------------------------------------------------------------------------------------------------------------ No results for input(s): VITAMINB12, FOLATE, FERRITIN, TIBC, IRON, RETICCTPCT in the last 72 hours.  Coagulation profile No results for input(s): INR, PROTIME in the last 168 hours.  No results for input(s): DDIMER in the last 72 hours.  Cardiac Enzymes No results for input(s): CKMB, TROPONINI, MYOGLOBIN in the last 168 hours.  Invalid input(s): CK ------------------------------------------------------------------------------------------------------------------ Invalid input(s): POCBNP     Time Spent in minutes   25 minutes   ELGERGAWY, DAWOOD M.D on 01/07/2016 at 3:34 PM  Between 7am to 7pm - Pager - [phone]  After 7pm go to [email or web address] - password [facility]  [facility]   Office  [phone]

## 2016-01-07 NOTE — Progress Notes (Signed)
PARENTERAL NUTRITION CONSULT NOTE - INITIAL  Pharmacy Consult for TPN Indication: Gastric outlet obstruction  Allergies  Allergen Reactions  . Peanut-Containing Drug Products Anaphylaxis  . Reglan [Metoclopramide] Other (See Comments)    "Messed my mind up"  . Codeine Nausea Only  . Iohexol Swelling     Desc: pt received hypaque 60% in 1994 and had erythema, hives & lips swell.  She did ok w/o premeds in 8/05 at [facility].  Pt. takes daily prednisone for rheumatoid arthritis.  Poor venous access today (7/7/6) so we did her w/o iv contrast.   . Pineapple Swelling  . Shellfish Allergy Swelling    Lips swell   Patient Measurements: Height: 5\' 2"  (157.5 cm) Weight: 117 lb (53.071 kg) IBW/kg (Calculated) : 50.1 Adjusted Body Weight:  Usual Weight:   Vital Signs: Temp: 97.8 F (36.6 C) (03/04 1416) Temp Source: Oral (03/04 1416) BP: 91/46 mmHg (03/04 1416) Pulse Rate: 70 (03/04 1416) Intake/Output from previous day: 03/03 0701 - 03/04 0700 In: 693.8 [I.V.:693.8] Out: 1000 [Urine:600; Emesis/NG output:400] Intake/Output from this shift: Total I/O In: 700 [I.V.:700] Out: 850 [Urine:600; Emesis/NG output:250]  Labs:  Recent Labs  01/06/16 1356  WBC 6.0  HGB 14.0  HCT 41.5  PLT 186    Recent Labs  01/06/16 1356 01/07/16 0627  NA 139 139  K 4.4 4.0  CL 100* 107  CO2 25 21*  GLUCOSE 101* 76  BUN 16 12  CREATININE 0.72 0.63  CALCIUM 9.5 8.2*  PROT 6.5 5.0*  ALBUMIN 4.2 3.1*  AST 29 22  ALT 30 24  ALKPHOS 53 38  BILITOT 1.1 1.2   Estimated Creatinine Clearance: 46.6 mL/min (by C-G formula based on Cr of 0.63).   No results for input(s): GLUCAP in the last 72 hours.  Medical History: Past Medical History  Diagnosis Date  . Cervical stenosis of spine   . Spondylolisthesis of lumbar region   . A-fib ([place])   . Asthma   . Depression   . Dyslipidemia   . GERD (gastroesophageal reflux disease)   . Obesity   . PVC's (premature ventricular contractions)   .  PUD (peptic ulcer disease)     can not take aspirin  . Osteoporosis     alendronate since 2012  . Macular degeneration   . Aortic stenosis     mild by echo 03/2012  . Hypertension   . CAD (coronary artery disease)     s/p PCI with BMS to mid LAD--2/08 not on aspirin due to frequent ulcers.  Repeat cath for CP 09/2012 with patent LAD stent and otherwise normal coronary arteries  . Elbow fracture, right     history of- never any surgery  . Rheumatoid arthritis(714.0)     Dr. Lenna Gilford follows  . Impaired physical mobility     due to rheumatoid arthritis "ambulates with walker" limited free standing.   Medications:  Scheduled:  . fluticasone  1 spray Each Nare Daily  . heparin  5,000 Units Subcutaneous 3 times per day  . LORazepam  2 mg Oral QHS  . methylPREDNISolone (SOLU-MEDROL) injection  20 mg Intravenous Daily  . nystatin cream   Topical BID  . pantoprazole (PROTONIX) IV  40 mg Intravenous Q12H   Infusions:  . sodium chloride 75 mL/hr at 01/06/16 2045   Insulin Requirements:   Current Nutrition: strict NPO  IVF: NS at 75 ml/hr  Central access: 3/4 TPN start date:   ASSESSMENT  HPI: 49 yoF with recurrent abd pain since June 2016. Complaint of severe nausea, heartburn, loose stools, 20 lb weight loss in 3 months.  EGD 12/16 with large amount of retained food.  EGD 3/4: Gastric Outlet Obstruction with polypoid lesion, ulcer,edema, gastritis. Possible gastrojejunostomy, await pathology report.  Significant events:   Today:   Glucose -   Electrolytes - wnl  Renal - wnl  LFTs -  TGs -  Prealbumin -  NUTRITIONAL GOALS                                                                                             RD recs: await recommendations Clinimix / at a goal rate of 80 ml/hr + 20% fat emulsion at 10 ml/hr to provide: 96g/day protein, 1850 Kcal/day.  PLAN                                                                                                                          At 1800 today:  Start Clinimix E 5/15 at 40 ml/hr.  20% fat emulsion at 10 ml/hr.  Plan to advance as tolerated to the goal rate.  TPN to contain standard multivitamins and trace elements.  Reduce IVF to 35 ml/hr.  Add/Change SSI .   TPN lab panels on Mondays & Thursdays.  F/u daily.  Minda Ditto PharmD Pager [phone] 01/07/2016, 5:06 PM

## 2016-01-07 NOTE — Brief Op Note (Signed)
Gastric Outlet Obstruction with polypoid lesion in pyloric channel and surrounding ulcer and edema (see endopro note for details). Strict NPO. F/U on path. Surgical consult when path results complete because may need gastrojejunostomy. Continue supportive care. IV PPI 40 mg Q 12 hours.

## 2016-01-07 NOTE — H&P (View-Only) (Signed)
Referring Provider: Dr. Waldron Labs Primary Care Physician:  Irven Shelling, MD Primary Gastroenterologist:  Dr. Wynetta Emery  Reason for Consultation:  Gastric Outlet Obstruction  HPI: Angela [name] is a 80 y.o. female with recurrent abdominal pain each day that initially started occurring in June 2016. Reports having minimal PO intake for the past few months due to diffuse abdominal pain and a "hardening" of her abdomen during the middle part of the day. Severe nausea but denies having any vomiting or regurgitation each day. +Heartburn. Abdominal pain would worsen during the afternoon and evening. Eating very little food over the past few months. Has lost 20 pounds unintentionally in the past 3 months. Denies melena or hematochezia. Recently has had loose stools. Incomplete EGD in December 2016 by Dr. Wynetta Emery where the distal stomach was not visualized due to a large amount of retained food. UGIS today showed gastric outlet obstruction. She was thought to have gastroparesis based on her incomplete EGD. EGD in 2011 (Dr. Wynetta Emery) showed retained food in the fundus and a normal antrum, pylorus and duodenal bulb. On Plavix for Afib. On Prednisone for RA. Denies NSAIDs. Daughter at bedside who helps with the history.   Past Medical History  Diagnosis Date  . Cervical stenosis of spine   . Spondylolisthesis of lumbar region   . A-fib ([place])   . Asthma   . Depression   . Dyslipidemia   . GERD (gastroesophageal reflux disease)   . Obesity   . PVC's (premature ventricular contractions)   . PUD (peptic ulcer disease)     can not take aspirin  . Osteoporosis     alendronate since 2012  . Macular degeneration   . Aortic stenosis     mild by echo 03/2012  . Hypertension   . CAD (coronary artery disease)     s/p PCI with BMS to mid LAD--2/08 not on aspirin due to frequent ulcers.  Repeat cath for CP 09/2012 with patent LAD stent and otherwise normal coronary arteries  . Elbow fracture, right    history of- never any surgery  . Rheumatoid arthritis(714.0)     Dr. Lenna Gilford follows  . Impaired physical mobility     due to rheumatoid arthritis "ambulates with walker" limited free standing.    Past Surgical History  Procedure Laterality Date  . Fracture surgery  09/2011    left ankle screw and pin removed  . Cardiac catheterization  09/2012    patent LAD stent and otherwise normal coronary arteries  . Cervical fusion      limited  ROM  . Tonsillectomy    . Cataract extraction, bilateral Bilateral   . Joint replacement Bilateral     BTKA  . Hand surgery Bilateral     x4 digits 2-5 both hands  . Ankle fusion Bilateral     x2 both  . Cholecystectomy    . Esophagogastroduodenoscopy (egd) with propofol N/A 11/01/2015    Procedure: ESOPHAGOGASTRODUODENOSCOPY (EGD) WITH PROPOFOL w/ Dialtation;  Surgeon: Garlan Fair, MD;  Location: [facility];  Service: Endoscopy;  Laterality: N/A;    Prior to Admission medications   Medication Sig Start Date End Date Taking? Authorizing Provider  acebutolol (SECTRAL) 200 MG capsule TAKE 1 CAPSULE (200 MG TOTAL) BY MOUTH 2 (TWO) TIMES DAILY. 11/24/15  Yes Sueanne Margarita, MD  Calcium Carbonate-Vitamin D (CALCIUM 500+D PO) Take 1 tablet by mouth 3 (three) times daily.   Yes Historical Provider, MD  clopidogrel (PLAVIX) 75 MG tablet TAKE 1 TABLET (75  MG TOTAL) BY MOUTH EVERY EVENING. 10/24/15  Yes Sueanne Margarita, MD  diclofenac sodium (VOLTAREN) 1 % GEL Apply 2 g topically 2 (two) times daily as needed. For pain Patient taking differently: Apply 2 g topically 2 (two) times daily. For pain 10/19/15  Yes Meredith Staggers, MD  Docusate Calcium (STOOL SOFTENER PO) Take 1 tablet by mouth at bedtime.   Yes Historical Provider, MD  folic acid (FOLVITE) 1 MG tablet Take 1 mg by mouth daily.  12/03/13  Yes Historical Provider, MD  furosemide (LASIX) 40 MG tablet Take 1 tablet (40 mg total) by mouth daily. You may take extra 1/2 tablet on days when you have  extra swelling Patient taking differently: Take 40 mg by mouth daily.  12/07/15  Yes Scott T Weaver, PA-C  lidocaine (LIDODERM) 5 % APPLY 1 PATCH ONTO THE SKIN DAILY REMOVE AND DISCARD PATCH WITHIN 12 HOURS OR AS DIRECTED BY PRESCRIBER Patient taking differently: Place 1 patch onto the skin daily. APPLY NECK AND ELBOW 10/19/15  Yes Meredith Staggers, MD  LORazepam (ATIVAN) 2 MG tablet Take 2 mg by mouth at bedtime. 02/05/15  Yes Historical Provider, MD  mometasone (NASONEX) 50 MCG/ACT nasal spray Place 2 sprays into the nose daily as needed (nasal congestion).   Yes Historical Provider, MD  nystatin (MYCOSTATIN) 100000 UNIT/ML suspension Take 5 mLs by mouth 4 (four) times daily.   Yes Historical Provider, MD  nystatin cream (MYCOSTATIN) Apply 1 application topically daily as needed for dry skin.   Yes Historical Provider, MD  oxyCODONE-acetaminophen (PERCOCET) 7.5-325 MG tablet Take 1 tablet by mouth every 6 (six) hours as needed. Patient taking differently: Take 1 tablet by mouth every 6 (six) hours as needed for moderate pain.  10/19/15  Yes Meredith Staggers, MD  pantoprazole (PROTONIX) 40 MG tablet Take 40 mg by mouth 2 (two) times daily.    Yes Historical Provider, MD  predniSONE (DELTASONE) 5 MG tablet Take 5 mg by mouth daily.   Yes Historical Provider, MD  PREVIDENT 0.2 % SOLN Take 1 each by mouth at bedtime. Rinses with about 1 capful nightly 09/05/12  Yes Historical Provider, MD  simvastatin (ZOCOR) 40 MG tablet TAKE 1 TABLET BY MOUTH AT BEDTIME 12/09/15  Yes Sueanne Margarita, MD  TOVIAZ 8 MG TB24 tablet Take 8 mg by mouth daily. 10/25/15  Yes Historical Provider, MD  butalbital-acetaminophen-caffeine (FIORICET, ESGIC) 50-325-40 MG per tablet Take 1 tablet by mouth every 6 (six) hours as needed for headache or migraine. For migraines 06/23/12   Historical Provider, MD  EPIPEN 2-PAK 0.3 MG/0.3ML SOAJ Inject 0.3 mg into the muscle daily as needed (for allergic reaction).  03/12/13   Historical Provider, MD   fluticasone (FLONASE) 50 MCG/ACT nasal spray Place 1 spray into both nostrils daily as needed for allergies.  12/01/14   Historical Provider, MD  methotrexate (RHEUMATREX) 2.5 MG tablet Take 15 mg by mouth once a week. On Saturdays 03/01/12   Historical Provider, MD  NITROSTAT 0.4 MG SL tablet Place 0.4 mg under the tongue every 5 (five) minutes as needed for chest pain.  09/16/12   Historical Provider, MD    Scheduled Meds: Continuous Infusions: PRN Meds:.  Allergies as of 01/06/2016 - Review Complete 01/06/2016  Allergen Reaction Noted  . Peanut-containing drug products Anaphylaxis 01/29/2012  . Reglan [metoclopramide] Other (See Comments) 06/04/2012  . Codeine Nausea Only 03/28/2012  . Iohexol Swelling 05/11/2005  . Pineapple Swelling 12/08/2014  . Shellfish allergy Swelling  12/04/2011    Family History  Problem Relation Age of Onset  . Lung cancer Mother   . Melanoma Brother   . Arrhythmia Mother   . Heart attack Neg Hx     Social History   Social History  . Marital Status: Widowed    Spouse Name: N/A  . Number of Children: N/A  . Years of Education: N/A   Occupational History  . Not on file.   Social History Main Topics  . Smoking status: Never Smoker   . Smokeless tobacco: Never Used  . Alcohol Use: No  . Drug Use: No  . Sexual Activity: Not on file   Other Topics Concern  . Not on file   Social History Narrative    Review of Systems: All negative except as stated above in HPI.  Physical Exam: Vital signs: Filed Vitals:   01/06/16 1610 01/06/16 1844  BP: 110/70 127/75  Pulse: 72 71  Temp: 97.8 F (36.6 C) 97.8 F (36.6 C)  Resp: 16 16     General:   Alert,  Elderly, thin, pleasant and cooperative in NAD Head: atraumatic Eyes: anicteric sclera ENT: oropharynx clear Neck: supple, nontender Lungs:  Clear throughout to auscultation.   No wheezes, crackles, or rhonchi. No acute distress. Heart:  Regular rate and rhythm; no murmurs, clicks, rubs,   or gallops. Abdomen: mild distention, nontender, +BS  Rectal:  Deferred Ext: no edema Skin: no rash  GI:  Lab Results:  Recent Labs  01/06/16 1356  WBC 6.0  HGB 14.0  HCT 41.5  PLT 186   BMET  Recent Labs  01/06/16 1356  NA 139  K 4.4  CL 100*  CO2 25  GLUCOSE 101*  BUN 16  CREATININE 0.72  CALCIUM 9.5   LFT  Recent Labs  01/06/16 1356  PROT 6.5  ALBUMIN 4.2  AST 29  ALT 30  ALKPHOS 53  BILITOT 1.1   PT/INR No results for input(s): LABPROT, INR in the last 72 hours.   Studies/Results: Dg Ugi  W/kub  01/06/2016  CLINICAL DATA:  Dysphagia EXAM: UPPER GI SERIES WITH KUB TECHNIQUE: After obtaining a scout radiograph a routine upper GI series was performed using thin and thick barium FLUOROSCOPY TIME:  Radiation Exposure Index (as provided by the fluoroscopic device): 111 d Gy cm2 If the device does not provide the exposure index: Fluoroscopy Time (in minutes and seconds): Number of Acquired Images: COMPARISON:  Barium swallow 07/13/2009 FINDINGS: Fluoroscopic evaluation of swallowing demonstrates no fixed stricture within the esophagus. There is mild generalized tapering of the distal third of the esophagus. No fold thickening. There is an elongated stomach. No gastric emptying can be visualized during the study. The patient was left on her right side to for 15-20 minutes. Despite this, the stomach never emptied. There is retained food material throughout the stomach. IMPRESSION: No emptying of the stomach despite waiting up to 20 minutes. Findings compatible with gastric outlet obstruction. Retained food material within the stomach. Electronically Signed   By: Rolm Baptise M.D.   On: 01/06/2016 11:39    Impression/Plan: 80 yo with gastric outlet obstruction and weight loss concerning for a malignancy vs. peptic ulcer disease. NG tube needed to suction gastric contents. EGD tomorrow morning to further evaluate for a source of the GOO. NPO. Supportive care.    LOS:  0 days   [place] C.  01/06/2016, 6:48 PM  Pager [phone]  If no answer or after 5 PM call [phone]

## 2016-01-07 NOTE — Progress Notes (Signed)
Peripherally Inserted Central Catheter/Midline Placement  The IV Nurse has discussed with the patient and/or persons authorized to consent for the patient, the purpose of this procedure and the potential benefits and risks involved with this procedure.  The benefits include less needle sticks, lab draws from the catheter and patient may be discharged home with the catheter.  Risks include, but not limited to, infection, bleeding, blood clot (thrombus formation), and puncture of an artery; nerve damage and irregular heat beat.  Alternatives to this procedure were also discussed.  PICC/Midline Placement Documentation  PICC Double Lumen [id number] PICC Right Basilic 34 cm 0 cm (Active)  Indication for Insertion or Continuance of Line Administration of hyperosmolar/irritating solutions (i.e. TPN, Vancomycin, etc.) 01/07/2016  4:23 PM  Exposed Catheter (cm) 0 cm 01/07/2016  4:23 PM  Site Assessment Clean;Dry;Intact 01/07/2016  4:23 PM  Lumen #1 Status Flushed;Saline locked;Blood return noted 01/07/2016  4:23 PM  Lumen #2 Status Flushed;Saline locked;Blood return noted 01/07/2016  4:23 PM  Dressing Type Transparent 01/07/2016  4:23 PM  Dressing Status Clean;Dry;Intact 01/07/2016  4:23 PM  Dressing Change Due 01/14/16 01/07/2016  4:23 PM       Gordan Payment 01/07/2016, 4:24 PM

## 2016-01-07 NOTE — Op Note (Addendum)
[facility] [place], [address]   ENDOSCOPY PROCEDURE REPORT  PATIENT: Angela [name], Angela [name]  MR#: [id number] BIRTHDATE: [birth year] , 12  yrs. old GENDER: female ENDOSCOPIST: Wilford Corner, MD REFERRED BY:  hospital team PROCEDURE DATE:  02/06/2016 PROCEDURE:  EGD w/ biopsy ASA CLASS:     Class III INDICATIONS:  Gastric Outlet Obstruction, nausea, weight loss. MEDICATIONS: Fentanyl 75 mcg IV and Versed 8 mg IV TOPICAL ANESTHETIC: none  DESCRIPTION OF PROCEDURE: After the risks benefits and alternatives of the procedure were thoroughly explained, informed consent was obtained.  The Pentax Gastroscope [id number] endoscope was introduced through the mouth and advanced to the second portion of the duodenum , Without limitations.  The instrument was slowly withdrawn as the mucosa was fully examined. Estimated blood loss is zero unless otherwise noted in this procedure report.    Moderate amount of barium and food particles in the fundus and proximal body. J-shaped stomach. Antrum edematous and pylorus also edematous with pyloric channel narrowing. Due to excessive looping unable to intubate into the duodenal bulb so the standard endoscope was removed and an enteroscope was inserted. The pyloric channel was very edematous and ulcerated and a polypoid lesion noted in the pyloric channel. Duodenal bulb edematous and ulcerated proximally. Distal duodenal bulb unremarkable and 2nd portion of the duodenum normal in appearance.        Retroflexed views revealed Limited view of proximal stomach due to barium and food particles obscuring view.     The scope was then withdrawn from the patient and the procedure completed.  COMPLICATIONS: There were no immediate complications.  ENDOSCOPIC IMPRESSION:     Gastric Outlet Obstruction - polypoid lesion noted with surrounding ulcer and edema - s/p biopsies Gastritis (distal stomach)  RECOMMENDATIONS:     Strict NPO;  F/U on path; May need surgical consult for gastrojejunostomy (await path); Supportive care   eSigned:  Wilford Corner, MD 06-Feb-2016 12:47 PM Revised: 2016/02/06 12:47 PM   CC:  CPT CODES: ICD CODES:  The ICD and CPT codes recommended by this software are interpretations from the data that the clinical staff has captured with the software.  The verification of the translation of this report to the ICD and CPT codes and modifiers is the sole responsibility of the health care institution and practicing physician where this report was generated.  [employer]. will not be held responsible for the validity of the ICD and CPT codes included on this report.  AMA assumes no liability for data contained or not contained herein. CPT is a Designer, television/film set of the [employer].  PATIENT NAME:  [name], [name] MR#: [id number]

## 2016-01-07 NOTE — Interval H&P Note (Signed)
History and Physical Interval Note:  01/07/2016 8:16 AM  Angela [name]  has presented today for surgery, with the diagnosis of Gastric outlet obstruction  The various methods of treatment have been discussed with the patient and family. After consideration of risks, benefits and other options for treatment, the patient has consented to  Procedure(s): ESOPHAGOGASTRODUODENOSCOPY (EGD) (N/A) as a surgical intervention .  The patient's history has been reviewed, patient examined, no change in status, stable for surgery.  I have reviewed the patient's chart and labs.  Questions were answered to the patient's satisfaction.     [place] C.

## 2016-01-08 LAB — COMPREHENSIVE METABOLIC PANEL
ALK PHOS: 36 U/L — AB (ref 38–126)
ALT: 19 U/L (ref 14–54)
AST: 18 U/L (ref 15–41)
Albumin: 2.9 g/dL — ABNORMAL LOW (ref 3.5–5.0)
Anion gap: 11 (ref 5–15)
BUN: 10 mg/dL (ref 6–20)
CALCIUM: 7.8 mg/dL — AB (ref 8.9–10.3)
CO2: 18 mmol/L — AB (ref 22–32)
CREATININE: 0.59 mg/dL (ref 0.44–1.00)
Chloride: 109 mmol/L (ref 101–111)
Glucose, Bld: 144 mg/dL — ABNORMAL HIGH (ref 65–99)
Potassium: 3.1 mmol/L — ABNORMAL LOW (ref 3.5–5.1)
SODIUM: 138 mmol/L (ref 135–145)
Total Bilirubin: 1.2 mg/dL (ref 0.3–1.2)
Total Protein: 4.9 g/dL — ABNORMAL LOW (ref 6.5–8.1)

## 2016-01-08 LAB — PREALBUMIN: Prealbumin: 11.1 mg/dL — ABNORMAL LOW (ref 18–38)

## 2016-01-08 LAB — GLUCOSE, CAPILLARY
GLUCOSE-CAPILLARY: 156 mg/dL — AB (ref 65–99)
Glucose-Capillary: 164 mg/dL — ABNORMAL HIGH (ref 65–99)

## 2016-01-08 LAB — CBC
HEMATOCRIT: 37.6 % (ref 36.0–46.0)
Hemoglobin: 12.5 g/dL (ref 12.0–15.0)
MCH: 35.8 pg — AB (ref 26.0–34.0)
MCHC: 33.2 g/dL (ref 30.0–36.0)
MCV: 107.7 fL — AB (ref 78.0–100.0)
PLATELETS: 149 10*3/uL — AB (ref 150–400)
RBC: 3.49 MIL/uL — AB (ref 3.87–5.11)
RDW: 14.7 % (ref 11.5–15.5)
WBC: 5.5 10*3/uL (ref 4.0–10.5)

## 2016-01-08 LAB — DIFFERENTIAL
Basophils Absolute: 0 10*3/uL (ref 0.0–0.1)
Basophils Relative: 1 %
Eosinophils Absolute: 0.2 10*3/uL (ref 0.0–0.7)
Eosinophils Relative: 4 %
Lymphocytes Relative: 21 %
Lymphs Abs: 1.1 10*3/uL (ref 0.7–4.0)
Monocytes Absolute: 0.7 10*3/uL (ref 0.1–1.0)
Monocytes Relative: 13 %
Neutro Abs: 3.4 10*3/uL (ref 1.7–7.7)
Neutrophils Relative %: 61 %

## 2016-01-08 LAB — PHOSPHORUS: PHOSPHORUS: 2.9 mg/dL (ref 2.5–4.6)

## 2016-01-08 LAB — TRIGLYCERIDES: Triglycerides: 93 mg/dL (ref <150)

## 2016-01-08 LAB — MAGNESIUM: Magnesium: 2.1 mg/dL (ref 1.7–2.4)

## 2016-01-08 MED ORDER — PANTOPRAZOLE SODIUM 40 MG IV SOLR
40.0000 mg | Freq: Two times a day (BID) | INTRAVENOUS | Status: DC
  Administered 2016-01-08 – 2016-01-13 (×11): 40 mg via INTRAVENOUS
  Filled 2016-01-08 (×13): qty 40

## 2016-01-08 MED ORDER — FAT EMULSION 20 % IV EMUL
240.0000 mL | INTRAVENOUS | Status: AC
  Administered 2016-01-08: 240 mL via INTRAVENOUS
  Filled 2016-01-08: qty 250

## 2016-01-08 MED ORDER — INSULIN ASPART 100 UNIT/ML ~~LOC~~ SOLN
0.0000 [IU] | Freq: Four times a day (QID) | SUBCUTANEOUS | Status: DC
  Administered 2016-01-08 – 2016-01-09 (×3): 2 [IU] via SUBCUTANEOUS
  Administered 2016-01-09 – 2016-01-10 (×2): 1 [IU] via SUBCUTANEOUS
  Administered 2016-01-10: 2 [IU] via SUBCUTANEOUS
  Administered 2016-01-10 – 2016-01-11 (×4): 1 [IU] via SUBCUTANEOUS
  Administered 2016-01-12: 2 [IU] via SUBCUTANEOUS

## 2016-01-08 MED ORDER — POTASSIUM CHLORIDE 10 MEQ/100ML IV SOLN
10.0000 meq | INTRAVENOUS | Status: AC
  Administered 2016-01-08 (×4): 10 meq via INTRAVENOUS
  Filled 2016-01-08 (×4): qty 100

## 2016-01-08 MED ORDER — TRACE MINERALS CR-CU-MN-SE-ZN 10-1000-500-60 MCG/ML IV SOLN
INTRAVENOUS | Status: AC
  Administered 2016-01-08: 17:00:00 via INTRAVENOUS
  Filled 2016-01-08: qty 960

## 2016-01-08 NOTE — Progress Notes (Signed)
Patient ID: Angela [name], female   DOB: 1936/06/26, 80 y.o.   MRN: [id number] [facility] Gastroenterology Progress Note  Angela [name] 80 y.o. [birth year]   Subjective: Feels ok. Large amount of NG drainage yesterday (1100 cc). Reports having abdominal pain when suction canister filled up. She is very concerned about the source of the gastric outlet obstruction.  Objective: Vital signs in last 24 hours: Filed Vitals:   01/08/16 0558 01/08/16 1429  BP: 117/61 116/70  Pulse: 60 57  Temp: 97.7 F (36.5 C) 98.1 F (36.7 C)  Resp: 18 18    Physical Exam: Gen: elderly, frail, alert, no acute distress HEENT: anicteric sclera CV: RRR Chest: CTA B Abd: soft, nontender, nondistended, +BS  Lab Results:  Recent Labs  01/07/16 0627 01/08/16 0526  NA 139 138  K 4.0 3.1*  CL 107 109  CO2 21* 18*  GLUCOSE 76 144*  BUN 12 10  CREATININE 0.63 0.59  CALCIUM 8.2* 7.8*  MG  --  2.1  PHOS  --  2.9    Recent Labs  01/07/16 0627 01/08/16 0526  AST 22 18  ALT 24 19  ALKPHOS 38 36*  BILITOT 1.2 1.2  PROT 5.0* 4.9*  ALBUMIN 3.1* 2.9*    Recent Labs  01/06/16 1356 01/08/16 0526  WBC 6.0 5.5  NEUTROABS  --  3.4  HGB 14.0 12.5  HCT 41.5 37.6  MCV 105.6* 107.7*  PLT 186 149*   No results for input(s): LABPROT, INR in the last 72 hours.    Assessment/Plan: Gastric Outlet obstruction - question malignancy vs. Functional obstruction from pyloric channel ulcer. S/P EGD yesterday and biopsies pending (ulcerated polypoid lesion in pyloric channel). Biopsies pending. Continue NG suction. Agree with TNA. Surgery consult when path results are complete because will likely need a palliative G-J. Dr. Hayes/Buccini to f/u tomorrow.   [place] C. 01/08/2016, 5:06 PM  Pager [phone]  If no answer or after 5 PM call [phone]

## 2016-01-08 NOTE — Progress Notes (Signed)
[facility] NOTE  Pharmacy Consult for TPN Indication: Gastric outlet obstruction  Allergies  Allergen Reactions  . Peanut-Containing Drug Products Anaphylaxis  . Reglan [Metoclopramide] Other (See Comments)    "Messed my mind up"  . Codeine Nausea Only  . Iohexol Swelling     Desc: pt received hypaque 60% in 1994 and had erythema, hives & lips swell.  She did ok w/o premeds in 8/05 at [facility].  Pt. takes daily prednisone for rheumatoid arthritis.  Poor venous access today (7/7/6) so we did her w/o iv contrast.   . Pineapple Swelling  . Shellfish Allergy Swelling    Lips swell   Patient Measurements: Height: 5\' 2"  (157.5 cm) Weight: 117 lb (53.071 kg) IBW/kg (Calculated) : 50.1 Adjusted Body Weight:  Usual Weight:   Vital Signs: Temp: 97.7 F (36.5 C) (03/05 0558) Temp Source: Oral (03/05 0558) BP: 117/61 mmHg (03/05 0558) Pulse Rate: 60 (03/05 0558) Intake/Output from previous day: 03/04 0701 - 03/05 0700 In: 1138.4 [I.V.:865.1; TPN:273.3] Out: 2250 [Urine:1150; Emesis/NG output:1100] Intake/Output from this shift: Total I/O In: 0  Out: 100 [Urine:100]  Labs:  Recent Labs  01/06/16 1356 01/08/16 0526  WBC 6.0 5.5  HGB 14.0 12.5  HCT 41.5 37.6  PLT 186 149*    Recent Labs  01/06/16 1356 01/07/16 0627 01/08/16 0525 01/08/16 0526  NA 139 139  --  138  K 4.4 4.0  --  3.1*  CL 100* 107  --  109  CO2 25 21*  --  18*  GLUCOSE 101* 76  --  144*  BUN 16 12  --  10  CREATININE 0.72 0.63  --  0.59  CALCIUM 9.5 8.2*  --  7.8*  MG  --   --   --  2.1  PHOS  --   --   --  2.9  PROT 6.5 5.0*  --  4.9*  ALBUMIN 4.2 3.1*  --  2.9*  AST 29 22  --  18  ALT 30 24  --  19  ALKPHOS 53 38  --  36*  BILITOT 1.1 1.2  --  1.2  PREALBUMIN  --   --   --  11.1*  TRIG  --   --  93  --    Estimated Creatinine Clearance: 46.6 mL/min (by C-G formula based on Cr of 0.59).   No results for input(s): GLUCAP in the last 72 hours.  Medical History: Past  Medical History  Diagnosis Date  . Cervical stenosis of spine   . Spondylolisthesis of lumbar region   . A-fib ([place])   . Asthma   . Depression   . Dyslipidemia   . GERD (gastroesophageal reflux disease)   . Obesity   . PVC's (premature ventricular contractions)   . PUD (peptic ulcer disease)     can not take aspirin  . Osteoporosis     alendronate since 2012  . Macular degeneration   . Aortic stenosis     mild by echo 03/2012  . Hypertension   . CAD (coronary artery disease)     s/p PCI with BMS to mid LAD--2/08 not on aspirin due to frequent ulcers.  Repeat cath for CP 09/2012 with patent LAD stent and otherwise normal coronary arteries  . Elbow fracture, right     history of- never any surgery  . Rheumatoid arthritis(714.0)     Dr. Lenna Gilford follows  . Impaired physical mobility     due to rheumatoid arthritis "ambulates  with walker" limited free standing.   Medications:  Scheduled:  . fluticasone  1 spray Each Nare Daily  . heparin  5,000 Units Subcutaneous 3 times per day  . LORazepam  2 mg Oral QHS  . methylPREDNISolone (SOLU-MEDROL) injection  20 mg Intravenous Daily  . nystatin cream   Topical BID  . pantoprazole (PROTONIX) IV  40 mg Intravenous Q12H  . potassium chloride  10 mEq Intravenous Q1 Hr x 4  . sodium chloride flush  10-40 mL Intracatheter Q12H   Infusions:  . sodium chloride 35 mL/hr at 01/08/16 0117  . Marland KitchenTPN (CLINIMIX-E) Adult 40 mL/hr at 01/08/16 0031   And  . fat emulsion 240 mL (01/08/16 0036)   Insulin Requirements: none ordered  Current Nutrition: strict NPO  IVF: NS at 35 ml/hr  Central access: 3/4 TPN start date:   ASSESSMENT                                                                                                          HPI: 86 yoF with recurrent abd pain since June 2016. Complaint of severe nausea, heartburn, loose stools, 20 lb weight loss in 3 months.  EGD 12/16 with large amount of retained food.  EGD 3/4: Gastric Outlet  Obstruction with polypoid lesion, ulcer,edema, gastritis. Possible gastrojejunostomy, await pathology report.  Significant events:   Today:   Glucose - serum glucose 144  Electrolytes - K 3.1, Corr Ca 8.6  Renal - wnl  LFTs - wnl  TGs - 93 (3/5)  Prealbumin - 11.1 (3/5 on steroids, will artificially elevate level)  NUTRITIONAL GOALS                                                                                             RD recs: await recommendations Clinimix / at a goal rate of 80 ml/hr + 20% fat emulsion at 10 ml/hr to provide: 96g/day protein, 1850 Kcal/day.  PLAN                                                                                                                         At 1800 today:  Continue Clinimix E 5/15 at  40 ml/hr.  20% fat emulsion at 10 ml/hr.  Plan to advance as tolerated to the goal rate when lytes stable  TPN to contain standard multivitamins and trace elements.  Reduced IVF to 35 ml/hr on 3/4  Add SSI Novolog sensitive scale q6hr  TPN lab panels on Mondays & Thursdays.  F/u daily.  Minda Ditto PharmD Pager [phone] 01/08/2016, 10:01 AM

## 2016-01-08 NOTE — Progress Notes (Signed)
Patient Demographics  Angela [name], is a 80 y.o. female, DOB - 1936/01/28, [id number]  Admit date - 01/06/2016   Admitting Physician Albertine Patricia, MD  Outpatient Primary MD for the patient is Irven Shelling, MD  LOS - 2   Chief Complaint  Patient presents with  . Abdominal Pain        Subjective:   Angela [name] today has, No headache, No chest pain, No abdominal pain -reports mild nausea , tolerating NG tube . Assessment & Plan    Active Problems:   Rheumatoid arthritis ([place])   Dyslipidemia   Essential hypertension, benign   Gastric outlet obstruction   Gastroparesis   gastric outlet obstruction  - Status post endoscopy , with evidence of gastric outlet obstruction with polypoid lesion in pyloric channel and surrounding ulcer and edema. - Follow on pathology results . - continue nothing by mouth per GI recommendation - Continue with TPN given patient's malnutrition and significant weight loss recently, and her nothing by mouth status, which might be prolonged, and possible need for surgery.   rheumatoid arthritis  - Patient is a steroid dependent, started on low dose IV Solu-Medrol  - Continue to hold methotrexate   Coronary artery disease - Denies any chest pain or shortness of breath,  -  will continue to hold Plavix as may need intervention.  History of PVC -Patient is nothing by mouth, will continue to hold beta blockers  Hyperlipidemia - Resume statin when stable  Diarrhea - Patient reports watery diarrhea, just finished her by mouth antibiotics secondary to tooth infection, follow C. difficile results   Code Status: Full  Family Communication: discussed with patient   Disposition Plan: pending further work up.   Procedures  EGD 01/07/2016    Consults   Gastroenterology    Medications  Scheduled Meds: . fluticasone  1 spray Each Nare Daily  .  heparin  5,000 Units Subcutaneous 3 times per day  . insulin aspart  0-9 Units Subcutaneous Q6H  . LORazepam  2 mg Oral QHS  . methylPREDNISolone (SOLU-MEDROL) injection  20 mg Intravenous Daily  . nystatin cream   Topical BID  . pantoprazole (PROTONIX) IV  40 mg Intravenous Q12H  . sodium chloride flush  10-40 mL Intracatheter Q12H   Continuous Infusions: . sodium chloride 35 mL/hr at 01/08/16 0117  . Marland KitchenTPN (CLINIMIX-E) Adult 40 mL/hr at 01/08/16 0031   And  . fat emulsion 240 mL (01/08/16 0036)  . Marland KitchenTPN (CLINIMIX-E) Adult     And  . fat emulsion     PRN Meds:.albuterol, LORazepam, menthol-cetylpyridinium, sodium chloride flush  DVT Prophylaxis   Heparin - SCDs   Lab Results  Component Value Date   PLT 149* 01/08/2016    Antibiotics    Anti-infectives    None          Objective:   Filed Vitals:   01/07/16 1416 01/07/16 2356 01/08/16 0558 01/08/16 1429  BP: 91/46 94/51 117/61 116/70  Pulse: 70 63 60 57  Temp: 97.8 F (36.6 C) 97.8 F (36.6 C) 97.7 F (36.5 C) 98.1 F (36.7 C)  TempSrc: Oral Oral Oral Oral  Resp: 18 19 18 18   Height:      Weight:  SpO2: 97% 97% 98% 96%    Wt Readings from Last 3 Encounters:  01/07/16 53.071 kg (117 lb)  12/07/15 55.792 kg (123 lb)  11/01/15 56.7 kg (125 lb)     Intake/Output Summary (Last 24 hours) at 01/08/16 1613 Last data filed at 01/08/16 0840  Gross per 24 hour  Intake 438.41 ml  Output   1500 ml  Net -1061.59 ml     Physical Exam  Awake Alert, Oriented X 3, No new F.N deficits, Normal affect [place].AT,PERRAL Supple Neck,No JVD, No cervical lymphadenopathy appriciated.  Symmetrical Chest wall movement, Good air movement bilaterally, CTAB RRR,No Gallops,Rubs or new Murmurs, No Parasternal Heave +ve B.Sounds, Abd Soft, No tenderness, No organomegaly appriciated, No rebound - guarding or rigidity. No Cyanosis, Clubbing or edema, No new Rash or bruise     Data Review   Micro Results No results found  for this or any previous visit (from the past 240 hour(s)).  Radiology Reports Dg Ugi  W/kub  01/06/2016  CLINICAL DATA:  Dysphagia EXAM: UPPER GI SERIES WITH KUB TECHNIQUE: After obtaining a scout radiograph a routine upper GI series was performed using thin and thick barium FLUOROSCOPY TIME:  Radiation Exposure Index (as provided by the fluoroscopic device): 111 d Gy cm2 If the device does not provide the exposure index: Fluoroscopy Time (in minutes and seconds): Number of Acquired Images: COMPARISON:  Barium swallow 07/13/2009 FINDINGS: Fluoroscopic evaluation of swallowing demonstrates no fixed stricture within the esophagus. There is mild generalized tapering of the distal third of the esophagus. No fold thickening. There is an elongated stomach. No gastric emptying can be visualized during the study. The patient was left on her right side to for 15-20 minutes. Despite this, the stomach never emptied. There is retained food material throughout the stomach. IMPRESSION: No emptying of the stomach despite waiting up to 20 minutes. Findings compatible with gastric outlet obstruction. Retained food material within the stomach. Electronically Signed   By: Rolm Baptise M.D.   On: 01/06/2016 11:39     CBC  Recent Labs Lab 01/06/16 1356 01/08/16 0526  WBC 6.0 5.5  HGB 14.0 12.5  HCT 41.5 37.6  PLT 186 149*  MCV 105.6* 107.7*  MCH 35.6* 35.8*  MCHC 33.7 33.2  RDW 14.2 14.7  LYMPHSABS  --  1.1  MONOABS  --  0.7  EOSABS  --  0.2  BASOSABS  --  0.0    Chemistries   Recent Labs Lab 01/06/16 1356 01/07/16 0627 01/08/16 0526  NA 139 139 138  K 4.4 4.0 3.1*  CL 100* 107 109  CO2 25 21* 18*  GLUCOSE 101* 76 144*  BUN 16 12 10   CREATININE 0.72 0.63 0.59  CALCIUM 9.5 8.2* 7.8*  MG  --   --  2.1  AST 29 22 18   ALT 30 24 19   ALKPHOS 53 38 36*  BILITOT 1.1 1.2 1.2    ------------------------------------------------------------------------------------------------------------------ estimated creatinine clearance is 46.6 mL/min (by C-G formula based on Cr of 0.59). ------------------------------------------------------------------------------------------------------------------ No results for input(s): HGBA1C in the last 72 hours. ------------------------------------------------------------------------------------------------------------------  Recent Labs  01/08/16 0525  TRIG 93   ------------------------------------------------------------------------------------------------------------------ No results for input(s): TSH, T4TOTAL, T3FREE, THYROIDAB in the last 72 hours.  Invalid input(s): FREET3 ------------------------------------------------------------------------------------------------------------------ No results for input(s): VITAMINB12, FOLATE, FERRITIN, TIBC, IRON, RETICCTPCT in the last 72 hours.  Coagulation profile No results for input(s): INR, PROTIME in the last 168 hours.  No results for input(s): DDIMER in the last 72 hours.  Cardiac Enzymes No results for input(s): CKMB, TROPONINI, MYOGLOBIN in the last 168 hours.  Invalid input(s): CK ------------------------------------------------------------------------------------------------------------------ Invalid input(s): POCBNP     Time Spent in minutes   20 minutes   Breonia Kirstein M.D on 01/08/2016 at 4:13 PM  Between 7am to 7pm - Pager - [phone]  After 7pm go to [email or web address] - password [facility]  [facility]   Office  [phone]

## 2016-01-09 ENCOUNTER — Ambulatory Visit: Payer: Medicare Other | Admitting: Physical Medicine & Rehabilitation

## 2016-01-09 ENCOUNTER — Encounter (HOSPITAL_COMMUNITY): Payer: Self-pay | Admitting: Gastroenterology

## 2016-01-09 LAB — CBC
HCT: 35.5 % — ABNORMAL LOW (ref 36.0–46.0)
HEMOGLOBIN: 12.1 g/dL (ref 12.0–15.0)
MCH: 36.3 pg — AB (ref 26.0–34.0)
MCHC: 34.1 g/dL (ref 30.0–36.0)
MCV: 106.6 fL — ABNORMAL HIGH (ref 78.0–100.0)
Platelets: 159 10*3/uL (ref 150–400)
RBC: 3.33 MIL/uL — AB (ref 3.87–5.11)
RDW: 14.5 % (ref 11.5–15.5)
WBC: 5.4 10*3/uL (ref 4.0–10.5)

## 2016-01-09 LAB — COMPREHENSIVE METABOLIC PANEL
ALBUMIN: 2.6 g/dL — AB (ref 3.5–5.0)
ALK PHOS: 33 U/L — AB (ref 38–126)
ALT: 17 U/L (ref 14–54)
ANION GAP: 4 — AB (ref 5–15)
AST: 17 U/L (ref 15–41)
BILIRUBIN TOTAL: 0.3 mg/dL (ref 0.3–1.2)
BUN: 9 mg/dL (ref 6–20)
CHLORIDE: 111 mmol/L (ref 101–111)
CO2: 23 mmol/L (ref 22–32)
Calcium: 8.1 mg/dL — ABNORMAL LOW (ref 8.9–10.3)
Creatinine, Ser: 0.39 mg/dL — ABNORMAL LOW (ref 0.44–1.00)
GFR calc Af Amer: >60 mL/min (ref >60)
GFR calc non Af Amer: >60 mL/min (ref >60)
Glucose, Bld: 146 mg/dL — ABNORMAL HIGH (ref 65–99)
POTASSIUM: 3.2 mmol/L — AB (ref 3.5–5.1)
Sodium: 138 mmol/L (ref 135–145)
Total Protein: 4.4 g/dL — ABNORMAL LOW (ref 6.5–8.1)

## 2016-01-09 LAB — DIFFERENTIAL
BASOS ABS: 0 10*3/uL (ref 0.0–0.1)
Basophils Relative: 0 %
Eosinophils Absolute: 0.1 10*3/uL (ref 0.0–0.7)
Eosinophils Relative: 2 %
LYMPHS ABS: 1.1 10*3/uL (ref 0.7–4.0)
LYMPHS PCT: 20 %
Monocytes Absolute: 0.8 10*3/uL (ref 0.1–1.0)
Monocytes Relative: 15 %
NEUTROS ABS: 3.4 10*3/uL (ref 1.7–7.7)
NEUTROS PCT: 63 %

## 2016-01-09 LAB — PHOSPHORUS: PHOSPHORUS: 2.1 mg/dL — AB (ref 2.5–4.6)

## 2016-01-09 LAB — GLUCOSE, CAPILLARY
GLUCOSE-CAPILLARY: 134 mg/dL — AB (ref 65–99)
GLUCOSE-CAPILLARY: 120 mg/dL — AB (ref 65–99)
Glucose-Capillary: 135 mg/dL — ABNORMAL HIGH (ref 65–99)
Glucose-Capillary: 174 mg/dL — ABNORMAL HIGH (ref 65–99)

## 2016-01-09 LAB — PREALBUMIN: PREALBUMIN: 11.4 mg/dL — AB (ref 18–38)

## 2016-01-09 LAB — MAGNESIUM: MAGNESIUM: 1.9 mg/dL (ref 1.7–2.4)

## 2016-01-09 LAB — TRIGLYCERIDES: TRIGLYCERIDES: 81 mg/dL (ref <150)

## 2016-01-09 MED ORDER — SODIUM CHLORIDE 0.9 % IV SOLN
INTRAVENOUS | Status: DC
  Administered 2016-01-09: 18:00:00 via INTRAVENOUS

## 2016-01-09 MED ORDER — FAT EMULSION 20 % IV EMUL
240.0000 mL | INTRAVENOUS | Status: AC
  Administered 2016-01-09: 240 mL via INTRAVENOUS
  Filled 2016-01-09: qty 240

## 2016-01-09 MED ORDER — MAGNESIUM SULFATE IN D5W 10-5 MG/ML-% IV SOLN
1.0000 g | Freq: Once | INTRAVENOUS | Status: AC
  Administered 2016-01-09: 1 g via INTRAVENOUS
  Filled 2016-01-09: qty 100

## 2016-01-09 MED ORDER — DIPHENHYDRAMINE HCL 50 MG/ML IJ SOLN
50.0000 mg | Freq: Once | INTRAMUSCULAR | Status: AC
  Administered 2016-01-10: 50 mg via INTRAVENOUS
  Filled 2016-01-09: qty 1

## 2016-01-09 MED ORDER — POTASSIUM PHOSPHATES 15 MMOLE/5ML IV SOLN
10.0000 mmol | Freq: Once | INTRAVENOUS | Status: DC
  Filled 2016-01-09: qty 3.33

## 2016-01-09 MED ORDER — METHYLPREDNISOLONE SODIUM SUCC 40 MG IJ SOLR
40.0000 mg | Freq: Four times a day (QID) | INTRAMUSCULAR | Status: AC
  Administered 2016-01-09 – 2016-01-10 (×3): 40 mg via INTRAVENOUS
  Filled 2016-01-09 (×3): qty 1

## 2016-01-09 MED ORDER — POTASSIUM CHLORIDE 10 MEQ/100ML IV SOLN
10.0000 meq | INTRAVENOUS | Status: AC
  Administered 2016-01-09 (×4): 10 meq via INTRAVENOUS
  Filled 2016-01-09 (×4): qty 100

## 2016-01-09 MED ORDER — TRACE MINERALS CR-CU-MN-SE-ZN 10-1000-500-60 MCG/ML IV SOLN
INTRAVENOUS | Status: AC
  Administered 2016-01-09: 17:00:00 via INTRAVENOUS
  Filled 2016-01-09: qty 1200

## 2016-01-09 MED ORDER — DEXTROSE 5 % IV SOLN
10.0000 mmol | Freq: Once | INTRAVENOUS | Status: AC
  Administered 2016-01-09: 10 mmol via INTRAVENOUS
  Filled 2016-01-09: qty 3.33

## 2016-01-09 NOTE — Clinical Documentation Improvement (Signed)
Internal Medicine  Patient's MAR notes Novolog SQ q6h; has history of gastroparesis. Based on the clinical findings, please document any associated diagnoses/conditions the patient has or may have.   Diabetes - type 1 or 2, with complications or not  Patient is not Diabetic; on Insulin for __________.  Other  Clinically Undetermined  Supporting Information:  Please exercise your independent, professional judgment when responding. A specific answer is not anticipated or expected.  Thank You, Zoila Shutter RN, BSN, [place] [phone]; Cell: [phone]

## 2016-01-09 NOTE — Care Management Important Message (Signed)
Important Message  Patient Details IM Letter given to Rhonda/Case Manager to present to Patient Name: GINNETTE BERARDINO MRN: [id number] Date of Birth: July 07, 1936   Medicare Important Message Given:  Yes    Camillo Flaming 01/09/2016, 4:42 [place] Message  Patient Details  Name: MATIANA MARTINCIC MRN: [id number] Date of Birth: 1936-09-28   Medicare Important Message Given:  Yes    Camillo Flaming 01/09/2016, 4:41 PM

## 2016-01-09 NOTE — Progress Notes (Signed)
Patient Demographics  Angela [name], is a 80 y.o. female, DOB - 1936/01/22, [id number]  Admit date - 01/06/2016   Admitting Physician Albertine Patricia, MD  Outpatient Primary MD for the patient is Irven Shelling, MD  LOS - 3   Chief Complaint  Patient presents with  . Abdominal Pain       Admission history of present illness/brief narrative: 80 y.o. female, with past medical history of coronary artery disease status post PCI, hypertension, hyperlipidemia, rheumatoid arthritis, peptic ulcer disease, aortic stenosis, sent by [facility] Dr. Wynetta Emery for gastric outlet obstruction, endoscopy 3/4 showing evidence of Gastric outlet obstruction near the pylorus, questionable mass versus ulcer versus scarring, biopsies pending, remains on NG tube with suction, started on TPN.  Subjective:   Angela [name] today has, No headache, No chest pain, No abdominal pain -reports mild nausea , tolerating NG tube . Assessment & Plan    Active Problems:   Rheumatoid arthritis ([place])   Dyslipidemia   Essential hypertension, benign   Gastric outlet obstruction   Gastroparesis   gastric outlet obstruction  - Status post endoscopy , with evidence of gastric outlet obstruction with polypoid lesion in pyloric channel and surrounding ulcer and edema, questionable ulcer with scarring versus mass. - Follow on pathology results . - continue nothing by mouth per GI recommendation, and tinea with NG tube on suction, remains with high output. - Continue with TPN given patient's malnutrition and significant weight loss recently, and her nothing by mouth status, which might be prolonged,. - Due to service consulted - CT abdomen and pelvis pending   rheumatoid arthritis  - Patient is a steroid dependent, started on low dose IV Solu-Medrol  - Continue to hold methotrexate   Coronary artery disease - Denies any chest pain  or shortness of breath,  -  will continue to hold Plavix as may need intervention.  History of PVC -Patient is nothing by mouth, will continue to hold beta blockers  Hyperlipidemia - Resume statin when stable  Diarrhea - Patient reports watery diarrhea, just finished her by mouth antibiotics secondary to tooth infection, follow C. difficile results  Severe protein calorie malnutrition -Continue with TPN, hypokalemia, hypophosphatemia repleted by pharmacy as part of TPN protocol  Code Status: Full  Family Communication: Family at bedside  Disposition Plan: pending further work up.   Procedures  EGD 01/07/2016    Consults   Gastroenterology    Medications  Scheduled Meds: . [START ON 01/10/2016] diphenhydrAMINE  50 mg Intravenous Once  . fluticasone  1 spray Each Nare Daily  . heparin  5,000 Units Subcutaneous 3 times per day  . insulin aspart  0-9 Units Subcutaneous Q6H  . LORazepam  2 mg Oral QHS  . methylPREDNISolone (SOLU-MEDROL) injection  20 mg Intravenous Daily  . methylPREDNISolone (SOLU-MEDROL) injection  40 mg Intravenous Q6H  . nystatin cream   Topical BID  . pantoprazole (PROTONIX) IV  40 mg Intravenous Q12H  . potassium chloride  10 mEq Intravenous Q1 Hr x 4  . potassium phosphate IVPB (mmol)  10 mmol Intravenous Once  . sodium chloride flush  10-40 mL Intracatheter Q12H   Continuous Infusions: . Marland KitchenTPN (CLINIMIX-E) Adult     And  . fat emulsion    .  sodium chloride 35 mL/hr at 01/09/16 1306  . sodium chloride    . Marland KitchenTPN (CLINIMIX-E) Adult 40 mL/hr at 01/08/16 1715   And  . fat emulsion 240 mL (01/08/16 1715)   PRN Meds:.albuterol, LORazepam, menthol-cetylpyridinium, sodium chloride flush  DVT Prophylaxis   Heparin - SCDs   Lab Results  Component Value Date   PLT 159 01/09/2016    Antibiotics    Anti-infectives    None          Objective:   Filed Vitals:   01/08/16 1429 01/08/16 2132 01/09/16 0637 01/09/16 0900  BP: 116/70 120/70  103/57 128/74  Pulse: 57 64 64 104  Temp: 98.1 F (36.7 C) 98.1 F (36.7 C) 97.4 F (36.3 C) 97.4 F (36.3 C)  TempSrc: Oral Oral Oral Oral  Resp: 18 18 17 19   Height:      Weight:      SpO2: 96% 96% 100% 95%    Wt Readings from Last 3 Encounters:  01/07/16 53.071 kg (117 lb)  12/07/15 55.792 kg (123 lb)  11/01/15 56.7 kg (125 lb)     Intake/Output Summary (Last 24 hours) at 01/09/16 1519 Last data filed at 01/09/16 0900  Gross per 24 hour  Intake      0 ml  Output   1125 ml  Net  -1125 ml     Physical Exam  Awake Alert, Oriented X 3, No new F.N deficits, Normal affect [place].AT,PERRAL Supple Neck,No JVD, No cervical lymphadenopathy appriciated.  Symmetrical Chest wall movement, Good air movement bilaterally, CTAB RRR,No Gallops,Rubs or new Murmurs, No Parasternal Heave +ve B.Sounds, Abd Soft, No tenderness, No organomegaly appriciated, No rebound - guarding or rigidity. No Cyanosis, Clubbing or edema, No new Rash or bruise     Data Review   Micro Results No results found for this or any previous visit (from the past 240 hour(s)).  Radiology Reports Dg Ugi  W/kub  01/06/2016  CLINICAL DATA:  Dysphagia EXAM: UPPER GI SERIES WITH KUB TECHNIQUE: After obtaining a scout radiograph a routine upper GI series was performed using thin and thick barium FLUOROSCOPY TIME:  Radiation Exposure Index (as provided by the fluoroscopic device): 111 d Gy cm2 If the device does not provide the exposure index: Fluoroscopy Time (in minutes and seconds): Number of Acquired Images: COMPARISON:  Barium swallow 07/13/2009 FINDINGS: Fluoroscopic evaluation of swallowing demonstrates no fixed stricture within the esophagus. There is mild generalized tapering of the distal third of the esophagus. No fold thickening. There is an elongated stomach. No gastric emptying can be visualized during the study. The patient was left on her right side to for 15-20 minutes. Despite this, the stomach never emptied.  There is retained food material throughout the stomach. IMPRESSION: No emptying of the stomach despite waiting up to 20 minutes. Findings compatible with gastric outlet obstruction. Retained food material within the stomach. Electronically Signed   By: Rolm Baptise M.D.   On: 01/06/2016 11:39     CBC  Recent Labs Lab 01/06/16 1356 01/08/16 0526 01/09/16 0515  WBC 6.0 5.5 5.4  HGB 14.0 12.5 12.1  HCT 41.5 37.6 35.5*  PLT 186 149* 159  MCV 105.6* 107.7* 106.6*  MCH 35.6* 35.8* 36.3*  MCHC 33.7 33.2 34.1  RDW 14.2 14.7 14.5  LYMPHSABS  --  1.1 1.1  MONOABS  --  0.7 0.8  EOSABS  --  0.2 0.1  BASOSABS  --  0.0 0.0    Chemistries   Recent Labs Lab  01/06/16 1356 01/07/16 0627 01/08/16 0526 01/09/16 0515  NA 139 139 138 138  K 4.4 4.0 3.1* 3.2*  CL 100* 107 109 111  CO2 25 21* 18* 23  GLUCOSE 101* 76 144* 146*  BUN 16 12 10 9   CREATININE 0.72 0.63 0.59 0.39*  CALCIUM 9.5 8.2* 7.8* 8.1*  MG  --   --  2.1 1.9  AST 29 22 18 17   ALT 30 24 19 17   ALKPHOS 53 38 36* 33*  BILITOT 1.1 1.2 1.2 0.3   ------------------------------------------------------------------------------------------------------------------ estimated creatinine clearance is 46.6 mL/min (by C-G formula based on Cr of 0.39). ------------------------------------------------------------------------------------------------------------------ No results for input(s): HGBA1C in the last 72 hours. ------------------------------------------------------------------------------------------------------------------  Recent Labs  01/08/16 0525 01/09/16 0515  TRIG 93 81   ------------------------------------------------------------------------------------------------------------------ No results for input(s): TSH, T4TOTAL, T3FREE, THYROIDAB in the last 72 hours.  Invalid input(s): FREET3 ------------------------------------------------------------------------------------------------------------------ No results for  input(s): VITAMINB12, FOLATE, FERRITIN, TIBC, IRON, RETICCTPCT in the last 72 hours.  Coagulation profile No results for input(s): INR, PROTIME in the last 168 hours.  No results for input(s): DDIMER in the last 72 hours.  Cardiac Enzymes No results for input(s): CKMB, TROPONINI, MYOGLOBIN in the last 168 hours.  Invalid input(s): CK ------------------------------------------------------------------------------------------------------------------ Invalid input(s): POCBNP     Time Spent in minutes   20 minutes   ELGERGAWY, DAWOOD M.D on 01/09/2016 at 3:19 PM  Between 7am to 7pm - Pager - [phone]  After 7pm go to [email or web address] - password [facility]  [facility]   Office  [phone]

## 2016-01-09 NOTE — Clinical Documentation Improvement (Signed)
Internal Medicine  Can the diagnosis of Atrial Fibrillation be further specified? Please document response in next progress note NOT in BPA drop down box. Thank you!   Chronic Atrial fibrillation  Paroxysmal Atrial fibrillation  Permanent Atrial fibrillation  Persistent Atrial fibrillation  Other  Clinically Undetermined  Document any associated diagnoses/conditions.  Supporting Information:  "On Plavix for Afib" seen in 3/3 Consult Note.  Please exercise your independent, professional judgment when responding. A specific answer is not anticipated or expected.  Thank You,  Zoila Shutter RN, BSN, [place] [phone]; Cell: [phone]

## 2016-01-09 NOTE — Clinical Documentation Improvement (Signed)
Internal Medicine  Can the diagnosis of Malnutrition be further specified? Please document response in next progress note NOT in BPA drop down box. Thank you!   Document Severity - Severe(third degree), Moderate (second degree), Mild (first degree)  Other condition  Unable to clinically determine  Document any associated diagnoses/conditions  Supporting Information: :   Patient is 5'2" tall weighing 117 pounds with BMI of 21.  TPN initiated  Please exercise your independent, professional judgment when responding. A specific answer is not anticipated or expected.  Thank You, Zoila Shutter RN, BSN, [place] [phone]; Cell: [phone]

## 2016-01-09 NOTE — Progress Notes (Signed)
[place] NOTE  Pharmacy Consult for TPN Indication: Gastric outlet obstruction  Allergies  Allergen Reactions  . Peanut-Containing Drug Products Anaphylaxis  . Reglan [Metoclopramide] Other (See Comments)    "Messed my mind up"  . Codeine Nausea Only  . Iohexol Swelling     Desc: pt received hypaque 60% in 1994 and had erythema, hives & lips swell.  She did ok w/o premeds in 8/05 at [facility].  Pt. takes daily prednisone for rheumatoid arthritis.  Poor venous access today (7/7/6) so we did her w/o iv contrast.   . Pineapple Swelling  . Shellfish Allergy Swelling    Lips swell   Patient Measurements: Height: 5\' 2"  (157.5 cm) Weight: 117 lb (53.071 kg) IBW/kg (Calculated) : 50.1 Adjusted Body Weight:  Usual Weight:   Vital Signs: Temp: 97.4 F (36.3 C) (03/06 0637) Temp Source: Oral (03/06 [id number]) BP: 103/57 mmHg (03/06 [id number]) Pulse Rate: 64 (03/06 0637) Intake/Output from previous day: 03/05 0701 - 03/06 0700 In: 315 [I.V.:315] Out: 1250 [Urine:800; Emesis/NG output:450] Intake/Output from this shift:    Labs:  Recent Labs  01/06/16 1356 01/08/16 0526 01/09/16 0515  WBC 6.0 5.5 5.4  HGB 14.0 12.5 12.1  HCT 41.5 37.6 35.5*  PLT 186 149* 159    Recent Labs  01/07/16 0627 01/08/16 0525 01/08/16 0526 01/09/16 0515  NA 139  --  138 138  K 4.0  --  3.1* 3.2*  CL 107  --  109 111  CO2 21*  --  18* 23  GLUCOSE 76  --  144* 146*  BUN 12  --  10 9  CREATININE 0.63  --  0.59 0.39*  CALCIUM 8.2*  --  7.8* 8.1*  MG  --   --  2.1 1.9  PHOS  --   --  2.9 2.1*  PROT 5.0*  --  4.9* 4.4*  ALBUMIN 3.1*  --  2.9* 2.6*  AST 22  --  18 17  ALT 24  --  19 17  ALKPHOS 38  --  36* 33*  BILITOT 1.2  --  1.2 0.3  PREALBUMIN  --   --  11.1*  --   TRIG  --  93  --   --    Estimated Creatinine Clearance: 46.6 mL/min (by C-G formula based on Cr of 0.39).    Recent Labs  01/08/16 1759 01/09/16 0003 01/09/16 0616  GLUCAP 164* 174* 135*    Medical  History: Past Medical History  Diagnosis Date  . Cervical stenosis of spine   . Spondylolisthesis of lumbar region   . A-fib ([place])   . Asthma   . Depression   . Dyslipidemia   . GERD (gastroesophageal reflux disease)   . Obesity   . PVC's (premature ventricular contractions)   . PUD (peptic ulcer disease)     can not take aspirin  . Osteoporosis     alendronate since 2012  . Macular degeneration   . Aortic stenosis     mild by echo 03/2012  . Hypertension   . CAD (coronary artery disease)     s/p PCI with BMS to mid LAD--2/08 not on aspirin due to frequent ulcers.  Repeat cath for CP 09/2012 with patent LAD stent and otherwise normal coronary arteries  . Elbow fracture, right     history of- never any surgery  . Rheumatoid arthritis(714.0)     Dr. Lenna Gilford follows  . Impaired physical mobility     due to rheumatoid  arthritis "ambulates with walker" limited free standing.   Medications:  Scheduled:  . fluticasone  1 spray Each Nare Daily  . heparin  5,000 Units Subcutaneous 3 times per day  . insulin aspart  0-9 Units Subcutaneous Q6H  . LORazepam  2 mg Oral QHS  . methylPREDNISolone (SOLU-MEDROL) injection  20 mg Intravenous Daily  . nystatin cream   Topical BID  . pantoprazole (PROTONIX) IV  40 mg Intravenous Q12H  . sodium chloride flush  10-40 mL Intracatheter Q12H   Infusions:  . sodium chloride 35 mL/hr at 01/08/16 0117  . Marland KitchenTPN (CLINIMIX-E) Adult 40 mL/hr at 01/08/16 1715   And  . fat emulsion 240 mL (01/08/16 1715)   Insulin Requirements: sensitive SSI q6h (7 units over the past 24 hrs) - on solu-medrol  Current Nutrition: NPO  IVF: NS at 35 ml/hr  Central access: 3/4 TPN start date: 3/5  ASSESSMENT                                                                                                          HPI: 27 yoF with recurrent abd pain since June 2016. Complaint of severe nausea, heartburn, loose stools, 20 lb weight loss in 3 months.  EGD 12/16 with large  amount of retained food.  EGD 3/4: Gastric Outlet Obstruction with polypoid lesion, ulcer,edema, gastritis. Possible gastrojejunostomy, await pathology report.  Significant events:  3/4: EGD with biopsy of ulcerated polypoid lesions  Today:   Glucose (goal <150): 135-174 (on solu-medrol)  Electrolytes:  K 3.2, phos 2.1, Mag 1.9; Corr Ca wnl  Renal: scr low  LFTs: wnl  TGs: 93 (3/5)  Prealbumin - 11.1 (3/5 on steroids, will artificially elevate level)  NUTRITIONAL GOALS                                                                                             RD recs:  Kcal: 1350-1550 Protein: 65-75 grams Fluid: 2 L/day  Clinimix / at a goal rate of 60 ml/hr + 20% fat emulsion at 10 ml/hr to provide: 72 g/day protein, 1502 Kcal/day (Per RD recom.)  PLAN                                                                                                                         -  Magnesium sulfate 1gm IV x1 - potassium phosphate 10 mmol IV x1 - potassium chloride 10 mEq IV x4 runs - At 1800 today:  Continue Clinimix E 5/15 at 50 ml/hr.  20% fat emulsion at 10 ml/hr.  Plan to advance as tolerated  TPN to contain standard multivitamins and trace elements.  Reduced IVF to [facility]  continue SSI Novolog sensitive scale q6hr  TPN lab panels on Mondays & Thursdays.  F/u daily.  Dia Sitter, PharmD, BCPS 01/09/2016 7:29 AM

## 2016-01-09 NOTE — Progress Notes (Signed)
[facility] Progress Note  Subjective: Patient in fairly good spirits abdominal discomfort improved. Eager to address her gastric outlet obstruction by surgery if needed.  Objective: Vital signs in last 24 hours: Temp:  [97.4 F (36.3 C)-98.1 F (36.7 C)] 97.4 F (36.3 C) (03/06 0900) Pulse Rate:  [57-104] 104 (03/06 0900) Resp:  [17-19] 19 (03/06 0900) BP: (103-128)/(57-74) 128/74 mmHg (03/06 0900) SpO2:  [95 %-100 %] 95 % (03/06 0900) Weight change:    PE: Unchanged  Lab Results: Results for orders placed or performed during the hospital encounter of 01/06/16 (from the past 24 hour(s))  Glucose, capillary     Status: Abnormal   Collection Time: 01/08/16 12:42 PM  Result Value Ref Range   Glucose-Capillary 156 (H) 65 - 99 mg/dL   Comment 1 Notify RN   Glucose, capillary     Status: Abnormal   Collection Time: 01/08/16  5:59 PM  Result Value Ref Range   Glucose-Capillary 164 (H) 65 - 99 mg/dL   Comment 1 Notify RN   Glucose, capillary     Status: Abnormal   Collection Time: 01/09/16 12:03 AM  Result Value Ref Range   Glucose-Capillary 174 (H) 65 - 99 mg/dL  Comprehensive metabolic panel     Status: Abnormal   Collection Time: 01/09/16  5:15 AM  Result Value Ref Range   Sodium 138 135 - 145 mmol/L   Potassium 3.2 (L) 3.5 - 5.1 mmol/L   Chloride 111 101 - 111 mmol/L   CO2 23 22 - 32 mmol/L   Glucose, Bld 146 (H) 65 - 99 mg/dL   BUN 9 6 - 20 mg/dL   Creatinine, Ser 0.39 (L) 0.44 - 1.00 mg/dL   Calcium 8.1 (L) 8.9 - 10.3 mg/dL   Total Protein 4.4 (L) 6.5 - 8.1 g/dL   Albumin 2.6 (L) 3.5 - 5.0 g/dL   AST 17 15 - 41 U/L   ALT 17 14 - 54 U/L   Alkaline Phosphatase 33 (L) 38 - 126 U/L   Total Bilirubin 0.3 0.3 - 1.2 mg/dL   GFR calc non Af Amer >60 >60 mL/min   GFR calc Af Amer >60 >60 mL/min   Anion gap 4 (L) 5 - 15  Magnesium     Status: None   Collection Time: 01/09/16  5:15 AM  Result Value Ref Range   Magnesium 1.9 1.7 - 2.4 mg/dL  Phosphorus      Status: Abnormal   Collection Time: 01/09/16  5:15 AM  Result Value Ref Range   Phosphorus 2.1 (L) 2.5 - 4.6 mg/dL  CBC     Status: Abnormal   Collection Time: 01/09/16  5:15 AM  Result Value Ref Range   WBC 5.4 4.0 - 10.5 K/uL   RBC 3.33 (L) 3.87 - 5.11 MIL/uL   Hemoglobin 12.1 12.0 - 15.0 g/dL   HCT 35.5 (L) 36.0 - 46.0 %   MCV 106.6 (H) 78.0 - 100.0 fL   MCH 36.3 (H) 26.0 - 34.0 pg   MCHC 34.1 30.0 - 36.0 g/dL   RDW 14.5 11.5 - 15.5 %   Platelets 159 150 - 400 K/uL  Differential     Status: None   Collection Time: 01/09/16  5:15 AM  Result Value Ref Range   Neutrophils Relative % 63 %   Neutro Abs 3.4 1.7 - 7.7 K/uL   Lymphocytes Relative 20 %   Lymphs Abs 1.1 0.7 - 4.0 K/uL   Monocytes Relative 15 %  Monocytes Absolute 0.8 0.1 - 1.0 K/uL   Eosinophils Relative 2 %   Eosinophils Absolute 0.1 0.0 - 0.7 K/uL   Basophils Relative 0 %   Basophils Absolute 0.0 0.0 - 0.1 K/uL  Triglycerides     Status: None   Collection Time: 01/09/16  5:15 AM  Result Value Ref Range   Triglycerides 81 <150 mg/dL  Prealbumin     Status: Abnormal   Collection Time: 01/09/16  5:15 AM  Result Value Ref Range   Prealbumin 11.4 (L) 18 - 38 mg/dL  Glucose, capillary     Status: Abnormal   Collection Time: 01/09/16  6:16 AM  Result Value Ref Range   Glucose-Capillary 135 (H) 65 - 99 mg/dL  Glucose, capillary     Status: Abnormal   Collection Time: 01/09/16  7:39 AM  Result Value Ref Range   Glucose-Capillary 134 (H) 65 - 99 mg/dL    Studies/Results: No results found.    Assessment: Gastric outlet obstruction near the pylorus, questionable mass versus ulcer versus scarring, biopsies pending.  Plan: Continue NG suction Await path Continue TPN Obtain abdominal CT scan.    Angela [name] C 01/09/2016, 10:38 AM  Pager [phone] If no answer or after 5 PM call [phone]

## 2016-01-09 NOTE — Progress Notes (Signed)
Initial Nutrition Assessment  DOCUMENTATION CODES:   Severe malnutrition in context of acute illness/injury  INTERVENTION:  - Continue TPN per pharmacy - If G-J tube placed, will provide TF recommendations should they be needed moving forward - RD will continue to monitor for additional needs  NUTRITION DIAGNOSIS:   Inadequate oral intake related to inability to eat as evidenced by NPO status.  GOAL:   Patient will meet greater than or equal to 90% of their needs  MONITOR:   Weight trends, Labs, Skin, I & O's, Other (Comment) (TPN regimen)  REASON FOR ASSESSMENT:   Consult New TPN/TNA  ASSESSMENT:   80 y.o. female, with past medical history of coronary artery disease status post PCI, hypertension, hyperlipidemia, rheumatoid arthritis, peptic ulcer disease, aortic stenosis, patient was sent by Dr. Norwood Levo GI for abnormal upper GI series, she reports she saw Dr. Wynetta Emery yesterday, ordered upper GI series for her, results were abnormal, where she had contrast in stomach, did not pass through, soshe was instructed to come to ED, she denies any abdominal pain, fever, chills, or vomiting, but she has complaints of nausea for few month, weight loss, and diarrhea over last few days as she's been receiving oral antibiotic for tooth infection, patient had EGD in December 20suspicious for severe gastroparesis(EGD incomplete as pylorus could not be located),   Pt seen for new TPN consult. BMI indicates normal weight. Pt receiving Clinimix E 5/15 @ 40 mL/hr with 20% lipids @ 10 mL/hr which is providing 48 grams of protein, 1162 kcal and this regimen was initiated on 01/07/16. Plan per pharmacy is for goal of Clinimix E 5/15 @ 80 mL/hr with 20% lipids @ 10 mL/hr which will provide 96 grams of protein, 1843 kcal. Current kcal and protein needs based on medical hx and current dx. Needs will be adjusted should malignancy be found. Recommend new goal of Clinimix E 5/15 @ 60 mL/hr with 20% lipids @  10 mL/hr which will provide 72 grams of protein, 1502 kcal.   Pt with NGT to suction with 500cc drainage present at time of RD visit. Pt had EGD 3/4 showing: Gastric Outlet Obstruction - polypoid lesion noted with surrounding ulcer and edema - s/p biopsies. Gastritis (distal stomach). Per GI note following procedure, pt may require gastrojejunostomy tube; RD will provide TF recommendations should this be placed and if pt to transition to TF from TPN.   Pt reports that last PO of solid food and liquids was Thursday (3/2) PM. She states that for the 5-6 months PTA she was progressively experiencing abdominal pain and nausea with PO intakes and during that time was also progressively unable to consume adequate intakes (intakes were less than usual). She states that in the past 2 months, related to this, she has lost 20 lbs. This would indicate 14.5% wt loss in this time frame which is significant. Chart review shows 6 lb weight loss (5% wt loss) in the past 1 month which is significant for time frame. Physical assessment shows moderate muscle and moderate fat wasting to upper and lower body.   Pt states relief in abdominal discomfort and nausea this AM. She also states she is feeling hungry and is looking forward to when she is able to consume PO. She states she has had a few ice chips today.   Pt not fully meeting needs with current TPN infusion rate. Medications reviewed. IVF: NS @ 35 mL/hr per pharmacy recommendation. Labs reviewed; CBGs: 134-174 mg/dL, Ca: 8.1 mg/dL, creatinine  low, Phos: 2.1 mg/dL.   Diet Order:  TPN (CLINIMIX-E) Adult Diet NPO time specified Except for: Ice Chips  Skin:  Wound (see comment) (MSAD to perineum)  Last BM:  PTA  Height:   Ht Readings from Last 1 Encounters:  01/07/16 5\' 2"  (1.575 m)    Weight:   Wt Readings from Last 1 Encounters:  01/07/16 117 lb (53.071 kg)    Ideal Body Weight:  50 kg (kg)  BMI:  Body mass index is 21.39 kg/(m^2).  Estimated  Nutritional Needs:   Kcal:  1350-1550  Protein:  65-75 grams  Fluid:  2 L/day  EDUCATION NEEDS:   No education needs identified at this time     Jarome Matin, RD, LDN Inpatient Clinical Dietitian Pager # [phone] After hours/weekend pager # [phone]

## 2016-01-09 NOTE — Consult Note (Signed)
[facility] Surgery Consult Note  Angela [name] 1936/05/19  [id number].    Requesting MD: Dr. Waldron Labs Chief Complaint/Reason for Consult: Gastic Outlet Obstruction HPI:  Angela [name] is a 80 y/o female with gradually worsening abdominal pain since June 2016, and especially since the holidays. Notes aching pain, distension and "hardening" of abdomen even with minimal PO intake. The pain is worst in the lower quadrants, 9.5/10, does not radiate, and is associated with severe nausea and minimal acid reflux. Denies any vomiting. Has not reported a BM since Thursday morning (01/05/16) where it was extremely loose and dark brown/black, but denies blood. Notes 2-3 lb weight loss in the past 2-3 days. Was taking abx for tooth infection.  PMHx significant for pylorus ulcer disease dx in June 2016 with which she was treated with Protonix and resumed normalcy with diet changes. RA x47 years, treated intermittently with methotrexate, regularly with prednisone x20 years. Afib, treated with Plavix. Caregiver at bedside helping with history.  Upper endoscopy completed on Friday (01/06/16), waiting for biopsy results of polyp. We were consulted for potential surgical intervention  ROS: All systems reviewed and otherwise negative except for as above Skin: bruises easily (Plavix) GU: incontinence, no changes in color/consistency MSK: joint pain in elbows, shoulders, neck   Family History  Problem Relation Age of Onset  . Lung cancer Mother   . Melanoma Brother   . Arrhythmia Mother   . Heart attack Neg Hx     Past Medical History  Diagnosis Date  . Cervical stenosis of spine   . Spondylolisthesis of lumbar region   . A-fib ([place])   . Asthma   . Depression   . Dyslipidemia   . GERD (gastroesophageal reflux disease)   . Obesity   . PVC's (premature ventricular contractions)   . PUD (peptic ulcer disease)     can not take aspirin  . Osteoporosis     alendronate since 2012  . Macular  degeneration   . Aortic stenosis     mild by echo 03/2012  . Hypertension   . CAD (coronary artery disease)     s/p PCI with BMS to mid LAD--2/08 not on aspirin due to frequent ulcers.  Repeat cath for CP 09/2012 with patent LAD stent and otherwise normal coronary arteries  . Elbow fracture, right     history of- never any surgery  . Rheumatoid arthritis(714.0)     Dr. Lenna Gilford follows  . Impaired physical mobility     due to rheumatoid arthritis "ambulates with walker" limited free standing.    Past Surgical History  Procedure Laterality Date  . Fracture surgery  09/2011    left ankle screw and pin removed  . Cardiac catheterization  09/2012    patent LAD stent and otherwise normal coronary arteries  . Cervical fusion      limited  ROM  . Tonsillectomy    . Cataract extraction, bilateral Bilateral   . Joint replacement Bilateral     BTKA  . Hand surgery Bilateral     x4 digits 2-5 both hands  . Ankle fusion Bilateral     x2 both  . Cholecystectomy    . Esophagogastroduodenoscopy (egd) with propofol N/A 11/01/2015    Procedure: ESOPHAGOGASTRODUODENOSCOPY (EGD) WITH PROPOFOL w/ Dialtation;  Surgeon: Garlan Fair, MD;  Location: [facility];  Service: Endoscopy;  Laterality: N/A;    Social History:  reports that she has never smoked. She has never used smokeless tobacco. She reports that she does  not drink alcohol or use illicit drugs.  Allergies:  Allergies  Allergen Reactions  . Peanut-Containing Drug Products Anaphylaxis  . Reglan [Metoclopramide] Other (See Comments)    "Messed my mind up"  . Codeine Nausea Only  . Iohexol Swelling     Desc: pt received hypaque 60% in 1994 and had erythema, hives & lips swell.  She did ok w/o premeds in 8/05 at [facility].  Pt. takes [name] prednisone for rheumatoid arthritis.  Poor venous access today (7/7/6) so we did her w/o iv contrast.   . Pineapple Swelling  . Shellfish Allergy Swelling    Lips swell    Medications Prior to  Admission  Medication Sig Dispense Refill  . acebutolol (SECTRAL) 200 MG capsule TAKE 1 CAPSULE (200 MG TOTAL) BY MOUTH 2 (TWO) TIMES [name]. 60 capsule 2  . Calcium Carbonate-Vitamin D (CALCIUM 500+D PO) Take 1 tablet by mouth 3 (three) times [name].    . clopidogrel (PLAVIX) 75 MG tablet TAKE 1 TABLET (75 MG TOTAL) BY MOUTH EVERY EVENING. 90 tablet 1  . diclofenac sodium (VOLTAREN) 1 % GEL Apply 2 g topically 2 (two) times [name] as needed. For pain (Patient taking differently: Apply 2 g topically 2 (two) times [name]. For pain) 3 Tube 5  . Docusate Calcium (STOOL SOFTENER PO) Take 1 tablet by mouth at bedtime.    . folic acid (FOLVITE) 1 MG tablet Take 1 mg by mouth [name].     . furosemide (LASIX) 40 MG tablet Take 1 tablet (40 mg total) by mouth [name]. You may take extra 1/2 tablet on days when you have extra swelling (Patient taking differently: Take 40 mg by mouth [name]. ) 45 tablet 6  . lidocaine (LIDODERM) 5 % APPLY 1 PATCH ONTO THE SKIN [name] REMOVE AND DISCARD PATCH WITHIN 12 HOURS OR AS DIRECTED BY PRESCRIBER (Patient taking differently: Place 1 patch onto the skin [name]. APPLY NECK AND ELBOW) 90 patch 5  . LORazepam (ATIVAN) 2 MG tablet Take 2 mg by mouth at bedtime.  5  . mometasone (NASONEX) 50 MCG/ACT nasal spray Place 2 sprays into the nose [name] as needed (nasal congestion).    . nystatin (MYCOSTATIN) 100000 UNIT/ML suspension Take 5 mLs by mouth 4 (four) times [name].    Marland Kitchen nystatin cream (MYCOSTATIN) Apply 1 application topically [name] as needed for dry skin.    Marland Kitchen oxyCODONE-acetaminophen (PERCOCET) 7.5-325 MG tablet Take 1 tablet by mouth every 6 (six) hours as needed. (Patient taking differently: Take 1 tablet by mouth every 6 (six) hours as needed for moderate pain. ) 120 tablet 0  . pantoprazole (PROTONIX) 40 MG tablet Take 40 mg by mouth 2 (two) times [name].     . predniSONE (DELTASONE) 5 MG tablet Take 5 mg by mouth [name].    Marland Kitchen PREVIDENT 0.2 % SOLN Take 1 each by mouth at bedtime.  Rinses with about 1 capful nightly    . simvastatin (ZOCOR) 40 MG tablet TAKE 1 TABLET BY MOUTH AT BEDTIME 90 tablet 0  . TOVIAZ 8 MG TB24 tablet Take 8 mg by mouth [name].    . butalbital-acetaminophen-caffeine (FIORICET, ESGIC) 50-325-40 MG per tablet Take 1 tablet by mouth every 6 (six) hours as needed for headache or migraine. For migraines    . EPIPEN 2-PAK 0.3 MG/0.3ML SOAJ Inject 0.3 mg into the muscle [name] as needed (for allergic reaction).     . fluticasone (FLONASE) 50 MCG/ACT nasal spray Place 1 spray into both nostrils [name] as needed  for allergies.   0  . methotrexate (RHEUMATREX) 2.5 MG tablet Take 15 mg by mouth once a week. On Saturdays    . NITROSTAT 0.4 MG SL tablet Place 0.4 mg under the tongue every 5 (five) minutes as needed for chest pain.       Blood pressure 128/74, pulse 104, temperature 97.4 F (36.3 C), temperature source Oral, resp. rate 19, height '5\' 2"'  (1.575 m), weight 53.071 kg (117 lb), SpO2 95 %. Physical Exam: General: pleasant, WD/WN white female who is laying in bed in NAD HEENT: head is normocephalic, atraumatic Heart: regular, rate, and rhythm.  No obvious murmurs, gallops, or rubs noted.  Palpable pedal pulses bilaterally Lungs: CTAB, no wheezes, rhonchi, or rales noted.  Respiratory effort nonlabored Abd: soft, nontender, mildly distended, few BS, no incisions, hernias, or masses noted MSK: all 4 extremities are symmetrical with no cyanosis or edema. Notes minimal left ankle pain. Skin: warm and dry with no masses, lesions, or rashes Psych: A&Ox3 with an appropriate affect.   Results for orders placed or performed during the hospital encounter of 01/06/16 (from the past 48 hour(s))  Triglycerides     Status: None   Collection Time: 01/08/16  5:25 AM  Result Value Ref Range   Triglycerides 93 <150 mg/dL    Comment: Performed at [facility]  Comprehensive metabolic panel     Status: Abnormal   Collection Time: 01/08/16  5:26 AM  Result  Value Ref Range   Sodium 138 135 - 145 mmol/L   Potassium 3.1 (L) 3.5 - 5.1 mmol/L    Comment: RESULT REPEATED AND VERIFIED DELTA CHECK NOTED    Chloride 109 101 - 111 mmol/L   CO2 18 (L) 22 - 32 mmol/L   Glucose, Bld 144 (H) 65 - 99 mg/dL   BUN 10 6 - 20 mg/dL   Creatinine, Ser 0.59 0.44 - 1.00 mg/dL   Calcium 7.8 (L) 8.9 - 10.3 mg/dL   Total Protein 4.9 (L) 6.5 - 8.1 g/dL   Albumin 2.9 (L) 3.5 - 5.0 g/dL   AST 18 15 - 41 U/L   ALT 19 14 - 54 U/L   Alkaline Phosphatase 36 (L) 38 - 126 U/L   Total Bilirubin 1.2 0.3 - 1.2 mg/dL   GFR calc non Af Amer >60 >60 mL/min   GFR calc Af Amer >60 >60 mL/min    Comment: (NOTE) The eGFR has been calculated using the CKD EPI equation. This calculation has not been validated in all clinical situations. eGFR's persistently <60 mL/min signify possible Chronic Kidney Disease.    Anion gap 11 5 - 15  Magnesium     Status: None   Collection Time: 01/08/16  5:26 AM  Result Value Ref Range   Magnesium 2.1 1.7 - 2.4 mg/dL  Phosphorus     Status: None   Collection Time: 01/08/16  5:26 AM  Result Value Ref Range   Phosphorus 2.9 2.5 - 4.6 mg/dL  Prealbumin     Status: Abnormal   Collection Time: 01/08/16  5:26 AM  Result Value Ref Range   Prealbumin 11.1 (L) 18 - 38 mg/dL    Comment: Performed at [facility]  CBC     Status: Abnormal   Collection Time: 01/08/16  5:26 AM  Result Value Ref Range   WBC 5.5 4.0 - 10.5 K/uL   RBC 3.49 (L) 3.87 - 5.11 MIL/uL   Hemoglobin 12.5 12.0 - 15.0 g/dL   HCT 37.6 36.0 -  46.0 %   MCV 107.7 (H) 78.0 - 100.0 fL   MCH 35.8 (H) 26.0 - 34.0 pg   MCHC 33.2 30.0 - 36.0 g/dL   RDW 14.7 11.5 - 15.5 %   Platelets 149 (L) 150 - 400 K/uL  Differential     Status: None   Collection Time: 01/08/16  5:26 AM  Result Value Ref Range   Neutrophils Relative % 61 %   Neutro Abs 3.4 1.7 - 7.7 K/uL   Lymphocytes Relative 21 %   Lymphs Abs 1.1 0.7 - 4.0 K/uL   Monocytes Relative 13 %   Monocytes Absolute 0.7 0.1  - 1.0 K/uL   Eosinophils Relative 4 %   Eosinophils Absolute 0.2 0.0 - 0.7 K/uL   Basophils Relative 1 %   Basophils Absolute 0.0 0.0 - 0.1 K/uL  Glucose, capillary     Status: Abnormal   Collection Time: 01/08/16 12:42 PM  Result Value Ref Range   Glucose-Capillary 156 (H) 65 - 99 mg/dL   Comment 1 Notify RN   Glucose, capillary     Status: Abnormal   Collection Time: 01/08/16  5:59 PM  Result Value Ref Range   Glucose-Capillary 164 (H) 65 - 99 mg/dL   Comment 1 Notify RN   Glucose, capillary     Status: Abnormal   Collection Time: 01/09/16 12:03 AM  Result Value Ref Range   Glucose-Capillary 174 (H) 65 - 99 mg/dL  Comprehensive metabolic panel     Status: Abnormal   Collection Time: 01/09/16  5:15 AM  Result Value Ref Range   Sodium 138 135 - 145 mmol/L   Potassium 3.2 (L) 3.5 - 5.1 mmol/L   Chloride 111 101 - 111 mmol/L   CO2 23 22 - 32 mmol/L   Glucose, Bld 146 (H) 65 - 99 mg/dL   BUN 9 6 - 20 mg/dL   Creatinine, Ser 0.39 (L) 0.44 - 1.00 mg/dL   Calcium 8.1 (L) 8.9 - 10.3 mg/dL   Total Protein 4.4 (L) 6.5 - 8.1 g/dL   Albumin 2.6 (L) 3.5 - 5.0 g/dL   AST 17 15 - 41 U/L   ALT 17 14 - 54 U/L   Alkaline Phosphatase 33 (L) 38 - 126 U/L   Total Bilirubin 0.3 0.3 - 1.2 mg/dL   GFR calc non Af Amer >60 >60 mL/min   GFR calc Af Amer >60 >60 mL/min    Comment: (NOTE) The eGFR has been calculated using the CKD EPI equation. This calculation has not been validated in all clinical situations. eGFR's persistently <60 mL/min signify possible Chronic Kidney Disease.    Anion gap 4 (L) 5 - 15  Magnesium     Status: None   Collection Time: 01/09/16  5:15 AM  Result Value Ref Range   Magnesium 1.9 1.7 - 2.4 mg/dL  Phosphorus     Status: Abnormal   Collection Time: 01/09/16  5:15 AM  Result Value Ref Range   Phosphorus 2.1 (L) 2.5 - 4.6 mg/dL  CBC     Status: Abnormal   Collection Time: 01/09/16  5:15 AM  Result Value Ref Range   WBC 5.4 4.0 - 10.5 K/uL   RBC 3.33 (L) 3.87 -  5.11 MIL/uL   Hemoglobin 12.1 12.0 - 15.0 g/dL   HCT 35.5 (L) 36.0 - 46.0 %   MCV 106.6 (H) 78.0 - 100.0 fL   MCH 36.3 (H) 26.0 - 34.0 pg   MCHC 34.1 30.0 - 36.0 g/dL  RDW 14.5 11.5 - 15.5 %   Platelets 159 150 - 400 K/uL  Differential     Status: None   Collection Time: 01/09/16  5:15 AM  Result Value Ref Range   Neutrophils Relative % 63 %   Neutro Abs 3.4 1.7 - 7.7 K/uL   Lymphocytes Relative 20 %   Lymphs Abs 1.1 0.7 - 4.0 K/uL   Monocytes Relative 15 %   Monocytes Absolute 0.8 0.1 - 1.0 K/uL   Eosinophils Relative 2 %   Eosinophils Absolute 0.1 0.0 - 0.7 K/uL   Basophils Relative 0 %   Basophils Absolute 0.0 0.0 - 0.1 K/uL  Triglycerides     Status: None   Collection Time: 01/09/16  5:15 AM  Result Value Ref Range   Triglycerides 81 <150 mg/dL    Comment: Performed at [facility]  Prealbumin     Status: Abnormal   Collection Time: 01/09/16  5:15 AM  Result Value Ref Range   Prealbumin 11.4 (L) 18 - 38 mg/dL    Comment: Performed at [facility]  Glucose, capillary     Status: Abnormal   Collection Time: 01/09/16  6:16 AM  Result Value Ref Range   Glucose-Capillary 135 (H) 65 - 99 mg/dL  Glucose, capillary     Status: Abnormal   Collection Time: 01/09/16  7:39 AM  Result Value Ref Range   Glucose-Capillary 134 (H) 65 - 99 mg/dL   No results found.    Assessment/Plan Gastic Outlet Obstruction/PUD 1.  Awaiting biopsy results before any decision for surgical intervention. May need cardiac clearance. 2.  NPO, bowel rest, IVF, antiemetics if necessary, continue NG tube 3.  Continue prednisone, continue hold of methotrexate for RA 4.  SCD's and IV heparin for DVT proph 5.  Ambulate and IS  Diarrhea 1. C diff pending  Rheumatoid Arthritis Coronary Artery Disease History of PVC Hyperlipidemia Urinary Incontinence  Delma Post, PA-S [facility] Surgery 01/09/2016, 10:37 AM Pager: [phone] (7am - 4:30pm M-F; 7am -  11:30am Sa/Su)

## 2016-01-10 ENCOUNTER — Encounter (HOSPITAL_COMMUNITY): Payer: Self-pay

## 2016-01-10 ENCOUNTER — Inpatient Hospital Stay (HOSPITAL_COMMUNITY): Payer: Medicare Other

## 2016-01-10 LAB — BASIC METABOLIC PANEL
Anion gap: 4 — ABNORMAL LOW (ref 5–15)
BUN: 12 mg/dL (ref 6–20)
CO2: 23 mmol/L (ref 22–32)
Calcium: 8 mg/dL — ABNORMAL LOW (ref 8.9–10.3)
Chloride: 107 mmol/L (ref 101–111)
Creatinine, Ser: 0.48 mg/dL (ref 0.44–1.00)
GFR calc Af Amer: >60 mL/min (ref >60)
GFR calc non Af Amer: >60 mL/min (ref >60)
Glucose, Bld: 203 mg/dL — ABNORMAL HIGH (ref 65–99)
Potassium: 4.3 mmol/L (ref 3.5–5.1)
Sodium: 134 mmol/L — ABNORMAL LOW (ref 135–145)

## 2016-01-10 LAB — C DIFFICILE QUICK SCREEN W PCR REFLEX
C DIFFICILE (CDIFF) INTERP: NEGATIVE
C DIFFICILE (CDIFF) TOXIN: NEGATIVE
C Diff antigen: NEGATIVE

## 2016-01-10 LAB — MAGNESIUM: Magnesium: 2.2 mg/dL (ref 1.7–2.4)

## 2016-01-10 LAB — GLUCOSE, CAPILLARY
GLUCOSE-CAPILLARY: 131 mg/dL — AB (ref 65–99)
GLUCOSE-CAPILLARY: 138 mg/dL — AB (ref 65–99)
GLUCOSE-CAPILLARY: 157 mg/dL — AB (ref 65–99)
GLUCOSE-CAPILLARY: 188 mg/dL — AB (ref 65–99)

## 2016-01-10 LAB — PHOSPHORUS: Phosphorus: 2.7 mg/dL (ref 2.5–4.6)

## 2016-01-10 MED ORDER — TRACE MINERALS CR-CU-MN-SE-ZN 10-1000-500-60 MCG/ML IV SOLN
INTRAVENOUS | Status: AC
  Administered 2016-01-10: 18:00:00 via INTRAVENOUS
  Filled 2016-01-10: qty 1440

## 2016-01-10 MED ORDER — FAT EMULSION 20 % IV EMUL
240.0000 mL | INTRAVENOUS | Status: AC
  Administered 2016-01-10: 240 mL via INTRAVENOUS
  Filled 2016-01-10: qty 250

## 2016-01-10 MED ORDER — IOHEXOL 300 MG/ML  SOLN
100.0000 mL | Freq: Once | INTRAMUSCULAR | Status: AC | PRN
  Administered 2016-01-10: 100 mL via INTRAVENOUS

## 2016-01-10 NOTE — Progress Notes (Signed)
3 Days Post-Op  Subjective: No complaints except she wants the results of Bx so we can move on.    Objective: Vital signs in last 24 hours: Temp:  [97.1 F (36.2 C)-98 F (36.7 C)] 97.5 F (36.4 C) (03/07 0610) Pulse Rate:  [67-77] 67 (03/07 0610) Resp:  [18] 18 (03/07 0610) BP: (93-130)/(59-73) 93/59 mmHg (03/07 0610) SpO2:  [96 %-99 %] 99 % (03/07 0610) Last BM Date: 01/05/16 NPO 25 from NG recorded  She remains NPO On TNA for nutrition   1 BM recorded Afebrile, VSS  Labs OK CT scan yesterday:  Nasogastric tube extends to the antrum of the stomach. Stomach is otherwise grossly unremarkable in appearance. Specifically, no definite gastric mass identified. Accurate assessment for ulcer disease is not routinely possible on CT examinations. Stomach does not appear distended. No pathologic dilatation of small bowel or colon. Normal appendix. Pathology is still pending  Intake/Output from previous day: 03/06 0701 - 03/07 0700 In: 910.7 [I.V.:133.7; TPN:777] Out: 775 [Urine:750; Emesis/NG output:25] Intake/Output this shift: Total I/O In: 0  Out: 151 [Urine:150; Stool:1]  General appearance: alert, cooperative, no distress and nothing coming fromt he NG GI: soft, non-tender; bowel sounds normal; no masses,  no organomegaly and NG was in right place last PM, what is in the cannister looks like ice chips.  Lab Results:   Recent Labs  01/08/16 0526 01/09/16 0515  WBC 5.5 5.4  HGB 12.5 12.1  HCT 37.6 35.5*  PLT 149* 159    BMET  Recent Labs  01/09/16 0515 01/10/16 0430  NA 138 134*  K 3.2* 4.3  CL 111 107  CO2 23 23  GLUCOSE 146* 203*  BUN 9 12  CREATININE 0.39* 0.48  CALCIUM 8.1* 8.0*   PT/INR No results for input(s): LABPROT, INR in the last 72 hours.   Recent Labs Lab 01/06/16 1356 01/07/16 0627 01/08/16 0526 01/09/16 0515  AST 29 22 18 17   ALT 30 24 19 17   ALKPHOS 53 38 36* 33*  BILITOT 1.1 1.2 1.2 0.3  PROT 6.5 5.0* 4.9* 4.4*  ALBUMIN 4.2  3.1* 2.9* 2.6*     Lipase     Component Value Date/Time   LIPASE 36 01/06/2016 1356     Studies/Results: Ct Abdomen Pelvis W Contrast  01/10/2016  CLINICAL DATA:  80 year old female with history of gastric outlet obstruction noted on endoscopy on 01/07/2016. Evaluate for potential mass versus ulcer. EXAM: CT ABDOMEN AND PELVIS WITH CONTRAST TECHNIQUE: Multidetector CT imaging of the abdomen and pelvis was performed using the standard protocol following bolus administration of intravenous contrast. CONTRAST:  150mL OMNIPAQUE IOHEXOL 300 MG/ML  SOLN COMPARISON:  CT the abdomen and pelvis 02/03/2009. FINDINGS: Lower chest: Scarring in left lower lobe. Cardiomegaly. Nasogastric tube in position. Hepatobiliary: No cystic or solid hepatic lesions. No intra or extrahepatic biliary ductal dilatation. Status post cholecystectomy. Pancreas: Mild fatty atrophy in the pancreas, most notably in the inferior aspect of the pancreatic head and pancreatic uncinate process. No pancreatic mass. No pancreatic ductal dilatation. No pancreatic or peripancreatic fluid or inflammatory changes. Spleen: Unremarkable. Adrenals/Urinary Tract: Mild diffuse cortical thinning in the kidneys bilaterally. Sub cm low-attenuation lesion in the lower pole of the left kidney is too small to definitively characterize, but is statistically likely a tiny cyst. The appearance of the right kidney is otherwise unremarkable. Bilateral adrenal glands are normal in appearance. No hydroureteronephrosis. Urinary bladder is normal in appearance. Stomach/Bowel: Nasogastric tube extends to the antrum of the stomach. Stomach  is otherwise grossly unremarkable in appearance. Specifically, no definite gastric mass identified. Accurate assessment for ulcer disease is not routinely possible on CT examinations. Stomach does not appear distended. No pathologic dilatation of small bowel or colon. Normal appendix. Vascular/Lymphatic: Atherosclerosis throughout the  abdominal and pelvic vasculature, without evidence of aneurysm or dissection. No lymphadenopathy noted in the abdomen or pelvis. Reproductive: Uterus and ovaries are atrophic. Other: No significant volume of ascites.  No pneumoperitoneum. Musculoskeletal: Multilevel degenerative disc disease in the lumbar spine, most severe at L3-L4 and L4-L5. There is 9 mm of anterolisthesis of L3 upon L4 and 6 mm of anterolisthesis of L4 upon L5. There are no aggressive appearing lytic or blastic lesions noted in the visualized portions of the skeleton. IMPRESSION: 1. No definite source for gastric outlet obstruction identified. Specifically, no definite gastric mass. 2. No acute findings in the abdomen or pelvis. 3. Extensive atherosclerosis. 4. Additional incidental findings, as above. Electronically Signed   By: Vinnie Langton M.D.   On: 01/10/2016 07:38    Medications: . fluticasone  1 spray Each Nare Daily  . heparin  5,000 Units Subcutaneous 3 times per day  . insulin aspart  0-9 Units Subcutaneous Q6H  . LORazepam  2 mg Oral QHS  . methylPREDNISolone (SOLU-MEDROL) injection  20 mg Intravenous Daily  . nystatin cream   Topical BID  . pantoprazole (PROTONIX) IV  40 mg Intravenous Q12H  . sodium chloride flush  10-40 mL Intracatheter Q12H    Assessment/Plan Gastric outlet obstruction s/p EGD 01/07/16 Dr. Michail Sermon Findings: Gastric Outlet Obstruction - polypoid lesion noted with surrounding ulcer and edema Rheumatoid Arthritis Coronary Artery Disease History of PVC Hyperlipidemia Urinary Incontinence Antibiotics:  None DVT:  Heparin On Solumedrol      Plan:  Await path.     LOS: 4 days    Sira Adsit 01/10/2016

## 2016-01-10 NOTE — Progress Notes (Signed)
Patient Demographics  Iver Abegg, is a 80 y.o. female, DOB - 04-09-1936, SN:[id number]  Admit date - 01/06/2016   Admitting Physician Albertine Patricia, MD  Outpatient Primary MD for the patient is Irven Shelling, MD  LOS - 4   Chief Complaint  Patient presents with  . Abdominal Pain       Admission history of present illness/brief narrative: 80 y.o. female, with past medical history of coronary artery disease status post PCI, hypertension, hyperlipidemia, rheumatoid arthritis, peptic ulcer disease, aortic stenosis, sent [facility] Dr. Wynetta Emery for gastric outlet obstruction, endoscopy 3/4 showing evidence of Gastric outlet obstruction near the pylorus, questionable mass versus ulcer versus scarring, biopsies pending, remains on NG tube with suction, started on TPN.  Subjective:   Avaline Bian today has, No headache, No chest pain, No abdominal pain -reports mild nausea , tolerating NG tube . Assessment & Plan    Active Problems:   Rheumatoid arthritis ([place])   Dyslipidemia   Essential hypertension, benign   Gastric outlet obstruction   Gastroparesis   gastric outlet obstruction  - Status post endoscopy , with evidence of gastric outlet obstruction with polypoid lesion in pyloric channel and surrounding ulcer and edema, questionable ulcer with scarring versus mass. -  pathology results significant for active duodenitis, gastropathy, no evidence of dysplasia, metaplasia or malignancy - continue nothing by mouth per GI recommendation, continue with NG tube on suction. - Continue with TPN given patient's malnutrition and significant weight loss recently, and her nothing by mouth status, which might be prolonged,. - CT abdomen and pelvis  with no evidence of masses or malignancy - Being followed closely by GI and general surgery, unclear if she will need surgery or not .   rheumatoid  arthritis  - Patient is a steroid dependent, started on low dose IV Solu-Medrol  - Continue to hold methotrexate   Coronary artery disease - Denies any chest pain or shortness of breath,  -  will continue to hold Plavix as may need intervention.  History of PVC -Patient is nothing by mouth, will continue to hold beta blockers  Hyperlipidemia - Resume statin when stable  Diarrhea - Patient reports watery diarrhea, just finished her by mouth antibiotics secondary to tooth infection, follow C. difficile results  Severe protein calorie malnutrition -Continue with TPN, hypokalemia, hypophosphatemia repleted by pharmacy as part of TPN protocol  Code Status: Full  Family Communication: Family at bedside  Disposition Plan: pending further work up.   Procedures  EGD 01/07/2016    Consults   Gastroenterology   general surgery   Medications  Scheduled Meds: . fluticasone  1 spray Each Nare Daily  . heparin  5,000 Units Subcutaneous 3 times per day  . insulin aspart  0-9 Units Subcutaneous Q6H  . LORazepam  2 mg Oral QHS  . methylPREDNISolone (SOLU-MEDROL) injection  20 mg Intravenous Daily  . nystatin cream   Topical BID  . pantoprazole (PROTONIX) IV  40 mg Intravenous Q12H  . sodium chloride flush  10-40 mL Intracatheter Q12H   Continuous Infusions: . Marland KitchenTPN (CLINIMIX-E) Adult 50 mL/hr at 01/09/16 1722   And  . fat emulsion 240 mL (01/09/16 1722)  . Marland KitchenTPN (CLINIMIX-E) Adult     And  .  fat emulsion    . sodium chloride 10 mL/hr at 01/09/16 1828   PRN Meds:.albuterol, LORazepam, menthol-cetylpyridinium, sodium chloride flush  DVT Prophylaxis   Heparin - SCDs   Lab Results  Component Value Date   PLT 159 01/09/2016    Antibiotics    Anti-infectives    None          Objective:   Filed Vitals:   01/09/16 1500 01/09/16 2143 01/10/16 0610 01/10/16 1500  BP: 130/71 115/73 93/59 138/72  Pulse: 77 71 67 65  Temp: 97.1 F (36.2 C) 98 F (36.7 C) 97.5 F (36.4  C) 98.3 F (36.8 C)  TempSrc: Axillary Oral Oral Oral  Resp: 18 18 18 18   Height:      Weight:      SpO2: 99% 96% 99% 100%    Wt Readings from Last 3 Encounters:  01/07/16 53.071 kg (117 lb)  12/07/15 55.792 kg (123 lb)  11/01/15 56.7 kg (125 lb)     Intake/Output Summary (Last 24 hours) at 01/10/16 1714 Last data filed at 01/10/16 0820  Gross per 24 hour  Intake 910.67 ml  Output    426 ml  Net 484.67 ml     Physical Exam  Awake Alert, Oriented X 3, No new F.N deficits, Normal affect [place].AT,PERRAL Supple Neck,No JVD, No cervical lymphadenopathy appriciated.  Symmetrical Chest wall movement, Good air movement bilaterally, CTAB RRR,No Gallops,Rubs or new Murmurs, No Parasternal Heave +ve B.Sounds, Abd Soft, No tenderness, No organomegaly appriciated, No rebound - guarding or rigidity. No Cyanosis, Clubbing or edema, No new Rash or bruise     Data Review   Micro Results Recent Results (from the past 240 hour(s))  C difficile quick scan w PCR reflex     Status: None   Collection Time: 01/10/16 12:01 AM  Result Value Ref Range Status   C Diff antigen NEGATIVE NEGATIVE Final   C Diff toxin NEGATIVE NEGATIVE Final   C Diff interpretation Negative for toxigenic C. difficile  Final    Radiology Reports Ct Abdomen Pelvis W Contrast  01/10/2016  CLINICAL DATA:  80 year old female with history of gastric outlet obstruction noted on endoscopy on 01/07/2016. Evaluate for potential mass versus ulcer. EXAM: CT ABDOMEN AND PELVIS WITH CONTRAST TECHNIQUE: Multidetector CT imaging of the abdomen and pelvis was performed using the standard protocol following bolus administration of intravenous contrast. CONTRAST:  162mL OMNIPAQUE IOHEXOL 300 MG/ML  SOLN COMPARISON:  CT the abdomen and pelvis 02/03/2009. FINDINGS: Lower chest: Scarring in left lower lobe. Cardiomegaly. Nasogastric tube in position. Hepatobiliary: No cystic or solid hepatic lesions. No intra or extrahepatic biliary ductal  dilatation. Status post cholecystectomy. Pancreas: Mild fatty atrophy in the pancreas, most notably in the inferior aspect of the pancreatic head and pancreatic uncinate process. No pancreatic mass. No pancreatic ductal dilatation. No pancreatic or peripancreatic fluid or inflammatory changes. Spleen: Unremarkable. Adrenals/Urinary Tract: Mild diffuse cortical thinning in the kidneys bilaterally. Sub cm low-attenuation lesion in the lower pole of the left kidney is too small to definitively characterize, but is statistically likely a tiny cyst. The appearance of the right kidney is otherwise unremarkable. Bilateral adrenal glands are normal in appearance. No hydroureteronephrosis. Urinary bladder is normal in appearance. Stomach/Bowel: Nasogastric tube extends to the antrum of the stomach. Stomach is otherwise grossly unremarkable in appearance. Specifically, no definite gastric mass identified. Accurate assessment for ulcer disease is not routinely possible on CT examinations. Stomach does not appear distended. No pathologic dilatation of small bowel  or colon. Normal appendix. Vascular/Lymphatic: Atherosclerosis throughout the abdominal and pelvic vasculature, without evidence of aneurysm or dissection. No lymphadenopathy noted in the abdomen or pelvis. Reproductive: Uterus and ovaries are atrophic. Other: No significant volume of ascites.  No pneumoperitoneum. Musculoskeletal: Multilevel degenerative disc disease in the lumbar spine, most severe at L3-L4 and L4-L5. There is 9 mm of anterolisthesis of L3 upon L4 and 6 mm of anterolisthesis of L4 upon L5. There are no aggressive appearing lytic or blastic lesions noted in the visualized portions of the skeleton. IMPRESSION: 1. No definite source for gastric outlet obstruction identified. Specifically, no definite gastric mass. 2. No acute findings in the abdomen or pelvis. 3. Extensive atherosclerosis. 4. Additional incidental findings, as above. Electronically  Signed   By: Vinnie Langton M.D.   On: 01/10/2016 07:38   Dg Duanne Limerick  W/kub  01/06/2016  CLINICAL DATA:  Dysphagia EXAM: UPPER GI SERIES WITH KUB TECHNIQUE: After obtaining a scout radiograph a routine upper GI series was performed using thin and thick barium FLUOROSCOPY TIME:  Radiation Exposure Index (as provided by the fluoroscopic device): 111 d Gy cm2 If the device does not provide the exposure index: Fluoroscopy Time (in minutes and seconds): Number of Acquired Images: COMPARISON:  Barium swallow 07/13/2009 FINDINGS: Fluoroscopic evaluation of swallowing demonstrates no fixed stricture within the esophagus. There is mild generalized tapering of the distal third of the esophagus. No fold thickening. There is an elongated stomach. No gastric emptying can be visualized during the study. The patient was left on her right side to for 15-20 minutes. Despite this, the stomach never emptied. There is retained food material throughout the stomach. IMPRESSION: No emptying of the stomach despite waiting up to 20 minutes. Findings compatible with gastric outlet obstruction. Retained food material within the stomach. Electronically Signed   By: Rolm Baptise M.D.   On: 01/06/2016 11:39     CBC  Recent Labs Lab 01/06/16 1356 01/08/16 0526 01/09/16 0515  WBC 6.0 5.5 5.4  HGB 14.0 12.5 12.1  HCT 41.5 37.6 35.5*  PLT 186 149* 159  MCV 105.6* 107.7* 106.6*  MCH 35.6* 35.8* 36.3*  MCHC 33.7 33.2 34.1  RDW 14.2 14.7 14.5  LYMPHSABS  --  1.1 1.1  MONOABS  --  0.7 0.8  EOSABS  --  0.2 0.1  BASOSABS  --  0.0 0.0    Chemistries   Recent Labs Lab 01/06/16 1356 01/07/16 0627 01/08/16 0526 01/09/16 0515 01/10/16 0430  NA 139 139 138 138 134*  K 4.4 4.0 3.1* 3.2* 4.3  CL 100* 107 109 111 107  CO2 25 21* 18* 23 23  GLUCOSE 101* 76 144* 146* 203*  BUN 16 12 10 9 12   CREATININE 0.72 0.63 0.59 0.39* 0.48  CALCIUM 9.5 8.2* 7.8* 8.1* 8.0*  MG  --   --  2.1 1.9 2.2  AST 29 22 18 17   --   ALT 30 24 19 17    --   ALKPHOS 53 38 36* 33*  --   BILITOT 1.1 1.2 1.2 0.3  --    ------------------------------------------------------------------------------------------------------------------ estimated creatinine clearance is 46.6 mL/min (by C-G formula based on Cr of 0.48). ------------------------------------------------------------------------------------------------------------------ No results for input(s): HGBA1C in the last 72 hours. ------------------------------------------------------------------------------------------------------------------  Recent Labs  01/08/16 0525 01/09/16 0515  TRIG 93 81   ------------------------------------------------------------------------------------------------------------------ No results for input(s): TSH, T4TOTAL, T3FREE, THYROIDAB in the last 72 hours.  Invalid input(s): FREET3 ------------------------------------------------------------------------------------------------------------------ No results for input(s): VITAMINB12, FOLATE, FERRITIN, TIBC, IRON, RETICCTPCT  in the last 72 hours.  Coagulation profile No results for input(s): INR, PROTIME in the last 168 hours.  No results for input(s): DDIMER in the last 72 hours.  Cardiac Enzymes No results for input(s): CKMB, TROPONINI, MYOGLOBIN in the last 168 hours.  Invalid input(s): CK ------------------------------------------------------------------------------------------------------------------ Invalid input(s): POCBNP     Time Spent in minutes   20 minutes   Lorrinda Ramstad M.D on 01/10/2016 at 5:14 PM  Between 7am to 7pm - Pager - [phone]  After 7pm go to [email or web address] - password [facility]  [facility]   Office  [phone]

## 2016-01-10 NOTE — Progress Notes (Addendum)
[place] NOTE  Pharmacy Consult for TPN Indication: Gastric outlet obstruction  Allergies  Allergen Reactions  . Peanut-Containing Drug Products Anaphylaxis  . Reglan [Metoclopramide] Other (See Comments)    "Messed my mind up"  . Codeine Nausea Only  . Iohexol Swelling     Desc: pt received hypaque 60% in 1994 and had erythema, hives & lips swell.  She did ok w/o premeds in 8/05 at [facility].  Pt. takes daily prednisone for rheumatoid arthritis.  Poor venous access today (7/7/6) so we did her w/o iv contrast.   . Pineapple Swelling  . Shellfish Allergy Swelling    Lips swell   Patient Measurements: Height: 5\' 2"  (157.5 cm) Weight: 117 lb (53.071 kg) IBW/kg (Calculated) : 50.1 Adjusted Body Weight:  Usual Weight:   Vital Signs: Temp: 97.5 F (36.4 C) (03/07 0610) Temp Source: Oral (03/07 0610) BP: 93/59 mmHg (03/07 0610) Pulse Rate: 67 (03/07 0610) Intake/Output from previous day: 03/06 0701 - 03/07 0700 In: 910.7 [I.V.:133.7; TPN:777] Out: 775 [Urine:750; Emesis/NG output:25] Intake/Output from this shift:    Labs:  Recent Labs  01/08/16 0526 01/09/16 0515  WBC 5.5 5.4  HGB 12.5 12.1  HCT 37.6 35.5*  PLT 149* 159    Recent Labs  01/08/16 0525 01/08/16 0526 01/09/16 0515 01/10/16 0430  NA  --  138 138 134*  K  --  3.1* 3.2* 4.3  CL  --  109 111 107  CO2  --  18* 23 23  GLUCOSE  --  144* 146* 203*  BUN  --  10 9 12   CREATININE  --  0.59 0.39* 0.48  CALCIUM  --  7.8* 8.1* 8.0*  MG  --  2.1 1.9 2.2  PHOS  --  2.9 2.1* 2.7  PROT  --  4.9* 4.4*  --   ALBUMIN  --  2.9* 2.6*  --   AST  --  18 17  --   ALT  --  19 17  --   ALKPHOS  --  36* 33*  --   BILITOT  --  1.2 0.3  --   PREALBUMIN  --  11.1* 11.4*  --   TRIG 93  --  81  --    Estimated Creatinine Clearance: 46.6 mL/min (by C-G formula based on Cr of 0.48).    Recent Labs  01/09/16 1203 01/09/16 2358 01/10/16 0602  GLUCAP 120* 157* 188*    Medical History: Past Medical  History  Diagnosis Date  . Cervical stenosis of spine   . Spondylolisthesis of lumbar region   . A-fib ([place])   . Asthma   . Depression   . Dyslipidemia   . GERD (gastroesophageal reflux disease)   . Obesity   . PVC's (premature ventricular contractions)   . PUD (peptic ulcer disease)     can not take aspirin  . Osteoporosis     alendronate since 2012  . Macular degeneration   . Aortic stenosis     mild by echo 03/2012  . Hypertension   . CAD (coronary artery disease)     s/p PCI with BMS to mid LAD--2/08 not on aspirin due to frequent ulcers.  Repeat cath for CP 09/2012 with patent LAD stent and otherwise normal coronary arteries  . Elbow fracture, right     history of- never any surgery  . Rheumatoid arthritis(714.0)     Dr. Lenna Gilford follows  . Impaired physical mobility     due to rheumatoid arthritis "  ambulates with walker" limited free standing.   Medications:  Scheduled:  . fluticasone  1 spray Each Nare Daily  . heparin  5,000 Units Subcutaneous 3 times per day  . insulin aspart  0-9 Units Subcutaneous Q6H  . LORazepam  2 mg Oral QHS  . methylPREDNISolone (SOLU-MEDROL) injection  20 mg Intravenous Daily  . nystatin cream   Topical BID  . pantoprazole (PROTONIX) IV  40 mg Intravenous Q12H  . sodium chloride flush  10-40 mL Intracatheter Q12H   Infusions:  . Marland KitchenTPN (CLINIMIX-E) Adult 50 mL/hr at 01/09/16 1722   And  . fat emulsion 240 mL (01/09/16 1722)  . sodium chloride 10 mL/hr at 01/09/16 1828   Insulin Requirements: sensitive SSI q6h (3 units over the past 24 hrs) - on solu-medrol  Current Nutrition: NPO  IVF: NS at 35 ml/hr  Central access: 3/4 TPN start date: 3/5  ASSESSMENT                                                                                                          HPI: 83 yoF with recurrent abd pain since June 2016. Complaint of severe nausea, heartburn, loose stools, 20 lb weight loss in 3 months.  EGD 12/16 with large amount of retained  food.  EGD 3/4: Gastric Outlet Obstruction with polypoid lesion, ulcer,edema, gastritis. Possible gastrojejunostomy, await pathology report.  Significant events:  3/4: EGD with biopsy of ulcerated polypoid lesions  Today:   Glucose (goal <150): 120-203-- one reading over 200  (on solu-medrol)  Electrolytes:  Phos 2.7 and Mag 2.2--up s/p repletion, Na slightly low; K+, Corr Ca wnl  Renal: scr low  LFTs: wnl  TGs: 93 (3/5)  Prealbumin - 11.1 (3/5 on steroids, will artificially elevate level)  NUTRITIONAL GOALS                                                                                             RD recs:  Kcal: 1350-1550 Protein: 65-75 grams Fluid: 2 L/day  Clinimix / at a goal rate of 60 ml/hr + 20% fat emulsion at 10 ml/hr to provide: 72 g/day protein, 1502 Kcal/day (Per RD recom.)  PLAN                                                                                                                          -  At 1800 today:  Increase Clinimix E 5/15 to goal rate 60 ml/hr.  20% fat emulsion at 10 ml/hr.  TPN to contain standard multivitamins and trace elements.  continue IVF to [facility]  continue SSI Novolog sensitive scale q6hr  TPN lab panels on Mondays & Thursdays.  F/u daily.  Dia Sitter, PharmD, BCPS 01/10/2016 8:35 AM

## 2016-01-10 NOTE — Progress Notes (Signed)
GI: Still awaiting path. CT shows no mass. Will likely need surgery

## 2016-01-11 DIAGNOSIS — M05711 Rheumatoid arthritis with rheumatoid factor of right shoulder without organ or systems involvement: Secondary | ICD-10-CM

## 2016-01-11 DIAGNOSIS — I10 Essential (primary) hypertension: Secondary | ICD-10-CM

## 2016-01-11 DIAGNOSIS — K3184 Gastroparesis: Secondary | ICD-10-CM

## 2016-01-11 DIAGNOSIS — E785 Hyperlipidemia, unspecified: Secondary | ICD-10-CM

## 2016-01-11 DIAGNOSIS — M05712 Rheumatoid arthritis with rheumatoid factor of left shoulder without organ or systems involvement: Secondary | ICD-10-CM

## 2016-01-11 DIAGNOSIS — E43 Unspecified severe protein-calorie malnutrition: Secondary | ICD-10-CM

## 2016-01-11 LAB — GLUCOSE, CAPILLARY
GLUCOSE-CAPILLARY: 119 mg/dL — AB (ref 65–99)
GLUCOSE-CAPILLARY: 120 mg/dL — AB (ref 65–99)
GLUCOSE-CAPILLARY: 136 mg/dL — AB (ref 65–99)
Glucose-Capillary: 120 mg/dL — ABNORMAL HIGH (ref 65–99)
Glucose-Capillary: 138 mg/dL — ABNORMAL HIGH (ref 65–99)

## 2016-01-11 MED ORDER — LORAZEPAM 2 MG/ML IJ SOLN
1.0000 mg | Freq: Every day | INTRAMUSCULAR | Status: DC
  Administered 2016-01-11 – 2016-01-15 (×5): 1 mg via INTRAVENOUS
  Filled 2016-01-11 (×5): qty 1

## 2016-01-11 MED ORDER — TRACE MINERALS CR-CU-MN-SE-ZN 10-1000-500-60 MCG/ML IV SOLN
INTRAVENOUS | Status: AC
  Administered 2016-01-11: 18:00:00 via INTRAVENOUS
  Filled 2016-01-11: qty 1440

## 2016-01-11 MED ORDER — FAT EMULSION 20 % IV EMUL
240.0000 mL | INTRAVENOUS | Status: AC
  Administered 2016-01-11: 240 mL via INTRAVENOUS
  Filled 2016-01-11: qty 240

## 2016-01-11 NOTE — Progress Notes (Signed)
Nutrition Follow-up  DOCUMENTATION CODES:   Severe malnutrition in context of acute illness/injury  INTERVENTION:  - Continue TPN per pharmacy - Continue sips of CLD and diet advancement per surgery team - RD will continue to monitor for additional needs  NUTRITION DIAGNOSIS:   Inadequate oral intake related to inability to eat as evidenced by NPO status. -ongoing, permitted sips of CLD  GOAL:   Patient will meet greater than or equal to 90% of their needs -met with TPN regimen alone  MONITOR:   Diet advancement, PO intake, Weight trends, Labs, Skin, I & O's, Other (Comment) (TPN regimen)  ASSESSMENT:   80 y.o. female, with past medical history of coronary artery disease status post PCI, hypertension, hyperlipidemia, rheumatoid arthritis, peptic ulcer disease, aortic stenosis, patient was sent by Dr. Norwood Levo [facility] for abnormal upper GI series, she reports she saw Dr. Wynetta Emery yesterday, ordered upper GI series for her, results were abnormal, where she had contrast in stomach, did not pass through, soshe was instructed to come to ED, she denies any abdominal pain, fever, chills, or vomiting, but she has complaints of nausea for few month, weight loss, and diarrhea over last few days as she's been receiving oral antibiotic for tooth infection, patient had EGD in December 20suspicious for severe gastroparesis(EGD incomplete as pylorus could not be located),   3/8 Pt receiving Clinimix E 5/15 @ 60 mL/hr with 20% lipids @ 10 mL/hr (goal rate) at this time which is providing 72 grams of protein and 1502 kcal; this fully meets nutrition needs. NGT remains in place but has been clamped since yesterday (3/7). Pt denies abdominal pain/pressure or nausea with clamping. She is permitted 56PV CLD every 1 hour and states that she has been tolerating sips of juice and water. She asks if she can have a popsicle; RD feels this would be appropriate but encouraged pt to ask RN if this is permitted per  surgery team plan for intakes. Pt states that PA student this AM informed her she may be able to have some broth by dinner-time today.  Per GI note this AM at 0939: Gastric outlet obstruction, presumed benign etiology, evidence of improvement clinically and by CT scan. Discontinue NG tube when decided by surgery. Advance diet slowly. If does not tolerate reasonable advancement of diet, repeat EGD with consideration of repeat biopsy and/or balloon dilatation. If is able to advance diet without undue symptomatology, would still repeat EGD in 4-8 weeks.  RD will continue to monitor diet advancement and related nutrition needs. Medications reviewed. Labs reviewed; CBGs: 120-188 mg/dL since yesterday (3/7) AM, Na: 134 mmol/L, Ca: 8 mg/dL.    3/6 - Pt receiving Clinimix E 5/15 @ 40 mL/hr with 20% lipids @ 10 mL/hr which is providing 48 grams of protein, 1162 kcal and this regimen was initiated on 01/07/16.  - Plan per pharmacy is for goal of Clinimix E 5/15 @ 80 mL/hr with 20% lipids @ 10 mL/hr which will provide 96 grams of protein, 1843 kcal. Current kcal and protein needs based on medical hx and current dx.  - Needs will be adjusted should malignancy be found.  - Recommend new goal of Clinimix E 5/15 @ 60 mL/hr with 20% lipids @ 10 mL/hr which will provide 72 grams of protein, 1502 kcal.  - Pt with NGT to suction with 500cc drainage present at time of RD visit.  - Pt had EGD 3/4 showing: Gastric Outlet Obstruction - polypoid lesion noted with surrounding ulcer and edema -  s/p biopsies. Gastritis (distal stomach).  - Per GI note following procedure, pt may require gastrojejunostomy tube; RD will provide TF recommendations should this be placed and if pt to transition to TF from TPN.  - Pt reports that last PO of solid food and liquids was Thursday (3/2) PM.  - She states that for the 5-6 months PTA she was progressively experiencing abdominal pain and nausea with PO intakes and during that time was also  progressively unable to consume adequate intakes (intakes were less than usual). - She states that in the past 2 months, related to this, she has lost 20 lbs. - This would indicate 14.5% wt loss in this time frame which is significant.  - Chart review shows 6 lb weight loss (5% wt loss) in the past 1 month which is significant for time frame.  - Physical assessment shows moderate muscle and moderate fat wasting to upper and lower body.  - Pt states relief in abdominal discomfort and nausea this AM.  - She also states she is feeling hungry and is looking forward to when she is able to consume PO.  - She states she has had a few ice chips today.   Diet Order:  .TPN (CLINIMIX-E) Adult  Skin:  Wound (see comment) (Bilateral perineum MSAD)  Last BM:  3/7  Height:   Ht Readings from Last 1 Encounters:  01/07/16 '5\' 2"'  (1.575 m)    Weight:   Wt Readings from Last 1 Encounters:  01/07/16 117 lb (53.071 kg)    Ideal Body Weight:  50 kg (kg)  BMI:  Body mass index is 21.39 kg/(m^2).  Estimated Nutritional Needs:   Kcal:  1350-1550  Protein:  65-75 grams  Fluid:  2 L/day  EDUCATION NEEDS:   No education needs identified at this time     Jarome Matin, RD, LDN Inpatient Clinical Dietitian Pager # [phone] After hours/weekend pager # [phone]

## 2016-01-11 NOTE — Progress Notes (Addendum)
[facility] PROGRESS NOTE  Angela [name] [id number] DOB: 1935-12-28 DOA: 01/06/2016 PCP: Irven Shelling, MD  Brief Summary  The patient is a 80 year old female with history of coronary artery disease status post PCI, hypertension, hyperlipidemia, rheumatoid arthritis, peptic ulcer disease, aortic stenosis who was sent to the hospital by [facility], Dr. Wynetta Emery, for gastric outlet obstruction after an endoscopy on 3/4 demonstrated evidence of gastric outlet obstruction near the pylorus due to a mass or an ulcer with scarring. An NG tube was placed to suction and the patient was started on TPN. Initially there was thought that she may need surgery however, at this point the surgeons are hoping for resolution with conservative measures.  Assessment/Plan  Gastric outlet obstruction due to duodenitis with polypoid lesion in pyloric channel and surrounding ulcer and edema, biopsy negative for malignancy.  "Gastroparesis" was likely secondary to gastric outlet obstruction.  Not a diabetic. - NG tube clamped and trial of clears today -  Appreciate GI and general surgery assistance - Continue with TPN with until tolerating full liquid diet - CT abdomen and pelviswith no evidence of masses or malignancy\ -  Continue PPI -  Will need repeat EGD if not able to advance diet -  If able to advance diet, plan for outpatient EGD in 4-8 weeks  Rheumatoid arthritis  - Patient is a steroid dependent, continue low dose IV Solu-Medrol  - Continue to hold methotrexate   Coronary artery disease - Denies any chest pain or shortness of breath,  -resume plavix once she has proven that she is tolerating PO and will not need surgery  History of PVC, bradycardia, and atrial tachycardia -  Followed by cardiology -  Patient is nothing by mouth, start IV beta blockers  Paroxysmal atrial fibrillation, CHADs2vasc = 4, related to severe illness/ICU stay several years ago, cardioverted with amiodarone,  and per cardiology, no longer active problem.  Has had several outpatient monitors with no a-fib.  Additionally felt to be falls risk and not a good candidate for anticoagulation  Hyperlipidemia - Resume statin when stable  Diarrhea - C diff negative  Severe protein calorie malnutrition -Continue with TPN, hypokalemia, hypophosphatemia repleted by pharmacy as part of TPN protocol  Diet:  CLD Access:  PICC right arm IVF:  TPN Proph:  heparin  Code Status: full Family Communication: patient and her caretaker Disposition Plan: pending able to tolerate diet   Procedures  EGD 01/07/2016    Consults  Gastroenterology  General surgery   HPI/Subjective:  States that she has not had nausea or abdominal pain since her NG was clamped.  She is anxious that we make sure that she is truly able to tolerate a diet before she leaves because she "has been dealing with this since June or July" of last year.  Diarrhea slowing.  Voiding and breathing without difficulty.  Objective: Filed Vitals:   01/10/16 1500 01/10/16 1808 01/10/16 2358 01/11/16 0626  BP: 138/72  101/66 105/66  Pulse: 65  88 84  Temp: 98.3 F (36.8 C) 97.8 F (36.6 C) 97.8 F (36.6 C) 97.6 F (36.4 C)  TempSrc: Oral Oral Oral Oral  Resp: 18  17 18   Height:      Weight:      SpO2: 100%  99% 99%    Intake/Output Summary (Last 24 hours) at 01/11/16 1512 Last data filed at 01/11/16 1100  Gross per 24 hour  Intake      0 ml  Output    150  ml  Net   -150 ml   Filed Weights   01/07/16 0600  Weight: 53.071 kg (117 lb)   Body mass index is 21.39 kg/(m^2).  Exam:   General:  Adult female, No acute distress  HEENT:  NCAT, MMM, copious clear fluid in canister, NG currently clamped  Cardiovascular:  RRR, nl S1, S2 no mrg, 2+ pulses, warm extremities  Respiratory:  CTAB, no increased WOB  Abdomen:   NABS, soft, NT/ND  MSK:   Decreased tone and bulk, no LEE, hands have ulnar deviation of MCP joints with  joint swelling at elbows, wrists, and PIP joints with limited ROM and some decreased muscle bulk  Neuro:  Diffusely weak secondary to joint deformities/RA  Data Reviewed: Basic Metabolic Panel:  Recent Labs Lab 01/06/16 1356 01/07/16 0627 01/08/16 0526 01/09/16 0515 01/10/16 0430  NA 139 139 138 138 134*  K 4.4 4.0 3.1* 3.2* 4.3  CL 100* 107 109 111 107  CO2 25 21* 18* 23 23  GLUCOSE 101* 76 144* 146* 203*  BUN 16 12 10 9 12   CREATININE 0.72 0.63 0.59 0.39* 0.48  CALCIUM 9.5 8.2* 7.8* 8.1* 8.0*  MG  --   --  2.1 1.9 2.2  PHOS  --   --  2.9 2.1* 2.7   Liver Function Tests:  Recent Labs Lab 01/06/16 1356 01/07/16 0627 01/08/16 0526 01/09/16 0515  AST 29 22 18 17   ALT 30 24 19 17   ALKPHOS 53 38 36* 33*  BILITOT 1.1 1.2 1.2 0.3  PROT 6.5 5.0* 4.9* 4.4*  ALBUMIN 4.2 3.1* 2.9* 2.6*    Recent Labs Lab 01/06/16 1356  LIPASE 36   No results for input(s): AMMONIA in the last 168 hours. CBC:  Recent Labs Lab 01/06/16 1356 01/08/16 0526 01/09/16 0515  WBC 6.0 5.5 5.4  NEUTROABS  --  3.4 3.4  HGB 14.0 12.5 12.1  HCT 41.5 37.6 35.5*  MCV 105.6* 107.7* 106.6*  PLT 186 149* 159    Recent Results (from the past 240 hour(s))  C difficile quick scan w PCR reflex     Status: None   Collection Time: 01/10/16 12:01 AM  Result Value Ref Range Status   C Diff antigen NEGATIVE NEGATIVE Final   C Diff toxin NEGATIVE NEGATIVE Final   C Diff interpretation Negative for toxigenic C. difficile  Final     Studies: Ct Abdomen Pelvis W Contrast  01/10/2016  CLINICAL DATA:  80 year old female with history of gastric outlet obstruction noted on endoscopy on 01/07/2016. Evaluate for potential mass versus ulcer. EXAM: CT ABDOMEN AND PELVIS WITH CONTRAST TECHNIQUE: Multidetector CT imaging of the abdomen and pelvis was performed using the standard protocol following bolus administration of intravenous contrast. CONTRAST:  193mL OMNIPAQUE IOHEXOL 300 MG/ML  SOLN COMPARISON:  CT the  abdomen and pelvis 02/03/2009. FINDINGS: Lower chest: Scarring in left lower lobe. Cardiomegaly. Nasogastric tube in position. Hepatobiliary: No cystic or solid hepatic lesions. No intra or extrahepatic biliary ductal dilatation. Status post cholecystectomy. Pancreas: Mild fatty atrophy in the pancreas, most notably in the inferior aspect of the pancreatic head and pancreatic uncinate process. No pancreatic mass. No pancreatic ductal dilatation. No pancreatic or peripancreatic fluid or inflammatory changes. Spleen: Unremarkable. Adrenals/Urinary Tract: Mild diffuse cortical thinning in the kidneys bilaterally. Sub cm low-attenuation lesion in the lower pole of the left kidney is too small to definitively characterize, but is statistically likely a tiny cyst. The appearance of the right kidney is otherwise unremarkable. Bilateral  adrenal glands are normal in appearance. No hydroureteronephrosis. Urinary bladder is normal in appearance. Stomach/Bowel: Nasogastric tube extends to the antrum of the stomach. Stomach is otherwise grossly unremarkable in appearance. Specifically, no definite gastric mass identified. Accurate assessment for ulcer disease is not routinely possible on CT examinations. Stomach does not appear distended. No pathologic dilatation of small bowel or colon. Normal appendix. Vascular/Lymphatic: Atherosclerosis throughout the abdominal and pelvic vasculature, without evidence of aneurysm or dissection. No lymphadenopathy noted in the abdomen or pelvis. Reproductive: Uterus and ovaries are atrophic. Other: No significant volume of ascites.  No pneumoperitoneum. Musculoskeletal: Multilevel degenerative disc disease in the lumbar spine, most severe at L3-L4 and L4-L5. There is 9 mm of anterolisthesis of L3 upon L4 and 6 mm of anterolisthesis of L4 upon L5. There are no aggressive appearing lytic or blastic lesions noted in the visualized portions of the skeleton. IMPRESSION: 1. No definite source for  gastric outlet obstruction identified. Specifically, no definite gastric mass. 2. No acute findings in the abdomen or pelvis. 3. Extensive atherosclerosis. 4. Additional incidental findings, as above. Electronically Signed   By: Vinnie Langton M.D.   On: 01/10/2016 07:38    Scheduled Meds: . fluticasone  1 spray Each Nare Daily  . heparin  5,000 Units Subcutaneous 3 times per day  . insulin aspart  0-9 Units Subcutaneous Q6H  . LORazepam  2 mg Oral QHS  . methylPREDNISolone (SOLU-MEDROL) injection  20 mg Intravenous Daily  . nystatin cream   Topical BID  . pantoprazole (PROTONIX) IV  40 mg Intravenous Q12H  . sodium chloride flush  10-40 mL Intracatheter Q12H   Continuous Infusions: . Marland KitchenTPN (CLINIMIX-E) Adult 60 mL/hr at 01/10/16 1820   And  . fat emulsion 240 mL (01/10/16 1820)  . Marland KitchenTPN (CLINIMIX-E) Adult     And  . fat emulsion    . sodium chloride 10 mL/hr at 01/09/16 1828    Active Problems:   Rheumatoid arthritis ([place])   Dyslipidemia   Essential hypertension, benign   Gastric outlet obstruction   Gastroparesis   Protein-calorie malnutrition, severe    Time spent: 30 min    Aalivia Mcgraw, [facility] Pager [phone]. If 7PM-7AM, please contact night-coverage at [email or web address], password [facility] 01/11/2016, 3:12 PM  LOS: 5 days

## 2016-01-11 NOTE — Progress Notes (Cosign Needed)
4 Days Post-Op  Subjective: Pt tolerating sips of water and juice without nausea. Hasn't had a BM since yesterday morning but has had some flatus. Notes dry mouth which made it difficult to swallow her PO Ativan last night. Denies fever or abd pain.   Objective: Vital signs in last 24 hours: Temp:  [97.6 F (36.4 C)-98.3 F (36.8 C)] 97.6 F (36.4 C) (03/08 0626) Pulse Rate:  [65-88] 84 (03/08 0626) Resp:  [17-18] 18 (03/08 0626) BP: (101-138)/(66-72) 105/66 mmHg (03/08 0626) SpO2:  [99 %-100 %] 99 % (03/08 0626) Last BM Date: 01/10/16  Intake/Output from previous day: 03/07 0701 - 03/08 0700 In: 0  Out: 151 [Urine:150; Stool:1] Intake/Output this shift:    Gen: pleasant, WDWN female, alert, NAD Abd: soft, NT/ND, +BS, no masses, organomegaly  Lab Results:   Recent Labs  01/09/16 0515  WBC 5.4  HGB 12.1  HCT 35.5*  PLT 159   BMET  Recent Labs  01/09/16 0515 01/10/16 0430  NA 138 134*  K 3.2* 4.3  CL 111 107  CO2 23 23  GLUCOSE 146* 203*  BUN 9 12  CREATININE 0.39* 0.48  CALCIUM 8.1* 8.0*   PT/INR No results for input(s): LABPROT, INR in the last 72 hours. ABG No results for input(s): PHART, HCO3 in the last 72 hours.  Invalid input(s): PCO2, PO2  Studies/Results: Ct Abdomen Pelvis W Contrast  01/10/2016  CLINICAL DATA:  80 year old female with history of gastric outlet obstruction noted on endoscopy on 01/07/2016. Evaluate for potential mass versus ulcer. EXAM: CT ABDOMEN AND PELVIS WITH CONTRAST TECHNIQUE: Multidetector CT imaging of the abdomen and pelvis was performed using the standard protocol following bolus administration of intravenous contrast. CONTRAST:  173mL OMNIPAQUE IOHEXOL 300 MG/ML  SOLN COMPARISON:  CT the abdomen and pelvis 02/03/2009. FINDINGS: Lower chest: Scarring in left lower lobe. Cardiomegaly. Nasogastric tube in position. Hepatobiliary: No cystic or solid hepatic lesions. No intra or extrahepatic biliary ductal dilatation. Status  post cholecystectomy. Pancreas: Mild fatty atrophy in the pancreas, most notably in the inferior aspect of the pancreatic head and pancreatic uncinate process. No pancreatic mass. No pancreatic ductal dilatation. No pancreatic or peripancreatic fluid or inflammatory changes. Spleen: Unremarkable. Adrenals/Urinary Tract: Mild diffuse cortical thinning in the kidneys bilaterally. Sub cm low-attenuation lesion in the lower pole of the left kidney is too small to definitively characterize, but is statistically likely a tiny cyst. The appearance of the right kidney is otherwise unremarkable. Bilateral adrenal glands are normal in appearance. No hydroureteronephrosis. Urinary bladder is normal in appearance. Stomach/Bowel: Nasogastric tube extends to the antrum of the stomach. Stomach is otherwise grossly unremarkable in appearance. Specifically, no definite gastric mass identified. Accurate assessment for ulcer disease is not routinely possible on CT examinations. Stomach does not appear distended. No pathologic dilatation of small bowel or colon. Normal appendix. Vascular/Lymphatic: Atherosclerosis throughout the abdominal and pelvic vasculature, without evidence of aneurysm or dissection. No lymphadenopathy noted in the abdomen or pelvis. Reproductive: Uterus and ovaries are atrophic. Other: No significant volume of ascites.  No pneumoperitoneum. Musculoskeletal: Multilevel degenerative disc disease in the lumbar spine, most severe at L3-L4 and L4-L5. There is 9 mm of anterolisthesis of L3 upon L4 and 6 mm of anterolisthesis of L4 upon L5. There are no aggressive appearing lytic or blastic lesions noted in the visualized portions of the skeleton. IMPRESSION: 1. No definite source for gastric outlet obstruction identified. Specifically, no definite gastric mass. 2. No acute findings in the abdomen or pelvis.  3. Extensive atherosclerosis. 4. Additional incidental findings, as above. Electronically Signed   By: Vinnie Langton M.D.   On: 01/10/2016 07:38    Anti-infectives: Anti-infectives    None      Assessment/Plan: Gastric Outlet Obstruction s/p EGD 01/07/16 Dr. Michail Sermon 1. Continue clamp of NG tube, advances of diet to possibly reproduce original symptoms 2. IVF, antiemetics if necessary 3. SCD's and IV heparin for DVT proph 4. Ambulate and IS  Rheumatoid Arthritis 1. Continue solumedrol, continue hold of methotrexate  Coronary Artery Disease History of PVC Hyperlipidemia Urinary Incontinence    LOS: 5 days    Delma Post, PA-S [facility] 01/11/2016

## 2016-01-11 NOTE — Progress Notes (Signed)
[facility] Progress Note  Subjective: The patient has tolerated 12 hours of NG clamping, tolerating some clear liquids without bloating distention or nausea  Objective: Vital signs in last 24 hours: Temp:  [97.6 F (36.4 C)-98.3 F (36.8 C)] 97.6 F (36.4 C) (03/08 0626) Pulse Rate:  [65-88] 84 (03/08 0626) Resp:  [17-18] 18 (03/08 0626) BP: (101-138)/(66-72) 105/66 mmHg (03/08 0626) SpO2:  [99 %-100 %] 99 % (03/08 0626) Weight change:    [id number]  Lab Results: Results for orders placed or performed during the hospital encounter of 01/06/16 (from the past 24 hour(s))  Glucose, capillary     Status: Abnormal   Collection Time: 01/10/16 12:16 PM  Result Value Ref Range   Glucose-Capillary 138 (H) 65 - 99 mg/dL   Comment 1 Notify RN   Glucose, capillary     Status: Abnormal   Collection Time: 01/10/16  6:04 PM  Result Value Ref Range   Glucose-Capillary 131 (H) 65 - 99 mg/dL   Comment 1 Notify RN   Glucose, capillary     Status: Abnormal   Collection Time: 01/10/16 11:55 PM  Result Value Ref Range   Glucose-Capillary 120 (H) 65 - 99 mg/dL  Glucose, capillary     Status: Abnormal   Collection Time: 01/11/16  6:25 AM  Result Value Ref Range   Glucose-Capillary 120 (H) 65 - 99 mg/dL    Studies/Results: Ct Abdomen Pelvis W Contrast  01/10/2016  CLINICAL DATA:  80 year old female with history of gastric outlet obstruction noted on endoscopy on 01/07/2016. Evaluate for potential mass versus ulcer. EXAM: CT ABDOMEN AND PELVIS WITH CONTRAST TECHNIQUE: Multidetector CT imaging of the abdomen and pelvis was performed using the standard protocol following bolus administration of intravenous contrast. CONTRAST:  118mL OMNIPAQUE IOHEXOL 300 MG/ML  SOLN COMPARISON:  CT the abdomen and pelvis 02/03/2009. FINDINGS: Lower chest: Scarring in left lower lobe. Cardiomegaly. Nasogastric tube in position. Hepatobiliary: No cystic or solid hepatic lesions. No intra or extrahepatic  biliary ductal dilatation. Status post cholecystectomy. Pancreas: Mild fatty atrophy in the pancreas, most notably in the inferior aspect of the pancreatic head and pancreatic uncinate process. No pancreatic mass. No pancreatic ductal dilatation. No pancreatic or peripancreatic fluid or inflammatory changes. Spleen: Unremarkable. Adrenals/Urinary Tract: Mild diffuse cortical thinning in the kidneys bilaterally. Sub cm low-attenuation lesion in the lower pole of the left kidney is too small to definitively characterize, but is statistically likely a tiny cyst. The appearance of the right kidney is otherwise unremarkable. Bilateral adrenal glands are normal in appearance. No hydroureteronephrosis. Urinary bladder is normal in appearance. Stomach/Bowel: Nasogastric tube extends to the antrum of the stomach. Stomach is otherwise grossly unremarkable in appearance. Specifically, no definite gastric mass identified. Accurate assessment for ulcer disease is not routinely possible on CT examinations. Stomach does not appear distended. No pathologic dilatation of small bowel or colon. Normal appendix. Vascular/Lymphatic: Atherosclerosis throughout the abdominal and pelvic vasculature, without evidence of aneurysm or dissection. No lymphadenopathy noted in the abdomen or pelvis. Reproductive: Uterus and ovaries are atrophic. Other: No significant volume of ascites.  No pneumoperitoneum. Musculoskeletal: Multilevel degenerative disc disease in the lumbar spine, most severe at L3-L4 and L4-L5. There is 9 mm of anterolisthesis of L3 upon L4 and 6 mm of anterolisthesis of L4 upon L5. There are no aggressive appearing lytic or blastic lesions noted in the visualized portions of the skeleton. IMPRESSION: 1. No definite source for gastric outlet obstruction identified. Specifically, no definite gastric mass. 2. No acute  findings in the abdomen or pelvis. 3. Extensive atherosclerosis. 4. Additional incidental findings, as above.  Electronically Signed   By: Vinnie Langton M.D.   On: 01/10/2016 07:38      Assessment: Gastric outlet obstruction, presumed benign etiology, evidence of improvement clinically and by CT scan.  Plan: Continue PPI Discontinue NG tube when decided by surgery. Advance diet slowly If does not tolerate reasonable advancement of diet, repeat EGD with consideration of repeat biopsy and/or balloon dilatation. If is able to advance diet without undue symptomatology, would still repeat EGD in 4-8 weeks.    Callahan Wild C 01/11/2016, 9:39 AM  Pager [phone] If no answer or after 5 PM call [phone]

## 2016-01-11 NOTE — Progress Notes (Signed)
[place] NOTE  Pharmacy Consult for TPN Indication: Gastric outlet obstruction  Allergies  Allergen Reactions  . Peanut-Containing Drug Products Anaphylaxis  . Reglan [Metoclopramide] Other (See Comments)    "Messed my mind up"  . Codeine Nausea Only  . Iohexol Swelling     Desc: pt received hypaque 60% in 1994 and had erythema, hives & lips swell.  She did ok w/o premeds in 8/05 at [facility].  Pt. takes daily prednisone for rheumatoid arthritis.  Poor venous access today (7/7/6) so we did her w/o iv contrast.   . Pineapple Swelling  . Shellfish Allergy Swelling    Lips swell   Patient Measurements: Height: 5\' 2"  (157.5 cm) Weight: 117 lb (53.071 kg) IBW/kg (Calculated) : 50.1 Adjusted Body Weight:  Usual Weight:   Vital Signs: Temp: 97.6 F (36.4 C) (03/08 0626) Temp Source: Oral (03/08 0626) BP: 105/66 mmHg (03/08 0626) Pulse Rate: 84 (03/08 0626) Intake/Output from previous day: 03/07 0701 - 03/08 0700 In: 0  Out: 151 [Urine:150; Stool:1] Intake/Output from this shift:    Labs:  Recent Labs  01/09/16 0515  WBC 5.4  HGB 12.1  HCT 35.5*  PLT 159    Recent Labs  01/09/16 0515 01/10/16 0430  NA 138 134*  K 3.2* 4.3  CL 111 107  CO2 23 23  GLUCOSE 146* 203*  BUN 9 12  CREATININE 0.39* 0.48  CALCIUM 8.1* 8.0*  MG 1.9 2.2  PHOS 2.1* 2.7  PROT 4.4*  --   ALBUMIN 2.6*  --   AST 17  --   ALT 17  --   ALKPHOS 33*  --   BILITOT 0.3  --   PREALBUMIN 11.4*  --   TRIG 81  --    Estimated Creatinine Clearance: 46.6 mL/min (by C-G formula based on Cr of 0.48).    Recent Labs  01/10/16 1804 01/10/16 2355 01/11/16 0625  GLUCAP 131* 120* 120*    Medical History: Past Medical History  Diagnosis Date  . Cervical stenosis of spine   . Spondylolisthesis of lumbar region   . A-fib ([place])   . Asthma   . Depression   . Dyslipidemia   . GERD (gastroesophageal reflux disease)   . Obesity   . PVC's (premature ventricular contractions)    . PUD (peptic ulcer disease)     can not take aspirin  . Osteoporosis     alendronate since 2012  . Macular degeneration   . Aortic stenosis     mild by echo 03/2012  . Hypertension   . CAD (coronary artery disease)     s/p PCI with BMS to mid LAD--2/08 not on aspirin due to frequent ulcers.  Repeat cath for CP 09/2012 with patent LAD stent and otherwise normal coronary arteries  . Elbow fracture, right     history of- never any surgery  . Rheumatoid arthritis(714.0)     Dr. Lenna Gilford follows  . Impaired physical mobility     due to rheumatoid arthritis "ambulates with walker" limited free standing.   Medications:  Scheduled:  . fluticasone  1 spray Each Nare Daily  . heparin  5,000 Units Subcutaneous 3 times per day  . insulin aspart  0-9 Units Subcutaneous Q6H  . LORazepam  2 mg Oral QHS  . methylPREDNISolone (SOLU-MEDROL) injection  20 mg Intravenous Daily  . nystatin cream   Topical BID  . pantoprazole (PROTONIX) IV  40 mg Intravenous Q12H  . sodium chloride flush  10-40 mL  Intracatheter Q12H   Infusions:  . Marland KitchenTPN (CLINIMIX-E) Adult 60 mL/hr at 01/10/16 1820   And  . fat emulsion 240 mL (01/10/16 1820)  . sodium chloride 10 mL/hr at 01/09/16 1828   Insulin Requirements: sensitive SSI q6h (3 units over the past 24 hrs) - on solu-medrol  Current Nutrition: NPO  IVF: NS at 35 ml/hr  Central access: 3/4 TPN start date: 3/5  ASSESSMENT                                                                                                          HPI: 65 yoF with recurrent abd pain since June 2016. Complaint of severe nausea, heartburn, loose stools, 20 lb weight loss in 3 months.  EGD 12/16 with large amount of retained food.  EGD 3/4: Gastric Outlet Obstruction with polypoid lesion, ulcer,edema, gastritis. Possible gastrojejunostomy, await pathology report.  Significant events:  3/4: EGD with biopsy of ulcerated polypoid lesions 3/7: Abd CT- no source of gastric obstruction  identified; clamping NGT 3/8: GI recom to adv diet slowly and proceed with EGD if patient does not tolerate  Today:   Glucose (goal <150): 120-188-- only one value over 150  (on solu-medrol)  Electrolytes:  Phos 2.7 and Mag 2.2--up s/p repletion, Na slightly low; K+, Corr Ca wnl (3/7)  Renal: scr low  LFTs: wnl  TGs: 93 (3/5)  Prealbumin - 11.1 (3/5 on steroids, will artificially elevate level)  NUTRITIONAL GOALS                                                                                             RD recs:  Kcal: 1350-1550 Protein: 65-75 grams Fluid: 2 L/day  Clinimix / at a goal rate of 60 ml/hr + 20% fat emulsion at 10 ml/hr to provide: 72 g/day protein, 1502 Kcal/day (Per RD recom.)  PLAN                                                                                                                          - At 1800 today:  continue Clinimix E 5/15 to goal rate 60 ml/hr.  20% fat emulsion at 10 ml/hr.  TPN to contain standard multivitamins and trace elements.  continue IVF to [facility]  continue SSI Novolog sensitive scale q6hr  TPN lab panels on Mondays & Thursdays.  F/u daily.  Dia Sitter, PharmD, BCPS 01/11/2016 7:12 AM

## 2016-01-12 LAB — PHOSPHORUS: PHOSPHORUS: 4 mg/dL (ref 2.5–4.6)

## 2016-01-12 LAB — COMPREHENSIVE METABOLIC PANEL
ALK PHOS: 33 U/L — AB (ref 38–126)
ALT: 28 U/L (ref 14–54)
AST: 23 U/L (ref 15–41)
Albumin: 2.6 g/dL — ABNORMAL LOW (ref 3.5–5.0)
Anion gap: 6 (ref 5–15)
BUN: 17 mg/dL (ref 6–20)
CALCIUM: 8.3 mg/dL — AB (ref 8.9–10.3)
CHLORIDE: 109 mmol/L (ref 101–111)
CO2: 23 mmol/L (ref 22–32)
CREATININE: 0.41 mg/dL — AB (ref 0.44–1.00)
GFR calc non Af Amer: >60 mL/min (ref >60)
GLUCOSE: 138 mg/dL — AB (ref 65–99)
Potassium: 3.8 mmol/L (ref 3.5–5.1)
SODIUM: 138 mmol/L (ref 135–145)
Total Bilirubin: 0.4 mg/dL (ref 0.3–1.2)
Total Protein: 4.6 g/dL — ABNORMAL LOW (ref 6.5–8.1)

## 2016-01-12 LAB — MAGNESIUM: Magnesium: 2 mg/dL (ref 1.7–2.4)

## 2016-01-12 LAB — GLUCOSE, CAPILLARY
GLUCOSE-CAPILLARY: 111 mg/dL — AB (ref 65–99)
GLUCOSE-CAPILLARY: 170 mg/dL — AB (ref 65–99)
Glucose-Capillary: 108 mg/dL — ABNORMAL HIGH (ref 65–99)
Glucose-Capillary: 108 mg/dL — ABNORMAL HIGH (ref 65–99)

## 2016-01-12 MED ORDER — METOPROLOL TARTRATE 1 MG/ML IV SOLN
2.5000 mg | Freq: Once | INTRAVENOUS | Status: AC
  Administered 2016-01-13: 2.5 mg via INTRAVENOUS
  Filled 2016-01-12: qty 5

## 2016-01-12 MED ORDER — ACEBUTOLOL HCL 200 MG PO CAPS
200.0000 mg | ORAL_CAPSULE | Freq: Two times a day (BID) | ORAL | Status: DC
  Administered 2016-01-13 – 2016-01-14 (×4): 200 mg via ORAL
  Filled 2016-01-12 (×5): qty 1

## 2016-01-12 MED ORDER — TRACE MINERALS CR-CU-MN-SE-ZN 10-1000-500-60 MCG/ML IV SOLN
INTRAVENOUS | Status: AC
  Administered 2016-01-12: 18:00:00 via INTRAVENOUS
  Filled 2016-01-12: qty 1440

## 2016-01-12 MED ORDER — FAT EMULSION 20 % IV EMUL
240.0000 mL | INTRAVENOUS | Status: AC
  Administered 2016-01-12: 240 mL via INTRAVENOUS
  Filled 2016-01-12: qty 250

## 2016-01-12 NOTE — Progress Notes (Signed)
Called NP due to pt HR elevated and irregular.  EKG obtained, pt in flutter.  Discussed pt home meds of Acebutolol and Plavix. NP to call back with orders. Fara Olden P

## 2016-01-12 NOTE — Progress Notes (Signed)
[place] NOTE  Pharmacy Consult for TPN Indication: Gastric outlet obstruction  Allergies  Allergen Reactions  . Peanut-Containing Drug Products Anaphylaxis  . Reglan [Metoclopramide] Other (See Comments)    "Messed my mind up"  . Codeine Nausea Only  . Iohexol Swelling     Desc: pt received hypaque 60% in 1994 and had erythema, hives & lips swell.  She did ok w/o premeds in 8/05 at [facility].  Pt. takes daily prednisone for rheumatoid arthritis.  Poor venous access today (7/7/6) so we did her w/o iv contrast.   . Pineapple Swelling  . Shellfish Allergy Swelling    Lips swell   Patient Measurements: Height: 5\' 2"  (157.5 cm) Weight: 117 lb (53.071 kg) IBW/kg (Calculated) : 50.1 Adjusted Body Weight:  Usual Weight:   Vital Signs: Temp: 97.7 F (36.5 C) (03/09 0525) Temp Source: Oral (03/09 0525) BP: 117/83 mmHg (03/09 0525) Pulse Rate: 97 (03/09 0525) Intake/Output from previous day: 03/08 0701 - 03/09 0700 In: 900 [P.O.:900] Out: 152 [Urine:151; Stool:1] Intake/Output from this shift:    Labs: No results for input(s): WBC, HGB, HCT, PLT, APTT, INR in the last 72 hours.  Recent Labs  01/10/16 0430 01/12/16 0442  NA 134* 138  K 4.3 3.8  CL 107 109  CO2 23 23  GLUCOSE 203* 138*  BUN 12 17  CREATININE 0.48 0.41*  CALCIUM 8.0* 8.3*  MG 2.2 2.0  PHOS 2.7 4.0  PROT  --  4.6*  ALBUMIN  --  2.6*  AST  --  23  ALT  --  28  ALKPHOS  --  33*  BILITOT  --  0.4   Estimated Creatinine Clearance: 46.6 mL/min (by C-G formula based on Cr of 0.41).    Recent Labs  01/11/16 1819 01/11/16 2342 01/12/16 0522  GLUCAP 138* 119* 108*    Medical History: Past Medical History  Diagnosis Date  . Cervical stenosis of spine   . Spondylolisthesis of lumbar region   . A-fib ([place])   . Asthma   . Depression   . Dyslipidemia   . GERD (gastroesophageal reflux disease)   . Obesity   . PVC's (premature ventricular contractions)   . PUD (peptic ulcer  disease)     can not take aspirin  . Osteoporosis     alendronate since 2012  . Macular degeneration   . Aortic stenosis     mild by echo 03/2012  . Hypertension   . CAD (coronary artery disease)     s/p PCI with BMS to mid LAD--2/08 not on aspirin due to frequent ulcers.  Repeat cath for CP 09/2012 with patent LAD stent and otherwise normal coronary arteries  . Elbow fracture, right     history of- never any surgery  . Rheumatoid arthritis(714.0)     Dr. Lenna Gilford follows  . Impaired physical mobility     due to rheumatoid arthritis "ambulates with walker" limited free standing.   Medications:  Scheduled:  . fluticasone  1 spray Each Nare Daily  . heparin  5,000 Units Subcutaneous 3 times per day  . insulin aspart  0-9 Units Subcutaneous Q6H  . LORazepam  1 mg Intravenous QHS  . methylPREDNISolone (SOLU-MEDROL) injection  20 mg Intravenous Daily  . nystatin cream   Topical BID  . pantoprazole (PROTONIX) IV  40 mg Intravenous Q12H  . sodium chloride flush  10-40 mL Intracatheter Q12H   Infusions:  . Marland KitchenTPN (CLINIMIX-E) Adult 60 mL/hr at 01/11/16 1814  And  . fat emulsion 240 mL (01/11/16 1814)  . sodium chloride 10 mL/hr at 01/09/16 1828   Insulin Requirements: sensitive SSI q6h (2 units over the past 24 hrs) - on solu-medrol  Current Nutrition: NPO--> clear liquid started on 3/8  IVF: NS at 10 ml/hr  Central access: 3/4 TPN start date: 3/5  ASSESSMENT                                                                                                          HPI: 84 yoF with recurrent abd pain since June 2016. Complaint of severe nausea, heartburn, loose stools, 20 lb weight loss in 3 months.  EGD 12/16 with large amount of retained food.  EGD 3/4: Gastric Outlet Obstruction with polypoid lesion, ulcer,edema, gastritis. Possible gastrojejunostomy, await pathology report.  Significant events:  3/4: EGD with biopsy of ulcerated polypoid lesions 3/7: Abd CT- no source of gastric  obstruction identified; clamping NGT 3/8: GI recom to adv diet slowly and proceed with EGD if patient does not tolerate 3/9: tolerating clear liquids-- she reported eating ~75% of her food  Today:   Glucose (goal <150): all readings<150 (on solu-medrol)  Electrolytes:  WNL  Renal: scr low  LFTs: wnl  TGs: 93 (3/5), 81 (3/6)  Prealbumin - 11.1 (3/5 on steroids, will artificially elevate level), 11.4 (3/6)  NUTRITIONAL GOALS                                                                                             RD recs:  Kcal: 1350-1550 Protein: 65-75 grams Fluid: 2 L/day  Clinimix / at a goal rate of 60 ml/hr + 20% fat emulsion at 10 ml/hr to provide: 72 g/day protein, 1502 Kcal/day (Per RD recom.)  PLAN                                                                                                                          - At 1800 today:  continue Clinimix E 5/15 to goal rate 60 ml/hr.  20% fat emulsion at 10 ml/hr.  TPN to contain standard multivitamins and trace elements.  continue IVF to [facility]  continue SSI  Novolog sensitive scale q6hr  TPN lab panels on Mondays & Thursdays.  F/u daily.  Dia Sitter, PharmD, BCPS 01/12/2016 7:30 AM

## 2016-01-12 NOTE — Progress Notes (Addendum)
[facility] PROGRESS NOTE  Angela [name] [id number] DOB: 1935-12-22 DOA: 01/06/2016 PCP: Irven Shelling, MD  Brief Summary  The patient is a 80 year old female with history of coronary artery disease status post PCI, hypertension, hyperlipidemia, rheumatoid arthritis, peptic ulcer disease, aortic stenosis who was sent to the hospital by [facility], Dr. Wynetta Emery, for gastric outlet obstruction after an endoscopy on 3/4 demonstrated evidence of gastric outlet obstruction near the pylorus due to a mass or an ulcer with scarring. An NG tube was placed to suction and the patient was started on TPN. Initially there was thought that she may need surgery however, at this point the surgeons are hoping for resolution with conservative measures.  Assessment/Plan  Gastric outlet obstruction due to duodenitis with polypoid lesion in pyloric channel and surrounding ulcer and edema, biopsy negative for malignancy.  "Gastroparesis" was likely secondary to gastric outlet obstruction.  Not a diabetic.   - NG tube will possibly be removed  -  Appreciate GI and general surgery assistance - Continue with TPN with until tolerating full liquid diet - CT abdomen and pelviswith no evidence of masses or malignancy -  Continue PPI -  Will need repeat EGD if not able to advance diet -  If able to advance diet, plan for outpatient EGD in 4-8 weeks -  Ambulate/OOB  Rheumatoid arthritis  - Patient is a steroid dependent, continue low dose IV Solu-Medrol until tolerating PO - Continue to hold methotrexate   Coronary artery disease - Denies any chest pain or shortness of breath,  -resume plavix once she has proven that she is tolerating PO and will not need surgery  History of PVC, bradycardia, and atrial tachycardia -  Followed by cardiology -  continue IV beta blockers  Paroxysmal atrial fibrillation, CHADs2vasc = 4, related to severe illness/ICU stay several years ago, cardioverted with amiodarone,  and per cardiology, no longer active problem.  Has had several outpatient monitors with no a-fib.  Additionally felt to be falls risk and not a good candidate for anticoagulation  Hyperlipidemia - Resume statin when stable  Diarrhea - C diff negative  Severe protein calorie malnutrition -Continue with TPN, hypokalemia, hypophosphatemia repleted by pharmacy as part of TPN protocol  Deconditioned, PT evaluation  Diet:  CLD Access:  PICC right arm IVF:  TPN Proph:  heparin  Code Status: full Family Communication: patient and her caretaker Disposition Plan: pending able to tolerate diet   Procedures  EGD 01/07/2016    Consults  Gastroenterology  General surgery   HPI/Subjective:  States that she has not had nausea or abdominal pain since her NG was clamped.  Passing flatus and denies abdominal distension or bloating.  Objective: Filed Vitals:   01/11/16 2139 01/12/16 0525 01/12/16 0900 01/12/16 1400  BP: 108/73 117/83 114/65 92/65  Pulse: 111 97 108 99  Temp: 97.9 F (36.6 C) 97.7 F (36.5 C) 98.2 F (36.8 C) 98.2 F (36.8 C)  TempSrc: Oral Oral Oral Oral  Resp: 18 18 20 22   Height:      Weight:      SpO2: 98% 98% 97% 99%    Intake/Output Summary (Last 24 hours) at 01/12/16 1627 Last data filed at 01/12/16 1400  Gross per 24 hour  Intake   1700 ml  Output      0 ml  Net   1700 ml   Filed Weights   01/07/16 0600  Weight: 53.071 kg (117 lb)   Body mass index is 21.39 kg/(m^2).  Exam:   General:  Adult female, No acute distress  HEENT:  NCAT, MMM, NG still in place  Cardiovascular:  RRR, nl S1, S2 no mrg, 2+ pulses, warm extremities  Respiratory:  CTAB, no increased WOB  Abdomen:   NABS, soft, NT/ND  MSK:   Decreased tone and bulk, no LEE, hands have ulnar deviation of MCP joints with joint swelling at elbows, wrists, and PIP joints with limited ROM and some decreased muscle bulk  Neuro:  Diffusely weak secondary to joint  deformities/RA  Data Reviewed: Basic Metabolic Panel:  Recent Labs Lab 01/07/16 0627 01/08/16 0526 01/09/16 0515 01/10/16 0430 01/12/16 0442  NA 139 138 138 134* 138  K 4.0 3.1* 3.2* 4.3 3.8  CL 107 109 111 107 109  CO2 21* 18* 23 23 23   GLUCOSE 76 144* 146* 203* 138*  BUN 12 10 9 12 17   CREATININE 0.63 0.59 0.39* 0.48 0.41*  CALCIUM 8.2* 7.8* 8.1* 8.0* 8.3*  MG  --  2.1 1.9 2.2 2.0  PHOS  --  2.9 2.1* 2.7 4.0   Liver Function Tests:  Recent Labs Lab 01/06/16 1356 01/07/16 0627 01/08/16 0526 01/09/16 0515 01/12/16 0442  AST 29 22 18 17 23   ALT 30 24 19 17 28   ALKPHOS 53 38 36* 33* 33*  BILITOT 1.1 1.2 1.2 0.3 0.4  PROT 6.5 5.0* 4.9* 4.4* 4.6*  ALBUMIN 4.2 3.1* 2.9* 2.6* 2.6*    Recent Labs Lab 01/06/16 1356  LIPASE 36   No results for input(s): AMMONIA in the last 168 hours. CBC:  Recent Labs Lab 01/06/16 1356 01/08/16 0526 01/09/16 0515  WBC 6.0 5.5 5.4  NEUTROABS  --  3.4 3.4  HGB 14.0 12.5 12.1  HCT 41.5 37.6 35.5*  MCV 105.6* 107.7* 106.6*  PLT 186 149* 159    Recent Results (from the past 240 hour(s))  C difficile quick scan w PCR reflex     Status: None   Collection Time: 01/10/16 12:01 AM  Result Value Ref Range Status   C Diff antigen NEGATIVE NEGATIVE Final   C Diff toxin NEGATIVE NEGATIVE Final   C Diff interpretation Negative for toxigenic C. difficile  Final     Studies: No results found.  Scheduled Meds: . fluticasone  1 spray Each Nare Daily  . heparin  5,000 Units Subcutaneous 3 times per day  . insulin aspart  0-9 Units Subcutaneous Q6H  . LORazepam  1 mg Intravenous QHS  . methylPREDNISolone (SOLU-MEDROL) injection  20 mg Intravenous Daily  . nystatin cream   Topical BID  . pantoprazole (PROTONIX) IV  40 mg Intravenous Q12H  . sodium chloride flush  10-40 mL Intracatheter Q12H   Continuous Infusions: . Marland KitchenTPN (CLINIMIX-E) Adult 60 mL/hr at 01/11/16 1814   And  . fat emulsion 240 mL (01/11/16 1814)  . Marland KitchenTPN  (CLINIMIX-E) Adult     And  . fat emulsion    . sodium chloride 10 mL/hr at 01/09/16 1828    Active Problems:   Rheumatoid arthritis ([place])   Dyslipidemia   Essential hypertension, benign   Gastric outlet obstruction   Gastroparesis   Protein-calorie malnutrition, severe    Time spent: 30 min    Donne Robillard, [facility] Pager [phone]. If 7PM-7AM, please contact night-coverage at [email or web address], password [facility] 01/12/2016, 4:27 PM  LOS: 6 days

## 2016-01-12 NOTE — Progress Notes (Signed)
  Subjective: Pt tolerating clear liquids without nausea, pain, acid reflux. Did mention she felt "full" but denied feeling bloated. Last BM was yesterday, +flatus. Worried about daughter who is in ICU.  Objective: Vital signs in last 24 hours: Temp:  [97.7 F (36.5 C)-97.9 F (36.6 C)] 97.7 F (36.5 C) (03/09 0525) Pulse Rate:  [97-111] 97 (03/09 0525) Resp:  [18] 18 (03/09 0525) BP: (108-143)/(73-83) 117/83 mmHg (03/09 0525) SpO2:  [98 %-100 %] 98 % (03/09 0525) Last BM Date: 01/11/16  900 In, 152 Out Diet clears Afebrile, VSS Creatinine, calcium, Alk, albumin, total protein all low   Intake/Output from previous day: 03/08 0701 - 03/09 0700 In: 900 [P.O.:900] Out: 152 [Urine:151; Stool:1] Intake/Output this shift:    Gen: pleasant, alert, NAD Abd: soft, NTND, +BS  Lab Results:  No results for input(s): WBC, HGB, HCT, PLT in the last 72 hours. BMET  Recent Labs  01/10/16 0430 01/12/16 0442  NA 134* 138  K 4.3 3.8  CL 107 109  CO2 23 23  GLUCOSE 203* 138*  BUN 12 17  CREATININE 0.48 0.41*  CALCIUM 8.0* 8.3*   PT/INR No results for input(s): LABPROT, INR in the last 72 hours. ABG No results for input(s): PHART, HCO3 in the last 72 hours.  Invalid input(s): PCO2, PO2  Studies/Results: No results found.  Anti-infectives: Anti-infectives    None      Assessment/Plan: Gastric Outlet Obstruction s/p EGD 01/07/16 Dr. Michail Sermon 1. Continue diet of clears, possibly remove NG tube later 2. IVF, antiemetics if necessary 3. PPI managed by GI 4. SCDs and IV heparin for DVT proph 5. Ambulate and IS  Rheumatoid Arthritis 1. Continue solumedrol, continue hold of methotrexate  Coronary Artery Disease History of PVC Hyperlipidemia Urinary Incontinence   LOS: 6 days    Delma Post, PA-S [facility] 01/12/2016

## 2016-01-12 NOTE — Progress Notes (Signed)
[facility] Progress Note  Subjective: Tolerating clear liquids with NG tube to suction. Learned last night that her daughter was admitted to stepdown unit with COPD exacerbation, somewhat anxious about this.  Objective: Vital signs in last 24 hours: Temp:  [97.7 F (36.5 C)-97.9 F (36.6 C)] 97.7 F (36.5 C) (03/09 0525) Pulse Rate:  [97-111] 97 (03/09 0525) Resp:  [18] 18 (03/09 0525) BP: (108-143)/(73-83) 117/83 mmHg (03/09 0525) SpO2:  [98 %-100 %] 98 % (03/09 0525) Weight change:    PE: Alert, oriented, pleasant  Lab Results: Results for orders placed or performed during the hospital encounter of 01/06/16 (from the past 24 hour(s))  Glucose, capillary     Status: Abnormal   Collection Time: 01/11/16 12:30 PM  Result Value Ref Range   Glucose-Capillary 136 (H) 65 - 99 mg/dL  Glucose, capillary     Status: Abnormal   Collection Time: 01/11/16  6:19 PM  Result Value Ref Range   Glucose-Capillary 138 (H) 65 - 99 mg/dL   Comment 1 Notify RN   Glucose, capillary     Status: Abnormal   Collection Time: 01/11/16 11:42 PM  Result Value Ref Range   Glucose-Capillary 119 (H) 65 - 99 mg/dL  Comprehensive metabolic panel     Status: Abnormal   Collection Time: 01/12/16  4:42 AM  Result Value Ref Range   Sodium 138 135 - 145 mmol/L   Potassium 3.8 3.5 - 5.1 mmol/L   Chloride 109 101 - 111 mmol/L   CO2 23 22 - 32 mmol/L   Glucose, Bld 138 (H) 65 - 99 mg/dL   BUN 17 6 - 20 mg/dL   Creatinine, Ser 0.41 (L) 0.44 - 1.00 mg/dL   Calcium 8.3 (L) 8.9 - 10.3 mg/dL   Total Protein 4.6 (L) 6.5 - 8.1 g/dL   Albumin 2.6 (L) 3.5 - 5.0 g/dL   AST 23 15 - 41 U/L   ALT 28 14 - 54 U/L   Alkaline Phosphatase 33 (L) 38 - 126 U/L   Total Bilirubin 0.4 0.3 - 1.2 mg/dL   GFR calc non Af Amer >60 >60 mL/min   GFR calc Af Amer >60 >60 mL/min   Anion gap 6 5 - 15  Magnesium     Status: None   Collection Time: 01/12/16  4:42 AM  Result Value Ref Range   Magnesium 2.0 1.7 - 2.4 mg/dL   Phosphorus     Status: None   Collection Time: 01/12/16  4:42 AM  Result Value Ref Range   Phosphorus 4.0 2.5 - 4.6 mg/dL  Glucose, capillary     Status: Abnormal   Collection Time: 01/12/16  5:22 AM  Result Value Ref Range   Glucose-Capillary 108 (H) 65 - 99 mg/dL    Studies/Results: No results found.    Assessment: Gastric outlet obstruction presumed secondary to peptic ulcer disease/pyloric stenosis/inflammation, symptomatically improved  Plan: Continue PPI Remove NG tube when patient comfortable Advance to full liquid diet as tolerated Follow-up EGD, timing dependent on tolerance of advancement of diet. What a delightful patient!    Dsean Vantol C 01/12/2016, 7:09 AM  Pager [phone] If no answer or after 5 PM call [phone]

## 2016-01-12 NOTE — Progress Notes (Signed)
Date:  January 12, 2016 Chart reviewed for concurrent status and case management needs. Will continue to follow patient for changes and needs:  Sips of cl ligs only, ng tube to suction, iv flds and iv tpn Velva Harman, BSN, Therapist, sports, [place]   [phone]

## 2016-01-13 DIAGNOSIS — I4891 Unspecified atrial fibrillation: Secondary | ICD-10-CM

## 2016-01-13 DIAGNOSIS — I4892 Unspecified atrial flutter: Secondary | ICD-10-CM

## 2016-01-13 DIAGNOSIS — I480 Paroxysmal atrial fibrillation: Secondary | ICD-10-CM

## 2016-01-13 LAB — TSH: TSH: 1.427 u[IU]/mL (ref 0.350–4.500)

## 2016-01-13 LAB — GLUCOSE, CAPILLARY
GLUCOSE-CAPILLARY: 97 mg/dL (ref 65–99)
GLUCOSE-CAPILLARY: 125 mg/dL — AB (ref 65–99)
GLUCOSE-CAPILLARY: 124 mg/dL — AB (ref 65–99)
Glucose-Capillary: 120 mg/dL — ABNORMAL HIGH (ref 65–99)

## 2016-01-13 LAB — TROPONIN I: Troponin I: <0.03 ng/mL (ref <0.031)

## 2016-01-13 MED ORDER — TRACE MINERALS CR-CU-MN-SE-ZN 10-1000-500-60 MCG/ML IV SOLN
INTRAVENOUS | Status: DC
  Filled 2016-01-13: qty 1440

## 2016-01-13 MED ORDER — LIDOCAINE 5 % EX PTCH
1.0000 | MEDICATED_PATCH | Freq: Every day | CUTANEOUS | Status: DC
  Administered 2016-01-13 – 2016-01-15 (×4): 1 via TRANSDERMAL
  Filled 2016-01-13 (×6): qty 1

## 2016-01-13 MED ORDER — FOLIC ACID 1 MG PO TABS
1.0000 mg | ORAL_TABLET | Freq: Every day | ORAL | Status: DC
  Administered 2016-01-13 – 2016-01-16 (×4): 1 mg via ORAL
  Filled 2016-01-13 (×4): qty 1

## 2016-01-13 MED ORDER — OXYCODONE-ACETAMINOPHEN 7.5-325 MG PO TABS
1.0000 | ORAL_TABLET | Freq: Four times a day (QID) | ORAL | Status: DC | PRN
  Administered 2016-01-15: 1 via ORAL
  Filled 2016-01-13: qty 1

## 2016-01-13 MED ORDER — PANTOPRAZOLE SODIUM 40 MG PO TBEC
40.0000 mg | DELAYED_RELEASE_TABLET | Freq: Two times a day (BID) | ORAL | Status: DC
  Administered 2016-01-13 – 2016-01-16 (×6): 40 mg via ORAL
  Filled 2016-01-13 (×8): qty 1

## 2016-01-13 MED ORDER — FAT EMULSION 20 % IV EMUL
192.0000 mL | INTRAVENOUS | Status: DC
  Filled 2016-01-13: qty 250

## 2016-01-13 MED ORDER — INSULIN ASPART 100 UNIT/ML ~~LOC~~ SOLN
0.0000 [IU] | Freq: Three times a day (TID) | SUBCUTANEOUS | Status: DC
  Administered 2016-01-13: 1 [IU] via SUBCUTANEOUS

## 2016-01-13 MED ORDER — FESOTERODINE FUMARATE ER 8 MG PO TB24
8.0000 mg | ORAL_TABLET | Freq: Every day | ORAL | Status: DC
  Administered 2016-01-13 – 2016-01-16 (×4): 8 mg via ORAL
  Filled 2016-01-13 (×6): qty 1

## 2016-01-13 MED ORDER — PREDNISONE 5 MG PO TABS
5.0000 mg | ORAL_TABLET | Freq: Every day | ORAL | Status: DC
  Administered 2016-01-14 – 2016-01-16 (×3): 5 mg via ORAL
  Filled 2016-01-13 (×5): qty 1

## 2016-01-13 MED ORDER — CLOPIDOGREL BISULFATE 75 MG PO TABS
75.0000 mg | ORAL_TABLET | Freq: Every day | ORAL | Status: DC
  Administered 2016-01-13 – 2016-01-14 (×2): 75 mg via ORAL
  Filled 2016-01-13 (×2): qty 1

## 2016-01-13 MED ORDER — SIMVASTATIN 40 MG PO TABS
40.0000 mg | ORAL_TABLET | Freq: Every day | ORAL | Status: DC
  Administered 2016-01-13 – 2016-01-15 (×3): 40 mg via ORAL
  Filled 2016-01-13 (×4): qty 1

## 2016-01-13 NOTE — Progress Notes (Addendum)
[facility] PROGRESS NOTE  Angela [name] [id number] DOB: 1936/09/25 DOA: 01/06/2016 PCP: Irven Shelling, MD  Brief Summary  The patient is a 80 year old female with history of coronary artery disease status post PCI, hypertension, hyperlipidemia, rheumatoid arthritis, peptic ulcer disease, aortic stenosis who was sent to the hospital by [facility], Dr. Wynetta Emery, for gastric outlet obstruction after an endoscopy on 3/4 demonstrated evidence of gastric outlet obstruction near the pylorus due to a mass or an ulcer with scarring. An NG tube was placed to suction and the patient was started on TPN. Initially there was thought that she may need surgery however, at this point the surgeons are hoping for resolution with conservative measures.  Assessment/Plan  Gastric outlet obstruction due to duodenitis with polypoid lesion in pyloric channel and surrounding ulcer and edema, biopsy negative for malignancy.  "Gastroparesis" was likely secondary to gastric outlet obstruction.  Not a diabetic.   -  Appreciate GI and general surgery assistance -  NG tube removed  -  General surgery recommending a mostly FLD for the next 2 weeks with a small amount of soft food -  D/c TPN today - CT abdomen and pelviswith no evidence of masses or malignancy -  Continue PPI -  Plan for outpatient EGD in 4-8 weeks -  Ambulate/OOB  Rheumatoid arthritis  - change to PO steroid - resume methotrexate tomorrow   Coronary artery disease - Denies any chest pain or shortness of breath,  -Resume plavix.  BB resumed last night  History of PVC, bradycardia, and atrial tachycardia and paroxysmal atrial fibrillation.  Went into a-flutter with RVR to 140s-150s last night.  CHADs2vasc = 4.  Last a-fib was related to severe illness/ICU stay several years ago, cardioverted with amiodarone, and per cardiology notes, no longer active problem.  Has had several outpatient monitors with no a-fib.   -   During ICU stay  several years ago, she was not felt to be a good candidate for anticoagulation, however, now that she is somewhat healthier, may want to reconsider.  -  Cardiology consultation -  Check troponins, TSH, and repeat ECHO since she has not been in a-fib/flutter for many years -  HR improved after resuming BB  Hyperlipidemia - Resume statin  Diarrhea - C diff negative  Severe protein calorie malnutrition - taper off TPN as diet has been advanced  Deconditioned, PT evaluation  Diet:  soft Access:  PICC right arm IVF:  off Proph:  heparin  Code Status: full Family Communication: patient and her caretaker Disposition Plan:  Pending ECHO, troponins, HR stable, likely home tomorrow with her caretaker and [facility] services   Procedures  EGD 01/07/2016    Consults  Gastroenterology  General surgery  Cardiology  HPI/Subjective:  Developed sudden onset palpitations last night around 6PM.  When RN assessed, she was tachycardic and irregular.  Denied chest pains, SOB, diaphoresis, and nausea with event.  She had some mild lightheadedness when getting up to attempt ambulation today, but otherwise has felt well.  Denies abdominal pain, bloating.  + flatus.    Objective: Filed Vitals:   01/12/16 1400 01/12/16 2039 01/13/16 0550 01/13/16 1343  BP: 92/65 113/74 107/52 96/64  Pulse: 99 114 56 93  Temp: 98.2 F (36.8 C) 97.4 F (36.3 C) 97.8 F (36.6 C) 98.2 F (36.8 C)  TempSrc: Oral Oral Oral Oral  Resp: 22 20 18 16   Height:      Weight:      SpO2: 99% 98% 100%  99%    Intake/Output Summary (Last 24 hours) at 01/13/16 1430 Last data filed at 01/13/16 0600  Gross per 24 hour  Intake   1681 ml  Output      0 ml  Net   1681 ml   Filed Weights   01/07/16 0600  Weight: 53.071 kg (117 lb)   Body mass index is 21.39 kg/(m^2).  Exam:   General:  Adult female, No acute distress  HEENT:  NCAT, MMM, NG removed  Cardiovascular:  IRRR, 2+ pulses, warm extremities  Respiratory:   CTAB, no increased WOB  Abdomen:   NABS, soft, NT/ND  MSK:   Decreased tone and bulk, no LEE, hands have ulnar deviation of MCP joints with joint swelling at elbows, wrists, and PIP joints with limited ROM and some decreased muscle bulk  Neuro:  Diffusely weak secondary to joint deformities/RA  Data Reviewed: Basic Metabolic Panel:  Recent Labs Lab 01/07/16 0627 01/08/16 0526 01/09/16 0515 01/10/16 0430 01/12/16 0442  NA 139 138 138 134* 138  K 4.0 3.1* 3.2* 4.3 3.8  CL 107 109 111 107 109  CO2 21* 18* 23 23 23   GLUCOSE 76 144* 146* 203* 138*  BUN 12 10 9 12 17   CREATININE 0.63 0.59 0.39* 0.48 0.41*  CALCIUM 8.2* 7.8* 8.1* 8.0* 8.3*  MG  --  2.1 1.9 2.2 2.0  PHOS  --  2.9 2.1* 2.7 4.0   Liver Function Tests:  Recent Labs Lab 01/07/16 0627 01/08/16 0526 01/09/16 0515 01/12/16 0442  AST 22 18 17 23   ALT 24 19 17 28   ALKPHOS 38 36* 33* 33*  BILITOT 1.2 1.2 0.3 0.4  PROT 5.0* 4.9* 4.4* 4.6*  ALBUMIN 3.1* 2.9* 2.6* 2.6*   No results for input(s): LIPASE, AMYLASE in the last 168 hours. No results for input(s): AMMONIA in the last 168 hours. CBC:  Recent Labs Lab 01/08/16 0526 01/09/16 0515  WBC 5.5 5.4  NEUTROABS 3.4 3.4  HGB 12.5 12.1  HCT 37.6 35.5*  MCV 107.7* 106.6*  PLT 149* 159    Recent Results (from the past 240 hour(s))  C difficile quick scan w PCR reflex     Status: None   Collection Time: 01/10/16 12:01 AM  Result Value Ref Range Status   C Diff antigen NEGATIVE NEGATIVE Final   C Diff toxin NEGATIVE NEGATIVE Final   C Diff interpretation Negative for toxigenic C. difficile  Final     Studies: No results found.  Scheduled Meds: . acebutolol  200 mg Oral BID  . clopidogrel  75 mg Oral Daily  . fluticasone  1 spray Each Nare Daily  . folic acid  1 mg Oral Daily  . heparin  5,000 Units Subcutaneous 3 times per day  . insulin aspart  0-9 Units Subcutaneous TID WC  . lidocaine  1 patch Transdermal QHS  . LORazepam  1 mg Intravenous QHS   . methylPREDNISolone (SOLU-MEDROL) injection  20 mg Intravenous Daily  . nystatin cream   Topical BID  . pantoprazole  40 mg Oral BID AC  . sodium chloride flush  10-40 mL Intracatheter Q12H   Continuous Infusions: . Marland KitchenTPN (CLINIMIX-E) Adult 60 mL/hr at 01/12/16 1806   And  . fat emulsion 240 mL (01/12/16 1807)  . sodium chloride 10 mL/hr at 01/09/16 1828    Active Problems:   Rheumatoid arthritis ([place])   Dyslipidemia   Essential hypertension, benign   Gastric outlet obstruction   Gastroparesis   Protein-calorie malnutrition,  severe    Time spent: 30 min    Tabetha Haraway, [facility] Pager [phone]. If 7PM-7AM, please contact night-coverage at [email or web address], password [facility] 01/13/2016, 2:30 PM  LOS: 7 days

## 2016-01-13 NOTE — Progress Notes (Cosign Needed)
6 Days Post-Op  Subjective: Pt tolerating full liquids w/o N/V or abdominal pain. +flatus and 1 BM this AM. Converted into Aflutter last night which resolved with Acebutolol and Lopressor. Denies palpitations this morning.  Objective: Vital signs in last 24 hours: Temp:  [97.4 F (36.3 C)-98.2 F (36.8 C)] 97.8 F (36.6 C) (03/10 0550) Pulse Rate:  [56-114] 56 (03/10 0550) Resp:  [18-22] 18 (03/10 0550) BP: (92-114)/(52-74) 107/52 mmHg (03/10 0550) SpO2:  [97 %-100 %] 100 % (03/10 0550) Last BM Date: 01/11/16  Diet: full liquids BM x1 Afebrile, VSS Protein, albumin, sCr, calcium, Alk Phos all low  Intake/Output from previous day: 03/09 0701 - 03/10 0700 In: 3081 [P.O.:1400; I.V.:240; TPN:1441] Out: -  Intake/Output this shift:    Gen: alert, pleasant, NAD Card: RRR Pulm: audible breath sounds bilaterally Abd: soft, NTND to light and deep palpation, +BS  Lab Results:  No results for input(s): WBC, HGB, HCT, PLT in the last 72 hours. BMET  Recent Labs  01/12/16 0442  NA 138  K 3.8  CL 109  CO2 23  GLUCOSE 138*  BUN 17  CREATININE 0.41*  CALCIUM 8.3*   PT/INR No results for input(s): LABPROT, INR in the last 72 hours. ABG No results for input(s): PHART, HCO3 in the last 72 hours.  Invalid input(s): PCO2, PO2  Studies/Results: No results found.  Anti-infectives: Anti-infectives    None      Assessment/Plan: Gastric Outlet Obstruction s/p EGD 01/07/16 Dr. Michail Sermon 1. Continue IVF, TPN with full liquid diet, consider advancing to soft diet 2. Continue PPI per GI 3. Plan for outpatient EGD in 4-8 weeks if able to tolerate advances of diet, if not, will need to repeat EGD  -Abdominal/pelvic CT  (01/10/16)and Bx showed no evidence of malignancy  4. PT eval, continue to ambulate 5. Continue cardiac monitoring per Cardiology  Rheumatoid arthritis  1. Continue Solumedrol, hold methotrexate  Coronary artery disease History of PVC, bradycardia, and atrial  tachycardia Paroxysmal atrial fibrillation Hyperlipidemia Diarrhea Severe protein calorie malnutrition   DVT proph: IV heparin   Disposition Plan: home pending ability to tolerate diet advances, see above for outpatient EGD plan    LOS: 7 days    Delma Post, PA-S [facility] 01/13/2016

## 2016-01-13 NOTE — Care Management Important Message (Signed)
Important Message  Patient Details  Name: MAHKENZIE LYDEN MRN: [id number] Date of Birth: 1936/07/06   Medicare Important Message Given:  Yes    Camillo Flaming 01/13/2016, 2:21 [place] Message  Patient Details  Name: ANALAYAH LAMKINS MRN: [id number] Date of Birth: [birth year]   Medicare Important Message Given:  Yes    Camillo Flaming 01/13/2016, 2:21 PM

## 2016-01-13 NOTE — Progress Notes (Addendum)
[place] NOTE  Pharmacy Consult for TPN Indication: Gastric outlet obstruction  Allergies  Allergen Reactions  . Peanut-Containing Drug Products Anaphylaxis  . Reglan [Metoclopramide] Other (See Comments)    "Messed my mind up"  . Codeine Nausea Only  . Iohexol Swelling     Desc: pt received hypaque 60% in 1994 and had erythema, hives & lips swell.  She did ok w/o premeds in 8/05 at [facility].  Pt. takes daily prednisone for rheumatoid arthritis.  Poor venous access today (7/7/6) so we did her w/o iv contrast.   . Pineapple Swelling  . Shellfish Allergy Swelling    Lips swell   Patient Measurements: Height: 5\' 2"  (157.5 cm) Weight: 117 lb (53.071 kg) IBW/kg (Calculated) : 50.1 Adjusted Body Weight:  Usual Weight:   Vital Signs: Temp: 97.8 F (36.6 C) (03/10 0550) Temp Source: Oral (03/10 0550) BP: 107/52 mmHg (03/10 0550) Pulse Rate: 56 (03/10 0550) Intake/Output from previous day: 03/09 0701 - 03/10 0700 In: 3081 [P.O.:1400; I.V.:240; [id number] Out: -  Intake/Output from this shift:    Labs: No results for input(s): WBC, HGB, HCT, PLT, APTT, INR in the last 72 hours.  Recent Labs  01/12/16 0442  NA 138  K 3.8  CL 109  CO2 23  GLUCOSE 138*  BUN 17  CREATININE 0.41*  CALCIUM 8.3*  MG 2.0  PHOS 4.0  PROT 4.6*  ALBUMIN 2.6*  AST 23  ALT 28  ALKPHOS 33*  BILITOT 0.4   Estimated Creatinine Clearance: 46.6 mL/min (by C-G formula based on Cr of 0.41).    Recent Labs  01/12/16 1735 01/12/16 2347 01/13/16 0559  GLUCAP 170* 111* 97    Medical History: Past Medical History  Diagnosis Date  . Cervical stenosis of spine   . Spondylolisthesis of lumbar region   . A-fib ([place])   . Asthma   . Depression   . Dyslipidemia   . GERD (gastroesophageal reflux disease)   . Obesity   . PVC's (premature ventricular contractions)   . PUD (peptic ulcer disease)     can not take aspirin  . Osteoporosis     alendronate since 2012  . Macular  degeneration   . Aortic stenosis     mild by echo 03/2012  . Hypertension   . CAD (coronary artery disease)     s/p PCI with BMS to mid LAD--2/08 not on aspirin due to frequent ulcers.  Repeat cath for CP 09/2012 with patent LAD stent and otherwise normal coronary arteries  . Elbow fracture, right     history of- never any surgery  . Rheumatoid arthritis(714.0)     Dr. Lenna Gilford follows  . Impaired physical mobility     due to rheumatoid arthritis "ambulates with walker" limited free standing.   Medications:  Scheduled:  . acebutolol  200 mg Oral BID  . fluticasone  1 spray Each Nare Daily  . heparin  5,000 Units Subcutaneous 3 times per day  . insulin aspart  0-9 Units Subcutaneous Q6H  . lidocaine  1 patch Transdermal QHS  . LORazepam  1 mg Intravenous QHS  . methylPREDNISolone (SOLU-MEDROL) injection  20 mg Intravenous Daily  . nystatin cream   Topical BID  . pantoprazole (PROTONIX) IV  40 mg Intravenous Q12H  . sodium chloride flush  10-40 mL Intracatheter Q12H   Infusions:  . Marland KitchenTPN (CLINIMIX-E) Adult 60 mL/hr at 01/12/16 1806   And  . fat emulsion 240 mL (01/12/16 1807)  . sodium chloride  10 mL/hr at 01/09/16 1828   Insulin Requirements: sensitive SSI q6h (2 units over the past 24 hrs) - on solu-medrol  Current Nutrition: NPO--> Fulls liquid started on 3/9  IVF: NS at 10 ml/hr  Central access: 3/4 TPN start date: 3/5  ASSESSMENT                                                                                                          HPI: 26 yoF with recurrent abd pain since June 2016. Complaint of severe nausea, heartburn, loose stools, 20 lb weight loss in 3 months.  EGD 12/16 with large amount of retained food.  EGD 3/4: Gastric Outlet Obstruction with polypoid lesion, ulcer,edema, gastritis. Possible gastrojejunostomy, await pathology report.  Significant events:  3/4: EGD with biopsy of ulcerated polypoid lesions 3/7: Abd CT- no source of gastric obstruction  identified; clamping NGT 3/8: GI recom to adv diet slowly and proceed with EGD if patient does not tolerate 3/9: tolerating clear liquids-- she reported eating ~75% of her food 3/10: Did well with clear liquids, had first full liquid tray this am and ate 50%. GI plans to keep full liquids today and to advance to soft tomorrow as tolerates  Today:   Glucose (goal <150): all readings<150 (on solu-medrol)  Electrolytes:  3/9 - WNL  Renal: scr low  LFTs: wnl  TGs: 93 (3/5), 81 (3/6)  Prealbumin - 11.1 (3/5 on steroids, will artificially elevate level), 11.4 (3/6)  NUTRITIONAL GOALS                                                                                             RD recs:  Kcal: 1350-1550 Protein: 65-75 grams Fluid: 2 L/day  Clinimix / at a goal rate of 60 ml/hr + 20% fat emulsion at 8 ml/hr to provide: 72 g/day protein, 1402 Kcal/day   PLAN                                                                                                                          - At 1800 today:  continue Clinimix E 5/15 to goal rate 60 ml/hr, if continues to tolerate fulls and  diet advanced would recommend wean TPN with 3/11 TPN  Decrease 20% fat emulsion to 26ml/hr to reduce the percent of kcal from lipids (ideally like to keep <= 30%, this change will reduce from 40% to 35%).  Patient will still meet Kcal goals from TPN/lipids  TPN to contain standard multivitamins and trace elements.  continue IVF to [facility]  continue SSI Novolog sensitive scale q6hr  TPN lab panels on Mondays & Thursdays.  F/u daily.  Doreene Eland, PharmD, BCPS.   Pager: [phone] 01/13/2016 7:53 AM   Addendum: - Spoke with Dr. Sheran Fava - plan to stop TPN tonight so reduce rate to 38ml/hr and d/c TPN at 18:00 - D/C TPN labs  Doreene Eland, PharmD, BCPS.   Pager: [phone] 01/13/2016 1:30 PM

## 2016-01-13 NOTE — Progress Notes (Signed)
Nutrition Follow-up  DOCUMENTATION CODES:   Severe malnutrition in context of acute illness/injury  INTERVENTION:  - Continue TPN/begin TPN wean per pharmacy and orders - Continue FLD and advance diet as medically feasible - RD will continue to monitor for additional needs  NUTRITION DIAGNOSIS:   Inadequate oral intake related to inability to eat as evidenced by NPO status. -resolving with advancement to FLD  GOAL:   Patient will meet greater than or equal to 90% of their needs -met with TPN alone  MONITOR:   Diet advancement, PO intake, Weight trends, Labs, Skin, I & O's, Other (Comment) (TPN regimen)  ASSESSMENT:   80 y.o. female, with past medical history of coronary artery disease status post PCI, hypertension, hyperlipidemia, rheumatoid arthritis, peptic ulcer disease, aortic stenosis, patient was sent by Dr. Nor[facility] Levo [facility] for abnormal upper GI series, she reports she saw Dr. Wynetta Emery yesterday, ordered upper GI series for her, results were abnormal, where she had contrast in stomach, did not pass through, soshe was instructed to come to ED, she denies any abdominal pain, fever, chills, or vomiting, but she has complaints of nausea for few month, weight loss, and diarrhea over last few days as she's been receiving oral antibiotic for tooth infection, patient had EGD in December 20suspicious for severe gastroparesis(EGD incomplete as pylorus could not be located),   3/10 NGT out since last assessment. Pt continues with Clinimix E 5/15 @ 60 mL/hr with 20% lipids @ 10 mL/hr. Diet advanced from CLD to [place] yesterday (3/9) @ 1550. Per chart review, pt consumed 85% of breakfast, 75% of lunch, and 85% of dinner on CLD 3/8. Pt consumed 50% of FLD tray for breakfast this AM. She denies any abdominal discomfort or nausea with diet advancement and states that she is feeling better/stronger every day.   Will monitor for ability to further advance diet and wean TPN. Meeting needs currently.  Medications reviewed. Labs reviewed; CBGs: 97-170 mg/dL, creatinine low and trending down, Ca: 8.3 mg/dL.    3/8 - Pt receiving Clinimix E 5/15 @ 60 mL/hr with 20% lipids @ 10 mL/hr (goal rate) at this time which is providing 72 grams of protein and 1502 kcal; this fully meets nutrition needs.  - NGT remains in place but has been clamped since yesterday (3/7).  - Pt denies abdominal pain/pressure or nausea with clamping.  - She is permitted 30QT CLD every 1 hour and states that she has been tolerating sips of juice and water.  - Pt states that PA student this AM informed her she may be able to have some broth by dinner-time today. - Per GI note this AM at 0939:   Gastric outlet obstruction, presumed benign etiology, evidence of improvement             clinically and by CT scan. Discontinue NG tube when decided by surgery. Advance         diet slowly. If does not tolerate reasonable advancement of diet, repeat EGD with        consideration of repeat biopsy and/or balloon dilatation. If is able to advance diet          without undue symptomatology, would still repeat EGD in 4-8 weeks. - RD will continue to monitor diet advancement and related nutrition needs.    3/6 - Pt receiving Clinimix E 5/15 @ 40 mL/hr with 20% lipids @ 10 mL/hr which is providing 48 grams of protein, 1162 kcal and this regimen was initiated on 01/07/16.  -  Plan per pharmacy is for goal of Clinimix E 5/15 @ 80 mL/hr with 20% lipids @ 10 mL/hr which will provide 96 grams of protein, 1843 kcal. Current kcal and protein needs based on medical hx and current dx.  - Needs will be adjusted should malignancy be found.  - Recommend new goal of Clinimix E 5/15 @ 60 mL/hr with 20% lipids @ 10 mL/hr which will provide 72 grams of protein, 1502 kcal.  - Pt with NGT to suction with 500cc drainage present at time of RD visit.  - Pt had EGD 3/4 showing: Gastric Outlet Obstruction - polypoid lesion noted with surrounding ulcer and edema  - s/p biopsies. Gastritis (distal stomach).  - Per GI note following procedure, pt may require gastrojejunostomy tube; RD will provide TF recommendations should this be placed and if pt to transition to TF from TPN.  - Pt reports that last PO of solid food and liquids was Thursday (3/2) PM.  - She states that for the 5-6 months PTA she was progressively experiencing abdominal pain and nausea with PO intakes and during that time was also progressively unable to consume adequate intakes (intakes were less than usual). - She states that in the past 2 months, related to this, she has lost 20 lbs. - This would indicate 14.5% wt loss in this time frame which is significant.  - Chart review shows 6 lb weight loss (5% wt loss) in the past 1 month which is significant for time frame.  - Physical assessment shows moderate muscle and moderate fat wasting to upper and lower body.  - Pt states relief in abdominal discomfort and nausea this AM.  - She also states she is feeling hungry and is looking forward to when she is able to consume PO.  - She states she has had a few ice chips today.     Diet Order:  .TPN (CLINIMIX-E) Adult Diet full liquid Room service appropriate?: Yes; Fluid consistency:: Thin  Skin:  Reviewed, no issues  Last BM:  3/8  Height:   Ht Readings from Last 1 Encounters:  01/07/16 '5\' 2"'  (6.644 m)    Weight:   Wt Readings from Last 1 Encounters:  01/07/16 117 lb (53.071 kg)    Ideal Body Weight:  50 kg (kg)  BMI:  Body mass index is 21.39 kg/(m^2).  Estimated Nutritional Needs:   Kcal:  1350-1550  Protein:  65-75 grams  Fluid:  2 L/day  EDUCATION NEEDS:   No education needs identified at this time     Jarome Matin, RD, LDN Inpatient Clinical Dietitian Pager # [phone] After hours/weekend pager # [phone]

## 2016-01-13 NOTE — Consult Note (Signed)
CARDIOLOGY CONSULT NOTE   Patient ID: Angela [name] MRN: [id number] DOB/AGE: 1935-12-17 80 y.o.  Admit date: 01/06/2016  Consulting Physician :Dr. Sheran Fava Primary Physician   Irven Shelling, MD Primary Cardiologist   Dr. Radford Pax Reason for Consultation  atrial fib/ flutter.   HPI: Angela [name] is a 80 y.o. female with a history of CAD, s/p prior PCI to the LAD, HLD, HTN, PVCs, rheumatoid arthritis, peptic ulcer disease, previous gastroparesis and mild aortic stenosis who presented to [facility] on 01/06/16 for gastric outlet obstruction. Hospital admission complicated by atrial fib/ flutter and cardiology consulted.   She was seen for bradycardia and Holter in 8/15 showed NSR with PACs and PVCs and ATach. Toprol was changed to Acebutolol. Bradycardia resolved. Last seen by Dr. Fransico Him in 4/16. Prior echo suggested mod AS. Repeat limited echo demonstrated just mild AS.   She was last seen in our office by Richardson Dopp PA-C on 12/07/15 for LE edema. He suspected her edema was multifactorial and related to venous insufficiency, inactivity, diastolic dysfunction, hypoalbuminemia.   She has had abdominal pain and poor PO intake over the last few months. UGIS showed gastric outlet obstruction. She was thought to have gastroparesis based on her incomplete EGD. EGD in 2011 (Dr. Wynetta Emery) showed retained food in the fundus and a normal antrum, pylorus and duodenal bulb. She is now s/p EGD 01/07/16 Dr. Michail Sermon.  She went into atrial flutter last night and has sustained this into today. She says that she has history of this but hasn't had it in quite some time. Two last outpatient cardiology notes do not mention afib or flutter. Per IM note, she did have afib during an ICU stay several years ago and cardioverted on amiodarone. She was felt to not be a good anticoagulation candidate due to falls. She has not had issues with it since.  Currently, she has no chest pain or SOB. No LE edema, orthopnea  or PND. No dizziness or syncope.    Past Medical History  Diagnosis Date  . Cervical stenosis of spine   . Spondylolisthesis of lumbar region   . A-fib ([place])   . Asthma   . Depression   . Dyslipidemia   . GERD (gastroesophageal reflux disease)   . Obesity   . PVC's (premature ventricular contractions)   . PUD (peptic ulcer disease)     can not take aspirin  . Osteoporosis     alendronate since 2012  . Macular degeneration   . Aortic stenosis     mild by echo 03/2012  . Hypertension   . CAD (coronary artery disease)     s/p PCI with BMS to mid LAD--2/08 not on aspirin due to frequent ulcers.  Repeat cath for CP 09/2012 with patent LAD stent and otherwise normal coronary arteries  . Elbow fracture, right     history of- never any surgery  . Rheumatoid arthritis(714.0)     Dr. Lenna Gilford follows  . Impaired physical mobility     due to rheumatoid arthritis "ambulates with walker" limited free standing.     Past Surgical History  Procedure Laterality Date  . Fracture surgery  09/2011    left ankle screw and pin removed  . Cardiac catheterization  09/2012    patent LAD stent and otherwise normal coronary arteries  . Cervical fusion      limited  ROM  . Tonsillectomy    . Cataract extraction, bilateral Bilateral   . Joint replacement  Bilateral     BTKA  . Hand surgery Bilateral     x4 digits 2-5 both hands  . Ankle fusion Bilateral     x2 both  . Cholecystectomy    . Esophagogastroduodenoscopy (egd) with propofol N/A 11/01/2015    Procedure: ESOPHAGOGASTRODUODENOSCOPY (EGD) WITH PROPOFOL w/ Dialtation;  Surgeon: Garlan Fair, MD;  Location: [facility]COPY;  Service: Endoscopy;  Laterality: N/A;  . Esophagogastroduodenoscopy N/A 01/07/2016    Procedure: ESOPHAGOGASTRODUODENOSCOPY (EGD);  Surgeon: Wilford Corner, MD;  Location: [facility];  Service: Endoscopy;  Laterality: N/A;    Allergies  Allergen Reactions  . Peanut-Containing Drug Products Anaphylaxis  . Reglan  [Metoclopramide] Other (See Comments)    "Messed my mind up"  . Codeine Nausea Only  . Iohexol Swelling     Desc: pt received hypaque 60% in 1994 and had erythema, hives & lips swell.  She did ok w/o premeds in 8/05 at [facility].  Pt. takes daily prednisone for rheumatoid arthritis.  Poor venous access today (7/7/6) so we did her w/o iv contrast.   . Pineapple Swelling  . Shellfish Allergy Swelling    Lips swell    I have reviewed the patient's current medications . acebutolol  200 mg Oral BID  . clopidogrel  75 mg Oral Daily  . fluticasone  1 spray Each Nare Daily  . folic acid  1 mg Oral Daily  . heparin  5,000 Units Subcutaneous 3 times per day  . insulin aspart  0-9 Units Subcutaneous TID WC  . lidocaine  1 patch Transdermal QHS  . LORazepam  1 mg Intravenous QHS  . methylPREDNISolone (SOLU-MEDROL) injection  20 mg Intravenous Daily  . nystatin cream   Topical BID  . pantoprazole  40 mg Oral BID AC  . sodium chloride flush  10-40 mL Intracatheter Q12H   . Marland KitchenTPN (CLINIMIX-E) Adult 60 mL/hr at 01/12/16 1806   And  . fat emulsion 240 mL (01/12/16 1807)  . sodium chloride 10 mL/hr at 01/09/16 1828   albuterol, LORazepam, menthol-cetylpyridinium, oxyCODONE-acetaminophen, sodium chloride flush  Prior to Admission medications   Medication Sig Start Date End Date Taking? Authorizing Provider  acebutolol (SECTRAL) 200 MG capsule TAKE 1 CAPSULE (200 MG TOTAL) BY MOUTH 2 (TWO) TIMES DAILY. 11/24/15  Yes Sueanne Margarita, MD  Calcium Carbonate-Vitamin D (CALCIUM 500+D PO) Take 1 tablet by mouth 3 (three) times daily.   Yes Historical Provider, MD  clopidogrel (PLAVIX) 75 MG tablet TAKE 1 TABLET (75 MG TOTAL) BY MOUTH EVERY EVENING. 10/24/15  Yes Sueanne Margarita, MD  diclofenac sodium (VOLTAREN) 1 % GEL Apply 2 g topically 2 (two) times daily as needed. For pain Patient taking differently: Apply 2 g topically 2 (two) times daily. For pain 10/19/15  Yes Meredith Staggers, MD  Docusate Calcium  (STOOL SOFTENER PO) Take 1 tablet by mouth at bedtime.   Yes Historical Provider, MD  folic acid (FOLVITE) 1 MG tablet Take 1 mg by mouth daily.  12/03/13  Yes Historical Provider, MD  furosemide (LASIX) 40 MG tablet Take 1 tablet (40 mg total) by mouth daily. You may take extra 1/2 tablet on days when you have extra swelling Patient taking differently: Take 40 mg by mouth daily.  12/07/15  Yes Scott T Weaver, PA-C  lidocaine (LIDODERM) 5 % APPLY 1 PATCH ONTO THE SKIN DAILY REMOVE AND DISCARD PATCH WITHIN 12 HOURS OR AS DIRECTED BY PRESCRIBER Patient taking differently: Place 1 patch onto the skin daily. APPLY NECK  AND ELBOW 10/19/15  Yes Meredith Staggers, MD  LORazepam (ATIVAN) 2 MG tablet Take 2 mg by mouth at bedtime. 02/05/15  Yes Historical Provider, MD  mometasone (NASONEX) 50 MCG/ACT nasal spray Place 2 sprays into the nose daily as needed (nasal congestion).   Yes Historical Provider, MD  nystatin (MYCOSTATIN) 100000 UNIT/ML suspension Take 5 mLs by mouth 4 (four) times daily.   Yes Historical Provider, MD  nystatin cream (MYCOSTATIN) Apply 1 application topically daily as needed for dry skin.   Yes Historical Provider, MD  oxyCODONE-acetaminophen (PERCOCET) 7.5-325 MG tablet Take 1 tablet by mouth every 6 (six) hours as needed. Patient taking differently: Take 1 tablet by mouth every 6 (six) hours as needed for moderate pain.  10/19/15  Yes Meredith Staggers, MD  pantoprazole (PROTONIX) 40 MG tablet Take 40 mg by mouth 2 (two) times daily.    Yes Historical Provider, MD  predniSONE (DELTASONE) 5 MG tablet Take 5 mg by mouth daily.   Yes Historical Provider, MD  PREVIDENT 0.2 % SOLN Take 1 each by mouth at bedtime. Rinses with about 1 capful nightly 09/05/12  Yes Historical Provider, MD  simvastatin (ZOCOR) 40 MG tablet TAKE 1 TABLET BY MOUTH AT BEDTIME 12/09/15  Yes Sueanne Margarita, MD  TOVIAZ 8 MG TB24 tablet Take 8 mg by mouth daily. 10/25/15  Yes Historical Provider, MD    butalbital-acetaminophen-caffeine (FIORICET, ESGIC) 50-325-40 MG per tablet Take 1 tablet by mouth every 6 (six) hours as needed for headache or migraine. For migraines 06/23/12   Historical Provider, MD  EPIPEN 2-PAK 0.3 MG/0.3ML SOAJ Inject 0.3 mg into the muscle daily as needed (for allergic reaction).  03/12/13   Historical Provider, MD  fluticasone (FLONASE) 50 MCG/ACT nasal spray Place 1 spray into both nostrils daily as needed for allergies.  12/01/14   Historical Provider, MD  methotrexate (RHEUMATREX) 2.5 MG tablet Take 15 mg by mouth once a week. On Saturdays 03/01/12   Historical Provider, MD  NITROSTAT 0.4 MG SL tablet Place 0.4 mg under the tongue every 5 (five) minutes as needed for chest pain.  09/16/12   Historical Provider, MD     Social History   Social History  . Marital Status: Widowed    Spouse Name: N/A  . Number of Children: N/A  . Years of Education: N/A   Occupational History  . Not on file.   Social History Main Topics  . Smoking status: Never Smoker   . Smokeless tobacco: Never Used  . Alcohol Use: No  . Drug Use: No  . Sexual Activity: Not on file   Other Topics Concern  . Not on file   Social History Narrative    Family Status  Relation Status Death Age  . Mother Deceased   . Father Deceased 23    MVA  . Brother Deceased    Family History  Problem Relation Age of Onset  . Lung cancer Mother   . Melanoma Brother   . Arrhythmia Mother   . Heart attack Neg Hx      ROS:  Full 14 point review of systems complete and found to be negative unless listed above.  Physical Exam: Blood pressure 96/64, pulse 93, temperature 98.2 F (36.8 C), temperature source Oral, resp. rate 16, height 5\' 2"  (1.575 m), weight 117 lb (53.071 kg), SpO2 99 %.  General: Well developed, well nourished, female in no acute distress Head: Eyes PERRLA, No xanthomas.   Normocephalic and atraumatic, oropharynx  without edema or exudate.  Lungs: CTAB Heart: irreg irreg S1 S2, no  rub/gallop, Heart irregular rate and rhythm with S1, S2  murmur. pulses are 2+ extrem.   Neck: No carotid bruits. No lymphadenopathy. No  JVD. Abdomen: Bowel sounds present, abdomen soft and non-tender without masses or hernias noted. Msk:  No spine or cva tenderness. No weakness, no joint deformities or effusions. Extremities: No clubbing or cyanosis.  No LE edema.  Neuro: Alert and oriented X 3. No focal deficits noted. Psych:  Good affect, responds appropriately Skin: No rashes or lesions noted.  Labs:   Lab Results  Component Value Date   WBC 5.4 01/09/2016   HGB 12.1 01/09/2016   HCT 35.5* 01/09/2016   MCV 106.6* 01/09/2016   PLT 159 01/09/2016   No results for input(s): INR in the last 72 hours.  Recent Labs Lab 01/12/16 0442  NA 138  K 3.8  CL 109  CO2 23  BUN 17  CREATININE 0.41*  CALCIUM 8.3*  PROT 4.6*  BILITOT 0.4  ALKPHOS 33*  ALT 28  AST 23  GLUCOSE 138*  ALBUMIN 2.6*   MAGNESIUM  Date Value Ref Range Status  01/12/2016 2.0 1.7 - 2.4 mg/dL Final    Recent Labs  01/13/16 1300  TROPONINI <0.03   No results for input(s): TROPIPOC in the last 72 hours. PRO B NATRIURETIC PEPTIDE (BNP)  Date/Time Value Ref Range Status  01/11/2014 04:29 PM 452.0* 0.0 - 100.0 pg/mL Final   Lab Results  Component Value Date   CHOL 129 02/24/2015   HDL 58.80 02/24/2015   LDLCALC 53 02/24/2015   TRIG 81 01/09/2016    LIPASE  Date/Time Value Ref Range Status  01/06/2016 01:56 PM 36 11 - 51 U/L Final   TSH  Date/Time Value Ref Range Status  12/07/2015 04:29 PM 1.684 0.350 - 4.500 uIU/mL Final   No results found for: VITAMINB12, FOLATE, FERRITIN, TIBC, IRON, RETICCTPCT  Echo: 03/09/2015 Study Conclusions - Left ventricle: The cavity size was normal. Wall thickness was  normal. - Aortic valve: There was mild stenosis. Mean gradient (S): 13 mm  Hg. Peak gradient (S): 20 mm Hg. Valve area (VTI): 1.36 cm^2.  Valve area (Vmean): 1.18 cm^2. - Left atrium: The  atrium was moderately dilated. Impressions: - This is a limited study.  Radiology:  No results found.  ASSESSMENT AND PLAN:    Active Problems:   Rheumatoid arthritis ([place])   Dyslipidemia   Essential hypertension, benign   Gastric outlet obstruction   Gastroparesis   Protein-calorie malnutrition, severe  TANIEKA BLAIR is a 80 y.o. female with a history of CAD, s/p prior PCI to the LAD, HLD, HTN, PVCs, rheumatoid arthritis, peptic ulcer disease, previous gastroparesis and mild aortic stenosis who presented to [facility] on 01/06/16 for gastric outlet obstruction. Hospital admission complicated by atrial fib/ flutter and cardiology consulted.   Atrial flutter-  Per IM note, she did have afib during an ICU stay several years ago and cardioverted on amiodarone. She was felt to not be a good anticoagulation candidate due to falls. She has not had anymore falls. -- TSH normal -- 2D ECHO pending -- CHADVASC score of at least 5 (HTN 1, Age 34, CAD 1, F sex 1).  -- BPs soft. Last reading 96/64. Will allow some time for recently restarted acebutolol to kick in . If her rate is not well controlled by tonight, will plan to start 400 mg BID amiodarone. She will require long term  AC at some point but will make sure she doesn't need any more procedures first  Gastric outlet obstruction due to duodenitis with polypoid lesion in pyloric channel and surrounding ulcer and edema, biopsy negative for malignancy. "Gastroparesis" was likely secondary to gastric outlet obstruction. Not a diabetic. - Continue with TPN with until tolerating full liquid diet - CT abdomen and pelviswith no evidence of masses or malignancy - Will need repeat EGD if not able to advance diet - If able to advance diet, plan for outpatient EGD in 4-8 weeks  Hx of bradycardia on Toprol- resolved on Acebutolol.   PVC's (premature ventricular contractions) - well controlled on Acebutolol  Coronary artery disease - stable. Otherwise,  Continue Plavix, statin.  Aortic stenosis - Mild by recent echo. Repeat 2D ECHO pending.  HTN - Controlled.  Dyslipidemia - Continue statin.   SignedEileen Stanford, PA-C 01/13/2016 2:18 PM  Pager [phone]  Co-Sign MD As above, patient seen and examined. Briefly she is a 80 year old female with past medical history of coronary artery disease, hypertension, hyperlipidemia, rheumatoid arthritis and now with gastric outlet obstruction for evaluation of atrial fibrillation. Patient was admitted with abdominal pain and decreased oral intake. She was found to have gastric outlet obstruction which is improving with conservative measures. She was noted to be in atrial fibrillation and cardiology asked to evaluate. Patient has some palpitations but denies dyspnea or chest pain. Her beta blocker has been held for the past week. Electrocardiogram shows atrial fibrillation with nonspecific ST changes.TSH normal. 1 paroxysmal atrial fibrillation-the patient has had intermittent atrial fibrillation. Will resume beta blocker for rate control. If she has recurrent atrial fibrillation despite this would add amiodarone. CHADSvasc 5. When it is clear all procedures are complete would treat with apixaban 5 mg BID and DC plavix. Check echocardiogram for LV function. Note patient has a history of bradycardia on Toprol. Watch telemetry closely with resumption of acebutolol.  2 coronary artery disease-given need for anticoagulation would discontinue Plavix at time of initiation of apixaban. Continue statin. 3 gastric outlet obstruction-management per primary service. Kirk Ruths

## 2016-01-13 NOTE — Evaluation (Signed)
Physical Therapy Evaluation Patient Details Name: Angela [name] MRN: [id number] DOB: 05-Mar-1936 Today's Date: 01/13/2016   History of Present Illness  80 year old female with history of coronary artery disease status post PCI, hypertension, hyperlipidemia, rheumatoid arthritis, peptic ulcer disease, aortic stenosis and admitted for gastric outlet obstruction due to duodenitis   Clinical Impression  Pt admitted with above diagnosis. Pt currently with functional limitations due to the deficits listed below (see PT Problem List).  Pt will benefit from skilled PT to increase their independence and safety with mobility to allow discharge to the venue listed below.   Pt very agreeable to PT stating she hasn't walked since admission (only transferring to recliner/BSC) however limited by tachycardia (RN notified).     Follow Up Recommendations Home health PT;Supervision for mobility/OOB    Equipment Recommendations  None recommended by PT    Recommendations for Other Services       Precautions / Restrictions Precautions Precautions: Fall Precaution Comments: monitor HR      Mobility  Bed Mobility Overal bed mobility: Needs Assistance Bed Mobility: Supine to Sit     Supine to sit: Supervision     General bed mobility comments: supervision for lines  Transfers Overall transfer level: Needs assistance Equipment used: Rolling walker (2 wheeled) Transfers: Sit to/from Stand Sit to Stand: Min assist         General transfer comment: verbal cues for technique, assist to rise  Ambulation/Gait Ambulation/Gait assistance: Min guard Ambulation Distance (Feet): 15 Feet Assistive device: Rolling walker (2 wheeled) Gait Pattern/deviations: Step-through pattern;Decreased stride length     General Gait Details: pt ambulated to doorway of her room and telemetry monitor reading 160 bpm, decreased to 143 bpm and then pt ambulated back to recliner, HR 97 upon leaving room Investment banker, corporate  notified)  Stairs            Wheelchair Mobility    Modified Rankin (Stroke Patients Only)       Balance                                             Pertinent Vitals/Pain Pain Assessment: No/denies pain    Home Living Family/patient expects to be discharged to:: Private residence Living Arrangements: Alone Available Help at Discharge: Personal care attendant Type of Home: House Home Access: Elevator       Home Equipment: Environmental consultant - 2 wheels      Prior Function Level of Independence: Independent with assistive device(s)         Comments: reports using RW for mobility     Hand Dominance        Extremity/Trunk Assessment   Upper Extremity Assessment:  (hx RA)           Lower Extremity Assessment: Generalized weakness         Communication   Communication: No difficulties  Cognition Arousal/Alertness: Awake/alert Behavior During Therapy: WFL for tasks assessed/performed Overall Cognitive Status: Within Functional Limits for tasks assessed                      General Comments      Exercises        Assessment/Plan    PT Assessment Patient needs continued PT services  PT Diagnosis Difficulty walking;Generalized weakness   PT Problem List Decreased strength;Decreased activity tolerance;Decreased mobility  PT Treatment Interventions DME instruction;Gait training;Functional  mobility training;Patient/family education;Therapeutic activities;Therapeutic exercise   PT Goals (Current goals can be found in the Care Plan section) Acute Rehab PT Goals PT Goal Formulation: With patient Time For Goal Achievement: 01/20/16 Potential to Achieve Goals: Good    Frequency Min 3X/week   Barriers to discharge        Co-evaluation               End of Session Equipment Utilized During Treatment: Gait belt Activity Tolerance: Treatment limited secondary to medical complications (Comment) (tachycardia) Patient left:  in chair;with call bell/phone within reach;with chair alarm set;with family/visitor present Nurse Communication: Mobility status         Time: 1349-1403 PT Time Calculation (min) (ACUTE ONLY): 14 min   Charges:   PT Evaluation $PT Eval Low Complexity: 1 Procedure     PT G Codes:        Winona Sison,KATHrine E 01/13/2016, 3:41 PM Carmelia Bake, PT, DPT 01/13/2016 Pager: [phone]

## 2016-01-13 NOTE — Progress Notes (Signed)
[facility] Progress Note  Subjective: NG tube out, tolerating her second full liquid meal without GI distress. In good spirits.  Objective: Vital signs in last 24 hours: Temp:  [97.4 F (36.3 C)-98.2 F (36.8 C)] 97.8 F (36.6 C) (03/10 0550) Pulse Rate:  [56-114] 56 (03/10 0550) Resp:  [18-22] 18 (03/10 0550) BP: (92-114)/(52-74) 107/52 mmHg (03/10 0550) SpO2:  [97 %-100 %] 100 % (03/10 0550) Weight change:    PE: Unchanged  Lab Results: Results for orders placed or performed during the hospital encounter of 01/06/16 (from the past 24 hour(s))  Glucose, capillary     Status: Abnormal   Collection Time: 01/12/16 11:41 AM  Result Value Ref Range   Glucose-Capillary 108 (H) 65 - 99 mg/dL  Glucose, capillary     Status: Abnormal   Collection Time: 01/12/16  5:35 PM  Result Value Ref Range   Glucose-Capillary 170 (H) 65 - 99 mg/dL  Glucose, capillary     Status: Abnormal   Collection Time: 01/12/16 11:47 PM  Result Value Ref Range   Glucose-Capillary 111 (H) 65 - 99 mg/dL  Glucose, capillary     Status: None   Collection Time: 01/13/16  5:59 AM  Result Value Ref Range   Glucose-Capillary 97 65 - 99 mg/dL    Studies/Results: No results found.    Assessment: Partial pyloric outlet obstruction, presumed peptic etiology, responding to conservative management.  Plan: I would keep her on full liquid diet and reassess tomorrow and possible advance to soft mechanical. Continue PPI EGD if symptoms relapse while advancing diet, otherwise probable repeat EGD in 1-2 months. We'll continue to follow with you.    Shelonda Saxe C 01/13/2016, 8:32 AM  Pager [phone] If no answer or after 5 PM call [phone]

## 2016-01-13 NOTE — Progress Notes (Signed)
PT Cancellation Note  Patient Details Name: Angela [name] MRN: [id number] DOB: [birth year]   Cancelled Treatment:    Reason Eval/Treat Not Completed: Other (comment) Pt receiving update on her daughter in ICU from her other daughter at this time and agreeable to PT however would like PT to check back.   Shoshana Johal,KATHrine E 01/13/2016, 10:55 AM Carmelia Bake, PT, DPT 01/13/2016 Pager: [phone]

## 2016-01-14 ENCOUNTER — Inpatient Hospital Stay (HOSPITAL_COMMUNITY): Payer: Medicare Other

## 2016-01-14 DIAGNOSIS — K311 Adult hypertrophic pyloric stenosis: Principal | ICD-10-CM

## 2016-01-14 DIAGNOSIS — I4892 Unspecified atrial flutter: Secondary | ICD-10-CM

## 2016-01-14 LAB — ECHOCARDIOGRAM COMPLETE
Height: 62 in
Weight: 1872 oz

## 2016-01-14 LAB — TROPONIN I

## 2016-01-14 LAB — GLUCOSE, CAPILLARY
Glucose-Capillary: 87 mg/dL (ref 65–99)
Glucose-Capillary: 104 mg/dL — ABNORMAL HIGH (ref 65–99)
Glucose-Capillary: 106 mg/dL — ABNORMAL HIGH (ref 65–99)
Glucose-Capillary: 108 mg/dL — ABNORMAL HIGH (ref 65–99)

## 2016-01-14 MED ORDER — ACEBUTOLOL HCL 200 MG PO CAPS
200.0000 mg | ORAL_CAPSULE | Freq: Once | ORAL | Status: AC
  Administered 2016-01-14: 200 mg via ORAL
  Filled 2016-01-14: qty 1

## 2016-01-14 MED ORDER — APIXABAN 5 MG PO TABS
5.0000 mg | ORAL_TABLET | Freq: Two times a day (BID) | ORAL | Status: DC
  Administered 2016-01-14 – 2016-01-16 (×5): 5 mg via ORAL
  Filled 2016-01-14 (×6): qty 1

## 2016-01-14 MED ORDER — ACEBUTOLOL HCL 400 MG PO CAPS
400.0000 mg | ORAL_CAPSULE | Freq: Two times a day (BID) | ORAL | Status: DC
  Administered 2016-01-14 – 2016-01-16 (×4): 400 mg via ORAL
  Filled 2016-01-14 (×6): qty 1

## 2016-01-14 NOTE — Progress Notes (Signed)
[facility] PROGRESS NOTE  Angela [name] Angela [name] DOB: 28-Sep-1936 DOA: 01/06/2016 PCP: Irven Shelling, MD  Brief Summary  The patient is a 80 year old female with history of coronary artery disease status post PCI, hypertension, hyperlipidemia, rheumatoid arthritis, peptic ulcer disease, aortic stenosis who was sent to the hospital by [facility], Dr. Wynetta Emery, for gastric outlet obstruction after an endoscopy on 3/4 demonstrated evidence of gastric outlet obstruction near the pylorus due to a mass or an ulcer with scarring. An NG tube was placed to suction and the patient was started on TPN. Initially there was thought that she may need surgery however, at this point the surgeons are hoping for resolution with conservative measures.  Assessment/Plan  Gastric outlet obstruction due to duodenitis with polypoid lesion in pyloric channel and surrounding ulcer and edema, biopsy negative for malignancy.  "Gastroparesis" was likely secondary to gastric outlet obstruction.  Not a diabetic.   -  Appreciate GI and general surgery assistance -  FLD for the next 2 weeks with a small amount of soft food -  CT abdomen and pelviswith no evidence of masses or malignancy -  Continue PPI -  Plan for outpatient EGD in 4-8 weeks -  Ambulate/OOB  Rheumatoid arthritis  - continue PO steroid - resume methotrexate next week  Coronary artery disease - Denies any chest pain or shortness of breath,  -Resume plavix.  BB resumed last night  History of PVC, bradycardia, and atrial tachycardia and paroxysmal atrial fibrillation.  Went into a-flutter with RVR to 140s-150s.  CHADs2vasc = 5.  Last a-fib was related to severe illness/ICU stay several years ago, cardioverted with amiodarone, and per cardiology notes, no longer active problem.  Has had several outpatient monitors with no a-fib.   -  Cardiology assistance appreciated -  Troponins neg, TSH wnl -  ECHO pending -  Had episode of tachycardia  again this morning to 140s at rest:  Increase BB to 400mg  BID -  Defer cardioversion for now since she was not anticoagulated within 24 hours of a-fib and not a good candidate for TEE -  Start apixaban  Hyperlipidemia - Continue statin  Diarrhea - C diff negative  Severe protein calorie malnutrition -  supplements  Deconditioned, PT recommending home health  Diet:  soft Access:  PICC right arm IVF:  off Proph:  heparin  Code Status: full Family Communication: patient and her caretaker Disposition Plan:  Pending ECHO, decreased HR, likely home tomorrow with her caretaker and [facility] services   Procedures  EGD 01/07/2016    Consults  Gastroenterology  General surgery  Cardiology  HPI/Subjective:  Had episode of palpitation this morning.  Tolerated full liquids yesterday and eating her first solid meal this morning.  Denies dyspnea, nausea, chest pressure.  Objective: Filed Vitals:   01/13/16 1343 01/13/16 2035 01/14/16 0606 01/14/16 1500  BP: 96/64 116/79 107/69 109/66  Pulse: 93 98 91 62  Temp: 98.2 F (36.8 C) 98.1 F (36.7 C) 98 F (36.7 C) 97.7 F (36.5 C)  TempSrc: Oral Oral Oral Oral  Resp: 16 20 20 18   Height:      Weight:      SpO2: 99% 99% 99% 100%    Intake/Output Summary (Last 24 hours) at 01/14/16 1544 Last data filed at 01/14/16 1517  Gross per 24 hour  Intake     10 ml  Output      0 ml  Net     10 ml   [employer]  01/07/16 0600  Weight: 53.071 kg (117 lb)   Body mass index is 21.39 kg/(m^2).  Exam:   General:  Adult female, No acute distress  HEENT:  NCAT, MMM, NG removed  Cardiovascular:  IRRR and tachycardic, 2+ pulses, warm extremities  Respiratory:  CTAB, no increased WOB  Abdomen:   NABS, soft, NT/ND   MSK:   Decreased tone and bulk, no LEE, hands have ulnar deviation of MCP joints with joint swelling at elbows, wrists, and PIP joints with limited ROM and some decreased muscle bulk  Neuro:  Diffusely weak secondary  to joint deformities/RA  Data Reviewed: Basic Metabolic Panel:  Recent Labs Lab 01/08/16 0526 01/09/16 0515 01/10/16 0430 01/12/16 0442  NA 138 138 134* 138  K 3.1* 3.2* 4.3 3.8  CL 109 111 107 109  CO2 18* 23 23 23   GLUCOSE 144* 146* 203* 138*  BUN 10 9 12 17   CREATININE 0.59 0.39* 0.48 0.41*  CALCIUM 7.8* 8.1* 8.0* 8.3*  MG 2.1 1.9 2.2 2.0  PHOS 2.9 2.1* 2.7 4.0   Liver Function Tests:  Recent Labs Lab 01/08/16 0526 01/09/16 0515 01/12/16 0442  AST 18 17 23   ALT 19 17 28   ALKPHOS 36* 33* 33*  BILITOT 1.2 0.3 0.4  PROT 4.9* 4.4* 4.6*  ALBUMIN 2.9* 2.6* 2.6*   No results for input(s): LIPASE, AMYLASE in the last 168 hours. No results for input(s): AMMONIA in the last 168 hours. CBC:  Recent Labs Lab 01/08/16 0526 01/09/16 0515  WBC 5.5 5.4  NEUTROABS 3.4 3.4  HGB 12.5 12.1  HCT 37.6 35.5*  MCV 107.7* 106.6*  PLT 149* 159    Recent Results (from the past 240 hour(s))  C difficile quick scan w PCR reflex     Status: None   Collection Time: 01/10/16 12:01 AM  Result Value Ref Range Status   C Diff antigen NEGATIVE NEGATIVE Final   C Diff toxin NEGATIVE NEGATIVE Final   C Diff interpretation Negative for toxigenic C. difficile  Final     Studies: No results found.  Scheduled Meds: . acebutolol  400 mg Oral BID  . apixaban  5 mg Oral BID  . fesoterodine  8 mg Oral Daily  . fluticasone  1 spray Each Nare Daily  . folic acid  1 mg Oral Daily  . insulin aspart  0-9 Units Subcutaneous TID WC  . lidocaine  1 patch Transdermal QHS  . LORazepam  1 mg Intravenous QHS  . nystatin cream   Topical BID  . pantoprazole  40 mg Oral BID AC  . predniSONE  5 mg Oral Q breakfast  . simvastatin  40 mg Oral q1800  . sodium chloride flush  10-40 mL Intracatheter Q12H   Continuous Infusions: . sodium chloride 10 mL/hr at 01/09/16 1828    Active Problems:   Rheumatoid arthritis ([place])   Dyslipidemia   Essential hypertension, benign   Gastric outlet  obstruction   Gastroparesis   Protein-calorie malnutrition, severe   Atrial fibrillation ([place])    Time spent: 30 min    Angela [name], [place] [facility] Pager [phone]. If 7PM-7AM, please contact night-coverage at [email or web address], password [facility] 01/14/2016, 3:44 PM  LOS: 8 days

## 2016-01-14 NOTE — Progress Notes (Signed)
ANTICOAGULATION CONSULT NOTE - Initial Consult  Pharmacy Consult for Apixaban Indication: Atrial Fibrillation / Flutter  Allergies  Allergen Reactions  . Peanut-Containing Drug Products Anaphylaxis  . Reglan [Metoclopramide] Other (See Comments)    "Messed my mind up"  . Codeine Nausea Only  . Iohexol Swelling     Desc: pt received hypaque 60% in 1994 and had erythema, hives & lips swell.  She did ok w/o premeds in 8/05 at [facility].  Pt. takes daily prednisone for rheumatoid arthritis.  Poor venous access today (7/7/6) so we did her w/o iv contrast.   . Pineapple Swelling  . Shellfish Allergy Swelling    Lips swell    Patient Measurements: Height: 5\' 2"  (157.5 cm) Weight: 117 lb (53.071 kg) IBW/kg (Calculated) : 50.1 Heparin Dosing Weight:   Vital Signs: Temp: 98 F (36.7 C) (03/11 0606) Temp Source: Oral (03/11 0606) BP: 107/69 mmHg (03/11 0606) Pulse Rate: 91 (03/11 0606)  Labs:  Recent Labs  01/12/16 0442 01/13/16 1300 01/13/16 1940 01/14/16 0007  CREATININE 0.41*  --   --   --   TROPONINI  --  <0.03 <0.03 <0.03    Estimated Creatinine Clearance: 46.6 mL/min (by C-G formula based on Cr of 0.41).   Medical History: Past Medical History  Diagnosis Date  . Cervical stenosis of spine   . Spondylolisthesis of lumbar region   . A-fib ([place])   . Asthma   . Depression   . Dyslipidemia   . GERD (gastroesophageal reflux disease)   . Obesity   . PVC's (premature ventricular contractions)   . PUD (peptic ulcer disease)     can not take aspirin  . Osteoporosis     alendronate since 2012  . Macular degeneration   . Aortic stenosis     mild by echo 03/2012  . Hypertension   . CAD (coronary artery disease)     s/p PCI with BMS to mid LAD--2/08 not on aspirin due to frequent ulcers.  Repeat cath for CP 09/2012 with patent LAD stent and otherwise normal coronary arteries  . Elbow fracture, right     history of- never any surgery  . Rheumatoid arthritis(714.0)    Dr. Lenna Gilford follows  . Impaired physical mobility     due to rheumatoid arthritis "ambulates with walker" limited free standing.   Assessment: 78 yoF with hx Afib not on anticoagulation due to high fall risk, CAD s/p prior PCI to LAD, PVCs, mild aortic stenosis, PUD, and previous gastroparesis presents with gastric outlet obstruction, found to be in atrial fibrillation / flutter.  CHADSVASC = 4 (age, sex, HTN).  Pharmacy consulted to start apixaban.      3/11:  -Pt currently on SQ heparin 5000 units TID.  Last dose 3/11 @ 0600.   -Also on plavix 75mg  daily resumed inpatient, now discontinued.  Last dose 3/11 @ 1000 -CBC on 3/6 shows Hgb/Plts WNL.   -SCr stable, CrCl ~47 ml/min.  -Age = 80 yo -TBW = 53.1kg -Discussed lower dosing vs standard dosing as pt's age near 80 yo and wt < 60kg.  MD prefers to use standard dosing for now.    Goal of Therapy:  Apixaban cannot be reliably monitored with standard laboratory tests Monitor platelets by anticoagulation protocol: Yes    Plan:  Stop SQ heparin Start Apixaban 5mg  PO BID  Check CBC in morning F/u renal function Monitor for signs and symptoms of bleeding Pharmacy will provide anticoagulation education prior to discharge  Ralene Bathe, PharmD,  BCPS 01/14/2016, 10:27 AM  Pager: [phone]

## 2016-01-14 NOTE — Discharge Instructions (Signed)

## 2016-01-14 NOTE — Progress Notes (Signed)
PROGRESS NOTE  Subjective:    Angela [name] is a 80 y.o. female with a history of CAD, s/p prior PCI to the LAD, HLD, HTN, PVCs, rheumatoid arthritis, peptic ulcer disease, previous gastroparesis and mild aortic stenosis who presented to [facility]06[facility]16 for gastric outlet obstruction. Hospital admission complicated by atrial fib/ flutter and cardiology consulted  Her acebutolol had been held for the past several days and her HR with her a-fib increased.  She is feeling much better now that she is back on the sectral. Has not been on anticoagulation due to high fall risk .   We discussed this in great detail this am  Objective:    Vital Signs:   Temp:  [98 F (36.7 C)-98.2 F (36.8 C)] 98 F (36.7 C) (03/11 0606) Pulse Rate:  [91-98] 91 (03/11 0606) Resp:  [16-20] 20 (03/11 0606) BP: (96-116)/(64-79) 107/69 mmHg (03/11 0606) SpO2:  [99 %] 99 % (03/11 0606)  Last BM Date: 01/13/16   24-hour weight change: Weight change:   Weight trends: Filed Weights   01/07/16 0600  Weight: 117 lb (53.071 kg)    Intake/Output:  03/10 0701 - 03/11 0700 In: 120 [P.O.:120] Out: -      Physical Exam: BP 107/69 mmHg  Pulse 91  Temp(Src) 98 F (36.7 C) (Oral)  Resp 20  Ht 5\' 2"  (1.575 m)  Wt 117 lb (53.071 kg)  BMI 21.39 kg/m2  SpO2 99%  Wt Readings from Last 3 Encounters:  01/07/16 117 lb (53.071 kg)  12/07/15 123 lb (55.792 kg)  11/01/15 125 lb (56.7 kg)    General: Vital signs reviewed and noted. Very pleasant and knowledgeable lady . Obvious RA deformities in her hands   Head: Normocephalic, atraumatic.  Eyes: conjunctivae/corneas clear.  EOM's intact.   Throat: normal  Neck:  normal   Lungs:    clear   Heart:  Irreg. Irreg.   Abdomen:  Soft, non-tender, non-distended    Extremities: Arthritis - Rheumatoid arthritis deformities.    Neurologic: A&O X3, CN II - XII are grossly intact.   Psych: Normal     Labs: BMET:  Recent Labs  01/12/16 0442  NA 138    K 3.8  CL 109  CO2 23  GLUCOSE 138*  BUN 17  CREATININE 0.41*  CALCIUM 8.3*  MG 2.0  PHOS 4.0    Liver function tests:  Recent Labs  01/12/16 0442  AST 23  ALT 28  ALKPHOS 33*  BILITOT 0.4  PROT 4.6*  ALBUMIN 2.6*   No results for input(s): LIPASE, AMYLASE in the last 72 hours.  CBC: No results for input(s): WBC, NEUTROABS, HGB, HCT, MCV, PLT in the last 72 hours.  Cardiac Enzymes:  Recent Labs  01/13/16 1300 01/13/16 1940 01/14/16 0007  TROPONINI <0.03 <0.03 <0.03    Coagulation Studies: No results for input(s): LABPROT, INR in the last 72 hours.  Other: Invalid input(s): POCBNP No results for input(s): DDIMER in the last 72 hours. No results for input(s): HGBA1C in the last 72 hours. No results for input(s): CHOL, HDL, LDLCALC, TRIG, CHOLHDL in the last 72 hours.  Recent Labs  01/13/16 1300  TSH 1.427   No results for input(s): VITAMINB12, FOLATE, FERRITIN, TIBC, IRON, RETICCTPCT in the last 72 hours.   Other results:  Tele   ( personally reviewed )  - atrial fib , HR is 108  Medications:    Infusions: . sodium chloride 10 mL/hr at  01/09/16 1828    Scheduled Medications: . acebutolol  200 mg Oral BID  . clopidogrel  75 mg Oral Daily  . fesoterodine  8 mg Oral Daily  . fluticasone  1 spray Each Nare Daily  . folic acid  1 mg Oral Daily  . heparin  5,000 Units Subcutaneous 3 times per day  . insulin aspart  0-9 Units Subcutaneous TID WC  . lidocaine  1 patch Transdermal QHS  . LORazepam  1 mg Intravenous QHS  . nystatin cream   Topical BID  . pantoprazole  40 mg Oral BID AC  . predniSONE  5 mg Oral Q breakfast  . simvastatin  40 mg Oral q1800  . sodium chloride flush  10-40 mL Intracatheter Q12H    Assessment/ Plan:   Active Problems:   Rheumatoid arthritis ([place])   Dyslipidemia   Essential hypertension, benign   Gastric outlet obstruction   Gastroparesis   Protein-calorie malnutrition, severe   Atrial fibrillation ([place])  1.  Atrial fib - HR is improving since being started back on the Sectral. At this point, I do not necessarily think that we will have to start amio.   She has been well controlled with Sectral in the past.  2. Gastric outlet obstruction:   Medical therapy for now.   Disposition:  Length of Stay: 8  Thayer Headings, Brooke Bonito., MD, [facility] 01/14/2016, 8:07 AM Office [phone] Pager [phone]

## 2016-01-14 NOTE — Progress Notes (Signed)
  Echocardiogram 2D Echocardiogram has been performed.  Angela [name] M 01/14/2016, 3:04 PM

## 2016-01-14 NOTE — Progress Notes (Signed)
[facility] Progress Note  Subjective: No current complaints in regards to her gastric outlet obstruction. She is eating breakfast which is consisting of eggs and pancakes. Denies abdominal pain or vomiting.  Objective: Vital signs in last 24 hours: Temp:  [98 F (36.7 C)-98.2 F (36.8 C)] 98 F (36.7 C) (03/11 0606) Pulse Rate:  [91-98] 91 (03/11 0606) Resp:  [16-20] 20 (03/11 0606) BP: (96-116)/(64-79) 107/69 mmHg (03/11 0606) SpO2:  [99 %] 99 % (03/11 0606) Weight change:    PE:  No distress  Heart irregular rhythm  Lungs clear  Abdomen soft nontender  Lab Results: Results for orders placed or performed during the hospital encounter of 01/06/16 (from the past 24 hour(s))  Glucose, capillary     Status: Abnormal   Collection Time: 01/13/16 11:35 AM  Result Value Ref Range   Glucose-Capillary 120 (H) 65 - 99 mg/dL  Troponin I (q 6hr x 3)     Status: None   Collection Time: 01/13/16  1:00 PM  Result Value Ref Range   Troponin I <0.03 <0.031 ng/mL  TSH     Status: None   Collection Time: 01/13/16  1:00 PM  Result Value Ref Range   TSH 1.427 0.350 - 4.500 uIU/mL  Glucose, capillary     Status: Abnormal   Collection Time: 01/13/16  6:00 PM  Result Value Ref Range   Glucose-Capillary 125 (H) 65 - 99 mg/dL  Troponin I (q 6hr x 3)     Status: None   Collection Time: 01/13/16  7:40 PM  Result Value Ref Range   Troponin I <0.03 <0.031 ng/mL  Glucose, capillary     Status: Abnormal   Collection Time: 01/13/16  8:46 PM  Result Value Ref Range   Glucose-Capillary 124 (H) 65 - 99 mg/dL  Troponin I (q 6hr x 3)     Status: None   Collection Time: 01/14/16 12:07 AM  Result Value Ref Range   Troponin I <0.03 <0.031 ng/mL  Glucose, capillary     Status: None   Collection Time: 01/14/16  8:09 AM  Result Value Ref Range   Glucose-Capillary 87 65 - 99 mg/dL   Comment 1 Notify RN     Studies/Results: No results found.    Assessment: Gastric outlet  obstruction improving  Plan:   Continue medical therapy.    SAM F Caden Fatica 01/14/2016, 10:01 AM  Pager: [phone] If no answer or after 5 PM call [phone]

## 2016-01-15 DIAGNOSIS — I471 Supraventricular tachycardia: Secondary | ICD-10-CM

## 2016-01-15 LAB — BASIC METABOLIC PANEL
ANION GAP: 7 (ref 5–15)
BUN: 15 mg/dL (ref 6–20)
CALCIUM: 9 mg/dL (ref 8.9–10.3)
CHLORIDE: 107 mmol/L (ref 101–111)
CO2: 26 mmol/L (ref 22–32)
CREATININE: 0.58 mg/dL (ref 0.44–1.00)
GFR calc non Af Amer: >60 mL/min (ref >60)
Glucose, Bld: 87 mg/dL (ref 65–99)
Potassium: 4.4 mmol/L (ref 3.5–5.1)
SODIUM: 140 mmol/L (ref 135–145)

## 2016-01-15 LAB — CBC
HEMATOCRIT: 35.3 % — AB (ref 36.0–46.0)
HEMOGLOBIN: 11.9 g/dL — AB (ref 12.0–15.0)
MCH: 35.8 pg — ABNORMAL HIGH (ref 26.0–34.0)
MCHC: 33.7 g/dL (ref 30.0–36.0)
MCV: 106.3 fL — ABNORMAL HIGH (ref 78.0–100.0)
Platelets: 169 10*3/uL (ref 150–400)
RBC: 3.32 MIL/uL — ABNORMAL LOW (ref 3.87–5.11)
RDW: 15.1 % (ref 11.5–15.5)
WBC: 7.2 10*3/uL (ref 4.0–10.5)

## 2016-01-15 LAB — GLUCOSE, CAPILLARY
GLUCOSE-CAPILLARY: 86 mg/dL (ref 65–99)
GLUCOSE-CAPILLARY: 98 mg/dL (ref 65–99)
Glucose-Capillary: 116 mg/dL — ABNORMAL HIGH (ref 65–99)
Glucose-Capillary: 95 mg/dL (ref 65–99)

## 2016-01-15 MED ORDER — SENNA 8.6 MG PO TABS
1.0000 | ORAL_TABLET | Freq: Every day | ORAL | Status: DC
  Administered 2016-01-15: 8.6 mg via ORAL
  Filled 2016-01-15: qty 1

## 2016-01-15 MED ORDER — METOPROLOL TARTRATE 1 MG/ML IV SOLN: 2.5000 mg | INTRAVENOUS | Status: DC | PRN

## 2016-01-15 MED ORDER — ZINC SULFATE 220 (50 ZN) MG PO CAPS
220.0000 mg | ORAL_CAPSULE | Freq: Two times a day (BID) | ORAL | Status: DC
  Administered 2016-01-15 – 2016-01-16 (×3): 220 mg via ORAL
  Filled 2016-01-15 (×4): qty 1

## 2016-01-15 MED ORDER — PHENOL 1.4 % MT LIQD
1.0000 | OROMUCOSAL | Status: DC | PRN
  Administered 2016-01-15: 1 via OROMUCOSAL
  Filled 2016-01-15 (×2): qty 177

## 2016-01-15 NOTE — Progress Notes (Signed)
[facility] PROGRESS NOTE  Angela [name] [id number] DOB: 09/26/1936 DOA: 01/06/2016 PCP: Irven Shelling, MD  Brief Summary  The patient is a 80 year old female with history of coronary artery disease status post PCI, hypertension, hyperlipidemia, rheumatoid arthritis, peptic ulcer disease, aortic stenosis who was sent to the hospital by [facility], Dr. Wynetta Emery, for gastric outlet obstruction after an endoscopy on 3/4 demonstrated evidence of gastric outlet obstruction near the pylorus due to a mass or an ulcer with scarring. An NG tube was placed to suction and the patient was started on TPN. Initially there was thought that she may need surgery however, at this point the surgeons are hoping for resolution with conservative measures.  Assessment/Plan  Gastric outlet obstruction due to duodenitis with polypoid lesion in pyloric channel and surrounding ulcer and edema, biopsy negative for malignancy.  "Gastroparesis" was likely secondary to gastric outlet obstruction.  Not a diabetic.   -  Appreciate GI and general surgery assistance -  FLD for the next 2 weeks with a small amount of soft food -  CT abdomen and pelviswith no evidence of masses or malignancy -  Continue PPI -  Plan for outpatient EGD in 4-8 weeks -  Ambulate/OOB  Rheumatoid arthritis  - continue PO steroid - resume methotrexate next week  Coronary artery disease - Denies any chest pain or shortness of breath,  -Resume plavix.  BB resumed last night  History of PVC, bradycardia, and atrial tachycardia and paroxysmal atrial fibrillation.  Went into a-flutter with RVR to 140s-150s.  CHADs2vasc = 5.  Last a-fib was related to severe illness/ICU stay several years ago, cardioverted with amiodarone, and per cardiology notes, no longer active problem.  Rate improved, however, still having multiple episodes of SVT.  -  Cardiology assistance appreciated -  Troponins neg, TSH wnl -  ECHO:  Moderate AS, moderate TR,  EF 45% with moderate to severe LA enlargement -  Start apixaban > CM to verify affordability -  Increased to acebutolol 400mg  BID  Hyperlipidemia - Continue statin  Diarrhea - C diff negative  Severe protein calorie malnutrition -  supplements  Deconditioned, PT recommending home health  Diet:  soft Access:  PICC right arm IVF:  off Proph:  heparin  Code Status: full Family Communication: patient and her caretaker Disposition Plan:   Home first thing tomorrow morning.  Needs to arrange for her caregiver.  Needs apixaban verified and home health services arranged.  Patient told me this morning that the cardiologist wanted to observe her until tomorrow, however, upon reviewing his note this afternoon, he stated she was stable from a cardiac standpoint and could follow up with him in clinic.  Due to it being late in the day and it being a Sunday and therefore difficult to get her caretaker arranged, will need to defer discharge to first thing tomorrow morning.     Procedures  EGD 01/07/2016  PICC 3/4    Consults  Gastroenterology  General surgery  Cardiology  HPI/Subjective:  Has sinus congestion and sore throat today.  Feels like she is getting a cold.  Felt like her solid food was getting stuck "at the bottom of the esophagus" and therefore she has been taking in mostly liquids.  Denies chest pressure, but coughing some and feels a little SOB.    Objective: Filed Vitals:   01/14/16 0606 01/14/16 1500 01/14/16 2106 01/15/16 0500  BP: 107/69 109/66 112/65 106/61  Pulse: 91 62 60 72  Temp: 98 F (36.7  C) 97.7 F (36.5 C) 97.8 F (36.6 C) 97.8 F (36.6 C)  TempSrc: Oral Oral Oral Oral  Resp: 20 18 18 18   Height:      Weight:      SpO2: 99% 100% 99% 99%    Intake/Output Summary (Last 24 hours) at 01/15/16 1400 Last data filed at 01/14/16 1900  Gross per 24 hour  Intake    250 ml  Output    550 ml  Net   -300 ml   Filed Weights   01/07/16 0600  Weight:  53.071 kg (117 lb)   Body mass index is 21.39 kg/(m^2).  Exam:   General:  Adult female, No acute distress  HEENT:  NCAT, MMM, NG removed  Cardiovascular:  RRR and tachycardic, 2+ pulses, warm extremities  Tele:  Multiple episodes of SVT.  Generally in a-fib overnight, but now back in SR with ectopic atrial beats.  Respiratory:  CTAB, no increased WOB  Abdomen:   NABS, soft, NT/ND   MSK:   Decreased tone and bulk, no LEE, hands have ulnar deviation of MCP joints with joint swelling at elbows, wrists, and PIP joints with limited ROM and some decreased muscle bulk  Neuro:  Diffusely weak secondary to joint deformities/RA  Data Reviewed: Basic Metabolic Panel:  Recent Labs Lab 01/09/16 0515 01/10/16 0430 01/12/16 0442 01/15/16 0408  NA 138 134* 138 140  K 3.2* 4.3 3.8 4.4  CL 111 107 109 107  CO2 23 23 23 26   GLUCOSE 146* 203* 138* 87  BUN 9 12 17 15   CREATININE 0.39* 0.48 0.41* 0.58  CALCIUM 8.1* 8.0* 8.3* 9.0  MG 1.9 2.2 2.0  --   PHOS 2.1* 2.7 4.0  --    Liver Function Tests:  Recent Labs Lab 01/09/16 0515 01/12/16 0442  AST 17 23  ALT 17 28  ALKPHOS 33* 33*  BILITOT 0.3 0.4  PROT 4.4* 4.6*  ALBUMIN 2.6* 2.6*   No results for input(s): LIPASE, AMYLASE in the last 168 hours. No results for input(s): AMMONIA in the last 168 hours. CBC:  Recent Labs Lab 01/09/16 0515 01/15/16 0408  WBC 5.4 7.2  NEUTROABS 3.4  --   HGB 12.1 11.9*  HCT 35.5* 35.3*  MCV 106.6* 106.3*  PLT 159 169    Recent Results (from the past 240 hour(s))  C difficile quick scan w PCR reflex     Status: None   Collection Time: 01/10/16 12:01 AM  Result Value Ref Range Status   C Diff antigen NEGATIVE NEGATIVE Final   C Diff toxin NEGATIVE NEGATIVE Final   C Diff interpretation Negative for toxigenic C. difficile  Final     Studies: No results found.  Scheduled Meds: . acebutolol  400 mg Oral BID  . apixaban  5 mg Oral BID  . fesoterodine  8 mg Oral Daily  .  fluticasone  1 spray Each Nare Daily  . folic acid  1 mg Oral Daily  . insulin aspart  0-9 Units Subcutaneous TID WC  . lidocaine  1 patch Transdermal QHS  . LORazepam  1 mg Intravenous QHS  . nystatin cream   Topical BID  . pantoprazole  40 mg Oral BID AC  . predniSONE  5 mg Oral Q breakfast  . senna  1 tablet Oral QHS  . simvastatin  40 mg Oral q1800  . sodium chloride flush  10-40 mL Intracatheter Q12H  . zinc sulfate  220 mg Oral BID   Continuous  Infusions: . sodium chloride 10 mL/hr at 01/09/16 1828    Active Problems:   Rheumatoid arthritis ([place])   Dyslipidemia   Essential hypertension, benign   Gastric outlet obstruction   Gastroparesis   Protein-calorie malnutrition, severe   Atrial fibrillation ([place])    Time spent: 30 min    Shalisha Clausing, [place] [facility] Pager [phone]. If 7PM-7AM, please contact night-coverage at [email or web address], password [facility] 01/15/2016, 2:00 PM  LOS: 9 days

## 2016-01-15 NOTE — Progress Notes (Signed)
[facility] Progress Note  Subjective: She is doing better in regards to her gastric outlet obstruction. She is eating. She denies vomiting.  Objective: Vital signs in last 24 hours: Temp:  [97.7 F (36.5 C)-97.8 F (36.6 C)] 97.8 F (36.6 C) (03/12 0500) Pulse Rate:  [60-72] 72 (03/12 0500) Resp:  [18] 18 (03/12 0500) BP: (106-112)/(61-66) 106/61 mmHg (03/12 0500) SpO2:  [99 %-100 %] 99 % (03/12 0500) Weight change:    PE:  No distress  Abdomen soft and nontender  Lab Results: Results for orders placed or performed during the hospital encounter of 01/06/16 (from the past 24 hour(s))  Glucose, capillary     Status: Abnormal   Collection Time: 01/14/16 11:39 AM  Result Value Ref Range   Glucose-Capillary 104 (H) 65 - 99 mg/dL   Comment 1 Notify RN   Glucose, capillary     Status: Abnormal   Collection Time: 01/14/16  4:14 PM  Result Value Ref Range   Glucose-Capillary 106 (H) 65 - 99 mg/dL   Comment 1 Notify RN   Glucose, capillary     Status: Abnormal   Collection Time: 01/14/16  9:31 PM  Result Value Ref Range   Glucose-Capillary 108 (H) 65 - 99 mg/dL   Comment 1 Notify RN    Comment 2 Document in Chart   CBC     Status: Abnormal   Collection Time: 01/15/16  4:08 AM  Result Value Ref Range   WBC 7.2 4.0 - 10.5 K/uL   RBC 3.32 (L) 3.87 - 5.11 MIL/uL   Hemoglobin 11.9 (L) 12.0 - 15.0 g/dL   HCT 35.3 (L) 36.0 - 46.0 %   MCV 106.3 (H) 78.0 - 100.0 fL   MCH 35.8 (H) 26.0 - 34.0 pg   MCHC 33.7 30.0 - 36.0 g/dL   RDW 15.1 11.5 - 15.5 %   Platelets 169 150 - 400 K/uL  Basic metabolic panel     Status: None   Collection Time: 01/15/16  4:08 AM  Result Value Ref Range   Sodium 140 135 - 145 mmol/L   Potassium 4.4 3.5 - 5.1 mmol/L   Chloride 107 101 - 111 mmol/L   CO2 26 22 - 32 mmol/L   Glucose, Bld 87 65 - 99 mg/dL   BUN 15 6 - 20 mg/dL   Creatinine, Ser 0.58 0.44 - 1.00 mg/dL   Calcium 9.0 8.9 - 10.3 mg/dL   GFR calc non Af Amer >60 >60 mL/min   GFR calc Af Amer >60 >60 mL/min   Anion gap 7 5 - 15  Glucose, capillary     Status: None   Collection Time: 01/15/16  8:07 AM  Result Value Ref Range   Glucose-Capillary 86 65 - 99 mg/dL    Studies/Results: No results found.    Assessment: Gastric outlet obstruction felt secondary to peptic disease.  Plan:   Continue current management with PPI therapy. Hopefully she can be discharged soon. She should follow-up with Dr. Michail Sermon who was planning repeat endoscopy in a couple of months. We will sign off. Call us if needed.    Cassell Clement 01/15/2016, 10:23 AM  Pager: [phone] If no answer or after 5 PM call [phone]

## 2016-01-15 NOTE — Care Management Note (Signed)
Case Management Note  Patient Details  Name: Angela [name] MRN: [id number] Date of Birth: 14-Jun-1936  Subjective/Objective:     Gastric outlet obstruction due to duodenitis                Action/Plan: Discharge Planning: NCM spoke to pt at bedside. Offered choice for HH/provided [facility] list. States she had [place] in the past. She has two walkers at home. She is requesting 3n1 bedside commode. Will have NCM contact [place] on 01/16/2016 to order 3n1 bedside commode. Pt states she has private caregiver that comes 3 hours per on Mon, Wed, Thurs and Fri in the evening. Sent message to [facility] rep for [facility] PT/OT for scheduled dc tomorrow.  PCP- [name] MD  Expected Discharge Date: 01/16/2016            Expected Discharge Plan:  [place]  In-House Referral:  NA  Discharge planning Services  CM Consult  Post Acute Care Choice:  Home Health Choice offered to:  NA, Patient  DME Arranged:  3-N-1 DME Agency:  [place]:  PT, OT [facility] Agency:  [place]  Status of Service:  Completed, signed off  Medicare Important Message Given:  Yes Date Medicare IM Given:    Medicare IM give by:    Date Additional Medicare IM Given:    Additional Medicare Important Message give by:     If discussed at [place] of Stay Meetings, dates discussed:    Additional Comments:  Erenest Rasher, RN 01/15/2016, 5:42 PM

## 2016-01-15 NOTE — Progress Notes (Signed)
PROGRESS NOTE  Subjective:    Angela [name] is a 80 y.o. female with a history of CAD, s/p prior PCI to the LAD, HLD, HTN, PVCs, rheumatoid arthritis, peptic ulcer disease, previous gastroparesis and mild aortic stenosis who presented to [facility] on 01/06/16 for gastric outlet obstruction. Hospital admission complicated by atrial fib/ flutter and cardiology consulted  Her acebutolol had been held for the past several days and her HR with her a-fib increased.  She is feeling much better now that she is back on the sectral. Has not been on anticoagulation due to high fall risk .   We discussed this in great detail this am.  She had several episodes of SVT last night ( retrograde P waves)  Echo shows mildly depressed LV function  - EF 45% Mild MR  Mild - mod AS Atrial enlargement. TR  Objective:    Vital Signs:   Temp:  [97.7 F (36.5 C)-97.8 F (36.6 C)] 97.8 F (36.6 C) (03/12 0500) Pulse Rate:  [60-72] 72 (03/12 0500) Resp:  [18] 18 (03/12 0500) BP: (106-112)/(61-66) 106/61 mmHg (03/12 0500) SpO2:  [99 %-100 %] 99 % (03/12 0500)  Last BM Date: 01/14/16   24-hour weight change: Weight change:   Weight trends: Filed Weights   01/07/16 0600  Weight: 117 lb (53.071 kg)    Intake/Output:  03/11 0701 - 03/12 0700 In: 790 [P.O.:780; I.V.:10] Out: 700 [Urine:700]     Physical Exam: BP 106/61 mmHg  Pulse 72  Temp(Src) 97.8 F (36.6 C) (Oral)  Resp 18  Ht 5\' 2"  (1.575 m)  Wt 117 lb (53.071 kg)  BMI 21.39 kg/m2  SpO2 99%  Wt Readings from Last 3 Encounters:  01/07/16 117 lb (53.071 kg)  12/07/15 123 lb (55.792 kg)  11/01/15 125 lb (56.7 kg)    General: Vital signs reviewed and noted. Very pleasant and knowledgeable lady . Obvious RA deformities in her hands   Head: Normocephalic, atraumatic.  Eyes: conjunctivae/corneas clear.  EOM's intact.   Throat: normal  Neck:  normal   Lungs:    clear   Heart: RR , soft systolic murmur  Abdomen:  Soft, non-tender,  non-distended    Extremities: Arthritis - Rheumatoid arthritis deformities.    Neurologic: A&O X3, CN II - XII are grossly intact.   Psych: Normal     Labs: BMET:  Recent Labs  01/15/16 0408  NA 140  K 4.4  CL 107  CO2 26  GLUCOSE 87  BUN 15  CREATININE 0.58  CALCIUM 9.0    Liver function tests: No results for input(s): AST, ALT, ALKPHOS, BILITOT, PROT, ALBUMIN in the last 72 hours. No results for input(s): LIPASE, AMYLASE in the last 72 hours.  CBC:  Recent Labs  01/15/16 0408  WBC 7.2  HGB 11.9*  HCT 35.3*  MCV 106.3*  PLT 169    Cardiac Enzymes:  Recent Labs  01/13/16 1300 01/13/16 1940 01/14/16 0007  TROPONINI <0.03 <0.03 <0.03    Coagulation Studies: No results for input(s): LABPROT, INR in the last 72 hours.  Other: Invalid input(s): POCBNP No results for input(s): DDIMER in the last 72 hours. No results for input(s): HGBA1C in the last 72 hours. No results for input(s): CHOL, HDL, LDLCALC, TRIG, CHOLHDL in the last 72 hours.  Recent Labs  01/13/16 1300  TSH 1.427   No results for input(s): VITAMINB12, FOLATE, FERRITIN, TIBC, IRON, RETICCTPCT in the last 72 hours.   Other results:  Tele   ( personally reviewed )  - NSR ,   Episodes of SVT - lasts about 1 min  Medications:    Infusions: . sodium chloride 10 mL/hr at 01/09/16 1828    Scheduled Medications: . acebutolol  400 mg Oral BID  . apixaban  5 mg Oral BID  . fesoterodine  8 mg Oral Daily  . fluticasone  1 spray Each Nare Daily  . folic acid  1 mg Oral Daily  . insulin aspart  0-9 Units Subcutaneous TID WC  . lidocaine  1 patch Transdermal QHS  . LORazepam  1 mg Intravenous QHS  . nystatin cream   Topical BID  . pantoprazole  40 mg Oral BID AC  . predniSONE  5 mg Oral Q breakfast  . simvastatin  40 mg Oral q1800  . sodium chloride flush  10-40 mL Intracatheter Q12H    Assessment/ Plan:   Active Problems:   Rheumatoid arthritis ([place])   Dyslipidemia   Essential  hypertension, benign   Gastric outlet obstruction   Gastroparesis   Protein-calorie malnutrition, severe   Atrial fibrillation ([place])  1. Atrial fib - HR is improving since being started back on the Sectral. At this point, I do not necessarily think that we will have to start amio.   She has been well controlled with Sectral in the past.  2. Gastric outlet obstruction:   Medical therapy for now.   3. SVT:  Had several brief episodes of SVT - has retrograde P waves. Looks more c/w SVT than PAF. Continue meds. I taught her the valsalva maneuver Consider Propranolol 10  PRN when she goes home   She should be able to go home soon.   Stable from a cardiac standpoint.    She would like to follow up with me in the office.   Disposition:  Length of Stay: 9  Thayer Headings, Brooke Bonito., MD, [facility] 01/15/2016, 7:59 AM Office [phone] Pager [phone]

## 2016-01-16 ENCOUNTER — Ambulatory Visit: Payer: Medicare Other | Admitting: Physical Medicine & Rehabilitation

## 2016-01-16 DIAGNOSIS — I471 Supraventricular tachycardia, unspecified: Secondary | ICD-10-CM

## 2016-01-16 DIAGNOSIS — K2980 Duodenitis without bleeding: Secondary | ICD-10-CM

## 2016-01-16 DIAGNOSIS — I4892 Unspecified atrial flutter: Secondary | ICD-10-CM

## 2016-01-16 HISTORY — DX: Supraventricular tachycardia, unspecified: I47.10

## 2016-01-16 HISTORY — DX: Supraventricular tachycardia: I47.1

## 2016-01-16 HISTORY — DX: Unspecified atrial flutter: I48.92

## 2016-01-16 HISTORY — DX: Duodenitis without bleeding: K29.80

## 2016-01-16 LAB — CBC
HEMATOCRIT: 34 % — AB (ref 36.0–46.0)
Hemoglobin: 11.6 g/dL — ABNORMAL LOW (ref 12.0–15.0)
MCH: 36.5 pg — AB (ref 26.0–34.0)
MCHC: 34.1 g/dL (ref 30.0–36.0)
MCV: 106.9 fL — AB (ref 78.0–100.0)
PLATELETS: 160 10*3/uL (ref 150–400)
RBC: 3.18 MIL/uL — AB (ref 3.87–5.11)
RDW: 15 % (ref 11.5–15.5)
WBC: 5.5 10*3/uL (ref 4.0–10.5)

## 2016-01-16 LAB — BASIC METABOLIC PANEL
Anion gap: 8 (ref 5–15)
BUN: 12 mg/dL (ref 6–20)
CHLORIDE: 107 mmol/L (ref 101–111)
CO2: 24 mmol/L (ref 22–32)
CREATININE: 0.57 mg/dL (ref 0.44–1.00)
Calcium: 8.6 mg/dL — ABNORMAL LOW (ref 8.9–10.3)
GFR calc Af Amer: >60 mL/min (ref >60)
GFR calc non Af Amer: >60 mL/min (ref >60)
Glucose, Bld: 92 mg/dL (ref 65–99)
POTASSIUM: 3.9 mmol/L (ref 3.5–5.1)
Sodium: 139 mmol/L (ref 135–145)

## 2016-01-16 LAB — GLUCOSE, CAPILLARY
Glucose-Capillary: 108 mg/dL — ABNORMAL HIGH (ref 65–99)
Glucose-Capillary: 77 mg/dL (ref 65–99)

## 2016-01-16 MED ORDER — APIXABAN 5 MG PO TABS: 5.0000 mg | ORAL_TABLET | Freq: Two times a day (BID) | ORAL | Status: DC

## 2016-01-16 MED ORDER — PROPRANOLOL HCL 10 MG PO TABS: 10.0000 mg | ORAL_TABLET | Freq: Every day | ORAL | Status: DC | PRN

## 2016-01-16 MED ORDER — ACEBUTOLOL HCL 400 MG PO CAPS: 400.0000 mg | ORAL_CAPSULE | Freq: Two times a day (BID) | ORAL | Status: DC

## 2016-01-16 NOTE — Progress Notes (Signed)
PROGRESS NOTE  Subjective:    Angela [name] is a 80 y.o. female with a history of CAD, s/p prior PCI to the LAD, HLD, HTN, PVCs, rheumatoid arthritis, peptic ulcer disease, previous gastroparesis and mild aortic stenosis who presented to [facility] on 01/06/16 for gastric outlet obstruction. Hospital admission complicated by atrial fib/ flutter and cardiology consulted  Her acebutolol had been held for the past several days and her HR with her a-fib increased.  She is feeling much better now that she is back on the sectral. Has not been on anticoagulation due to high fall risk .   We discussed this in great detail this am.  She had several episodes of SVT last night ( retrograde P waves)  Echo shows mildly depressed LV function  - EF 45% Mild MR  Mild - mod AS Atrial enlargement. TR Has continued to have brief episodes of PAF Sectral has been increased to 400 BID  Objective:    Vital Signs:   Temp:  [97.7 F (36.5 C)-98.2 F (36.8 C)] 97.7 F (36.5 C) (03/13 0354) Pulse Rate:  [66-74] 74 (03/12 2127) Resp:  [16-18] 16 (03/13 0354) BP: (95-108)/(47-70) 98/50 mmHg (03/13 0354) SpO2:  [98 %-100 %] 98 % (03/13 0354)  Last BM Date: 01/14/16   24-hour weight change: Weight change:   Weight trends: Filed Weights   01/07/16 0600  Weight: 117 lb (53.071 kg)    Intake/Output:  03/12 0701 - 03/13 0700 In: 250 [P.O.:240; I.V.:10] Out: -  Total I/O In: 480 [P.O.:480] Out: -    Physical Exam: BP 98/50 mmHg  Pulse 74  Temp(Src) 97.7 F (36.5 C) (Oral)  Resp 16  Ht 5\' 2"  (1.575 m)  Wt 117 lb (53.071 kg)  BMI 21.39 kg/m2  SpO2 98%  Wt Readings from Last 3 Encounters:  01/07/16 117 lb (53.071 kg)  12/07/15 123 lb (55.792 kg)  11/01/15 125 lb (56.7 kg)    General: Vital signs reviewed and noted. Very pleasant and knowledgeable lady . Obvious RA deformities in her hands   Head: Normocephalic, atraumatic.  Eyes: conjunctivae/corneas clear.  EOM's intact.   Throat:  normal  Neck:  normal   Lungs:    clear   Heart: RR , soft systolic murmur  Abdomen:  Soft, non-tender, non-distended    Extremities: Arthritis - Rheumatoid arthritis deformities.    Neurologic: A&O X3, CN II - XII are grossly intact.   Psych: Normal     Labs: BMET:  Recent Labs  01/15/16 0408 01/16/16 0423  NA 140 139  K 4.4 3.9  CL 107 107  CO2 26 24  GLUCOSE 87 92  BUN 15 12  CREATININE 0.58 0.57  CALCIUM 9.0 8.6*    Liver function tests: No results for input(s): AST, ALT, ALKPHOS, BILITOT, PROT, ALBUMIN in the last 72 hours. No results for input(s): LIPASE, AMYLASE in the last 72 hours.  CBC:  Recent Labs  01/15/16 0408 01/16/16 0423  WBC 7.2 5.5  HGB 11.9* 11.6*  HCT 35.3* 34.0*  MCV 106.3* 106.9*  PLT 169 160    Cardiac Enzymes:  Recent Labs  01/13/16 1300 01/13/16 1940 01/14/16 0007  TROPONINI <0.03 <0.03 <0.03    Coagulation Studies: No results for input(s): LABPROT, INR in the last 72 hours.  Other: Invalid input(s): POCBNP No results for input(s): DDIMER in the last 72 hours. No results for input(s): HGBA1C in the last 72 hours. No results for input(s): CHOL, HDL, LDLCALC,  TRIG, CHOLHDL in the last 72 hours.  Recent Labs  01/13/16 1300  TSH 1.427   No results for input(s): VITAMINB12, FOLATE, FERRITIN, TIBC, IRON, RETICCTPCT in the last 72 hours.   Other results:  Tele   ( personally reviewed )  - NSR ,   Episodes of SVT - lasts about 1 min  Medications:    Infusions: . sodium chloride 10 mL/hr at 01/09/16 1828    Scheduled Medications: . acebutolol  400 mg Oral BID  . apixaban  5 mg Oral BID  . fesoterodine  8 mg Oral Daily  . fluticasone  1 spray Each Nare Daily  . folic acid  1 mg Oral Daily  . insulin aspart  0-9 Units Subcutaneous TID WC  . lidocaine  1 patch Transdermal QHS  . LORazepam  1 mg Intravenous QHS  . nystatin cream   Topical BID  . pantoprazole  40 mg Oral BID AC  . predniSONE  5 mg Oral Q  breakfast  . senna  1 tablet Oral QHS  . simvastatin  40 mg Oral q1800  . sodium chloride flush  10-40 mL Intracatheter Q12H  . zinc sulfate  220 mg Oral BID    Assessment/ Plan:   Active Problems:   Rheumatoid arthritis ([place])   Dyslipidemia   Essential hypertension, benign   Gastric outlet obstruction   Gastroparesis   Protein-calorie malnutrition, severe   Atrial fibrillation ([place])  1. Atrial fib - HR is improving since being started back on the Sectral. At this point, I do not necessarily think that we will have to start amio.   She has been well controlled with Sectral in the past. Continue with current dose. We can adjust as OP   2. Gastric outlet obstruction:   Medical therapy for now.   3. SVT:  Had several brief episodes of SVT - has retrograde P waves. Looks more c/w SVT than PAF. Continue meds. I taught her the valsalva maneuver Would also  Propranolol 10  PRN when she goes home  - she has been instructed to use this if she has a very prolonged episode of tachycardia   She should be able to go home soon.   Stable from a cardiac standpoint.    She would like to follow up with me in the office.   Disposition:  Length of Stay: 10  Thayer Headings, Brooke Bonito., MD, [facility] 01/16/2016, 11:50 AM Office [phone] Pager [phone]

## 2016-01-16 NOTE — Discharge Summary (Signed)
Physician Discharge Summary  Angela [name] [id number] DOB: [birth year] DOA: 01/06/2016  PCP: Irven Shelling, MD  Admit date: 01/06/2016 Discharge date: 01/16/2016  Recommendations for Outpatient Follow-up:  1. F/u with cardiology, Dr. Acie Fredrickson, in 1 month 2. F/u with gastroenterology, Dr. Michail Sermon, in 2 months or sooner if needed  3. Advised to eat a Acebutolol was increased to 400mg  BID and she was given a prescription for prn propranolol to use if she has sustained palpitations (greater than 5 minutes) 4. Home health PT/OT 5. PCP in 1 month for general follow up and management of macrocytic anemia (which may be related to her RA and meds)  Discharge Diagnoses:  Principal Problem:   Gastric outlet obstruction Active Problems:   Rheumatoid arthritis ([place])   Dyslipidemia   Essential hypertension, benign   Gastroparesis   Protein-calorie malnutrition, severe   Atrial fibrillation (H[facility])   Atrial flutter (HCC)   SVT (supraventricular tachycardia) ([place])   Duodenitis   Discharge Condition: stable, improved  Diet recommendation: full liquid diet with a small amount of soft foods for the next two weeks, then gradually increase the amount of soft food    Wt Readings from Last 3 Encounters:  01/07/16 53.071 kg (117 lb)  12/07/15 55.792 kg (123 lb)  11/01/15 56.7 kg (125 lb)    History of present illness:  The patient is a 80 year old female with history of coronary artery disease status post PCI, hypertension, hyperlipidemia, rheumatoid arthritis, peptic ulcer disease, aortic stenosis who was sent to the hospital by [facility], Dr. Wynetta Emery, for gastric outlet obstruction after an endoscopy on 3/4 demonstrated evidence of gastric outlet obstruction near the pylorus due to a mass or an ulcer with scarring. An NG tube was placed to suction and the patient was started on TPN. Initially there was thought that she may need surgery however, at this point the surgeons are hoping for resolution  with conservative measures.  Hospital Course:   Gastric outlet obstruction due to duodenitis.  She was made NPO and an NG tube was placed.  She was seen by general surgery and by gastroenterology.  CT abdomen and demonstrated no evidence of masses or malignancy.  She underwent EGD which demonstrated a polypoid lesion in the pyloric channel with surrounding ulcer and edema, but fortunately, her biopsy was negative for malignancy. She was placed on PPI and has been able to tolerate a full liquid diet with a small amount of soft foods.  She was advised to continue this diet for the next two weeks, then to gradually increase the amount of solid foods in her diet as tolerated.  She should follow up with Dr. Michail Sermon in about 2 months for repeat EGD.    Rheumatoid arthritis.  She was given IV steroids while NPO and should resume her home medications at discharge.  Her methotrexate was held this week.    Coronary artery disease, denied chest pressure and shortness of breath.  She should continue BB.  Her plavix was stopped because she was started on apixaban.   History of PVC, bradycardia, and atrial tachycardia and paroxysmal atrial fibrillation. Went into a-flutter with RVR to 140s-150s and also had episodes of what appears to be SVT.  Troponins neg, TSH wnl.  ECHO: Moderate AS, moderate TR, EF 45% with moderate to severe LA enlargement.  She was seen by cardiology who recommended increasing her acebutolol from 200mg  BID to 400mg  BID.  She converted to sinus rhythm and her blood pressures were low normal.  She continued to have symptomatic episodes of SVT which would last for 2-4 minutes. She was given an Rx for propranolol to use if her symptoms last longer than 5 minutes at home.   CHADs2vasc = 5 and she was started on apixaban.  She should follow up with Dr. Acie Fredrickson in 4 weeks.    Hyperlipidemia, stable, continue statin  Diarrhea, due to gastric outlet obstruction.  C diff negative  Severe protein  calorie malnutrition, she should continue ensure which she uses at home.   Deconditioned, PT recommending home health which was ordered.  She already has a home health aid.   Procedures  EGD 01/07/2016  PICC 3/4    Consults  Gastroenterology  General surgery  Cardiology  Discharge Exam: Filed Vitals:   01/15/16 2127 01/16/16 0354  BP: 95/47 98/50  Pulse: 74   Temp: 97.8 F (36.6 C) 97.7 F (36.5 C)  Resp: 16 16   Filed Vitals:   01/15/16 0500 01/15/16 1546 01/15/16 2127 01/16/16 0354  BP: 106/61 108/70 95/47 98/50   Pulse: 72 66 74   Temp: 97.8 F (36.6 C) 98.2 F (36.8 C) 97.8 F (36.6 C) 97.7 F (36.5 C)  TempSrc: Oral Oral Oral Oral  Resp: 18 18 16 16   Height:      Weight:      SpO2: 99% 100% 99% 98%     General: Adult female, No acute distress  HEENT: NCAT, MMM  Cardiovascular: RRR, 2+ pulses, warm extremities Tele: Several episodes of SVT. Generally SR.  Respiratory: CTAB, no increased WOB  Abdomen: NABS, soft, NT/ND   MSK: Decreased tone and bulk, no LEE, hands have ulnar deviation of MCP joints with joint swelling at elbows, wrists, and PIP joints with limited ROM and some decreased muscle bulk  Discharge Instructions      Discharge Instructions    Call MD for:  difficulty breathing, headache or visual disturbances    Complete by:  As directed      Call MD for:  extreme fatigue    Complete by:  As directed      Call MD for:  hives    Complete by:  As directed      Call MD for:  persistant dizziness or light-headedness    Complete by:  As directed      Call MD for:  persistant nausea and vomiting    Complete by:  As directed      Call MD for:  severe uncontrolled pain    Complete by:  As directed      Call MD for:  temperature >100.4    Complete by:  As directed      Discharge instructions    Complete by:  As directed   Please take acebutolol 400 mg twice a day.  You may take propranolol 10mg  if you have palpitations that  last longer than 5 minutes.  If you have to take propranolol, please be careful standing up or walking for 6-8 hours because this medication can cause low blood pressures and fainting.  Please follow up with Dr. Acie Fredrickson in about a month.  You should stop your plavix which has been replaced by apixaban, a different kind of blood thinner which will reduce your risk of stroke from atrial fibrillation.  Please eat a mostly liquid diet for the next two weeks and gradually increase the amount of solid foods in your diet.  You will need a repeat upper endoscopy in about 2 months.  Increase activity slowly    Complete by:  As directed             Medication List    STOP taking these medications        clopidogrel 75 MG tablet  Commonly known as:  PLAVIX     mometasone 50 MCG/ACT nasal spray  Commonly known as:  NASONEX      TAKE these medications        acebutolol 400 MG capsule  Commonly known as:  SECTRAL  Take 1 capsule (400 mg total) by mouth 2 (two) times daily.     apixaban 5 MG Tabs tablet  Commonly known as:  ELIQUIS  Take 1 tablet (5 mg total) by mouth 2 (two) times daily.     butalbital-acetaminophen-caffeine 50-325-40 MG tablet  Commonly known as:  FIORICET, ESGIC  Take 1 tablet by mouth every 6 (six) hours as needed for headache or migraine. For migraines     CALCIUM 500+D PO  Take 1 tablet by mouth 3 (three) times daily.     diclofenac sodium 1 % Gel  Commonly known as:  VOLTAREN  Apply 2 g topically 2 (two) times daily as needed. For pain     EPIPEN 2-PAK 0.3 mg/0.3 mL Soaj injection  Generic drug:  EPINEPHrine  Inject 0.3 mg into the muscle daily as needed (for allergic reaction).     fluticasone 50 MCG/ACT nasal spray  Commonly known as:  FLONASE  Place 1 spray into both nostrils daily as needed for allergies.     folic acid 1 MG tablet  Commonly known as:  FOLVITE  Take 1 mg by mouth daily.     furosemide 40 MG tablet  Commonly known as:  LASIX  Take 1  tablet (40 mg total) by mouth daily. You may take extra 1/2 tablet on days when you have extra swelling     lidocaine 5 %  Commonly known as:  LIDODERM  APPLY 1 PATCH ONTO THE SKIN DAILY REMOVE AND DISCARD PATCH WITHIN 12 HOURS OR AS DIRECTED BY PRESCRIBER     LORazepam 2 MG tablet  Commonly known as:  ATIVAN  Take 2 mg by mouth at bedtime.     methotrexate 2.5 MG tablet  Commonly known as:  RHEUMATREX  Take 15 mg by mouth once a week. On Saturdays     NITROSTAT 0.4 MG SL tablet  Generic drug:  nitroGLYCERIN  Place 0.4 mg under the tongue every 5 (five) minutes as needed for chest pain.     nystatin 100000 UNIT/ML suspension  Commonly known as:  MYCOSTATIN  Take 5 mLs by mouth 4 (four) times daily.     nystatin cream  Commonly known as:  MYCOSTATIN  Apply 1 application topically daily as needed for dry skin.     oxyCODONE-acetaminophen 7.5-325 MG tablet  Commonly known as:  PERCOCET  Take 1 tablet by mouth every 6 (six) hours as needed.     pantoprazole 40 MG tablet  Commonly known as:  PROTONIX  Take 40 mg by mouth 2 (two) times daily.     predniSONE 5 MG tablet  Commonly known as:  DELTASONE  Take 5 mg by mouth daily.     PREVIDENT 0.2 % Soln  Generic drug:  SODIUM FLUORIDE (DENTAL RINSE)  Take 1 each by mouth at bedtime. Rinses with about 1 capful nightly     propranolol 10 MG tablet  Commonly known as:  INDERAL  Take 1 tablet (  10 mg total) by mouth daily as needed (palpitations lasting more than 5 minutes).     simvastatin 40 MG tablet  Commonly known as:  ZOCOR  TAKE 1 TABLET BY MOUTH AT BEDTIME     STOOL SOFTENER PO  Take 1 tablet by mouth at bedtime.     TOVIAZ 8 MG Tb24 tablet  Generic drug:  fesoterodine  Take 8 mg by mouth daily.       Follow-up Information    Follow up with [facility].   Why:  Home Health Physical Therapy   Contact information:   [address] [place] [address] [phone]       Follow up with  Irven Shelling, MD. Schedule an appointment as soon as possible for a visit in 1 month.   Specialty:  Internal Medicine   Contact information:   [address] [place] [address] [phone]       Follow up with Lear Ng., MD In 2 months.   Specialty:  Gastroenterology   Contact information:   [id number] [place] [address] [phone]       Follow up with Nahser, Wonda Cheng, MD. Schedule an appointment as soon as possible for a visit in 1 month.   Specialty:  Cardiology   Contact information:   [place] [address] [phone]        The results of significant diagnostics from this hospitalization (including imaging, microbiology, ancillary and laboratory) are listed below for reference.    Significant Diagnostic Studies: Ct Abdomen Pelvis W Contrast  01/10/2016  CLINICAL DATA:  80 year old female with history of gastric outlet obstruction noted on endoscopy on 01/07/2016. Evaluate for potential mass versus ulcer. EXAM: CT ABDOMEN AND PELVIS WITH CONTRAST TECHNIQUE: Multidetector CT imaging of the abdomen and pelvis was performed using the standard protocol following bolus administration of intravenous contrast. CONTRAST:  171mL OMNIPAQUE IOHEXOL 300 MG/ML  SOLN COMPARISON:  CT the abdomen and pelvis 02/03/2009. FINDINGS: Lower chest: Scarring in left lower lobe. Cardiomegaly. Nasogastric tube in position. Hepatobiliary: No cystic or solid hepatic lesions. No intra or extrahepatic biliary ductal dilatation. Status post cholecystectomy. Pancreas: Mild fatty atrophy in the pancreas, most notably in the inferior aspect of the pancreatic head and pancreatic uncinate process. No pancreatic mass. No pancreatic ductal dilatation. No pancreatic or peripancreatic fluid or inflammatory changes. Spleen: Unremarkable. Adrenals/Urinary Tract: Mild diffuse cortical thinning in the kidneys bilaterally. Sub cm  low-attenuation lesion in the lower pole of the left kidney is too small to definitively characterize, but is statistically likely a tiny cyst. The appearance of the right kidney is otherwise unremarkable. Bilateral adrenal glands are normal in appearance. No hydroureteronephrosis. Urinary bladder is normal in appearance. Stomach/Bowel: Nasogastric tube extends to the antrum of the stomach. Stomach is otherwise grossly unremarkable in appearance. Specifically, no definite gastric mass identified. Accurate assessment for ulcer disease is not routinely possible on CT examinations. Stomach does not appear distended. No pathologic dilatation of small bowel or colon. Normal appendix. Vascular/Lymphatic: Atherosclerosis throughout the abdominal and pelvic vasculature, without evidence of aneurysm or dissection. No lymphadenopathy noted in the abdomen or pelvis. Reproductive: Uterus and ovaries are atrophic. Other: No significant volume of ascites.  No pneumoperitoneum. Musculoskeletal: Multilevel degenerative disc disease in the lumbar spine, most severe at L3-L4 and L4-L5. There is 9 mm of anterolisthesis of L3 upon L4 and 6 mm of anterolisthesis of L4 upon L5. There  are no aggressive appearing lytic or blastic lesions noted in the visualized portions of the skeleton. IMPRESSION: 1. No definite source for gastric outlet obstruction identified. Specifically, no definite gastric mass. 2. No acute findings in the abdomen or pelvis. 3. Extensive atherosclerosis. 4. Additional incidental findings, as above. Electronically Signed   By: Vinnie Langton M.D.   On: 01/10/2016 07:38   Dg Duanne Limerick  W/kub  01/06/2016  CLINICAL DATA:  Dysphagia EXAM: UPPER GI SERIES WITH KUB TECHNIQUE: After obtaining a scout radiograph a routine upper GI series was performed using thin and thick barium FLUOROSCOPY TIME:  Radiation Exposure Index (as provided by the fluoroscopic device): 111 d Gy cm2 If the device does not provide the exposure index:  Fluoroscopy Time (in minutes and seconds): Number of Acquired Images: COMPARISON:  Barium swallow 07/13/2009 FINDINGS: Fluoroscopic evaluation of swallowing demonstrates no fixed stricture within the esophagus. There is mild generalized tapering of the distal third of the esophagus. No fold thickening. There is an elongated stomach. No gastric emptying can be visualized during the study. The patient was left on her right side to for 15-20 minutes. Despite this, the stomach never emptied. There is retained food material throughout the stomach. IMPRESSION: No emptying of the stomach despite waiting up to 20 minutes. Findings compatible with gastric outlet obstruction. Retained food material within the stomach. Electronically Signed   By: Rolm Baptise M.D.   On: 01/06/2016 11:39    Microbiology: Recent Results (from the past 240 hour(s))  C difficile quick scan w PCR reflex     Status: None   Collection Time: 01/10/16 12:01 AM  Result Value Ref Range Status   C Diff antigen NEGATIVE NEGATIVE Final   C Diff toxin NEGATIVE NEGATIVE Final   C Diff interpretation Negative for toxigenic C. difficile  Final     Labs: Basic Metabolic Panel:  Recent Labs Lab 01/10/16 0430 01/12/16 0442 01/15/16 0408 01/16/16 0423  NA 134* 138 140 139  K 4.3 3.8 4.4 3.9  CL 107 109 107 107  CO2 23 23 26 24   GLUCOSE 203* 138* 87 92  BUN 12 17 15 12   CREATININE 0.48 0.41* 0.58 0.57  CALCIUM 8.0* 8.3* 9.0 8.6*  MG 2.2 2.0  --   --   PHOS 2.7 4.0  --   --    Liver Function Tests:  Recent Labs Lab 01/12/16 0442  AST 23  ALT 28  ALKPHOS 33*  BILITOT 0.4  PROT 4.6*  ALBUMIN 2.6*   No results for input(s): LIPASE, AMYLASE in the last 168 hours. No results for input(s): AMMONIA in the last 168 hours. CBC:  Recent Labs Lab 01/15/16 0408 01/16/16 0423  WBC 7.2 5.5  HGB 11.9* 11.6*  HCT 35.3* 34.0*  MCV 106.3* 106.9*  PLT 169 160   Cardiac Enzymes:  Recent Labs Lab 01/13/16 1300 01/13/16 1940  01/14/16 0007  TROPONINI <0.03 <0.03 <0.03   BNP: BNP (last 3 results) No results for input(s): BNP in the last 8760 hours.  ProBNP (last 3 results) No results for input(s): PROBNP in the last 8760 hours.  CBG:  Recent Labs Lab 01/15/16 1212 01/15/16 1709 01/15/16 2114 01/16/16 0746 01/16/16 1153  GLUCAP 98 116* 95 108* 77    Time coordinating discharge: 35 minutes  Signed:  Cortland Crehan  [facility] 01/16/2016, 12:37 PM

## 2016-01-16 NOTE — Progress Notes (Signed)
Patient verbalized understanding of discharge instructions. Patient is stable at discharge. 

## 2016-01-16 NOTE — Progress Notes (Signed)
Per [facility] rep, PT/OT unable to come see pt until end of week.  This CM spoke with pt who would like to start her therapy at home sooner.  Pt offered choice for [facility] companies and pt chose [facility].  [facility] rep contacted for referral.  Pt nurse contacted to alert of change. AVS updated with [facility] and [employer] rep notified that pt chose to go with [facility].  No other CM needs communicated. Marney Doctor RN,BSN,NCM [phone]

## 2016-01-16 NOTE — Progress Notes (Signed)
Physical Therapy Treatment Patient Details Name: Angela [name] MRN: [id number] DOB: 1936/02/22 Today's Date: January 25, 2016    History of Present Illness 80 year old female with history of coronary artery disease status post PCI, hypertension, hyperlipidemia, rheumatoid arthritis, peptic ulcer disease, aortic stenosis and admitted for gastric outlet obstruction due to duodenitis     PT Comments    Pt ambulated in hallway with RW and tolerated well.  Pt feels more comfortable with d/c home today.  Follow Up Recommendations  Home health PT;Supervision for mobility/OOB     Equipment Recommendations  None recommended by PT    Recommendations for Other Services       Precautions / Restrictions Precautions Precautions: Fall Precaution Comments: monitor HR    Mobility  Bed Mobility Overal bed mobility: Modified Independent       Supine to sit: HOB elevated        Transfers Overall transfer level: Needs assistance Equipment used: Rolling walker (2 wheeled) Transfers: Sit to/from Stand Sit to Stand: Min guard         General transfer comment: min/guard for safety  Ambulation/Gait Ambulation/Gait assistance: Min guard Ambulation Distance (Feet): 120 Feet Assistive device: Rolling walker (2 wheeled) Gait Pattern/deviations: Step-through pattern;Decreased stride length     General Gait Details: pt ambulating well with RW, monitored HR during ambulation which remained 66-85 bpm during session, pt pleased she did not go into afib    Stairs            Wheelchair Mobility    Modified Rankin (Stroke Patients Only)       Balance                                    Cognition Arousal/Alertness: Awake/alert Behavior During Therapy: WFL for tasks assessed/performed Overall Cognitive Status: Within Functional Limits for tasks assessed                      Exercises      General Comments        Pertinent Vitals/Pain Pain  Assessment: No/denies pain    Home Living                      Prior Function            PT Goals (current goals can now be found in the care plan section) Progress towards PT goals: Progressing toward goals    Frequency  Min 3X/week    PT Plan Current plan remains appropriate    Co-evaluation             End of Session Equipment Utilized During Treatment: Gait belt Activity Tolerance: Patient tolerated treatment well Patient left: in chair;with call bell/phone within reach;with chair alarm set;with family/visitor present     Time: 1000-1021 PT Time Calculation (min) (ACUTE ONLY): 21 min  Charges:  $Gait Training: 8-22 mins                    G Codes:      Shae Augello,KATHrine E 01-25-2016, 12:50 PM Carmelia Bake, PT, DPT 01/25/16 Pager: [phone]

## 2016-01-16 NOTE — Progress Notes (Signed)
Nutrition Follow-up  DOCUMENTATION CODES:   Severe malnutrition in context of acute illness/injury  INTERVENTION:  - Continue Soft diet - RD will monitor for additional needs if pt unable to d/c today  NUTRITION DIAGNOSIS:   Inadequate oral intake related to inability to eat as evidenced by NPO status. -resolved with diet advancement and good intakes  No new nutrition-related dx at this time.  GOAL:   Patient will meet greater than or equal to 90% of their needs -met on average  MONITOR:   PO intake, Weight trends, Labs, Skin, I & O's  ASSESSMENT:   80 y.o. female, with past medical history of coronary artery disease status post PCI, hypertension, hyperlipidemia, rheumatoid arthritis, peptic ulcer disease, aortic stenosis, patient was sent by Dr. Norwood Levo GI for abnormal upper GI series, she reports she saw Dr. Wynetta Emery yesterday, ordered upper GI series for her, results were abnormal, where she had contrast in stomach, did not pass through, soshe was instructed to come to ED, she denies any abdominal pain, fever, chills, or vomiting, but she has complaints of nausea for few month, weight loss, and diarrhea over last few days as she's been receiving oral antibiotic for tooth infection, patient had EGD in December 20suspicious for severe gastroparesis(EGD incomplete as pylorus could not be located),   3/13 TPN d/c'ed since previous assessment (d/c'ed 3/11). Diet advanced to Soft diet on 3/10 and on 3/11 pt consumed 65% of breakfast, 85% of lunch, and 95% of dinner. Per chart review, she consumed 50% of breakfast this AM. MD note from yesterday (3/12) states pt to d/c today and plan for FLD with small amounts of soft foods for the next 2 weeks until follow-up outpatient appointment.   Pt meeting needs on average with good tolerance. Medications reviewed. Labs reviewed; CBGs: 86-116 mg/dL since 3/12 AM, Ca: 8.6 mg/dL.    3/10 - NGT out since last assessment. -  Pt continues  with Clinimix E 5/15 @ 60 mL/hr with 20% lipids @ 10 mL/hr. - Diet advanced from CLD to FLD yesterday (3/9) @ 1550. - Per chart review, pt consumed 85% of breakfast, 75% of lunch, and 85% of dinner on CLD 3/8.  - Pt consumed 50% of FLD tray for breakfast this AM.  - She denies any abdominal discomfort or nausea with diet advancement and states that she is feeling better/stronger every day.  - Will monitor for ability to further advance diet and wean TPN.   3/8 - Pt receiving Clinimix E 5/15 @ 60 mL/hr with 20% lipids @ 10 mL/hr (goal rate) at this time which is providing 72 grams of protein and 1502 kcal; this fully meets nutrition needs.  - NGT remains in place but has been clamped since yesterday (3/7).  - Pt denies abdominal pain/pressure or nausea with clamping.  - She is permitted 97CB CLD every 1 hour and states that she has been tolerating sips of juice and water.  - Pt states that PA student this AM informed her she may be able to have some broth by dinner-time today. - Per GI note this AM at 0939:  Gastric outlet obstruction, presumed benign etiology, evidence of improvement   clinically and by CT scan. Discontinue NG tube when decided by surgery. Advance  diet slowly. If does not tolerate reasonable advancement of diet, repeat EGD with   consideration of repeat biopsy and/or balloon dilatation. If is able to advance diet without undue symptomatology, would still repeat EGD in 4-8 weeks. - RD  will continue to monitor diet advancement and related nutrition needs.    *Please see chart for RD notes earlier in admission.   Diet Order:  DIET SOFT Room service appropriate?: Yes; Fluid consistency:: Thin  Skin:  Reviewed, no issues  Last BM:  3/11  Height:   Ht Readings from Last 1 Encounters:  01/07/16 _0  (1.575 m)    Weight:   Wt Readings from Last 1 Encounters:  01/07/16 117 lb (53.071 kg)    Ideal Body Weight:  50 kg (kg)  BMI:   Body mass index is 21.39 kg/(m^2).  Estimated Nutritional Needs:   Kcal:  1350-1550  Protein:  65-75 grams  Fluid:  2 L/day  EDUCATION NEEDS:   No education needs identified at this time     Jarome Matin, RD, LDN Inpatient Clinical Dietitian Pager # [phone] After hours/weekend pager # [phone]

## 2016-01-16 NOTE — Progress Notes (Signed)
[facility]    [facility] is providing the following services: [employer]  If patient discharges after hours, please call [phone].   Linward Headland 01/16/2016, 9:10 AM

## 2016-01-18 DIAGNOSIS — J45909 Unspecified asthma, uncomplicated: Secondary | ICD-10-CM | POA: Diagnosis not present

## 2016-01-18 DIAGNOSIS — I2510 Atherosclerotic heart disease of native coronary artery without angina pectoris: Secondary | ICD-10-CM | POA: Diagnosis not present

## 2016-01-18 DIAGNOSIS — I10 Essential (primary) hypertension: Secondary | ICD-10-CM | POA: Diagnosis not present

## 2016-01-18 DIAGNOSIS — K219 Gastro-esophageal reflux disease without esophagitis: Secondary | ICD-10-CM | POA: Diagnosis not present

## 2016-01-18 DIAGNOSIS — K3184 Gastroparesis: Secondary | ICD-10-CM | POA: Diagnosis not present

## 2016-01-18 DIAGNOSIS — K279 Peptic ulcer, site unspecified, unspecified as acute or chronic, without hemorrhage or perforation: Secondary | ICD-10-CM | POA: Diagnosis not present

## 2016-01-18 DIAGNOSIS — K2980 Duodenitis without bleeding: Secondary | ICD-10-CM | POA: Diagnosis not present

## 2016-01-18 DIAGNOSIS — E785 Hyperlipidemia, unspecified: Secondary | ICD-10-CM | POA: Diagnosis not present

## 2016-01-18 DIAGNOSIS — E43 Unspecified severe protein-calorie malnutrition: Secondary | ICD-10-CM | POA: Diagnosis not present

## 2016-01-18 DIAGNOSIS — K311 Adult hypertrophic pyloric stenosis: Secondary | ICD-10-CM | POA: Diagnosis not present

## 2016-01-18 DIAGNOSIS — I4891 Unspecified atrial fibrillation: Secondary | ICD-10-CM | POA: Diagnosis not present

## 2016-01-18 DIAGNOSIS — M810 Age-related osteoporosis without current pathological fracture: Secondary | ICD-10-CM | POA: Diagnosis not present

## 2016-01-18 DIAGNOSIS — E669 Obesity, unspecified: Secondary | ICD-10-CM | POA: Diagnosis not present

## 2016-01-18 DIAGNOSIS — F329 Major depressive disorder, single episode, unspecified: Secondary | ICD-10-CM | POA: Diagnosis not present

## 2016-01-18 DIAGNOSIS — M069 Rheumatoid arthritis, unspecified: Secondary | ICD-10-CM | POA: Diagnosis not present

## 2016-01-20 DIAGNOSIS — K279 Peptic ulcer, site unspecified, unspecified as acute or chronic, without hemorrhage or perforation: Secondary | ICD-10-CM | POA: Diagnosis not present

## 2016-01-20 DIAGNOSIS — K311 Adult hypertrophic pyloric stenosis: Secondary | ICD-10-CM | POA: Diagnosis not present

## 2016-01-20 DIAGNOSIS — K3184 Gastroparesis: Secondary | ICD-10-CM | POA: Diagnosis not present

## 2016-01-20 DIAGNOSIS — M069 Rheumatoid arthritis, unspecified: Secondary | ICD-10-CM | POA: Diagnosis not present

## 2016-01-20 DIAGNOSIS — K2980 Duodenitis without bleeding: Secondary | ICD-10-CM | POA: Diagnosis not present

## 2016-01-20 DIAGNOSIS — I2510 Atherosclerotic heart disease of native coronary artery without angina pectoris: Secondary | ICD-10-CM | POA: Diagnosis not present

## 2016-01-23 DIAGNOSIS — K2980 Duodenitis without bleeding: Secondary | ICD-10-CM | POA: Diagnosis not present

## 2016-01-23 DIAGNOSIS — K3184 Gastroparesis: Secondary | ICD-10-CM | POA: Diagnosis not present

## 2016-01-23 DIAGNOSIS — I2510 Atherosclerotic heart disease of native coronary artery without angina pectoris: Secondary | ICD-10-CM | POA: Diagnosis not present

## 2016-01-23 DIAGNOSIS — M069 Rheumatoid arthritis, unspecified: Secondary | ICD-10-CM | POA: Diagnosis not present

## 2016-01-23 DIAGNOSIS — K279 Peptic ulcer, site unspecified, unspecified as acute or chronic, without hemorrhage or perforation: Secondary | ICD-10-CM | POA: Diagnosis not present

## 2016-01-23 DIAGNOSIS — K311 Adult hypertrophic pyloric stenosis: Secondary | ICD-10-CM | POA: Diagnosis not present

## 2016-01-24 DIAGNOSIS — I2510 Atherosclerotic heart disease of native coronary artery without angina pectoris: Secondary | ICD-10-CM | POA: Diagnosis not present

## 2016-01-24 DIAGNOSIS — K279 Peptic ulcer, site unspecified, unspecified as acute or chronic, without hemorrhage or perforation: Secondary | ICD-10-CM | POA: Diagnosis not present

## 2016-01-24 DIAGNOSIS — K3184 Gastroparesis: Secondary | ICD-10-CM | POA: Diagnosis not present

## 2016-01-24 DIAGNOSIS — K2980 Duodenitis without bleeding: Secondary | ICD-10-CM | POA: Diagnosis not present

## 2016-01-24 DIAGNOSIS — M069 Rheumatoid arthritis, unspecified: Secondary | ICD-10-CM | POA: Diagnosis not present

## 2016-01-24 DIAGNOSIS — K311 Adult hypertrophic pyloric stenosis: Secondary | ICD-10-CM | POA: Diagnosis not present

## 2016-01-25 DIAGNOSIS — M069 Rheumatoid arthritis, unspecified: Secondary | ICD-10-CM | POA: Diagnosis not present

## 2016-01-25 DIAGNOSIS — K279 Peptic ulcer, site unspecified, unspecified as acute or chronic, without hemorrhage or perforation: Secondary | ICD-10-CM | POA: Diagnosis not present

## 2016-01-25 DIAGNOSIS — K311 Adult hypertrophic pyloric stenosis: Secondary | ICD-10-CM | POA: Diagnosis not present

## 2016-01-25 DIAGNOSIS — K2980 Duodenitis without bleeding: Secondary | ICD-10-CM | POA: Diagnosis not present

## 2016-01-25 DIAGNOSIS — I2510 Atherosclerotic heart disease of native coronary artery without angina pectoris: Secondary | ICD-10-CM | POA: Diagnosis not present

## 2016-01-25 DIAGNOSIS — K3184 Gastroparesis: Secondary | ICD-10-CM | POA: Diagnosis not present

## 2016-01-27 DIAGNOSIS — M069 Rheumatoid arthritis, unspecified: Secondary | ICD-10-CM | POA: Diagnosis not present

## 2016-01-27 DIAGNOSIS — K2980 Duodenitis without bleeding: Secondary | ICD-10-CM | POA: Diagnosis not present

## 2016-01-27 DIAGNOSIS — K311 Adult hypertrophic pyloric stenosis: Secondary | ICD-10-CM | POA: Diagnosis not present

## 2016-01-27 DIAGNOSIS — I2510 Atherosclerotic heart disease of native coronary artery without angina pectoris: Secondary | ICD-10-CM | POA: Diagnosis not present

## 2016-01-27 DIAGNOSIS — K279 Peptic ulcer, site unspecified, unspecified as acute or chronic, without hemorrhage or perforation: Secondary | ICD-10-CM | POA: Diagnosis not present

## 2016-01-27 DIAGNOSIS — K3184 Gastroparesis: Secondary | ICD-10-CM | POA: Diagnosis not present

## 2016-01-29 NOTE — Progress Notes (Signed)
Cardiology Office Note:    Date:  01/30/2016   ID:  Angela [name], DOB 11-06-1935, MRN [id number]  PCP:  Irven Shelling, MD  Cardiologist:  Dr. Fransico Him  >> Patient requests FU with Dr. [name]  Electrophysiologist:  n/a  Chief Complaint  Patient presents with  . Hospitalization Follow-up    admx with gastric outlet obstruction - c/b AFib/Flutter; SVT    History of Present Illness:     Angela [name] is a 80 y.o. female with a hx of CAD, s/p prior PCI to the LAD, HL, HTN, PVCs, rheumatoid arthritis, peptic ulcer disease, aortic stenosis.  She was seen for bradycardia and Holter in 8/15 showed NSR with PACs and PVCs and ATach.  Toprol was changed to Acebutolol.  Bradycardia resolved.  Last seen by Dr. Fransico Him in 4/16.  Prior echo suggested mod AS.  Repeat limited echo demonstrated just mild AS.   I saw her in 2/17 with increased swelling in her feet.  I felt her LE edema was multifactorial and related to venous insufficiency, inactivity, diastolic dysfunction.  I asked her to take extra Furosemide prn, wear compression stockings and to keep her legs elevated.  Admitted 3/3-3/13 with gastric outlet obstruction noted on EGD.  She had an NG tube placed and underwent EGD demonstrating a polypoid lesion in the pyloric channel with surrounding ulcer and edema.  Bx was neg for malignancy.  Hospital course was c/b AFlutter with RVR and she was seen by Cardiology.  Echo demonstrated mod AS, mod TR, EF 45%, LAE.  Acebutolol was increased to 400 bid.  She converted to NSR.  She converted to NSR.  She also had episodes of PSVT. She was given prn Propranolol to use.  CHADS2-VASc=6 (CHF, vascular dz, female, age, HTN).  She was placed on Apixaban.    She returns for FU.  Here with her caregiver.  She is stable.  Her breathing is stable.  Denies chest pain.  Denies syncope.  She sleeps on 2 pillows.  There has been no change.  She denies PND.  She increased her Lasix to 60 QD x several  days 2/2 worsening edema.  She has had a couple episodes of palpitations lasting only 5 mins since DC.  She denies syncope.  Unfortunately, her 2 daughters have been sick. One daughter is expected to pass away soon.  She is under a great deal of stress.  Her gastric issues are stable.  She is having BMs and passing gas. Denies abdominal distention.    Past Medical History  Diagnosis Date  . Cervical stenosis of spine   . Spondylolisthesis of lumbar region   . A-fib ([place])   . Asthma   . Depression   . Dyslipidemia   . GERD (gastroesophageal reflux disease)   . Obesity   . PVC's (premature ventricular contractions)   . PUD (peptic ulcer disease)     can not take aspirin  . Osteoporosis     alendronate since 2012  . Macular degeneration   . Aortic stenosis     mild by echo 03/2012  . Hypertension   . CAD (coronary artery disease)     s/p PCI with BMS to mid LAD--2/08 not on aspirin due to frequent ulcers.  Repeat cath for CP 09/2012 with patent LAD stent and otherwise normal coronary arteries  . Elbow fracture, right     history of- never any surgery  . Rheumatoid arthritis(714.0)     Dr. Lenna Gilford  follows  . Impaired physical mobility     due to rheumatoid arthritis "ambulates with walker" limited free standing.    Past Surgical History  Procedure Laterality Date  . Fracture surgery  09/2011    left ankle screw and pin removed  . Cardiac catheterization  09/2012    patent LAD stent and otherwise normal coronary arteries  . Cervical fusion      limited  ROM  . Tonsillectomy    . Cataract extraction, bilateral Bilateral   . Joint replacement Bilateral     BTKA  . Hand surgery Bilateral     x4 digits 2-5 both hands  . Ankle fusion Bilateral     x2 both  . Cholecystectomy    . Esophagogastroduodenoscopy (egd) with propofol N/A 11/01/2015    Procedure: ESOPHAGOGASTRODUODENOSCOPY (EGD) WITH PROPOFOL w/ Dialtation;  Surgeon: Garlan Fair, MD;  Location: [facility] ENDOSCOPY;  Service:  Endoscopy;  Laterality: N/A;  . Esophagogastroduodenoscopy N/A 01/07/2016    Procedure: ESOPHAGOGASTRODUODENOSCOPY (EGD);  Surgeon: Wilford Corner, MD;  Location: [facility];  Service: Endoscopy;  Laterality: N/A;    Current Medications: Outpatient Prescriptions Prior to Visit  Medication Sig Dispense Refill  . apixaban (ELIQUIS) 5 MG TABS tablet Take 1 tablet (5 mg total) by mouth 2 (two) times daily. 60 tablet 3  . butalbital-acetaminophen-caffeine (FIORICET, ESGIC) 50-325-40 MG per tablet Take 1 tablet by mouth every 6 (six) hours as needed for headache or migraine. For migraines    . Calcium Carbonate-Vitamin D (CALCIUM 500+D PO) Take 1 tablet by mouth 3 (three) times daily.    . diclofenac sodium (VOLTAREN) 1 % GEL Apply 2 g topically 2 (two) times daily as needed. For pain (Patient taking differently: Apply 2 g topically 2 (two) times daily. For pain) 3 Tube 5  . Docusate Calcium (STOOL SOFTENER PO) Take 1 tablet by mouth at bedtime.    Marland Kitchen EPIPEN 2-PAK 0.3 MG/0.3ML SOAJ Inject 0.3 mg into the muscle daily as needed (for allergic reaction).     . fluticasone (FLONASE) 50 MCG/ACT nasal spray Place 1 spray into both nostrils daily as needed for allergies.   0  . folic acid (FOLVITE) 1 MG tablet Take 1 mg by mouth daily.     . furosemide (LASIX) 40 MG tablet Take 1 tablet (40 mg total) by mouth daily. You may take extra 1/2 tablet on days when you have extra swelling (Patient taking differently: Take 40 mg by mouth daily. ) 45 tablet 6  . lidocaine (LIDODERM) 5 % APPLY 1 PATCH ONTO THE SKIN DAILY REMOVE AND DISCARD PATCH WITHIN 12 HOURS OR AS DIRECTED BY PRESCRIBER (Patient taking differently: Place 1 patch onto the skin daily. APPLY NECK AND ELBOW) 90 patch 5  . LORazepam (ATIVAN) 2 MG tablet Take 2 mg by mouth at bedtime.  5  . methotrexate (RHEUMATREX) 2.5 MG tablet Take 15 mg by mouth once a week. On Saturdays    . NITROSTAT 0.4 MG SL tablet Place 0.4 mg under the tongue every 5 (five)  minutes as needed for chest pain.     Marland Kitchen nystatin cream (MYCOSTATIN) Apply 1 application topically daily as needed for dry skin.    Marland Kitchen oxyCODONE-acetaminophen (PERCOCET) 7.5-325 MG tablet Take 1 tablet by mouth every 6 (six) hours as needed. (Patient taking differently: Take 1 tablet by mouth every 6 (six) hours as needed for moderate pain. ) 120 tablet 0  . pantoprazole (PROTONIX) 40 MG tablet Take 40 mg by mouth 2 (two)  times daily.     . predniSONE (DELTASONE) 5 MG tablet Take 5 mg by mouth daily.    Marland Kitchen PREVIDENT 0.2 % SOLN Take 1 each by mouth at bedtime. Rinses with about 1 capful nightly    . propranolol (INDERAL) 10 MG tablet Take 1 tablet (10 mg total) by mouth daily as needed (palpitations lasting more than 5 minutes). 30 tablet 0  . simvastatin (ZOCOR) 40 MG tablet TAKE 1 TABLET BY MOUTH AT BEDTIME 90 tablet 0  . TOVIAZ 8 MG TB24 tablet Take 8 mg by mouth daily.    Marland Kitchen acebutolol (SECTRAL) 400 MG capsule Take 1 capsule (400 mg total) by mouth 2 (two) times daily. 60 capsule 3  . nystatin (MYCOSTATIN) 100000 UNIT/ML suspension Take 5 mLs by mouth 4 (four) times daily. Reported on 01/30/2016     No facility-administered medications prior to visit.     Allergies:   Peanut-containing drug products; Reglan; Codeine; Iohexol; Pineapple; and Shellfish allergy   Social History   Social History  . Marital Status: Widowed    Spouse Name: N/A  . Number of Children: N/A  . Years of Education: N/A   Social History Main Topics  . Smoking status: Never Smoker   . Smokeless tobacco: Never Used  . Alcohol Use: No  . Drug Use: No  . Sexual Activity: Not Asked   Other Topics Concern  . None   Social History Narrative     Family History:  The patient's family history includes Arrhythmia in her mother; Lung cancer in her mother; Melanoma in her brother. There is no history of Heart attack.   ROS:   Please see the history of present illness.    Review of Systems  Constitution: Positive for  decreased appetite, malaise/fatigue and weight loss.  HENT: Positive for headaches.   Cardiovascular: Positive for irregular heartbeat and leg swelling.  Hematologic/Lymphatic: Bruises/bleeds easily.  Musculoskeletal: Positive for joint swelling.  Gastrointestinal: Positive for abdominal pain and diarrhea.  Neurological: Positive for loss of balance.  Psychiatric/Behavioral: Positive for depression. The patient is nervous/anxious.   All other systems reviewed and are negative.   Physical Exam:    VS:  BP 90/58 mmHg  Pulse 64  Ht 5\' 2"  (1.575 m)  Wt 117 lb (53.071 kg)  BMI 21.39 kg/m2   GEN: Well nourished, well developed, in no acute distress HEENT: normal Neck: no JVD, no masses Cardiac: Normal 99991111, RRR; 1/6 systolic murmur RUSB, trace- bilateral LE edema   Respiratory:  clear to auscultation bilaterally; no   rales GI: soft, nontender  MS: bilat hands with rheumatoid deformities (ulnar deviation, swan neck) Skin: warm and dry   Neuro:  no focal deficits  Psych: Alert and oriented x 3, normal affect  Wt Readings from Last 3 Encounters:  01/30/16 117 lb (53.071 kg)  01/07/16 117 lb (53.071 kg)  12/07/15 123 lb (55.792 kg)      Studies/Labs Reviewed:     EKG:  EKG is  ordered today.  The ekg ordered today demonstrates NSR, HR 71, normal axis, septal Q waves, NSSTTW changes, QTc 447 ms, no changes.    Recent Labs: 01/12/2016: ALT 28; Magnesium 2.0 01/13/2016: TSH 1.427 01/16/2016: BUN 12; Creatinine, Ser 0.57; Hemoglobin 11.6*; Platelets 160; Potassium 3.9; Sodium 139   Recent Lipid Panel    Component Value Date/Time   CHOL 129 02/24/2015 1030   TRIG 81 01/09/2016 0515   HDL 58.80 02/24/2015 1030   CHOLHDL 2 02/24/2015 1030  VLDL 17.6 02/24/2015 1030   LDLCALC 53 02/24/2015 1030    Additional studies/ records that were reviewed today include:   Echo 01/14/16 EF 45%, diff HK, mod AS, mean 16 mmHg, mild MR, mod to severe LAE, mod TR, PASP 34 mmHg  Limited Echo  03/09/15 Mild AS, mean 13 mmHg, mod LAE  Echo 02/24/15 EF 55-60%, inf HK, Gr 2 DD, probable mod AS, mean 18 mmHg, MAC, mild MR, mild LAE, mild RAE, mild to mod TR, PASP 39 mmHg  Holter 8/15 NSR,PVCs, PACs, WCT (NSVT vs aberration)  LHC (11/13):  Proximal LAD stent patent, normal left main, circumflex and RCA, EF 50-55% with inferobasal akinesis, LVEDP 17 mm Hg  Echo (4/15):  Mild LVH, EF 60-65%, normal wall motion, grade 1 diastolic dysfunction, moderate aortic stenosis (mean 17 mm Hg), trivial AI, borderline dilated ascending aorta, severe LAE, mild RAE, mild TR, PASP 35 mm Hg  Nuclear (5/13):  Breast attenuation, no ischemia, EF 55%-low risk   ASSESSMENT:     1. Paroxysmal atrial fibrillation (HCC)   2. SVT (supraventricular tachycardia) ([place])   3. Cardiomyopathy ([place])   4. Coronary artery disease involving native coronary artery of native heart without angina pectoris   5. Aortic stenosis   6. Essential hypertension, benign   7. Bilateral edema of lower extremity   8. Anxiety     PLAN:     In order of problems listed above:  1. PAF/Flutter - Maintaining NSR.  Recent Aflutter with RVR in the setting of gastric outlet obstruction.    CHADS2-VASc=6.  Continue long term anticoagulation with Apixaban.  BP is running low and she is mildly symptomatic.  I will decrease her Acebutolol to 400 mg QAM and 200 mg QPM.  If she has more palpitations, we will need to resume 400 mg bid.  Check BMET, CBC in 1 month.   2. SVT - No apparent recurrence.   3. Cardiomyopathy - EF lower on recent echo than prior studies.  Suspect this is from RVR.  Consider repeat Echo in 2-3 mos.  4. CAD - She is now off Plavix as she is on Apixaban.  Continue statin.  No angina.   5. Aortic stenosis - Mod by recent echo.  Gradients are fairly stable.  As noted, suspect she will need a FU echo in 2-3 mos.   6. HTN - BP running low.  Decrease Acebutolol as noted.   7. Edema - Multifactorial. Continue  Lasix 40 QD. She can take an extra 20 mg prn.   8. Anxiety - As noted, her daughter is terminally ill.  She is asking for something for anxiety. I have asked her to get this from her PCP. She will call him.  She can taken an extra Lorazepam 1 mg prn until she can review with Dr. Laurann Montana.    Medication Adjustments/Labs and Tests Ordered: Current medicines are reviewed at length with the patient today.  Concerns regarding medicines are outlined above.  Medication changes, Labs and Tests ordered today are outlined in the Patient Instructions noted below. Patient Instructions  Medication Instructions:  CHANGE ACEBUTOLOL TO 400 MG IN THE MORNING AND 200 MG IN THE PM; (NEW RX SENT IN FOR THE 200 MG CAPS; DIRECTIONS STATING 2 CAPS IN AM AND 1 CAP IN PM)  Labwork: 4 WEEKS FOR BMET, CBC W/DIFF  Testing/Procedures: NONE  Follow-Up: 03/20/16 @ 11 AM WITH DR. Radford Pax   Any Other Special Instructions Will Be Listed Below (If Applicable).  If  you need a refill on your cardiac medications before your next appointment, please call your pharmacy.   Signed, Richardson Dopp, PA-C  01/30/2016 5:15 PM    [facility] [place], [place], [place]  [address] Phone: [phone]; Fax: [phone]

## 2016-01-30 ENCOUNTER — Ambulatory Visit (INDEPENDENT_AMBULATORY_CARE_PROVIDER_SITE_OTHER): Payer: Medicare Other | Admitting: Physician Assistant

## 2016-01-30 ENCOUNTER — Encounter: Payer: Self-pay | Admitting: Physician Assistant

## 2016-01-30 VITALS — BP 90/58 | HR 64 | Ht 62.0 in | Wt 117.0 lb

## 2016-01-30 DIAGNOSIS — I10 Essential (primary) hypertension: Secondary | ICD-10-CM

## 2016-01-30 DIAGNOSIS — I480 Paroxysmal atrial fibrillation: Secondary | ICD-10-CM | POA: Diagnosis not present

## 2016-01-30 DIAGNOSIS — I429 Cardiomyopathy, unspecified: Secondary | ICD-10-CM | POA: Diagnosis not present

## 2016-01-30 DIAGNOSIS — I350 Nonrheumatic aortic (valve) stenosis: Secondary | ICD-10-CM

## 2016-01-30 DIAGNOSIS — I471 Supraventricular tachycardia, unspecified: Secondary | ICD-10-CM

## 2016-01-30 DIAGNOSIS — F419 Anxiety disorder, unspecified: Secondary | ICD-10-CM

## 2016-01-30 DIAGNOSIS — I2510 Atherosclerotic heart disease of native coronary artery without angina pectoris: Secondary | ICD-10-CM

## 2016-01-30 DIAGNOSIS — R600 Localized edema: Secondary | ICD-10-CM

## 2016-01-30 DIAGNOSIS — E785 Hyperlipidemia, unspecified: Secondary | ICD-10-CM

## 2016-01-30 HISTORY — DX: Cardiomyopathy, unspecified: I42.9

## 2016-01-30 MED ORDER — ACEBUTOLOL HCL 200 MG PO CAPS: 200.0000 mg | ORAL_CAPSULE | ORAL | Status: DC

## 2016-01-30 NOTE — Patient Instructions (Addendum)
Medication Instructions:  CHANGE ACEBUTOLOL TO 400 MG IN THE MORNING AND 200 MG IN THE PM; (NEW RX SENT IN FOR THE 200 MG CAPS; DIRECTIONS STATING 2 CAPS IN AM AND 1 CAP IN PM)  Labwork: 4 WEEKS FOR BMET, CBC W/DIFF  Testing/Procedures: NONE  Follow-Up: 03/20/16 @ 11 AM WITH DR. Radford Pax   Any Other Special Instructions Will Be Listed Below (If Applicable).  If you need a refill on your cardiac medications before your next appointment, please call your pharmacy.

## 2016-01-31 DIAGNOSIS — K311 Adult hypertrophic pyloric stenosis: Secondary | ICD-10-CM | POA: Diagnosis not present

## 2016-01-31 DIAGNOSIS — M069 Rheumatoid arthritis, unspecified: Secondary | ICD-10-CM | POA: Diagnosis not present

## 2016-01-31 DIAGNOSIS — K279 Peptic ulcer, site unspecified, unspecified as acute or chronic, without hemorrhage or perforation: Secondary | ICD-10-CM | POA: Diagnosis not present

## 2016-01-31 DIAGNOSIS — K3184 Gastroparesis: Secondary | ICD-10-CM | POA: Diagnosis not present

## 2016-01-31 DIAGNOSIS — K2980 Duodenitis without bleeding: Secondary | ICD-10-CM | POA: Diagnosis not present

## 2016-01-31 DIAGNOSIS — I2510 Atherosclerotic heart disease of native coronary artery without angina pectoris: Secondary | ICD-10-CM | POA: Diagnosis not present

## 2016-02-03 DIAGNOSIS — K279 Peptic ulcer, site unspecified, unspecified as acute or chronic, without hemorrhage or perforation: Secondary | ICD-10-CM | POA: Diagnosis not present

## 2016-02-03 DIAGNOSIS — M069 Rheumatoid arthritis, unspecified: Secondary | ICD-10-CM | POA: Diagnosis not present

## 2016-02-03 DIAGNOSIS — I2510 Atherosclerotic heart disease of native coronary artery without angina pectoris: Secondary | ICD-10-CM | POA: Diagnosis not present

## 2016-02-03 DIAGNOSIS — K2980 Duodenitis without bleeding: Secondary | ICD-10-CM | POA: Diagnosis not present

## 2016-02-03 DIAGNOSIS — K3184 Gastroparesis: Secondary | ICD-10-CM | POA: Diagnosis not present

## 2016-02-03 DIAGNOSIS — K311 Adult hypertrophic pyloric stenosis: Secondary | ICD-10-CM | POA: Diagnosis not present

## 2016-02-07 DIAGNOSIS — K279 Peptic ulcer, site unspecified, unspecified as acute or chronic, without hemorrhage or perforation: Secondary | ICD-10-CM | POA: Diagnosis not present

## 2016-02-07 DIAGNOSIS — M069 Rheumatoid arthritis, unspecified: Secondary | ICD-10-CM | POA: Diagnosis not present

## 2016-02-07 DIAGNOSIS — K311 Adult hypertrophic pyloric stenosis: Secondary | ICD-10-CM | POA: Diagnosis not present

## 2016-02-07 DIAGNOSIS — K3184 Gastroparesis: Secondary | ICD-10-CM | POA: Diagnosis not present

## 2016-02-07 DIAGNOSIS — K2980 Duodenitis without bleeding: Secondary | ICD-10-CM | POA: Diagnosis not present

## 2016-02-07 DIAGNOSIS — I2510 Atherosclerotic heart disease of native coronary artery without angina pectoris: Secondary | ICD-10-CM | POA: Diagnosis not present

## 2016-02-09 DIAGNOSIS — K311 Adult hypertrophic pyloric stenosis: Secondary | ICD-10-CM | POA: Diagnosis not present

## 2016-02-09 DIAGNOSIS — K2980 Duodenitis without bleeding: Secondary | ICD-10-CM | POA: Diagnosis not present

## 2016-02-09 DIAGNOSIS — K3184 Gastroparesis: Secondary | ICD-10-CM | POA: Diagnosis not present

## 2016-02-09 DIAGNOSIS — I2510 Atherosclerotic heart disease of native coronary artery without angina pectoris: Secondary | ICD-10-CM | POA: Diagnosis not present

## 2016-02-09 DIAGNOSIS — K279 Peptic ulcer, site unspecified, unspecified as acute or chronic, without hemorrhage or perforation: Secondary | ICD-10-CM | POA: Diagnosis not present

## 2016-02-09 DIAGNOSIS — M069 Rheumatoid arthritis, unspecified: Secondary | ICD-10-CM | POA: Diagnosis not present

## 2016-02-13 DIAGNOSIS — I2510 Atherosclerotic heart disease of native coronary artery without angina pectoris: Secondary | ICD-10-CM | POA: Diagnosis not present

## 2016-02-13 DIAGNOSIS — K279 Peptic ulcer, site unspecified, unspecified as acute or chronic, without hemorrhage or perforation: Secondary | ICD-10-CM | POA: Diagnosis not present

## 2016-02-13 DIAGNOSIS — M069 Rheumatoid arthritis, unspecified: Secondary | ICD-10-CM | POA: Diagnosis not present

## 2016-02-13 DIAGNOSIS — K2980 Duodenitis without bleeding: Secondary | ICD-10-CM | POA: Diagnosis not present

## 2016-02-13 DIAGNOSIS — K3184 Gastroparesis: Secondary | ICD-10-CM | POA: Diagnosis not present

## 2016-02-13 DIAGNOSIS — K311 Adult hypertrophic pyloric stenosis: Secondary | ICD-10-CM | POA: Diagnosis not present

## 2016-02-14 DIAGNOSIS — I483 Typical atrial flutter: Secondary | ICD-10-CM | POA: Diagnosis not present

## 2016-02-14 DIAGNOSIS — K311 Adult hypertrophic pyloric stenosis: Secondary | ICD-10-CM | POA: Diagnosis not present

## 2016-02-14 DIAGNOSIS — E43 Unspecified severe protein-calorie malnutrition: Secondary | ICD-10-CM | POA: Diagnosis not present

## 2016-02-14 DIAGNOSIS — I350 Nonrheumatic aortic (valve) stenosis: Secondary | ICD-10-CM | POA: Diagnosis not present

## 2016-02-16 DIAGNOSIS — K2980 Duodenitis without bleeding: Secondary | ICD-10-CM | POA: Diagnosis not present

## 2016-02-16 DIAGNOSIS — K3184 Gastroparesis: Secondary | ICD-10-CM | POA: Diagnosis not present

## 2016-02-16 DIAGNOSIS — K311 Adult hypertrophic pyloric stenosis: Secondary | ICD-10-CM | POA: Diagnosis not present

## 2016-02-16 DIAGNOSIS — M069 Rheumatoid arthritis, unspecified: Secondary | ICD-10-CM | POA: Diagnosis not present

## 2016-02-16 DIAGNOSIS — K279 Peptic ulcer, site unspecified, unspecified as acute or chronic, without hemorrhage or perforation: Secondary | ICD-10-CM | POA: Diagnosis not present

## 2016-02-16 DIAGNOSIS — I2510 Atherosclerotic heart disease of native coronary artery without angina pectoris: Secondary | ICD-10-CM | POA: Diagnosis not present

## 2016-02-21 ENCOUNTER — Telehealth: Payer: Self-pay | Admitting: Cardiology

## 2016-02-21 DIAGNOSIS — M069 Rheumatoid arthritis, unspecified: Secondary | ICD-10-CM | POA: Diagnosis not present

## 2016-02-21 DIAGNOSIS — K279 Peptic ulcer, site unspecified, unspecified as acute or chronic, without hemorrhage or perforation: Secondary | ICD-10-CM | POA: Diagnosis not present

## 2016-02-21 DIAGNOSIS — K2980 Duodenitis without bleeding: Secondary | ICD-10-CM | POA: Diagnosis not present

## 2016-02-21 DIAGNOSIS — K3184 Gastroparesis: Secondary | ICD-10-CM | POA: Diagnosis not present

## 2016-02-21 DIAGNOSIS — K311 Adult hypertrophic pyloric stenosis: Secondary | ICD-10-CM | POA: Diagnosis not present

## 2016-02-21 DIAGNOSIS — I2510 Atherosclerotic heart disease of native coronary artery without angina pectoris: Secondary | ICD-10-CM | POA: Diagnosis not present

## 2016-02-21 MED ORDER — NITROGLYCERIN 0.4 MG SL SUBL: 0.4000 mg | SUBLINGUAL_TABLET | SUBLINGUAL | Status: AC | PRN

## 2016-02-21 NOTE — Telephone Encounter (Signed)
New message    *STAT* If patient is at the pharmacy, call can be transferred to refill team.   1. Which medications need to be refilled? (please list name of each medication and dose if known) Nitrostat 0.4mg  2. Which pharmacy/location (including street and city if local pharmacy) is medication to be sent to? [facility] [address]  3. Do they need a 30 day or 90 day supply? [place]

## 2016-02-22 DIAGNOSIS — I2510 Atherosclerotic heart disease of native coronary artery without angina pectoris: Secondary | ICD-10-CM | POA: Diagnosis not present

## 2016-02-22 DIAGNOSIS — K3184 Gastroparesis: Secondary | ICD-10-CM | POA: Diagnosis not present

## 2016-02-22 DIAGNOSIS — K2980 Duodenitis without bleeding: Secondary | ICD-10-CM | POA: Diagnosis not present

## 2016-02-22 DIAGNOSIS — M069 Rheumatoid arthritis, unspecified: Secondary | ICD-10-CM | POA: Diagnosis not present

## 2016-02-22 DIAGNOSIS — K311 Adult hypertrophic pyloric stenosis: Secondary | ICD-10-CM | POA: Diagnosis not present

## 2016-02-22 DIAGNOSIS — K279 Peptic ulcer, site unspecified, unspecified as acute or chronic, without hemorrhage or perforation: Secondary | ICD-10-CM | POA: Diagnosis not present

## 2016-02-23 DIAGNOSIS — K3184 Gastroparesis: Secondary | ICD-10-CM | POA: Diagnosis not present

## 2016-02-23 DIAGNOSIS — K2980 Duodenitis without bleeding: Secondary | ICD-10-CM | POA: Diagnosis not present

## 2016-02-23 DIAGNOSIS — I2510 Atherosclerotic heart disease of native coronary artery without angina pectoris: Secondary | ICD-10-CM | POA: Diagnosis not present

## 2016-02-23 DIAGNOSIS — K311 Adult hypertrophic pyloric stenosis: Secondary | ICD-10-CM | POA: Diagnosis not present

## 2016-02-23 DIAGNOSIS — M069 Rheumatoid arthritis, unspecified: Secondary | ICD-10-CM | POA: Diagnosis not present

## 2016-02-23 DIAGNOSIS — K279 Peptic ulcer, site unspecified, unspecified as acute or chronic, without hemorrhage or perforation: Secondary | ICD-10-CM | POA: Diagnosis not present

## 2016-02-27 ENCOUNTER — Telehealth: Payer: Self-pay | Admitting: *Deleted

## 2016-02-27 ENCOUNTER — Other Ambulatory Visit (INDEPENDENT_AMBULATORY_CARE_PROVIDER_SITE_OTHER): Payer: Medicare Other | Admitting: *Deleted

## 2016-02-27 DIAGNOSIS — R079 Chest pain, unspecified: Secondary | ICD-10-CM

## 2016-02-27 DIAGNOSIS — I480 Paroxysmal atrial fibrillation: Secondary | ICD-10-CM

## 2016-02-27 DIAGNOSIS — I4891 Unspecified atrial fibrillation: Secondary | ICD-10-CM | POA: Diagnosis not present

## 2016-02-27 DIAGNOSIS — I10 Essential (primary) hypertension: Secondary | ICD-10-CM

## 2016-02-27 LAB — CBC WITH DIFFERENTIAL/PLATELET
BASOS ABS: 67 {cells}/uL (ref 0–200)
Basophils Relative: 1 %
EOS ABS: 536 {cells}/uL — AB (ref 15–500)
Eosinophils Relative: 8 %
HEMATOCRIT: 38.6 % (ref 35.0–45.0)
Hemoglobin: 13.4 g/dL (ref 11.7–15.5)
LYMPHS PCT: 19 %
Lymphs Abs: 1273 cells/uL (ref 850–3900)
MCH: 35.6 pg — AB (ref 27.0–33.0)
MCHC: 34.7 g/dL (ref 32.0–36.0)
MCV: 102.7 fL — AB (ref 80.0–100.0)
MONO ABS: 737 {cells}/uL (ref 200–950)
MPV: 9.5 fL (ref 7.5–12.5)
Monocytes Relative: 11 %
NEUTROS PCT: 61 %
Neutro Abs: 4087 cells/uL (ref 1500–7800)
Platelets: 185 10*3/uL (ref 140–400)
RBC: 3.76 MIL/uL — ABNORMAL LOW (ref 3.80–5.10)
RDW: 14.8 % (ref 11.0–15.0)
WBC: 6.7 10*3/uL (ref 3.8–10.8)

## 2016-02-27 LAB — BASIC METABOLIC PANEL
BUN: 10 mg/dL (ref 7–25)
CO2: 25 mmol/L (ref 20–31)
Calcium: 8.8 mg/dL (ref 8.6–10.4)
Chloride: 101 mmol/L (ref 98–110)
Creat: 0.56 mg/dL — ABNORMAL LOW (ref 0.60–0.93)
GLUCOSE: 91 mg/dL (ref 65–99)
POTASSIUM: 4.1 mmol/L (ref 3.5–5.3)
Sodium: 135 mmol/L (ref 135–146)

## 2016-02-27 NOTE — Telephone Encounter (Signed)
Patient came into the office for lab work.  She reported CP to check in desk. Upon speaking with patient she tells me she is not currently experiencing CP. Experienced a couple of episodes over the last week or so.  Denies other symptoms or pain that "radiated" Has not taken NTG. Reports that she has arthritis and hasn't taken her prescribed prednisone that last few days. Advised patient to take her prednisone as prescribed.  Advised to take her NTG if she experiences CP again. Patient is going to continue to monitor and advised when to go the the ED. She will call office if she has further concerns. Patient understands that information will be sent to Dr. Radford Pax and her nurse for their [facility]. She thanks [place] for helping and answering concerns/questions.

## 2016-02-27 NOTE — Telephone Encounter (Signed)
Reviewed instructions for stress test with patient to her satisfaction. Stress test ordered for scheduling ASAP. She understands to go to ER if any further CP is experienced.  Patient was grateful for call.

## 2016-02-27 NOTE — Addendum Note (Signed)
Addended by: Harland German A on: 02/27/2016 04:44 PM   Modules accepted: Orders

## 2016-02-27 NOTE — Telephone Encounter (Signed)
Please set up for a Lexiscan myoview this week.  If she has further CP she needs to go to ER

## 2016-02-27 NOTE — Telephone Encounter (Signed)
Pt has been notified of lab results by phone with verbal understading.

## 2016-02-28 ENCOUNTER — Telehealth (HOSPITAL_COMMUNITY): Payer: Self-pay | Admitting: *Deleted

## 2016-02-28 DIAGNOSIS — F4320 Adjustment disorder, unspecified: Secondary | ICD-10-CM | POA: Diagnosis not present

## 2016-02-28 DIAGNOSIS — K3184 Gastroparesis: Secondary | ICD-10-CM | POA: Diagnosis not present

## 2016-02-28 DIAGNOSIS — M0579 Rheumatoid arthritis with rheumatoid factor of multiple sites without organ or systems involvement: Secondary | ICD-10-CM | POA: Diagnosis not present

## 2016-02-28 DIAGNOSIS — Z79899 Other long term (current) drug therapy: Secondary | ICD-10-CM | POA: Diagnosis not present

## 2016-02-28 DIAGNOSIS — M2550 Pain in unspecified joint: Secondary | ICD-10-CM | POA: Diagnosis not present

## 2016-02-28 DIAGNOSIS — M25511 Pain in right shoulder: Secondary | ICD-10-CM | POA: Diagnosis not present

## 2016-02-28 NOTE — Telephone Encounter (Signed)
Patient given detailed instructions per Myocardial Perfusion Study Information Sheet for the test on 02/29/16 at 7:45. Patient notified to arrive 15 minutes early and that it is imperative to arrive on time for appointment to keep from having the test rescheduled.  If you need to cancel or reschedule your appointment, please call the office within 24 hours of your appointment. Failure to do so may result in a cancellation of your appointment, and a $50 no show fee. Patient verbalized understanding.Angela [name]

## 2016-02-29 ENCOUNTER — Ambulatory Visit (HOSPITAL_COMMUNITY): Payer: Medicare Other | Attending: Cardiovascular Disease

## 2016-02-29 DIAGNOSIS — R079 Chest pain, unspecified: Secondary | ICD-10-CM | POA: Insufficient documentation

## 2016-02-29 DIAGNOSIS — I10 Essential (primary) hypertension: Secondary | ICD-10-CM | POA: Insufficient documentation

## 2016-02-29 DIAGNOSIS — R9439 Abnormal result of other cardiovascular function study: Secondary | ICD-10-CM | POA: Diagnosis not present

## 2016-02-29 LAB — MYOCARDIAL PERFUSION IMAGING
CHL CUP RESTING HR STRESS: 69 {beats}/min
CSEPPHR: 90 {beats}/min
LV dias vol: 76 mL (ref 46–106)
LVSYSVOL: 42 mL
RATE: 0.4
SDS: 7
SRS: 11
SSS: 18
TID: 1.03

## 2016-02-29 MED ORDER — TECHNETIUM TC 99M SESTAMIBI GENERIC - CARDIOLITE
31.4000 | Freq: Once | INTRAVENOUS | Status: AC | PRN
  Administered 2016-02-29: 31 via INTRAVENOUS

## 2016-02-29 MED ORDER — REGADENOSON 0.4 MG/5ML IV SOLN
0.4000 mg | Freq: Once | INTRAVENOUS | Status: AC
  Administered 2016-02-29: 0.4 mg via INTRAVENOUS

## 2016-02-29 MED ORDER — TECHNETIUM TC 99M SESTAMIBI GENERIC - CARDIOLITE
11.0000 | Freq: Once | INTRAVENOUS | Status: AC | PRN
  Administered 2016-02-29: 11 via INTRAVENOUS

## 2016-02-29 NOTE — Telephone Encounter (Signed)
Myoview is scheduled 4/26.

## 2016-03-01 ENCOUNTER — Telehealth: Payer: Self-pay

## 2016-03-01 NOTE — Telephone Encounter (Signed)
I am fine with that 

## 2016-03-01 NOTE — Telephone Encounter (Signed)
Called patient to discuss myoview results. OV scheduled with Richardson Dopp 5/1 to discuss catheterization. Patient agrees with treatment plan.  Patient would like to have Dr. Acie Fredrickson has primary cardiologist. She saw him in the hospital and "really liked his demeanor."  To Dr. Radford Pax and Dr. Acie Fredrickson.

## 2016-03-04 NOTE — Progress Notes (Signed)
Cardiology Office Note:    Date:  03/05/2016   ID:  Angela [name], DOB June 21, 1936, MRN [id number]  PCP:  Irven Shelling, MD  Cardiologist:  Dr. Fransico Him  >> Patient requests FU with Dr. [name]  Electrophysiologist:  n/a  Chief Complaint  Patient presents with  . Chest Pain    Myoview Abnormal    History of Present Illness:     Angela [name] is a 80 y.o. female with a hx of CAD, s/p prior PCI to the LAD, HL, HTN, PVCs, rheumatoid arthritis, peptic ulcer disease, aortic stenosis.  She was seen for bradycardia and Holter in 8/15 showed NSR with PACs and PVCs and ATach.  Toprol was changed to Acebutolol.  Bradycardia resolved.  Last seen by Dr. Fransico Him in 4/16.  Prior echo suggested mod AS.  Repeat limited echo demonstrated just mild AS.   I saw her in 2/17 with increased swelling in her feet.  I felt her LE edema was multifactorial and related to venous insufficiency, inactivity, diastolic dysfunction.  I asked her to take extra Furosemide prn, wear compression stockings and to keep her legs elevated.  Admitted 3/17 with gastric outlet obstruction noted on EGD.  She had an NG tube placed and underwent EGD demonstrating a polypoid lesion in the pyloric channel with surrounding ulcer and edema.  Bx was neg for malignancy.  Hospital course was c/b AFlutter with RVR and she was seen by Cardiology.  Echo demonstrated mod AS, mod TR, EF 45%, LAE.  Acebutolol was increased to 400 bid.  She converted to NSR.   She also had episodes of PSVT. She was given prn Propranolol to use.  CHADS2-VASc=6 (CHF, vascular dz, female, age, HTN).  She was placed on Apixaban.    Last seen by me 01/30/16.  She was in NSR.  I decreased her Acebutolol due to low BP.    She walked in recently with complaints of CP.  Myoview was arranged.  This demonstrated inferolateral scar with peri-infarct ischemia.  It was felt to be intermediate risk and Dr. Fransico Him recommended that she undergo LHC to  evaluate.  She returns to discuss the procedure.    Since her Myoview, she has had no further chest pain.  She feels fatigued and dizzy at times.  Denies any significant change in her breathing.  Denies syncope, PND.  LE edema is chronic and stable.     Past Medical History  Diagnosis Date  . Cervical stenosis of spine   . Spondylolisthesis of lumbar region   . A-fib ([place])   . Asthma   . Depression   . Dyslipidemia   . GERD (gastroesophageal reflux disease)   . Obesity   . PVC's (premature ventricular contractions)   . PUD (peptic ulcer disease)     can not take aspirin  . Osteoporosis     alendronate since 2012  . Macular degeneration   . Aortic stenosis     mild by echo 03/2012  . Hypertension   . CAD (coronary artery disease)     s/p PCI with BMS to mid LAD--2/08 not on aspirin due to frequent ulcers.  Repeat cath for CP 09/2012 with patent LAD stent and otherwise normal coronary arteries  . Elbow fracture, right     history of- never any surgery  . Rheumatoid arthritis(714.0)     Dr. Lenna Gilford follows  . Impaired physical mobility     due to rheumatoid arthritis "ambulates with walker" limited  free standing.    Past Surgical History  Procedure Laterality Date  . Fracture surgery  09/2011    left ankle screw and pin removed  . Cardiac catheterization  09/2012    patent LAD stent and otherwise normal coronary arteries  . Cervical fusion      limited  ROM  . Tonsillectomy    . Cataract extraction, bilateral Bilateral   . Joint replacement Bilateral     BTKA  . Hand surgery Bilateral     x4 digits 2-5 both hands  . Ankle fusion Bilateral     x2 both  . Cholecystectomy    . Esophagogastroduodenoscopy (egd) with propofol N/A 11/01/2015    Procedure: ESOPHAGOGASTRODUODENOSCOPY (EGD) WITH PROPOFOL w/ Dialtation;  Surgeon: Garlan Fair, MD;  Location: [facility] ENDOSCOPY;  Service: Endoscopy;  Laterality: N/A;  . Esophagogastroduodenoscopy N/A 01/07/2016    Procedure:  ESOPHAGOGASTRODUODENOSCOPY (EGD);  Surgeon: Wilford Corner, MD;  Location: [facility];  Service: Endoscopy;  Laterality: N/A;    Current Medications: Outpatient Prescriptions Prior to Visit  Medication Sig Dispense Refill  . apixaban (ELIQUIS) 5 MG TABS tablet Take 1 tablet (5 mg total) by mouth 2 (two) times daily. 60 tablet 3  . butalbital-acetaminophen-caffeine (FIORICET, ESGIC) 50-325-40 MG per tablet Take 1 tablet by mouth every 6 (six) hours as needed for headache or migraine. For migraines    . Calcium Carbonate-Vitamin D (CALCIUM 500+D PO) Take 1 tablet by mouth 2 (two) times daily.     . DENTAGEL 1.1 % GEL dental gel Place 1 application onto teeth at bedtime.     . diclofenac sodium (VOLTAREN) 1 % GEL Apply 2 g topically 2 (two) times daily as needed. For pain (Patient taking differently: Apply 2 g topically 2 (two) times daily as needed (for pain). For pain) 3 Tube 5  . Docusate Calcium (STOOL SOFTENER PO) Take 1 tablet by mouth at bedtime.    Marland Kitchen EPIPEN 2-PAK 0.3 MG/0.3ML SOAJ Inject 0.3 mg into the muscle daily as needed (for allergic reaction).     . fluticasone (FLONASE) 50 MCG/ACT nasal spray Place 1 spray into both nostrils daily as needed for allergies.   0  . folic acid (FOLVITE) 1 MG tablet Take 1 mg by mouth daily.     . furosemide (LASIX) 40 MG tablet Take 1 tablet (40 mg total) by mouth daily. You may take extra 1/2 tablet on days when you have extra swelling (Patient taking differently: Take 40 mg by mouth daily. ) 45 tablet 6  . lidocaine (LIDODERM) 5 % APPLY 1 PATCH ONTO THE SKIN DAILY REMOVE AND DISCARD PATCH WITHIN 12 HOURS OR AS DIRECTED BY PRESCRIBER (Patient taking differently: Place 1 patch onto the skin every 12 (twelve) hours. APPLY NECK AND ELBOW) 90 patch 5  . LORazepam (ATIVAN) 2 MG tablet Take 2 mg by mouth at bedtime.  5  . methotrexate (RHEUMATREX) 2.5 MG tablet Take 15 mg by mouth every Saturday.     . nitroGLYCERIN (NITROSTAT) 0.4 MG SL tablet Place 1  tablet (0.4 mg total) under the tongue every 5 (five) minutes as needed for chest pain. 25 tablet 1  . nystatin cream (MYCOSTATIN) Apply 1 application topically daily as needed for dry skin.    Marland Kitchen oxyCODONE-acetaminophen (PERCOCET) 7.5-325 MG tablet Take 1 tablet by mouth every 6 (six) hours as needed. (Patient taking differently: Take 1 tablet by mouth every 6 (six) hours as needed for moderate pain. ) 120 tablet 0  . pantoprazole (PROTONIX)  40 MG tablet Take 40 mg by mouth 2 (two) times daily.     . predniSONE (DELTASONE) 5 MG tablet Take 5 mg by mouth daily with breakfast.     . PREVIDENT 0.2 % SOLN Take 1 each by mouth at bedtime. Rinses with about 1 capful nightly    . propranolol (INDERAL) 10 MG tablet Take 1 tablet (10 mg total) by mouth daily as needed (palpitations lasting more than 5 minutes). 30 tablet 0  . simvastatin (ZOCOR) 40 MG tablet TAKE 1 TABLET BY MOUTH AT BEDTIME 90 tablet 0  . TOVIAZ 8 MG TB24 tablet Take 8 mg by mouth daily.    Marland Kitchen acebutolol (SECTRAL) 200 MG capsule Take 1 capsule (200 mg total) by mouth as directed. 2 caps in AM and 1 cap in the PM 90 capsule 11   No facility-administered medications prior to visit.     Allergies:   Peanut-containing drug products; Reglan; Clindamycin/lincomycin; Codeine; Iohexol; Pineapple; and Shellfish allergy   Social History   Social History  . Marital Status: Widowed    Spouse Name: N/A  . Number of Children: N/A  . Years of Education: N/A   Social History Main Topics  . Smoking status: Never Smoker   . Smokeless tobacco: Never Used  . Alcohol Use: No  . Drug Use: No  . Sexual Activity: Not Asked   Other Topics Concern  . None   Social History Narrative     Family History:  The patient's family history includes Arrhythmia in her mother; Lung cancer in her mother; Melanoma in her brother. There is no history of Heart attack.   ROS:   Please see the history of present illness.    Review of Systems  Constitution:  Positive for decreased appetite, malaise/fatigue and weight loss.  Cardiovascular: Positive for irregular heartbeat and leg swelling.  Hematologic/Lymphatic: Bruises/bleeds easily.  Musculoskeletal: Positive for back pain, joint pain, joint swelling and myalgias.  Gastrointestinal: Positive for constipation and diarrhea.  Neurological: Positive for dizziness and loss of balance.  Psychiatric/Behavioral: The patient is nervous/anxious.   All other systems reviewed and are negative.   Physical Exam:    VS:  BP 100/50 mmHg  Pulse 64  Ht 5\' 2"  (1.575 m)  Wt 112 lb 1.9 oz (50.857 kg)  BMI 20.50 kg/m2   GEN: Well nourished, well developed, in no acute distress HEENT: normal Neck: no JVD, no masses Cardiac: Normal S1/S2, irreg rhythm; 1/6 systolic murmur RUSB, trace- 1+ bilateral LE edema   Respiratory:  clear to auscultation bilaterally; no   rales GI: soft, nontender  MS: bilat hands with rheumatoid deformities (ulnar deviation, swan neck) Skin: warm and dry   Neuro:  no focal deficits  Psych: Alert and oriented x 3, normal affect  Wt Readings from Last 3 Encounters:  03/05/16 112 lb 1.9 oz (50.857 kg)  02/29/16 117 lb (53.071 kg)  01/30/16 117 lb (53.071 kg)      Studies/Labs Reviewed:     EKG:  EKG is  Not ordered today.  The ekg ordered today demonstrates n/a   Recent Labs: 01/12/2016: ALT 28; Magnesium 2.0 01/13/2016: TSH 1.427 02/27/2016: BUN 10; Creat 0.56*; Potassium 4.1; Sodium 135 03/05/2016: Hemoglobin 13.5; Platelets 185   Recent Lipid Panel    Component Value Date/Time   CHOL 129 02/24/2015 1030   TRIG 81 01/09/2016 0515   HDL 58.80 02/24/2015 1030   CHOLHDL 2 02/24/2015 1030   VLDL 17.6 02/24/2015 1030   LDLCALC 53  02/24/2015 1030    Additional studies/ records that were reviewed today include:   Myoview 02/29/16 EF 45%, inf AK, inferolateral scar with peri-infarct ischemia, intermediate to high risk study  Echo 01/14/16 EF 45%, diff HK, mod AS, mean 16  mmHg, mild MR, mod to severe LAE, mod TR, PASP 34 mmHg  Limited Echo 03/09/15 Mild AS, mean 13 mmHg, mod LAE  Echo 02/24/15 EF 55-60%, inf HK, Gr 2 DD, probable mod AS, mean 18 mmHg, MAC, mild MR, mild LAE, mild RAE, mild to mod TR, PASP 39 mmHg  Holter 8/15 NSR,PVCs, PACs, WCT (NSVT vs aberration)  LHC (11/13):  Proximal LAD stent patent, normal left main, circumflex and RCA, EF 50-55% with inferobasal akinesis, LVEDP 17 mm Hg  Echo (4/15):  Mild LVH, EF 60-65%, normal wall motion, grade 1 diastolic dysfunction, moderate aortic stenosis (mean 17 mm Hg), trivial AI, borderline dilated ascending aorta, severe LAE, mild RAE, mild TR, PASP 35 mm Hg  Nuclear (5/13):  Breast attenuation, no ischemia, EF 55%-low risk   ASSESSMENT:     1. Paroxysmal atrial fibrillation (HCC)   2. SVT (supraventricular tachycardia) ([place])   3. Cardiomyopathy ([place])   4. Coronary artery disease involving native coronary artery of native heart with angina pectoris ([place])   5. Aortic stenosis   6. Essential hypertension, benign   7. Bilateral edema of lower extremity     PLAN:     In order of problems listed above:  1. PAF/Flutter -  In NSR at [facility].  CHADS2-VASc=6.  Continue long term anticoagulation with Apixaban.  She will hold her Eliquis x 2 days prior to cath.  No hx of CVA.    2. SVT - No apparent recurrence.   3. Cardiomyopathy - EF lower on recent echo than prior studies.  Suspect this is from RVR.  Consider repeat Echo in 2-3 mos - depending upon results of cath.  4. CAD - She is now off Plavix as she is on Apixaban.  Continue statin.    Recent Myoview intermediate risk with inferolateral scar with peri-infarct ischemia. Dr. Radford Pax has recommended proceeding with cardiac cath.  Risks and benefits of cardiac catheterization have been discussed with the patient.  These include bleeding, infection, kidney damage, stroke, heart attack, death.  The patient understands these risks and is willing to  proceed.  Arrange LHC later this week.  Pre cath labs to be checked today.  Keep FU with Dr. Acie Fredrickson as planned post cath.  5. Aortic stenosis - Mod by recent echo.  Reviewed by Dr. Radford Pax and felt to be mild.  Gradients are fairly stable.     6. HTN - Controlled.   7. Edema - Multifactorial. Continue Lasix 40 QD. She can take an extra 20 mg prn.    Medication Adjustments/Labs and Tests Ordered: Current medicines are reviewed at length with the patient today.  Concerns regarding medicines are outlined above.  Medication changes, Labs and Tests ordered today are outlined in the Patient Instructions noted below. Patient Instructions  Medication Instructions:  Your physician recommends that you continue on your current medications as directed. Please refer to the Current Medication list given to you today. If you need a refill on your cardiac medications before your next appointment, please call your pharmacy. Labwork: CBC BMET AND PTINR  Testing/Procedures:  SEE LETTER FOR LEFT HEART CATH 03/07/16.. Follow-Up:   ALREADY  SCHEDULED  Any Other Special Instructions Will Be Listed Below (If Applicable).   Signed, Richardson Dopp,  PA-C  03/05/2016 3:46 PM    [facility] [place], [place], [place]  [address] Phone: [phone]; Fax: [phone]

## 2016-03-05 ENCOUNTER — Encounter: Payer: Self-pay | Admitting: Physician Assistant

## 2016-03-05 ENCOUNTER — Encounter: Payer: Self-pay | Admitting: *Deleted

## 2016-03-05 ENCOUNTER — Ambulatory Visit (INDEPENDENT_AMBULATORY_CARE_PROVIDER_SITE_OTHER): Payer: Medicare Other | Admitting: Physician Assistant

## 2016-03-05 ENCOUNTER — Ambulatory Visit: Payer: Medicare Other | Admitting: Cardiology

## 2016-03-05 VITALS — BP 100/50 | HR 64 | Ht 62.0 in | Wt 112.1 lb

## 2016-03-05 DIAGNOSIS — R9439 Abnormal result of other cardiovascular function study: Secondary | ICD-10-CM | POA: Diagnosis not present

## 2016-03-05 DIAGNOSIS — I10 Essential (primary) hypertension: Secondary | ICD-10-CM

## 2016-03-05 DIAGNOSIS — R600 Localized edema: Secondary | ICD-10-CM

## 2016-03-05 DIAGNOSIS — R079 Chest pain, unspecified: Secondary | ICD-10-CM | POA: Diagnosis not present

## 2016-03-05 DIAGNOSIS — I471 Supraventricular tachycardia: Secondary | ICD-10-CM

## 2016-03-05 DIAGNOSIS — I429 Cardiomyopathy, unspecified: Secondary | ICD-10-CM | POA: Diagnosis not present

## 2016-03-05 DIAGNOSIS — I350 Nonrheumatic aortic (valve) stenosis: Secondary | ICD-10-CM

## 2016-03-05 DIAGNOSIS — I480 Paroxysmal atrial fibrillation: Secondary | ICD-10-CM | POA: Diagnosis not present

## 2016-03-05 DIAGNOSIS — I25119 Atherosclerotic heart disease of native coronary artery with unspecified angina pectoris: Secondary | ICD-10-CM

## 2016-03-05 LAB — BASIC METABOLIC PANEL
BUN: 12 mg/dL (ref 7–25)
CALCIUM: 9 mg/dL (ref 8.6–10.4)
CO2: 23 mmol/L (ref 20–31)
CREATININE: 0.64 mg/dL (ref 0.60–0.93)
Chloride: 102 mmol/L (ref 98–110)
Glucose, Bld: 105 mg/dL — ABNORMAL HIGH (ref 65–99)
Potassium: 3.8 mmol/L (ref 3.5–5.3)
SODIUM: 136 mmol/L (ref 135–146)

## 2016-03-05 LAB — PROTIME-INR
INR: 1.27 (ref <1.50)
PROTHROMBIN TIME: 16.1 s — AB (ref 11.6–15.2)

## 2016-03-05 LAB — CBC
HCT: 39.4 % (ref 35.0–45.0)
Hemoglobin: 13.5 g/dL (ref 11.7–15.5)
MCH: 35.2 pg — ABNORMAL HIGH (ref 27.0–33.0)
MCHC: 34.3 g/dL (ref 32.0–36.0)
MCV: 102.9 fL — AB (ref 80.0–100.0)
MPV: 10.2 fL (ref 7.5–12.5)
PLATELETS: 185 10*3/uL (ref 140–400)
RBC: 3.83 MIL/uL (ref 3.80–5.10)
RDW: 15.1 % — AB (ref 11.0–15.0)
WBC: 6.1 10*3/uL (ref 3.8–10.8)

## 2016-03-05 NOTE — Patient Instructions (Addendum)
Medication Instructions:  Your physician recommends that you continue on your current medications as directed. Please refer to the Current Medication list given to you today. If you need a refill on your cardiac medications before your next appointment, please call your pharmacy. Labwork: CBC BMET AND PTINR  Testing/Procedures:  SEE LETTER FOR LEFT HEART CATH 03/07/16.. Follow-Up:   ALREADY  SCHEDULED  Any Other Special Instructions Will Be Listed Below (If Applicable).

## 2016-03-06 ENCOUNTER — Ambulatory Visit: Payer: Medicare Other | Admitting: Nurse Practitioner

## 2016-03-07 ENCOUNTER — Ambulatory Visit (HOSPITAL_COMMUNITY)
Admission: RE | Admit: 2016-03-07 | Discharge: 2016-03-07 | Disposition: A | Discharge status: Home / Self Care | Payer: Medicare Other | Source: Ambulatory Visit | Attending: Cardiology | Admitting: Cardiology

## 2016-03-07 ENCOUNTER — Encounter (HOSPITAL_COMMUNITY)
Admission: RE | Disposition: A | Discharge status: Home / Self Care | Payer: Self-pay | Source: Ambulatory Visit | Attending: Cardiology

## 2016-03-07 ENCOUNTER — Encounter (HOSPITAL_COMMUNITY): Payer: Self-pay | Admitting: Cardiology

## 2016-03-07 DIAGNOSIS — Z955 Presence of coronary angioplasty implant and graft: Secondary | ICD-10-CM | POA: Diagnosis not present

## 2016-03-07 DIAGNOSIS — Z6820 Body mass index (BMI) 20.0-20.9, adult: Secondary | ICD-10-CM | POA: Diagnosis not present

## 2016-03-07 DIAGNOSIS — I350 Nonrheumatic aortic (valve) stenosis: Secondary | ICD-10-CM | POA: Insufficient documentation

## 2016-03-07 DIAGNOSIS — I471 Supraventricular tachycardia: Secondary | ICD-10-CM | POA: Diagnosis not present

## 2016-03-07 DIAGNOSIS — I480 Paroxysmal atrial fibrillation: Secondary | ICD-10-CM | POA: Diagnosis not present

## 2016-03-07 DIAGNOSIS — I4892 Unspecified atrial flutter: Secondary | ICD-10-CM | POA: Diagnosis not present

## 2016-03-07 DIAGNOSIS — Z8711 Personal history of peptic ulcer disease: Secondary | ICD-10-CM | POA: Diagnosis not present

## 2016-03-07 DIAGNOSIS — K219 Gastro-esophageal reflux disease without esophagitis: Secondary | ICD-10-CM | POA: Insufficient documentation

## 2016-03-07 DIAGNOSIS — R600 Localized edema: Secondary | ICD-10-CM | POA: Insufficient documentation

## 2016-03-07 DIAGNOSIS — I429 Cardiomyopathy, unspecified: Secondary | ICD-10-CM | POA: Insufficient documentation

## 2016-03-07 DIAGNOSIS — M069 Rheumatoid arthritis, unspecified: Secondary | ICD-10-CM | POA: Insufficient documentation

## 2016-03-07 DIAGNOSIS — J45909 Unspecified asthma, uncomplicated: Secondary | ICD-10-CM | POA: Diagnosis not present

## 2016-03-07 DIAGNOSIS — I10 Essential (primary) hypertension: Secondary | ICD-10-CM | POA: Diagnosis not present

## 2016-03-07 DIAGNOSIS — H3530 Unspecified macular degeneration: Secondary | ICD-10-CM | POA: Insufficient documentation

## 2016-03-07 DIAGNOSIS — R931 Abnormal findings on diagnostic imaging of heart and coronary circulation: Secondary | ICD-10-CM

## 2016-03-07 DIAGNOSIS — Z7901 Long term (current) use of anticoagulants: Secondary | ICD-10-CM | POA: Diagnosis not present

## 2016-03-07 DIAGNOSIS — E669 Obesity, unspecified: Secondary | ICD-10-CM | POA: Diagnosis not present

## 2016-03-07 DIAGNOSIS — I2510 Atherosclerotic heart disease of native coronary artery without angina pectoris: Secondary | ICD-10-CM | POA: Diagnosis not present

## 2016-03-07 DIAGNOSIS — R9439 Abnormal result of other cardiovascular function study: Secondary | ICD-10-CM | POA: Diagnosis present

## 2016-03-07 DIAGNOSIS — I25119 Atherosclerotic heart disease of native coronary artery with unspecified angina pectoris: Secondary | ICD-10-CM

## 2016-03-07 DIAGNOSIS — F329 Major depressive disorder, single episode, unspecified: Secondary | ICD-10-CM | POA: Insufficient documentation

## 2016-03-07 DIAGNOSIS — I493 Ventricular premature depolarization: Secondary | ICD-10-CM | POA: Insufficient documentation

## 2016-03-07 DIAGNOSIS — E785 Hyperlipidemia, unspecified: Secondary | ICD-10-CM | POA: Diagnosis not present

## 2016-03-07 HISTORY — PX: CARDIAC CATHETERIZATION: SHX172

## 2016-03-07 SURGERY — LEFT HEART CATH AND CORONARY ANGIOGRAPHY
Anesthesia: LOCAL

## 2016-03-07 MED ORDER — SODIUM CHLORIDE 0.9% FLUSH
3.0000 mL | Freq: Two times a day (BID) | INTRAVENOUS | Status: DC
Start: 2016-03-07 — End: 2016-03-07

## 2016-03-07 MED ORDER — METHYLPREDNISOLONE SODIUM SUCC 125 MG IJ SOLR
INTRAMUSCULAR | Status: AC
  Filled 2016-03-07: qty 2

## 2016-03-07 MED ORDER — SODIUM CHLORIDE 0.9% FLUSH: 3.0000 mL | INTRAVENOUS | Status: DC | PRN

## 2016-03-07 MED ORDER — MIDAZOLAM HCL 2 MG/2ML IJ SOLN
INTRAMUSCULAR | Status: AC
  Filled 2016-03-07: qty 2

## 2016-03-07 MED ORDER — MIDAZOLAM HCL 2 MG/2ML IJ SOLN
INTRAMUSCULAR | Status: DC | PRN
  Administered 2016-03-07: 1 mg via INTRAVENOUS

## 2016-03-07 MED ORDER — FENTANYL CITRATE (PF) 100 MCG/2ML IJ SOLN
INTRAMUSCULAR | Status: DC | PRN
  Administered 2016-03-07: 25 ug via INTRAVENOUS

## 2016-03-07 MED ORDER — FAMOTIDINE IN NACL 20-0.9 MG/50ML-% IV SOLN
INTRAVENOUS | Status: AC
  Administered 2016-03-07: 20 mg via INTRAVENOUS
  Filled 2016-03-07: qty 50

## 2016-03-07 MED ORDER — DIPHENHYDRAMINE HCL 50 MG/ML IJ SOLN
INTRAMUSCULAR | Status: AC
  Administered 2016-03-07: 25 mg via INTRAVENOUS
  Filled 2016-03-07: qty 1

## 2016-03-07 MED ORDER — FAMOTIDINE IN NACL 20-0.9 MG/50ML-% IV SOLN
20.0000 mg | Freq: Once | INTRAVENOUS | Status: AC
  Administered 2016-03-07: 20 mg via INTRAVENOUS

## 2016-03-07 MED ORDER — FENTANYL CITRATE (PF) 100 MCG/2ML IJ SOLN
INTRAMUSCULAR | Status: AC
  Filled 2016-03-07: qty 2

## 2016-03-07 MED ORDER — IOPAMIDOL (ISOVUE-370) INJECTION 76%
INTRAVENOUS | Status: DC | PRN
  Administered 2016-03-07: 60 mL via INTRA_ARTERIAL

## 2016-03-07 MED ORDER — ASPIRIN 81 MG PO CHEW
CHEWABLE_TABLET | ORAL | Status: AC
  Administered 2016-03-07: 81 mg via ORAL
  Filled 2016-03-07: qty 1

## 2016-03-07 MED ORDER — SODIUM CHLORIDE 0.9 % IV SOLN: 250.0000 mL | INTRAVENOUS | Status: DC | PRN

## 2016-03-07 MED ORDER — LIDOCAINE HCL (PF) 1 % IJ SOLN
INTRAMUSCULAR | Status: DC | PRN
  Administered 2016-03-07: 15 mL via INTRADERMAL

## 2016-03-07 MED ORDER — DIPHENHYDRAMINE HCL 50 MG/ML IJ SOLN
25.0000 mg | Freq: Once | INTRAMUSCULAR | Status: AC
Start: 2016-03-07 — End: 2016-03-07
  Administered 2016-03-07: 25 mg via INTRAVENOUS

## 2016-03-07 MED ORDER — HEPARIN (PORCINE) IN NACL 2-0.9 UNIT/ML-% IJ SOLN
INTRAMUSCULAR | Status: AC
  Filled 2016-03-07: qty 1000

## 2016-03-07 MED ORDER — SODIUM CHLORIDE 0.9 % IV SOLN
INTRAVENOUS | Status: DC
  Administered 2016-03-07: 07:00:00 via INTRAVENOUS

## 2016-03-07 MED ORDER — LIDOCAINE HCL (PF) 1 % IJ SOLN
INTRAMUSCULAR | Status: AC
  Filled 2016-03-07: qty 30

## 2016-03-07 MED ORDER — SODIUM CHLORIDE 0.9 % WEIGHT BASED INFUSION: 3.0000 mL/kg/h | INTRAVENOUS | Status: AC

## 2016-03-07 MED ORDER — HEPARIN (PORCINE) IN NACL 2-0.9 UNIT/ML-% IJ SOLN
INTRAMUSCULAR | Status: DC | PRN
  Administered 2016-03-07: 1000 mL

## 2016-03-07 MED ORDER — ASPIRIN 81 MG PO CHEW
81.0000 mg | CHEWABLE_TABLET | ORAL | Status: AC
  Administered 2016-03-07: 81 mg via ORAL

## 2016-03-07 MED ORDER — METHYLPREDNISOLONE SODIUM SUCC 125 MG IJ SOLR
125.0000 mg | Freq: Once | INTRAMUSCULAR | Status: AC
  Administered 2016-03-07: 125 mg via INTRAVENOUS

## 2016-03-07 SURGICAL SUPPLY — 8 items
CATH INFINITI 5FR MULTPACK ANG (CATHETERS) ×1 IMPLANT
KIT HEART LEFT (KITS) ×2 IMPLANT
PACK CARDIAC CATHETERIZATION (CUSTOM PROCEDURE TRAY) ×2 IMPLANT
SHEATH PINNACLE 5F 10CM (SHEATH) ×1 IMPLANT
SYR MEDRAD MARK V 150ML (SYRINGE) ×2 IMPLANT
TRANSDUCER W/STOPCOCK (MISCELLANEOUS) ×2 IMPLANT
TUBING CIL FLEX 10 FLL-RA (TUBING) ×2 IMPLANT
WIRE EMERALD 3MM-J .035X150CM (WIRE) ×1 IMPLANT

## 2016-03-07 NOTE — Progress Notes (Signed)
Site area: rt groin fa sheath Site Prior to Removal:  Level  0 Pressure Applied For:  20 minutes Manual:   yes Patient Status During Pull:  stable Post Pull Site:  Level  0 Post Pull Instructions Given:  yes Post Pull Pulses Present:  yes Dressing Applied:  tegaderm Bedrest begins @  0930 Comments:

## 2016-03-07 NOTE — Interval H&P Note (Signed)
History and Physical Interval Note:  03/07/2016 8:31 AM  Angela [name]  has presented today for surgery, with the diagnosis of c/p abn stress  The various methods of treatment have been discussed with the patient and family. After consideration of risks, benefits and other options for treatment, the patient has consented to  Procedure(s): Left Heart Cath and Coronary Angiography (N/A) as a surgical intervention .  The patient's history has been reviewed, patient examined, no change in status, stable for surgery.  I have reviewed the patient's chart and labs.  Questions were answered to the patient's satisfaction.   Cath Lab Visit (complete for each Cath Lab visit)  Clinical Evaluation Leading to the Procedure:   ACS: No.  Non-ACS:    Anginal Classification: CCS II  Anti-ischemic medical therapy: Minimal Therapy (1 class of medications)  Non-Invasive Test Results: Intermediate-risk stress test findings: cardiac mortality 1-3%/year  Prior CABG: No previous CABG        Collier [facility] 03/07/2016 8:32 AM

## 2016-03-07 NOTE — H&P (View-Only) (Signed)
Cardiology Office Note:    Date:  03/05/2016   ID:  Angela [name], DOB July 02, 1936, MRN [id number]  PCP:  Irven Shelling, MD  Cardiologist:  Dr. Fransico Him  >> Patient requests FU with Dr. [name]  Electrophysiologist:  n/a  Chief Complaint  Patient presents with  . Chest Pain    Myoview Abnormal    History of Present Illness:     Angela [name] is a 80 y.o. female with a hx of CAD, s/p prior PCI to the LAD, HL, HTN, PVCs, rheumatoid arthritis, peptic ulcer disease, aortic stenosis.  She was seen for bradycardia and Holter in 8/15 showed NSR with PACs and PVCs and ATach.  Toprol was changed to Acebutolol.  Bradycardia resolved.  Last seen by Dr. Fransico Him in 4/16.  Prior echo suggested mod AS.  Repeat limited echo demonstrated just mild AS.   I saw her in 2/17 with increased swelling in her feet.  I felt her LE edema was multifactorial and related to venous insufficiency, inactivity, diastolic dysfunction.  I asked her to take extra Furosemide prn, wear compression stockings and to keep her legs elevated.  Admitted 3/17 with gastric outlet obstruction noted on EGD.  She had an NG tube placed and underwent EGD demonstrating a polypoid lesion in the pyloric channel with surrounding ulcer and edema.  Bx was neg for malignancy.  Hospital course was c/b AFlutter with RVR and she was seen by Cardiology.  Echo demonstrated mod AS, mod TR, EF 45%, LAE.  Acebutolol was increased to 400 bid.  She converted to NSR.   She also had episodes of PSVT. She was given prn Propranolol to use.  CHADS2-VASc=6 (CHF, vascular dz, female, age, HTN).  She was placed on Apixaban.    Last seen by me 01/30/16.  She was in NSR.  I decreased her Acebutolol due to low BP.    She walked in recently with complaints of CP.  Myoview was arranged.  This demonstrated inferolateral scar with peri-infarct ischemia.  It was felt to be intermediate risk and Dr. Fransico Him recommended that she undergo LHC to  evaluate.  She returns to discuss the procedure.    Since her Myoview, she has had no further chest pain.  She feels fatigued and dizzy at times.  Denies any significant change in her breathing.  Denies syncope, PND.  LE edema is chronic and stable.     Past Medical History  Diagnosis Date  . Cervical stenosis of spine   . Spondylolisthesis of lumbar region   . A-fib ([place])   . Asthma   . Depression   . Dyslipidemia   . GERD (gastroesophageal reflux disease)   . Obesity   . PVC's (premature ventricular contractions)   . PUD (peptic ulcer disease)     can not take aspirin  . Osteoporosis     alendronate since 2012  . Macular degeneration   . Aortic stenosis     mild by echo 03/2012  . Hypertension   . CAD (coronary artery disease)     s/p PCI with BMS to mid LAD--2/08 not on aspirin due to frequent ulcers.  Repeat cath for CP 09/2012 with patent LAD stent and otherwise normal coronary arteries  . Elbow fracture, right     history of- never any surgery  . Rheumatoid arthritis(714.0)     Dr. Lenna Gilford follows  . Impaired physical mobility     due to rheumatoid arthritis "ambulates with walker" limited  free standing.    Past Surgical History  Procedure Laterality Date  . Fracture surgery  09/2011    left ankle screw and pin removed  . Cardiac catheterization  09/2012    patent LAD stent and otherwise normal coronary arteries  . Cervical fusion      limited  ROM  . Tonsillectomy    . Cataract extraction, bilateral Bilateral   . Joint replacement Bilateral     BTKA  . Hand surgery Bilateral     x4 digits 2-5 both hands  . Ankle fusion Bilateral     x2 both  . Cholecystectomy    . Esophagogastroduodenoscopy (egd) with propofol N/A 11/01/2015    Procedure: ESOPHAGOGASTRODUODENOSCOPY (EGD) WITH PROPOFOL w/ Dialtation;  Surgeon: Garlan Fair, MD;  Location: [facility] ENDOSCOPY;  Service: Endoscopy;  Laterality: N/A;  . Esophagogastroduodenoscopy N/A 01/07/2016    Procedure:  ESOPHAGOGASTRODUODENOSCOPY (EGD);  Surgeon: Wilford Corner, MD;  Location: [facility];  Service: Endoscopy;  Laterality: N/A;    Current Medications: Outpatient Prescriptions Prior to Visit  Medication Sig Dispense Refill  . apixaban (ELIQUIS) 5 MG TABS tablet Take 1 tablet (5 mg total) by mouth 2 (two) times daily. 60 tablet 3  . butalbital-acetaminophen-caffeine (FIORICET, ESGIC) 50-325-40 MG per tablet Take 1 tablet by mouth every 6 (six) hours as needed for headache or migraine. For migraines    . Calcium Carbonate-Vitamin D (CALCIUM 500+D PO) Take 1 tablet by mouth 2 (two) times daily.     . DENTAGEL 1.1 % GEL dental gel Place 1 application onto teeth at bedtime.     . diclofenac sodium (VOLTAREN) 1 % GEL Apply 2 g topically 2 (two) times daily as needed. For pain (Patient taking differently: Apply 2 g topically 2 (two) times daily as needed (for pain). For pain) 3 Tube 5  . Docusate Calcium (STOOL SOFTENER PO) Take 1 tablet by mouth at bedtime.    Marland Kitchen EPIPEN 2-PAK 0.3 MG/0.3ML SOAJ Inject 0.3 mg into the muscle daily as needed (for allergic reaction).     . fluticasone (FLONASE) 50 MCG/ACT nasal spray Place 1 spray into both nostrils daily as needed for allergies.   0  . folic acid (FOLVITE) 1 MG tablet Take 1 mg by mouth daily.     . furosemide (LASIX) 40 MG tablet Take 1 tablet (40 mg total) by mouth daily. You may take extra 1/2 tablet on days when you have extra swelling (Patient taking differently: Take 40 mg by mouth daily. ) 45 tablet 6  . lidocaine (LIDODERM) 5 % APPLY 1 PATCH ONTO THE SKIN DAILY REMOVE AND DISCARD PATCH WITHIN 12 HOURS OR AS DIRECTED BY PRESCRIBER (Patient taking differently: Place 1 patch onto the skin every 12 (twelve) hours. APPLY NECK AND ELBOW) 90 patch 5  . LORazepam (ATIVAN) 2 MG tablet Take 2 mg by mouth at bedtime.  5  . methotrexate (RHEUMATREX) 2.5 MG tablet Take 15 mg by mouth every Saturday.     . nitroGLYCERIN (NITROSTAT) 0.4 MG SL tablet Place 1  tablet (0.4 mg total) under the tongue every 5 (five) minutes as needed for chest pain. 25 tablet 1  . nystatin cream (MYCOSTATIN) Apply 1 application topically daily as needed for dry skin.    Marland Kitchen oxyCODONE-acetaminophen (PERCOCET) 7.5-325 MG tablet Take 1 tablet by mouth every 6 (six) hours as needed. (Patient taking differently: Take 1 tablet by mouth every 6 (six) hours as needed for moderate pain. ) 120 tablet 0  . pantoprazole (PROTONIX)  40 MG tablet Take 40 mg by mouth 2 (two) times daily.     . predniSONE (DELTASONE) 5 MG tablet Take 5 mg by mouth daily with breakfast.     . PREVIDENT 0.2 % SOLN Take 1 each by mouth at bedtime. Rinses with about 1 capful nightly    . propranolol (INDERAL) 10 MG tablet Take 1 tablet (10 mg total) by mouth daily as needed (palpitations lasting more than 5 minutes). 30 tablet 0  . simvastatin (ZOCOR) 40 MG tablet TAKE 1 TABLET BY MOUTH AT BEDTIME 90 tablet 0  . TOVIAZ 8 MG TB24 tablet Take 8 mg by mouth daily.    Marland Kitchen acebutolol (SECTRAL) 200 MG capsule Take 1 capsule (200 mg total) by mouth as directed. 2 caps in AM and 1 cap in the PM 90 capsule 11   No facility-administered medications prior to visit.     Allergies:   Peanut-containing drug products; Reglan; Clindamycin/lincomycin; Codeine; Iohexol; Pineapple; and Shellfish allergy   Social History   Social History  . Marital Status: Widowed    Spouse Name: N/A  . Number of Children: N/A  . Years of Education: N/A   Social History Main Topics  . Smoking status: Never Smoker   . Smokeless tobacco: Never Used  . Alcohol Use: No  . Drug Use: No  . Sexual Activity: Not Asked   Other Topics Concern  . None   Social History Narrative     Family History:  The patient's family history includes Arrhythmia in her mother; Lung cancer in her mother; Melanoma in her brother. There is no history of Heart attack.   ROS:   Please see the history of present illness.    Review of Systems  Constitution:  Positive for decreased appetite, malaise/fatigue and weight loss.  Cardiovascular: Positive for irregular heartbeat and leg swelling.  Hematologic/Lymphatic: Bruises/bleeds easily.  Musculoskeletal: Positive for back pain, joint pain, joint swelling and myalgias.  Gastrointestinal: Positive for constipation and diarrhea.  Neurological: Positive for dizziness and loss of balance.  Psychiatric/Behavioral: The patient is nervous/anxious.   All other systems reviewed and are negative.   Physical Exam:    VS:  BP 100/50 mmHg  Pulse 64  Ht 5\' 2"  (1.575 m)  Wt 112 lb 1.9 oz (50.857 kg)  BMI 20.50 kg/m2   GEN: Well nourished, well developed, in no acute distress HEENT: normal Neck: no JVD, no masses Cardiac: Normal S1/S2, irreg rhythm; 1/6 systolic murmur RUSB, trace- 1+ bilateral LE edema   Respiratory:  clear to auscultation bilaterally; no   rales GI: soft, nontender  MS: bilat hands with rheumatoid deformities (ulnar deviation, swan neck) Skin: warm and dry   Neuro:  no focal deficits  Psych: Alert and oriented x 3, normal affect  Wt Readings from Last 3 Encounters:  03/05/16 112 lb 1.9 oz (50.857 kg)  02/29/16 117 lb (53.071 kg)  01/30/16 117 lb (53.071 kg)      Studies/Labs Reviewed:     EKG:  EKG is  Not ordered today.  The ekg ordered today demonstrates n/a   Recent Labs: 01/12/2016: ALT 28; Magnesium 2.0 01/13/2016: TSH 1.427 02/27/2016: BUN 10; Creat 0.56*; Potassium 4.1; Sodium 135 03/05/2016: Hemoglobin 13.5; Platelets 185   Recent Lipid Panel    Component Value Date/Time   CHOL 129 02/24/2015 1030   TRIG 81 01/09/2016 0515   HDL 58.80 02/24/2015 1030   CHOLHDL 2 02/24/2015 1030   VLDL 17.6 02/24/2015 1030   LDLCALC 53  02/24/2015 1030    Additional studies/ records that were reviewed today include:   Myoview 02/29/16 EF 45%, inf AK, inferolateral scar with peri-infarct ischemia, intermediate to high risk study  Echo 01/14/16 EF 45%, diff HK, mod AS, mean 16  mmHg, mild MR, mod to severe LAE, mod TR, PASP 34 mmHg  Limited Echo 03/09/15 Mild AS, mean 13 mmHg, mod LAE  Echo 02/24/15 EF 55-60%, inf HK, Gr 2 DD, probable mod AS, mean 18 mmHg, MAC, mild MR, mild LAE, mild RAE, mild to mod TR, PASP 39 mmHg  Holter 8/15 NSR,PVCs, PACs, WCT (NSVT vs aberration)  LHC (11/13):  Proximal LAD stent patent, normal left main, circumflex and RCA, EF 50-55% with inferobasal akinesis, LVEDP 17 mm Hg  Echo (4/15):  Mild LVH, EF 60-65%, normal wall motion, grade 1 diastolic dysfunction, moderate aortic stenosis (mean 17 mm Hg), trivial AI, borderline dilated ascending aorta, severe LAE, mild RAE, mild TR, PASP 35 mm Hg  Nuclear (5/13):  Breast attenuation, no ischemia, EF 55%-low risk   ASSESSMENT:     1. Paroxysmal atrial fibrillation (HCC)   2. SVT (supraventricular tachycardia) ([place])   3. Cardiomyopathy ([place])   4. Coronary artery disease involving native coronary artery of native heart with angina pectoris ([place])   5. Aortic stenosis   6. Essential hypertension, benign   7. Bilateral edema of lower extremity     PLAN:     In order of problems listed above:  1. PAF/Flutter -  In NSR at [facility].  CHADS2-VASc=6.  Continue long term anticoagulation with Apixaban.  She will hold her Eliquis x 2 days prior to cath.  No hx of CVA.    2. SVT - No apparent recurrence.   3. Cardiomyopathy - EF lower on recent echo than prior studies.  Suspect this is from RVR.  Consider repeat Echo in 2-3 mos - depending upon results of cath.  4. CAD - She is now off Plavix as she is on Apixaban.  Continue statin.    Recent Myoview intermediate risk with inferolateral scar with peri-infarct ischemia. Dr. Radford Pax has recommended proceeding with cardiac cath.  Risks and benefits of cardiac catheterization have been discussed with the patient.  These include bleeding, infection, kidney damage, stroke, heart attack, death.  The patient understands these risks and is willing to  proceed.  Arrange LHC later this week.  Pre cath labs to be checked today.  Keep FU with Dr. Acie Fredrickson as planned post cath.  5. Aortic stenosis - Mod by recent echo.  Reviewed by Dr. Radford Pax and felt to be mild.  Gradients are fairly stable.     6. HTN - Controlled.   7. Edema - Multifactorial. Continue Lasix 40 QD. She can take an extra 20 mg prn.    Medication Adjustments/Labs and Tests Ordered: Current medicines are reviewed at length with the patient today.  Concerns regarding medicines are outlined above.  Medication changes, Labs and Tests ordered today are outlined in the Patient Instructions noted below. Patient Instructions  Medication Instructions:  Your physician recommends that you continue on your current medications as directed. Please refer to the Current Medication list given to you today. If you need a refill on your cardiac medications before your next appointment, please call your pharmacy. Labwork: CBC BMET AND PTINR  Testing/Procedures:  SEE LETTER FOR LEFT HEART CATH 03/07/16.. Follow-Up:   ALREADY  SCHEDULED  Any Other Special Instructions Will Be Listed Below (If Applicable).   Signed, Richardson Dopp,  PA-C  03/05/2016 3:46 PM    [facility] [address], [place], [place]  [address] Phone: [phone]; Fax: [phone]

## 2016-03-07 NOTE — Discharge Instructions (Addendum)
May resume Eliquis tomorrow am 5 mg twice a dayAngiogram, Care After Refer to this sheet in the next few weeks. These instructions provide you with information about caring for yourself after your procedure. Your health care provider may also give you more specific instructions. Your treatment has been planned according to current medical practices, but problems sometimes occur. Call your health care provider if you have any problems or questions after your procedure. WHAT TO EXPECT AFTER THE PROCEDURE After your procedure, it is typical to have the following:  Bruising at the catheter insertion site that usually fades within 1-2 weeks.  Blood collecting in the tissue (hematoma) that may be painful to the touch. It should usually decrease in size and tenderness within 1-2 weeks. HOME CARE INSTRUCTIONS  Take medicines only as directed by your health care provider.  You may shower 24-48 hours after the procedure or as directed by your health care provider. Remove the bandage (dressing) and gently wash the site with plain soap and water. Pat the area dry with a clean towel. Do not rub the site, because this may cause bleeding.  Do not take baths, swim, or use a hot tub until your health care provider approves.  Check your insertion site every day for redness, swelling, or drainage.  Do not apply powder or lotion to the site.  Do not lift over 10 lb (4.5 kg) for 5 days after your procedure or as directed by your health care provider.  Ask your health care provider when it is okay to:  Return to work or school.  Resume usual physical activities or sports.  Resume sexual activity.  Do not drive home if you are discharged the same day as the procedure. Have someone else drive you.  You may drive 24 hours after the procedure unless otherwise instructed by your health care provider.  Do not operate machinery or power tools for 24 hours after the procedure or as directed by your health care  provider.  If your procedure was done as an outpatient procedure, which means that you went home the same day as your procedure, a responsible adult should be with you for the first 24 hours after you arrive home.  Keep all follow-up visits as directed by your health care provider. This is important. SEEK MEDICAL CARE IF:  You have a fever.  You have chills.  You have increased bleeding from the catheter insertion site. Hold pressure on the site. CALL 911 SEEK IMMEDIATE MEDICAL CARE IF:  You have unusual pain at the catheter insertion site.  You have redness, warmth, or swelling at the catheter insertion site.  You have drainage (other than a small amount of blood on the dressing) from the catheter insertion site.  The catheter insertion site is bleeding, and the bleeding does not stop after 30 minutes of holding steady pressure on the site.  The area near or just beyond the catheter insertion site becomes pale, cool, tingly, or numb.   This information is not intended to replace advice given to you by your health care provider. Make sure you discuss any questions you have with your health care provider.   Document Released: 05/10/2005 Document Revised: 11/12/2014 Document Reviewed: 03/25/2013 Elsevier Interactive Patient Education [employer].

## 2016-03-15 DIAGNOSIS — I2510 Atherosclerotic heart disease of native coronary artery without angina pectoris: Secondary | ICD-10-CM | POA: Diagnosis not present

## 2016-03-15 DIAGNOSIS — K311 Adult hypertrophic pyloric stenosis: Secondary | ICD-10-CM | POA: Diagnosis not present

## 2016-03-15 DIAGNOSIS — K279 Peptic ulcer, site unspecified, unspecified as acute or chronic, without hemorrhage or perforation: Secondary | ICD-10-CM | POA: Diagnosis not present

## 2016-03-15 DIAGNOSIS — M069 Rheumatoid arthritis, unspecified: Secondary | ICD-10-CM | POA: Diagnosis not present

## 2016-03-15 DIAGNOSIS — K2980 Duodenitis without bleeding: Secondary | ICD-10-CM | POA: Diagnosis not present

## 2016-03-15 DIAGNOSIS — K3184 Gastroparesis: Secondary | ICD-10-CM | POA: Diagnosis not present

## 2016-03-20 ENCOUNTER — Ambulatory Visit: Payer: Medicare Other | Admitting: Cardiology

## 2016-03-20 ENCOUNTER — Other Ambulatory Visit: Payer: Self-pay | Admitting: Cardiology

## 2016-03-21 ENCOUNTER — Encounter: Payer: Self-pay | Admitting: Cardiovascular Disease

## 2016-03-21 ENCOUNTER — Ambulatory Visit (INDEPENDENT_AMBULATORY_CARE_PROVIDER_SITE_OTHER): Payer: Medicare Other | Admitting: Cardiovascular Disease

## 2016-03-21 VITALS — BP 108/70 | HR 78 | Ht 62.0 in | Wt 113.0 lb

## 2016-03-21 DIAGNOSIS — I25119 Atherosclerotic heart disease of native coronary artery with unspecified angina pectoris: Secondary | ICD-10-CM

## 2016-03-21 DIAGNOSIS — I350 Nonrheumatic aortic (valve) stenosis: Secondary | ICD-10-CM | POA: Diagnosis not present

## 2016-03-21 DIAGNOSIS — I480 Paroxysmal atrial fibrillation: Secondary | ICD-10-CM

## 2016-03-21 NOTE — Patient Instructions (Signed)
Medication Instructions:  Your physician recommends that you continue on your current medications as directed. Please refer to the Current Medication list given to you today.   Labwork: None Ordered   Testing/Procedures: None Ordered   Follow-Up: Your physician recommends that you schedule a follow-up appointment in: 3 months with Dr. Nahser   If you need a refill on your cardiac medications before your next appointment, please call your pharmacy.   Thank you for choosing [facility]! Arrow Emmerich, RN [phone]    

## 2016-03-21 NOTE — Progress Notes (Signed)
Cardiology Office Note:    Date:  03/21/2016   ID:  Angela [name], DOB 04/03/1936, MRN [id number]  PCP:  Irven Shelling, MD  Cardiologist:  Dr. Fransico Him  >> Patient requests FU with Dr. [name]  Electrophysiologist:  n/a  Chief Complaint  Patient presents with  . Follow-up    PAF   Problem List 1. Paroxysmal atrial fib 2. Atrial fib  3. Rheumatiod arthritis  4. Hx of gastric outlet obstruction     History of Present Illness:     Angela [name] is a 80 y.o. female with a hx of CAD, s/p prior PCI to the LAD, HL, HTN, PVCs, rheumatoid arthritis, peptic ulcer disease, aortic stenosis.  She was seen for bradycardia and Holter in 8/15 showed NSR with PACs and PVCs and ATach.  Toprol was changed to Acebutolol.  Bradycardia resolved.  Last seen by Dr. Fransico Him in 4/16.  Prior echo suggested mod AS.  Repeat limited echo demonstrated just mild AS.   I saw her in 2/17 with increased swelling in her feet.  I felt her LE edema was multifactorial and related to venous insufficiency, inactivity, diastolic dysfunction.  I asked her to take extra Furosemide prn, wear compression stockings and to keep her legs elevated.  Admitted 3/17 with gastric outlet obstruction noted on EGD.  She had an NG tube placed and underwent EGD demonstrating a polypoid lesion in the pyloric channel with surrounding ulcer and edema.  Bx was neg for malignancy.  Hospital course was c/b AFlutter with RVR and she was seen by Cardiology.  Echo demonstrated mod AS, mod TR, EF 45%, LAE.  Acebutolol was increased to 400 bid.  She converted to NSR.   She also had episodes of PSVT. She was given prn Propranolol to use.  CHADS2-VASc=6 (CHF, vascular dz, female, age, HTN).  She was placed on Apixaban.    Last seen by me 01/30/16.  She was in NSR.  I decreased her Acebutolol due to low BP.      Since her Myoview, she has had no further chest pain.  She feels fatigued and dizzy at times.  Denies any significant  change in her breathing.  Denies syncope, PND.  LE edema is chronic and stable.    Mar 21, 2016:  Angela [name] is seen today .   I saw her in the hospital for an episode of PAF that occurred during an admission for small bowel obstruction . Has had some dizziness.    Not eating well Has lost some weight - has lost 30-40 lbs over the past year.   Her acebutolol dose is 400 mg in the AM, 200 mg in the PM    Past Medical History  Diagnosis Date  . Cervical stenosis of spine   . Spondylolisthesis of lumbar region   . A-fib ([place])   . Asthma   . Depression   . Dyslipidemia   . GERD (gastroesophageal reflux disease)   . Obesity   . PVC's (premature ventricular contractions)   . PUD (peptic ulcer disease)     can not take aspirin  . Osteoporosis     alendronate since 2012  . Macular degeneration   . Aortic stenosis     mild by echo 03/2012  . Hypertension   . CAD (coronary artery disease)     s/p PCI with BMS to mid LAD--2/08 not on aspirin due to frequent ulcers.  Repeat cath for CP 09/2012 with patent LAD stent  and otherwise normal coronary arteries  . Elbow fracture, right     history of- never any surgery  . Rheumatoid arthritis(714.0)     Dr. Lenna Gilford follows  . Impaired physical mobility     due to rheumatoid arthritis "ambulates with walker" limited free standing.    Past Surgical History  Procedure Laterality Date  . Fracture surgery  09/2011    left ankle screw and pin removed  . Cardiac catheterization  09/2012    patent LAD stent and otherwise normal coronary arteries  . Cervical fusion      limited  ROM  . Tonsillectomy    . Cataract extraction, bilateral Bilateral   . Joint replacement Bilateral     BTKA  . Hand surgery Bilateral     x4 digits 2-5 both hands  . Ankle fusion Bilateral     x2 both  . Cholecystectomy    . Esophagogastroduodenoscopy (egd) with propofol N/A 11/01/2015    Procedure: ESOPHAGOGASTRODUODENOSCOPY (EGD) WITH PROPOFOL w/ Dialtation;  Surgeon:  Garlan Fair, MD;  Location: [facility];  Service: Endoscopy;  Laterality: N/A;  . Esophagogastroduodenoscopy N/A 01/07/2016    Procedure: ESOPHAGOGASTRODUODENOSCOPY (EGD);  Surgeon: Wilford Corner, MD;  Location: [facility];  Service: Endoscopy;  Laterality: N/A;  . Cardiac catheterization N/A 03/07/2016    Procedure: Left Heart Cath and Coronary Angiography;  Surgeon: Peter M Martinique, MD;  Location: [facility];  Service: Cardiovascular;  Laterality: N/A;    Current Medications: Outpatient Prescriptions Prior to Visit  Medication Sig Dispense Refill  . acebutolol (SECTRAL) 200 MG capsule TAKE 200 MG 1 CAPSULE BY MOUTH ONCE IN THE EVENING AT BED TIME    . acebutolol (SECTRAL) 400 MG capsule TAKE 1 CAPSULE (400 MG TOTAL) BY MOUTH DAILY IN THE MORNING  3  . butalbital-acetaminophen-caffeine (FIORICET, ESGIC) 50-325-40 MG per tablet Take 1 tablet by mouth every 6 (six) hours as needed for headache or migraine. For migraines    . Calcium Carbonate-Vitamin D (CALCIUM 500+D PO) Take 1 tablet by mouth 2 (two) times daily.     . DENTAGEL 1.1 % GEL dental gel Place 1 application onto teeth at bedtime.     . diclofenac sodium (VOLTAREN) 1 % GEL Apply 2 g topically 2 (two) times daily as needed. For pain (Patient taking differently: Apply 2 g topically 2 (two) times daily as needed (for pain). For pain) 3 Tube 5  . Docusate Calcium (STOOL SOFTENER PO) Take 1 tablet by mouth at bedtime.    Marland Kitchen EPIPEN 2-PAK 0.3 MG/0.3ML SOAJ Inject 0.3 mg into the muscle daily as needed (for allergic reaction).     . fluticasone (FLONASE) 50 MCG/ACT nasal spray Place 1 spray into both nostrils daily as needed for allergies.   0  . folic acid (FOLVITE) 1 MG tablet Take 1 mg by mouth daily.     . furosemide (LASIX) 40 MG tablet Take 1 tablet (40 mg total) by mouth daily. You may take extra 1/2 tablet on days when you have extra swelling (Patient taking differently: Take 40 mg by mouth daily. ) 45 tablet 6  .  lidocaine (LIDODERM) 5 % APPLY 1 PATCH ONTO THE SKIN DAILY REMOVE AND DISCARD PATCH WITHIN 12 HOURS OR AS DIRECTED BY PRESCRIBER (Patient taking differently: Place 1 patch onto the skin every 12 (twelve) hours. APPLY NECK AND ELBOW) 90 patch 5  . LORazepam (ATIVAN) 2 MG tablet Take 2 mg by mouth at bedtime.  5  . methotrexate ([place])  2.5 MG tablet Take 15 mg by mouth every Saturday.     . nitroGLYCERIN (NITROSTAT) 0.4 MG SL tablet Place 1 tablet (0.4 mg total) under the tongue every 5 (five) minutes as needed for chest pain. 25 tablet 1  . nystatin cream (MYCOSTATIN) Apply 1 application topically daily as needed for dry skin.    Marland Kitchen oxyCODONE-acetaminophen (PERCOCET) 7.5-325 MG tablet Take 1 tablet by mouth every 6 (six) hours as needed. (Patient taking differently: Take 1 tablet by mouth every 6 (six) hours as needed for moderate pain. ) 120 tablet 0  . pantoprazole (PROTONIX) 40 MG tablet Take 40 mg by mouth 2 (two) times daily.     . predniSONE (DELTASONE) 5 MG tablet Take 5 mg by mouth daily with breakfast.     . PREVIDENT 0.2 % SOLN Take 1 each by mouth at bedtime. Rinses with about 1 capful nightly    . propranolol (INDERAL) 10 MG tablet Take 1 tablet (10 mg total) by mouth daily as needed (palpitations lasting more than 5 minutes). 30 tablet 0  . simvastatin (ZOCOR) 40 MG tablet TAKE 1 TABLET BY MOUTH AT BEDTIME 90 tablet 3  . TOVIAZ 8 MG TB24 tablet Take 8 mg by mouth daily.     No facility-administered medications prior to visit.     Allergies:   Peanut-containing drug products; Reglan; Clindamycin/lincomycin; Codeine; Iohexol; Pineapple; and Shellfish allergy   Social History   Social History  . Marital Status: Widowed    Spouse Name: N/A  . Number of Children: N/A  . Years of Education: N/A   Social History Main Topics  . Smoking status: Never Smoker   . Smokeless tobacco: Never Used  . Alcohol Use: No  . Drug Use: No  . Sexual Activity: Not Asked   Other Topics Concern   . None   Social History Narrative     Family History:  The patient's family history includes Arrhythmia in her mother; Lung cancer in her mother; Melanoma in her brother. There is no history of Heart attack.   ROS:   Please see the history of present illness.    Review of Systems  Constitution: Positive for decreased appetite, malaise/fatigue and weight loss.  Cardiovascular: Positive for irregular heartbeat and leg swelling.  Hematologic/Lymphatic: Bruises/bleeds easily.  Musculoskeletal: Positive for back pain, joint pain, joint swelling and myalgias.  Gastrointestinal: Positive for constipation and diarrhea.  Neurological: Positive for dizziness and loss of balance.  Psychiatric/Behavioral: The patient is nervous/anxious.   All other systems reviewed and are negative.   Physical Exam:    VS:  BP 108/70 mmHg  Pulse 78  Ht 5\' 2"  (1.575 m)  Wt 113 lb (51.256 kg)  BMI 20.66 kg/m2   GEN: Well nourished, well developed, in no acute distress HEENT: normal Neck: no JVD, no masses Cardiac: Irreg. Irreg.  Normal 99991111, ; 1/6 systolic murmur RUSB, trace- 1+ bilateral LE edema   Respiratory:  clear to auscultation bilaterally; no   rales GI: soft, nontender  MS: bilat hands with rheumatoid deformities (ulnar deviation, swan neck) Skin: warm and dry   Neuro:  no focal deficits  Psych: Alert and oriented x 3, normal affect  Wt Readings from Last 3 Encounters:  03/21/16 113 lb (51.256 kg)  03/07/16 109 lb (49.442 kg)  03/05/16 112 lb 1.9 oz (50.857 kg)      Studies/Labs Reviewed:     EKG:  EKG is  Not ordered today.  The ekg ordered today demonstrates  n/a   Recent Labs: 01/12/2016: ALT 28; Magnesium 2.0 01/13/2016: TSH 1.427 03/05/2016: BUN 12; Creat 0.64; Hemoglobin 13.5; Platelets 185; Potassium 3.8; Sodium 136   Recent Lipid Panel    Component Value Date/Time   CHOL 129 02/24/2015 1030   TRIG 81 01/09/2016 0515   HDL 58.80 02/24/2015 1030   CHOLHDL 2 02/24/2015 1030     VLDL 17.6 02/24/2015 1030   LDLCALC 53 02/24/2015 1030    Additional studies/ records that were reviewed today include:   Myoview 02/29/16 EF 45%, inf AK, inferolateral scar with peri-infarct ischemia, intermediate to high risk study  Echo 01/14/16 EF 45%, diff HK, mod AS, mean 16 mmHg, mild MR, mod to severe LAE, mod TR, PASP 34 mmHg  Limited Echo 03/09/15 Mild AS, mean 13 mmHg, mod LAE  Echo 02/24/15 EF 55-60%, inf HK, Gr 2 DD, probable mod AS, mean 18 mmHg, MAC, mild MR, mild LAE, mild RAE, mild to mod TR, PASP 39 mmHg  Holter 8/15 NSR,PVCs, PACs, WCT (NSVT vs aberration)  LHC (11/13):  Proximal LAD stent patent, normal left main, circumflex and RCA, EF 50-55% with inferobasal akinesis, LVEDP 17 mm Hg  Echo (4/15):  Mild LVH, EF 60-65%, normal wall motion, grade 1 diastolic dysfunction, moderate aortic stenosis (mean 17 mm Hg), trivial AI, borderline dilated ascending aorta, severe LAE, mild RAE, mild TR, PASP 35 mm Hg  Nuclear (5/13):  Breast attenuation, no ischemia, EF 55%-low risk   ASSESSMENT:     No diagnosis found.  PLAN:     In order of problems listed above:  1. PAF/Flutter -  In NSR at [facility].  CHADS2-VASc=6.  Continue long term anticoagulation with Apixaban.   2. SVT - No apparent recurrence.   3. Cardiomyopathy - EF lower on recent echo than prior studies.  Suspect this is from RVR.  Consider repeat Echo in 2-3 mos -   4. CAD - She is now off Plavix as she is on Apixaban.  Continue statin.    Stress myoview was abnormal  But her stent was patent and she had no minimal irreg.   5. Aortic stenosis - Mod by recent echo.      6. HTN - Controlled.   7. Edema - Multifactorial. Continue Lasix 40 QD. She can take an extra 20 mg prn.    Medication Adjustments/Labs and Tests Ordered: Current medicines are reviewed at length with the patient today.  Concerns regarding medicines are outlined above.  Medication changes, Labs and Tests ordered today are  outlined in the Patient Instructions noted below.   Mertie Moores, MD  03/21/2016 4:38 PM    [place] [facility],  [place],   [address] Pager [phone] Phone: [phone]; Fax: [phone]

## 2016-04-03 ENCOUNTER — Encounter: Payer: Self-pay | Admitting: Physical Medicine & Rehabilitation

## 2016-04-03 ENCOUNTER — Encounter: Payer: Medicare Other | Attending: Physical Medicine & Rehabilitation | Admitting: Physical Medicine & Rehabilitation

## 2016-04-03 VITALS — BP 112/65 | HR 54 | Resp 14

## 2016-04-03 DIAGNOSIS — M069 Rheumatoid arthritis, unspecified: Secondary | ICD-10-CM | POA: Diagnosis not present

## 2016-04-03 DIAGNOSIS — Z9841 Cataract extraction status, right eye: Secondary | ICD-10-CM | POA: Insufficient documentation

## 2016-04-03 DIAGNOSIS — Z79899 Other long term (current) drug therapy: Secondary | ICD-10-CM | POA: Insufficient documentation

## 2016-04-03 DIAGNOSIS — M4802 Spinal stenosis, cervical region: Secondary | ICD-10-CM | POA: Diagnosis not present

## 2016-04-03 DIAGNOSIS — Z9842 Cataract extraction status, left eye: Secondary | ICD-10-CM | POA: Insufficient documentation

## 2016-04-03 DIAGNOSIS — M05712 Rheumatoid arthritis with rheumatoid factor of left shoulder without organ or systems involvement: Secondary | ICD-10-CM | POA: Diagnosis not present

## 2016-04-03 DIAGNOSIS — R269 Unspecified abnormalities of gait and mobility: Secondary | ICD-10-CM | POA: Insufficient documentation

## 2016-04-03 DIAGNOSIS — M4316 Spondylolisthesis, lumbar region: Secondary | ICD-10-CM | POA: Insufficient documentation

## 2016-04-03 DIAGNOSIS — I493 Ventricular premature depolarization: Secondary | ICD-10-CM | POA: Diagnosis not present

## 2016-04-03 DIAGNOSIS — E669 Obesity, unspecified: Secondary | ICD-10-CM | POA: Insufficient documentation

## 2016-04-03 DIAGNOSIS — I25119 Atherosclerotic heart disease of native coronary artery with unspecified angina pectoris: Secondary | ICD-10-CM | POA: Diagnosis not present

## 2016-04-03 DIAGNOSIS — M4712 Other spondylosis with myelopathy, cervical region: Secondary | ICD-10-CM | POA: Diagnosis not present

## 2016-04-03 DIAGNOSIS — H3530 Unspecified macular degeneration: Secondary | ICD-10-CM | POA: Insufficient documentation

## 2016-04-03 DIAGNOSIS — M7989 Other specified soft tissue disorders: Secondary | ICD-10-CM | POA: Diagnosis not present

## 2016-04-03 DIAGNOSIS — M05711 Rheumatoid arthritis with rheumatoid factor of right shoulder without organ or systems involvement: Secondary | ICD-10-CM | POA: Insufficient documentation

## 2016-04-03 DIAGNOSIS — Z981 Arthrodesis status: Secondary | ICD-10-CM | POA: Diagnosis not present

## 2016-04-03 DIAGNOSIS — I2510 Atherosclerotic heart disease of native coronary artery without angina pectoris: Secondary | ICD-10-CM | POA: Diagnosis not present

## 2016-04-03 DIAGNOSIS — M810 Age-related osteoporosis without current pathological fracture: Secondary | ICD-10-CM | POA: Insufficient documentation

## 2016-04-03 DIAGNOSIS — F329 Major depressive disorder, single episode, unspecified: Secondary | ICD-10-CM | POA: Diagnosis not present

## 2016-04-03 DIAGNOSIS — I4891 Unspecified atrial fibrillation: Secondary | ICD-10-CM | POA: Insufficient documentation

## 2016-04-03 DIAGNOSIS — Z955 Presence of coronary angioplasty implant and graft: Secondary | ICD-10-CM | POA: Insufficient documentation

## 2016-04-03 DIAGNOSIS — J45909 Unspecified asthma, uncomplicated: Secondary | ICD-10-CM | POA: Diagnosis not present

## 2016-04-03 DIAGNOSIS — E785 Hyperlipidemia, unspecified: Secondary | ICD-10-CM | POA: Insufficient documentation

## 2016-04-03 DIAGNOSIS — K219 Gastro-esophageal reflux disease without esophagitis: Secondary | ICD-10-CM | POA: Insufficient documentation

## 2016-04-03 DIAGNOSIS — I10 Essential (primary) hypertension: Secondary | ICD-10-CM | POA: Diagnosis not present

## 2016-04-03 DIAGNOSIS — R1310 Dysphagia, unspecified: Secondary | ICD-10-CM | POA: Insufficient documentation

## 2016-04-03 DIAGNOSIS — I350 Nonrheumatic aortic (valve) stenosis: Secondary | ICD-10-CM | POA: Insufficient documentation

## 2016-04-03 DIAGNOSIS — Z8711 Personal history of peptic ulcer disease: Secondary | ICD-10-CM | POA: Insufficient documentation

## 2016-04-03 DIAGNOSIS — Z9049 Acquired absence of other specified parts of digestive tract: Secondary | ICD-10-CM | POA: Insufficient documentation

## 2016-04-03 MED ORDER — OXYCODONE-ACETAMINOPHEN 7.5-325 MG PO TABS: 1.0000 | ORAL_TABLET | Freq: Four times a day (QID) | ORAL | Status: DC | PRN

## 2016-04-03 NOTE — Patient Instructions (Signed)
  PLEASE CALL ME WITH ANY PROBLEMS OR QUESTIONS (#[phone]).      

## 2016-04-03 NOTE — Progress Notes (Signed)
Subjective:    Patient ID: Angela [name], female    DOB: Jan 30, 1936, 80 y.o.   MRN: [id number]  HPI    Angela [name] is here in follow up of her chronic pain. Since i last saw her, she lost her daughter and was in the hospital for an pyloric obstruction. Obviously it has been a tough spring for her, but she is getting through things the best she can. She is using her walker at home and has an aid during the day to helip with bathing/dressing. She's alone at night.   She continues on percocet for pain control and utilizes her topical treatments including the voltaren, lidoderm, and heat.        Pain Inventory Average Pain 4 Pain Right Now 4 My pain is dull and aching  In the last 24 hours, has pain interfered with the following? General activity 7 Relation with others 7 Enjoyment of life 9 What TIME of day is your pain at its worst? morning, night Sleep (in general) Fair  Pain is worse with: unsure Pain improves with: heat/ice, pacing activities, medication and injections Relief from Meds: 8  Mobility walk with assistance use a walker how many minutes can you walk? 5 ability to climb steps?  no do you drive?  no use a wheelchair transfers alone Do you have any goals in this area?  yes  Function retired I need assistance with the following:  dressing, bathing, meal prep, household duties and shopping Do you have any goals in this area?  yes  Neuro/Psych bladder control problems bowel control problems weakness numbness tingling dizziness confusion depression  Prior Studies x-rays CT/MRI  Physicians involved in your care Any changes since last visit?  no   Family History  Problem Relation Age of Onset  . Lung cancer Mother   . Melanoma Brother   . Arrhythmia Mother   . Heart attack Neg Hx    Social History   Social History  . Marital Status: Widowed    Spouse Name: N/A  . Number of Children: N/A  . Years of Education: N/A   Social History Main  Topics  . Smoking status: Never Smoker   . Smokeless tobacco: Never Used  . Alcohol Use: No  . Drug Use: No  . Sexual Activity: Not Asked   Other Topics Concern  . None   Social History Narrative   Past Surgical History  Procedure Laterality Date  . Fracture surgery  09/2011    left ankle screw and pin removed  . Cardiac catheterization  09/2012    patent LAD stent and otherwise normal coronary arteries  . Cervical fusion      limited  ROM  . Tonsillectomy    . Cataract extraction, bilateral Bilateral   . Joint replacement Bilateral     BTKA  . Hand surgery Bilateral     x4 digits 2-5 both hands  . Ankle fusion Bilateral     x2 both  . Cholecystectomy    . Esophagogastroduodenoscopy (egd) with propofol N/A 11/01/2015    Procedure: ESOPHAGOGASTRODUODENOSCOPY (EGD) WITH PROPOFOL w/ Dialtation;  Surgeon: Garlan Fair, MD;  Location: [facility];  Service: Endoscopy;  Laterality: N/A;  . Esophagogastroduodenoscopy N/A 01/07/2016    Procedure: ESOPHAGOGASTRODUODENOSCOPY (EGD);  Surgeon: Wilford Corner, MD;  Location: [facility];  Service: Endoscopy;  Laterality: N/A;  . Cardiac catheterization N/A 03/07/2016    Procedure: Left Heart Cath and Coronary Angiography;  Surgeon: Peter M Martinique, MD;  Location: [facility]  INVASIVE CV LAB;  Service: Cardiovascular;  Laterality: N/A;   Past Medical History  Diagnosis Date  . Cervical stenosis of spine   . Spondylolisthesis of lumbar region   . A-fib ([place])   . Asthma   . Depression   . Dyslipidemia   . GERD (gastroesophageal reflux disease)   . Obesity   . PVC's (premature ventricular contractions)   . PUD (peptic ulcer disease)     can not take aspirin  . Osteoporosis     alendronate since 2012  . Macular degeneration   . Aortic stenosis     mild by echo 03/2012  . Hypertension   . CAD (coronary artery disease)     s/p PCI with BMS to mid LAD--2/08 not on aspirin due to frequent ulcers.  Repeat cath for CP 09/2012 with patent LAD  stent and otherwise normal coronary arteries  . Elbow fracture, right     history of- never any surgery  . Rheumatoid arthritis(714.0)     Dr. Lenna Gilford follows  . Impaired physical mobility     due to rheumatoid arthritis "ambulates with walker" limited free standing.   BP 112/65 mmHg  Pulse 54  Resp 14  SpO2 98%  Opioid Risk Score:   Fall Risk Score:  `1  Depression screen PHQ 2/9  Depression screen PHQ 2/9 02/14/2015  Decreased Interest 2  Down, Depressed, Hopeless 2  PHQ - 2 Score 4  Altered sleeping 2  Tired, decreased energy 3  Change in appetite 2  Feeling bad or failure about yourself  2  Trouble concentrating 2  Moving slowly or fidgety/restless 1  Suicidal thoughts 0  PHQ-9 Score 16     Review of Systems  Constitutional: Positive for appetite change and unexpected weight change.  Cardiovascular: Positive for leg swelling.  Gastrointestinal: Positive for diarrhea and constipation.  Genitourinary: Positive for difficulty urinating. Negative for vaginal pain.  Skin: Positive for rash.       Objective:   Physical Exam  She does seem to have lost some weight..  She ambulated with the rolling walker. She takes longer steps and is stable. Changed directions well. She can extend elbow to -30 degrees. Her cervical ROM appears at baseline.  Neurological: She has normal reflexes. No cranial nerve deficit or sensory deficit.  Motor function appears at baseline. Balance improved  Skin: vasculitic lesions, petechiae over distal lower exts from the knee down.  M/S: low back tender to palpation. Needs extra time to stand. Posture is head forward, kyphotic. Right elbow with hematoma and skin tear Psychiatric: She has a normal mood and affect. Her behavior is normal. Judgment and thought content normal.    Assessment & Plan:   ASSESSMENT:  1. History of cervical stenosis/spondylosis with myelopathy.  2. Rheumatoid arthritis.  3. Lumbar spondylolisthesis.  4. Gait  disorder related to the above, recent fall with renal failure.  5. dysphagia    PLAN:  1. She was given one prescription for Percocet 7.5/325 1 p.o. q.6  hours p.r.n., 120.   2. Continue with lidoderm and voltaren gel. Consider custom compounded cream for pain 3. Low residue diet/liquids for Pyloric polyp.  4. Follow up in 3 months. Thirty minutes of face to face patient care time were spent during this visit. All questions were encouraged and answered.

## 2016-04-24 DIAGNOSIS — I10 Essential (primary) hypertension: Secondary | ICD-10-CM | POA: Diagnosis not present

## 2016-04-24 DIAGNOSIS — I483 Typical atrial flutter: Secondary | ICD-10-CM | POA: Diagnosis not present

## 2016-04-24 DIAGNOSIS — I350 Nonrheumatic aortic (valve) stenosis: Secondary | ICD-10-CM | POA: Diagnosis not present

## 2016-04-24 DIAGNOSIS — E43 Unspecified severe protein-calorie malnutrition: Secondary | ICD-10-CM | POA: Diagnosis not present

## 2016-04-30 DIAGNOSIS — D225 Melanocytic nevi of trunk: Secondary | ICD-10-CM | POA: Diagnosis not present

## 2016-04-30 DIAGNOSIS — L821 Other seborrheic keratosis: Secondary | ICD-10-CM | POA: Diagnosis not present

## 2016-04-30 DIAGNOSIS — L814 Other melanin hyperpigmentation: Secondary | ICD-10-CM | POA: Diagnosis not present

## 2016-05-10 ENCOUNTER — Other Ambulatory Visit: Payer: Self-pay | Admitting: Physical Medicine & Rehabilitation

## 2016-05-10 NOTE — Telephone Encounter (Signed)
Patient would like custom cream for her shoulder that you guys had spoken about.  Also, she called about her friends referral Tedra Sen[place].  I have gotten Johnette to pull it and put on your desk to look at.

## 2016-05-11 NOTE — Telephone Encounter (Signed)
I see in the last OV note that you were discussing a custom compound cream for pt. Pt is now interested.

## 2016-05-17 ENCOUNTER — Other Ambulatory Visit: Payer: Self-pay

## 2016-05-17 MED ORDER — ELIQUIS 5 MG PO TABS: 5.0000 mg | ORAL_TABLET | Freq: Two times a day (BID) | ORAL | Status: DC

## 2016-05-21 ENCOUNTER — Telehealth: Payer: Self-pay | Admitting: Cardiovascular Disease

## 2016-05-21 MED ORDER — NONFORMULARY OR COMPOUNDED ITEM: 1.0000 "application " | Freq: Three times a day (TID) | Status: DC

## 2016-05-21 NOTE — Telephone Encounter (Signed)
New Message:    Pt still have problems with pulse rate,it is still low. She wants to know should she come in to see him,might need to adjust her medicine.

## 2016-05-21 NOTE — Telephone Encounter (Signed)
Spoke with patient and reviewed Dr. Elmarie Shiley advice that she should continue acebutolol 200 mg in the am.  I advised her that he would prefer her BP be high normal rather than low due to potential for falls with hypotension.  I advised her to continue to monitor heart rate and BP and she asked when should she take additional acebutolol.  I advised her to only take additional medication if heart rate is >110 bpm and if her BP will tolerate.  I moved her follow-up appointment from September to Aug. 16 and advised her to call back with questions or concerns prior to that appointment. She verbalized understanding and agreement and thanked me for the call.

## 2016-05-21 NOTE — Telephone Encounter (Signed)
Prescription written

## 2016-05-21 NOTE — Telephone Encounter (Signed)
Spoke with patient who states she saw Dr. Laurann Montana 2-3 weeks ago and he advised her to take Acebutolol 400 mg BID for bradycardia.  She had previously taken 600 mg BID prior to discharge from [facility] in March.  She states heart rate continued to be low so 5-6 days ago Dr. Laurann Montana advised her to take 200 mg Acebutolol in the am only.  She states yesterday BP was 102/56 mmHg and pulse was 64 bpm.  She states she would like Dr. Elmarie Shiley advice on what to do.  She does not have any additional BP or pulse readings and states she cannot take these on her own but has an aide coming in to check it today at 6 pm.  She states her pulse has not been above 60 bpm to her knowledge in quite some time and she does not feel well.  I advised her that I will discuss with Dr. Acie Fredrickson who is in the office today and will call her back with his advice.  She verbalized understanding and agreement and thanked me for the call.

## 2016-05-21 NOTE — Addendum Note (Signed)
Addended by: Alger Simons T on: 05/21/2016 04:51 PM   Modules accepted: Orders

## 2016-05-21 NOTE — Telephone Encounter (Signed)
We have titrated the Acebutolol up and down according to her clincal condition. I agree with decreasing the dose as her medical doctor has recommended.

## 2016-05-22 ENCOUNTER — Telehealth: Payer: Self-pay | Admitting: Physical Medicine & Rehabilitation

## 2016-05-22 MED ORDER — NONFORMULARY OR COMPOUNDED ITEM
1.0000 | Freq: Three times a day (TID) | Status: DC
Start: 2016-05-22 — End: 2016-07-04

## 2016-05-22 NOTE — Addendum Note (Signed)
Addended by: Caro Hight on: 05/22/2016 11:26 AM   Modules accepted: Orders

## 2016-05-22 NOTE — Addendum Note (Signed)
Addended by: Caro Hight on: 05/22/2016 11:05 AM   Modules accepted: Orders

## 2016-05-22 NOTE — Telephone Encounter (Addendum)
Arbie Cookey notified Rx for cream written.  [facility].  Was unable to speak to pharmacist to find out if they can make the cream (phone static issues x2) I left message with formula on VM and a requst to notify office if unable to make.  [facility] is a Architect if this does not work.  [facility] called back and unable to make compound. Will call to [facility].  [place] notified.

## 2016-05-22 NOTE — Telephone Encounter (Signed)
Pharmacy needs someone to call them about a prescription that was sent to them.  Their number is [phone].

## 2016-05-23 NOTE — Telephone Encounter (Signed)
I spoke with pharmacy 

## 2016-05-24 ENCOUNTER — Telehealth: Payer: Self-pay | Admitting: Cardiovascular Disease

## 2016-05-24 DIAGNOSIS — R404 Transient alteration of awareness: Secondary | ICD-10-CM | POA: Diagnosis not present

## 2016-05-24 DIAGNOSIS — R42 Dizziness and giddiness: Secondary | ICD-10-CM | POA: Diagnosis not present

## 2016-05-24 NOTE — Telephone Encounter (Signed)
New Message  Pt call requesting to speak with RN about coming into the office to get some blood work complete. Pt states she is still experiencing low pulse rate of 48. Please call back to discuss

## 2016-05-24 NOTE — Telephone Encounter (Signed)
Spoke with patient who states she continues to experience low pulse and hypotension.  She states she had an episode of blurred vision in her right eye yesterday and called 911.  She was evaluated by EMTs and was advised it was possibly a TIA because the symptoms had resolved by the time the EMTs arrived.  States her ekg showed atrial fib but she does not know what the rate was; states her rate on her home machine varies from 50's to 70 and BP averages 123XX123 mmHg systolic and 70 mmHg diastolic despite reduction in acebutolol.  She requests an appointment to see Dr. Acie Fredrickson.  I scheduled her for an office visit tomorrow at 11:45 and advised her that if she has visual changes tonight or other symptoms of stroke to call 911 and go to hospital for evaluation.  She verbalized understanding and agreement and thanked me for the call.

## 2016-05-25 ENCOUNTER — Encounter: Payer: Self-pay | Admitting: Cardiovascular Disease

## 2016-05-25 ENCOUNTER — Ambulatory Visit (INDEPENDENT_AMBULATORY_CARE_PROVIDER_SITE_OTHER): Payer: Medicare Other | Admitting: Cardiovascular Disease

## 2016-05-25 VITALS — BP 90/62 | HR 70 | Ht 62.0 in | Wt 114.6 lb

## 2016-05-25 DIAGNOSIS — I25119 Atherosclerotic heart disease of native coronary artery with unspecified angina pectoris: Secondary | ICD-10-CM

## 2016-05-25 DIAGNOSIS — I480 Paroxysmal atrial fibrillation: Secondary | ICD-10-CM

## 2016-05-25 DIAGNOSIS — G459 Transient cerebral ischemic attack, unspecified: Secondary | ICD-10-CM | POA: Diagnosis not present

## 2016-05-25 LAB — BASIC METABOLIC PANEL
BUN: 14 mg/dL (ref 7–25)
CO2: 23 mmol/L (ref 20–31)
Calcium: 8.8 mg/dL (ref 8.6–10.4)
Chloride: 100 mmol/L (ref 98–110)
Creat: 0.6 mg/dL (ref 0.60–0.93)
GLUCOSE: 113 mg/dL — AB (ref 65–99)
POTASSIUM: 4.2 mmol/L (ref 3.5–5.3)
SODIUM: 135 mmol/L (ref 135–146)

## 2016-05-25 NOTE — Patient Instructions (Signed)
Medication Instructions:  Your physician recommends that you continue on your current medications as directed. Please refer to the Current Medication list given to you today.   Labwork: TODAY - basic metabolic panel   Testing/Procedures: Your physician has requested that you have a carotid duplex. This test is an ultrasound of the carotid arteries in your neck. It looks at blood flow through these arteries that supply the brain with blood. Allow one hour for this exam. There are no restrictions or special instructions.   Follow-Up: You have been referred to Neurology for possible TIA   Your physician wants you to follow-up in: 6 months with Dr. Acie Fredrickson.  You will receive a reminder letter in the mail two months in advance. If you don't receive a letter, please call our office to schedule the follow-up appointment.   If you need a refill on your cardiac medications before your next appointment, please call your pharmacy.   Thank you for choosing [facility]! Christen Bame, RN [phone]

## 2016-05-25 NOTE — Progress Notes (Signed)
Cardiology Office Note:    Date:  05/25/2016   ID:  Angela [name], DOB Jun 03, 1936, MRN [id number]  PCP:  Irven Shelling, MD  Cardiologist:  Dr. Fransico Him  >> Patient requests FU with Dr. [name]  Electrophysiologist:  n/a  Chief Complaint  Patient presents with  . Follow-up    PAF ,    Problem List 1. Paroxysmal atrial fib 2. Atrial fib  3. Rheumatiod arthritis  4. Hx of gastric outlet obstruction     History of Present Illness:     Angela [name] is a 80 y.o. female with a hx of CAD, s/p prior PCI to the LAD, HL, HTN, PVCs, rheumatoid arthritis, peptic ulcer disease, aortic stenosis.  She was seen for bradycardia and Holter in 8/15 showed NSR with PACs and PVCs and ATach.  Toprol was changed to Acebutolol.  Bradycardia resolved.  Last seen by Dr. Fransico Him in 4/16.  Prior echo suggested mod AS.  Repeat limited echo demonstrated just mild AS.   I saw her in 2/17 with increased swelling in her feet.  I felt her LE edema was multifactorial and related to venous insufficiency, inactivity, diastolic dysfunction.  I asked her to take extra Furosemide prn, wear compression stockings and to keep her legs elevated.  Admitted 3/17 with gastric outlet obstruction noted on EGD.  She had an NG tube placed and underwent EGD demonstrating a polypoid lesion in the pyloric channel with surrounding ulcer and edema.  Bx was neg for malignancy.  Hospital course was c/b AFlutter with RVR and she was seen by Cardiology.  Echo demonstrated mod AS, mod TR, EF 45%, LAE.  Acebutolol was increased to 400 bid.  She converted to NSR.   She also had episodes of PSVT. She was given prn Propranolol to use.  CHADS2-VASc=6 (CHF, vascular dz, female, age, HTN).  She was placed on Apixaban.    Last seen by me 01/30/16.  She was in NSR.  I decreased her Acebutolol due to low BP.      Since her Myoview, she has had no further chest pain.  She feels fatigued and dizzy at times.  Denies any significant  change in her breathing.  Denies syncope, PND.  LE edema is chronic and stable.    Mar 21, 2016:  Angela [name] is seen today .   I saw her in the hospital for an episode of PAF that occurred during an admission for small bowel obstruction . Has had some dizziness.    Not eating well Has lost some weight - has lost 30-40 lbs over the past year.   Her acebutolol dose is 400 mg in the AM, 200 mg in the PM   May 25, 2016:  Had TIA like symptoms 2 days ago Blurry vision in the right eye. , no weakness in her arms and legs.   Was seen by EMS  She did not go to the hospital . HR is irregular  (has PVCs on monitor )  Has had some leg swelling .  Recommended leg elevation and compression hose  ( cant put on the compression hose due to the RA in her hands )   Had a cath in may,   Normal LV function, Patent LAD stent  Echo :  EF 45% , mild pulmonary HTN, mild MR   We decreased her Acebutolol to 200 mg a day and she feels better     Past Medical History  Diagnosis Date  .  Cervical stenosis of spine   . Spondylolisthesis of lumbar region   . A-fib ([place])   . Asthma   . Depression   . Dyslipidemia   . GERD (gastroesophageal reflux disease)   . Obesity   . PVC's (premature ventricular contractions)   . PUD (peptic ulcer disease)     can not take aspirin  . Osteoporosis     alendronate since 2012  . Macular degeneration   . Aortic stenosis     mild by echo 03/2012  . Hypertension   . CAD (coronary artery disease)     s/p PCI with BMS to mid LAD--2/08 not on aspirin due to frequent ulcers.  Repeat cath for CP 09/2012 with patent LAD stent and otherwise normal coronary arteries  . Elbow fracture, right     history of- never any surgery  . Rheumatoid arthritis(714.0)     Dr. Lenna Gilford follows  . Impaired physical mobility     due to rheumatoid arthritis "ambulates with walker" limited free standing.    Past Surgical History  Procedure Laterality Date  . Fracture surgery  09/2011     left ankle screw and pin removed  . Cardiac catheterization  09/2012    patent LAD stent and otherwise normal coronary arteries  . Cervical fusion      limited  ROM  . Tonsillectomy    . Cataract extraction, bilateral Bilateral   . Joint replacement Bilateral     BTKA  . Hand surgery Bilateral     x4 digits 2-5 both hands  . Ankle fusion Bilateral     x2 both  . Cholecystectomy    . Esophagogastroduodenoscopy (egd) with propofol N/A 11/01/2015    Procedure: ESOPHAGOGASTRODUODENOSCOPY (EGD) WITH PROPOFOL w/ Dialtation;  Surgeon: Garlan Fair, MD;  Location: [facility];  Service: Endoscopy;  Laterality: N/A;  . Esophagogastroduodenoscopy N/A 01/07/2016    Procedure: ESOPHAGOGASTRODUODENOSCOPY (EGD);  Surgeon: Wilford Corner, MD;  Location: [facility];  Service: Endoscopy;  Laterality: N/A;  . Cardiac catheterization N/A 03/07/2016    Procedure: Left Heart Cath and Coronary Angiography;  Surgeon: Peter M Martinique, MD;  Location: [facility];  Service: Cardiovascular;  Laterality: N/A;    Current Medications: Outpatient Prescriptions Prior to Visit  Medication Sig Dispense Refill  . acebutolol (SECTRAL) 200 MG capsule TAKE 200 MG 1 CAPSULE BY MOUTH ONCE IN THE MORNING     . butalbital-acetaminophen-caffeine (FIORICET, ESGIC) 50-325-40 MG per tablet Take 1 tablet by mouth every 6 (six) hours as needed for headache or migraine. For migraines    . Calcium Carbonate-Vitamin D (CALCIUM 500+D PO) Take 1 tablet by mouth 2 (two) times daily.     . DENTAGEL 1.1 % GEL dental gel Place 1 application onto teeth at bedtime.     . diclofenac sodium (VOLTAREN) 1 % GEL Apply 2 g topically 2 (two) times daily as needed. For pain (Patient taking differently: Apply 2 g topically 2 (two) times daily as needed (for pain). For pain) 3 Tube 5  . Docusate Calcium (STOOL SOFTENER PO) Take 1 tablet by mouth at bedtime.    Marland Kitchen ELIQUIS 5 MG TABS tablet Take 1 tablet (5 mg total) by mouth 2 (two) times daily.  60 tablet 2  . EPIPEN 2-PAK 0.3 MG/0.3ML SOAJ Inject 0.3 mg into the muscle daily as needed (for allergic reaction).     . fluticasone (FLONASE) 50 MCG/ACT nasal spray Place 1 spray into both nostrils daily as needed for allergies.  0  . folic acid (FOLVITE) 1 MG tablet Take 1 mg by mouth daily.     . furosemide (LASIX) 40 MG tablet Take 1 tablet (40 mg total) by mouth daily. You may take extra 1/2 tablet on days when you have extra swelling (Patient taking differently: Take 40 mg by mouth daily. ) 45 tablet 6  . lidocaine (LIDODERM) 5 % APPLY 1 PATCH ONTO THE SKIN DAILY REMOVE AND DISCARD PATCH WITHIN 12 HOURS OR AS DIRECTED BY PRESCRIBER (Patient taking differently: Place 1 patch onto the skin every 12 (twelve) hours. APPLY NECK AND ELBOW) 90 patch 5  . LORazepam (ATIVAN) 2 MG tablet Take 2 mg by mouth at bedtime.  5  . methotrexate (RHEUMATREX) 2.5 MG tablet Take 15 mg by mouth every Saturday.     . nitroGLYCERIN (NITROSTAT) 0.4 MG SL tablet Place 1 tablet (0.4 mg total) under the tongue every 5 (five) minutes as needed for chest pain. 25 tablet 1  . NONFORMULARY OR COMPOUNDED ITEM Apply 1 application topically 3 (three) times daily. To your shoulders and/or hands   20% ketoprofen, 4%ketamine, 5% amitriptyline  60 gm supply 1 each 4  . nystatin cream (MYCOSTATIN) Apply 1 application topically daily as needed for dry skin.    Marland Kitchen oxyCODONE-acetaminophen (PERCOCET) 7.5-325 MG tablet Take 1 tablet by mouth every 6 (six) hours as needed for moderate pain. 120 tablet 0  . pantoprazole (PROTONIX) 40 MG tablet Take 40 mg by mouth 2 (two) times daily.     . predniSONE (DELTASONE) 5 MG tablet Take 5 mg by mouth daily with breakfast.     . PREVIDENT 0.2 % SOLN Take 1 each by mouth at bedtime. Rinses with about 1 capful nightly    . propranolol (INDERAL) 10 MG tablet Take 1 tablet (10 mg total) by mouth daily as needed (palpitations lasting more than 5 minutes). 30 tablet 0  . simvastatin (ZOCOR) 40 MG  tablet TAKE 1 TABLET BY MOUTH AT BEDTIME 90 tablet 3  . TOVIAZ 8 MG TB24 tablet Take 8 mg by mouth daily.     No facility-administered medications prior to visit.     Allergies:   Peanut-containing drug products; Reglan; Clindamycin/lincomycin; Codeine; Iohexol; Pineapple; and Shellfish allergy   Social History   Social History  . Marital Status: Widowed    Spouse Name: N/A  . Number of Children: N/A  . Years of Education: N/A   Social History Main Topics  . Smoking status: Never Smoker   . Smokeless tobacco: Never Used  . Alcohol Use: No  . Drug Use: No  . Sexual Activity: Not Asked   Other Topics Concern  . None   Social History Narrative     Family History:  The patient's family history includes Arrhythmia in her mother; Lung cancer in her mother; Melanoma in her brother. There is no history of Heart attack.   ROS:   Please see the history of present illness.    Review of Systems  Constitution: Positive for decreased appetite, malaise/fatigue and weight loss.  Cardiovascular: Positive for irregular heartbeat and leg swelling.  Hematologic/Lymphatic: Bruises/bleeds easily.  Musculoskeletal: Positive for back pain, joint pain, joint swelling and myalgias.  Gastrointestinal: Positive for constipation and diarrhea.  Neurological: Positive for dizziness and loss of balance.  Psychiatric/Behavioral: The patient is nervous/anxious.   All other systems reviewed and are negative.   Physical Exam:    VS:  BP 90/62 mmHg  Pulse 70  Ht 5\' 2"  (1.575  m)  Wt 114 lb 9.6 oz (51.982 kg)  BMI 20.96 kg/m2  SpO2 98%   GEN: Well nourished, well developed, in no acute distress HEENT: normal Neck: no JVD, no masses Cardiac: Irreg. Irreg.  Normal 99991111, ; 1/6 systolic murmur RUSB, trace- 1+ bilateral LE edema   Respiratory:  clear to auscultation bilaterally; no   rales GI: soft, nontender  MS: bilat hands with rheumatoid deformities (ulnar deviation, swan neck) Skin: warm and dry    Neuro:  no focal deficits  Psych: Alert and oriented x 3, normal affect  Wt Readings from Last 3 Encounters:  05/25/16 114 lb 9.6 oz (51.982 kg)  03/21/16 113 lb (51.256 kg)  03/07/16 109 lb (49.442 kg)      Studies/Labs Reviewed:     EKG:  EKG is  Not ordered today.  The ekg ordered today demonstrates n/a   Recent Labs: 01/12/2016: ALT 28; Magnesium 2.0 01/13/2016: TSH 1.427 03/05/2016: BUN 12; Creat 0.64; Hemoglobin 13.5; Platelets 185; Potassium 3.8; Sodium 136   Recent Lipid Panel    Component Value Date/Time   CHOL 129 02/24/2015 1030   TRIG 81 01/09/2016 0515   HDL 58.80 02/24/2015 1030   CHOLHDL 2 02/24/2015 1030   VLDL 17.6 02/24/2015 1030   LDLCALC 53 02/24/2015 1030    Additional studies/ records that were reviewed today include:   Myoview 02/29/16 EF 45%, inf AK, inferolateral scar with peri-infarct ischemia, intermediate to high risk study  Echo 01/14/16 EF 45%, diff HK, mod AS, mean 16 mmHg, mild MR, mod to severe LAE, mod TR, PASP 34 mmHg  Limited Echo 03/09/15 Mild AS, mean 13 mmHg, mod LAE  Echo 02/24/15 EF 55-60%, inf HK, Gr 2 DD, probable mod AS, mean 18 mmHg, MAC, mild MR, mild LAE, mild RAE, mild to mod TR, PASP 39 mmHg  Holter 8/15 NSR,PVCs, PACs, WCT (NSVT vs aberration)  LHC (11/13):  Proximal LAD stent patent, normal left main, circumflex and RCA, EF 50-55% with inferobasal akinesis, LVEDP 17 mm Hg  Echo (4/15):  Mild LVH, EF 60-65%, normal wall motion, grade 1 diastolic dysfunction, moderate aortic stenosis (mean 17 mm Hg), trivial AI, borderline dilated ascending aorta, severe LAE, mild RAE, mild TR, PASP 35 mm Hg  Nuclear (5/13):  Breast attenuation, no ischemia, EF 55%-low risk   ASSESSMENT:     No diagnosis found.  PLAN:     In order of problems listed above:  1. PAF/Flutter -  In NSR at [facility].  CHADS2-VASc=6.  Continue long term anticoagulation with Apixaban.   2. SVT - No apparent recurrence.   3. Cardiomyopathy - EF  lower on recent echo than prior studies.  Suspect this is from RVR.  Consider repeat Echo in 2-3 mos -   4. CAD - She is now off Plavix as she is on Apixaban.  Continue statin.    Stress myoview was abnormal  But her stent was patent and she had no minimal irreg.   5. Aortic stenosis - Mod by recent echo.      6. HTN - Controlled.   7. Edema - Multifactorial. Recommended leg elvatinon She cannot wear compression hose.    8. Possible TIA:    Will get carotid duplex scan  Will refer to neuro  She is on eliquis for her PAF    Mertie Moores, MD  05/25/2016 12:02 PM    [facility] [address]  [place], [place]  [address] Pager [phone] Phone: [phone]; Fax: [phone]

## 2016-05-25 NOTE — Addendum Note (Signed)
Addended by: Eulis Foster on: 05/25/2016 12:52 PM   Modules accepted: Orders

## 2016-05-29 DIAGNOSIS — M0579 Rheumatoid arthritis with rheumatoid factor of multiple sites without organ or systems involvement: Secondary | ICD-10-CM | POA: Diagnosis not present

## 2016-05-29 DIAGNOSIS — M2550 Pain in unspecified joint: Secondary | ICD-10-CM | POA: Diagnosis not present

## 2016-05-29 DIAGNOSIS — Z79899 Other long term (current) drug therapy: Secondary | ICD-10-CM | POA: Diagnosis not present

## 2016-05-29 DIAGNOSIS — M810 Age-related osteoporosis without current pathological fracture: Secondary | ICD-10-CM | POA: Diagnosis not present

## 2016-06-04 ENCOUNTER — Ambulatory Visit (HOSPITAL_COMMUNITY)
Admission: RE | Admit: 2016-06-04 | Discharge: 2016-06-04 | Disposition: A | Discharge status: Home / Self Care | Payer: Medicare Other | Source: Ambulatory Visit | Attending: Internal Medicine | Admitting: Internal Medicine

## 2016-06-04 ENCOUNTER — Other Ambulatory Visit: Payer: Self-pay | Admitting: Cardiovascular Disease

## 2016-06-04 DIAGNOSIS — I6523 Occlusion and stenosis of bilateral carotid arteries: Secondary | ICD-10-CM

## 2016-06-04 DIAGNOSIS — R42 Dizziness and giddiness: Secondary | ICD-10-CM | POA: Diagnosis not present

## 2016-06-04 DIAGNOSIS — H539 Unspecified visual disturbance: Secondary | ICD-10-CM

## 2016-06-04 DIAGNOSIS — I480 Paroxysmal atrial fibrillation: Secondary | ICD-10-CM

## 2016-06-04 DIAGNOSIS — I2510 Atherosclerotic heart disease of native coronary artery without angina pectoris: Secondary | ICD-10-CM | POA: Insufficient documentation

## 2016-06-04 DIAGNOSIS — I10 Essential (primary) hypertension: Secondary | ICD-10-CM | POA: Diagnosis not present

## 2016-06-04 DIAGNOSIS — E785 Hyperlipidemia, unspecified: Secondary | ICD-10-CM | POA: Diagnosis not present

## 2016-06-04 DIAGNOSIS — I6501 Occlusion and stenosis of right vertebral artery: Secondary | ICD-10-CM | POA: Insufficient documentation

## 2016-06-04 DIAGNOSIS — G459 Transient cerebral ischemic attack, unspecified: Secondary | ICD-10-CM

## 2016-06-04 DIAGNOSIS — K219 Gastro-esophageal reflux disease without esophagitis: Secondary | ICD-10-CM | POA: Diagnosis not present

## 2016-06-04 DIAGNOSIS — F329 Major depressive disorder, single episode, unspecified: Secondary | ICD-10-CM | POA: Insufficient documentation

## 2016-06-12 ENCOUNTER — Ambulatory Visit (INDEPENDENT_AMBULATORY_CARE_PROVIDER_SITE_OTHER): Payer: Medicare Other | Admitting: Neurology

## 2016-06-12 ENCOUNTER — Encounter: Payer: Self-pay | Admitting: Neurology

## 2016-06-12 VITALS — BP 108/66 | HR 44 | Wt 114.9 lb

## 2016-06-12 DIAGNOSIS — I6523 Occlusion and stenosis of bilateral carotid arteries: Secondary | ICD-10-CM | POA: Diagnosis not present

## 2016-06-12 DIAGNOSIS — H538 Other visual disturbances: Secondary | ICD-10-CM

## 2016-06-12 NOTE — Patient Instructions (Signed)
The symptoms are not classic for a mini-stroke but it is still possible.  It could have been a migraine, but not sure.  The main thing is that you are already protected from stroke.  Follow up as needed.

## 2016-06-12 NOTE — Progress Notes (Signed)
NEUROLOGY CONSULTATION NOTE  Angela [name] MRN: [id number] DOB: Mar 29, 1936  Referring provider: Dr. Acie Fredrickson Primary care provider: Dr. Laurann Montana  Reason for consult:  TIA  HISTORY OF PRESENT ILLNESS: Angela [name] is a 80 year old right-handed woman with paroxysmal atrial fibrillation, CAD, hypertension, hyperlipidemia, aortic stenosis, PUD, and rheumatoid arthritis who presents for TIA.  History obtained by patient, her caregiver and cardiology note.  While admitted to the hospital in March 2017 with gastric outlet obstruction, she exhibited atrial flutter with RVR.  Echo showed EF 45% with moderate to severe LAE, moderate AS, and moderate TR.  She is on Eliquis and acebutolol.  She had a follow up Myoview on 02/29/16, which showed EF 45% and inferolateral scar with peri-infarct ischemia.  She underwent left heart catheterization and coronary angiography on 03/07/16, which revealed no obstructive CAD.  Last month, when she woke up from a nap while watching TV, she noted monocular blurred vision in the right eye.  There was no darkening or double vision.  There was no focal numbness or weakness.  There was some right sided headache, which she occasionally has.  It lasted 5 to 10 minutes.  EMS arrived but she declined going to the ED.  Carotid doppler from 06/04/16 revealed occluded right vertebral artery but no hemodynamically significant internal carotid artery stenosis.  She is on simvastatin 40mg  daily.  Last lipid panel from 02/24/15 showed cholesterol 129, TG 88, HDL 58.80 and LDL 53.   PAST MEDICAL HISTORY: Past Medical History:  Diagnosis Date  . A-fib ([place])   . Aortic stenosis    mild by echo 03/2012  . Asthma   . CAD (coronary artery disease)    s/p PCI with BMS to mid LAD--2/08 not on aspirin due to frequent ulcers.  Repeat cath for CP 09/2012 with patent LAD stent and otherwise normal coronary arteries  . Cervical stenosis of spine   . Depression   . Dyslipidemia   . Elbow  fracture, right    history of- never any surgery  . GERD (gastroesophageal reflux disease)   . Hypertension   . Impaired physical mobility    due to rheumatoid arthritis "ambulates with walker" limited free standing.  . Macular degeneration   . Obesity   . Osteoporosis    alendronate since 2012  . PUD (peptic ulcer disease)    can not take aspirin  . PVC's (premature ventricular contractions)   . Rheumatoid arthritis(714.0)    Dr. Lenna Gilford follows  . Spondylolisthesis of lumbar region     PAST SURGICAL HISTORY: Past Surgical History:  Procedure Laterality Date  . ANKLE FUSION Bilateral    x2 both  . CARDIAC CATHETERIZATION  09/2012   patent LAD stent and otherwise normal coronary arteries  . CARDIAC CATHETERIZATION N/A 03/07/2016   Procedure: Left Heart Cath and Coronary Angiography;  Surgeon: Peter M Martinique, MD;  Location: [facility];  Service: Cardiovascular;  Laterality: N/A;  . CATARACT EXTRACTION, BILATERAL Bilateral   . CERVICAL FUSION     limited  ROM  . CHOLECYSTECTOMY    . ESOPHAGOGASTRODUODENOSCOPY N/A 01/07/2016   Procedure: ESOPHAGOGASTRODUODENOSCOPY (EGD);  Surgeon: Wilford Corner, MD;  Location: [facility];  Service: Endoscopy;  Laterality: N/A;  . ESOPHAGOGASTRODUODENOSCOPY (EGD) WITH PROPOFOL N/A 11/01/2015   Procedure: ESOPHAGOGASTRODUODENOSCOPY (EGD) WITH PROPOFOL w/ Dialtation;  Surgeon: Garlan Fair, MD;  Location: [facility];  Service: Endoscopy;  Laterality: N/A;  . FRACTURE SURGERY  09/2011   left ankle screw and pin  removed  . HAND SURGERY Bilateral    x4 digits 2-5 both hands  . JOINT REPLACEMENT Bilateral    BTKA  . TONSILLECTOMY      MEDICATIONS: Current Outpatient Prescriptions on File Prior to Visit  Medication Sig Dispense Refill  . acebutolol (SECTRAL) 200 MG capsule TAKE 200 MG 1 CAPSULE BY MOUTH ONCE IN THE MORNING     . butalbital-acetaminophen-caffeine (FIORICET, ESGIC) 50-325-40 MG per tablet Take 1 tablet by mouth every 6  (six) hours as needed for headache or migraine. For migraines    . Calcium Carbonate-Vitamin D (CALCIUM 500+D PO) Take 1 tablet by mouth 2 (two) times daily.     . DENTAGEL 1.1 % GEL dental gel Place 1 application onto teeth at bedtime.     . diclofenac sodium (VOLTAREN) 1 % GEL Apply 2 g topically 2 (two) times daily as needed. For pain (Patient taking differently: Apply 2 g topically 2 (two) times daily as needed (for pain). For pain) 3 Tube 5  . Docusate Calcium (STOOL SOFTENER PO) Take 1 tablet by mouth at bedtime.    Marland Kitchen ELIQUIS 5 MG TABS tablet Take 1 tablet (5 mg total) by mouth 2 (two) times daily. 60 tablet 2  . EPIPEN 2-PAK 0.3 MG/0.3ML SOAJ Inject 0.3 mg into the muscle daily as needed (for allergic reaction).     . fluticasone (FLONASE) 50 MCG/ACT nasal spray Place 1 spray into both nostrils daily as needed for allergies.   0  . folic acid (FOLVITE) 1 MG tablet Take 1 mg by mouth daily.     . furosemide (LASIX) 40 MG tablet Take 1 tablet (40 mg total) by mouth daily. You may take extra 1/2 tablet on days when you have extra swelling (Patient taking differently: Take 40 mg by mouth daily. ) 45 tablet 6  . lidocaine (LIDODERM) 5 % APPLY 1 PATCH ONTO THE SKIN DAILY REMOVE AND DISCARD PATCH WITHIN 12 HOURS OR AS DIRECTED BY PRESCRIBER (Patient taking differently: Place 1 patch onto the skin every 12 (twelve) hours. APPLY NECK AND ELBOW) 90 patch 5  . LORazepam (ATIVAN) 2 MG tablet Take 2 mg by mouth at bedtime.  5  . methotrexate (RHEUMATREX) 2.5 MG tablet Take 15 mg by mouth every Saturday.     . nitroGLYCERIN (NITROSTAT) 0.4 MG SL tablet Place 1 tablet (0.4 mg total) under the tongue every 5 (five) minutes as needed for chest pain. 25 tablet 1  . NONFORMULARY OR COMPOUNDED ITEM Apply 1 application topically 3 (three) times daily. To your shoulders and/or hands   20% ketoprofen, 4%ketamine, 5% amitriptyline  60 gm supply 1 each 4  . nystatin cream (MYCOSTATIN) Apply 1 application topically  daily as needed for dry skin.    Marland Kitchen oxyCODONE-acetaminophen (PERCOCET) 7.5-325 MG tablet Take 1 tablet by mouth every 6 (six) hours as needed for moderate pain. 120 tablet 0  . pantoprazole (PROTONIX) 40 MG tablet Take 40 mg by mouth 2 (two) times daily.     . predniSONE (DELTASONE) 5 MG tablet Take 5 mg by mouth daily with breakfast.     . PREVIDENT 0.2 % SOLN Take 1 each by mouth at bedtime. Rinses with about 1 capful nightly    . propranolol (INDERAL) 10 MG tablet Take 1 tablet (10 mg total) by mouth daily as needed (palpitations lasting more than 5 minutes). 30 tablet 0  . simvastatin (ZOCOR) 40 MG tablet TAKE 1 TABLET BY MOUTH AT BEDTIME 90 tablet 3  .  TOVIAZ 8 MG TB24 tablet Take 8 mg by mouth daily.     No current facility-administered medications on file prior to visit.     ALLERGIES: Allergies  Allergen Reactions  . Peanut-Containing Drug Products Anaphylaxis  . Reglan [Metoclopramide] Other (See Comments)    "Messed my mind up"  . Clindamycin/Lincomycin Diarrhea  . Codeine Nausea Only  . Iohexol Swelling     Desc: pt received hypaque 60% in 1994 and had erythema, hives & lips swell.  She did ok w/o premeds in 8/05 at [facility].  Pt. takes daily prednisone for rheumatoid arthritis.  Poor venous access today (7/7/6) so we did her w/o iv contrast.   . Pineapple Swelling  . Shellfish Allergy Swelling    Lips swell    FAMILY HISTORY: Family History  Problem Relation Age of Onset  . Lung cancer Mother   . Arrhythmia Mother   . Melanoma Brother   . Heart attack Neg Hx     SOCIAL HISTORY: Social History   Social History  . Marital status: Widowed    Spouse name: N/A  . Number of children: N/A  . Years of education: N/A   Occupational History  . Not on file.   Social History Main Topics  . Smoking status: Never Smoker  . Smokeless tobacco: Never Used  . Alcohol use No  . Drug use: No  . Sexual activity: Not on file   Other Topics Concern  . Not on file   Social  History Narrative  . No narrative on file    REVIEW OF SYSTEMS: Constitutional: No fevers, chills, or sweats, no generalized fatigue, change in appetite Eyes: No visual changes, double vision, eye pain Ear, nose and throat: No hearing loss, ear pain, nasal congestion, sore throat Cardiovascular: No chest pain, palpitations Respiratory:  No shortness of breath at rest or with exertion, wheezes GastrointestinaI: No nausea, vomiting, diarrhea, abdominal pain, fecal incontinence Genitourinary:  No dysuria, urinary retention or frequency Musculoskeletal:  Neck pain Integumentary: No rash, pruritus, skin lesions Neurological: as above Psychiatric: No depression, insomnia, anxiety Endocrine: No palpitations, fatigue, diaphoresis, mood swings, change in appetite, change in weight, increased thirst Hematologic/Lymphatic:  No purpura, petechiae. Allergic/Immunologic: no itchy/runny eyes, nasal congestion, recent allergic reactions, rashes  PHYSICAL EXAM: Vitals:   06/12/16 1243  BP: 108/66  Pulse: (!) 44   General: No acute distress.  Patient appears well-groomed.  Head:  Normocephalic/atraumatic Eyes:  fundi examined but not visualized Neck: supple, paraspinal tenderness, full range of motion Back: No paraspinal tenderness Heart: regular rate and rhythm Lungs: Clear to auscultation bilaterally. Vascular: No carotid bruits. Neurological Exam: Mental status: alert and oriented to person, place, and time, recent and remote memory intact, fund of knowledge intact, attention and concentration intact, speech fluent and not dysarthric, language intact. Cranial nerves: CN I: not tested CN II: pupils equal, round and reactive to light, visual fields intact CN III, IV, VI:  full range of motion, no nystagmus, no ptosis CN V: facial sensation intact CN VII: upper and lower face symmetric CN VIII: hearing intact CN IX, X: gag intact, uvula midline CN XI: sternocleidomastoid and trapezius  muscles intact CN XII: tongue midline Bulk & Tone: normal, no fasciculations. Motor:  5/5 throughout  Sensation: temperature and vibration sensation intact. Deep Tendon Reflexes:  Trace in upper extremities, absent in lower extremities, toes downgoing. Finger to nose testing:  Without dysmetria. Heel to shin:  Without dysmetria.  Gait:  Unsteady, cautious gait.  Unable to  tandem walk.  Requires several steps to turn.  Romberg with sway.  IMPRESSION: Transient unilateral blurred vision.  Not classic symptoms for amarosis fugax or TIA.  Possibly migraine but that is a diagnosis of exclusion.    The main thing is that she is already on optimal secondary stroke prevention anyway: Anticoagulation Statin therapy Blood pressure management  Follow up as needed.  Thank you for allowing me to take part in the care of this patient.  Metta Clines, DO  CC:  [name], MD  Mertie Moores, MD

## 2016-06-12 NOTE — Progress Notes (Signed)
Chart forwarded.  

## 2016-06-20 ENCOUNTER — Ambulatory Visit: Payer: Medicare Other | Admitting: Cardiovascular Disease

## 2016-06-21 ENCOUNTER — Other Ambulatory Visit: Payer: Self-pay | Admitting: Internal Medicine

## 2016-06-21 DIAGNOSIS — Z1231 Encounter for screening mammogram for malignant neoplasm of breast: Secondary | ICD-10-CM

## 2016-07-04 ENCOUNTER — Encounter: Payer: Self-pay | Admitting: Physical Medicine & Rehabilitation

## 2016-07-04 ENCOUNTER — Encounter: Payer: Medicare Other | Attending: Physical Medicine & Rehabilitation | Admitting: Physical Medicine & Rehabilitation

## 2016-07-04 DIAGNOSIS — R269 Unspecified abnormalities of gait and mobility: Secondary | ICD-10-CM | POA: Insufficient documentation

## 2016-07-04 DIAGNOSIS — M4316 Spondylolisthesis, lumbar region: Secondary | ICD-10-CM | POA: Insufficient documentation

## 2016-07-04 DIAGNOSIS — R200 Anesthesia of skin: Secondary | ICD-10-CM | POA: Diagnosis not present

## 2016-07-04 DIAGNOSIS — M069 Rheumatoid arthritis, unspecified: Secondary | ICD-10-CM | POA: Insufficient documentation

## 2016-07-04 DIAGNOSIS — G8929 Other chronic pain: Secondary | ICD-10-CM | POA: Insufficient documentation

## 2016-07-04 DIAGNOSIS — M05711 Rheumatoid arthritis with rheumatoid factor of right shoulder without organ or systems involvement: Secondary | ICD-10-CM

## 2016-07-04 DIAGNOSIS — M4802 Spinal stenosis, cervical region: Secondary | ICD-10-CM | POA: Diagnosis not present

## 2016-07-04 DIAGNOSIS — I6523 Occlusion and stenosis of bilateral carotid arteries: Secondary | ICD-10-CM

## 2016-07-04 DIAGNOSIS — M05712 Rheumatoid arthritis with rheumatoid factor of left shoulder without organ or systems involvement: Secondary | ICD-10-CM

## 2016-07-04 MED ORDER — OXYCODONE-ACETAMINOPHEN 7.5-325 MG PO TABS: 1.0000 | ORAL_TABLET | Freq: Four times a day (QID) | ORAL | 0 refills | Status: DC | PRN

## 2016-07-04 MED ORDER — LIDOCAINE 5 % EX PTCH: 3.0000 | MEDICATED_PATCH | Freq: Two times a day (BID) | CUTANEOUS | 5 refills | Status: DC

## 2016-07-04 MED ORDER — DICLOFENAC SODIUM 1 % TD GEL: 2.0000 g | Freq: Two times a day (BID) | TRANSDERMAL | 5 refills | Status: DC | PRN

## 2016-07-04 NOTE — Patient Instructions (Signed)
PLEASE CALL ME WITH ANY PROBLEMS OR QUESTIONS [phone]  PLEASE BE CAREFUL WITH YOUR BALANCE AND SAFETY AT HOME

## 2016-07-04 NOTE — Progress Notes (Signed)
Subjective:    Patient ID: JAXX AUD, female    DOB: August 14, 1936, 80 y.o.   MRN: [id number]  HPI   Angela [name] is here in follow up of her chronic pain and RA. Her arthritis has increased in severity over the last few months. She lost her daughter in March which hasn't helped things either. Sleep is an issue due to her urinary incontinence. She sees an urologist.   She remains on there percocet, voltaren gel and lidoderm patches which seem to help pain.   She also has noticed numbness over the left distal leg into the dorsum of the foot. She has had no associated weakness or back pain    Pain Inventory Average Pain 5 Pain Right Now 8 My pain is tingling and aching  In the last 24 hours, has pain interfered with the following? General activity 9 Relation with others 8 Enjoyment of life 8 What TIME of day is your pain at its worst? night Sleep (in general) Fair  Pain is worse with: bending and some activites Pain improves with: heat/ice and medication Relief from Meds: 8  Mobility use a walker ability to climb steps?  no do you drive?  no  Function disabled: date disabled . retired I need assistance with the following:  dressing, bathing, meal prep, household duties and shopping  Neuro/Psych bladder control problems weakness numbness tingling trouble walking anxiety  Prior Studies Any changes since last visit?  no  Physicians involved in your care Any changes since last visit?  no   Family History  Problem Relation Age of Onset  . Lung cancer Mother   . Arrhythmia Mother   . Melanoma Brother   . Heart attack Neg Hx    Social History   Social History  . Marital status: Widowed    Spouse name: N/A  . Number of children: N/A  . Years of education: N/A   Social History Main Topics  . Smoking status: Never Smoker  . Smokeless tobacco: Never Used  . Alcohol use No  . Drug use: No  . Sexual activity: Not Asked   Other Topics Concern  . None    Social History Narrative  . None   Past Surgical History:  Procedure Laterality Date  . ANKLE FUSION Bilateral    x2 both  . CARDIAC CATHETERIZATION  09/2012   patent LAD stent and otherwise normal coronary arteries  . CARDIAC CATHETERIZATION N/A 03/07/2016   Procedure: Left Heart Cath and Coronary Angiography;  Surgeon: Peter M Martinique, MD;  Location: [facility];  Service: Cardiovascular;  Laterality: N/A;  . CATARACT EXTRACTION, BILATERAL Bilateral   . CERVICAL FUSION     limited  ROM  . CHOLECYSTECTOMY    . ESOPHAGOGASTRODUODENOSCOPY N/A 01/07/2016   Procedure: ESOPHAGOGASTRODUODENOSCOPY (EGD);  Surgeon: Wilford Corner, MD;  Location: [facility];  Service: Endoscopy;  Laterality: N/A;  . ESOPHAGOGASTRODUODENOSCOPY (EGD) WITH PROPOFOL N/A 11/01/2015   Procedure: ESOPHAGOGASTRODUODENOSCOPY (EGD) WITH PROPOFOL w/ Dialtation;  Surgeon: Garlan Fair, MD;  Location: [facility];  Service: Endoscopy;  Laterality: N/A;  . FRACTURE SURGERY  09/2011   left ankle screw and pin removed  . HAND SURGERY Bilateral    x4 digits 2-5 both hands  . JOINT REPLACEMENT Bilateral    BTKA  . TONSILLECTOMY     Past Medical History:  Diagnosis Date  . A-fib ([place])   . Aortic stenosis    mild by echo 03/2012  . Asthma   . CAD (  coronary artery disease)    s/p PCI with BMS to mid LAD--2/08 not on aspirin due to frequent ulcers.  Repeat cath for CP 09/2012 with patent LAD stent and otherwise normal coronary arteries  . Cervical stenosis of spine   . Depression   . Dyslipidemia   . Elbow fracture, right    history of- never any surgery  . GERD (gastroesophageal reflux disease)   . Hypertension   . Impaired physical mobility    due to rheumatoid arthritis "ambulates with walker" limited free standing.  . Macular degeneration   . Obesity   . Osteoporosis    alendronate since 2012  . PUD (peptic ulcer disease)    can not take aspirin  . PVC's (premature ventricular contractions)   .  Rheumatoid arthritis(714.0)    Dr. Lenna Gilford follows  . Spondylolisthesis of lumbar region    BP 118/78 (BP Location: Left Arm, Patient Position: Sitting, Cuff Size: Normal)   Pulse (!) 58   Resp 16   SpO2 96%   Opioid Risk Score:   Fall Risk Score:  `1  Depression screen PHQ 2/9  Depression screen [facility] 2/9 07/04/2016 02/14/2015  Decreased Interest 0 2  Down, Depressed, Hopeless 1 2  PHQ - 2 Score 1 4  Altered sleeping - 2  Tired, decreased energy - 3  Change in appetite - 2  Feeling bad or failure about yourself  - 2  Trouble concentrating - 2  Moving slowly or fidgety/restless - 1  Suicidal thoughts - 0  PHQ-9 Score - 16     Review of Systems  Constitutional: Positive for unexpected weight change.  Cardiovascular: Positive for leg swelling.  All other systems reviewed and are negative.      Objective:   Physical Exam  She does seem to have lost some weight..  She ambulated with the rolling walker. She takes longer steps and is stable. Changed directions well. She can extend elbow to -30 degrees. Her cervical ROM appears at baseline.  Neurological: She has normal reflexes. No cranial nerve deficit or sensory deficit.  Motor function appears at baseline. Balance improved  Skin: vasculitic lesions, petechiae over distal lower exts from the knee down.  M/S: low back tender to palpation. Needs extra time to stand. Posture is head forward, kyphotic. Right elbow with hematoma and skin tear Psychiatric: She has a normal mood and affect. Her behavior is normal. Judgment and thought content normal.    Assessment & Plan:   ASSESSMENT:  1. History of cervical stenosis/spondylosis with myelopathy.  2. Rheumatoid arthritis.  3. Lumbar spondylolisthesis.  4. Gait disorder related to the above, recent fall with renal failure.  5. Left leg numbness--unclear etiology    PLAN:  1. She was given one prescription for Percocet 7.5/325 1 p.o. q.6  hours p.r.n., 120.   2. Continue  with lidoderm and voltaren gel.  3. Low residue diet/liquids for Pyloric polyp.  4. She will contact me if leg symptoms increase.  Follow up in 3 months. Thirty minutes of face to face patient care time were spent during this visit. All questions were encouraged and answered.

## 2016-07-10 ENCOUNTER — Telehealth: Payer: Self-pay

## 2016-07-10 DIAGNOSIS — F329 Major depressive disorder, single episode, unspecified: Secondary | ICD-10-CM

## 2016-07-10 NOTE — Telephone Encounter (Signed)
Pt called stating that during her last OV on 07/04/16, you both discussed putting her on an antidepressant. She states she didn't think she needed it at the time, but now she would like to try something. Please advise.

## 2016-07-11 DIAGNOSIS — F329 Major depressive disorder, single episode, unspecified: Secondary | ICD-10-CM | POA: Insufficient documentation

## 2016-07-11 MED ORDER — ESCITALOPRAM OXALATE 5 MG PO TABS: 5.0000 mg | ORAL_TABLET | Freq: Every day | ORAL | 4 refills | Status: DC

## 2016-07-11 NOTE — Telephone Encounter (Signed)
Pt advised.

## 2016-07-11 NOTE — Telephone Encounter (Signed)
I have written an rx for low dose lexapro---have sent to pharmacy.

## 2016-07-16 ENCOUNTER — Ambulatory Visit: Payer: Medicare Other | Admitting: Cardiovascular Disease

## 2016-07-18 DIAGNOSIS — N3946 Mixed incontinence: Secondary | ICD-10-CM | POA: Diagnosis not present

## 2016-07-18 DIAGNOSIS — N3944 Nocturnal enuresis: Secondary | ICD-10-CM | POA: Diagnosis not present

## 2016-07-23 ENCOUNTER — Ambulatory Visit: Payer: Medicare Other | Admitting: Neurology

## 2016-07-24 ENCOUNTER — Other Ambulatory Visit: Payer: Self-pay | Admitting: Physician Assistant

## 2016-07-24 DIAGNOSIS — R600 Localized edema: Secondary | ICD-10-CM

## 2016-07-26 ENCOUNTER — Telehealth: Payer: Self-pay | Admitting: Cardiovascular Disease

## 2016-07-26 MED ORDER — ACEBUTOLOL HCL 200 MG PO CAPS: 200.0000 mg | ORAL_CAPSULE | ORAL | 3 refills | Status: DC

## 2016-07-26 NOTE — Telephone Encounter (Signed)
Spoke with Angela [name] who states the pharmacist at [facility] wanted her to call to clarify the  Angela [name]'s Acebutolol Rx.  Angela [name] states she was originally prescribed a higher dose and since last office visit with Dr. Acie Fredrickson in July she has been taking 200 mg every morning.  She states this dose works well.  The medication is listed with historical provider so I advised Angela [name] I will d/c this Rx and send a new Rx for Acebutolol 200 mg every morning #90.  She thanked me for the call.

## 2016-07-26 NOTE — Telephone Encounter (Signed)
New message    Pt calling to speak to the nurse per the pharmacist. No other info given. Please call.

## 2016-08-06 ENCOUNTER — Other Ambulatory Visit: Payer: Self-pay | Admitting: Cardiovascular Disease

## 2016-08-10 DIAGNOSIS — Z23 Encounter for immunization: Secondary | ICD-10-CM | POA: Diagnosis not present

## 2016-08-29 ENCOUNTER — Other Ambulatory Visit: Payer: Self-pay | Admitting: *Deleted

## 2016-08-29 DIAGNOSIS — Z79899 Other long term (current) drug therapy: Secondary | ICD-10-CM | POA: Diagnosis not present

## 2016-08-29 DIAGNOSIS — M2550 Pain in unspecified joint: Secondary | ICD-10-CM | POA: Diagnosis not present

## 2016-08-29 DIAGNOSIS — K3184 Gastroparesis: Secondary | ICD-10-CM | POA: Diagnosis not present

## 2016-08-29 DIAGNOSIS — M0579 Rheumatoid arthritis with rheumatoid factor of multiple sites without organ or systems involvement: Secondary | ICD-10-CM | POA: Diagnosis not present

## 2016-08-29 DIAGNOSIS — F329 Major depressive disorder, single episode, unspecified: Secondary | ICD-10-CM

## 2016-08-29 MED ORDER — ESCITALOPRAM OXALATE 5 MG PO TABS: 5.0000 mg | ORAL_TABLET | Freq: Every day | ORAL | 1 refills | Status: DC

## 2016-09-21 ENCOUNTER — Ambulatory Visit
Admission: RE | Admit: 2016-09-21 | Discharge: 2016-09-21 | Disposition: A | Discharge status: Home / Self Care | Payer: Medicare Other | Source: Ambulatory Visit | Attending: Internal Medicine | Admitting: Internal Medicine

## 2016-09-21 DIAGNOSIS — Z1231 Encounter for screening mammogram for malignant neoplasm of breast: Secondary | ICD-10-CM

## 2016-10-04 ENCOUNTER — Encounter: Payer: Medicare Other | Attending: Registered Nurse | Admitting: Registered Nurse

## 2016-10-04 ENCOUNTER — Encounter: Payer: Self-pay | Admitting: Registered Nurse

## 2016-10-04 ENCOUNTER — Encounter (INDEPENDENT_AMBULATORY_CARE_PROVIDER_SITE_OTHER): Payer: Self-pay

## 2016-10-04 VITALS — BP 108/75 | HR 95

## 2016-10-04 DIAGNOSIS — M25511 Pain in right shoulder: Secondary | ICD-10-CM | POA: Insufficient documentation

## 2016-10-04 DIAGNOSIS — M545 Low back pain: Secondary | ICD-10-CM | POA: Insufficient documentation

## 2016-10-04 DIAGNOSIS — I6523 Occlusion and stenosis of bilateral carotid arteries: Secondary | ICD-10-CM

## 2016-10-04 DIAGNOSIS — Z79899 Other long term (current) drug therapy: Secondary | ICD-10-CM

## 2016-10-04 DIAGNOSIS — M25522 Pain in left elbow: Secondary | ICD-10-CM | POA: Insufficient documentation

## 2016-10-04 DIAGNOSIS — M05712 Rheumatoid arthritis with rheumatoid factor of left shoulder without organ or systems involvement: Secondary | ICD-10-CM

## 2016-10-04 DIAGNOSIS — M25512 Pain in left shoulder: Secondary | ICD-10-CM | POA: Insufficient documentation

## 2016-10-04 DIAGNOSIS — M069 Rheumatoid arthritis, unspecified: Secondary | ICD-10-CM | POA: Insufficient documentation

## 2016-10-04 DIAGNOSIS — G894 Chronic pain syndrome: Secondary | ICD-10-CM

## 2016-10-04 DIAGNOSIS — Z760 Encounter for issue of repeat prescription: Secondary | ICD-10-CM | POA: Insufficient documentation

## 2016-10-04 DIAGNOSIS — R200 Anesthesia of skin: Secondary | ICD-10-CM | POA: Diagnosis not present

## 2016-10-04 DIAGNOSIS — G8929 Other chronic pain: Secondary | ICD-10-CM | POA: Insufficient documentation

## 2016-10-04 DIAGNOSIS — M4712 Other spondylosis with myelopathy, cervical region: Secondary | ICD-10-CM | POA: Diagnosis not present

## 2016-10-04 DIAGNOSIS — F419 Anxiety disorder, unspecified: Secondary | ICD-10-CM | POA: Diagnosis not present

## 2016-10-04 DIAGNOSIS — M05711 Rheumatoid arthritis with rheumatoid factor of right shoulder without organ or systems involvement: Secondary | ICD-10-CM

## 2016-10-04 DIAGNOSIS — R42 Dizziness and giddiness: Secondary | ICD-10-CM | POA: Insufficient documentation

## 2016-10-04 DIAGNOSIS — Z5181 Encounter for therapeutic drug level monitoring: Secondary | ICD-10-CM

## 2016-10-04 DIAGNOSIS — M4802 Spinal stenosis, cervical region: Secondary | ICD-10-CM | POA: Insufficient documentation

## 2016-10-04 DIAGNOSIS — F329 Major depressive disorder, single episode, unspecified: Secondary | ICD-10-CM

## 2016-10-04 DIAGNOSIS — M25521 Pain in right elbow: Secondary | ICD-10-CM | POA: Insufficient documentation

## 2016-10-04 DIAGNOSIS — M4316 Spondylolisthesis, lumbar region: Secondary | ICD-10-CM

## 2016-10-04 MED ORDER — OXYCODONE-ACETAMINOPHEN 7.5-325 MG PO TABS: 1.0000 | ORAL_TABLET | Freq: Four times a day (QID) | ORAL | 0 refills | Status: DC | PRN

## 2016-10-04 MED ORDER — DICLOFENAC SODIUM 1 % TD GEL: 2.0000 g | Freq: Two times a day (BID) | TRANSDERMAL | 5 refills | Status: DC | PRN

## 2016-10-04 MED ORDER — LIDOCAINE 5 % EX PTCH: 3.0000 | MEDICATED_PATCH | Freq: Two times a day (BID) | CUTANEOUS | 5 refills | Status: DC

## 2016-10-04 NOTE — Progress Notes (Signed)
Subjective:    Patient ID: Angela [name], female    DOB: [birth year], 80 y.o.   MRN: [id number]  HPI: [name] is a 80 year old female who returns for follow up for chronic pain and medication refill. She states her pain is located in her bilateral elbows, bilateral shoulders and  and lower back. She rates her pain 9. Her current exercise regime is walking with the walker in her home and walking to her mailbox 4 times a day when weather permits.   Pain Inventory Average Pain 9 Pain Right Now 9 My pain is constant  In the last 24 hours, has pain interfered with the following? General activity 3 Relation with others 4 Enjoyment of life 4 What TIME of day is your pain at its worst? evening,night Sleep (in general) Fair  Pain is worse with: walking, bending and standing Pain improves with: rest, heat/ice and medication Relief from Meds: 8  Mobility walk with assistance use a walker how many minutes can you walk? 1 ability to climb steps?  no do you drive?  no use a wheelchair Do you have any goals in this area?  yes  Function retired I need assistance with the following:  dressing, bathing, toileting, meal prep, household duties and shopping  Neuro/Psych bladder control problems bowel control problems numbness tingling trouble walking dizziness depression anxiety  Prior Studies Any changes since last visit?  no  Physicians involved in your care Any changes since last visit?  no   Family History  Problem Relation Age of Onset  . Lung cancer Mother   . Arrhythmia Mother   . Melanoma Brother   . Heart attack Neg Hx    Social History   Social History  . Marital status: Widowed    Spouse name: N/A  . Number of children: N/A  . Years of education: N/A   Social History Main Topics  . Smoking status: Never Smoker  . Smokeless tobacco: Never Used  . Alcohol use No  . Drug use: No  . Sexual activity: Not on file   Other Topics Concern  . Not  on file   Social History Narrative  . No narrative on file   Past Surgical History:  Procedure Laterality Date  . ANKLE FUSION Bilateral    x2 both  . CARDIAC CATHETERIZATION  09/2012   patent LAD stent and otherwise normal coronary arteries  . CARDIAC CATHETERIZATION N/A 03/07/2016   Procedure: Left Heart Cath and Coronary Angiography;  Surgeon: Peter M Martinique, MD;  Location: [facility];  Service: Cardiovascular;  Laterality: N/A;  . CATARACT EXTRACTION, BILATERAL Bilateral   . CERVICAL FUSION     limited  ROM  . CHOLECYSTECTOMY    . ESOPHAGOGASTRODUODENOSCOPY N/A 01/07/2016   Procedure: ESOPHAGOGASTRODUODENOSCOPY (EGD);  Surgeon: Wilford Corner, MD;  Location: [facility];  Service: Endoscopy;  Laterality: N/A;  . ESOPHAGOGASTRODUODENOSCOPY (EGD) WITH PROPOFOL N/A 11/01/2015   Procedure: ESOPHAGOGASTRODUODENOSCOPY (EGD) WITH PROPOFOL w/ Dialtation;  Surgeon: Garlan Fair, MD;  Location: [facility];  Service: Endoscopy;  Laterality: N/A;  . FRACTURE SURGERY  09/2011   left ankle screw and pin removed  . HAND SURGERY Bilateral    x4 digits 2-5 both hands  . JOINT REPLACEMENT Bilateral    BTKA  . TONSILLECTOMY     Past Medical History:  Diagnosis Date  . A-fib ([place])   . Aortic stenosis    mild by echo 03/2012  . Asthma   .  CAD (coronary artery disease)    s/p PCI with BMS to mid LAD--2/08 not on aspirin due to frequent ulcers.  Repeat cath for CP 09/2012 with patent LAD stent and otherwise normal coronary arteries  . Cervical stenosis of spine   . Depression   . Dyslipidemia   . Elbow fracture, right    history of- never any surgery  . GERD (gastroesophageal reflux disease)   . Hypertension   . Impaired physical mobility    due to rheumatoid arthritis "ambulates with walker" limited free standing.  . Macular degeneration   . Obesity   . Osteoporosis    alendronate since 2012  . PUD (peptic ulcer disease)    can not take aspirin  . PVC's (premature  ventricular contractions)   . Rheumatoid arthritis(714.0)    Dr. Lenna Gilford follows  . Spondylolisthesis of lumbar region    There were no vitals taken for this visit.  Opioid Risk Score:   Fall Risk Score:  `1  Depression screen PHQ 2/9  Depression screen [facility] 2/9 07/04/2016 02/14/2015  Decreased Interest 0 2  Down, Depressed, Hopeless 1 2  PHQ - 2 Score 1 4  Altered sleeping - 2  Tired, decreased energy - 3  Change in appetite - 2  Feeling bad or failure about yourself  - 2  Trouble concentrating - 2  Moving slowly or fidgety/restless - 1  Suicidal thoughts - 0  PHQ-9 Score - 16   Review of Systems  Constitutional: Negative.   HENT: Negative.   Eyes: Negative.   Respiratory: Negative.   Cardiovascular: Negative.   Gastrointestinal: Positive for abdominal pain and constipation.  Endocrine: Negative.   Genitourinary: Positive for difficulty urinating.       Bowel control problems  Musculoskeletal: Positive for gait problem.  Skin: Negative.   Allergic/Immunologic: Negative.   Neurological: Positive for dizziness and numbness.  Hematological: Negative.   Psychiatric/Behavioral: Positive for dysphoric mood. The patient is nervous/anxious.   All other systems reviewed and are negative.      Objective:   Physical Exam  Constitutional: She is oriented to person, place, and time. She appears well-developed and well-nourished.  HENT:  Head: Normocephalic and atraumatic.  Neck: Normal range of motion. Neck supple.  Cardiovascular: Normal rate and regular rhythm.   Pulmonary/Chest: Effort normal and breath sounds normal.  Musculoskeletal:  Normal Muscle Bulk and Muscle Testing Reveals: Upper Extremities: Decreased ROM  On Right 45 Degrees and Left 90 Degrees and Muscle Strength 3/5 Back without spinal tenderness Lower Extremities: Full ROM and Muscle Strength 5/5 Arises from Table slowly using walker Narrow Based gait    Neurological: She is alert and oriented to person,  place, and time.  Skin: Skin is warm and dry.  Psychiatric: She has a normal mood and affect.  Nursing note and vitals reviewed.         Assessment & Plan:  1. History of cervical stenosis/spondylosis with myelopathy:  Refilled: Oxycodone 7.5/325mg  one tablet every 6 hours as needed for pain #120.  We will continue the opioid monitoring program, this consists of regular clinic visits, examinations, urine drug screen, pill counts as well as use of [place] Controlled Substance reporting System. 2. Rheumatoid arthritis. Uses the Lidocaine Patches and alternates with Voltaren gel. Continue to Monitor  3. Lumbar spondylolisthesis. Continue with exercise and heat therapy.  4. Reactive Depression: Continue Lexapro  20 minutes of face to face patient care time was spent during this visit. All questions were encouraged  and answered.   F/U in 3 months

## 2016-10-10 LAB — TOXASSURE SELECT,+ANTIDEPR,UR

## 2016-10-17 NOTE — Progress Notes (Signed)
Urine drug screen for this encounter is consistent for prescribed medication 

## 2016-11-21 ENCOUNTER — Ambulatory Visit: Payer: Medicare Other | Admitting: Cardiovascular Disease

## 2016-11-22 ENCOUNTER — Telehealth: Payer: Self-pay | Admitting: Nurse Practitioner

## 2016-11-22 ENCOUNTER — Other Ambulatory Visit: Payer: Self-pay | Admitting: Nurse Practitioner

## 2016-11-22 MED ORDER — APIXABAN 5 MG PO TABS: 5.0000 mg | ORAL_TABLET | Freq: Two times a day (BID) | ORAL | 6 refills | Status: DC

## 2016-11-22 NOTE — Telephone Encounter (Signed)
   Ms. Valera called today to report that she was out of eliquis.  She was due to see Dr. Acie Fredrickson yesterday but due to the snow, that appt was cancelled.  I reviewed her meds and labs.  Creat nl in July.  I refilled her eliquis 5mg  bid, #60, six refills.  She will f/u with Dr. Acie Fredrickson once appt can be rescheduled.  Caller verbalized understanding and was grateful for the call back.  Murray Hodgkins, NP 11/22/2016, 2:55 PM

## 2016-11-29 DIAGNOSIS — M0579 Rheumatoid arthritis with rheumatoid factor of multiple sites without organ or systems involvement: Secondary | ICD-10-CM | POA: Diagnosis not present

## 2016-11-29 DIAGNOSIS — K3184 Gastroparesis: Secondary | ICD-10-CM | POA: Diagnosis not present

## 2016-11-29 DIAGNOSIS — Z79899 Other long term (current) drug therapy: Secondary | ICD-10-CM | POA: Diagnosis not present

## 2016-11-29 DIAGNOSIS — Z6823 Body mass index (BMI) 23.0-23.9, adult: Secondary | ICD-10-CM | POA: Diagnosis not present

## 2016-11-29 DIAGNOSIS — M2550 Pain in unspecified joint: Secondary | ICD-10-CM | POA: Diagnosis not present

## 2016-12-16 ENCOUNTER — Other Ambulatory Visit: Payer: Self-pay | Admitting: Cardiovascular Disease

## 2016-12-16 DIAGNOSIS — R600 Localized edema: Secondary | ICD-10-CM

## 2016-12-24 ENCOUNTER — Encounter: Payer: Self-pay | Admitting: Physical Medicine & Rehabilitation

## 2016-12-24 ENCOUNTER — Encounter: Payer: Medicare Other | Attending: Registered Nurse | Admitting: Physical Medicine & Rehabilitation

## 2016-12-24 VITALS — BP 89/61 | HR 79 | Temp 98.0°F | Resp 14

## 2016-12-24 DIAGNOSIS — R200 Anesthesia of skin: Secondary | ICD-10-CM | POA: Diagnosis not present

## 2016-12-24 DIAGNOSIS — M25511 Pain in right shoulder: Secondary | ICD-10-CM | POA: Insufficient documentation

## 2016-12-24 DIAGNOSIS — M545 Low back pain: Secondary | ICD-10-CM | POA: Diagnosis not present

## 2016-12-24 DIAGNOSIS — Z760 Encounter for issue of repeat prescription: Secondary | ICD-10-CM | POA: Diagnosis not present

## 2016-12-24 DIAGNOSIS — M05711 Rheumatoid arthritis with rheumatoid factor of right shoulder without organ or systems involvement: Secondary | ICD-10-CM | POA: Diagnosis not present

## 2016-12-24 DIAGNOSIS — M25521 Pain in right elbow: Secondary | ICD-10-CM | POA: Insufficient documentation

## 2016-12-24 DIAGNOSIS — M4802 Spinal stenosis, cervical region: Secondary | ICD-10-CM | POA: Insufficient documentation

## 2016-12-24 DIAGNOSIS — F329 Major depressive disorder, single episode, unspecified: Secondary | ICD-10-CM | POA: Insufficient documentation

## 2016-12-24 DIAGNOSIS — G8929 Other chronic pain: Secondary | ICD-10-CM | POA: Diagnosis not present

## 2016-12-24 DIAGNOSIS — R42 Dizziness and giddiness: Secondary | ICD-10-CM | POA: Insufficient documentation

## 2016-12-24 DIAGNOSIS — M25522 Pain in left elbow: Secondary | ICD-10-CM | POA: Diagnosis not present

## 2016-12-24 DIAGNOSIS — M4712 Other spondylosis with myelopathy, cervical region: Secondary | ICD-10-CM | POA: Insufficient documentation

## 2016-12-24 DIAGNOSIS — M4316 Spondylolisthesis, lumbar region: Secondary | ICD-10-CM | POA: Insufficient documentation

## 2016-12-24 DIAGNOSIS — M25512 Pain in left shoulder: Secondary | ICD-10-CM | POA: Insufficient documentation

## 2016-12-24 DIAGNOSIS — M05712 Rheumatoid arthritis with rheumatoid factor of left shoulder without organ or systems involvement: Secondary | ICD-10-CM

## 2016-12-24 DIAGNOSIS — F419 Anxiety disorder, unspecified: Secondary | ICD-10-CM | POA: Insufficient documentation

## 2016-12-24 DIAGNOSIS — M069 Rheumatoid arthritis, unspecified: Secondary | ICD-10-CM | POA: Insufficient documentation

## 2016-12-24 MED ORDER — OXYCODONE-ACETAMINOPHEN 7.5-325 MG PO TABS: 1.0000 | ORAL_TABLET | Freq: Four times a day (QID) | ORAL | 0 refills | Status: DC | PRN

## 2016-12-24 NOTE — Progress Notes (Signed)
Subjective:    Patient ID: Angela [name], female    DOB: 1936/07/12, 81 y.o.   MRN: [id number]  HPI   Angela [name] is here in follow up of her chronic pain. She has been doing well for the most part. She dealt with a recent GI virus but seems to have gotten over that.   She uses the lidoderm patches and voltaren gel liberally for her shoulder. Her percocet usage has decrease for the most part but she still takes them for severe pain.   Angela [name] has a caregiver with her during the day. The caregiver brought her to the office today.   She has some concerns about LE edema, left more than right. She set up a cardiology appt later this month. She denies SOB but has had some intermittent sternal discomfort.   Pain Inventory Average Pain 5 Pain Right Now 8 My pain is burning, dull and aching  In the last 24 hours, has pain interfered with the following? General activity 8 Relation with others 8 Enjoyment of life 7 What TIME of day is your pain at its worst? morning, evening Sleep (in general) Fair  Pain is worse with: walking, bending and some activites Pain improves with: heat/ice, pacing activities and medication Relief from Meds: 8  Mobility walk with assistance use a walker ability to climb steps?  no do you drive?  no use a wheelchair  Function disabled: date disabled n/a retired  Neuro/Psych bladder control problems weakness tingling trouble walking dizziness depression anxiety  Prior Studies Any changes since last visit?  yes  Physicians involved in your care Any changes since last visit?  no   Family History  Problem Relation Age of Onset  . Lung cancer Mother   . Arrhythmia Mother   . Melanoma Brother   . Heart attack Neg Hx    Social History   Social History  . Marital status: Widowed    Spouse name: N/A  . Number of children: N/A  . Years of education: N/A   Social History Main Topics  . Smoking status: Never Smoker  . Smokeless  tobacco: Never Used  . Alcohol use No  . Drug use: No  . Sexual activity: Not Asked   Other Topics Concern  . None   Social History Narrative  . None   Past Surgical History:  Procedure Laterality Date  . ANKLE FUSION Bilateral    x2 both  . CARDIAC CATHETERIZATION  09/2012   patent LAD stent and otherwise normal coronary arteries  . CARDIAC CATHETERIZATION N/A 03/07/2016   Procedure: Left Heart Cath and Coronary Angiography;  Surgeon: Peter M Martinique, MD;  Location: [facility];  Service: Cardiovascular;  Laterality: N/A;  . CATARACT EXTRACTION, BILATERAL Bilateral   . CERVICAL FUSION     limited  ROM  . CHOLECYSTECTOMY    . ESOPHAGOGASTRODUODENOSCOPY N/A 01/07/2016   Procedure: ESOPHAGOGASTRODUODENOSCOPY (EGD);  Surgeon: Wilford Corner, MD;  Location: [facility];  Service: Endoscopy;  Laterality: N/A;  . ESOPHAGOGASTRODUODENOSCOPY (EGD) WITH PROPOFOL N/A 11/01/2015   Procedure: ESOPHAGOGASTRODUODENOSCOPY (EGD) WITH PROPOFOL w/ Dialtation;  Surgeon: Garlan Fair, MD;  Location: [facility];  Service: Endoscopy;  Laterality: N/A;  . FRACTURE SURGERY  09/2011   left ankle screw and pin removed  . HAND SURGERY Bilateral    x4 digits 2-5 both hands  . JOINT REPLACEMENT Bilateral    BTKA  . TONSILLECTOMY     Past Medical History:  Diagnosis Date  .  A-fib ([place])   . Aortic stenosis    mild by echo 03/2012  . Asthma   . CAD (coronary artery disease)    s/p PCI with BMS to mid LAD--2/08 not on aspirin due to frequent ulcers.  Repeat cath for CP 09/2012 with patent LAD stent and otherwise normal coronary arteries  . Cervical stenosis of spine   . Depression   . Dyslipidemia   . Elbow fracture, right    history of- never any surgery  . GERD (gastroesophageal reflux disease)   . Hypertension   . Impaired physical mobility    due to rheumatoid arthritis "ambulates with walker" limited free standing.  . Macular degeneration   . Obesity   . Osteoporosis     alendronate since 2012  . PUD (peptic ulcer disease)    can not take aspirin  . PVC's (premature ventricular contractions)   . Rheumatoid arthritis(714.0)    Dr. Lenna Gilford follows  . Spondylolisthesis of lumbar region    BP (!) 89/61   Pulse 79   Temp 98 F (36.7 C) (Oral)   Resp 14   SpO2 98%   Opioid Risk Score:   Fall Risk Score:  `1  Depression screen PHQ 2/9  Depression screen [facility] 2/9 07/04/2016 02/14/2015  Decreased Interest 0 2  Down, Depressed, Hopeless 1 2  PHQ - 2 Score 1 4  Altered sleeping - 2  Tired, decreased energy - 3  Change in appetite - 2  Feeling bad or failure about yourself  - 2  Trouble concentrating - 2  Moving slowly or fidgety/restless - 1  Suicidal thoughts - 0  PHQ-9 Score - 16     Review of Systems  Constitutional: Positive for appetite change.       Weight loss  HENT: Negative.   Eyes: Negative.   Respiratory: Negative.   Cardiovascular: Negative.   Gastrointestinal: Positive for diarrhea and vomiting.  Endocrine: Negative.   Genitourinary: Negative.   Musculoskeletal:       Limb swewlling  Skin: Negative.   Allergic/Immunologic: Negative.   Neurological: Negative.   Hematological: Bruises/bleeds easily.  Psychiatric/Behavioral: Negative.   All other systems reviewed and are negative.      Objective:   Physical Exam  She does seem to have lost some weight..  She ambulated with the rolling walker with good form.  Neurological: She has normal reflexes. No cranial nerve deficit or sensory deficit.  Motor function appears at baseline. Balance improved  Skin: vasculitic lesions.  M/S: low back tender to palpation. Needs extra time to stand. Posture is head forward, kyphotic. LE edema trace RLE and 1+ LLE. Bilateral shoulders with limited ROM. Psychiatric: She has a normal mood and affect. Her behavior is normal. Judgment and thought content normal.    Assessment & Plan:  ASSESSMENT:  1. History of cervical  stenosis/spondylosis with myelopathy.  2. Rheumatoid arthritis.  3. Lumbar spondylolisthesis.  4. Gait disorder related to the above, 5. LE edema.     PLAN:  1. She was given one refill prescription for Percocet 7.5/325 1 p.o. q.6  hours p.r.n., 120.  2. Continue with lidoderm and voltaren gel.  3. Low residue diet/liquids for Pyloric polyp.  4. Recommended velcro compression stockings for legs. A picture was provided. TEDS are too difficult to don./doff. She will follow up with cardiology as well.  Follow up in 3 months.  15 minutes of face to face patient care time were spent during this visit. All questions were  encouraged and answered.

## 2016-12-24 NOTE — Patient Instructions (Addendum)
PLEASE FEEL FREE TO CALL OUR OFFICE WITH ANY PROBLEMS OR QUESTIONS [phone])   Velcro compression stocking

## 2017-01-17 ENCOUNTER — Encounter: Payer: Self-pay | Admitting: Cardiovascular Disease

## 2017-01-17 ENCOUNTER — Encounter (INDEPENDENT_AMBULATORY_CARE_PROVIDER_SITE_OTHER): Payer: Self-pay

## 2017-01-17 ENCOUNTER — Ambulatory Visit (INDEPENDENT_AMBULATORY_CARE_PROVIDER_SITE_OTHER): Payer: Medicare Other | Admitting: Cardiovascular Disease

## 2017-01-17 VITALS — BP 90/64 | HR 96 | Ht 62.0 in | Wt 113.1 lb

## 2017-01-17 DIAGNOSIS — I480 Paroxysmal atrial fibrillation: Secondary | ICD-10-CM | POA: Diagnosis not present

## 2017-01-17 DIAGNOSIS — I350 Nonrheumatic aortic (valve) stenosis: Secondary | ICD-10-CM

## 2017-01-17 MED ORDER — DIGOXIN 125 MCG PO TABS: 0.1250 mg | ORAL_TABLET | Freq: Every day | ORAL | 3 refills | Status: DC

## 2017-01-17 MED ORDER — ACEBUTOLOL HCL 200 MG PO CAPS: 200.0000 mg | ORAL_CAPSULE | ORAL | 3 refills | Status: DC

## 2017-01-17 MED ORDER — APIXABAN 5 MG PO TABS: 5.0000 mg | ORAL_TABLET | Freq: Two times a day (BID) | ORAL | 3 refills | Status: DC

## 2017-01-17 NOTE — Patient Instructions (Signed)
Medication Instructions:  START Digoxin (Lanoxin) 0.125 mg once daily   Labwork: None Ordered   Testing/Procedures: None Ordered   Follow-Up: Your physician wants you to follow-up in: 6 months with Dr. Acie Fredrickson.  You will receive a reminder letter in the mail two months in advance. If you don't receive a letter, please call our office to schedule the follow-up appointment.   If you need a refill on your cardiac medications before your next appointment, please call your pharmacy.   Thank you for choosing [facility]! Christen Bame, RN [phone]

## 2017-01-17 NOTE — Progress Notes (Signed)
Cardiology Office Note:    Date:  01/17/2017   ID:  Angela [name], DOB Sep 15, 1936, MRN [id number]  PCP:  Irven Shelling, MD  Cardiologist:  Dr. Fransico Him  >> Patient requests FU with Dr. [name]  Electrophysiologist:  n/a  No chief complaint on file.  Problem List 1. Paroxysmal atrial fib 2. Atrial fib  3. Rheumatiod arthritis  4. Hx of gastric outlet obstruction  5. Mild pulmonary HTN  - never smoked  6. Aortic stenosis  - moderate AS     History of Present Illness:     Angela [name] is a 81 y.o. female with a hx of CAD, s/p prior PCI to the LAD, HL, HTN, PVCs, rheumatoid arthritis, peptic ulcer disease, aortic stenosis.  She was seen for bradycardia and Holter in 8/15 showed NSR with PACs and PVCs and ATach.  Toprol was changed to Acebutolol.  Bradycardia resolved.  Last seen by Dr. Fransico Him in 4/16.  Prior echo suggested mod AS.  Repeat limited echo demonstrated just mild AS.   I saw her in 2/17 with increased swelling in her feet.  I felt her LE edema was multifactorial and related to venous insufficiency, inactivity, diastolic dysfunction.  I asked her to take extra Furosemide prn, wear compression stockings and to keep her legs elevated.  Admitted 3/17 with gastric outlet obstruction noted on EGD.  She had an NG tube placed and underwent EGD demonstrating a polypoid lesion in the pyloric channel with surrounding ulcer and edema.  Bx was neg for malignancy.  Hospital course was c/b AFlutter with RVR and she was seen by Cardiology.  Echo demonstrated mod AS, mod TR, EF 45%, LAE.  Acebutolol was increased to 400 bid.  She converted to NSR.   She also had episodes of PSVT. She was given prn Propranolol to use.  CHADS2-VASc=6 (CHF, vascular dz, female, age, HTN).  She was placed on Apixaban.    Last seen by me 01/30/16.  She was in NSR.  I decreased her Acebutolol due to low BP.      Since her Myoview, she has had no further chest pain.  She feels fatigued and  dizzy at times.  Denies any significant change in her breathing.  Denies syncope, PND.  LE edema is chronic and stable.    Mar 21, 2016:  Angela [name] is seen today .   I saw her in the hospital for an episode of PAF that occurred during an admission for small bowel obstruction . Has had some dizziness.    Not eating well Has lost some weight - has lost 30-40 lbs over the past year.   Her acebutolol dose is 400 mg in the AM, 200 mg in the PM   May 25, 2016:  Had TIA like symptoms 2 days ago Blurry vision in the right eye. , no weakness in her arms and legs.   Was seen by EMS  She did not go to the hospital . HR is irregular  (has PVCs on monitor )  Has had some leg swelling .  Recommended leg elevation and compression hose  ( cant put on the compression hose due to the RA in her hands )   Had a cath in may,   Normal LV function, Patent LAD stent  Echo :  EF 45% , mild pulmonary HTN, mild MR   We decreased her Acebutolol to 200 mg a day and she feels better   January 17, 2017:  Has not felt that well over the past several months. Had a GI bug that lasted several weeks.  Has had some bilateral feet swelling .  Has to eat soft foods - lots of soup several times a week.  Feet have been swelling for a year or so   Past Medical History:  Diagnosis Date  . A-fib ([place])   . Aortic stenosis    mild by echo 03/2012  . Asthma   . CAD (coronary artery disease)    s/p PCI with BMS to mid LAD--2/08 not on aspirin due to frequent ulcers.  Repeat cath for CP 09/2012 with patent LAD stent and otherwise normal coronary arteries  . Cervical stenosis of spine   . Depression   . Dyslipidemia   . Elbow fracture, right    history of- never any surgery  . GERD (gastroesophageal reflux disease)   . Hypertension   . Impaired physical mobility    due to rheumatoid arthritis "ambulates with walker" limited free standing.  . Macular degeneration   . Obesity   . Osteoporosis    alendronate since  2012  . PUD (peptic ulcer disease)    can not take aspirin  . PVC's (premature ventricular contractions)   . Rheumatoid arthritis(714.0)    Dr. Lenna Gilford follows  . Spondylolisthesis of lumbar region     Past Surgical History:  Procedure Laterality Date  . ANKLE FUSION Bilateral    x2 both  . CARDIAC CATHETERIZATION  09/2012   patent LAD stent and otherwise normal coronary arteries  . CARDIAC CATHETERIZATION N/A 03/07/2016   Procedure: Left Heart Cath and Coronary Angiography;  Surgeon: Peter M Martinique, MD;  Location: [facility];  Service: Cardiovascular;  Laterality: N/A;  . CATARACT EXTRACTION, BILATERAL Bilateral   . CERVICAL FUSION     limited  ROM  . CHOLECYSTECTOMY    . ESOPHAGOGASTRODUODENOSCOPY N/A 01/07/2016   Procedure: ESOPHAGOGASTRODUODENOSCOPY (EGD);  Surgeon: Wilford Corner, MD;  Location: [facility];  Service: Endoscopy;  Laterality: N/A;  . ESOPHAGOGASTRODUODENOSCOPY (EGD) WITH PROPOFOL N/A 11/01/2015   Procedure: ESOPHAGOGASTRODUODENOSCOPY (EGD) WITH PROPOFOL w/ Dialtation;  Surgeon: Garlan Fair, MD;  Location: [facility];  Service: Endoscopy;  Laterality: N/A;  . FRACTURE SURGERY  09/2011   left ankle screw and pin removed  . HAND SURGERY Bilateral    x4 digits 2-5 both hands  . JOINT REPLACEMENT Bilateral    BTKA  . TONSILLECTOMY      Current Medications: Outpatient Medications Prior to Visit  Medication Sig Dispense Refill  . acebutolol (SECTRAL) 200 MG capsule Take 1 capsule (200 mg total) by mouth every morning. 90 capsule 3  . apixaban (ELIQUIS) 5 MG TABS tablet Take 1 tablet (5 mg total) by mouth 2 (two) times daily. 60 tablet 6  . butalbital-acetaminophen-caffeine (FIORICET, ESGIC) 50-325-40 MG per tablet Take 1 tablet by mouth every 6 (six) hours as needed for headache or migraine. For migraines    . Calcium Carbonate-Vitamin D (CALCIUM 500+D PO) Take 1 tablet by mouth 2 (two) times daily.     . DENTAGEL 1.1 % GEL dental gel Place 1  application onto teeth at bedtime.     . diclofenac sodium (VOLTAREN) 1 % GEL Apply 2 g topically 2 (two) times daily as needed (for pain). For pain 3 Tube 5  . Docusate Calcium (STOOL SOFTENER PO) Take 1 tablet by mouth at bedtime.    Marland Kitchen escitalopram (LEXAPRO) 5 MG tablet Take 1 tablet (5 mg total) by  mouth at bedtime. 90 tablet 1  . fluticasone (FLONASE) 50 MCG/ACT nasal spray Place 1 spray into both nostrils daily as needed for allergies.   0  . folic acid (FOLVITE) 1 MG tablet Take 1 mg by mouth daily.     . furosemide (LASIX) 40 MG tablet TAKE 1 TABLET EVERY DAY TAKE 1/2 TAB AS NEEDED FOR EXTRA SWELLING 45 tablet 4  . lidocaine (LIDODERM) 5 % Place 3 patches onto the skin every 12 (twelve) hours. APPLY NECK AND ELBOW 90 patch 5  . LORazepam (ATIVAN) 2 MG tablet Take 2 mg by mouth at bedtime.  5  . methotrexate (RHEUMATREX) 2.5 MG tablet Take 15 mg by mouth every Saturday.     . nitroGLYCERIN (NITROSTAT) 0.4 MG SL tablet Place 1 tablet (0.4 mg total) under the tongue every 5 (five) minutes as needed for chest pain. 25 tablet 1  . nystatin cream (MYCOSTATIN) Apply 1 application topically daily as needed for dry skin.    Marland Kitchen oxyCODONE-acetaminophen (PERCOCET) 7.5-325 MG tablet Take 1 tablet by mouth every 6 (six) hours as needed for moderate pain. 120 tablet 0  . pantoprazole (PROTONIX) 40 MG tablet Take 40 mg by mouth 2 (two) times daily.     . predniSONE (DELTASONE) 5 MG tablet Take 5 mg by mouth daily with breakfast.     . PREVIDENT 0.2 % SOLN Take 1 each by mouth at bedtime. Rinses with about 1 capful nightly    . propranolol (INDERAL) 10 MG tablet Take 1 tablet (10 mg total) by mouth daily as needed (palpitations lasting more than 5 minutes). 30 tablet 0  . simvastatin (ZOCOR) 40 MG tablet TAKE 1 TABLET BY MOUTH AT BEDTIME 90 tablet 3  . TOVIAZ 8 MG TB24 tablet Take 8 mg by mouth daily.    Marland Kitchen EPIPEN 2-PAK 0.3 MG/0.3ML SOAJ Inject 0.3 mg into the muscle daily as needed (for allergic reaction).       No facility-administered medications prior to visit.      Allergies:   Peanut-containing drug products; Reglan [metoclopramide]; Clindamycin/lincomycin; Codeine; Iohexol; Pineapple; and Shellfish allergy   Social History   Social History  . Marital status: Widowed    Spouse name: N/A  . Number of children: N/A  . Years of education: N/A   Social History Main Topics  . Smoking status: Never Smoker  . Smokeless tobacco: Never Used  . Alcohol use No  . Drug use: No  . Sexual activity: Not Asked   Other Topics Concern  . None   Social History Narrative  . None     Family History:  The patient's family history includes Arrhythmia in her mother; Lung cancer in her mother; Melanoma in her brother.   ROS:   Please see the history of present illness.    Review of Systems  Constitution: Positive for decreased appetite, malaise/fatigue and weight loss.  Cardiovascular: Positive for irregular heartbeat and leg swelling.  Hematologic/Lymphatic: Bruises/bleeds easily.  Musculoskeletal: Positive for back pain, joint pain, joint swelling and myalgias.  Gastrointestinal: Positive for constipation and diarrhea.  Neurological: Positive for dizziness and loss of balance.  Psychiatric/Behavioral: The patient is nervous/anxious.   All other systems reviewed and are negative.   Physical Exam:    VS:  BP 90/64 (BP Location: Right Arm, Patient Position: Sitting, Cuff Size: Normal)   Pulse 96   Ht 5' 2"  (1.575 m)   Wt 113 lb 1.9 oz (51.3 kg)   SpO2 98%   BMI  20.69 kg/m    GEN: chronically ill appearing , elderly female.    Neck: no JVD, no masses Cardiac: Irreg. Irreg. Tachycardic.   Normal J2/E2, ; 1/6 systolic murmur RUSB, trace- 1+ bilateral LE edema   Respiratory:  clear to auscultation bilaterally; no   rales GI: soft, nontender  MS: bilat hands with rheumatoid deformities (ulnar deviation, swan neck)  Skin: warm and dry   Neuro:  no focal deficits  Psych: Alert and  oriented x 3, normal affect  Wt Readings from Last 3 Encounters:  01/17/17 113 lb 1.9 oz (51.3 kg)  06/12/16 114 lb 14.4 oz (52.1 kg)  05/25/16 114 lb 9.6 oz (52 kg)      Studies/Labs Reviewed:     EKG:  EKG is  ordered today.  The ekg ordered today demonstrates Coarse atrial fibrillation with a heart rate of 105. She has nonspecific ST and T wave changes.   Recent Labs: 03/05/2016: Hemoglobin 13.5; Platelets 185 05/25/2016: BUN 14; Creat 0.60; Potassium 4.2; Sodium 135   Recent Lipid Panel    Component Value Date/Time   CHOL 129 02/24/2015 1030   TRIG 81 01/09/2016 0515   HDL 58.80 02/24/2015 1030   CHOLHDL 2 02/24/2015 1030   VLDL 17.6 02/24/2015 1030   LDLCALC 53 02/24/2015 1030    Additional studies/ records that were reviewed today include:   Myoview 02/29/16 EF 45%, inf AK, inferolateral scar with peri-infarct ischemia, intermediate to high risk study  Echo 01/14/16 EF 45%, diff HK, mod AS, mean 16 mmHg, mild MR, mod to severe LAE, mod TR, PASP 34 mmHg  Limited Echo 03/09/15 Mild AS, mean 13 mmHg, mod LAE  Echo 02/24/15 EF 55-60%, inf HK, Gr 2 DD, probable mod AS, mean 18 mmHg, MAC, mild MR, mild LAE, mild RAE, mild to mod TR, PASP 39 mmHg  Holter 8/15 NSR,PVCs, PACs, WCT (NSVT vs aberration)  LHC (11/13):  Proximal LAD stent patent, normal left main, circumflex and RCA, EF 50-55% with inferobasal akinesis, LVEDP 17 mm Hg  Echo (4/15):  Mild LVH, EF 60-65%, normal wall motion, grade 1 diastolic dysfunction, moderate aortic stenosis (mean 17 mm Hg), trivial AI, borderline dilated ascending aorta, severe LAE, mild RAE, mild TR, PASP 35 mm Hg  Nuclear (5/13):  Breast attenuation, no ischemia, EF 55%-low risk   ASSESSMENT:     No diagnosis found.  PLAN:     In order of problems listed above:  1. PAF/Flutter -  In NSR at [facility].  CHADS2-VASc=6.  Continue long term anticoagulation with Apixaban.  She's back in atrial fibrillation today. Her blood pressure  is too low to increase the dose of Acebutaolol.   We will start her on digoxin 0.125 mg a day. I'll see her back in 6 months.  2. SVT - No apparent recurrence.   3. Cardiomyopathy - EF lower on recent echo than prior studies.  Suspect this is from RVR.  Consider repeat Echo in 2-3 mos -   4. CAD - She is now off Plavix as she is on Apixaban.  Continue statin.    Stress myoview was abnormal  But her stent was patent and she had no minimal irreg.   5. Aortic stenosis - Mod by recent echo.      6. HTN -  BP has been low, this likely related to her poor by mouth intake. She's been having to eat very small meals because of a esophageal gastric obstruction due to a polyp near her sphincter.  7. Edema - Multifactorial. Recommended leg elevation  She cannot wear compression hose.    8. Possible TIA:    Will get carotid duplex scan  Will refer to neuro  She is on eliquis for her PAF    Mertie Moores, MD  01/17/2017 2:50 PM    [facility] [address],  [place], [place]  [address] Pager [phone] Phone: [phone]; Fax: [phone]

## 2017-02-05 DIAGNOSIS — R7301 Impaired fasting glucose: Secondary | ICD-10-CM | POA: Diagnosis not present

## 2017-02-05 DIAGNOSIS — I10 Essential (primary) hypertension: Secondary | ICD-10-CM | POA: Diagnosis not present

## 2017-02-05 DIAGNOSIS — Z1389 Encounter for screening for other disorder: Secondary | ICD-10-CM | POA: Diagnosis not present

## 2017-02-05 DIAGNOSIS — E7800 Pure hypercholesterolemia, unspecified: Secondary | ICD-10-CM | POA: Diagnosis not present

## 2017-02-05 DIAGNOSIS — K219 Gastro-esophageal reflux disease without esophagitis: Secondary | ICD-10-CM | POA: Diagnosis not present

## 2017-02-05 DIAGNOSIS — E46 Unspecified protein-calorie malnutrition: Secondary | ICD-10-CM | POA: Diagnosis not present

## 2017-02-05 DIAGNOSIS — E43 Unspecified severe protein-calorie malnutrition: Secondary | ICD-10-CM | POA: Diagnosis not present

## 2017-02-05 DIAGNOSIS — I482 Chronic atrial fibrillation: Secondary | ICD-10-CM | POA: Diagnosis not present

## 2017-02-05 DIAGNOSIS — F329 Major depressive disorder, single episode, unspecified: Secondary | ICD-10-CM | POA: Diagnosis not present

## 2017-02-05 DIAGNOSIS — Z0000 Encounter for general adult medical examination without abnormal findings: Secondary | ICD-10-CM | POA: Diagnosis not present

## 2017-02-05 DIAGNOSIS — K3184 Gastroparesis: Secondary | ICD-10-CM | POA: Diagnosis not present

## 2017-02-19 ENCOUNTER — Other Ambulatory Visit: Payer: Self-pay | Admitting: Physical Medicine & Rehabilitation

## 2017-02-19 DIAGNOSIS — F329 Major depressive disorder, single episode, unspecified: Secondary | ICD-10-CM

## 2017-02-28 DIAGNOSIS — M25511 Pain in right shoulder: Secondary | ICD-10-CM | POA: Diagnosis not present

## 2017-02-28 DIAGNOSIS — Z79899 Other long term (current) drug therapy: Secondary | ICD-10-CM | POA: Diagnosis not present

## 2017-02-28 DIAGNOSIS — Z681 Body mass index (BMI) 19 or less, adult: Secondary | ICD-10-CM | POA: Diagnosis not present

## 2017-02-28 DIAGNOSIS — Z6823 Body mass index (BMI) 23.0-23.9, adult: Secondary | ICD-10-CM | POA: Diagnosis not present

## 2017-02-28 DIAGNOSIS — M0579 Rheumatoid arthritis with rheumatoid factor of multiple sites without organ or systems involvement: Secondary | ICD-10-CM | POA: Diagnosis not present

## 2017-02-28 DIAGNOSIS — K3184 Gastroparesis: Secondary | ICD-10-CM | POA: Diagnosis not present

## 2017-02-28 DIAGNOSIS — G8929 Other chronic pain: Secondary | ICD-10-CM | POA: Diagnosis not present

## 2017-02-28 DIAGNOSIS — M2550 Pain in unspecified joint: Secondary | ICD-10-CM | POA: Diagnosis not present

## 2017-02-28 DIAGNOSIS — M21921 Unspecified acquired deformity of right upper arm: Secondary | ICD-10-CM | POA: Diagnosis not present

## 2017-03-17 ENCOUNTER — Other Ambulatory Visit: Payer: Self-pay | Admitting: Cardiology

## 2017-03-21 ENCOUNTER — Telehealth: Payer: Self-pay | Admitting: Registered Nurse

## 2017-03-21 NOTE — Telephone Encounter (Signed)
On 03/21/2017 the [place] was reviewed no conflict was seen on the Experiment with multiple prescribers. Angela [name] has a signed narcotic contract with our office. If there were any discrepancies this would have been reported to her physician.

## 2017-03-25 ENCOUNTER — Encounter: Payer: Medicare Other | Attending: Registered Nurse | Admitting: Registered Nurse

## 2017-03-25 ENCOUNTER — Encounter: Payer: Self-pay | Admitting: Registered Nurse

## 2017-03-25 VITALS — BP 96/66 | HR 80

## 2017-03-25 DIAGNOSIS — Z760 Encounter for issue of repeat prescription: Secondary | ICD-10-CM | POA: Diagnosis not present

## 2017-03-25 DIAGNOSIS — M25521 Pain in right elbow: Secondary | ICD-10-CM | POA: Insufficient documentation

## 2017-03-25 DIAGNOSIS — M25512 Pain in left shoulder: Secondary | ICD-10-CM | POA: Insufficient documentation

## 2017-03-25 DIAGNOSIS — M4316 Spondylolisthesis, lumbar region: Secondary | ICD-10-CM | POA: Diagnosis not present

## 2017-03-25 DIAGNOSIS — F419 Anxiety disorder, unspecified: Secondary | ICD-10-CM | POA: Insufficient documentation

## 2017-03-25 DIAGNOSIS — M069 Rheumatoid arthritis, unspecified: Secondary | ICD-10-CM | POA: Diagnosis not present

## 2017-03-25 DIAGNOSIS — M05711 Rheumatoid arthritis with rheumatoid factor of right shoulder without organ or systems involvement: Secondary | ICD-10-CM | POA: Diagnosis not present

## 2017-03-25 DIAGNOSIS — M25511 Pain in right shoulder: Secondary | ICD-10-CM | POA: Diagnosis not present

## 2017-03-25 DIAGNOSIS — M545 Low back pain: Secondary | ICD-10-CM | POA: Diagnosis not present

## 2017-03-25 DIAGNOSIS — M05712 Rheumatoid arthritis with rheumatoid factor of left shoulder without organ or systems involvement: Secondary | ICD-10-CM | POA: Diagnosis not present

## 2017-03-25 DIAGNOSIS — R42 Dizziness and giddiness: Secondary | ICD-10-CM | POA: Insufficient documentation

## 2017-03-25 DIAGNOSIS — G8929 Other chronic pain: Secondary | ICD-10-CM | POA: Diagnosis not present

## 2017-03-25 DIAGNOSIS — R2681 Unsteadiness on feet: Secondary | ICD-10-CM

## 2017-03-25 DIAGNOSIS — R5381 Other malaise: Secondary | ICD-10-CM | POA: Diagnosis not present

## 2017-03-25 DIAGNOSIS — F329 Major depressive disorder, single episode, unspecified: Secondary | ICD-10-CM | POA: Insufficient documentation

## 2017-03-25 DIAGNOSIS — G894 Chronic pain syndrome: Secondary | ICD-10-CM

## 2017-03-25 DIAGNOSIS — R200 Anesthesia of skin: Secondary | ICD-10-CM | POA: Diagnosis not present

## 2017-03-25 DIAGNOSIS — M4712 Other spondylosis with myelopathy, cervical region: Secondary | ICD-10-CM | POA: Diagnosis not present

## 2017-03-25 DIAGNOSIS — M4802 Spinal stenosis, cervical region: Secondary | ICD-10-CM | POA: Insufficient documentation

## 2017-03-25 DIAGNOSIS — M25522 Pain in left elbow: Secondary | ICD-10-CM | POA: Diagnosis not present

## 2017-03-25 MED ORDER — OXYCODONE-ACETAMINOPHEN 7.5-325 MG PO TABS: 1.0000 | ORAL_TABLET | Freq: Four times a day (QID) | ORAL | 0 refills | Status: DC | PRN

## 2017-03-25 NOTE — Progress Notes (Signed)
Subjective:    Patient ID: Angela [name], female    DOB: 1936-08-28, 81 y.o.   MRN: [id number]  HPI:  [name] is a 81 year old female who returns for follow up appointment for chronic pain and medication refill. She states her pain is located in her bilateral elbows, bilateral shoulders R>L and  and lower back. She rates her pain 7. Ms. Grippi uses her lidoderm patches and voltaren gel for her right shoulder. Also uses the Percocet sparingly ( her current prescription was picked up on 12/24/2016) . Her current exercise regime is walking with the walker in her home.  [name] experiencing physical weakness/ debilitation. Will place a referral for home physical therapy with [facility]. [name] is homebound.   [name] caregiver brought her to the office today.   Last UDS was performed on 10/04/16, it was consistent. UDS ordered today.    Pain Inventory Average Pain 5 Pain Right Now 7 My pain is sharp, burning and dull  In the last 24 hours, has pain interfered with the following? General activity 7 Relation with others 7 Enjoyment of life 7 What TIME of day is your pain at its worst? evening Sleep (in general) Good  Pain is worse with: walking, bending, standing and some activites Pain improves with: rest, heat/ice, medication and TENS Relief from Meds: 9  Mobility use a walker how many minutes can you walk? 5 ability to climb steps?  no do you drive?  no  Function disabled: date disabled . retired I need assistance with the following:  bathing  Neuro/Psych bladder control problems numbness trouble walking dizziness anxiety  Prior Studies Any changes since last visit?  no  Physicians involved in your care Any changes since last visit?  no   Family History  Problem Relation Age of Onset  . Lung cancer Mother   . Arrhythmia Mother   . Melanoma Brother   . Heart attack Neg Hx    Social History   Social History  . Marital status: Widowed    Spouse name: N/A  . Number of children: N/A  . Years of education: N/A   Social History Main Topics  . Smoking status: Never Smoker  . Smokeless tobacco: Never Used  . Alcohol use No  . Drug use: No  . Sexual activity: Not Asked   Other Topics Concern  . None   Social History Narrative  . None   Past Surgical History:  Procedure Laterality Date  . ANKLE FUSION Bilateral    x2 both  . CARDIAC CATHETERIZATION  09/2012   patent LAD stent and otherwise normal coronary arteries  . CARDIAC CATHETERIZATION N/A 03/07/2016   Procedure: Left Heart Cath and Coronary Angiography;  Surgeon: Peter M Martinique, MD;  Location: [facility];  Service: Cardiovascular;  Laterality: N/A;  . CATARACT EXTRACTION, BILATERAL Bilateral   . CERVICAL FUSION     limited  ROM  . CHOLECYSTECTOMY    . ESOPHAGOGASTRODUODENOSCOPY N/A 01/07/2016   Procedure: ESOPHAGOGASTRODUODENOSCOPY (EGD);  Surgeon: Wilford Corner, MD;  Location: [facility];  Service: Endoscopy;  Laterality: N/A;  . ESOPHAGOGASTRODUODENOSCOPY (EGD) WITH PROPOFOL N/A 11/01/2015   Procedure: ESOPHAGOGASTRODUODENOSCOPY (EGD) WITH PROPOFOL w/ Dialtation;  Surgeon: Garlan Fair, MD;  Location: [facility];  Service: Endoscopy;  Laterality: N/A;  . FRACTURE SURGERY  09/2011   left ankle screw and pin removed  . HAND SURGERY Bilateral    x4 digits 2-5 both hands  . JOINT  REPLACEMENT Bilateral    BTKA  . TONSILLECTOMY     Past Medical History:  Diagnosis Date  . A-fib ([place])   . Aortic stenosis    mild by echo 03/2012  . Asthma   . CAD (coronary artery disease)    s/p PCI with BMS to mid LAD--2/08 not on aspirin due to frequent ulcers.  Repeat cath for CP 09/2012 with patent LAD stent and otherwise normal coronary arteries  . Cervical stenosis of spine   . Depression   . Dyslipidemia   . Elbow fracture, right    history of- never any surgery  . GERD (gastroesophageal reflux disease)   . Hypertension   . Impaired physical  mobility    due to rheumatoid arthritis "ambulates with walker" limited free standing.  . Macular degeneration   . Obesity   . Osteoporosis    alendronate since 2012  . PUD (peptic ulcer disease)    can not take aspirin  . PVC's (premature ventricular contractions)   . Rheumatoid arthritis(714.0)    Dr. Lenna Gilford follows  . Spondylolisthesis of lumbar region    BP 96/66 (BP Location: Left Arm, Patient Position: Sitting, Cuff Size: Normal)   Pulse 80 Comment: apical  Opioid Risk Score:   Fall Risk Score:  `1  Depression screen PHQ 2/9  Depression screen [facility] 2/9 07/04/2016 02/14/2015  Decreased Interest 0 2  Down, Depressed, Hopeless 1 2  PHQ - 2 Score 1 4  Altered sleeping - 2  Tired, decreased energy - 3  Change in appetite - 2  Feeling bad or failure about yourself  - 2  Trouble concentrating - 2  Moving slowly or fidgety/restless - 1  Suicidal thoughts - 0  PHQ-9 Score - 16     Review of Systems  HENT: Negative.   Eyes: Negative.   Respiratory: Negative.   Cardiovascular: Positive for leg swelling.  Gastrointestinal: Negative.   Endocrine: Negative.   Genitourinary: Positive for difficulty urinating.  Musculoskeletal: Positive for gait problem.  Skin: Negative.   Allergic/Immunologic: Negative.   Neurological: Positive for dizziness and numbness.  Hematological: Bruises/bleeds easily.  Psychiatric/Behavioral: The patient is nervous/anxious.   All other systems reviewed and are negative.      Objective:   Physical Exam  Constitutional: She is oriented to person, place, and time. She appears well-developed and well-nourished.  HENT:  Head: Normocephalic and atraumatic.  Neck: Normal range of motion. Neck supple.  Cardiovascular: Normal rate and regular rhythm.   Pulmonary/Chest: Effort normal and breath sounds normal.  Musculoskeletal:  Normal Muscle Bulk and Muscle Testing Reveals: Upper Extremities: Decreased ROM 45 Degrees and Muscle Strength 3/5 Lower  Extremities: Full ROM and Muscle Strength 3/5 Arises from Table Slowly using walker for support Narrow Based Gait  Neurological: She is alert and oriented to person, place, and time.  Skin: Skin is warm and dry.  Psychiatric: She has a normal mood and affect.  Nursing note and vitals reviewed.         Assessment & Plan:  1. History of cervical stenosis/spondylosis with myelopathy: 03/25/2017 Refilled: Oxycodone 7.5/325mg  one tablet every 6 hours as needed for pain #120.  We will continue the opioid monitoring program, this consists of regular clinic visits, examinations, urine drug screen, pill counts as well as use of [place] Controlled Substance reporting System. 2. Rheumatoid arthritis. Uses the Lidocaine Patches and alternates with Voltaren gel. Continue to Monitor. 03/25/2017 3. Lumbar spondylolisthesis. Continue with exercise and heat therapy. 03/25/2017 4.  Reactive Depression: Continue Lexapro. 03/25/2017  20 minutes of face to face patient care time was spent during this visit. All questions were encouraged and answered.   F/U in 3 months

## 2017-03-28 LAB — TOXASSURE SELECT,+ANTIDEPR,UR

## 2017-04-02 ENCOUNTER — Telehealth: Payer: Self-pay | Admitting: *Deleted

## 2017-04-02 NOTE — Telephone Encounter (Signed)
Urine drug screen for this encounter is consistent for prescribed medication. Last dose of oxycodone was 03/22/17 and the test was done on 03/25/17.

## 2017-04-11 ENCOUNTER — Telehealth: Payer: Self-pay

## 2017-04-11 DIAGNOSIS — R2681 Unsteadiness on feet: Secondary | ICD-10-CM

## 2017-04-11 DIAGNOSIS — R5381 Other malaise: Secondary | ICD-10-CM

## 2017-04-11 NOTE — Telephone Encounter (Signed)
Patient has stated she hasn't heard from [facility] since 04/02/2017. She's following up with NP and stated she would like a call back from NP at her earliest convenience.

## 2017-04-12 NOTE — Telephone Encounter (Signed)
Return Angela [name] call, she states [facility] came out  for an initial visit, and she has never seen anyone. She has tried calling them, no one has return her call. She would like referral to be placed with [facility]. Referral placed to [place]. She verbalizes understanding. She was instructed to call office back, if she doesn't receive a call in a week. She verbalizes understanding.

## 2017-04-20 DIAGNOSIS — M810 Age-related osteoporosis without current pathological fracture: Secondary | ICD-10-CM | POA: Diagnosis not present

## 2017-04-20 DIAGNOSIS — I2510 Atherosclerotic heart disease of native coronary artery without angina pectoris: Secondary | ICD-10-CM | POA: Diagnosis not present

## 2017-04-20 DIAGNOSIS — I10 Essential (primary) hypertension: Secondary | ICD-10-CM | POA: Diagnosis not present

## 2017-04-20 DIAGNOSIS — M069 Rheumatoid arthritis, unspecified: Secondary | ICD-10-CM | POA: Diagnosis not present

## 2017-04-20 DIAGNOSIS — F329 Major depressive disorder, single episode, unspecified: Secondary | ICD-10-CM | POA: Diagnosis not present

## 2017-04-20 DIAGNOSIS — Z79891 Long term (current) use of opiate analgesic: Secondary | ICD-10-CM | POA: Diagnosis not present

## 2017-04-20 DIAGNOSIS — J45909 Unspecified asthma, uncomplicated: Secondary | ICD-10-CM | POA: Diagnosis not present

## 2017-04-20 DIAGNOSIS — K219 Gastro-esophageal reflux disease without esophagitis: Secondary | ICD-10-CM | POA: Diagnosis not present

## 2017-04-20 DIAGNOSIS — Z7952 Long term (current) use of systemic steroids: Secondary | ICD-10-CM | POA: Diagnosis not present

## 2017-04-20 DIAGNOSIS — M4316 Spondylolisthesis, lumbar region: Secondary | ICD-10-CM | POA: Diagnosis not present

## 2017-04-20 DIAGNOSIS — E669 Obesity, unspecified: Secondary | ICD-10-CM | POA: Diagnosis not present

## 2017-04-20 DIAGNOSIS — Z7901 Long term (current) use of anticoagulants: Secondary | ICD-10-CM | POA: Diagnosis not present

## 2017-04-20 DIAGNOSIS — E785 Hyperlipidemia, unspecified: Secondary | ICD-10-CM | POA: Diagnosis not present

## 2017-04-20 DIAGNOSIS — I4891 Unspecified atrial fibrillation: Secondary | ICD-10-CM | POA: Diagnosis not present

## 2017-04-24 DIAGNOSIS — I4891 Unspecified atrial fibrillation: Secondary | ICD-10-CM | POA: Diagnosis not present

## 2017-04-24 DIAGNOSIS — M4316 Spondylolisthesis, lumbar region: Secondary | ICD-10-CM | POA: Diagnosis not present

## 2017-04-24 DIAGNOSIS — M810 Age-related osteoporosis without current pathological fracture: Secondary | ICD-10-CM | POA: Diagnosis not present

## 2017-04-24 DIAGNOSIS — I2510 Atherosclerotic heart disease of native coronary artery without angina pectoris: Secondary | ICD-10-CM | POA: Diagnosis not present

## 2017-04-24 DIAGNOSIS — M069 Rheumatoid arthritis, unspecified: Secondary | ICD-10-CM | POA: Diagnosis not present

## 2017-04-24 DIAGNOSIS — I10 Essential (primary) hypertension: Secondary | ICD-10-CM | POA: Diagnosis not present

## 2017-04-26 DIAGNOSIS — M810 Age-related osteoporosis without current pathological fracture: Secondary | ICD-10-CM | POA: Diagnosis not present

## 2017-04-26 DIAGNOSIS — I4891 Unspecified atrial fibrillation: Secondary | ICD-10-CM | POA: Diagnosis not present

## 2017-04-26 DIAGNOSIS — I10 Essential (primary) hypertension: Secondary | ICD-10-CM | POA: Diagnosis not present

## 2017-04-26 DIAGNOSIS — I2510 Atherosclerotic heart disease of native coronary artery without angina pectoris: Secondary | ICD-10-CM | POA: Diagnosis not present

## 2017-04-26 DIAGNOSIS — M069 Rheumatoid arthritis, unspecified: Secondary | ICD-10-CM | POA: Diagnosis not present

## 2017-04-26 DIAGNOSIS — M4316 Spondylolisthesis, lumbar region: Secondary | ICD-10-CM | POA: Diagnosis not present

## 2017-04-29 DIAGNOSIS — M4316 Spondylolisthesis, lumbar region: Secondary | ICD-10-CM | POA: Diagnosis not present

## 2017-04-29 DIAGNOSIS — I4891 Unspecified atrial fibrillation: Secondary | ICD-10-CM | POA: Diagnosis not present

## 2017-04-29 DIAGNOSIS — M069 Rheumatoid arthritis, unspecified: Secondary | ICD-10-CM | POA: Diagnosis not present

## 2017-04-29 DIAGNOSIS — I2510 Atherosclerotic heart disease of native coronary artery without angina pectoris: Secondary | ICD-10-CM | POA: Diagnosis not present

## 2017-04-29 DIAGNOSIS — I10 Essential (primary) hypertension: Secondary | ICD-10-CM | POA: Diagnosis not present

## 2017-04-29 DIAGNOSIS — M810 Age-related osteoporosis without current pathological fracture: Secondary | ICD-10-CM | POA: Diagnosis not present

## 2017-05-06 DIAGNOSIS — M069 Rheumatoid arthritis, unspecified: Secondary | ICD-10-CM | POA: Diagnosis not present

## 2017-05-06 DIAGNOSIS — I4891 Unspecified atrial fibrillation: Secondary | ICD-10-CM | POA: Diagnosis not present

## 2017-05-06 DIAGNOSIS — M4316 Spondylolisthesis, lumbar region: Secondary | ICD-10-CM | POA: Diagnosis not present

## 2017-05-06 DIAGNOSIS — I10 Essential (primary) hypertension: Secondary | ICD-10-CM | POA: Diagnosis not present

## 2017-05-06 DIAGNOSIS — M810 Age-related osteoporosis without current pathological fracture: Secondary | ICD-10-CM | POA: Diagnosis not present

## 2017-05-06 DIAGNOSIS — I2510 Atherosclerotic heart disease of native coronary artery without angina pectoris: Secondary | ICD-10-CM | POA: Diagnosis not present

## 2017-05-08 DIAGNOSIS — I2510 Atherosclerotic heart disease of native coronary artery without angina pectoris: Secondary | ICD-10-CM | POA: Diagnosis not present

## 2017-05-08 DIAGNOSIS — I10 Essential (primary) hypertension: Secondary | ICD-10-CM | POA: Diagnosis not present

## 2017-05-08 DIAGNOSIS — M069 Rheumatoid arthritis, unspecified: Secondary | ICD-10-CM | POA: Diagnosis not present

## 2017-05-08 DIAGNOSIS — M4316 Spondylolisthesis, lumbar region: Secondary | ICD-10-CM | POA: Diagnosis not present

## 2017-05-08 DIAGNOSIS — I4891 Unspecified atrial fibrillation: Secondary | ICD-10-CM | POA: Diagnosis not present

## 2017-05-08 DIAGNOSIS — M810 Age-related osteoporosis without current pathological fracture: Secondary | ICD-10-CM | POA: Diagnosis not present

## 2017-05-12 ENCOUNTER — Other Ambulatory Visit: Payer: Self-pay | Admitting: Cardiovascular Disease

## 2017-05-12 DIAGNOSIS — R600 Localized edema: Secondary | ICD-10-CM

## 2017-05-13 DIAGNOSIS — M810 Age-related osteoporosis without current pathological fracture: Secondary | ICD-10-CM | POA: Diagnosis not present

## 2017-05-13 DIAGNOSIS — M069 Rheumatoid arthritis, unspecified: Secondary | ICD-10-CM | POA: Diagnosis not present

## 2017-05-13 DIAGNOSIS — I10 Essential (primary) hypertension: Secondary | ICD-10-CM | POA: Diagnosis not present

## 2017-05-13 DIAGNOSIS — M4316 Spondylolisthesis, lumbar region: Secondary | ICD-10-CM | POA: Diagnosis not present

## 2017-05-13 DIAGNOSIS — I4891 Unspecified atrial fibrillation: Secondary | ICD-10-CM | POA: Diagnosis not present

## 2017-05-13 DIAGNOSIS — I2510 Atherosclerotic heart disease of native coronary artery without angina pectoris: Secondary | ICD-10-CM | POA: Diagnosis not present

## 2017-05-17 DIAGNOSIS — I2510 Atherosclerotic heart disease of native coronary artery without angina pectoris: Secondary | ICD-10-CM | POA: Diagnosis not present

## 2017-05-17 DIAGNOSIS — M069 Rheumatoid arthritis, unspecified: Secondary | ICD-10-CM | POA: Diagnosis not present

## 2017-05-17 DIAGNOSIS — M810 Age-related osteoporosis without current pathological fracture: Secondary | ICD-10-CM | POA: Diagnosis not present

## 2017-05-17 DIAGNOSIS — I10 Essential (primary) hypertension: Secondary | ICD-10-CM | POA: Diagnosis not present

## 2017-05-17 DIAGNOSIS — I4891 Unspecified atrial fibrillation: Secondary | ICD-10-CM | POA: Diagnosis not present

## 2017-05-17 DIAGNOSIS — M4316 Spondylolisthesis, lumbar region: Secondary | ICD-10-CM | POA: Diagnosis not present

## 2017-05-29 DIAGNOSIS — Z961 Presence of intraocular lens: Secondary | ICD-10-CM | POA: Diagnosis not present

## 2017-05-30 DIAGNOSIS — M25511 Pain in right shoulder: Secondary | ICD-10-CM | POA: Diagnosis not present

## 2017-05-30 DIAGNOSIS — M21921 Unspecified acquired deformity of right upper arm: Secondary | ICD-10-CM | POA: Diagnosis not present

## 2017-05-30 DIAGNOSIS — Z79899 Other long term (current) drug therapy: Secondary | ICD-10-CM | POA: Diagnosis not present

## 2017-05-30 DIAGNOSIS — Z681 Body mass index (BMI) 19 or less, adult: Secondary | ICD-10-CM | POA: Diagnosis not present

## 2017-05-30 DIAGNOSIS — M2550 Pain in unspecified joint: Secondary | ICD-10-CM | POA: Diagnosis not present

## 2017-05-30 DIAGNOSIS — G8929 Other chronic pain: Secondary | ICD-10-CM | POA: Diagnosis not present

## 2017-05-30 DIAGNOSIS — K3184 Gastroparesis: Secondary | ICD-10-CM | POA: Diagnosis not present

## 2017-05-30 DIAGNOSIS — Z6820 Body mass index (BMI) 20.0-20.9, adult: Secondary | ICD-10-CM | POA: Diagnosis not present

## 2017-05-30 DIAGNOSIS — M0579 Rheumatoid arthritis with rheumatoid factor of multiple sites without organ or systems involvement: Secondary | ICD-10-CM | POA: Diagnosis not present

## 2017-05-30 DIAGNOSIS — R739 Hyperglycemia, unspecified: Secondary | ICD-10-CM | POA: Diagnosis not present

## 2017-06-25 ENCOUNTER — Telehealth: Payer: Self-pay | Admitting: Registered Nurse

## 2017-06-25 ENCOUNTER — Encounter: Payer: Self-pay | Admitting: Registered Nurse

## 2017-06-25 ENCOUNTER — Encounter: Payer: Medicare Other | Attending: Registered Nurse | Admitting: Registered Nurse

## 2017-06-25 VITALS — BP 93/60 | HR 85

## 2017-06-25 DIAGNOSIS — R42 Dizziness and giddiness: Secondary | ICD-10-CM | POA: Diagnosis not present

## 2017-06-25 DIAGNOSIS — M25522 Pain in left elbow: Secondary | ICD-10-CM | POA: Diagnosis not present

## 2017-06-25 DIAGNOSIS — M545 Low back pain: Secondary | ICD-10-CM | POA: Diagnosis not present

## 2017-06-25 DIAGNOSIS — M05711 Rheumatoid arthritis with rheumatoid factor of right shoulder without organ or systems involvement: Secondary | ICD-10-CM

## 2017-06-25 DIAGNOSIS — Z79899 Other long term (current) drug therapy: Secondary | ICD-10-CM

## 2017-06-25 DIAGNOSIS — M4316 Spondylolisthesis, lumbar region: Secondary | ICD-10-CM

## 2017-06-25 DIAGNOSIS — F329 Major depressive disorder, single episode, unspecified: Secondary | ICD-10-CM | POA: Insufficient documentation

## 2017-06-25 DIAGNOSIS — M25521 Pain in right elbow: Secondary | ICD-10-CM | POA: Diagnosis not present

## 2017-06-25 DIAGNOSIS — Z760 Encounter for issue of repeat prescription: Secondary | ICD-10-CM | POA: Diagnosis not present

## 2017-06-25 DIAGNOSIS — M4712 Other spondylosis with myelopathy, cervical region: Secondary | ICD-10-CM | POA: Insufficient documentation

## 2017-06-25 DIAGNOSIS — Z5181 Encounter for therapeutic drug level monitoring: Secondary | ICD-10-CM

## 2017-06-25 DIAGNOSIS — M4802 Spinal stenosis, cervical region: Secondary | ICD-10-CM | POA: Insufficient documentation

## 2017-06-25 DIAGNOSIS — M542 Cervicalgia: Secondary | ICD-10-CM | POA: Diagnosis not present

## 2017-06-25 DIAGNOSIS — R200 Anesthesia of skin: Secondary | ICD-10-CM | POA: Insufficient documentation

## 2017-06-25 DIAGNOSIS — M25511 Pain in right shoulder: Secondary | ICD-10-CM | POA: Insufficient documentation

## 2017-06-25 DIAGNOSIS — M069 Rheumatoid arthritis, unspecified: Secondary | ICD-10-CM | POA: Diagnosis not present

## 2017-06-25 DIAGNOSIS — G894 Chronic pain syndrome: Secondary | ICD-10-CM | POA: Diagnosis not present

## 2017-06-25 DIAGNOSIS — F419 Anxiety disorder, unspecified: Secondary | ICD-10-CM | POA: Insufficient documentation

## 2017-06-25 DIAGNOSIS — M5412 Radiculopathy, cervical region: Secondary | ICD-10-CM | POA: Diagnosis not present

## 2017-06-25 DIAGNOSIS — G8929 Other chronic pain: Secondary | ICD-10-CM | POA: Diagnosis not present

## 2017-06-25 DIAGNOSIS — M25512 Pain in left shoulder: Secondary | ICD-10-CM | POA: Insufficient documentation

## 2017-06-25 DIAGNOSIS — M05712 Rheumatoid arthritis with rheumatoid factor of left shoulder without organ or systems involvement: Secondary | ICD-10-CM

## 2017-06-25 MED ORDER — OXYCODONE-ACETAMINOPHEN 7.5-325 MG PO TABS
1.0000 | ORAL_TABLET | Freq: Four times a day (QID) | ORAL | 0 refills | Status: DC | PRN
Start: 2017-06-25 — End: 2017-09-18

## 2017-06-25 NOTE — Progress Notes (Signed)
Subjective:    Patient ID: Angela [name], female    DOB: July 12, 1936, 81 y.o.   MRN: [id number]  HPI: [name] is a 81year old female who returns for follow up appointment for chronic pain and medication refill. She statesher pain is located in her right elbow, neck radiating into her bilateral shoulders R>L. She rates her pain 8. Ms. Tuel uses her lidoderm patches and voltaren gel for her right shoulder. Also uses the Percocet sparingly ( her current prescription was picked up on 04/11/2017) . Her current exercise regime is walking with the walker in her home.   [name] caregiver brought her to the office today.   Last UDS was performed on 03/25/17, it was consistent.  Pain Inventory Average Pain 9 Pain Right Now 8 My pain is burning, dull and aching  In the last 24 hours, has pain interfered with the following? General activity 8 Relation with others 8 Enjoyment of life 8 What TIME of day is your pain at its worst? evening Sleep (in general) Fair  Pain is worse with: bending and some activites Pain improves with: rest, heat/ice and medication Relief from Meds: 8  Mobility walk with assistance use a walker ability to climb steps?  no do you drive?  no  Function retired I need assistance with the following:  bathing, meal prep, household duties and shopping  Neuro/Psych bladder control problems bowel control problems weakness trouble walking dizziness depression anxiety  Prior Studies Any changes since last visit?  no  Physicians involved in your care Any changes since last visit?  no   Family History  Problem Relation Age of Onset  . Lung cancer Mother   . Arrhythmia Mother   . Melanoma Brother   . Heart attack Neg Hx    Social History   Social History  . Marital status: Widowed    Spouse name: N/A  . Number of children: N/A  . Years of education: N/A   Social History Main Topics  . Smoking status: Never Smoker  . Smokeless  tobacco: Never Used  . Alcohol use No  . Drug use: No  . Sexual activity: Not on file   Other Topics Concern  . Not on file   Social History Narrative  . No narrative on file   Past Surgical History:  Procedure Laterality Date  . ANKLE FUSION Bilateral    x2 both  . CARDIAC CATHETERIZATION  09/2012   patent LAD stent and otherwise normal coronary arteries  . CARDIAC CATHETERIZATION N/A 03/07/2016   Procedure: Left Heart Cath and Coronary Angiography;  Surgeon: Peter M Martinique, MD;  Location: [facility];  Service: Cardiovascular;  Laterality: N/A;  . CATARACT EXTRACTION, BILATERAL Bilateral   . CERVICAL FUSION     limited  ROM  . CHOLECYSTECTOMY    . ESOPHAGOGASTRODUODENOSCOPY N/A 01/07/2016   Procedure: ESOPHAGOGASTRODUODENOSCOPY (EGD);  Surgeon: Wilford Corner, MD;  Location: [facility];  Service: Endoscopy;  Laterality: N/A;  . ESOPHAGOGASTRODUODENOSCOPY (EGD) WITH PROPOFOL N/A 11/01/2015   Procedure: ESOPHAGOGASTRODUODENOSCOPY (EGD) WITH PROPOFOL w/ Dialtation;  Surgeon: Garlan Fair, MD;  Location: [facility];  Service: Endoscopy;  Laterality: N/A;  . FRACTURE SURGERY  09/2011   left ankle screw and pin removed  . HAND SURGERY Bilateral    x4 digits 2-5 both hands  . JOINT REPLACEMENT Bilateral    BTKA  . TONSILLECTOMY     Past Medical History:  Diagnosis Date  . A-fib ([place])   .  Aortic stenosis    mild by echo 03/2012  . Asthma   . CAD (coronary artery disease)    s/p PCI with BMS to mid LAD--2/08 not on aspirin due to frequent ulcers.  Repeat cath for CP 09/2012 with patent LAD stent and otherwise normal coronary arteries  . Cervical stenosis of spine   . Depression   . Dyslipidemia   . Elbow fracture, right    history of- never any surgery  . GERD (gastroesophageal reflux disease)   . Hypertension   . Impaired physical mobility    due to rheumatoid arthritis "ambulates with walker" limited free standing.  . Macular degeneration   . Obesity   .  Osteoporosis    alendronate since 2012  . PUD (peptic ulcer disease)    can not take aspirin  . PVC's (premature ventricular contractions)   . Rheumatoid arthritis(714.0)    Dr. Lenna Gilford follows  . Spondylolisthesis of lumbar region    There were no vitals taken for this visit.  Opioid Risk Score:   Fall Risk Score:  `1  Depression screen PHQ 2/9  Depression screen [facility] 2/9 07/04/2016 02/14/2015  Decreased Interest 0 2  Down, Depressed, Hopeless 1 2  PHQ - 2 Score 1 4  Altered sleeping - 2  Tired, decreased energy - 3  Change in appetite - 2  Feeling bad or failure about yourself  - 2  Trouble concentrating - 2  Moving slowly or fidgety/restless - 1  Suicidal thoughts - 0  PHQ-9 Score - 16     Review of Systems  Constitutional: Positive for appetite change and unexpected weight change.  HENT: Negative.   Eyes: Negative.   Respiratory: Negative.   Cardiovascular: Negative.   Gastrointestinal: Positive for abdominal pain, diarrhea and nausea.  Endocrine: Negative.   Genitourinary: Negative.   Musculoskeletal: Negative.   Skin: Negative.   Allergic/Immunologic: Negative.   Neurological: Negative.   Hematological: Bruises/bleeds easily.  Psychiatric/Behavioral: Negative.   All other systems reviewed and are negative.      Objective:   Physical Exam  Constitutional: She is oriented to person, place, and time. She appears well-developed and well-nourished.  HENT:  Head: Normocephalic and atraumatic.  Neck: Normal range of motion. Neck supple.  Cervical Paraspinal Tenderness: C-5-C-6   Cardiovascular: Normal rate and regular rhythm.   Pulmonary/Chest: Effort normal and breath sounds normal.  Musculoskeletal:  Normal Muscle Bulk and Muscle Testing Reveals: Upper Extremities: Decreased ROM 90 Degrees and Muscle Strength 3/3 Right AC Joint Tenderness Thoracic Paraspinal Tenderness: T-3-T-7 Lower Extremities: Full ROM and  Muscle Strength 5/5 Anasarca to Lower  Extremities Arises from Table Slowly using walker for support Narrow Based Gait  Neurological: She is alert and oriented to person, place, and time.  Skin: Skin is warm and dry.  Psychiatric: She has a normal mood and affect.  Nursing note and vitals reviewed.         Assessment & Plan:  1. History of cervical stenosis/spondylosis with myelopathy: 06/25/2017 Refilled: Oxycodone 7.5/325mg  one tablet every 6hours as needed for pain #120.  We will continue the opioid monitoring program, this consists of regular clinic visits, examinations, urine drug screen, pill counts as well as use of [place] Controlled Substance reporting System. 2. Rheumatoid arthritis. Uses the Lidocaine Patches and alternates with Voltaren gel. Continue to Monitor. 06/25/2017 3. Lumbar spondylolisthesis. Continue with exercise and heat therapy. 06/25/2017 4. Reactive Depression: Continue Lexapro. 06/25/2017  20 minutes of face to face patient care time  was spent during this visit. All questions were encouraged and answered.  F/U in 23months

## 2017-06-25 NOTE — Telephone Encounter (Signed)
On 06/25/2017 the [place] was reviewed no conflict was seen on the [facility] with multiple prescribers. Ms. Murren has a signed narcotic contract with our office. If there were any discrepancies this would have been reported to her physician.

## 2017-07-05 DIAGNOSIS — M810 Age-related osteoporosis without current pathological fracture: Secondary | ICD-10-CM | POA: Diagnosis not present

## 2017-07-05 DIAGNOSIS — I2510 Atherosclerotic heart disease of native coronary artery without angina pectoris: Secondary | ICD-10-CM | POA: Diagnosis not present

## 2017-07-05 DIAGNOSIS — M069 Rheumatoid arthritis, unspecified: Secondary | ICD-10-CM | POA: Diagnosis not present

## 2017-07-05 DIAGNOSIS — I482 Chronic atrial fibrillation: Secondary | ICD-10-CM | POA: Diagnosis not present

## 2017-07-05 DIAGNOSIS — F329 Major depressive disorder, single episode, unspecified: Secondary | ICD-10-CM | POA: Diagnosis not present

## 2017-07-05 DIAGNOSIS — I10 Essential (primary) hypertension: Secondary | ICD-10-CM | POA: Diagnosis not present

## 2017-07-05 DIAGNOSIS — E7800 Pure hypercholesterolemia, unspecified: Secondary | ICD-10-CM | POA: Diagnosis not present

## 2017-07-12 DIAGNOSIS — N3946 Mixed incontinence: Secondary | ICD-10-CM | POA: Diagnosis not present

## 2017-07-12 DIAGNOSIS — R350 Frequency of micturition: Secondary | ICD-10-CM | POA: Diagnosis not present

## 2017-07-17 ENCOUNTER — Other Ambulatory Visit: Payer: Self-pay | Admitting: Internal Medicine

## 2017-07-17 DIAGNOSIS — Z1231 Encounter for screening mammogram for malignant neoplasm of breast: Secondary | ICD-10-CM

## 2017-07-31 ENCOUNTER — Encounter: Payer: Self-pay | Admitting: Physician Assistant

## 2017-07-31 ENCOUNTER — Ambulatory Visit (INDEPENDENT_AMBULATORY_CARE_PROVIDER_SITE_OTHER): Payer: Medicare Other | Admitting: Cardiology

## 2017-07-31 VITALS — BP 98/60 | HR 86 | Ht 61.0 in | Wt 111.0 lb

## 2017-07-31 DIAGNOSIS — I480 Paroxysmal atrial fibrillation: Secondary | ICD-10-CM

## 2017-07-31 DIAGNOSIS — I350 Nonrheumatic aortic (valve) stenosis: Secondary | ICD-10-CM

## 2017-07-31 DIAGNOSIS — R609 Edema, unspecified: Secondary | ICD-10-CM

## 2017-07-31 DIAGNOSIS — I429 Cardiomyopathy, unspecified: Secondary | ICD-10-CM | POA: Diagnosis not present

## 2017-07-31 MED ORDER — APIXABAN 5 MG PO TABS: 5.0000 mg | ORAL_TABLET | Freq: Two times a day (BID) | ORAL | 3 refills | Status: AC

## 2017-07-31 MED ORDER — ACEBUTOLOL HCL 200 MG PO CAPS: 200.0000 mg | ORAL_CAPSULE | ORAL | 3 refills | Status: DC

## 2017-07-31 MED ORDER — DIGOXIN 125 MCG PO TABS: 0.1250 mg | ORAL_TABLET | Freq: Every day | ORAL | 3 refills | Status: DC

## 2017-07-31 NOTE — Patient Instructions (Signed)
Medication Instructions:  Your physician recommends that you continue on your current medications as directed. Please refer to the Current Medication list given to you today.  Refills sent in for Acebutolol, Digoxin, and Eliquis   Labwork: Today: BMET, TSH, DIGOXIN LEVEL   Testing/Procedures: None ordered today  Follow-Up:  Dr. Acie Fredrickson on October 22, 2017 @ 2:00pm  Any Other Special Instructions Will Be Listed Below (If Applicable).  Wear compression stockings daily.   Lower sodium intake.   Elevate legs when sitting.  If you need a refill on your cardiac medications before your next appointment, please call your pharmacy.

## 2017-07-31 NOTE — Progress Notes (Signed)
Cardiology Office Note:    Date:  07/31/2017   ID:  Angela [name], DOB 07-15-1936, MRN [id number]  PCP:  [name], MD  Cardiologist:  Dr. Acie Fredrickson (previously Dr. Radford Pax)  Referring MD: [name], MD   Chief Complaint  Patient presents with  . Follow-up    atrial fibrillation    History of Present Illness:    Angela [name] is a 81 y.o. female with a past medical history significant for Paroxysmal atrial fibrillation, rheumatoid arthritis, gastric outlet obstruction, mild pulmonary hypertension, moderate aortic stenosis, CAD status post prior PCI to LAD, hyperlipidemia, hypertension, PVCs. She was seen for bradycardia in 06/2014 and Holter monitor showed normal sinus rhythm with PACs and PVCs and atrial tach. Toprol was changed to acebutolol. The bradycardia resolved. She was last seen by Dr. Acie Fredrickson on 01/17/17. Prior echo suggested moderate aortic stenosis. Repeat limited echo demonstrated just mild left ear.  She has had lower extremity in the past felt to be multifactorial and related to venous insufficiency, inactivity, diastolic dysfunction. She was told to take extra furosemide as needed and wear compression stockings and keep her feet elevated. In 01/2016 she was hospitalized for gastric outlet obstruction noted on EGD. During this admission she developed atrial flutter with RVR. Echocardiogram demonstrated moderate AF, moderate TR, EF 45%, LAE. Her acebutolol was increased to 400 mg twice a day and she converted to normal sinus rhythm. She also had episodes of PSVT. She was given when necessary propranolol to use. CHADS2-VASc=6 (CHF, vascular dz, female, age, HTN).  She was placed on Apixaban.   Acebutolol was subsequently decreased in 01/2017 due to low blood pressure.  An echocardiogram on 01/14/2016 showed EF 45%. A nuclear stress test was ordered to further evaluate her decreased LV function in 02/2016 that showed inferior lateral infarct with peri-infarct ischemia. She had a  Left heart cath on 03/07/16 that showed no obstructive CAD. Stent in the mid LAD was widely patent.  Today the patient comes in for follow-up with her care.. In May of this year the patient was seen by physical medicine and rehabilitation and noted to be experiencing physical weakness and debilitation. She was ordered home physical therapy. This has helped some, however, she feels that she is not doing her exercises as she should. Her EKG today shows atrial fibrillation. The patient has had no awareness of this, no palpitations and she does not feel like she did when she presented with atrial fibrillation. She has not taken any of her as needed propranolol. She denies exertional chest pain or dyspnea. She did have one episode of chest and jaw discomfort while she was in the bed. She took 2 aspirin and walked around and it subsided within 10 minutes. This only occurred once and she was not concerned about it. She denies orthopnea or PND. She does have edema to just above her ankles which is chronic for her and currently not more than her usual. This goes down overnight. She is not wearing compression stockings. She now she should elevate her feet but she often forgets. She is using Lasix 40 mg daily and takes an extra 40 mg about once or twice a week as needed for extra swelling.  Past Medical History:  Diagnosis Date  . A-fib ([place])   . Aortic stenosis    mild by echo 03/2012  . Asthma   . CAD (coronary artery disease)    s/p PCI with BMS to mid LAD--2/08 not on aspirin due to frequent  ulcers.  Repeat cath for CP 09/2012 with patent LAD stent and otherwise normal coronary arteries  . Cervical stenosis of spine   . Depression   . Dyslipidemia   . Elbow fracture, right    history of- never any surgery  . GERD (gastroesophageal reflux disease)   . Hypertension   . Impaired physical mobility    due to rheumatoid arthritis "ambulates with walker" limited free standing.  . Macular degeneration   .  Obesity   . Osteoporosis    alendronate since 2012  . PUD (peptic ulcer disease)    can not take aspirin  . PVC's (premature ventricular contractions)   . Rheumatoid arthritis(714.0)    Dr. Lenna Gilford follows  . Spondylolisthesis of lumbar region     Past Surgical History:  Procedure Laterality Date  . ANKLE FUSION Bilateral    x2 both  . CARDIAC CATHETERIZATION  09/2012   patent LAD stent and otherwise normal coronary arteries  . CARDIAC CATHETERIZATION N/A 03/07/2016   Procedure: Left Heart Cath and Coronary Angiography;  Surgeon: Peter M Martinique, MD;  Location: [facility];  Service: Cardiovascular;  Laterality: N/A;  . CATARACT EXTRACTION, BILATERAL Bilateral   . CERVICAL FUSION     limited  ROM  . CHOLECYSTECTOMY    . ESOPHAGOGASTRODUODENOSCOPY N/A 01/07/2016   Procedure: ESOPHAGOGASTRODUODENOSCOPY (EGD);  Surgeon: Wilford Corner, MD;  Location: [facility];  Service: Endoscopy;  Laterality: N/A;  . ESOPHAGOGASTRODUODENOSCOPY (EGD) WITH PROPOFOL N/A 11/01/2015   Procedure: ESOPHAGOGASTRODUODENOSCOPY (EGD) WITH PROPOFOL w/ Dialtation;  Surgeon: Garlan Fair, MD;  Location: [facility];  Service: Endoscopy;  Laterality: N/A;  . FRACTURE SURGERY  09/2011   left ankle screw and pin removed  . HAND SURGERY Bilateral    x4 digits 2-5 both hands  . JOINT REPLACEMENT Bilateral    BTKA  . TONSILLECTOMY      Current Medications: Current Meds  Medication Sig  . butalbital-acetaminophen-caffeine (FIORICET, ESGIC) 50-325-40 MG per tablet Take 1 tablet by mouth every 6 (six) hours as needed for headache or migraine. For migraines  . Calcium Carbonate-Vitamin D (CALCIUM 500+D PO) Take 1 tablet by mouth 2 (two) times daily.   . DENTAGEL 1.1 % GEL dental gel Place 1 application onto teeth at bedtime.   . diclofenac sodium (VOLTAREN) 1 % GEL Apply 2 g topically 2 (two) times daily as needed (for pain). For pain  . Docusate Calcium (STOOL SOFTENER PO) Take 1 tablet by mouth at  bedtime.  Marland Kitchen escitalopram (LEXAPRO) 5 MG tablet TAKE 1 TABLET BY MOUTH AT BEDTIME  . fluticasone (FLONASE) 50 MCG/ACT nasal spray Place 1 spray into both nostrils daily as needed for allergies.   . folic acid (FOLVITE) 1 MG tablet Take 1 mg by mouth daily.   . furosemide (LASIX) 40 MG tablet TAKE 1 TABLET BY MOUTH EVERY DAY TAKE 1/2 TAB AS NEEDED FOR EXTRA SWELLING  . lidocaine (LIDODERM) 5 % Place 3 patches onto the skin every 12 (twelve) hours. APPLY NECK AND ELBOW  . LORazepam (ATIVAN) 2 MG tablet Take 2 mg by mouth at bedtime.  . methotrexate (RHEUMATREX) 2.5 MG tablet Take 15 mg by mouth every Saturday.   . nitroGLYCERIN (NITROSTAT) 0.4 MG SL tablet Place 1 tablet (0.4 mg total) under the tongue every 5 (five) minutes as needed for chest pain.  Marland Kitchen nystatin cream (MYCOSTATIN) Apply 1 application topically daily as needed for dry skin.  Marland Kitchen oxyCODONE-acetaminophen (PERCOCET) 7.5-325 MG tablet Take 1 tablet by mouth  every 6 (six) hours as needed for moderate pain.  . pantoprazole (PROTONIX) 40 MG tablet Take 40 mg by mouth 2 (two) times daily.   . predniSONE (DELTASONE) 5 MG tablet Take 5 mg by mouth daily with breakfast.   . PREVIDENT 0.2 % SOLN Take 1 each by mouth at bedtime. Rinses with about 1 capful nightly  . propranolol (INDERAL) 10 MG tablet Take 1 tablet (10 mg total) by mouth daily as needed (palpitations lasting more than 5 minutes).  . simvastatin (ZOCOR) 40 MG tablet TAKE 1 TABLET BY MOUTH AT BEDTIME  . TOVIAZ 8 MG TB24 tablet Take 8 mg by mouth daily.     Allergies:   Peanut-containing drug products; Reglan [metoclopramide]; Clindamycin/lincomycin; Codeine; Iohexol; Pineapple; and Shellfish allergy   Social History   Social History  . Marital status: Widowed    Spouse name: N/A  . Number of children: N/A  . Years of education: N/A   Social History Main Topics  . Smoking status: Never Smoker  . Smokeless tobacco: Never Used  . Alcohol use No  . Drug use: No  . Sexual  activity: Not Asked   Other Topics Concern  . None   Social History Narrative  . None     Family History: The patient's family history includes Arrhythmia in her mother; Lung cancer in her mother; Melanoma in her brother. There is no history of Heart attack. ROS:   Please see the history of present illness.     All other systems reviewed and are negative.  EKGs/Labs/Other Studies Reviewed:    The following studies were reviewed today:  Left Heart Cath and Coronary Angiography 03/07/16   Conclusion   Prox LAD to Mid LAD lesion, 10% stenosed. The lesion was previously treated with a bare metal stent greater than two years ago.  The left ventricular systolic function is normal.   1. No obstructive CAD. Stent in the mid LAD is widely patent. 2. Good overall LV function. There is focal hypokinesis of the basal inferior wall that has been present since cardiac cath in 2005.    Diagnostic Diagram      Echocardiogram 01/14/2016 Study Conclusions - Left ventricle: The cavity size was normal. Wall thickness was   normal. The estimated ejection fraction was 45%. Diffuse   hypokinesis. Although no diagnostic regional wall motion   abnormality was identified, this possibility cannot be completely   excluded on the basis of this study. - Aortic valve: There was moderate stenosis. Mean gradient (S): 16   mm Hg. Peak gradient (S): 29 mm Hg. VTI ratio of LVOT to aortic   valve: 0.18. - Mitral valve: Mildly thickened leaflets . There was mild   regurgitation. - Left atrium: The atrium was moderately to severely dilated. - Right atrium: Central venous pressure (est): 3 mm Hg. - Tricuspid valve: There was moderate regurgitation. - Pulmonary arteries: PA peak pressure: 34 mm Hg (S). - Pericardium, extracardiac: There was no pericardial effusion.  Impressions: - Normal LV wall thickness with LVEF approximately 45%.   Indeterinate diastolic function. Mildly thickened mitral leaflets    with mild mitral regurgitation. Moderate to severe left atrial   enlargement. Moderate aortic stenosis based on gradients and   planimatry. Moderate tricuspid regurgitation with PASP 34 mmHg.  Echocardiogram 03/09/2015:  Mild aortic stenosis with mean gradient 13 mmHg   EKG:  EKG is  ordered today.  The ekg ordered today demonstrates atrial fibrillation, 86 bpm, no change  Recent Labs: No  results found for requested labs within last 8760 hours.   Recent Lipid Panel    Component Value Date/Time   CHOL 129 02/24/2015 1030   TRIG 81 01/09/2016 0515   HDL 58.80 02/24/2015 1030   CHOLHDL 2 02/24/2015 1030   VLDL 17.6 02/24/2015 1030   LDLCALC 53 02/24/2015 1030    Physical Exam:    VS:  BP 98/60   Pulse 86   Ht 5\' 1"  (1.549 m)   Wt 111 lb (50.3 kg)   BMI 20.97 kg/m     Wt Readings from Last 3 Encounters:  07/31/17 111 lb (50.3 kg)  01/17/17 113 lb 1.9 oz (51.3 kg)  06/12/16 114 lb 14.4 oz (52.1 kg)     Physical Exam  Constitutional: She is oriented to person, place, and time. She appears well-developed and well-nourished. No distress.  HENT:  Head: Normocephalic and atraumatic.  Neck: Normal range of motion. Neck supple. No JVD present.  Cardiovascular: Normal rate.  An irregularly irregular rhythm present.  Murmur heard.  Systolic murmur is present with a grade of 1/6  at the upper right sternal border Pulmonary/Chest: Effort normal and breath sounds normal. No respiratory distress. She has no wheezes. She has no rales.  Abdominal: Soft. Bowel sounds are normal. She exhibits no distension. There is no tenderness.  Musculoskeletal: Normal range of motion. She exhibits edema.  1+ pitting edema L>R.  Multiple joint arthritic changes  Neurological: She is alert and oriented to person, place, and time.  Skin: Skin is warm and dry.  Psychiatric: She has a normal mood and affect. Her behavior is normal. Thought content normal.     ASSESSMENT:    1. Paroxysmal atrial  fibrillation ([facility])   2. Edema, unspecified type   3. Cardiomyopathy, unspecified type ([place])   4. Nonrheumatic aortic valve stenosis    PLAN:    In order of problems listed above:  1. PAF: History of atrial flutter with RVR during acute illness 01/2016. Managed with acebutolol snd digoxin.  CHADS2-VASc=6.  Continue long term anticoagulation with Apixaban. Today she is in atrial fibrillation with controlled rate 86 bpm. She was also in A. fib at her last appointment. She has no awareness and is asymptomatic. She has propranolol as needed for palpitations, but she has not used any. She seems to be tolerating a rate control strategy. Her blood pressures not sufficient to increase her beta blocker. She is advised to notify us if she develops symptoms not responsive to her when necessary propranolol. We discussed use of rhythm control medicines and/or cardioversion and she wishes to continue her current therapy with rate control. Will check dig level and thyroid function (has had previous thyroid irradiation)  2. Cardiomyopathy:  EF 45% by echo 01/2016, possibly decreased due to a flutter with RVR. Patient has chronic edema, which is no worse currently. Her lungs are clear and she denies shortness of breath or orthopnea.   3. CAD:  last cardiac cath 03/2016 showed no obstructive CAD. Stent in the mid LAD is widely patent. No longer on Plavix as she is on Apixaban. No significant chest pain. Continue statin.   4. Aortic stenosis:  moderate by most recent echo. Very faint murmur. Asymptomatic  5. Hypertension: BP low 98/60. Only occ lightheadedness when stands, esp first thing in the morning. Pt advised on safety precautions, otherwise is asymptomatic.  6. Edema: Multi-factorial. Is at her baseline per pt. Lasix 40 mg daily and takes extra dose 1-2 times pre week.  Advised to use compression stockings, elevate legs and limit sodium intake. Will check renal function.   Medication Adjustments/Labs and Tests  Ordered: Current medicines are reviewed at length with the patient today.  Concerns regarding medicines are outlined above. Labs and tests ordered and medication changes are outlined in the patient instructions below:  Patient Instructions  Medication Instructions:  Your physician recommends that you continue on your current medications as directed. Please refer to the Current Medication list given to you today.  Refills sent in for Acebutolol, Digoxin, and Eliquis   Labwork: Today: BMET, TSH, DIGOXIN LEVEL   Testing/Procedures: None ordered today  Follow-Up:  Dr. Acie Fredrickson on October 22, 2017 @ 2:00pm  Any Other Special Instructions Will Be Listed Below (If Applicable).  Wear compression stockings daily.   Lower sodium intake.   Elevate legs when sitting.  If you need a refill on your cardiac medications before your next appointment, please call your pharmacy.      Signed, Daune Perch, NP  07/31/2017 3:05 PM    [facility]

## 2017-08-01 LAB — BASIC METABOLIC PANEL
BUN/Creatinine Ratio: 19 (ref 12–28)
BUN: 9 mg/dL (ref 8–27)
CALCIUM: 8.8 mg/dL (ref 8.7–10.3)
CHLORIDE: 100 mmol/L (ref 96–106)
CO2: 22 mmol/L (ref 20–29)
Creatinine, Ser: 0.48 mg/dL — ABNORMAL LOW (ref 0.57–1.00)
GFR calc Af Amer: 109 mL/min/{1.73_m2} (ref >59)
GFR calc non Af Amer: 94 mL/min/{1.73_m2} (ref >59)
GLUCOSE: 121 mg/dL — AB (ref 65–99)
POTASSIUM: 3.6 mmol/L (ref 3.5–5.2)
SODIUM: 138 mmol/L (ref 134–144)

## 2017-08-01 LAB — TSH: TSH: 2.35 u[IU]/mL (ref 0.450–4.500)

## 2017-08-01 LAB — DIGOXIN LEVEL: DIGOXIN, SERUM: 0.9 ng/mL (ref 0.5–0.9)

## 2017-08-08 DIAGNOSIS — I10 Essential (primary) hypertension: Secondary | ICD-10-CM | POA: Diagnosis not present

## 2017-08-08 DIAGNOSIS — E43 Unspecified severe protein-calorie malnutrition: Secondary | ICD-10-CM | POA: Diagnosis not present

## 2017-08-08 DIAGNOSIS — I482 Chronic atrial fibrillation: Secondary | ICD-10-CM | POA: Diagnosis not present

## 2017-08-08 DIAGNOSIS — K219 Gastro-esophageal reflux disease without esophagitis: Secondary | ICD-10-CM | POA: Diagnosis not present

## 2017-08-08 DIAGNOSIS — K3184 Gastroparesis: Secondary | ICD-10-CM | POA: Diagnosis not present

## 2017-08-08 DIAGNOSIS — Z23 Encounter for immunization: Secondary | ICD-10-CM | POA: Diagnosis not present

## 2017-08-18 ENCOUNTER — Other Ambulatory Visit: Payer: Self-pay | Admitting: Physical Medicine & Rehabilitation

## 2017-08-18 DIAGNOSIS — F329 Major depressive disorder, single episode, unspecified: Secondary | ICD-10-CM

## 2017-08-22 ENCOUNTER — Ambulatory Visit: Payer: Medicare Other

## 2017-09-02 DIAGNOSIS — Z681 Body mass index (BMI) 19 or less, adult: Secondary | ICD-10-CM | POA: Diagnosis not present

## 2017-09-02 DIAGNOSIS — Z79899 Other long term (current) drug therapy: Secondary | ICD-10-CM | POA: Diagnosis not present

## 2017-09-02 DIAGNOSIS — M0579 Rheumatoid arthritis with rheumatoid factor of multiple sites without organ or systems involvement: Secondary | ICD-10-CM | POA: Diagnosis not present

## 2017-09-02 DIAGNOSIS — M2550 Pain in unspecified joint: Secondary | ICD-10-CM | POA: Diagnosis not present

## 2017-09-18 ENCOUNTER — Encounter: Payer: Self-pay | Admitting: Physical Medicine & Rehabilitation

## 2017-09-18 ENCOUNTER — Encounter: Payer: Medicare Other | Attending: Registered Nurse | Admitting: Physical Medicine & Rehabilitation

## 2017-09-18 ENCOUNTER — Other Ambulatory Visit: Payer: Self-pay

## 2017-09-18 DIAGNOSIS — Z760 Encounter for issue of repeat prescription: Secondary | ICD-10-CM | POA: Diagnosis not present

## 2017-09-18 DIAGNOSIS — R42 Dizziness and giddiness: Secondary | ICD-10-CM | POA: Diagnosis not present

## 2017-09-18 DIAGNOSIS — M069 Rheumatoid arthritis, unspecified: Secondary | ICD-10-CM | POA: Insufficient documentation

## 2017-09-18 DIAGNOSIS — M4712 Other spondylosis with myelopathy, cervical region: Secondary | ICD-10-CM | POA: Insufficient documentation

## 2017-09-18 DIAGNOSIS — M25522 Pain in left elbow: Secondary | ICD-10-CM | POA: Diagnosis not present

## 2017-09-18 DIAGNOSIS — M4316 Spondylolisthesis, lumbar region: Secondary | ICD-10-CM | POA: Insufficient documentation

## 2017-09-18 DIAGNOSIS — F329 Major depressive disorder, single episode, unspecified: Secondary | ICD-10-CM | POA: Insufficient documentation

## 2017-09-18 DIAGNOSIS — M4802 Spinal stenosis, cervical region: Secondary | ICD-10-CM | POA: Insufficient documentation

## 2017-09-18 DIAGNOSIS — M25511 Pain in right shoulder: Secondary | ICD-10-CM | POA: Insufficient documentation

## 2017-09-18 DIAGNOSIS — M25512 Pain in left shoulder: Secondary | ICD-10-CM | POA: Diagnosis not present

## 2017-09-18 DIAGNOSIS — F419 Anxiety disorder, unspecified: Secondary | ICD-10-CM | POA: Diagnosis not present

## 2017-09-18 DIAGNOSIS — M05712 Rheumatoid arthritis with rheumatoid factor of left shoulder without organ or systems involvement: Secondary | ICD-10-CM | POA: Diagnosis not present

## 2017-09-18 DIAGNOSIS — M25521 Pain in right elbow: Secondary | ICD-10-CM | POA: Diagnosis not present

## 2017-09-18 DIAGNOSIS — G8929 Other chronic pain: Secondary | ICD-10-CM | POA: Diagnosis not present

## 2017-09-18 DIAGNOSIS — R200 Anesthesia of skin: Secondary | ICD-10-CM | POA: Diagnosis not present

## 2017-09-18 DIAGNOSIS — M545 Low back pain: Secondary | ICD-10-CM | POA: Insufficient documentation

## 2017-09-18 DIAGNOSIS — M05711 Rheumatoid arthritis with rheumatoid factor of right shoulder without organ or systems involvement: Secondary | ICD-10-CM

## 2017-09-18 MED ORDER — OXYCODONE-ACETAMINOPHEN 7.5-325 MG PO TABS: 1.0000 | ORAL_TABLET | Freq: Four times a day (QID) | ORAL | 0 refills | Status: DC | PRN

## 2017-09-18 NOTE — Progress Notes (Signed)
Subjective:    Patient ID: JHADA RISK, female    DOB: 03/07/1936, 81 y.o.   MRN: [id number]  HPI   Angela [name] is here in follow-up of her chronic pain syndrome. She has had worsening pain in her shoulders over the last year. She had a right shoulder injection about 2 weeks ago by rheumatology which seemed to help. She didn't have the left one injected.  She is noticed that it is increasingly difficult to perform activities over the head as well as some of her personal care tasks as well.    Pain control she remains on Percocet 7.5/325 1 every 6 hours as needed  Her bowels are regular and she's drinking a shake full of vitamins which her son makes for her.    Pain Inventory Average Pain 5 Pain Right Now 8 My pain is sharp and aching  In the last 24 hours, has pain interfered with the following? General activity 9 Relation with others 9 Enjoyment of life 9 What TIME of day is your pain at its worst? morning, night Sleep (in general) Fair  Pain is worse with: walking, bending and some activites Pain improves with: heat/ice, medication and injections Relief from Meds: 9  Mobility walk with assistance use a walker how many minutes can you walk? 1 ability to climb steps?  no do you drive?  no use a wheelchair transfers alone  Function retired I need assistance with the following:  dressing, bathing, meal prep, household duties and shopping  Neuro/Psych bladder control problems weakness numbness tingling trouble walking dizziness anxiety  Prior Studies Any changes since last visit?  no  Physicians involved in your care Any changes since last visit?  no   Family History  Problem Relation Age of Onset  . Lung cancer Mother   . Arrhythmia Mother   . Melanoma Brother   . Heart attack Neg Hx    Social History   Socioeconomic History  . Marital status: Widowed    Spouse name: None  . Number of children: None  . Years of education: None  . Highest education  level: None  Social Needs  . Financial resource strain: None  . Food insecurity - worry: None  . Food insecurity - inability: None  . Transportation needs - medical: None  . Transportation needs - non-medical: None  Occupational History  . None  Tobacco Use  . Smoking status: Never Smoker  . Smokeless tobacco: Never Used  Substance and Sexual Activity  . Alcohol use: No  . Drug use: No  . Sexual activity: None  Other Topics Concern  . None  Social History Narrative  . None   Past Surgical History:  Procedure Laterality Date  . ANKLE FUSION Bilateral    x2 both  . CARDIAC CATHETERIZATION  09/2012   patent LAD stent and otherwise normal coronary arteries  . CATARACT EXTRACTION, BILATERAL Bilateral   . CERVICAL FUSION     limited  ROM  . CHOLECYSTECTOMY    . FRACTURE SURGERY  09/2011   left ankle screw and pin removed  . HAND SURGERY Bilateral    x4 digits 2-5 both hands  . JOINT REPLACEMENT Bilateral    BTKA  . TONSILLECTOMY     Past Medical History:  Diagnosis Date  . A-fib ([place])   . Aortic stenosis    mild by echo 03/2012  . Asthma   . CAD (coronary artery disease)    s/p PCI with BMS to mid LAD--2/08  not on aspirin due to frequent ulcers.  Repeat cath for CP 09/2012 with patent LAD stent and otherwise normal coronary arteries  . Cervical stenosis of spine   . Depression   . Dyslipidemia   . Elbow fracture, right    history of- never any surgery  . GERD (gastroesophageal reflux disease)   . Hypertension   . Impaired physical mobility    due to rheumatoid arthritis "ambulates with walker" limited free standing.  . Macular degeneration   . Obesity   . Osteoporosis    alendronate since 2012  . PUD (peptic ulcer disease)    can not take aspirin  . PVC's (premature ventricular contractions)   . Rheumatoid arthritis(714.0)    Dr. Lenna Gilford follows  . Spondylolisthesis of lumbar region    BP 118/72 (BP Location: Right Arm, Patient Position: Sitting, Cuff Size:  Normal)   Pulse 80   Resp 14   SpO2 96%   Opioid Risk Score:   Fall Risk Score:  `1  Depression screen PHQ 2/9  Depression screen [facility] 2/9 07/04/2016 02/14/2015  Decreased Interest 0 2  Down, Depressed, Hopeless 1 2  PHQ - 2 Score 1 4  Altered sleeping - 2  Tired, decreased energy - 3  Change in appetite - 2  Feeling bad or failure about yourself  - 2  Trouble concentrating - 2  Moving slowly or fidgety/restless - 1  Suicidal thoughts - 0  PHQ-9 Score - 16    Review of Systems  Constitutional: Positive for unexpected weight change.  HENT: Negative.   Eyes: Negative.   Respiratory: Negative.   Cardiovascular: Positive for leg swelling.  Gastrointestinal: Positive for abdominal pain.  Endocrine: Negative.   Genitourinary: Positive for frequency and urgency.       Incontinence  Musculoskeletal: Positive for arthralgias, back pain, gait problem and neck pain.  Skin: Negative.   Allergic/Immunologic: Negative.   Neurological: Positive for dizziness and numbness.       Tingling  Hematological: Bruises/bleeds easily.  Psychiatric/Behavioral: The patient is nervous/anxious.   All other systems reviewed and are negative.      Objective:   Physical Exam  She does seem to have lost some weight..  Stable with walker  Heart: Regular rate  Chest: Normal effort  neurological: She has normal reflexes. No cranial nerve deficit or sensory deficit.  Motor function appears at baseline. Balance improved  Skin: vasculitic lesions.  M/S: low back tender to palpation. Needs extra time to stand. Posture is head forward, kyphotic.   Lower extremity edema proximally 1+ in bilateral limbs.  She can abduct the right shoulder almost to 90 degrees.  Left shoulder was limited to approximately 70 degrees.  She had pain in both shoulders with internal and external rotation. Psychiatric: Pleasant and appropriate   Assessment & Plan:  ASSESSMENT:  1. History of cervical stenosis/spondylosis  with myelopathy.  2. Rheumatoid arthritis.  3. Lumbar spondylolisthesis.  4. Gait disorder related to the above, 5. LE edema.    PLAN:  1. She was given one refill prescription for Percocet 7.5/325 1 p.o. q.6  hours p.r.n., 120. Second Rx was provided for next month We will continue the opioid monitoring program, this consists of regular clinic visits, examinations, routine drug screening, pill counts as well as use of [place] Controlled Substance Reporting System. [place] today.   2. Continue with lidoderm and voltaren gel.  3.  Reviewed potential shoulder exercises.  A packet of exercises was handed  out.  Consider left shoulder injection next month.  She will call if she is having any problems. 4.   Still recommend velcro compression stockings for legs.   Follow up in 2 months. 15 minutes of face to face patient care time were spent during this visit. All questions were encouraged and answered.

## 2017-09-18 NOTE — Patient Instructions (Signed)
PLEASE FEEL FREE TO CALL OUR OFFICE WITH ANY PROBLEMS OR QUESTIONS ([phone])      

## 2017-09-23 ENCOUNTER — Ambulatory Visit
Admission: RE | Admit: 2017-09-23 | Discharge: 2017-09-23 | Disposition: A | Discharge status: Home / Self Care | Payer: Medicare Other | Source: Ambulatory Visit | Attending: Internal Medicine | Admitting: Internal Medicine

## 2017-09-23 DIAGNOSIS — Z1231 Encounter for screening mammogram for malignant neoplasm of breast: Secondary | ICD-10-CM | POA: Diagnosis not present

## 2017-10-22 ENCOUNTER — Ambulatory Visit (INDEPENDENT_AMBULATORY_CARE_PROVIDER_SITE_OTHER): Payer: Medicare Other | Admitting: Cardiovascular Disease

## 2017-10-22 ENCOUNTER — Encounter: Payer: Self-pay | Admitting: Cardiovascular Disease

## 2017-10-22 VITALS — BP 96/64 | HR 67 | Ht 62.0 in | Wt 112.8 lb

## 2017-10-22 DIAGNOSIS — I350 Nonrheumatic aortic (valve) stenosis: Secondary | ICD-10-CM | POA: Diagnosis not present

## 2017-10-22 DIAGNOSIS — I2510 Atherosclerotic heart disease of native coronary artery without angina pectoris: Secondary | ICD-10-CM

## 2017-10-22 DIAGNOSIS — I480 Paroxysmal atrial fibrillation: Secondary | ICD-10-CM | POA: Diagnosis not present

## 2017-10-22 NOTE — Progress Notes (Signed)
Cardiology Office Note:    Date:  10/22/2017   ID:  [name], DOB 11/14/1935, MRN [id number]  PCP:  [name], MD  Cardiologist:  Dr. Fransico Him  >>    [name]  Electrophysiologist:  n/a  Chief Complaint  Patient presents with  . Atrial Fibrillation   Problem List 1. Paroxysmal atrial fib 2. CAD - PCI to LAD  3. Rheumatiod arthritis  4.HTN  5. Mild pulmonary HTN  - never smoked  6. Aortic stenosis  - moderate AS    History of Present Illness:     Angela [name] is a 81 y.o. female with a hx of CAD, s/p prior PCI to the LAD, HL, HTN, PVCs, rheumatoid arthritis, peptic ulcer disease, aortic stenosis.  She was seen for bradycardia and Holter in 8/15 showed NSR with PACs and PVCs and ATach.  Toprol was changed to Acebutolol.  Bradycardia resolved.  Last seen by Dr. Fransico Him in 4/16.  Prior echo suggested mod AS.  Repeat limited echo demonstrated just mild AS.   I saw her in 2/17 with increased swelling in her feet.  I felt her LE edema was multifactorial and related to venous insufficiency, inactivity, diastolic dysfunction.  I asked her to take extra Furosemide prn, wear compression stockings and to keep her legs elevated.  Admitted 3/17 with gastric outlet obstruction noted on EGD.  She had an NG tube placed and underwent EGD demonstrating a polypoid lesion in the pyloric channel with surrounding ulcer and edema.  Bx was neg for malignancy.  Hospital course was c/b AFlutter with RVR and she was seen by Cardiology.  Echo demonstrated mod AS, mod TR, EF 45%, LAE.  Acebutolol was increased to 400 bid.  She converted to NSR.   She also had episodes of PSVT. She was given prn Propranolol to use.  CHADS2-VASc=6 (CHF, vascular dz, female, age, HTN).  She was placed on Apixaban.    Last seen by me 01/30/16.  She was in NSR.  I decreased her Acebutolol due to low BP.      Since her Myoview, she has had no further chest pain.  She feels fatigued and dizzy at times.   Denies any significant change in her breathing.  Denies syncope, PND.  LE edema is chronic and stable.    Mar 21, 2016:  Angela [name] is seen today .   I saw her in the hospital for an episode of PAF that occurred during an admission for small bowel obstruction . Has had some dizziness.    Not eating well Has lost some weight - has lost 30-40 lbs over the past year.   Her acebutolol dose is 400 mg in the AM, 200 mg in the PM   May 25, 2016:  Had TIA like symptoms 2 days ago Blurry vision in the right eye. , no weakness in her arms and legs.   Was seen by EMS  She did not go to the hospital . HR is irregular  (has PVCs on monitor )  Has had some leg swelling .  Recommended leg elevation and compression hose  ( cant put on the compression hose due to the RA in her hands )   Had a cath in may,   Normal LV function, Patent LAD stent  Echo :  EF 45% , mild pulmonary HTN, mild MR   We decreased her Acebutolol to 200 mg a day and she feels better   January 17, 2017:  Has not felt that well over the past several months. Had a GI bug that lasted several weeks.  Has had some bilateral feet swelling .  Has to eat soft foods - lots of soup several times a week.  Feet have been swelling for a year or so   Dec. 18, 2018:  Doing well from a cardiac standpoint.   [place] [address] and has a bruise on her left leg   Past Medical History:  Diagnosis Date  . A-fib ([place])   . Aortic stenosis    mild by echo 03/2012  . Asthma   . CAD (coronary artery disease)    s/p PCI with BMS to mid LAD--2/08 not on aspirin due to frequent ulcers.  Repeat cath for CP 09/2012 with patent LAD stent and otherwise normal coronary arteries  . Cervical stenosis of spine   . Depression   . Dyslipidemia   . Elbow fracture, right    history of- never any surgery  . GERD (gastroesophageal reflux disease)   . Hypertension   . Impaired physical mobility    due to rheumatoid arthritis "ambulates with walker" limited free  standing.  . Macular degeneration   . Obesity   . Osteoporosis    alendronate since 2012  . PUD (peptic ulcer disease)    can not take aspirin  . PVC's (premature ventricular contractions)   . Rheumatoid arthritis(714.0)    Dr. Lenna Gilford follows  . Spondylolisthesis of lumbar region     Past Surgical History:  Procedure Laterality Date  . ANKLE FUSION Bilateral    x2 both  . CARDIAC CATHETERIZATION  09/2012   patent LAD stent and otherwise normal coronary arteries  . CARDIAC CATHETERIZATION N/A 03/07/2016   Procedure: Left Heart Cath and Coronary Angiography;  Surgeon: Peter M Martinique, MD;  Location: [facility];  Service: Cardiovascular;  Laterality: N/A;  . CATARACT EXTRACTION, BILATERAL Bilateral   . CERVICAL FUSION     limited  ROM  . CHOLECYSTECTOMY    . ESOPHAGOGASTRODUODENOSCOPY N/A 01/07/2016   Procedure: ESOPHAGOGASTRODUODENOSCOPY (EGD);  Surgeon: Wilford Corner, MD;  Location: [facility];  Service: Endoscopy;  Laterality: N/A;  . ESOPHAGOGASTRODUODENOSCOPY (EGD) WITH PROPOFOL N/A 11/01/2015   Procedure: ESOPHAGOGASTRODUODENOSCOPY (EGD) WITH PROPOFOL w/ Dialtation;  Surgeon: Garlan Fair, MD;  Location: [facility];  Service: Endoscopy;  Laterality: N/A;  . FRACTURE SURGERY  09/2011   left ankle screw and pin removed  . HAND SURGERY Bilateral    x4 digits 2-5 both hands  . JOINT REPLACEMENT Bilateral    BTKA  . TONSILLECTOMY      Current Medications: Outpatient Medications Prior to Visit  Medication Sig Dispense Refill  . acebutolol (SECTRAL) 200 MG capsule Take 1 capsule (200 mg total) by mouth every morning. 90 capsule 3  . apixaban (ELIQUIS) 5 MG TABS tablet Take 1 tablet (5 mg total) by mouth 2 (two) times daily. 180 tablet 3  . butalbital-acetaminophen-caffeine (FIORICET, ESGIC) 50-325-40 MG per tablet Take 1 tablet by mouth every 6 (six) hours as needed for headache or migraine. For migraines    . Calcium Carbonate-Vitamin D (CALCIUM 500+D PO) Take 1  tablet by mouth 2 (two) times daily.     . DENTAGEL 1.1 % GEL dental gel Place 1 application onto teeth at bedtime.     . diclofenac sodium (VOLTAREN) 1 % GEL Apply 2 g topically 2 (two) times daily as needed (for pain). For pain 3 Tube 5  . digoxin (LANOXIN) 0.125 MG tablet  Take 1 tablet (0.125 mg total) by mouth daily. 90 tablet 3  . Docusate Calcium (STOOL SOFTENER PO) Take 1 tablet by mouth at bedtime.    Marland Kitchen escitalopram (LEXAPRO) 5 MG tablet TAKE 1 TABLET BY MOUTH AT BEDTIME 90 tablet 1  . fluticasone (FLONASE) 50 MCG/ACT nasal spray Place 1 spray into both nostrils daily as needed for allergies.   0  . folic acid (FOLVITE) 1 MG tablet Take 1 mg by mouth daily.     . furosemide (LASIX) 40 MG tablet TAKE 1 TABLET BY MOUTH EVERY DAY TAKE 1/2 TAB AS NEEDED FOR EXTRA SWELLING 45 tablet 7  . lidocaine (LIDODERM) 5 % Place 3 patches onto the skin every 12 (twelve) hours. APPLY NECK AND ELBOW 90 patch 5  . LORazepam (ATIVAN) 2 MG tablet Take 2 mg by mouth at bedtime.  5  . methotrexate (RHEUMATREX) 2.5 MG tablet Take 15 mg by mouth every Saturday.     . nitroGLYCERIN (NITROSTAT) 0.4 MG SL tablet Place 1 tablet (0.4 mg total) under the tongue every 5 (five) minutes as needed for chest pain. 25 tablet 1  . nystatin cream (MYCOSTATIN) Apply 1 application topically daily as needed for dry skin.    Marland Kitchen oxyCODONE-acetaminophen (PERCOCET) 7.5-325 MG tablet Take 1 tablet every 6 (six) hours as needed by mouth for moderate pain. 120 tablet 0  . pantoprazole (PROTONIX) 40 MG tablet Take 40 mg by mouth 2 (two) times daily.     . predniSONE (DELTASONE) 5 MG tablet Take 5 mg by mouth daily with breakfast.     . PREVIDENT 0.2 % SOLN Take 1 each by mouth at bedtime. Rinses with about 1 capful nightly    . propranolol (INDERAL) 10 MG tablet Take 1 tablet (10 mg total) by mouth daily as needed (palpitations lasting more than 5 minutes). 30 tablet 0  . simvastatin (ZOCOR) 40 MG tablet TAKE 1 TABLET BY MOUTH AT BEDTIME  90 tablet 3  . TOVIAZ 8 MG TB24 tablet Take 8 mg by mouth daily.     No facility-administered medications prior to visit.      Allergies:   Peanut-containing drug products; Reglan [metoclopramide]; Clindamycin/lincomycin; Codeine; Iohexol; Pineapple; and Shellfish allergy   Social History   Socioeconomic History  . Marital status: Widowed    Spouse name: None  . Number of children: None  . Years of education: None  . Highest education level: None  Social Needs  . Financial resource strain: None  . Food insecurity - worry: None  . Food insecurity - inability: None  . Transportation needs - medical: None  . Transportation needs - non-medical: None  Occupational History  . None  Tobacco Use  . Smoking status: Never Smoker  . Smokeless tobacco: Never Used  Substance and Sexual Activity  . Alcohol use: No  . Drug use: No  . Sexual activity: None  Other Topics Concern  . None  Social History Narrative  . None     Family History:  The patient's family history includes Arrhythmia in her mother; Lung cancer in her mother; Melanoma in her brother.   Review of systems: As noted in the current history, otherwise negative   Physical Exam: Blood pressure 96/64, pulse 67, height 5\' 2"  (1.575 m), weight 112 lb 12.8 oz (51.2 kg), SpO2 97 %.  GEN:  Well nourished, well developed in no acute distress HEENT: Normal NECK: No JVD; No carotid bruits LYMPHATICS: No lymphadenopathy CARDIAC: RR,  Soft systolic murmur  rubs, gallops RESPIRATORY:  Clear to auscultation without rales, wheezing or rhonchi  ABDOMEN: Soft, non-tender, non-distended MUSCULOSKELETAL:  No edema; No deformity  SKIN: Warm and dry NEUROLOGIC:  Alert and oriented x 3   Wt Readings from Last 3 Encounters:  10/22/17 112 lb 12.8 oz (51.2 kg)  07/31/17 111 lb (50.3 kg)  01/17/17 113 lb 1.9 oz (51.3 kg)      Studies/Labs Reviewed:     EKG:     Recent Labs: 07/31/2017: BUN 9; Creatinine, Ser 0.48; Potassium  3.6; Sodium 138; TSH 2.350   Recent Lipid Panel    Component Value Date/Time   CHOL 129 02/24/2015 1030   TRIG 81 01/09/2016 0515   HDL 58.80 02/24/2015 1030   CHOLHDL 2 02/24/2015 1030   VLDL 17.6 02/24/2015 1030   LDLCALC 53 02/24/2015 1030    Additional studies/ records that were reviewed today include:     ASSESSMENT:     No diagnosis found.  PLAN:      :  1. PAF/Flutter -   .  She remains in normal sinus rhythm today.  Continue Eliquis.  She did have a fall earlier this month.  If she continues to have issues with falling we may need to discontinue the oral anticoagulation.  2. SVT - No apparent recurrence.   3. Cardiomyopathy - stable    4. CAD -    5. Aortic stenosis - Mod by recent echo.      6. HTN -     7. Edema -    8. Possible TIA:       Mertie Moores, MD  10/22/2017 2:06 PM    [address],  [address] [place], [place]  [address] Pager [phone] Phone: [phone]; Fax: [phone]

## 2017-10-22 NOTE — Patient Instructions (Addendum)
.  Medication Instructions:  Your physician has recommended you make the following change in your medication:  STOP Digoxin   Labwork: None Ordered   Testing/Procedures: None Ordered   Follow-Up: Your physician wants you to follow-up in: 6 months with Dr. Acie Fredrickson.  You will receive a reminder letter in the mail two months in advance. If you don't receive a letter, please call our office to schedule the follow-up appointment.   If you need a refill on your cardiac medications before your next appointment, please call your pharmacy.   Thank you for choosing [facility]! Christen Bame, RN [phone]     For your  leg edema you  should do  the following 1. Leg elevation - I recommend the Lounge Dr. Leg rest.  See below for details  2. Salt restriction  -  Use potassium chloride instead of regular salt as a salt substitute. 3. Walk regularly 4. Compression hose - [facility] 5. Weight loss    Available on [email or web address]  Go to [email or web address]

## 2017-11-01 ENCOUNTER — Other Ambulatory Visit: Payer: Self-pay | Admitting: Registered Nurse

## 2017-11-01 DIAGNOSIS — M05712 Rheumatoid arthritis with rheumatoid factor of left shoulder without organ or systems involvement: Principal | ICD-10-CM

## 2017-11-01 DIAGNOSIS — M05711 Rheumatoid arthritis with rheumatoid factor of right shoulder without organ or systems involvement: Secondary | ICD-10-CM

## 2017-11-01 DIAGNOSIS — M4316 Spondylolisthesis, lumbar region: Secondary | ICD-10-CM

## 2017-11-06 ENCOUNTER — Telehealth: Payer: Self-pay | Admitting: Cardiovascular Disease

## 2017-11-06 NOTE — Telephone Encounter (Signed)
New Message  Patient calling the office for samples of medication:   1.  What medication and dosage are you requesting samples for? Eliquis 5mg   2.  Are you currently out of this medication? Yes   Patient also would like an application to receive help paying for her medication

## 2017-11-07 NOTE — Telephone Encounter (Signed)
Called pt to inform her that we did not have any samples of Eliquis and that pt could call back tomorrow, to see if we have gotten any in. Pt ask for a application for eliquis be mailed to her. I put a application for eliquis assistance in the mail for pt to fill out and return to [employer], LPN, nurse that handles the application. Pt verbalized understanding.

## 2017-12-02 ENCOUNTER — Encounter: Payer: Medicare Other | Attending: Registered Nurse | Admitting: Physical Medicine & Rehabilitation

## 2017-12-02 ENCOUNTER — Encounter: Payer: Self-pay | Admitting: Physical Medicine & Rehabilitation

## 2017-12-02 VITALS — BP 82/56 | HR 73 | Resp 14

## 2017-12-02 DIAGNOSIS — G8929 Other chronic pain: Secondary | ICD-10-CM | POA: Diagnosis not present

## 2017-12-02 DIAGNOSIS — M4712 Other spondylosis with myelopathy, cervical region: Secondary | ICD-10-CM | POA: Diagnosis not present

## 2017-12-02 DIAGNOSIS — M25521 Pain in right elbow: Secondary | ICD-10-CM | POA: Diagnosis not present

## 2017-12-02 DIAGNOSIS — Z5181 Encounter for therapeutic drug level monitoring: Secondary | ICD-10-CM

## 2017-12-02 DIAGNOSIS — G894 Chronic pain syndrome: Secondary | ICD-10-CM

## 2017-12-02 DIAGNOSIS — M25512 Pain in left shoulder: Secondary | ICD-10-CM | POA: Insufficient documentation

## 2017-12-02 DIAGNOSIS — Z760 Encounter for issue of repeat prescription: Secondary | ICD-10-CM | POA: Diagnosis not present

## 2017-12-02 DIAGNOSIS — F329 Major depressive disorder, single episode, unspecified: Secondary | ICD-10-CM | POA: Diagnosis not present

## 2017-12-02 DIAGNOSIS — F419 Anxiety disorder, unspecified: Secondary | ICD-10-CM | POA: Insufficient documentation

## 2017-12-02 DIAGNOSIS — R42 Dizziness and giddiness: Secondary | ICD-10-CM | POA: Diagnosis not present

## 2017-12-02 DIAGNOSIS — M069 Rheumatoid arthritis, unspecified: Secondary | ICD-10-CM | POA: Diagnosis not present

## 2017-12-02 DIAGNOSIS — M05712 Rheumatoid arthritis with rheumatoid factor of left shoulder without organ or systems involvement: Secondary | ICD-10-CM | POA: Diagnosis not present

## 2017-12-02 DIAGNOSIS — Z79899 Other long term (current) drug therapy: Secondary | ICD-10-CM

## 2017-12-02 DIAGNOSIS — M4802 Spinal stenosis, cervical region: Secondary | ICD-10-CM | POA: Diagnosis not present

## 2017-12-02 DIAGNOSIS — M25522 Pain in left elbow: Secondary | ICD-10-CM | POA: Diagnosis not present

## 2017-12-02 DIAGNOSIS — M545 Low back pain: Secondary | ICD-10-CM | POA: Diagnosis not present

## 2017-12-02 DIAGNOSIS — M4316 Spondylolisthesis, lumbar region: Secondary | ICD-10-CM

## 2017-12-02 DIAGNOSIS — M25511 Pain in right shoulder: Secondary | ICD-10-CM | POA: Diagnosis not present

## 2017-12-02 DIAGNOSIS — M05711 Rheumatoid arthritis with rheumatoid factor of right shoulder without organ or systems involvement: Secondary | ICD-10-CM

## 2017-12-02 DIAGNOSIS — R200 Anesthesia of skin: Secondary | ICD-10-CM | POA: Diagnosis not present

## 2017-12-02 HISTORY — DX: Chronic pain syndrome: G89.4

## 2017-12-02 MED ORDER — OXYCODONE-ACETAMINOPHEN 7.5-325 MG PO TABS: 1.0000 | ORAL_TABLET | Freq: Four times a day (QID) | ORAL | 0 refills | Status: DC | PRN

## 2017-12-02 NOTE — Patient Instructions (Signed)
PLEASE FEEL FREE TO CALL OUR OFFICE WITH ANY PROBLEMS OR QUESTIONS ([phone])      

## 2017-12-02 NOTE — Progress Notes (Signed)
Subjective:    Patient ID: Angela [name], female    DOB: 1936-05-21, 82 y.o.   MRN: [id number]  HPI  Angela [name] is here in follow up of her chronic pain. She has been doing fairly well. She got a little "catch" in her neck last night and her neck is bothering her more today. She continues on the lidoderm patches and voltaren gel. Her percocet remains generally effective for her daily pain. She uses four per day on avg.    She continues to see rheumatology still for her RA. Her right shoulder has been acting up more as of late. She struggles with it when she sleeps on that side. She is doing the exercises we described in the office last November. She reports no changes in her health status.       Pain Inventory Average Pain 5 Pain Right Now 9 My pain is burning and dull  In the last 24 hours, has pain interfered with the following? General activity 8 Relation with others 5 Enjoyment of life 7 What TIME of day is your pain at its worst? night Sleep (in general) Good  Pain is worse with: walking, bending and unsure Pain improves with: heat/ice and medication Relief from Meds: 9  Mobility walk with assistance use a walker how many minutes can you walk? 5 ability to climb steps?  no do you drive?  no Do you have any goals in this area?  yes  Function disabled: date disabled . I need assistance with the following:  dressing, bathing, household duties and shopping  Neuro/Psych bladder control problems bowel control problems numbness anxiety  Prior Studies Any changes since last visit?  no  Physicians involved in your care Any changes since last visit?  no   Family History  Problem Relation Age of Onset  . Lung cancer Mother   . Arrhythmia Mother   . Melanoma Brother   . Heart attack Neg Hx    Social History   Socioeconomic History  . Marital status: Widowed    Spouse name: None  . Number of children: None  . Years of education: None  . Highest education  level: None  Social Needs  . Financial resource strain: None  . Food insecurity - worry: None  . Food insecurity - inability: None  . Transportation needs - medical: None  . Transportation needs - non-medical: None  Occupational History  . None  Tobacco Use  . Smoking status: Never Smoker  . Smokeless tobacco: Never Used  Substance and Sexual Activity  . Alcohol use: No  . Drug use: No  . Sexual activity: None  Other Topics Concern  . None  Social History Narrative  . None   Past Surgical History:  Procedure Laterality Date  . ANKLE FUSION Bilateral    x2 both  . CARDIAC CATHETERIZATION  09/2012   patent LAD stent and otherwise normal coronary arteries  . CARDIAC CATHETERIZATION N/A 03/07/2016   Procedure: Left Heart Cath and Coronary Angiography;  Surgeon: Peter M Martinique, MD;  Location: [facility];  Service: Cardiovascular;  Laterality: N/A;  . CATARACT EXTRACTION, BILATERAL Bilateral   . CERVICAL FUSION     limited  ROM  . CHOLECYSTECTOMY    . ESOPHAGOGASTRODUODENOSCOPY N/A 01/07/2016   Procedure: ESOPHAGOGASTRODUODENOSCOPY (EGD);  Surgeon: Wilford Corner, MD;  Location: [facility];  Service: Endoscopy;  Laterality: N/A;  . ESOPHAGOGASTRODUODENOSCOPY (EGD) WITH PROPOFOL N/A 11/01/2015   Procedure: ESOPHAGOGASTRODUODENOSCOPY (EGD) WITH PROPOFOL w/ Dialtation;  Surgeon: Garlan Fair, MD;  Location: [facility];  Service: Endoscopy;  Laterality: N/A;  . FRACTURE SURGERY  09/2011   left ankle screw and pin removed  . HAND SURGERY Bilateral    x4 digits 2-5 both hands  . JOINT REPLACEMENT Bilateral    BTKA  . TONSILLECTOMY     Past Medical History:  Diagnosis Date  . A-fib ([place])   . Aortic stenosis    mild by echo 03/2012  . Asthma   . CAD (coronary artery disease)    s/p PCI with BMS to mid LAD--2/08 not on aspirin due to frequent ulcers.  Repeat cath for CP 09/2012 with patent LAD stent and otherwise normal coronary arteries  . Cervical stenosis of  spine   . Depression   . Dyslipidemia   . Elbow fracture, right    history of- never any surgery  . GERD (gastroesophageal reflux disease)   . Hypertension   . Impaired physical mobility    due to rheumatoid arthritis "ambulates with walker" limited free standing.  . Macular degeneration   . Obesity   . Osteoporosis    alendronate since 2012  . PUD (peptic ulcer disease)    can not take aspirin  . PVC's (premature ventricular contractions)   . Rheumatoid arthritis(714.0)    Dr. Lenna Gilford follows  . Spondylolisthesis of lumbar region    BP (!) 82/56 (BP Location: Left Arm, Patient Position: Sitting, Cuff Size: Normal)   Pulse 73   Resp 14   SpO2 96%   Opioid Risk Score:   Fall Risk Score:  `1  Depression screen PHQ 2/9  Depression screen [facility] 2/9 07/04/2016 02/14/2015  Decreased Interest 0 2  Down, Depressed, Hopeless 1 2  PHQ - 2 Score 1 4  Altered sleeping - 2  Tired, decreased energy - 3  Change in appetite - 2  Feeling bad or failure about yourself  - 2  Trouble concentrating - 2  Moving slowly or fidgety/restless - 1  Suicidal thoughts - 0  PHQ-9 Score - 16    Review of Systems  Constitutional: Positive for unexpected weight change.  HENT: Negative.   Eyes: Negative.   Respiratory: Negative.   Cardiovascular: Positive for leg swelling.  Gastrointestinal: Positive for abdominal pain and constipation.  Endocrine: Negative.   Genitourinary: Negative.   Musculoskeletal: Positive for arthralgias, back pain, gait problem, neck pain and neck stiffness.  Allergic/Immunologic: Negative.   Neurological: Positive for numbness.  Hematological: Negative.   Psychiatric/Behavioral: Negative.        Objective:   Physical Exam Weight is stable    Heart: RRR  Chest: normal effort neurological: She has normal reflexes. No cranial nerve deficit or sensory deficit.  Motor function appears at baseline. Balance improved  Skin: vasculitic lesions.  M/S: low back tender to  palpation. Needs extra time to stand. Posture is head forward, kyphotic.   both shoulders limited with ER/IR. Psychiatric: Pleasant and appropriate   Assessment & Plan:  ASSESSMENT:  1. History of cervical stenosis/spondylosis with myelopathy.  2. Rheumatoid arthritis.  3. Lumbar spondylolisthesis.  4. Gait disorder related to the above, 5. LE edema.   PLAN:  1. She was givenone refillprescription for Percocet 7.5/325 1 p.o. q.6  hours p.r.n., 120. Second Rx was provided for next month We will continue the opioid monitoring program, this consists of regular clinic visits, examinations, routine drug screening, pill counts as well as use of [place] Controlled Substance Reporting System. [place]  today.  .    -drug swab today 2. Continue with lidoderm and voltaren gel.  3. continue HEP.  Shoulder injection. 4.  Still recommend velcro compression stockings for legs. Hold lasix prn for low bp, light-headedness.     Follow up in 2 months with me or NP.50minutes of face to face patient care time were spent during this visit. All questions were encouraged and answered.

## 2017-12-03 DIAGNOSIS — Z79899 Other long term (current) drug therapy: Secondary | ICD-10-CM | POA: Diagnosis not present

## 2017-12-03 DIAGNOSIS — Z681 Body mass index (BMI) 19 or less, adult: Secondary | ICD-10-CM | POA: Diagnosis not present

## 2017-12-03 DIAGNOSIS — K5903 Drug induced constipation: Secondary | ICD-10-CM | POA: Diagnosis not present

## 2017-12-03 DIAGNOSIS — R1030 Lower abdominal pain, unspecified: Secondary | ICD-10-CM | POA: Diagnosis not present

## 2017-12-03 DIAGNOSIS — M0579 Rheumatoid arthritis with rheumatoid factor of multiple sites without organ or systems involvement: Secondary | ICD-10-CM | POA: Diagnosis not present

## 2017-12-03 DIAGNOSIS — M2550 Pain in unspecified joint: Secondary | ICD-10-CM | POA: Diagnosis not present

## 2017-12-03 DIAGNOSIS — M25511 Pain in right shoulder: Secondary | ICD-10-CM | POA: Diagnosis not present

## 2017-12-03 DIAGNOSIS — I950 Idiopathic hypotension: Secondary | ICD-10-CM | POA: Diagnosis not present

## 2017-12-10 LAB — DRUG TOX MONITOR 1 W/CONF, ORAL FLD
Alprazolam: NEGATIVE ng/mL (ref <0.50)
Amphetamines: NEGATIVE ng/mL (ref <10)
Barbiturates: NEGATIVE ng/mL (ref <10)
Benzodiazepines: POSITIVE ng/mL — AB (ref <0.50)
Buprenorphine: NEGATIVE ng/mL (ref <0.10)
CHLORDIAZEPOXIDE: NEGATIVE ng/mL (ref <0.50)
CLONAZEPAM: NEGATIVE ng/mL (ref <0.50)
COCAINE: NEGATIVE ng/mL (ref <5.0)
Codeine: NEGATIVE ng/mL (ref <2.5)
DIAZEPAM: NEGATIVE ng/mL (ref <0.50)
Dihydrocodeine: NEGATIVE ng/mL (ref <2.5)
FLURAZEPAM: NEGATIVE ng/mL (ref <0.50)
Fentanyl: NEGATIVE ng/mL (ref <0.10)
Flunitrazepam: NEGATIVE ng/mL (ref <0.50)
Heroin Metabolite: NEGATIVE ng/mL (ref <1.0)
Hydrocodone: NEGATIVE ng/mL (ref <2.5)
Hydromorphone: NEGATIVE ng/mL (ref <2.5)
LORAZEPAM: 1.53 ng/mL — AB (ref <0.50)
MARIJUANA: NEGATIVE ng/mL (ref <2.5)
MDMA: NEGATIVE ng/mL (ref <10)
METHADONE: NEGATIVE ng/mL (ref <5.0)
MIDAZOLAM: NEGATIVE ng/mL (ref <0.50)
MORPHINE: NEGATIVE ng/mL (ref <2.5)
Meprobamate: NEGATIVE ng/mL (ref <2.5)
NICOTINE METABOLITE: NEGATIVE ng/mL (ref <5.0)
NORHYDROCODONE: NEGATIVE ng/mL (ref <2.5)
Nordiazepam: NEGATIVE ng/mL (ref <0.50)
Noroxycodone: 19.8 ng/mL — ABNORMAL HIGH (ref <2.5)
OXAZEPAM: NEGATIVE ng/mL (ref <0.50)
OXYMORPHONE: NEGATIVE ng/mL (ref <2.5)
Opiates: POSITIVE ng/mL — AB (ref <2.5)
Phencyclidine: NEGATIVE ng/mL (ref <10)
TEMAZEPAM: NEGATIVE ng/mL (ref <0.50)
Tapentadol: NEGATIVE ng/mL (ref <5.0)
Tramadol: NEGATIVE ng/mL (ref <5.0)
Triazolam: NEGATIVE ng/mL (ref <0.50)
Zolpidem: NEGATIVE ng/mL (ref <5.0)

## 2017-12-10 LAB — DRUG TOX ALC METAB W/CON, ORAL FLD: Alcohol Metabolite: NEGATIVE ng/mL (ref <25)

## 2017-12-11 ENCOUNTER — Telehealth: Payer: Self-pay | Admitting: *Deleted

## 2017-12-11 NOTE — Telephone Encounter (Signed)
Oral swab drug screen was consistent for prescribed medications.  ?

## 2017-12-25 ENCOUNTER — Other Ambulatory Visit: Payer: Self-pay | Admitting: Gastroenterology

## 2017-12-25 DIAGNOSIS — K311 Adult hypertrophic pyloric stenosis: Secondary | ICD-10-CM | POA: Diagnosis not present

## 2017-12-31 ENCOUNTER — Other Ambulatory Visit: Payer: Self-pay | Admitting: Gastroenterology

## 2017-12-31 ENCOUNTER — Ambulatory Visit
Admission: RE | Admit: 2017-12-31 | Discharge: 2017-12-31 | Disposition: A | Discharge status: Home / Self Care | Payer: Medicare Other | Source: Ambulatory Visit | Attending: Gastroenterology | Admitting: Gastroenterology

## 2017-12-31 DIAGNOSIS — K311 Adult hypertrophic pyloric stenosis: Secondary | ICD-10-CM

## 2017-12-31 DIAGNOSIS — R933 Abnormal findings on diagnostic imaging of other parts of digestive tract: Secondary | ICD-10-CM | POA: Diagnosis not present

## 2018-01-02 ENCOUNTER — Other Ambulatory Visit: Payer: Self-pay | Admitting: Registered Nurse

## 2018-01-02 DIAGNOSIS — M4316 Spondylolisthesis, lumbar region: Secondary | ICD-10-CM

## 2018-01-02 DIAGNOSIS — M05712 Rheumatoid arthritis with rheumatoid factor of left shoulder without organ or systems involvement: Principal | ICD-10-CM

## 2018-01-02 DIAGNOSIS — M05711 Rheumatoid arthritis with rheumatoid factor of right shoulder without organ or systems involvement: Secondary | ICD-10-CM

## 2018-01-03 ENCOUNTER — Other Ambulatory Visit: Payer: Self-pay | Admitting: Gastroenterology

## 2018-01-08 DIAGNOSIS — D225 Melanocytic nevi of trunk: Secondary | ICD-10-CM | POA: Diagnosis not present

## 2018-01-08 DIAGNOSIS — L821 Other seborrheic keratosis: Secondary | ICD-10-CM | POA: Diagnosis not present

## 2018-01-08 DIAGNOSIS — D1801 Hemangioma of skin and subcutaneous tissue: Secondary | ICD-10-CM | POA: Diagnosis not present

## 2018-01-08 DIAGNOSIS — Z85828 Personal history of other malignant neoplasm of skin: Secondary | ICD-10-CM | POA: Diagnosis not present

## 2018-01-09 ENCOUNTER — Encounter (HOSPITAL_COMMUNITY): Payer: Self-pay | Admitting: *Deleted

## 2018-01-09 ENCOUNTER — Other Ambulatory Visit: Payer: Self-pay

## 2018-01-09 NOTE — Anesthesia Preprocedure Evaluation (Addendum)
Anesthesia Evaluation  Patient identified by MRN, date of birth, ID band Patient awake    Reviewed: Allergy & Precautions, NPO status , Patient's Chart, lab work & pertinent test results  History of Anesthesia Complications Negative for: history of anesthetic complications  Airway Mallampati: II  TM Distance: >3 FB Neck ROM: Full    Dental no notable dental hx. (+) Dental Advisory Given, Caps   Pulmonary asthma ,    Pulmonary exam normal        Cardiovascular hypertension, Pt. on medications + CAD and + Cardiac Stents  Normal cardiovascular exam  Conclusion    Prox LAD to Mid LAD lesion, 10% stenosed. The lesion was previously treated with a bare metal stent greater than two years ago.  The left ventricular systolic function is normal.   1. No obstructive CAD. Stent in the mid LAD is widely patent. 2. Good overall LV function. There is focal hypokinesis of the basal inferior wall that has been present since cardiac cath in 2005.      Neuro/Psych PSYCHIATRIC DISORDERS Depression negative neurological ROS     GI/Hepatic Neg liver ROS, PUD, GERD  ,  Endo/Other  negative endocrine ROS  Renal/GU Renal disease     Musculoskeletal  (+) Arthritis ,   Abdominal   Peds  Hematology negative hematology ROS (+)   Anesthesia Other Findings   Reproductive/Obstetrics negative OB ROS                            Anesthesia Physical  Anesthesia Plan  ASA: III  Anesthesia Plan: MAC   Post-op Pain Management:    Induction: Intravenous  PONV Risk Score and Plan: 2 and Ondansetron and Propofol infusion  Airway Management Planned: Natural Airway, Nasal Cannula and Simple Face Mask  Additional Equipment:   Intra-op Plan:   Post-operative Plan:   Informed Consent: I have reviewed the patients History and Physical, chart, labs and discussed the procedure including the risks, benefits and  alternatives for the proposed anesthesia with the patient or authorized representative who has indicated his/her understanding and acceptance.   Dental advisory given  Plan Discussed with: Anesthesiologist and CRNA  Anesthesia Plan Comments:        Anesthesia Quick Evaluation

## 2018-01-09 NOTE — Progress Notes (Addendum)
Pt denies any acute cardiopulmonary issues. Pt under the care of Dr. Julian Reil,. Pt made aware to stop taking vitamins, fish oil, Ginger, Turmeric and herbal medications. Do not take any NSAIDs ie: Ibuprofen, Advil, Naproxen (Aleve), Motrin, BC and Goody Powder. Pt stated that her last dose of Eliquis was Wednesday night. Pt stated that her Nitro expired. Pt verbalized understanding of all pre-op instructions. Dr. Conrad Dora, Anesthesia, reviewed pt cardiac note dated 10/22/17 in Epic; MD advised that pt be assess on DOS.

## 2018-01-10 ENCOUNTER — Ambulatory Visit (HOSPITAL_COMMUNITY): Payer: Medicare Other | Admitting: Certified Registered Nurse Anesthetist

## 2018-01-10 ENCOUNTER — Encounter (HOSPITAL_COMMUNITY): Payer: Self-pay | Admitting: *Deleted

## 2018-01-10 ENCOUNTER — Ambulatory Visit (HOSPITAL_COMMUNITY)
Admission: RE | Admit: 2018-01-10 | Discharge: 2018-01-10 | Disposition: A | Discharge status: Home / Self Care | Payer: Medicare Other | Source: Ambulatory Visit | Attending: Gastroenterology | Admitting: Gastroenterology

## 2018-01-10 ENCOUNTER — Other Ambulatory Visit: Payer: Self-pay

## 2018-01-10 ENCOUNTER — Encounter (HOSPITAL_COMMUNITY)
Admission: RE | Disposition: A | Discharge status: Home / Self Care | Payer: Self-pay | Source: Ambulatory Visit | Attending: Gastroenterology

## 2018-01-10 DIAGNOSIS — M069 Rheumatoid arthritis, unspecified: Secondary | ICD-10-CM | POA: Diagnosis not present

## 2018-01-10 DIAGNOSIS — Z880 Allergy status to penicillin: Secondary | ICD-10-CM | POA: Insufficient documentation

## 2018-01-10 DIAGNOSIS — Z79899 Other long term (current) drug therapy: Secondary | ICD-10-CM | POA: Insufficient documentation

## 2018-01-10 DIAGNOSIS — F329 Major depressive disorder, single episode, unspecified: Secondary | ICD-10-CM | POA: Insufficient documentation

## 2018-01-10 DIAGNOSIS — K259 Gastric ulcer, unspecified as acute or chronic, without hemorrhage or perforation: Secondary | ICD-10-CM | POA: Diagnosis not present

## 2018-01-10 DIAGNOSIS — R32 Unspecified urinary incontinence: Secondary | ICD-10-CM | POA: Insufficient documentation

## 2018-01-10 DIAGNOSIS — R1310 Dysphagia, unspecified: Secondary | ICD-10-CM | POA: Diagnosis not present

## 2018-01-10 DIAGNOSIS — I4892 Unspecified atrial flutter: Secondary | ICD-10-CM | POA: Diagnosis not present

## 2018-01-10 DIAGNOSIS — I2510 Atherosclerotic heart disease of native coronary artery without angina pectoris: Secondary | ICD-10-CM | POA: Diagnosis not present

## 2018-01-10 DIAGNOSIS — J45909 Unspecified asthma, uncomplicated: Secondary | ICD-10-CM | POA: Diagnosis not present

## 2018-01-10 DIAGNOSIS — Z888 Allergy status to other drugs, medicaments and biological substances status: Secondary | ICD-10-CM | POA: Insufficient documentation

## 2018-01-10 DIAGNOSIS — I493 Ventricular premature depolarization: Secondary | ICD-10-CM | POA: Insufficient documentation

## 2018-01-10 DIAGNOSIS — K311 Adult hypertrophic pyloric stenosis: Secondary | ICD-10-CM | POA: Insufficient documentation

## 2018-01-10 DIAGNOSIS — K228 Other specified diseases of esophagus: Secondary | ICD-10-CM | POA: Diagnosis not present

## 2018-01-10 DIAGNOSIS — K219 Gastro-esophageal reflux disease without esophagitis: Secondary | ICD-10-CM | POA: Insufficient documentation

## 2018-01-10 DIAGNOSIS — I350 Nonrheumatic aortic (valve) stenosis: Secondary | ICD-10-CM | POA: Insufficient documentation

## 2018-01-10 DIAGNOSIS — R7302 Impaired glucose tolerance (oral): Secondary | ICD-10-CM | POA: Insufficient documentation

## 2018-01-10 DIAGNOSIS — K3184 Gastroparesis: Secondary | ICD-10-CM | POA: Diagnosis not present

## 2018-01-10 DIAGNOSIS — Z7901 Long term (current) use of anticoagulants: Secondary | ICD-10-CM | POA: Diagnosis not present

## 2018-01-10 DIAGNOSIS — I119 Hypertensive heart disease without heart failure: Secondary | ICD-10-CM | POA: Diagnosis not present

## 2018-01-10 DIAGNOSIS — K2980 Duodenitis without bleeding: Secondary | ICD-10-CM | POA: Diagnosis not present

## 2018-01-10 DIAGNOSIS — Z96653 Presence of artificial knee joint, bilateral: Secondary | ICD-10-CM | POA: Diagnosis not present

## 2018-01-10 DIAGNOSIS — E785 Hyperlipidemia, unspecified: Secondary | ICD-10-CM | POA: Diagnosis not present

## 2018-01-10 DIAGNOSIS — Z8711 Personal history of peptic ulcer disease: Secondary | ICD-10-CM | POA: Insufficient documentation

## 2018-01-10 DIAGNOSIS — Z91018 Allergy to other foods: Secondary | ICD-10-CM | POA: Insufficient documentation

## 2018-01-10 DIAGNOSIS — Z981 Arthrodesis status: Secondary | ICD-10-CM | POA: Diagnosis not present

## 2018-01-10 DIAGNOSIS — R5383 Other fatigue: Secondary | ICD-10-CM | POA: Insufficient documentation

## 2018-01-10 DIAGNOSIS — Z91013 Allergy to seafood: Secondary | ICD-10-CM | POA: Diagnosis not present

## 2018-01-10 DIAGNOSIS — Z881 Allergy status to other antibiotic agents status: Secondary | ICD-10-CM | POA: Insufficient documentation

## 2018-01-10 DIAGNOSIS — Z808 Family history of malignant neoplasm of other organs or systems: Secondary | ICD-10-CM | POA: Insufficient documentation

## 2018-01-10 DIAGNOSIS — Z885 Allergy status to narcotic agent status: Secondary | ICD-10-CM | POA: Insufficient documentation

## 2018-01-10 DIAGNOSIS — H3530 Unspecified macular degeneration: Secondary | ICD-10-CM | POA: Diagnosis not present

## 2018-01-10 DIAGNOSIS — Z91010 Allergy to peanuts: Secondary | ICD-10-CM | POA: Insufficient documentation

## 2018-01-10 HISTORY — DX: Headache: R51

## 2018-01-10 HISTORY — PX: ESOPHAGOGASTRODUODENOSCOPY: SHX5428

## 2018-01-10 HISTORY — DX: Rheumatoid arthritis, unspecified: M06.9

## 2018-01-10 HISTORY — DX: Headache, unspecified: R51.9

## 2018-01-10 HISTORY — PX: BALLOON DILATION: SHX5330

## 2018-01-10 SURGERY — EGD (ESOPHAGOGASTRODUODENOSCOPY)
Anesthesia: Monitor Anesthesia Care

## 2018-01-10 MED ORDER — SODIUM CHLORIDE 0.9 % IV SOLN: INTRAVENOUS | Status: DC

## 2018-01-10 MED ORDER — BUTAMBEN-TETRACAINE-BENZOCAINE 2-2-14 % EX AERO
INHALATION_SPRAY | CUTANEOUS | Status: DC | PRN
  Administered 2018-01-10: 2 via TOPICAL

## 2018-01-10 MED ORDER — LACTATED RINGERS IV SOLN
INTRAVENOUS | Status: DC
  Administered 2018-01-10: 08:00:00 via INTRAVENOUS

## 2018-01-10 MED ORDER — ONDANSETRON HCL 4 MG/2ML IJ SOLN
INTRAMUSCULAR | Status: DC | PRN
  Administered 2018-01-10: 4 mg via INTRAVENOUS

## 2018-01-10 MED ORDER — PROPOFOL 10 MG/ML IV BOLUS
INTRAVENOUS | Status: DC | PRN
  Administered 2018-01-10: 20 mg via INTRAVENOUS
  Administered 2018-01-10: 40 mg via INTRAVENOUS

## 2018-01-10 MED ORDER — LIDOCAINE 2% (20 MG/ML) 5 ML SYRINGE
INTRAMUSCULAR | Status: DC | PRN
  Administered 2018-01-10: 60 mg via INTRAVENOUS

## 2018-01-10 MED ORDER — PROPOFOL 500 MG/50ML IV EMUL
INTRAVENOUS | Status: DC | PRN
  Administered 2018-01-10: 300 ug/kg/min via INTRAVENOUS

## 2018-01-10 NOTE — Anesthesia Procedure Notes (Signed)
Procedure Name: MAC Date/Time: 01/10/2018 8:19 AM Performed by: Imagene Riches, CRNA Pre-anesthesia Checklist: Patient identified, Emergency Drugs available, Suction available and Patient being monitored Patient Re-evaluated:Patient Re-evaluated prior to induction Oxygen Delivery Method: Nasal cannula Preoxygenation: Pre-oxygenation with 100% oxygen Induction Type: IV induction

## 2018-01-10 NOTE — Anesthesia Postprocedure Evaluation (Addendum)
Anesthesia Post Note  Patient: Angela [name]  Procedure(s) Performed: ESOPHAGOGASTRODUODENOSCOPY (EGD) (N/A ) BALLOON DILATION (N/A )     Patient location during evaluation: Endoscopy Anesthesia Type: MAC Level of consciousness: awake and alert Pain management: pain level controlled Vital Signs Assessment: post-procedure vital signs reviewed and stable Respiratory status: spontaneous breathing and respiratory function stable Cardiovascular status: stable Postop Assessment: no apparent nausea or vomiting Anesthetic complications: no    Last Vitals:  Vitals:   01/10/18 0838 01/10/18 0845  BP: 118/84 103/78  Pulse:  (!) 111  Resp: 17 18  Temp: 36.6 C   SpO2: 95% 94%    Last Pain:  Vitals:   01/10/18 0838  TempSrc: Oral                 Caedon Bond DANIEL

## 2018-01-10 NOTE — Brief Op Note (Signed)
01/10/2018  8:40 AM  PATIENT:  Angela [name]  82 y.o. female  PRE-OPERATIVE DIAGNOSIS:  Pyloric stenosis  POST-OPERATIVE DIAGNOSIS:  large amount of food in stomach, dilatation of pyloric sphincter with 8/10 balloon  PROCEDURE:  Procedure(s): ESOPHAGOGASTRODUODENOSCOPY (EGD) (N/A) BALLOON DILATION (N/A)  SURGEON:  Surgeon(s) and Role:    [name], MD - Primary  PHYSICIAN ASSISTANT:   ASSISTANTS: Vista Lawman, RN, Tinnie Gens, TEch ANESTHESIA:   MAC  EBL:  Minimal  BLOOD ADMINISTERED:none  DRAINS: none   LOCAL MEDICATIONS USED:  NONE  SPECIMEN:  No Specimen  DISPOSITION OF SPECIMEN:  N/A  COUNTS:  YES  TOURNIQUET:  * No tourniquets in log *  DICTATION: .Dragon Dictation  PLAN OF CARE: Discharge from PACU  PATIENT DISPOSITION:  PACU - hemodynamically stable.   Delay start of Pharmacological VTE agent (>24hrs) due to surgical blood loss or risk of bleeding: no

## 2018-01-10 NOTE — H&P (Signed)
History of Present Illness  General:  81/female was referred for gastroparesis and GERD. UGI series from 2017 for dysphagia showed mild generalized tapering of distal esophagus, elongated stomach, gastric outlet obstruction(as no emptying was noted of the contrast at 20 minutes) along with retained food in the stomach. EGD was performed by Dr.Schooler on 01/07/16 for nausea, weight loss and GOO which showed moderate amount of barium and food particles in fundus and proximal gastric body, J shapted stomach, narrowing of pylori channel, edematous pyloric channel with a polypoid lesion, biopsies showed peptic duodenitis. EGD in 2016 by Dr.Johnson showed stomach filled with liquid and solid food, was not able to locate the pylorus. EGD from 2011 showed healed pyloric chanel ulcer and severe gastroparesis. COlonoscopy from 2004 showed a benign non adenomatous polyp at splenic flexure. GES from 2010 showed abnormal gastric retention wtih 70% gastric activity at 120 minutes. CBC from 4/18 showed Hb 14.6, MCV 108.8, plt 153. Her prior biopsies and h pylori testing have been unremarkable. Patient states she has early satiety and upper abdominal pain, intermittently has problems swallowing tough solids such as meat and has lost about 15-20 pounds the last several months. She used to have black stools with iron but now the stools are brown and denies blood in stool, denies diarrhea or constipation. She is concerned that she is on multiple medications and wants to see how it can be tailored down. Currently she takes Protonix in the morning and ranitidine at night.   Current Medications  Taking   Voltaren(Diclofenac Sodium (Ophth)) 1 % Gel as directed Transdermal as needed   Flonase(Fluticasone Propionate) 50 MCG/ACT Suspension 2 sprays in each nostril Nasally Once a day, prn   Oxycodone-Acetaminophen 7.5-325 MG Tablet 1 tablet Orally four times a day as needed   Lidoderm(Lidocaine) 5 % Patch 1 patch to skin  remove after 12 hours Externally Once a day   Furosemide 40 MG Tablet 1 tablet, 1/2 tablet prn for extra swelling Orally once a day   Toviaz(Fesoterodine Fumarate ER) 8 MG Tablet Extended Release 24 Hour 1 tablet Orally Once a day   Folic Acid 1 MG Tablet 1 tablet Orally Once a day   Nystatin 100000 U Cream APPLY EXTERNALLY TO THE AFFECTED AREA TWICE A DAY AS NEEDED AS DIRECTED   Nitroglycerin 0.4 mg 0.4 mg tablet 1 tablet as directed SL as directed prn chest pain   EpiPen 2-Pak(EPINEPHrine) 0.3 MG/0.3ML Device as directed Injection   Cyanocobalamin 2000 MCG Tablet 1 tablet Orally Once a day   Benadryl(DiphenhydrAMINE HCl) prn   Pepto-Bismol(Bismuth Subsalicylate) 161 WR/60AV Suspension 30 ml as needed Orally prn   Probiotic - Tablet Delayed Release Orally   Simvastatin 40 MG Tablet 1/2 tablet Orally Once a day   Methotrexate 2.5MG  Tablet TAKE 6 TABLETS ONCE WEEKLY   PredniSONE 5 MG 5mg  Tablet 1 tab Orally Once a day   Calcium 1200+D3(Calcium-Magnesium-Vitamin D ER) 1 capsule once a day   Escitalopram Oxalate 5 MG Tablet 1 tablet Orally Once a day, in am   Stool Softener(Docusate Sodium) 100 MG Capsule 1 capsule as needed Orally Once a day   Senna 8.6 MG Tablet 1 tablets at bedtime as needed Orally Once a day, prn   Lorazepam 2 MG Tablet 1/2 tablet at bedtime as needed Orally qhs   Propranolol HCl 10 MG Tablet 1 tablet Orally once a day, prn   Zantac 150 Maximum Strength(RaNITidine HCl) 150 MG Tablet 1 tablet at bedtime Orally Once a day   Eliquis  5 mg Tablet 1 tablet orally twice a day   Acebutolol HCl 200 MG Capsule 1 capsule Orally Once a day   Butalbital-APAP-Caffeine 50-325-40 MG Tablet TAKE 1 TABLET EVERY 4 HOURS AS NEEDED FOR TENSION HEADACHE   Pantoprazole Sodium 40 MG Tablet Delayed Release 1 TABLET TWICE A DAY ORALLY   Not-Taking   Digoxin 125 MCG Tablet 1 tablet Orally Once a day   Medication List reviewed and reconciled with the patient    Past Medical History  coronary  disease status post PCI of with BMS to mid LAD--2/08, [place] not on ASA due to frequent ulers.   Asthma.   Depression.   Dyslipidemia.   GERD.   Hypertension.   Obesity.   rheumatoid arthritis-erosive, s/p cervical spine fusion, [place].   PVCs.   Shrimp allergy.   PUD - cannot take ASA, pyloric channel ulcer 2010.   severe gastroparesis, nuclear emptying study and EGD 2010.   Osteoporosis bisphosphonates since 2005-2016.   macular degeneration, [place].   Impaired fasting glucose.   Atrial flutter while hospitalized 2017, echo moderate aortic stenosis, ejection fraction 45%, moderate to severe left atrial enlargement.   Moderate aortic stenosis - echo 2017.   GI Wynetta Emery, Pain Annie Sable, NS Dannielle Burn, cardio [place], urology [facility]maid , allergry van winkle, rheum [place].    Surgical History  status post cholecystectomy   status post C1-2, [place] 2008  status post bilateral total knee replacements   status post bilateral ankle fusion   wedge excision right auricularlesion/Dr Elie Goody 07/08/08   Family History  Father: deceased 38 yrs, MVA  Mother: deceased 20 yrs, Old age  Brother 31: deceased, melanoma  Brother2: alive  2 brother(s) . 2daughter(s) .   Significant GI family history: None \\nNo  autoimmune disease\nOne daughter has passed Neg family hx of colon cancer/polyps or liver disease.   Social History  General:  Tobacco use  cigarettes: Never smoked Additional Findings: Tobacco Non-User Non-smoker for personal reasons Tobacco history last updated 08/08/2017 Alcohol: no.  Caffeine: yes.  no Recreational drug use.  Exercise: walking around house, .  Marital Status: Widowed January 2015.  OCCUPATION: retired, Geophysical data processor.  no Smoking.    Allergies  Codeine (for allergy): nausea  Seafood: swelling  Peanuts: tongue lips swell   Enablex: not effective   Myrbetriq: lip swollen  Pineapple Flavor: lip swollen?   Amoxicillin: swollen lips/tongue  Clindamycin 300 mg 10d: diarrhea, nausea    Hospitalization/Major Diagnostic Procedure  not in the past yr 12/2017   Review of Systems  CONSTITUTIONAL:  Chills No. Fatigue YES. Fever No. Insomnia No. Night sweats No. Weight change YES.  HEENT:  Change in vision No. Double vision No . Hoarseness No. Nose bleeds No. sore throat No. Sinus Problems No. Glaucoma No.  CARDIOLOGY:  ByPass Surgery No. Poor Circulation YES. Artificial Heart Valves No. High blood pressure No. History of Heart attack No. Irregular heart beat YES. Known coronary artery disease No. Pacemaker/Defibrillator No.  RESPIRATORY:  Shortness of breath No. Cough No. Excessive Sputum No. Using Oxygen No. Asthma No. Bronchitis No. Pneumonia No. Sleep Apnea No.  UROLOGY:  Interstitials Cystitis No. Incontinence YES. Blood in urine No. Difficulty urinating No. Kidney disease No. Kidney stones No.  GASTROENTEROLOGY:  Abdominal pain YES. Acid reflux YES. Black stools No. Bloating/belching YES. Change in bowel habits No. Constipation YES. Dark tarry stools YES. Diarrhea No. Difficulty swallowing YES. Heartburn YES. Incontinence of stool No. Indigestion: YES. Lactose intolerance No. Nausea No. Rectal  bleeding No. Vomiting No. Hepatitis/yellow jaundice No. History of Ulcers YES. Use of Pain Medication YES. Previous Colonoscopy No.  MUSCULOSKELETAL:  joint pain YES. Arthritis YES. Joint Replacement YES.  NEUROLOGY:  Dizziness YES. Fainting No. Headache No. Paralysis No. Seizures No. Strokes No. Weakness YES. Alzheimer's No.  SKIN:  Rash YES. Bruises easily YES.  ENDOCRINOLOGY:  Diabetes No. High cholesterol No. Thyroid disorder No.  HEMATOLOGY/LYMPH:  Abnormal bleeding No. Anemia No. Enlarged lymph nodes No. Past blood transfusion No. Swollen glands No. Blood Clots No. Using Blood Thinners YES.  INFECTIOUS DISEASE:  HIV/AIDS No. Tuberculosis No . Hepatitis No. Sexually Transmitted Disease No. MRSA  No.  GI PROCEDURE:  no Pacemaker/ AICD, no. no Artificial heart valves. no MI/heart attack. Abnormal heart rhythm YES, Atrial Fibrillation. no Angina. no CVA. no Hypertension. no Hypotension. no Asthma, COPD. no Sleep apnea. no Seizure disorders. Artificial joints YES. no Severe DJD. no Diabetes. no Significant headaches. Vertigo YES. Depression/anxiety YES, anxiety. no Abnormal bleeding. no Kidney Disease. no Liver disease, no. no Chance of pregnancy. no Blood transfusion. no Method of Birth Control. no Birth control pills.  GU/GYN:  Pregnant Now No. Trying to conceive No. Use Birth Control No. Heavy Periods No.       Vital Signs  Wt 111.4, Wt change 2.2 lb, Ht 61.5, BMI 20.71, Temp 96.6, Pulse sitting 100, BP sitting 91/66.   Examination  Gastroenterology:: GENERAL APPEARANCE: frail appearing, thinly built, no active distress, pleasant, no acute distress.  EYES: Lids and conjunctiva normal. Sclera normal, pupils equal and reactive .  ORAL CAVITY: Lips, teeth and gums are normal. Pharynx, tongue, mucosa normal .  SCLERA: anicteric .  NECK Full ROM, trachea midline, no thyromegaly or masses .  CARDIOVASCULAR PMI LS border. Normal RRR w/o murmers or gallops. No peripheral edema .  RESPIRATORY Breath sounds normal. Respiration even and unlabored .  ABDOMEN No masses palpated. Liver and spleen not palpated, normal. Bowel sounds normal, Abdomen mildly distended, tympanitic on percussion, not tender.  EXTREMITIES: No edema, pulses intact .  NEURO: decreased strength and reflexes, cranial nerves II-XII grossly intact, uses a walker and needs assistance .  PSYCH: mood/affect normal .     Assessments   1. Pyloric stenosis - K31.1 (Primary)   Treatment  1. Pyloric stenosis  IMAGING: Upper gastrointestinal (UGI) series Notes: She wants to avoid invasive procedures if possible. I discussed about an EGD with dilation of the pyloric channel. SHe will need to hold eliquis for at least 24 hours  before the procedure. At times point she wants to start with an imaging study to identify the area of stenosis and the severity of stenosis. Meanwhile I have advised patient to continue PPI indefinietly. Also, advised to take small frequent meals of low fiber and low residue diet.  Referral HA:[facility] Reason:Upper GI series-spoke with [id number] location  UGI series from 12/31/17 showed  IMPRESSION: 1. Considerably delayed passage of barium into the duodenum suggesting a degree of gastric outlet obstruction. Recommend endoscopy to assess further. 2. Multiple filling defects within the stomach most consistent with retained food debris, but in view of this pattern, polypoid gastric lesions cannot be excluded.   Plan diagnostic EGD with pyloric dilation.  [name], MD

## 2018-01-10 NOTE — Op Note (Signed)
EGD was performed for therapy of pyloric stenosis.  Findings: Normal-appearing esophagus but Z line appeared irregular at 38 cm. Large amount of food residue found in the gastric body. J-shaped stomach causing significant looping. Pyloric stenosis with almost pinpoint opening noted, surrounding superficial ulceration at pyloric opening. Balloon dilatation with 8 mm and 10 mm pyloric balloon performed. Unable to pass gastroscope beyond the pylorus even after dilatation due to significant looping.   Recommendation: Full liquid diet, low residue, low fiber diet. Repeat EGD in 4 weeks for repeat balloon dilatation.   [name], M.D.

## 2018-01-10 NOTE — Discharge Instructions (Signed)

## 2018-01-10 NOTE — Addendum Note (Signed)
Addendum  created 01/10/18 1005 by Duane Boston, MD   Sign clinical note

## 2018-01-10 NOTE — Transfer of Care (Signed)
Immediate Anesthesia Transfer of Care Note  Patient: Angela [name]  Procedure(s) Performed: ESOPHAGOGASTRODUODENOSCOPY (EGD) (N/A ) BALLOON DILATION (N/A )  Patient Location: Endoscopy Unit  Anesthesia Type:MAC  Level of Consciousness: awake, alert  and oriented  Airway & Oxygen Therapy: Patient Spontanous Breathing and Patient connected to nasal cannula oxygen  Post-op Assessment: Report given to RN and Post -op Vital signs reviewed and stable  Post vital signs: Reviewed and stable  Last Vitals:  Vitals:   01/10/18 0730 01/10/18 0838  BP: 122/65 118/84  Pulse: 88   Resp: 14 17  Temp: 36.7 C 36.6 C  SpO2: 99% 95%    Last Pain:  Vitals:   01/10/18 0838  TempSrc: Oral         Complications: No apparent anesthesia complications

## 2018-01-10 NOTE — H&P (View-Only) (Signed)
History of Present Illness  General:  81/female was referred for gastroparesis and GERD. UGI series from 2017 for dysphagia showed mild generalized tapering of distal esophagus, elongated stomach, gastric outlet obstruction(as no emptying was noted of the contrast at 20 minutes) along with retained food in the stomach. EGD was performed by Dr.Schooler on 01/07/16 for nausea, weight loss and GOO which showed moderate amount of barium and food particles in fundus and proximal gastric body, J shapted stomach, narrowing of pylori channel, edematous pyloric channel with a polypoid lesion, biopsies showed peptic duodenitis. EGD in 2016 by Dr.Johnson showed stomach filled with liquid and solid food, was not able to locate the pylorus. EGD from 2011 showed healed pyloric chanel ulcer and severe gastroparesis. COlonoscopy from 2004 showed a benign non adenomatous polyp at splenic flexure. GES from 2010 showed abnormal gastric retention wtih 70% gastric activity at 120 minutes. CBC from 4/18 showed Hb 14.6, MCV 108.8, plt 153. Her prior biopsies and h pylori testing have been unremarkable. Patient states she has early satiety and upper abdominal pain, intermittently has problems swallowing tough solids such as meat and has lost about 15-20 pounds the last several months. She used to have black stools with iron but now the stools are brown and denies blood in stool, denies diarrhea or constipation. She is concerned that she is on multiple medications and wants to see how it can be tailored down. Currently she takes Protonix in the morning and ranitidine at night.   Current Medications  Taking   Voltaren(Diclofenac Sodium (Ophth)) 1 % Gel as directed Transdermal as needed   Flonase(Fluticasone Propionate) 50 MCG/ACT Suspension 2 sprays in each nostril Nasally Once a day, prn   Oxycodone-Acetaminophen 7.5-325 MG Tablet 1 tablet Orally four times a day as needed   Lidoderm(Lidocaine) 5 % Patch 1 patch to skin  remove after 12 hours Externally Once a day   Furosemide 40 MG Tablet 1 tablet, 1/2 tablet prn for extra swelling Orally once a day   Toviaz(Fesoterodine Fumarate ER) 8 MG Tablet Extended Release 24 Hour 1 tablet Orally Once a day   Folic Acid 1 MG Tablet 1 tablet Orally Once a day   Nystatin 100000 U Cream APPLY EXTERNALLY TO THE AFFECTED AREA TWICE A DAY AS NEEDED AS DIRECTED   Nitroglycerin 0.4 mg 0.4 mg tablet 1 tablet as directed SL as directed prn chest pain   EpiPen 2-Pak(EPINEPHrine) 0.3 MG/0.3ML Device as directed Injection   Cyanocobalamin 2000 MCG Tablet 1 tablet Orally Once a day   Benadryl(DiphenhydrAMINE HCl) prn   Pepto-Bismol(Bismuth Subsalicylate) 761 PJ/09TO Suspension 30 ml as needed Orally prn   Probiotic - Tablet Delayed Release Orally   Simvastatin 40 MG Tablet 1/2 tablet Orally Once a day   Methotrexate 2.5MG  Tablet TAKE 6 TABLETS ONCE WEEKLY   PredniSONE 5 MG 5mg  Tablet 1 tab Orally Once a day   Calcium 1200+D3(Calcium-Magnesium-Vitamin D ER) 1 capsule once a day   Escitalopram Oxalate 5 MG Tablet 1 tablet Orally Once a day, in am   Stool Softener(Docusate Sodium) 100 MG Capsule 1 capsule as needed Orally Once a day   Senna 8.6 MG Tablet 1 tablets at bedtime as needed Orally Once a day, prn   Lorazepam 2 MG Tablet 1/2 tablet at bedtime as needed Orally qhs   Propranolol HCl 10 MG Tablet 1 tablet Orally once a day, prn   Zantac 150 Maximum Strength(RaNITidine HCl) 150 MG Tablet 1 tablet at bedtime Orally Once a day   Eliquis  5 mg Tablet 1 tablet orally twice a day   Acebutolol HCl 200 MG Capsule 1 capsule Orally Once a day   Butalbital-APAP-Caffeine 50-325-40 MG Tablet TAKE 1 TABLET EVERY 4 HOURS AS NEEDED FOR TENSION HEADACHE   Pantoprazole Sodium 40 MG Tablet Delayed Release 1 TABLET TWICE A DAY ORALLY   Not-Taking   Digoxin 125 MCG Tablet 1 tablet Orally Once a day   Medication List reviewed and reconciled with the patient    Past Medical History  coronary  disease status post PCI of with BMS to mid LAD--2/08, Turner not on ASA due to frequent ulers.   Asthma.   Depression.   Dyslipidemia.   GERD.   Hypertension.   Obesity.   rheumatoid arthritis-erosive, s/p cervical spine fusion, [place].   PVCs.   Shrimp allergy.   PUD - cannot take ASA, pyloric channel ulcer 2010.   severe gastroparesis, nuclear emptying study and EGD 2010.   Osteoporosis bisphosphonates since 2005-2016.   macular degeneration, [place].   Impaired fasting glucose.   Atrial flutter while hospitalized 2017, echo moderate aortic stenosis, ejection fraction 45%, moderate to severe left atrial enlargement.   Moderate aortic stenosis - echo 2017.   GI Wynetta Emery, Pain Annie Sable, NS Dannielle Burn, cardio Nahser, urology macdermaid , allergry van winkle, rheum Woodall.    Surgical History  status post cholecystectomy   status post C1-2, [place] 2008  status post bilateral total knee replacements   status post bilateral ankle fusion   wedge excision right auricularlesion/Dr Elie Goody 07/08/08   Family History  Father: deceased 34 yrs, MVA  Mother: deceased 81 yrs, Old age  Brother 32: deceased, melanoma  Brother2: alive  2 brother(s) . 2daughter(s) .   Significant GI family history: None \\nNo  autoimmune disease\nOne daughter has passed Neg family hx of colon cancer/polyps or liver disease.   Social History  General:  Tobacco use  cigarettes: Never smoked Additional Findings: Tobacco Non-User Non-smoker for personal reasons Tobacco history last updated 08/08/2017 Alcohol: no.  Caffeine: yes.  no Recreational drug use.  Exercise: walking around house, .  Marital Status: Widowed January 2015.  OCCUPATION: retired, Geophysical data processor.  no Smoking.    Allergies  Codeine (for allergy): nausea  Seafood: swelling  Peanuts: tongue lips swell   Enablex: not effective   Myrbetriq: lip swollen  Pineapple Flavor: lip swollen?   Amoxicillin: swollen lips/tongue  Clindamycin 300 mg 10d: diarrhea, nausea    Hospitalization/Major Diagnostic Procedure  not in the past yr 12/2017   Review of Systems  CONSTITUTIONAL:  Chills No. Fatigue YES. Fever No. Insomnia No. Night sweats No. Weight change YES.  HEENT:  Change in vision No. Double vision No . Hoarseness No. Nose bleeds No. sore throat No. Sinus Problems No. Glaucoma No.  CARDIOLOGY:  ByPass Surgery No. Poor Circulation YES. Artificial Heart Valves No. High blood pressure No. History of Heart attack No. Irregular heart beat YES. Known coronary artery disease No. Pacemaker/Defibrillator No.  RESPIRATORY:  Shortness of breath No. Cough No. Excessive Sputum No. Using Oxygen No. Asthma No. Bronchitis No. Pneumonia No. Sleep Apnea No.  UROLOGY:  Interstitials Cystitis No. Incontinence YES. Blood in urine No. Difficulty urinating No. Kidney disease No. Kidney stones No.  GASTROENTEROLOGY:  Abdominal pain YES. Acid reflux YES. Black stools No. Bloating/belching YES. Change in bowel habits No. Constipation YES. Dark tarry stools YES. Diarrhea No. Difficulty swallowing YES. Heartburn YES. Incontinence of stool No. Indigestion: YES. Lactose intolerance No. Nausea No. Rectal  bleeding No. Vomiting No. Hepatitis/yellow jaundice No. History of Ulcers YES. Use of Pain Medication YES. Previous Colonoscopy No.  MUSCULOSKELETAL:  joint pain YES. Arthritis YES. Joint Replacement YES.  NEUROLOGY:  Dizziness YES. Fainting No. Headache No. Paralysis No. Seizures No. Strokes No. Weakness YES. Alzheimer's No.  SKIN:  Rash YES. Bruises easily YES.  ENDOCRINOLOGY:  Diabetes No. High cholesterol No. Thyroid disorder No.  HEMATOLOGY/LYMPH:  Abnormal bleeding No. Anemia No. Enlarged lymph nodes No. Past blood transfusion No. Swollen glands No. Blood Clots No. Using Blood Thinners YES.  INFECTIOUS DISEASE:  HIV/AIDS No. Tuberculosis No . Hepatitis No. Sexually Transmitted Disease No. MRSA  No.  GI PROCEDURE:  no Pacemaker/ AICD, no. no Artificial heart valves. no MI/heart attack. Abnormal heart rhythm YES, Atrial Fibrillation. no Angina. no CVA. no Hypertension. no Hypotension. no Asthma, COPD. no Sleep apnea. no Seizure disorders. Artificial joints YES. no Severe DJD. no Diabetes. no Significant headaches. Vertigo YES. Depression/anxiety YES, anxiety. no Abnormal bleeding. no Kidney Disease. no Liver disease, no. no Chance of pregnancy. no Blood transfusion. no Method of Birth Control. no Birth control pills.  GU/GYN:  Pregnant Now No. Trying to conceive No. Use Birth Control No. Heavy Periods No.       Vital Signs  Wt 111.4, Wt change 2.2 lb, Ht 61.5, BMI 20.71, Temp 96.6, Pulse sitting 100, BP sitting 91/66.   Examination  Gastroenterology:: GENERAL APPEARANCE: frail appearing, thinly built, no active distress, pleasant, no acute distress.  EYES: Lids and conjunctiva normal. Sclera normal, pupils equal and reactive .  ORAL CAVITY: Lips, teeth and gums are normal. Pharynx, tongue, mucosa normal .  SCLERA: anicteric .  NECK Full ROM, trachea midline, no thyromegaly or masses .  CARDIOVASCULAR PMI LS border. Normal RRR w/o murmers or gallops. No peripheral edema .  RESPIRATORY Breath sounds normal. Respiration even and unlabored .  ABDOMEN No masses palpated. Liver and spleen not palpated, normal. Bowel sounds normal, Abdomen mildly distended, tympanitic on percussion, not tender.  EXTREMITIES: No edema, pulses intact .  NEURO: decreased strength and reflexes, cranial nerves II-XII grossly intact, uses a walker and needs assistance .  PSYCH: mood/affect normal .     Assessments   1. Pyloric stenosis - K31.1 (Primary)   Treatment  1. Pyloric stenosis  IMAGING: Upper gastrointestinal (UGI) series Notes: She wants to avoid invasive procedures if possible. I discussed about an EGD with dilation of the pyloric channel. SHe will need to hold eliquis for at least 24 hours  before the procedure. At times point she wants to start with an imaging study to identify the area of stenosis and the severity of stenosis. Meanwhile I have advised patient to continue PPI indefinietly. Also, advised to take small frequent meals of low fiber and low residue diet.  Referral GG:[facility] Reason:Upper GI series-spoke with [id number] location  UGI series from 12/31/17 showed  IMPRESSION: 1. Considerably delayed passage of barium into the duodenum suggesting a degree of gastric outlet obstruction. Recommend endoscopy to assess further. 2. Multiple filling defects within the stomach most consistent with retained food debris, but in view of this pattern, polypoid gastric lesions cannot be excluded.   Plan diagnostic EGD with pyloric dilation.  [name], MD

## 2018-01-10 NOTE — Op Note (Signed)
[facility] Patient Name: Angela [name] Procedure Date : 01/10/2018 MRN: [id number] Attending MD: [name] , MD Date of Birth: 06-10-1936 CSN: [id number] Age: 82 Admit Type: Outpatient Procedure:                Upper GI endoscopy Indications:              For therapy of pyloric stenosis Providers:                [name], MD, Vista Lawman, RN, Tinnie Gens,                            Technician Referring MD:              Medicines:                Monitored Anesthesia Care Complications:            No immediate complications. Estimated Blood Loss:     Estimated blood loss: none. Procedure:                Pre-Anesthesia Assessment:                           - Prior to the procedure, a History and Physical                            was performed, and patient medications and                            allergies were reviewed. The patient's tolerance of                            previous anesthesia was also reviewed. The risks                            and benefits of the procedure and the sedation                            options and risks were discussed with the patient.                            All questions were answered, and informed consent                            was obtained. Prior Anticoagulants: The patient has                            taken Eliquis (apixaban), last dose was 1 day prior                            to procedure. ASA Grade Assessment: III - A patient                            with severe systemic disease. After reviewing the  risks and benefits, the patient was deemed in                            satisfactory condition to undergo the procedure.                           After obtaining informed consent, the endoscope was                            passed under direct vision. Throughout the                            procedure, the patient's blood pressure, pulse, and                            oxygen  saturations were monitored continuously. The                            Endoscope was introduced through the mouth, and                            advanced to the pylorus. The patient tolerated the                            procedure well. The upper GI endoscopy was                            technically difficult and complex due to abnormal                            anatomy, a J-shaped stomach which made pyloric                            intubation difficult and stenosis. Scope In: Scope Out: Findings:      The examined esophagus was normal.      The Z-line was irregular and was found 38 cm from the incisors.      A large amount of food (residue) was found in the gastric body.      The cardia and gastric fundus were normal on retroflexion.      A benign-appearing, intrinsic severe stenosis was found at the pylorus.       This was non-traversed. A TTS dilator was passed through the scope.       Dilation with an 06-13-09 mm pyloric balloon dilator was performed. The       dilation site was examined following endoscope reinsertion and showed       mild improvement in luminal narrowing. Despite dilation at 10 mm, the       scope could not be advanced into the duodenum, also due to significant       looping with a J shaped stomach.      Few non-bleeding superficial gastric ulcers with a clean ulcer base       (Forrest Class III) were found at the pylorus. Impression:               - Normal esophagus.                           -  Z-line irregular, 38 cm from the incisors.                           - A large amount of food (residue) in the stomach.                           - Gastric stenosis was found at the pylorus.                            Dilated.                           - Non-bleeding gastric ulcers with a clean ulcer                            base (Forrest Class III).                           - No specimens collected. Moderate Sedation:      Patient did not receive moderate  sedation for this procedure, but       instead received monitored anesthesia care. Recommendation:           - Patient has a contact number available for                            emergencies. The signs and symptoms of potential                            delayed complications were discussed with the                            patient. Return to normal activities tomorrow.                            Written discharge instructions were provided to the                            patient.                           - Full liquid diet.                           - Use Protonix (pantoprazole) 40 mg PO daily for 2                            weeks.                           - Repeat upper endoscopy in 4 weeks to evaluate the                            response to therapy.                           - Continue present medications.  Procedure Code(s):        --- Professional ---                           [id number], 63, Esophagogastroduodenoscopy, flexible,                            transoral; with dilation of gastric/duodenal                            stricture(s) (eg, balloon, bougie) Diagnosis Code(s):        --- Professional ---                           K22.8, Other specified diseases of esophagus                           K31.1, Adult hypertrophic pyloric stenosis                           K25.9, Gastric ulcer, unspecified as acute or                            chronic, without hemorrhage or perforation CPT copyright 2016 American Medical Association. All rights reserved. The codes documented in this report are preliminary and upon coder review may  be revised to meet current compliance requirements. [name], MD 01/10/2018 8:39:52 AM This report has been signed electronically. Number of Addenda: 0

## 2018-01-11 ENCOUNTER — Encounter (HOSPITAL_COMMUNITY): Payer: Self-pay | Admitting: Gastroenterology

## 2018-01-16 ENCOUNTER — Other Ambulatory Visit: Payer: Self-pay | Admitting: Gastroenterology

## 2018-01-16 DIAGNOSIS — M7989 Other specified soft tissue disorders: Secondary | ICD-10-CM | POA: Diagnosis not present

## 2018-01-16 DIAGNOSIS — K311 Adult hypertrophic pyloric stenosis: Secondary | ICD-10-CM | POA: Diagnosis not present

## 2018-01-17 ENCOUNTER — Other Ambulatory Visit: Payer: Self-pay | Admitting: Gastroenterology

## 2018-01-17 DIAGNOSIS — M7989 Other specified soft tissue disorders: Secondary | ICD-10-CM

## 2018-01-21 ENCOUNTER — Other Ambulatory Visit: Payer: Self-pay | Admitting: Registered Nurse

## 2018-01-21 DIAGNOSIS — M05711 Rheumatoid arthritis with rheumatoid factor of right shoulder without organ or systems involvement: Secondary | ICD-10-CM

## 2018-01-21 DIAGNOSIS — M05712 Rheumatoid arthritis with rheumatoid factor of left shoulder without organ or systems involvement: Principal | ICD-10-CM

## 2018-01-21 DIAGNOSIS — M4316 Spondylolisthesis, lumbar region: Secondary | ICD-10-CM

## 2018-01-22 ENCOUNTER — Ambulatory Visit
Admission: RE | Admit: 2018-01-22 | Discharge: 2018-01-22 | Disposition: A | Discharge status: Home / Self Care | Payer: Medicare Other | Source: Ambulatory Visit | Attending: Gastroenterology | Admitting: Gastroenterology

## 2018-01-22 DIAGNOSIS — M21271 Flexion deformity, right ankle and toes: Secondary | ICD-10-CM | POA: Diagnosis not present

## 2018-01-22 DIAGNOSIS — M7989 Other specified soft tissue disorders: Secondary | ICD-10-CM

## 2018-01-23 ENCOUNTER — Encounter (HOSPITAL_COMMUNITY): Payer: Self-pay | Admitting: *Deleted

## 2018-01-23 ENCOUNTER — Other Ambulatory Visit: Payer: Self-pay

## 2018-01-23 NOTE — Progress Notes (Signed)
Pt denies any acute cardiopulmonary issues. Pt under the care of Dr. Acie Fredrickson, Cardiology.  Pt made aware to stop taking vitamins, fish oil, Ginger, Turmeric and herbal medications. Do not take any NSAIDs ie: Ibuprofen, Advil, Naproxen (Aleve), Motrin, BC and Goody Powder. Pt verbalized understanding of all pre-op instructions.

## 2018-01-24 ENCOUNTER — Ambulatory Visit (HOSPITAL_COMMUNITY): Payer: Medicare Other | Admitting: Certified Registered"

## 2018-01-24 ENCOUNTER — Other Ambulatory Visit: Payer: Self-pay

## 2018-01-24 ENCOUNTER — Encounter (HOSPITAL_COMMUNITY): Payer: Self-pay | Admitting: *Deleted

## 2018-01-24 ENCOUNTER — Ambulatory Visit (HOSPITAL_COMMUNITY)
Admission: RE | Admit: 2018-01-24 | Discharge: 2018-01-24 | Disposition: A | Discharge status: Home / Self Care | Payer: Medicare Other | Source: Ambulatory Visit | Attending: Gastroenterology | Admitting: Gastroenterology

## 2018-01-24 ENCOUNTER — Encounter (HOSPITAL_COMMUNITY)
Admission: RE | Disposition: A | Discharge status: Home / Self Care | Payer: Self-pay | Source: Ambulatory Visit | Attending: Gastroenterology

## 2018-01-24 DIAGNOSIS — I2510 Atherosclerotic heart disease of native coronary artery without angina pectoris: Secondary | ICD-10-CM | POA: Diagnosis not present

## 2018-01-24 DIAGNOSIS — K259 Gastric ulcer, unspecified as acute or chronic, without hemorrhage or perforation: Secondary | ICD-10-CM | POA: Diagnosis not present

## 2018-01-24 DIAGNOSIS — Z7901 Long term (current) use of anticoagulants: Secondary | ICD-10-CM | POA: Insufficient documentation

## 2018-01-24 DIAGNOSIS — I119 Hypertensive heart disease without heart failure: Secondary | ICD-10-CM | POA: Diagnosis not present

## 2018-01-24 DIAGNOSIS — K311 Adult hypertrophic pyloric stenosis: Secondary | ICD-10-CM | POA: Diagnosis not present

## 2018-01-24 DIAGNOSIS — F329 Major depressive disorder, single episode, unspecified: Secondary | ICD-10-CM | POA: Diagnosis not present

## 2018-01-24 DIAGNOSIS — Z79899 Other long term (current) drug therapy: Secondary | ICD-10-CM | POA: Insufficient documentation

## 2018-01-24 DIAGNOSIS — K219 Gastro-esophageal reflux disease without esophagitis: Secondary | ICD-10-CM | POA: Diagnosis not present

## 2018-01-24 DIAGNOSIS — Z955 Presence of coronary angioplasty implant and graft: Secondary | ICD-10-CM | POA: Insufficient documentation

## 2018-01-24 DIAGNOSIS — E785 Hyperlipidemia, unspecified: Secondary | ICD-10-CM | POA: Insufficient documentation

## 2018-01-24 DIAGNOSIS — I10 Essential (primary) hypertension: Secondary | ICD-10-CM | POA: Diagnosis not present

## 2018-01-24 DIAGNOSIS — J45909 Unspecified asthma, uncomplicated: Secondary | ICD-10-CM | POA: Insufficient documentation

## 2018-01-24 DIAGNOSIS — Z96653 Presence of artificial knee joint, bilateral: Secondary | ICD-10-CM | POA: Diagnosis not present

## 2018-01-24 DIAGNOSIS — M069 Rheumatoid arthritis, unspecified: Secondary | ICD-10-CM | POA: Diagnosis not present

## 2018-01-24 DIAGNOSIS — K228 Other specified diseases of esophagus: Secondary | ICD-10-CM | POA: Diagnosis not present

## 2018-01-24 HISTORY — PX: ESOPHAGOGASTRODUODENOSCOPY: SHX5428

## 2018-01-24 HISTORY — PX: BALLOON DILATION: SHX5330

## 2018-01-24 HISTORY — DX: Polyp of colon: K63.5

## 2018-01-24 SURGERY — EGD (ESOPHAGOGASTRODUODENOSCOPY)
Anesthesia: Monitor Anesthesia Care

## 2018-01-24 MED ORDER — BUTAMBEN-TETRACAINE-BENZOCAINE 2-2-14 % EX AERO
INHALATION_SPRAY | CUTANEOUS | Status: DC | PRN
  Administered 2018-01-24: 1 via TOPICAL

## 2018-01-24 MED ORDER — SODIUM CHLORIDE 0.9 % IV SOLN: INTRAVENOUS | Status: DC

## 2018-01-24 MED ORDER — PROPOFOL 10 MG/ML IV BOLUS
INTRAVENOUS | Status: DC | PRN
  Administered 2018-01-24: 30 mg via INTRAVENOUS

## 2018-01-24 MED ORDER — ONDANSETRON HCL 4 MG/2ML IJ SOLN
INTRAMUSCULAR | Status: DC | PRN
  Administered 2018-01-24: 4 mg via INTRAVENOUS

## 2018-01-24 MED ORDER — PROPOFOL 500 MG/50ML IV EMUL
INTRAVENOUS | Status: DC | PRN
  Administered 2018-01-24: 75 ug/kg/min via INTRAVENOUS

## 2018-01-24 MED ORDER — LIDOCAINE 2% (20 MG/ML) 5 ML SYRINGE
INTRAMUSCULAR | Status: DC | PRN
  Administered 2018-01-24: 60 mg via INTRAVENOUS

## 2018-01-24 MED ORDER — LACTATED RINGERS IV SOLN
INTRAVENOUS | Status: DC
  Administered 2018-01-24: 07:00:00 via INTRAVENOUS

## 2018-01-24 NOTE — Brief Op Note (Signed)
01/24/2018  8:32 AM  PATIENT:  Angela [name]  82 y.o. female  PRE-OPERATIVE DIAGNOSIS:  Pyloric stenosis  POST-OPERATIVE DIAGNOSIS:  pyloric stenosis; balloon dilation to 54m for 1 minute.  heme present  PROCEDURE:  Procedure(s) with comments: ESOPHAGOGASTRODUODENOSCOPY (EGD) (N/A) BALLOON DILATION (N/A) - W/ Fluoroscopy. Please use colonoscope.  SURGEON:  Surgeon(s) and Role:    * KRonnette Juniper MD - Primary  PHYSICIAN ASSISTANT:   ASSISTANTS: JZenon Mayo RN, PCharolette Child Tech  ANESTHESIA:   MAC  EBL:  None  BLOOD ADMINISTERED:none  DRAINS: none   LOCAL MEDICATIONS USED:  NONE  SPECIMEN:  No Specimen  DISPOSITION OF SPECIMEN:  N/A  COUNTS:  YES  TOURNIQUET:  * No tourniquets in log *  DICTATION: .Dragon Dictation  PLAN OF CARE: Discharge to home after PACU  PATIENT DISPOSITION:  PACU - hemodynamically stable.   Delay start of Pharmacological VTE agent (>24hrs) due to surgical blood loss or risk of bleeding: no

## 2018-01-24 NOTE — Interval H&P Note (Signed)
History and Physical Interval Note: 81/female with gastric outlet obstruction from pyloric stenosis for repeat EGD with dilation.  01/24/2018 7:55 AM  Angela [name]  has presented today for EGD with dilation with the diagnosis of Pyloric stenosis  The various methods of treatment have been discussed with the patient and family. After consideration of risks, benefits and other options for treatment, the patient has consented to  Procedure(s) with comments: ESOPHAGOGASTRODUODENOSCOPY (EGD) (N/A) BALLOON DILATION (N/A) - W/ Fluoroscopy. Please use colonoscope. as a surgical intervention .  The patient's history has been reviewed, patient examined, no change in status, stable for surgery.  I have reviewed the patient's chart and labs.  Questions were answered to the patient's satisfaction.     [name]

## 2018-01-24 NOTE — Discharge Instructions (Signed)

## 2018-01-24 NOTE — Anesthesia Postprocedure Evaluation (Signed)
Anesthesia Post Note  Patient: Angela [name]  Procedure(s) Performed: ESOPHAGOGASTRODUODENOSCOPY (EGD) (N/A ) BALLOON DILATION (N/A )     Patient location during evaluation: Endoscopy Anesthesia Type: MAC Level of consciousness: awake Pain management: pain level controlled Vital Signs Assessment: post-procedure vital signs reviewed and stable Respiratory status: spontaneous breathing Cardiovascular status: stable Postop Assessment: no apparent nausea or vomiting Anesthetic complications: no    Last Vitals:  Vitals:   01/24/18 0845 01/24/18 0850  BP: (!) 100/53 106/71  Pulse: 90 90  Resp: 20 20  Temp:    SpO2: 97% 98%    Last Pain:  Vitals:   01/24/18 0850  TempSrc:   PainSc: 0-No pain   Pain Goal:                 Asar Evilsizer JR,JOHN Yanilen Adamik

## 2018-01-24 NOTE — Transfer of Care (Signed)
Immediate Anesthesia Transfer of Care Note  Patient: Angela [name]  Procedure(s) Performed: ESOPHAGOGASTRODUODENOSCOPY (EGD) (N/A ) BALLOON DILATION (N/A )  Patient Location: Endoscopy Unit  Anesthesia Type:MAC  Level of Consciousness: awake, alert  and oriented  Airway & Oxygen Therapy: Patient Spontanous Breathing  Post-op Assessment: Report given to RN  Post vital signs: Reviewed and stable  Last Vitals:  Vitals Value Taken Time  BP    Temp    Pulse 83 01/24/2018  8:33 AM  Resp 21 01/24/2018  8:33 AM  SpO2 97 % 01/24/2018  8:33 AM  Vitals shown include unvalidated device data.  Last Pain:  Vitals:   01/24/18 0719  TempSrc: Oral         Complications: No apparent anesthesia complications

## 2018-01-24 NOTE — Anesthesia Preprocedure Evaluation (Addendum)
Anesthesia Evaluation  Patient identified by MRN, date of birth, ID band Patient awake    Reviewed: Allergy & Precautions, NPO status , Patient's Chart, lab work & pertinent test results  Airway Mallampati: II       Dental  (+) Teeth Intact, Dental Advisory Given   Pulmonary    Pulmonary exam normal breath sounds clear to auscultation       Cardiovascular hypertension, Normal cardiovascular exam Rhythm:Regular Rate:Normal     Neuro/Psych    GI/Hepatic GERD  Medicated,  Endo/Other    Renal/GU      Musculoskeletal   Abdominal Normal abdominal exam  (+)   Peds  Hematology   Anesthesia Other Findings   Reproductive/Obstetrics                                                             Anesthesia Evaluation  Patient identified by MRN, date of birth, ID band Patient awake    Reviewed: Allergy & Precautions, NPO status , Patient's Chart, lab work & pertinent test results  History of Anesthesia Complications Negative for: history of anesthetic complications  Airway Mallampati: II  TM Distance: >3 FB Neck ROM: Full    Dental no notable dental hx. (+) Dental Advisory Given, Caps   Pulmonary asthma ,    Pulmonary exam normal        Cardiovascular hypertension, Pt. on medications + CAD and + Cardiac Stents  Normal cardiovascular exam  Conclusion    Prox LAD to Mid LAD lesion, 10% stenosed. The lesion was previously treated with a bare metal stent greater than two years ago.  The left ventricular systolic function is normal.   1. No obstructive CAD. Stent in the mid LAD is widely patent. 2. Good overall LV function. There is focal hypokinesis of the basal inferior wall that has been present since cardiac cath in 2005.      Neuro/Psych PSYCHIATRIC DISORDERS Depression negative neurological ROS     GI/Hepatic Neg liver ROS, PUD, GERD  ,  Endo/Other  negative  endocrine ROS  Renal/GU Renal disease     Musculoskeletal  (+) Arthritis ,   Abdominal   Peds  Hematology negative hematology ROS (+)   Anesthesia Other Findings   Reproductive/Obstetrics negative OB ROS                            Anesthesia Physical  Anesthesia Plan  ASA: III  Anesthesia Plan: MAC   Post-op Pain Management:    Induction: Intravenous  PONV Risk Score and Plan: 2 and Ondansetron and Propofol infusion  Airway Management Planned: Natural Airway, Nasal Cannula and Simple Face Mask  Additional Equipment:   Intra-op Plan:   Post-operative Plan:   Informed Consent: I have reviewed the patients History and Physical, chart, labs and discussed the procedure including the risks, benefits and alternatives for the proposed anesthesia with the patient or authorized representative who has indicated his/her understanding and acceptance.   Dental advisory given  Plan Discussed with: Anesthesiologist and CRNA  Anesthesia Plan Comments:        Anesthesia Quick Evaluation  Anesthesia Physical Anesthesia Plan  ASA: II  Anesthesia Plan: MAC   Post-op Pain Management:    Induction:   PONV Risk Score  and Plan: 2 and Ondansetron and Dexamethasone  Airway Management Planned: Natural Airway and Mask  Additional Equipment:   Intra-op Plan:   Post-operative Plan:   Informed Consent: I have reviewed the patients History and Physical, chart, labs and discussed the procedure including the risks, benefits and alternatives for the proposed anesthesia with the patient or authorized representative who has indicated his/her understanding and acceptance.   Dental advisory given  Plan Discussed with: CRNA and Surgeon  Anesthesia Plan Comments:         Anesthesia Quick Evaluation                                  Anesthesia Evaluation  Patient identified by MRN, date of birth, ID band Patient awake    Reviewed: Allergy &  Precautions, NPO status , Patient's Chart, lab work & pertinent test results  Airway Mallampati: II  TM Distance: >3 FB Neck ROM: Full    Dental no notable dental hx.    Pulmonary asthma ,    Pulmonary exam normal breath sounds clear to auscultation       Cardiovascular hypertension, Pt. on medications + CAD and + Cardiac Stents  Normal cardiovascular exam Rhythm:Regular Rate:Normal  BMS 2008. Cath in 2011 with no significant stenosis. Some CP at times. Very little ability to exert herself due to arthritis.   Neuro/Psych PSYCHIATRIC DISORDERS Depression negative neurological ROS     GI/Hepatic Neg liver ROS, PUD, GERD  ,  Endo/Other  negative endocrine ROS  Renal/GU Renal disease     Musculoskeletal  (+) Arthritis ,   Abdominal   Peds  Hematology negative hematology ROS (+)   Anesthesia Other Findings   Reproductive/Obstetrics negative OB ROS                            Anesthesia Physical Anesthesia Plan  ASA: III  Anesthesia Plan: MAC   Post-op Pain Management:    Induction: Intravenous  Airway Management Planned:   Additional Equipment:   Intra-op Plan:   Post-operative Plan:   Informed Consent: I have reviewed the patients History and Physical, chart, labs and discussed the procedure including the risks, benefits and alternatives for the proposed anesthesia with the patient or authorized representative who has indicated his/her understanding and acceptance.   Dental advisory given  Plan Discussed with: CRNA  Anesthesia Plan Comments:         Anesthesia Quick Evaluation

## 2018-01-24 NOTE — Anesthesia Procedure Notes (Signed)
Procedure Name: MAC Date/Time: 01/24/2018 8:05 AM Performed by: Barrington Ellison, CRNA Pre-anesthesia Checklist: Patient identified, Emergency Drugs available, Suction available, Patient being monitored and Timeout performed Patient Re-evaluated:Patient Re-evaluated prior to induction Oxygen Delivery Method: Nasal cannula

## 2018-01-24 NOTE — Op Note (Signed)
EGD was performed for therapy of pyloric stenosis.  Findings: Z line appeared irregular at 38 cm, rest of the esophagus was unremarkable. Moderate amount of semi-liquid and semisolid food noted in the gastric cavity. Extremely J-shaped stomach and pyloric stenosis caused significant looping despite using a pediatric colonoscope. Significant narrowing of the pyloric channel noted. 15 mm balloon dilatation performed for 1 minute. Scope was advanced beyond the pyloric channel into 2 duodenal bulb. Rest of the duodenum appeared unremarkable.  Recommendations: Low fiber, low residue diet. Continue PPI. Okay to resume Eliquis. Repeat endoscopy in 4 weeks for re-treatment.  [name], M.D.

## 2018-01-24 NOTE — Op Note (Signed)
[facility] Patient Name: Angela [name] Procedure Date : 01/24/2018 MRN: [id number] Attending MD: [name] , MD Date of Birth: 08/01/1936 CSN: [id number] Age: 82 Admit Type: Outpatient Procedure:                Upper GI endoscopy Indications:              For therapy of pyloric stenosis Providers:                [name], MD, Zenon Mayo, RN, Charolette Child,                            Technician, Sampson Si, CRNA Referring MD:              Medicines:                Monitored Anesthesia Care Complications:            No immediate complications. Estimated Blood Loss:     Estimated blood loss: none. Procedure:                Pre-Anesthesia Assessment:                           - Prior to the procedure, a History and Physical                            was performed, and patient medications and                            allergies were reviewed. The patient's tolerance of                            previous anesthesia was also reviewed. The risks                            and benefits of the procedure and the sedation                            options and risks were discussed with the patient.                            All questions were answered, and informed consent                            was obtained. Prior Anticoagulants: The patient has                            taken Eliquis (apixaban), last dose was 2 days                            prior to procedure. ASA Grade Assessment: III - A                            patient with severe systemic disease. After  reviewing the risks and benefits, the patient was                            deemed in satisfactory condition to undergo the                            procedure.                           After obtaining informed consent, the coloscope was                            passed under direct vision. Throughout the                            procedure, the patient's blood pressure,  pulse, and                            oxygen saturations were monitored continuously. The                            EC-[id number] ([id number]) scope was introduced through                            the mouth, and advanced to the second part of                            duodenum. The upper GI endoscopy was accomplished                            without difficultywith a pediatric colonoscope. The                            patient tolerated the procedure well. Scope In: Scope Out: Findings:      The Z-line was irregular and was found 38 cm from the incisors.      A medium amount of food (residue) was found in the gastric body.      A benign-appearing, intrinsic severe stenosis was found at the pylorus.       This was traversed. A TTS dilator was passed through the scope. Dilation       with a 15 mm pyloric balloon dilator was performed. The dilation site       was examined following endoscope reinsertion and showed moderate       improvement in luminal narrowing.      The examined duodenum was normal.      Retroflexion reveals a moderate amount of retained semiliquid and       semisolid food.      Small, superficial ulcers noted scattered in pylorus and gastric body.      The shape of the stomach was extremely "J" shaped. With the pyloric       stenosis and pyloric channel deformity significant looping was       encountered despite using a pediatric colonoscope. Impression:               - Z-line irregular, 38 cm from the incisors.                           -  A medium amount of food (residue) in the stomach.                           - Gastric stenosis was found at the pylorus.                            Dilated.                           - Normal examined duodenum.                           - No specimens collected. Moderate Sedation:      Patient did not receive moderate sedation for this procedure, but       instead received monitored anesthesia care. Recommendation:           -  Patient has a contact number available for                            emergencies. The signs and symptoms of potential                            delayed complications were discussed with the                            patient. Return to normal activities tomorrow.                            Written discharge instructions were provided to the                            patient.                           - Low fiber diet.                           - Continue present medications.                           - Repeat upper endoscopy in 2 months for                            retreatment. Procedure Code(s):        --- Professional ---                           [phone], Esophagogastroduodenoscopy, flexible,                            transoral; with dilation of gastric/duodenal                            stricture(s) (eg, balloon, bougie) Diagnosis Code(s):        --- Professional ---  K22.8, Other specified diseases of esophagus                           K31.1, Adult hypertrophic pyloric stenosis CPT copyright 2016 American Medical Association. All rights reserved. The codes documented in this report are preliminary and upon coder review may  be revised to meet current compliance requirements. [name], MD 01/24/2018 8:32:34 AM This report has been signed electronically. Number of Addenda: 0

## 2018-01-27 ENCOUNTER — Encounter (HOSPITAL_COMMUNITY): Payer: Self-pay | Admitting: Gastroenterology

## 2018-01-29 ENCOUNTER — Encounter: Payer: Self-pay | Admitting: Physical Medicine & Rehabilitation

## 2018-01-29 ENCOUNTER — Encounter: Payer: Medicare Other | Attending: Registered Nurse | Admitting: Physical Medicine & Rehabilitation

## 2018-01-29 DIAGNOSIS — R200 Anesthesia of skin: Secondary | ICD-10-CM | POA: Diagnosis not present

## 2018-01-29 DIAGNOSIS — M25511 Pain in right shoulder: Secondary | ICD-10-CM | POA: Diagnosis not present

## 2018-01-29 DIAGNOSIS — M05711 Rheumatoid arthritis with rheumatoid factor of right shoulder without organ or systems involvement: Secondary | ICD-10-CM

## 2018-01-29 DIAGNOSIS — M4316 Spondylolisthesis, lumbar region: Secondary | ICD-10-CM | POA: Diagnosis not present

## 2018-01-29 DIAGNOSIS — M25521 Pain in right elbow: Secondary | ICD-10-CM | POA: Insufficient documentation

## 2018-01-29 DIAGNOSIS — M05712 Rheumatoid arthritis with rheumatoid factor of left shoulder without organ or systems involvement: Secondary | ICD-10-CM | POA: Diagnosis not present

## 2018-01-29 DIAGNOSIS — F329 Major depressive disorder, single episode, unspecified: Secondary | ICD-10-CM | POA: Diagnosis not present

## 2018-01-29 DIAGNOSIS — G8929 Other chronic pain: Secondary | ICD-10-CM | POA: Diagnosis not present

## 2018-01-29 DIAGNOSIS — M4802 Spinal stenosis, cervical region: Secondary | ICD-10-CM | POA: Diagnosis not present

## 2018-01-29 DIAGNOSIS — F419 Anxiety disorder, unspecified: Secondary | ICD-10-CM | POA: Insufficient documentation

## 2018-01-29 DIAGNOSIS — M25522 Pain in left elbow: Secondary | ICD-10-CM | POA: Diagnosis not present

## 2018-01-29 DIAGNOSIS — Z760 Encounter for issue of repeat prescription: Secondary | ICD-10-CM | POA: Insufficient documentation

## 2018-01-29 DIAGNOSIS — M25512 Pain in left shoulder: Secondary | ICD-10-CM | POA: Insufficient documentation

## 2018-01-29 DIAGNOSIS — R42 Dizziness and giddiness: Secondary | ICD-10-CM | POA: Insufficient documentation

## 2018-01-29 DIAGNOSIS — M545 Low back pain: Secondary | ICD-10-CM | POA: Diagnosis not present

## 2018-01-29 DIAGNOSIS — M4712 Other spondylosis with myelopathy, cervical region: Secondary | ICD-10-CM | POA: Diagnosis not present

## 2018-01-29 DIAGNOSIS — M069 Rheumatoid arthritis, unspecified: Secondary | ICD-10-CM | POA: Diagnosis not present

## 2018-01-29 MED ORDER — OXYCODONE-ACETAMINOPHEN 7.5-325 MG PO TABS: 1.0000 | ORAL_TABLET | Freq: Four times a day (QID) | ORAL | 0 refills | Status: DC | PRN

## 2018-01-29 MED ORDER — DICLOFENAC SODIUM 1 % TD GEL: TRANSDERMAL | 4 refills | Status: AC

## 2018-01-29 NOTE — Progress Notes (Signed)
Subjective:    Patient ID: Angela [name], female    DOB: 12/21/1935, 82 y.o.   MRN: [id number]  HPI  Angela [name] is here in follow-up of her chronic pain.  Since I last saw her she has been fairly stable.  She does have more problems in the colder and damper weather.  She is looking forward to warmer weather of the spring.  She has complained of increased right shoulder pain.  She plans on seeing her rheumatologist for an injection.  She does try to maintain activity and range of motion for the shoulder as tolerated.  Using her walker for ambulation.  She has an Environmental consultant who helps her with meds and things around the house.  For pain she is taking Percocet 7.5/325 1 every 6 hours as needed.  This seems to help quite a bit and keeping her pain under control.  She also likes to utilize her Lidoderm patches and Voltaren gel.  Pain Inventory Average Pain 8 Pain Right Now 7 My pain is sharp, dull and aching  In the last 24 hours, has pain interfered with the following? General activity 7 Relation with others 7 Enjoyment of life 7 What TIME of day is your pain at its worst? evening Sleep (in general) Good  Pain is worse with: weather Pain improves with: heat/ice, medication and injections Relief from Meds: 8  Mobility walk with assistance use a walker ability to climb steps?  no do you drive?  no use a wheelchair transfers alone Do you have any goals in this area?  yes  Function retired I need assistance with the following:  dressing, bathing, meal prep, household duties and shopping  Neuro/Psych bladder control problems numbness trouble walking anxiety  Prior Studies Any changes since last visit?  no  Physicians involved in your care Any changes since last visit?  no   Family History  Problem Relation Age of Onset  . Lung cancer Mother   . Arrhythmia Mother   . Melanoma Brother   . Heart attack Neg Hx    Social History   Socioeconomic History  . Marital  status: Widowed    Spouse name: Not on file  . Number of children: Not on file  . Years of education: Not on file  . Highest education level: Not on file  Occupational History  . Not on file  Social Needs  . Financial resource strain: Not on file  . Food insecurity:    Worry: Not on file    Inability: Not on file  . Transportation needs:    Medical: Not on file    Non-medical: Not on file  Tobacco Use  . Smoking status: Never Smoker  . Smokeless tobacco: Never Used  Substance and Sexual Activity  . Alcohol use: No  . Drug use: No  . Sexual activity: Not on file  Lifestyle  . Physical activity:    Days per week: Not on file    Minutes per session: Not on file  . Stress: Not on file  Relationships  . Social connections:    Talks on phone: Not on file    Gets together: Not on file    Attends religious service: Not on file    Active member of club or organization: Not on file    Attends meetings of clubs or organizations: Not on file    Relationship status: Not on file  Other Topics Concern  . Not on file  Social History Narrative  .  Not on file   Past Surgical History:  Procedure Laterality Date  . ANKLE FUSION Bilateral    x2 both  . BALLOON DILATION N/A 01/10/2018   Procedure: BALLOON DILATION;  Surgeon: [name], MD;  Location: [place];  Service: Gastroenterology;  Laterality: N/A;  . BALLOON DILATION N/A 01/24/2018   Procedure: Larrie Kass DILATION;  Surgeon: [name], MD;  Location: [place];  Service: Gastroenterology;  Laterality: N/A;  W/ Fluoroscopy. Please use colonoscope.  Marland Kitchen CARDIAC CATHETERIZATION  09/2012   patent LAD stent and otherwise normal coronary arteries  . CARDIAC CATHETERIZATION N/A 03/07/2016   Procedure: Left Heart Cath and Coronary Angiography;  Surgeon: Peter M Martinique, MD;  Location: [place] [facility];  Service: Cardiovascular;  Laterality: N/A;  . CATARACT EXTRACTION, BILATERAL Bilateral   . CERVICAL FUSION     limited  ROM  .  CHOLECYSTECTOMY    . ESOPHAGOGASTRODUODENOSCOPY N/A 01/07/2016   Procedure: ESOPHAGOGASTRODUODENOSCOPY (EGD);  Surgeon: Wilford Corner, MD;  Location: [facility];  Service: Endoscopy;  Laterality: N/A;  . ESOPHAGOGASTRODUODENOSCOPY N/A 01/10/2018   Procedure: ESOPHAGOGASTRODUODENOSCOPY (EGD);  Surgeon: [name], MD;  Location: [place];  Service: Gastroenterology;  Laterality: N/A;  . ESOPHAGOGASTRODUODENOSCOPY N/A 01/24/2018   Procedure: ESOPHAGOGASTRODUODENOSCOPY (EGD);  Surgeon: [name], MD;  Location: [place];  Service: Gastroenterology;  Laterality: N/A;  . ESOPHAGOGASTRODUODENOSCOPY (EGD) WITH PROPOFOL N/A 11/01/2015   Procedure: ESOPHAGOGASTRODUODENOSCOPY (EGD) WITH PROPOFOL w/ Dialtation;  Surgeon: Garlan Fair, MD;  Location: [facility];  Service: Endoscopy;  Laterality: N/A;  . FRACTURE SURGERY  09/2011   left ankle screw and pin removed  . HAND SURGERY Bilateral    x4 digits 2-5 both hands  . JOINT REPLACEMENT Bilateral    BTKA  . TONSILLECTOMY     Past Medical History:  Diagnosis Date  . A-fib ([place])   . Aortic stenosis    mild by echo 03/2012  . Asthma   . CAD (coronary artery disease)    s/p PCI with BMS to mid LAD--2/08 not on aspirin due to frequent ulcers.  Repeat cath for CP 09/2012 with patent LAD stent and otherwise normal coronary arteries  . Cervical stenosis of spine   . Depression   . Dyslipidemia   . Elbow fracture, right    history of- never any surgery  . GERD (gastroesophageal reflux disease)   . Headache   . Hypertension   . Impaired physical mobility    due to rheumatoid arthritis "ambulates with walker" limited free standing.  . Intestinal polyp   . Macular degeneration   . Obesity   . Osteoporosis    alendronate since 2012  . PUD (peptic ulcer disease)    can not take aspirin  . PVC's (premature ventricular contractions)   . RA (rheumatoid arthritis) ([place])   . Rheumatoid arthritis(714.0)    Dr. Lenna Gilford follows  .  Spondylolisthesis of lumbar region    BP 97/67   Pulse 94   Resp 14   Ht 4\' 11"  (1.499 m)   Wt 108 lb (49 kg)   SpO2 97%   BMI 21.81 kg/m   Opioid Risk Score:   Fall Risk Score:  `1  Depression screen PHQ 2/9  Depression screen [facility] 2/9 07/04/2016 02/14/2015  Decreased Interest 0 2  Down, Depressed, Hopeless 1 2  PHQ - 2 Score 1 4  Altered sleeping - 2  Tired, decreased energy - 3  Change in appetite - 2  Feeling bad or failure about yourself  -  2  Trouble concentrating - 2  Moving slowly or fidgety/restless - 1  Suicidal thoughts - 0  PHQ-9 Score - 16    Review of Systems  Constitutional: Negative.   HENT: Negative.   Eyes: Negative.   Respiratory: Negative.   Cardiovascular: Negative.   Gastrointestinal: Positive for constipation.  Endocrine: Negative.   Genitourinary: Negative.   Musculoskeletal: Positive for arthralgias, back pain, gait problem and neck pain.  Skin: Negative.   Allergic/Immunologic: Negative.   Neurological: Positive for numbness.  Hematological: Bruises/bleeds easily.  Psychiatric/Behavioral: The patient is nervous/anxious.   All other systems reviewed and are negative.      Objective:   Physical Exam  General: No acute distress HEENT: EOMI, oral membranes moist Cards: reg rate  Chest: normal effort Abdomen: Soft, NT, ND Skin: dry, intact, chronic lesions. Extremities: no edema   M/S: low back tender to palpation. Needs extra time to stand. Posture is head forward, kyphotic. both remain limited ER/IR but has surprisingly preserved movement of right shoulder given her pain. RA deformities in hands. Psychiatric:Pleasant and appropriate   Assessment & Plan:  ASSESSMENT:  1. History of cervical stenosis/spondylosis with myelopathy.  2. Rheumatoid arthritis.  3. Lumbar spondylolisthesis.  4. Gait disorder related to the above, 5. Pyloric stenosis: s/p dilation    PLAN:  1. She was givenone refillprescription for  Percocet 7.5/325 1 p.o. q.6  hours p.r.n., 120. Second rx for next month.    -We will continue the opioid monitoring program, this consists of regular clinic visits, examinations, routine drug screening, pill counts as well as use of [place] Controlled Substance Reporting System. [place] today.   2. Continue with lidoderm and voltaren gel.  3.continue HEP. Shoulder injection. 4.Compression stockings/elevation for legs    Follow up in29months with me or NP.21minutes of face to face patient care time were spent during this visit. All questions were encouraged and answered.

## 2018-01-29 NOTE — Patient Instructions (Signed)
PLEASE FEEL FREE TO CALL OUR OFFICE WITH ANY PROBLEMS OR QUESTIONS ([phone])      

## 2018-02-03 ENCOUNTER — Other Ambulatory Visit: Payer: Self-pay | Admitting: Gastroenterology

## 2018-02-03 DIAGNOSIS — K311 Adult hypertrophic pyloric stenosis: Secondary | ICD-10-CM | POA: Diagnosis not present

## 2018-02-04 ENCOUNTER — Telehealth: Payer: Self-pay

## 2018-02-04 NOTE — Telephone Encounter (Signed)
   [facility] Pre-operative Risk Assessment    Request for surgical clearance:  1. What type of surgery is being performed? Endoscopy   2. When is this surgery scheduled? 04/15/2018   3. What type of clearance is required (medical clearance vs. Pharmacy clearance to hold med vs. Both)? Pharmacy  4. Are there any medications that need to be held prior to surgery and how long?Eliquis, one day before  5. Practice name and name of physician performing surgery? [facility]; Dr Therisa Doyne   6. What is your office phone and fax number? P: [phone]  F:[phone]   7. Anesthesia type (None, local, MAC, general) ? unknown   Angela [name] 02/04/2018, 12:45 PM  _________________________________________________________________   (provider comments below)

## 2018-02-06 ENCOUNTER — Other Ambulatory Visit: Payer: Self-pay | Admitting: *Deleted

## 2018-02-06 NOTE — Telephone Encounter (Signed)
Patient with diagnosis of atrial fibrillation on Eliquis for anticoagulation.    Procedure: endoscopy Date of procedure: 04/15/18  CHADS2-VASc score of  6 (CHF, HTN, AGE, , CAD, AGE, female)  CrCl 73.5 Platelet count 185  Per office protocol, patient can hold Eliquis for 1 days prior to procedure.    Patient should restart Eliquis on the evening of procedure or day after, at discretion of procedure MD

## 2018-02-07 NOTE — Telephone Encounter (Signed)
Prior authorization submitted and approved for diclofenac sodium 1% gel.

## 2018-02-07 NOTE — Telephone Encounter (Signed)
Pharm D's recommendations sent to Dr Encarnacion Slates office.

## 2018-02-10 ENCOUNTER — Other Ambulatory Visit: Payer: Self-pay | Admitting: Cardiovascular Disease

## 2018-02-10 DIAGNOSIS — R600 Localized edema: Secondary | ICD-10-CM

## 2018-02-15 ENCOUNTER — Other Ambulatory Visit: Payer: Self-pay | Admitting: Physical Medicine & Rehabilitation

## 2018-02-15 DIAGNOSIS — F329 Major depressive disorder, single episode, unspecified: Secondary | ICD-10-CM

## 2018-02-18 ENCOUNTER — Telehealth: Payer: Self-pay

## 2018-02-18 NOTE — Telephone Encounter (Signed)
Waiting on response from provider to see which is correct dosing schedule for this medication.

## 2018-02-18 NOTE — Telephone Encounter (Signed)
It's rxed in the evening but if she wants to continue taking in the morning, that's fine

## 2018-02-18 NOTE — Telephone Encounter (Signed)
Recieved electronic medication refill request for Lexapro, according to current prescription:  Take 1 Tab QHS.  According to recent visit, patients reports is taking it:  Take 1 Tab in morning.  Which is the correct dosing schedule for this patient for this medication, please advise.

## 2018-02-19 NOTE — Telephone Encounter (Signed)
Recieved feedback from provider and patient.  Reordering medication for use in AM

## 2018-02-19 NOTE — Telephone Encounter (Signed)
Called patient to see how she wants to take it, she stated it does better for her in the mornings., will order for her as such.

## 2018-03-05 ENCOUNTER — Encounter (HOSPITAL_COMMUNITY): Payer: Self-pay

## 2018-03-05 ENCOUNTER — Telehealth: Payer: Self-pay | Admitting: Physician Assistant

## 2018-03-05 ENCOUNTER — Emergency Department (HOSPITAL_COMMUNITY)
Admission: EM | Admit: 2018-03-05 | Discharge: 2018-03-05 | Disposition: A | Discharge status: Home / Self Care | Payer: Medicare Other | Attending: Emergency Medicine | Admitting: Emergency Medicine

## 2018-03-05 ENCOUNTER — Other Ambulatory Visit: Payer: Self-pay

## 2018-03-05 ENCOUNTER — Emergency Department (HOSPITAL_COMMUNITY): Payer: Medicare Other

## 2018-03-05 DIAGNOSIS — N390 Urinary tract infection, site not specified: Secondary | ICD-10-CM | POA: Diagnosis not present

## 2018-03-05 DIAGNOSIS — I2510 Atherosclerotic heart disease of native coronary artery without angina pectoris: Secondary | ICD-10-CM | POA: Insufficient documentation

## 2018-03-05 DIAGNOSIS — I4891 Unspecified atrial fibrillation: Secondary | ICD-10-CM | POA: Diagnosis not present

## 2018-03-05 DIAGNOSIS — Z681 Body mass index (BMI) 19 or less, adult: Secondary | ICD-10-CM | POA: Diagnosis not present

## 2018-03-05 DIAGNOSIS — K5903 Drug induced constipation: Secondary | ICD-10-CM | POA: Diagnosis not present

## 2018-03-05 DIAGNOSIS — I950 Idiopathic hypotension: Secondary | ICD-10-CM | POA: Diagnosis not present

## 2018-03-05 DIAGNOSIS — I480 Paroxysmal atrial fibrillation: Secondary | ICD-10-CM

## 2018-03-05 DIAGNOSIS — Z79899 Other long term (current) drug therapy: Secondary | ICD-10-CM | POA: Insufficient documentation

## 2018-03-05 DIAGNOSIS — R5383 Other fatigue: Secondary | ICD-10-CM | POA: Diagnosis not present

## 2018-03-05 DIAGNOSIS — R1030 Lower abdominal pain, unspecified: Secondary | ICD-10-CM | POA: Diagnosis not present

## 2018-03-05 DIAGNOSIS — M2550 Pain in unspecified joint: Secondary | ICD-10-CM | POA: Diagnosis not present

## 2018-03-05 DIAGNOSIS — R079 Chest pain, unspecified: Secondary | ICD-10-CM | POA: Diagnosis not present

## 2018-03-05 DIAGNOSIS — R002 Palpitations: Secondary | ICD-10-CM | POA: Diagnosis not present

## 2018-03-05 DIAGNOSIS — I119 Hypertensive heart disease without heart failure: Secondary | ICD-10-CM | POA: Insufficient documentation

## 2018-03-05 DIAGNOSIS — M0579 Rheumatoid arthritis with rheumatoid factor of multiple sites without organ or systems involvement: Secondary | ICD-10-CM | POA: Diagnosis not present

## 2018-03-05 DIAGNOSIS — M25511 Pain in right shoulder: Secondary | ICD-10-CM | POA: Diagnosis not present

## 2018-03-05 HISTORY — DX: Unspecified atrial fibrillation: I48.91

## 2018-03-05 LAB — URINALYSIS, ROUTINE W REFLEX MICROSCOPIC
BACTERIA UA: NONE SEEN
Bilirubin Urine: NEGATIVE
GLUCOSE, UA: NEGATIVE mg/dL
Ketones, ur: 5 mg/dL — AB
NITRITE: NEGATIVE
PROTEIN: NEGATIVE mg/dL
Specific Gravity, Urine: 1.013 (ref 1.005–1.030)
pH: 5 (ref 5.0–8.0)

## 2018-03-05 LAB — BASIC METABOLIC PANEL
Anion gap: 14 (ref 5–15)
BUN: 16 mg/dL (ref 6–20)
CALCIUM: 9.1 mg/dL (ref 8.9–10.3)
CHLORIDE: 103 mmol/L (ref 101–111)
CO2: 19 mmol/L — AB (ref 22–32)
Creatinine, Ser: 0.57 mg/dL (ref 0.44–1.00)
GFR calc non Af Amer: >60 mL/min (ref >60)
Glucose, Bld: 110 mg/dL — ABNORMAL HIGH (ref 65–99)
POTASSIUM: 3.9 mmol/L (ref 3.5–5.1)
Sodium: 136 mmol/L (ref 135–145)

## 2018-03-05 LAB — CBC
HCT: 40.7 % (ref 36.0–46.0)
Hemoglobin: 14.2 g/dL (ref 12.0–15.0)
MCH: 37.2 pg — AB (ref 26.0–34.0)
MCHC: 34.9 g/dL (ref 30.0–36.0)
MCV: 106.5 fL — AB (ref 78.0–100.0)
Platelets: 163 10*3/uL (ref 150–400)
RBC: 3.82 MIL/uL — AB (ref 3.87–5.11)
RDW: 13.9 % (ref 11.5–15.5)
WBC: 6.1 10*3/uL (ref 4.0–10.5)

## 2018-03-05 LAB — I-STAT TROPONIN, ED: TROPONIN I, POC: 0 ng/mL (ref 0.00–0.08)

## 2018-03-05 MED ORDER — CEPHALEXIN 500 MG PO CAPS: 500.0000 mg | ORAL_CAPSULE | Freq: Two times a day (BID) | ORAL | 0 refills | Status: AC

## 2018-03-05 NOTE — ED Notes (Signed)
ED Provider at bedside. 

## 2018-03-05 NOTE — Telephone Encounter (Signed)
The patient called the answering service after-hours today looking for advice for what to do. She saw her rheumatologist today who noted she was tachycardic with a HR of 112. A CBC was drawn but results unknown. She has a history of atrial flutter, atrial fibrillation, and atrial tachycardia. It appears that several of these episodes have occurred in the setting of other acute illness. She also has had issues with low blood pressure. She made an appointment with our office for Monday but is feeling very dizzy. She has been compliant with Eliquis without interruption for over 30 days. She has h/o LV dysfunction as well as moderate AS so I do not feel comfortable with her waiting until Monday to have this addressed -> if it is a recurrent arrhythmia, DCCV could be considered. Last BP 088 systolic. She needs to be evaluated to make sure no acute bleeding causing new tachycardia given Eliquis use and dizziness. The patient verbalized understanding and gratitude - her caregiver will drive her.  Jaiyla Granados PA-C

## 2018-03-05 NOTE — ED Triage Notes (Signed)
Pt endorses dizziness and intermittent chest pain x 3 days, sent by Rheumatologist due to irregular HR, pt has hx of a-fib. Pt in a-fib here in the 110s. Pt is on eliquis for a-fib.

## 2018-03-05 NOTE — ED Provider Notes (Signed)
[facility] EMERGENCY DEPARTMENT Provider Note   CSN: [id number] Arrival date & time: 03/05/18  1938     History   Chief Complaint Chief Complaint  Patient presents with  . Atrial Fibrillation  . Dizziness    HPI Angela [name] is a 82 y.o. female.  HPI   Having rapid heart rate for several days, went to rheumatologist and it was 112, called Cardiologist who couldn't see her until Monday and said that she should come for evaluation with the afib Feeling dizzy and worn out.  Feeling lightheaded for a few days.  Sitting down helps, put feet up which helps.   No shortness of breath.  Last few days has been trying to ride exercise bike. Didn't know if in afib or not, can't tell. Did feel fatigued and no energy.  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  Did drink some tea   Past Medical History:  Diagnosis Date  . A-fib ([place])   . Aortic stenosis    mild by echo 03/2012  . Asthma   . Atrial fibrillation ([place])   . CAD (coronary artery disease)    s/p PCI with BMS to mid LAD--2/08 not on aspirin due to frequent ulcers.  Repeat cath for CP 09/2012 with patent LAD stent and otherwise normal coronary arteries  . Cervical stenosis of spine   . Depression   . Dyslipidemia   . Elbow fracture, right    history of- never any surgery  . GERD (gastroesophageal reflux disease)   . Headache   . Hypertension   . Impaired physical mobility    due to rheumatoid arthritis "ambulates with walker" limited free standing.  . Intestinal polyp   . Macular degeneration   . Obesity   . Osteoporosis    alendronate since 2012  . PUD (peptic ulcer disease)    can not take aspirin  . PVC's (premature ventricular contractions)   . RA (rheumatoid arthritis) ([place])   . Rheumatoid arthritis(714.0)    Dr. Lenna Gilford follows  . Spondylolisthesis of lumbar region     Patient Active Problem List   Diagnosis Date Noted  . Chronic pain syndrome 12/02/2017  . Reactive depression 07/11/2016  . Abnormal  nuclear stress test 03/07/2016  . Cardiomyopathy ([place]) 01/30/2016  . Atrial flutter ([place]) 01/16/2016  . SVT (supraventricular tachycardia) ([place]) 01/16/2016  . Duodenitis 01/16/2016  . Atrial fibrillation ([place]) 01/13/2016  . Protein-calorie malnutrition, severe 01/11/2016  . Gastric outlet obstruction 01/06/2016  . Gastroparesis 01/06/2016  . PAF (paroxysmal atrial fibrillation) ([place]) 01/06/2016  . Constipation 07/20/2015  . Edema extremities 02/18/2014  . Aortic stenosis   . CAD (coronary artery disease)   . PVC's (premature ventricular contractions) 11/03/2013  . Dyslipidemia 11/03/2013  . Essential hypertension, benign 11/03/2013  . Acute renal failure ([place]) 06/15/2013  . Hyponatremia 06/15/2013  . Diarrhea in adult patient 06/15/2013  . Acidosis 06/15/2013  . Cervical spondylosis with myelopathy 01/29/2012  . Rheumatoid arthritis ([place]) 01/29/2012  . Spondylolisthesis of lumbar region 01/29/2012    Past Surgical History:  Procedure Laterality Date  . ANKLE FUSION Bilateral    x2 both  . BALLOON DILATION N/A 01/10/2018   Procedure: BALLOON DILATION;  Surgeon: [name], MD;  Location: [facility];  Service: Gastroenterology;  Laterality: N/A;  . BALLOON DILATION N/A 01/24/2018   Procedure: Larrie Kass DILATION;  Surgeon: [name], MD;  Location: [facility];  Service: Gastroenterology;  Laterality: N/A;  W/ Fluoroscopy. Please use colonoscope.  Marland Kitchen CARDIAC CATHETERIZATION  09/2012  patent LAD stent and otherwise normal coronary arteries  . CARDIAC CATHETERIZATION N/A 03/07/2016   Procedure: Left Heart Cath and Coronary Angiography;  Surgeon: Peter M Martinique, MD;  Location: [facility];  Service: Cardiovascular;  Laterality: N/A;  . CATARACT EXTRACTION, BILATERAL Bilateral   . CERVICAL FUSION     limited  ROM  . CHOLECYSTECTOMY    . ESOPHAGOGASTRODUODENOSCOPY N/A 01/07/2016   Procedure: ESOPHAGOGASTRODUODENOSCOPY (EGD);  Surgeon: Wilford Corner, MD;  Location: [facility];  Service: Endoscopy;  Laterality: N/A;  . ESOPHAGOGASTRODUODENOSCOPY N/A 01/10/2018   Procedure: ESOPHAGOGASTRODUODENOSCOPY (EGD);  Surgeon: [name], MD;  Location: [facility];  Service: Gastroenterology;  Laterality: N/A;  . ESOPHAGOGASTRODUODENOSCOPY N/A 01/24/2018   Procedure: ESOPHAGOGASTRODUODENOSCOPY (EGD);  Surgeon: [name], MD;  Location: [facility];  Service: Gastroenterology;  Laterality: N/A;  . ESOPHAGOGASTRODUODENOSCOPY (EGD) WITH PROPOFOL N/A 11/01/2015   Procedure: ESOPHAGOGASTRODUODENOSCOPY (EGD) WITH PROPOFOL w/ Dialtation;  Surgeon: Garlan Fair, MD;  Location: [facility];  Service: Endoscopy;  Laterality: N/A;  . FRACTURE SURGERY  09/2011   left ankle screw and pin removed  . HAND SURGERY Bilateral    x4 digits 2-5 both hands  . JOINT REPLACEMENT Bilateral    BTKA  . TONSILLECTOMY       OB History   None      Home Medications    Prior to Admission medications   Medication Sig Start Date End Date Taking? Authorizing Provider  acebutolol (SECTRAL) 200 MG capsule Take 1 capsule (200 mg total) by mouth every morning. 07/31/17  Yes Daune Perch, NP  apixaban (ELIQUIS) 5 MG TABS tablet Take 1 tablet (5 mg total) by mouth 2 (two) times daily. 07/31/17  Yes Daune Perch, NP  butalbital-acetaminophen-caffeine (FIORICET, ESGIC) 50-325-40 MG per tablet Take 1 tablet by mouth every 6 (six) hours as needed for headache or migraine.  06/23/12  Yes [provider]  Calcium Carbonate-Vitamin D (CALCIUM 500+D PO) Take 1 tablet by mouth 2 (two) times daily.    Yes [provider]  DENTAGEL 1.1 % GEL dental gel Place 1 application onto teeth at bedtime.  12/12/15  Yes [provider]  diclofenac sodium (VOLTAREN) 1 % GEL Apply 2 grams topically at bedtime to shoulders and elbows 01/29/18  Yes Meredith Staggers, MD  digoxin (LANOXIN) 0.125 MG tablet Take 125 mcg by mouth daily. 01/03/18  Yes [provider]  Docusate Calcium  (STOOL SOFTENER PO) Take 1 tablet by mouth at bedtime.   Yes [provider]  escitalopram (LEXAPRO) 5 MG tablet Take 5 mg by mouth in the morning 02/19/18  Yes Meredith Staggers, MD  ferrous sulfate 325 (65 FE) MG tablet Take 325 mg by mouth daily with breakfast.   Yes [provider]  folic acid (FOLVITE) 1 MG tablet Take 1 mg by mouth daily.  12/03/13  Yes [provider]  furosemide (LASIX) 40 MG tablet TAKE 1 TABLET BY MOUTH EVERY DAY TAKE 1/2 TAB AS NEEDED FOR EXTRA SWELLING 02/11/18  Yes Nahser, Wonda Cheng, MD  lidocaine (LIDODERM) 5 % PLACE 3 PATCHES ONTO THE SKIN EVERY 12 (TWELVE) HOURS. APPLY NECK AND ELBOW Patient taking differently: Place 2 patches onto the skin every 12 (twelve) hours.  11/01/17  Yes Meredith Staggers, MD  LORazepam (ATIVAN) 2 MG tablet Take 2 mg by mouth at bedtime. 02/05/15  Yes [provider]  methotrexate (RHEUMATREX) 2.5 MG tablet Take 15 mg by mouth every Saturday.  03/01/12  Yes [provider]  nitroGLYCERIN (NITROSTAT) 0.4 MG SL tablet Place 1 tablet (0.4 mg total) under the tongue every 5 (five) minutes as needed for chest pain. 02/21/16  Yes Turner, Eber Hong, MD  nystatin cream (MYCOSTATIN) Apply 1 application topically daily as needed for dry skin.   Yes [provider]  oxyCODONE-acetaminophen (PERCOCET) 7.5-325 MG tablet Take 1 tablet by mouth every 6 (six) hours as needed for moderate pain. 01/29/18  Yes Meredith Staggers, MD  pantoprazole (PROTONIX) 40 MG tablet Take 40 mg by mouth 2 (two) times daily.    Yes [provider]  predniSONE (DELTASONE) 5 MG tablet Take 5 mg by mouth daily with breakfast.    Yes [provider]  PREVIDENT 0.2 % SOLN Take 1 each by mouth See admin instructions. Rinses with about 1 capful nightly 09/05/12  Yes [provider]  simvastatin (ZOCOR) 40 MG tablet TAKE 1 TABLET BY MOUTH AT BEDTIME Patient taking differently: TAKE 40 MG BY MOUTH AT BEDTIME 03/18/17   Yes Turner, Traci R, MD  TOVIAZ 8 MG TB24 tablet Take 8 mg by mouth daily. 10/25/15  Yes [provider]  Turmeric 500 MG CAPS Take 500 mg by mouth daily.   Yes [provider]  vitamin B-12 (CYANOCOBALAMIN) 1000 MCG tablet Take 1,000 mcg by mouth daily.   Yes [provider]  vitamin C (ASCORBIC ACID) 500 MG tablet Take 500 mg by mouth 2 (two) times daily.   Yes [provider]  cephALEXin (KEFLEX) 500 MG capsule Take 1 capsule (500 mg total) by mouth 2 (two) times daily for 7 days. 03/05/18 03/12/18  Gareth Morgan, MD  propranolol (INDERAL) 10 MG tablet Take 1 tablet (10 mg total) by mouth daily as needed (palpitations lasting more than 5 minutes). Patient not taking: Reported on 03/05/2018 01/16/16   Janece Canterbury, MD    Family History Family History  Problem Relation Age of Onset  . Lung cancer Mother   . Arrhythmia Mother   . Melanoma Brother   . Heart attack Neg Hx     Social History Social History   Tobacco Use  . Smoking status: Never Smoker  . Smokeless tobacco: Never Used  Substance Use Topics  . Alcohol use: No  . Drug use: No     Allergies   Peanut-containing drug products; Reglan [metoclopramide]; Clindamycin/lincomycin; Codeine; Iohexol; Pineapple; and Shellfish allergy   Review of Systems Review of Systems  Constitutional: Positive for fatigue. Negative for fever.  HENT: Negative for sore throat.   Eyes: Negative for visual disturbance.  Respiratory: Negative for cough and shortness of breath.   Cardiovascular: Positive for palpitations. Negative for chest pain.  Gastrointestinal: Negative for abdominal pain, nausea and vomiting.  Genitourinary: Negative for difficulty urinating and dysuria.  Musculoskeletal: Negative for back pain and neck pain.  Skin: Negative for rash.  Neurological: Positive for light-headedness. Negative for syncope and headaches.     Physical Exam Updated Vital Signs BP (!) 131/92   Pulse 98    Temp 98.7 F (37.1 C) (Oral)   Resp 19   Ht 5\' 2"  (1.575 m)   Wt 48.5 kg (107 lb)   SpO2 95%   BMI 19.57 kg/m   Physical Exam  Constitutional: She is oriented to person, place, and time. She appears well-developed and well-nourished. No distress.  HENT:  Head: Normocephalic and atraumatic.  Eyes: Conjunctivae and EOM are normal.  Neck: Normal range of motion.  Cardiovascular: Normal rate, normal heart sounds and  intact distal pulses. An irregularly irregular rhythm present. Exam reveals no gallop and no friction rub.  No murmur heard. Pulmonary/Chest: Effort normal and breath sounds normal. No respiratory distress. She has no wheezes. She has no rales.  Abdominal: Soft. She exhibits no distension. There is no tenderness. There is no guarding.  Musculoskeletal: She exhibits no edema or tenderness.  Neurological: She is alert and oriented to person, place, and time.  Skin: Skin is warm and dry. No rash noted. She is not diaphoretic. No erythema.  Nursing note and vitals reviewed.    ED Treatments / Results  Labs (all labs ordered are listed, but only abnormal results are displayed) Labs Reviewed  BASIC METABOLIC PANEL - Abnormal; Notable for the following components:      Result Value   CO2 19 (*)    Glucose, Bld 110 (*)    All other components within normal limits  CBC - Abnormal; Notable for the following components:   RBC 3.82 (*)    MCV 106.5 (*)    MCH 37.2 (*)    All other components within normal limits  URINALYSIS, ROUTINE W REFLEX MICROSCOPIC - Abnormal; Notable for the following components:   APPearance HAZY (*)    Hgb urine dipstick SMALL (*)    Ketones, ur 5 (*)    Leukocytes, UA MODERATE (*)    All other components within normal limits  URINE CULTURE  I-STAT TROPONIN, ED    EKG EKG Interpretation  Date/Time:  Wednesday Mar 05 2018 19:46:53 EDT Ventricular Rate:  119 PR Interval:    QRS Duration: 68 QT Interval:  318 QTC Calculation: 447 R  Axis:   75 Text Interpretation:  Atrial flutter with variable A-V block Anterior infarct , age undetermined Abnormal ECG No significant change since last tracing - more consistent with flutter than fib in comparison to prior Confirmed by Gareth Morgan [phone]) on 03/05/2018 8:50:19 PM   Radiology Dg Chest 2 View  Result Date: 03/05/2018 CLINICAL DATA:  82 year old female with a history of chest pain EXAM: CHEST - 2 VIEW COMPARISON:  06/18/2013, 06/17/2013, 06/15/2013 FINDINGS: Cardiomediastinal silhouette unchanged in size and contour. No evidence of central vascular congestion. No interlobular septal thickening. Stigmata of emphysema, with increased retrosternal airspace, flattened hemidiaphragms, increased AP diameter, and hyperinflation on the AP view. No pneumothorax or pleural effusion. No confluent airspace disease. Degenerative changes of the left shoulder IMPRESSION: Chronic changes and emphysema without evidence of superimposed acute cardiopulmonary disease Electronically Signed   By: Corrie Mckusick D.O.   On: 03/05/2018 20:51    Procedures Procedures (including critical care time)  Medications Ordered in ED Medications - No data to display   Initial Impression / Assessment and Plan / ED Course  I have reviewed the triage vital signs and the nursing notes.  Pertinent labs & imaging results that were available during my care of the patient were reviewed by me and considered in my medical decision making (see chart for details).     82yo female with history above presents with concern for atrial fibrillation.  Rate at rheumatologist was higher to 110s. In the emergency department, her HR has decreased and is typically 80s-90s and at times mild increase to low 100s.  Pt reports fatigue. No sign of anemia, electrolyte abnormalities, sepsis, ACS or CVA.  Fatigue may be secondary to patient's atrial fibrillation.  Afib possibly exacerbated by recent exercise, caffeine use, or possible UTI  seen on urinalysis. Given rate controlled in the ED, do  not recommend medication changes at this time and recommend Cardiology follow up  And treatment of underlying UTI and avoidance of other triggers.  Patient discharged in stable condition with understanding of reasons to return.   Final Clinical Impressions(s) / ED Diagnoses   Final diagnoses:  Paroxysmal atrial fibrillation ([place])  Urinary tract infection without hematuria, site unspecified    ED Discharge Orders        Ordered    cephALEXin (KEFLEX) 500 MG capsule  2 times daily     03/05/18 2319       Gareth Morgan, MD 03/06/18 [phone]

## 2018-03-05 NOTE — ED Notes (Signed)
Pt. Taken to x-ray. Will be brought to room from x-ray.

## 2018-03-06 NOTE — Telephone Encounter (Signed)
Pt was seen in the ER 

## 2018-03-07 LAB — URINE CULTURE

## 2018-03-10 ENCOUNTER — Telehealth: Payer: Self-pay | Admitting: Physician Assistant

## 2018-03-10 ENCOUNTER — Encounter: Payer: Self-pay | Admitting: Physician Assistant

## 2018-03-10 ENCOUNTER — Ambulatory Visit (INDEPENDENT_AMBULATORY_CARE_PROVIDER_SITE_OTHER): Payer: Medicare Other | Admitting: Physician Assistant

## 2018-03-10 VITALS — BP 92/62 | HR 127 | Ht 62.0 in | Wt 106.1 lb

## 2018-03-10 DIAGNOSIS — I350 Nonrheumatic aortic (valve) stenosis: Secondary | ICD-10-CM | POA: Diagnosis not present

## 2018-03-10 DIAGNOSIS — I429 Cardiomyopathy, unspecified: Secondary | ICD-10-CM | POA: Diagnosis not present

## 2018-03-10 DIAGNOSIS — I480 Paroxysmal atrial fibrillation: Secondary | ICD-10-CM | POA: Diagnosis not present

## 2018-03-10 DIAGNOSIS — R609 Edema, unspecified: Secondary | ICD-10-CM | POA: Diagnosis not present

## 2018-03-10 DIAGNOSIS — I2510 Atherosclerotic heart disease of native coronary artery without angina pectoris: Secondary | ICD-10-CM

## 2018-03-10 MED ORDER — PROPRANOLOL HCL 10 MG PO TABS: 10.0000 mg | ORAL_TABLET | ORAL | 6 refills | Status: AC | PRN

## 2018-03-10 MED ORDER — AMIODARONE HCL 200 MG PO TABS: 200.0000 mg | ORAL_TABLET | Freq: Every day | ORAL | 3 refills | Status: DC

## 2018-03-10 MED ORDER — ACEBUTOLOL HCL 200 MG PO CAPS: 100.0000 mg | ORAL_CAPSULE | Freq: Every day | ORAL | 3 refills | Status: AC

## 2018-03-10 NOTE — Telephone Encounter (Signed)
I s/w pt and advised per PA Vin Bhagat to continue on Acebutolol 200 mg daily, increase Lasix 40 mg BID x 3 days then resume lasix 40 mg daily. Pt thanked me for the call back and my help. I did stated to pt that I did send out a corrected medications list for her. Pt said thank you.

## 2018-03-10 NOTE — Patient Instructions (Addendum)
Medication Instructions:  1. Continue Acebutolol 200 mg daily  2. Start Amiodarone 200 mg daily. (new Rx has been sent in) 3. Refill sent in for Propranolol 10 mg 4. Increase Lasix 40 mg twice daily for 3 days. After 3 day resume Lasix 40 mg once a day.  Labwork: Today BMET,CBC   Testing/Procedures: Your physician has recommended that you have a Cardioversion (DCCV). Electrical Cardioversion uses a jolt of electricity to your heart either through paddles or wired patches attached to your chest. This is a controlled, usually prescheduled, procedure. Defibrillation is done under light anesthesia in the hospital, and you usually go home the day of the procedure. This is done to get your heart back into a normal rhythm. You are not awake for the procedure. Please see the instruction sheet given to you today.    Follow-Up: Post procedure follow up with Robbie Lis, PA May 23 at 11:00 a.m.  Any Other Special Instructions Will Be Listed Below (If Applicable).  If you need a refill on your cardiac medications before your next appointment, please call your pharmacy.   Dear Angela [name] You are scheduled for a Cardioversion on MAY 9,2019 with Dr. Stanford Breed @ 2 PM.  Please arrive at the [facility] (Main Entrance A) at [facility]: [place], [place] [address] at 1 PM (1 hour prior to procedure unless lab work is needed; if lab work is needed arrive 1.5 hours ahead)  DIET: Nothing to eat or drink after midnight except a sip of water with medications (see medication instructions below)  Medication Instructions: Hold LASIX THE MORNING OF PROCEDURE  Continue your anticoagulant: ELIQUIS ; DO NOT MISS ANY DOSES; IF SO PLEASE CALL THE OFFICE You will need to continue your anticoagulant after your procedure until you  are told by your  Provider that it is safe to stop   Labs: If patient is on Coumadin, patient needs pt/INR, CBC, BMET within 3 days (No pt/INR needed for patients  taking Xarelto, Eliquis, Pradaxa) For patients receiving anesthesia for TEE and all Cardioversion patients: BMET, CBC within 1 week  Come to: TODAY (Lab option #1) Come to the lab at [facility] between the hours of 8:00 am and 4:30 pm. You do not have to be fasting.   You must have a responsible person to drive you home and stay in the waiting area during your procedure. Failure to do so could result in cancellation.  Bring your insurance cards.  *Special Note: Every effort is made to have your procedure done on time. Occasionally there are emergencies that occur at the hospital that may cause delays. Please be patient if a delay does occur.

## 2018-03-10 NOTE — Progress Notes (Signed)
Your cardiology Office Note    Date:  03/10/2018   ID:  Angela [name], DOB [birth year], MRN [id number]  PCP:  [name], MD  Cardiologist:  Dr. Radford Pax >>>>Dr. Nahser  Chief Complaint: ER follow up for afib/aflutter   History of Present Illness:   Angela [name] is a 82 y.o. female CAD s/p prior PCI to the LAD, HL, HTN, PVCs, rheumatoid arthritis, peptic ulcer disease, aortic stenosis and paroxysmal atrial fibrillation/aflutter and chronic cardiomyopathy presents for afib.  She was seen for bradycardia and Holter in 8/15 showed NSR with PACs and PVCs and ATach.  Toprol was changed to Acebutolol.  Bradycardia resolved.  Last seen by Dr. Fransico Him in 4/16.  Prior echo suggested mod AS.  Repeat limited echo demonstrated just mild AS.   Admitted 3/17 with gastric outlet obstruction noted on EGD.  She had an NG tube placed and underwent EGD demonstrating a polypoid lesion in the pyloric channel with surrounding ulcer and edema.  Bx was neg for malignancy.  Hospital course was c/b AFlutter with RVR and she was seen by Cardiology.  Echo demonstrated mod AS, mod TR, EF 45%, LAE.  Acebutolol was increased to 400 bid.  She converted to NSR.   She also had episodes of PSVT. She was given prn Propranolol to use.  CHADS2-VASc=6 (CHF, vascular dz, female, age, HTN).  She was placed on Apixaban.    Given low LVEF of 45% (reduced from prior), he underwent stress test which was abnormal. Follow up [facility] 03/2016 showed non obstructive CAD and patent LAD stent.   Noted Elevated HR of 120s during office visit at Rheumatologist office. Sent to ER where HR sustained in 80s and send home with outpatient cardiology follow up.   She is in atrial fibrillation for past 7 to 10 days.  Heart rate in 100-120s.  She is not taking digoxin for past 6 to 8 months.  She then out of PRN propranolol.  She has tachypnea with atrial fibrillation.  Has noted lower extremity edema.  Denies chest pain or syncope.  Denies  orthopnea, PND, syncope, melena or blood in her stool or urine. Patient has a history of gastric outlet obstruction.  She has lost 40 pounds in past 1 year.  She has upcoming surgery to opening a port with larger sites.   Past Medical History:  Diagnosis Date  . A-fib ([place])   . Aortic stenosis    mild by echo 03/2012  . Asthma   . Atrial fibrillation ([place])   . CAD (coronary artery disease)    s/p PCI with BMS to mid LAD--2/08 not on aspirin due to frequent ulcers.  Repeat cath for CP 09/2012 with patent LAD stent and otherwise normal coronary arteries  . Cervical stenosis of spine   . Depression   . Dyslipidemia   . Elbow fracture, right    history of- never any surgery  . GERD (gastroesophageal reflux disease)   . Headache   . Hypertension   . Impaired physical mobility    due to rheumatoid arthritis "ambulates with walker" limited free standing.  . Intestinal polyp   . Macular degeneration   . Obesity   . Osteoporosis    alendronate since 2012  . PUD (peptic ulcer disease)    can not take aspirin  . PVC's (premature ventricular contractions)   . RA (rheumatoid arthritis) ([place])   . Rheumatoid arthritis(714.0)    Dr. Lenna Gilford follows  . Spondylolisthesis of lumbar region  Past Surgical History:  Procedure Laterality Date  . ANKLE FUSION Bilateral    x2 both  . BALLOON DILATION N/A 01/10/2018   Procedure: BALLOON DILATION;  Surgeon: [name], MD;  Location: [facility];  Service: Gastroenterology;  Laterality: N/A;  . BALLOON DILATION N/A 01/24/2018   Procedure: Larrie Kass DILATION;  Surgeon: [name], MD;  Location: [place];  Service: Gastroenterology;  Laterality: N/A;  W/ Fluoroscopy. Please use colonoscope.  Marland Kitchen CARDIAC CATHETERIZATION  09/2012   patent LAD stent and otherwise normal coronary arteries  . CARDIAC CATHETERIZATION N/A 03/07/2016   Procedure: Left Heart Cath and Coronary Angiography;  Surgeon: Peter M Martinique, MD;  Location: [place] [facility];   Service: Cardiovascular;  Laterality: N/A;  . CATARACT EXTRACTION, BILATERAL Bilateral   . CERVICAL FUSION     limited  ROM  . CHOLECYSTECTOMY    . ESOPHAGOGASTRODUODENOSCOPY N/A 01/07/2016   Procedure: ESOPHAGOGASTRODUODENOSCOPY (EGD);  Surgeon: Wilford Corner, MD;  Location: [facility];  Service: Endoscopy;  Laterality: N/A;  . ESOPHAGOGASTRODUODENOSCOPY N/A 01/10/2018   Procedure: ESOPHAGOGASTRODUODENOSCOPY (EGD);  Surgeon: [name], MD;  Location: [facility];  Service: Gastroenterology;  Laterality: N/A;  . ESOPHAGOGASTRODUODENOSCOPY N/A 01/24/2018   Procedure: ESOPHAGOGASTRODUODENOSCOPY (EGD);  Surgeon: [name], MD;  Location: [facility];  Service: Gastroenterology;  Laterality: N/A;  . ESOPHAGOGASTRODUODENOSCOPY (EGD) WITH PROPOFOL N/A 11/01/2015   Procedure: ESOPHAGOGASTRODUODENOSCOPY (EGD) WITH PROPOFOL w/ Dialtation;  Surgeon: Garlan Fair, MD;  Location: [facility];  Service: Endoscopy;  Laterality: N/A;  . FRACTURE SURGERY  09/2011   left ankle screw and pin removed  . HAND SURGERY Bilateral    x4 digits 2-5 both hands  . JOINT REPLACEMENT Bilateral    BTKA  . TONSILLECTOMY      Current Medications: Prior to Admission medications   Medication Sig Start Date End Date Taking? Authorizing Provider  acebutolol (SECTRAL) 200 MG capsule Take 1 capsule (200 mg total) by mouth every morning. 07/31/17   Daune Perch, NP  apixaban (ELIQUIS) 5 MG TABS tablet Take 1 tablet (5 mg total) by mouth 2 (two) times daily. 07/31/17   Daune Perch, NP  butalbital-acetaminophen-caffeine (FIORICET, ESGIC) [phone] MG per tablet Take 1 tablet by mouth every 6 (six) hours as needed for headache or migraine.  06/23/12   [provider]  Calcium Carbonate-Vitamin D (CALCIUM 500+D PO) Take 1 tablet by mouth 2 (two) times daily.     [provider]  cephALEXin (KEFLEX) 500 MG capsule Take 1 capsule (500 mg total) by mouth 2 (two) times daily for 7 days. 03/05/18 03/12/18   Gareth Morgan, MD  DENTAGEL 1.1 % GEL dental gel Place 1 application onto teeth at bedtime.  12/12/15   [provider]  diclofenac sodium (VOLTAREN) 1 % GEL Apply 2 grams topically at bedtime to shoulders and elbows 01/29/18   Meredith Staggers, MD  digoxin (LANOXIN) 0.125 MG tablet Take 125 mcg by mouth daily. 01/03/18   [provider]  Docusate Calcium (STOOL SOFTENER PO) Take 1 tablet by mouth at bedtime.    [provider]  escitalopram (LEXAPRO) 5 MG tablet Take 5 mg by mouth in the morning 02/19/18   Meredith Staggers, MD  ferrous sulfate 325 (65 FE) MG tablet Take 325 mg by mouth daily with breakfast.    [provider]  folic acid (FOLVITE) 1 MG tablet Take 1 mg by mouth daily.  12/03/13   [provider]  furosemide (LASIX) 40 MG tablet TAKE 1 TABLET BY  MOUTH EVERY DAY TAKE 1/2 TAB AS NEEDED FOR EXTRA SWELLING 02/11/18   Nahser, Wonda Cheng, MD  lidocaine (LIDODERM) 5 % PLACE 3 PATCHES ONTO THE SKIN EVERY 12 (TWELVE) HOURS. APPLY NECK AND ELBOW Patient taking differently: Place 2 patches onto the skin every 12 (twelve) hours.  11/01/17   Meredith Staggers, MD  LORazepam (ATIVAN) 2 MG tablet Take 2 mg by mouth at bedtime. 02/05/15   [provider]  methotrexate (RHEUMATREX) 2.5 MG tablet Take 15 mg by mouth every Saturday.  03/01/12   [provider]  nitroGLYCERIN (NITROSTAT) 0.4 MG SL tablet Place 1 tablet (0.4 mg total) under the tongue every 5 (five) minutes as needed for chest pain. 02/21/16   Sueanne Margarita, MD  nystatin cream (MYCOSTATIN) Apply 1 application topically daily as needed for dry skin.    [provider]  oxyCODONE-acetaminophen (PERCOCET) 7.5-325 MG tablet Take 1 tablet by mouth every 6 (six) hours as needed for moderate pain. 01/29/18   Meredith Staggers, MD  pantoprazole (PROTONIX) 40 MG tablet Take 40 mg by mouth 2 (two) times daily.     [provider]  predniSONE (DELTASONE) 5 MG tablet Take 5 mg  by mouth daily with breakfast.     [provider]  PREVIDENT 0.2 % SOLN Take 1 each by mouth See admin instructions. Rinses with about 1 capful nightly 09/05/12   [provider]  propranolol (INDERAL) 10 MG tablet Take 1 tablet (10 mg total) by mouth daily as needed (palpitations lasting more than 5 minutes). Patient not taking: Reported on 03/05/2018 01/16/16   Janece Canterbury, MD  simvastatin (ZOCOR) 40 MG tablet TAKE 1 TABLET BY MOUTH AT BEDTIME Patient taking differently: TAKE 40 MG BY MOUTH AT BEDTIME 03/18/17   Turner, Eber Hong, MD  TOVIAZ 8 MG TB24 tablet Take 8 mg by mouth daily. 10/25/15   [provider]  Turmeric 500 MG CAPS Take 500 mg by mouth daily.    [provider]  vitamin B-12 (CYANOCOBALAMIN) 1000 MCG tablet Take 1,000 mcg by mouth daily.    [provider]  vitamin C (ASCORBIC ACID) 500 MG tablet Take 500 mg by mouth 2 (two) times daily.    [provider]    Allergies:   Peanut-containing drug products; Reglan [metoclopramide]; Clindamycin/lincomycin; Codeine; Iohexol; Pineapple; and Shellfish allergy   Social History   Socioeconomic History  . Marital status: Widowed    Spouse name: Not on file  . Number of children: Not on file  . Years of education: Not on file  . Highest education level: Not on file  Occupational History  . Not on file  Social Needs  . Financial resource strain: Not on file  . Food insecurity:    Worry: Not on file    Inability: Not on file  . Transportation needs:    Medical: Not on file    Non-medical: Not on file  Tobacco Use  . Smoking status: Never Smoker  . Smokeless tobacco: Never Used  Substance and Sexual Activity  . Alcohol use: No  . Drug use: No  . Sexual activity: Not on file  Lifestyle  . Physical activity:    Days per week: Not on file    Minutes per session: Not on file  . Stress: Not on file  Relationships  . Social connections:    Talks on phone: Not on file     Gets together: Not on file    Attends  religious service: Not on file    Active member of club or organization: Not on file    Attends meetings of clubs or organizations: Not on file    Relationship status: Not on file  Other Topics Concern  . Not on file  Social History Narrative  . Not on file     Family History:  The patient's family history includes Arrhythmia in her mother; Lung cancer in her mother; Melanoma in her brother.   ROS:   Please see the history of present illness.    ROS All other systems reviewed and are negative.   PHYSICAL EXAM:   VS:  BP 92/62   Pulse (!) 127   Ht 5\' 2"  (1.575 m)   Wt 106 lb 1.9 oz (48.1 kg)   SpO2 99%   BMI 19.41 kg/m    GEN: Thin frail female in no acute distress  HEENT: normal  Neck: no JVD, carotid bruits, or masses Cardiac: Irregularly irregular tachycardic; no murmurs, rubs, or gallops, 1+ bilateral lower extremity edema, worse at the ankle Respiratory:  clear to auscultation bilaterally, normal work of breathing GI: soft, nontender, nondistended, + BS MS: no deformity or atrophy  Skin: warm and dry, no rash Neuro:  Alert and Oriented x 3, Strength and sensation are intact Psych: euthymic mood, full affect  Wt Readings from Last 3 Encounters:  03/10/18 106 lb 1.9 oz (48.1 kg)  03/05/18 107 lb (48.5 kg)  01/29/18 108 lb (49 kg)      Studies/Labs Reviewed:   EKG:  EKG is ordered today.  The ekg ordered today demonstrates atrial fibrillation at rate of 127 bpm  Recent Labs: 07/31/2017: TSH 2.350 03/05/2018: BUN 16; Creatinine, Ser 0.57; Hemoglobin 14.2; Platelets 163; Potassium 3.9; Sodium 136   Lipid Panel    Component Value Date/Time   CHOL 129 02/24/2015 1030   TRIG 81 01/09/2016 0515   HDL 58.80 02/24/2015 1030   CHOLHDL 2 02/24/2015 1030   VLDL 17.6 02/24/2015 1030   LDLCALC 53 02/24/2015 1030    Additional studies/ records that were reviewed today include:   Echocardiogram:  Left Heart Cath and Coronary  Angiography 03/2016  Conclusion    Prox LAD to Mid LAD lesion, 10% stenosed. The lesion was previously treated with a bare metal stent greater than two years ago.  The left ventricular systolic function is normal.   1. No obstructive CAD. Stent in the mid LAD is widely patent. 2. Good overall LV function. There is focal hypokinesis of the basal inferior wall that has been present since cardiac cath in 2005.    ASSESSMENT & PLAN:    1. CAD s/p remote LAD stenting - Last cath 03/2016 showed patent stent with no obstructive CAD.  No angina.  Not on aspirin due to need of anticoagulation.  Continue statin.  2.  Acute on chronic systolic heart failure - Last echo in 2017 showed LVEF of 45%. Felt tachy mediated as cath was reassuring.   -Her lower extremity edema is likely due to elevated rate.  Compliant with low-sodium diet.  Increase Lasix short-term as below.  She does not take any supplemental potassium.  Check bmet today.  3.  Atypical atrial flutter with RVR at variable rate -She is in A. fib/a flutter for past 7 to 10 days.  Rate elevated.  She is symptomatic with this.  Reviewed with DOD Dr. Harrington Challenger.  Patient has a prior history of bradycardia on Toprol.  Initial plan was to reduce  acetabular dose to half a dose of 100 mg daily however can not cut  lowest dose capsule. Will continue at 200mg  qd. Start amiodarone 200 mg daily.  She has not missed any single dose of Eliquis.  Will do cardioversion.  She will follow-up in A. fib clinic afterwards.  May need pacemaker/ablation in the future. Take propranolol 10mg  PRN for tachycardia.   4. Gastric outlet obstruction/ Upcoming surgery - She has 55mm tube at junction. Lost 40lb in last yarr. She is schedule to have surgery on June 11 with bigger tube.  The patient likely need preop clearance.  Get cardioversion this week so she could have  3 weeks of interrupted anticoagulation. prior to her surgery.  Eliquis holding per pharmacy. Follow up in afib  clinic post cardioversion.  If she maintains sinus rhythm, She will be cleared at acceptable risk.     Medication Adjustments/Labs and Tests Ordered: Current medicines are reviewed at length with the patient today.  Concerns regarding medicines are outlined above.  Medication changes, Labs and Tests ordered today are listed in the Patient Instructions below. Patient Instructions  Medication Instructions:  1. Continue Acebutolol 200 mg daily  2. Start Amiodarone 200 mg daily. (new Rx has been sent in) 3. Refill sent in for Propranolol 10 mg 4. Increase Lasix 40 mg twice daily for 3 days. After 3 day resume Lasix 40 mg once a day.  Labwork: Today BMET,CBC   Testing/Procedures: Your physician has recommended that you have a Cardioversion (DCCV). Electrical Cardioversion uses a jolt of electricity to your heart either through paddles or wired patches attached to your chest. This is a controlled, usually prescheduled, procedure. Defibrillation is done under light anesthesia in the hospital, and you usually go home the day of the procedure. This is done to get your heart back into a normal rhythm. You are not awake for the procedure. Please see the instruction sheet given to you today.    Follow-Up: Post procedure follow up with Robbie Lis, PA May 23 at 11:00 a.m.  Any Other Special Instructions Will Be Listed Below (If Applicable).  If you need a refill on your cardiac medications before your next appointment, please call your pharmacy.   Dear Angela [name] You are scheduled for a Cardioversion on MAY 9,2019 with Dr. Stanford Breed @ 2 PM.  Please arrive at the [facility] (Main Entrance A) at [facility]: [place], [place] [address] at 1 PM (1 hour prior to procedure unless lab work is needed; if lab work is needed arrive 1.5 hours ahead)  DIET: Nothing to eat or drink after midnight except a sip of water with medications (see medication instructions below)  Medication  Instructions: Hold LASIX THE MORNING OF PROCEDURE  Continue your anticoagulant: ELIQUIS ; DO NOT MISS ANY DOSES; IF SO PLEASE CALL THE OFFICE You will need to continue your anticoagulant after your procedure until you  are told by your  Provider that it is safe to stop   Labs: If patient is on Coumadin, patient needs pt/INR, CBC, BMET within 3 days (No pt/INR needed for patients taking Xarelto, Eliquis, Pradaxa) For patients receiving anesthesia for TEE and all Cardioversion patients: BMET, CBC within 1 week  Come to: TODAY (Lab option #1) Come to the lab at [facility] between the hours of 8:00 am and 4:30 pm. You do not have to be fasting.   You must have a responsible person to drive you home and stay  in the waiting area during your procedure. Failure to do so could result in cancellation.  Bring your insurance cards.  *Special Note: Every effort is made to have your procedure done on time. Occasionally there are emergencies that occur at the hospital that may cause delays. Please be patient if a delay does occur.       Jarrett Soho, [place]  03/10/2018 3:02 PM    [facility] [address], [place], [place]  [address] Phone: [phone]; Fax: [phone]

## 2018-03-10 NOTE — Telephone Encounter (Signed)
New Message:      Pt is returning a call for [employer]

## 2018-03-11 ENCOUNTER — Telehealth: Payer: Self-pay | Admitting: *Deleted

## 2018-03-11 ENCOUNTER — Other Ambulatory Visit: Payer: Self-pay | Admitting: Cardiology

## 2018-03-11 LAB — CBC
Hematocrit: 40.7 % (ref 34.0–46.6)
Hemoglobin: 14.3 g/dL (ref 11.1–15.9)
MCH: 37 pg — AB (ref 26.6–33.0)
MCHC: 35.1 g/dL (ref 31.5–35.7)
MCV: 105 fL — AB (ref 79–97)
PLATELETS: 190 10*3/uL (ref 150–379)
RBC: 3.87 x10E6/uL (ref 3.77–5.28)
RDW: 14.1 % (ref 12.3–15.4)
WBC: 8.1 10*3/uL (ref 3.4–10.8)

## 2018-03-11 LAB — BASIC METABOLIC PANEL
BUN / CREAT RATIO: 31 — AB (ref 12–28)
BUN: 18 mg/dL (ref 8–27)
CO2: 22 mmol/L (ref 20–29)
CREATININE: 0.59 mg/dL (ref 0.57–1.00)
Calcium: 9.3 mg/dL (ref 8.7–10.3)
Chloride: 97 mmol/L (ref 96–106)
GFR calc Af Amer: 101 mL/min/{1.73_m2} (ref >59)
GFR, EST NON AFRICAN AMERICAN: 87 mL/min/{1.73_m2} (ref >59)
GLUCOSE: 100 mg/dL — AB (ref 65–99)
POTASSIUM: 4.2 mmol/L (ref 3.5–5.2)
Sodium: 135 mmol/L (ref 134–144)

## 2018-03-11 MED ORDER — ROSUVASTATIN CALCIUM 10 MG PO TABS: 10.0000 mg | ORAL_TABLET | Freq: Every day | ORAL | 3 refills | Status: AC

## 2018-03-11 NOTE — Telephone Encounter (Signed)
Pt called and said she picked up Amiodarone and will start today. Pt states Pharmacist said she should not take Zocor with Amiodarone due to interactions. I advised pt that I will d/w PA Vin Bhagat as to recommendation per pharmacist at [facility]. Per PA ok to stop Zocor and change to another medication for cholesterol. PA asked for me to please d/w Pharm D in our office for recommendations as to what statin ok to take with Amiodarone. Per Eino Farber, Pharm-D advised to change over to Crestor 10 mg daily. I then called the pt back and have gone over new recommendations to start Crestor 10 mg daily, Rx has been sent in. Pt thanked me for the help today.

## 2018-03-13 ENCOUNTER — Ambulatory Visit (HOSPITAL_COMMUNITY): Payer: Medicare Other | Admitting: Anesthesiology

## 2018-03-13 ENCOUNTER — Encounter (HOSPITAL_COMMUNITY)
Admission: RE | Disposition: A | Discharge status: Home / Self Care | Payer: Self-pay | Source: Ambulatory Visit | Attending: Cardiology

## 2018-03-13 ENCOUNTER — Encounter (HOSPITAL_COMMUNITY): Payer: Self-pay | Admitting: *Deleted

## 2018-03-13 ENCOUNTER — Ambulatory Visit (HOSPITAL_COMMUNITY)
Admission: RE | Admit: 2018-03-13 | Discharge: 2018-03-13 | Disposition: A | Discharge status: Home / Self Care | Payer: Medicare Other | Source: Ambulatory Visit | Attending: Cardiology | Admitting: Cardiology

## 2018-03-13 DIAGNOSIS — Z8249 Family history of ischemic heart disease and other diseases of the circulatory system: Secondary | ICD-10-CM | POA: Diagnosis not present

## 2018-03-13 DIAGNOSIS — Z87828 Personal history of other (healed) physical injury and trauma: Secondary | ICD-10-CM | POA: Diagnosis not present

## 2018-03-13 DIAGNOSIS — Z7902 Long term (current) use of antithrombotics/antiplatelets: Secondary | ICD-10-CM | POA: Insufficient documentation

## 2018-03-13 DIAGNOSIS — I481 Persistent atrial fibrillation: Secondary | ICD-10-CM | POA: Insufficient documentation

## 2018-03-13 DIAGNOSIS — I2510 Atherosclerotic heart disease of native coronary artery without angina pectoris: Secondary | ICD-10-CM | POA: Insufficient documentation

## 2018-03-13 DIAGNOSIS — Z981 Arthrodesis status: Secondary | ICD-10-CM | POA: Diagnosis not present

## 2018-03-13 DIAGNOSIS — I5023 Acute on chronic systolic (congestive) heart failure: Secondary | ICD-10-CM | POA: Insufficient documentation

## 2018-03-13 DIAGNOSIS — K219 Gastro-esophageal reflux disease without esophagitis: Secondary | ICD-10-CM | POA: Insufficient documentation

## 2018-03-13 DIAGNOSIS — Z881 Allergy status to other antibiotic agents status: Secondary | ICD-10-CM | POA: Diagnosis not present

## 2018-03-13 DIAGNOSIS — Z9889 Other specified postprocedural states: Secondary | ICD-10-CM | POA: Insufficient documentation

## 2018-03-13 DIAGNOSIS — Z79899 Other long term (current) drug therapy: Secondary | ICD-10-CM | POA: Diagnosis not present

## 2018-03-13 DIAGNOSIS — Z955 Presence of coronary angioplasty implant and graft: Secondary | ICD-10-CM | POA: Insufficient documentation

## 2018-03-13 DIAGNOSIS — Z91018 Allergy to other foods: Secondary | ICD-10-CM | POA: Insufficient documentation

## 2018-03-13 DIAGNOSIS — Z9842 Cataract extraction status, left eye: Secondary | ICD-10-CM | POA: Diagnosis not present

## 2018-03-13 DIAGNOSIS — I110 Hypertensive heart disease with heart failure: Secondary | ICD-10-CM | POA: Insufficient documentation

## 2018-03-13 DIAGNOSIS — E785 Hyperlipidemia, unspecified: Secondary | ICD-10-CM | POA: Diagnosis not present

## 2018-03-13 DIAGNOSIS — Z9049 Acquired absence of other specified parts of digestive tract: Secondary | ICD-10-CM | POA: Insufficient documentation

## 2018-03-13 DIAGNOSIS — I493 Ventricular premature depolarization: Secondary | ICD-10-CM | POA: Diagnosis not present

## 2018-03-13 DIAGNOSIS — Z91010 Allergy to peanuts: Secondary | ICD-10-CM | POA: Insufficient documentation

## 2018-03-13 DIAGNOSIS — E669 Obesity, unspecified: Secondary | ICD-10-CM | POA: Diagnosis not present

## 2018-03-13 DIAGNOSIS — Z91013 Allergy to seafood: Secondary | ICD-10-CM | POA: Insufficient documentation

## 2018-03-13 DIAGNOSIS — M069 Rheumatoid arthritis, unspecified: Secondary | ICD-10-CM | POA: Insufficient documentation

## 2018-03-13 DIAGNOSIS — Z888 Allergy status to other drugs, medicaments and biological substances status: Secondary | ICD-10-CM | POA: Insufficient documentation

## 2018-03-13 DIAGNOSIS — Z885 Allergy status to narcotic agent status: Secondary | ICD-10-CM | POA: Diagnosis not present

## 2018-03-13 DIAGNOSIS — M810 Age-related osteoporosis without current pathological fracture: Secondary | ICD-10-CM | POA: Insufficient documentation

## 2018-03-13 DIAGNOSIS — Z9841 Cataract extraction status, right eye: Secondary | ICD-10-CM | POA: Insufficient documentation

## 2018-03-13 DIAGNOSIS — I471 Supraventricular tachycardia: Secondary | ICD-10-CM | POA: Diagnosis not present

## 2018-03-13 DIAGNOSIS — F329 Major depressive disorder, single episode, unspecified: Secondary | ICD-10-CM | POA: Diagnosis not present

## 2018-03-13 DIAGNOSIS — I480 Paroxysmal atrial fibrillation: Secondary | ICD-10-CM | POA: Insufficient documentation

## 2018-03-13 DIAGNOSIS — I4892 Unspecified atrial flutter: Secondary | ICD-10-CM | POA: Diagnosis not present

## 2018-03-13 HISTORY — PX: CARDIOVERSION: SHX1299

## 2018-03-13 SURGERY — CARDIOVERSION
Anesthesia: General

## 2018-03-13 MED ORDER — LIDOCAINE HCL (CARDIAC) PF 100 MG/5ML IV SOSY
PREFILLED_SYRINGE | INTRAVENOUS | Status: DC | PRN
  Administered 2018-03-13: 60 mg via INTRATRACHEAL

## 2018-03-13 MED ORDER — SODIUM CHLORIDE 0.9 % IV SOLN
INTRAVENOUS | Status: DC | PRN
  Administered 2018-03-13: 13:00:00 via INTRAVENOUS

## 2018-03-13 MED ORDER — PROPOFOL 10 MG/ML IV BOLUS
INTRAVENOUS | Status: DC | PRN
  Administered 2018-03-13: 75 mg via INTRAVENOUS

## 2018-03-13 MED ORDER — PHENYLEPHRINE HCL 10 MG/ML IJ SOLN
INTRAMUSCULAR | Status: DC | PRN
  Administered 2018-03-13 (×2): 40 ug via INTRAVENOUS

## 2018-03-13 NOTE — Interval H&P Note (Signed)
History and Physical Interval Note:  03/13/2018 1:47 PM  Angela [name]  has presented today for surgery, with the diagnosis of AFLUTTER  The various methods of treatment have been discussed with the patient and family. After consideration of risks, benefits and other options for treatment, the patient has consented to  Procedure(s): CARDIOVERSION (N/A) as a surgical intervention .  The patient's history has been reviewed, patient examined, no change in status, stable for surgery.  I have reviewed the patient's chart and labs.  Questions were answered to the patient's satisfaction.     Kirk Ruths

## 2018-03-13 NOTE — Anesthesia Postprocedure Evaluation (Signed)
Anesthesia Post Note  Patient: [name]  Procedure(s) Performed: CARDIOVERSION (N/A )     Patient location during evaluation: PACU Anesthesia Type: General Level of consciousness: awake and alert Pain management: pain level controlled Vital Signs Assessment: post-procedure vital signs reviewed and stable Respiratory status: spontaneous breathing, nonlabored ventilation, respiratory function stable and patient connected to nasal cannula oxygen Cardiovascular status: blood pressure returned to baseline and stable Postop Assessment: no apparent nausea or vomiting Anesthetic complications: no    Last Vitals:  Vitals:   03/13/18 1420 03/13/18 1423  BP: (!) 94/52 93/61  Pulse: 64 63  Resp: 12 18  Temp:    SpO2: 98% 97%    Last Pain:  Vitals:   03/13/18 1423  TempSrc:   PainSc: 0-No pain                 Jonas Goh P Brunella Wileman

## 2018-03-13 NOTE — H&P (Signed)
Office Visit   Go to Cards  03/10/2018  [facility] [address]              [place], [place], [place]     Cardiology         PAF (paroxysmal atrial fibrillation) [facility]) +4 more     Dx         Referred by [name], MD     Reason for Visit           Additional Documentation     Vitals:       BP 92/62        Pulse 127         Ht 5\' 2"  (1.575 m)        Wt 106 lb 1.9 oz (48.1 kg)        SpO2 99%        BMI 19.41 kg/m        BSA 1.45 m      Flowsheets:        MEWS Score,        Anthropometrics       Encounter Info:        Billing Info,        History,        Allergies,        Detailed Report             All Notes         Procedures by Leanor Kail, PA at 03/10/2018 11:00 AM     Author: Leanor Kail, PA Author Type: Physician Assistant Filed: 03/10/2018  4:29 PM  Note Status: Signed Cosign: Cosign Not Required Encounter Date: 03/10/2018  Editor: Sallee Provencal L     Procedure Orders:  1. EKG 12-Lead [[id number]] ordered by Leanor Kail, PA at 03/10/18 1151                   Scan on 03/10/2018 4:29 PM by Sallee Provencal L: Ekg - [facility]        Progress Notes by Leanor Kail, PA at 03/10/2018 11:00 AM     Author: Leanor Kail, PA Author Type: Physician Assistant Filed: 03/10/2018  3:05 PM  Note Status: Signed Cosign: Cosign Not Required Encounter Date: 03/10/2018  Editor: Leanor Kail, PA (Physician Assistant)      Expand All Collapse All         untitled image       Your cardiology Office Note        Date:  03/10/2018      ID:  Angela [name], DOB Jan 13, 1936, MRN [id number]     PCP:  [name], MD            Cardiologist:  Dr. Radford Pax >>>>Dr. Nahser     Chief Complaint: ER follow up for afib/aflutter       History of Present Illness:        Angela [name] is a 82 y.o. female CAD s/p prior PCI to the LAD, HL, HTN, PVCs, rheumatoid arthritis, peptic ulcer disease, aortic stenosis and paroxysmal atrial fibrillation/aflutter and chronic cardiomyopathy presents for afib.     She was seen for bradycardia and Holter in 8/15 showed NSR with PACs and PVCs and ATach.  Toprol was changed to Acebutolol.  Bradycardia resolved.  Last seen by Dr. Fransico Him in 4/16.  Prior echo suggested mod AS.  Repeat limited echo demonstrated just mild AS.      Admitted  3/17 with gastric outlet obstruction noted on EGD.  She had an NG tube placed and underwent EGD demonstrating a polypoid lesion in the pyloric channel with surrounding ulcer and edema.  Bx was neg for malignancy.  Hospital course was c/b AFlutter with RVR and she was seen by Cardiology.  Echo demonstrated mod AS, mod TR, EF 45%, LAE.  Acebutolol was increased to 400 bid.  She converted to NSR.   She also had episodes of PSVT. She was given prn Propranolol to use.  CHADS2-VASc=6 (CHF, vascular dz, female, age, HTN).  She was placed on Apixaban.       Given low LVEF of 45% (reduced from prior), he underwent stress test which was abnormal. Follow up [facility] 03/2016 showed non obstructive CAD and patent LAD stent.      Noted Elevated HR of 120s during office visit at Rheumatologist office. Sent to ER where HR sustained in 80s and send home with outpatient cardiology follow up.      She is in atrial fibrillation for past 7 to 10 days.  Heart rate in 100-120s.  She is not taking digoxin for past 6 to 8 months.  She then out of PRN propranolol.  She has tachypnea with atrial fibrillation.  Has noted lower extremity edema.  Denies chest pain or syncope.  Denies orthopnea, PND, syncope, melena or blood in her stool or urine.  Patient has a history of gastric outlet obstruction.  She has lost 40 pounds in past 1 year.  She has upcoming surgery to opening a port with larger sites.              Past Medical History:    Diagnosis   Date    .   A-fib ([place])        .   Aortic stenosis            mild by echo 03/2012    .   Asthma        .   Atrial fibrillation ([place])        .   CAD (coronary artery disease)            s/p PCI with BMS to mid LAD--2/08 not on aspirin due to frequent ulcers.  Repeat cath for CP 09/2012 with patent LAD stent and otherwise normal coronary arteries    .   Cervical stenosis of spine        .   Depression        .   Dyslipidemia        .   Elbow fracture, right            history of- never any surgery    .   GERD (gastroesophageal reflux disease)        .   Headache        .   Hypertension        .   Impaired physical mobility            due to rheumatoid arthritis "ambulates with walker" limited free standing.    .   Intestinal polyp        .   Macular degeneration        .   Obesity        .   Osteoporosis            alendronate since 2012    .   PUD (peptic ulcer disease)  can not take aspirin    .   PVC's (premature ventricular contractions)        .   RA (rheumatoid arthritis) ([place])        .   Rheumatoid arthritis(714.0)            Dr. Lenna Gilford follows    .   Spondylolisthesis of lumbar region                    Past Surgical History:    Procedure   Laterality   Date    .   ANKLE FUSION   Bilateral            x2 both    .   BALLOON DILATION   N/A   01/10/2018        Procedure: BALLOON DILATION;  Surgeon: [name], MD;  Location: [place];  Service: Gastroenterology;  Laterality: N/A;    .   BALLOON DILATION   N/A   01/24/2018        Procedure: Larrie Kass DILATION;  Surgeon: [name], MD;  Location: [place];  Service: Gastroenterology;  Laterality: N/A;  W/ Fluoroscopy. Please use colonoscope.    Marland Kitchen   CARDIAC  CATHETERIZATION       09/2012        patent LAD stent and otherwise normal coronary arteries    .   CARDIAC CATHETERIZATION   N/A   03/07/2016        Procedure: Left Heart Cath and Coronary Angiography;  Surgeon: Peter M Martinique, MD;  Location: [place] [facility];  Service: Cardiovascular;  Laterality: N/A;    .   CATARACT EXTRACTION, BILATERAL   Bilateral        .   CERVICAL FUSION                limited  ROM    .   CHOLECYSTECTOMY            .   ESOPHAGOGASTRODUODENOSCOPY   N/A   01/07/2016        Procedure: ESOPHAGOGASTRODUODENOSCOPY (EGD);  Surgeon: Wilford Corner, MD;  Location: [facility];  Service: Endoscopy;  Laterality: N/A;    .   ESOPHAGOGASTRODUODENOSCOPY   N/A   01/10/2018        Procedure: ESOPHAGOGASTRODUODENOSCOPY (EGD);  Surgeon: [name], MD;  Location: [place];  Service: Gastroenterology;  Laterality: N/A;    .   ESOPHAGOGASTRODUODENOSCOPY   N/A   01/24/2018        Procedure: ESOPHAGOGASTRODUODENOSCOPY (EGD);  Surgeon: [name], MD;  Location: [place];  Service: Gastroenterology;  Laterality: N/A;    .   ESOPHAGOGASTRODUODENOSCOPY (EGD) WITH PROPOFOL   N/A   11/01/2015        Procedure: ESOPHAGOGASTRODUODENOSCOPY (EGD) WITH PROPOFOL w/ Dialtation;  Surgeon: Garlan Fair, MD;  Location: [facility];  Service: Endoscopy;  Laterality: N/A;    .   FRACTURE SURGERY       09/2011        left ankle screw and pin removed    .   HAND SURGERY   Bilateral            x4 digits 2-5 both hands    .   JOINT REPLACEMENT   Bilateral            BTKA    .   TONSILLECTOMY  Current Medications:          Prior to Admission medications     Medication   Sig   Start Date   End Date   Taking?   Authorizing Provider    acebutolol (SECTRAL) 200 MG capsule   Take 1 capsule (200 mg total) by mouth every  morning.   07/31/17           Daune Perch, NP    apixaban (ELIQUIS) 5 MG TABS tablet   Take 1 tablet (5 mg total) by mouth 2 (two) times daily.   07/31/17           Daune Perch, NP    butalbital-acetaminophen-caffeine (FIORICET, ESGIC) [phone] MG per tablet   Take 1 tablet by mouth every 6 (six) hours as needed for headache or migraine.    06/23/12           [provider]    Calcium Carbonate-Vitamin D (CALCIUM 500+D PO)   Take 1 tablet by mouth 2 (two) times daily.                [provider]    cephALEXin (KEFLEX) 500 MG capsule   Take 1 capsule (500 mg total) by mouth 2 (two) times daily for 7 days.   03/05/18   03/12/18       Gareth Morgan, MD    DENTAGEL 1.1 % GEL dental gel   Place 1 application onto teeth at bedtime.    12/12/15           [provider]    diclofenac sodium (VOLTAREN) 1 % GEL   Apply 2 grams topically at bedtime to shoulders and elbows   01/29/18           Meredith Staggers, MD    digoxin (LANOXIN) 0.125 MG tablet   Take 125 mcg by mouth daily.   01/03/18           [provider]    Docusate Calcium (STOOL SOFTENER PO)   Take 1 tablet by mouth at bedtime.               [provider]    escitalopram (LEXAPRO) 5 MG tablet   Take 5 mg by mouth in the morning   02/19/18           Meredith Staggers, MD    ferrous sulfate 325 (65 FE) MG tablet   Take 325 mg by mouth daily with breakfast.               [provider]    folic acid (FOLVITE) 1 MG tablet   Take 1 mg by mouth daily.    12/03/13           [provider]    furosemide (LASIX) 40 MG tablet   TAKE 1 TABLET BY MOUTH EVERY DAY TAKE 1/2 TAB AS NEEDED FOR EXTRA SWELLING   02/11/18           Nahser, Wonda Cheng, MD    lidocaine (LIDODERM) 5 %   PLACE 3 PATCHES ONTO THE SKIN EVERY 12 (TWELVE) HOURS. APPLY NECK  AND ELBOW  Patient taking differently: Place 2 patches onto the skin every 12 (twelve) hours.    11/01/17           Meredith Staggers, MD    LORazepam (ATIVAN) 2 MG tablet   Take 2 mg by mouth at bedtime.   02/05/15  [provider]    methotrexate (RHEUMATREX) 2.5 MG tablet   Take 15 mg by mouth every Saturday.    03/01/12           [provider]    nitroGLYCERIN (NITROSTAT) 0.4 MG SL tablet   Place 1 tablet (0.4 mg total) under the tongue every 5 (five) minutes as needed for chest pain.   02/21/16           Sueanne Margarita, MD    nystatin cream (MYCOSTATIN)   Apply 1 application topically daily as needed for dry skin.               [provider]    oxyCODONE-acetaminophen (PERCOCET) 7.5-325 MG tablet   Take 1 tablet by mouth every 6 (six) hours as needed for moderate pain.   01/29/18           Meredith Staggers, MD    pantoprazole (PROTONIX) 40 MG tablet   Take 40 mg by mouth 2 (two) times daily.                [provider]    predniSONE (DELTASONE) 5 MG tablet   Take 5 mg by mouth daily with breakfast.                [provider]    PREVIDENT 0.2 % SOLN   Take 1 each by mouth See admin instructions. Rinses with about 1 capful nightly   09/05/12           [provider]    propranolol (INDERAL) 10 MG tablet   Take 1 tablet (10 mg total) by mouth daily as needed (palpitations lasting more than 5 minutes).  Patient not taking: Reported on 03/05/2018   01/16/16           Janece Canterbury, MD    simvastatin (ZOCOR) 40 MG tablet   TAKE 1 TABLET BY MOUTH AT BEDTIME  Patient taking differently: TAKE 40 MG BY MOUTH AT BEDTIME   03/18/17           Turner, Eber Hong, MD    TOVIAZ 8 MG TB24 tablet   Take 8 mg by mouth daily.   10/25/15           [provider]    Turmeric 500 MG CAPS   Take 500  mg by mouth daily.               [provider]    vitamin B-12 (CYANOCOBALAMIN) 1000 MCG tablet   Take 1,000 mcg by mouth daily.               [provider]    vitamin C (ASCORBIC ACID) 500 MG tablet   Take 500 mg by mouth 2 (two) times daily.               [provider]          Allergies:   Peanut-containing drug products; Reglan [metoclopramide]; Clindamycin/lincomycin; Codeine; Iohexol; Pineapple; and Shellfish allergy       Social History             Socioeconomic History    .   Marital status:   Widowed            Spouse name:   Not on file    .   Number of children:   Not on file    .   Years of  education:   Not on file    .   Highest education level:   Not on file    Occupational History    .   Not on file    Social Needs    .   Financial resource strain:   Not on file    .   Food insecurity:            Worry:   Not on file            Inability:   Not on file    .   Transportation needs:            Medical:   Not on file            Non-medical:   Not on file    Tobacco Use    .   Smoking status:   Never Smoker    .   Smokeless tobacco:   Never Used    Substance and Sexual Activity    .   Alcohol use:   No    .   Drug use:   No    .   Sexual activity:   Not on file    Lifestyle    .   Physical activity:            Days per week:   Not on file            Minutes per session:   Not on file    .   Stress:   Not on file    Relationships    .   Social connections:            Talks on phone:   Not on file            Gets together:   Not on file            Attends religious service:   Not on file            Active member of club or organization:   Not on file            Attends meetings of clubs or  organizations:   Not on file            Relationship status:   Not on file    Other Topics   Concern    .   Not on file    Social History Narrative    .   Not on file          Family History:  The patient's family history includes Arrhythmia in her mother; Lung cancer in her mother; Melanoma in her brother.      ROS:    Please see the history of present illness.     ROS All other systems reviewed and are negative.         PHYSICAL EXAM:     VS:  BP 92/62   Pulse (!) 127   Ht 5\' 2"  (1.575 m)   Wt 106 lb 1.9 oz (48.1 kg)   SpO2 99%   BMI 19.41 kg/m     GEN: Thin frail female in no acute distress   HEENT: normal   Neck: no JVD, carotid bruits, or masses  Cardiac: Irregularly irregular tachycardic; no murmurs, rubs, or gallops, 1+ bilateral lower extremity edema, worse at the ankle  Respiratory:  clear to auscultation bilaterally, normal work of breathing  GI: soft, nontender, nondistended, +  BS  MS: no deformity or atrophy   Skin: warm and dry, no rash  Neuro:  Alert and Oriented x 3, Strength and sensation are intact  Psych: euthymic mood, full affect         Wt Readings from Last 3 Encounters:    03/10/18   106 lb 1.9 oz (48.1 kg)    03/05/18   107 lb (48.5 kg)    01/29/18   108 lb (49 kg)              Studies/Labs Reviewed:       EKG:  EKG is ordered today.  The ekg ordered today demonstrates atrial fibrillation at rate of 127 bpm     Recent Labs:  07/31/2017: TSH 2.350  03/05/2018: BUN 16; Creatinine, Ser 0.57; Hemoglobin 14.2; Platelets 163; Potassium 3.9; Sodium 136      Lipid Panel  Labs (Brief)                                                                                                              Additional studies/ records that were reviewed today include:      Echocardiogram:       Left Heart Cath  and Coronary Angiography 03/2016    Conclusion        .Prox LAD to Mid LAD lesion, 10% stenosed. The lesion was previously treated with a bare metal stent greater than two years ago.   .The left ventricular systolic function is normal.      1. No obstructive CAD. Stent in the mid LAD is widely patent.  2. Good overall LV function. There is focal hypokinesis of the basal inferior wall that has been present since cardiac cath in 2005.           ASSESSMENT & PLAN:          1.CAD s/p remote LAD stenting   - Last cath 03/2016 showed patent stent with no obstructive CAD.  No angina.  Not on aspirin due to need of anticoagulation.  Continue statin.     2.  Acute on chronic systolic heart failure  - Last echo in 2017 showed LVEF of 45%. Felt tachy mediated as cath was reassuring.    -Her lower extremity edema is likely due to elevated rate.  Compliant with low-sodium diet.  Increase Lasix short-term as below.  She does not take any supplemental potassium.  Check bmet today.     3.  Atypical atrial flutter with RVR at variable rate  -She is in A. fib/a flutter for past 7 to 10 days.  Rate elevated.  She is symptomatic with this.  Reviewed with DOD Dr. Harrington Challenger.  Patient has a prior history of bradycardia on Toprol.  Initial plan was to reduce acetabular dose to half a dose of 100 mg daily however can not cut  lowest dose capsule. Will continue at 200mg  qd. Start amiodarone 200 mg daily.  She has not missed any single dose of Eliquis.  Will do cardioversion.  She will follow-up  in A. fib clinic afterwards.  May need pacemaker/ablation in the future. Take propranolol 10mg  PRN for tachycardia.      4. Gastric outlet obstruction/ Upcoming surgery  - She has 35mm tube at junction. Lost 40lb in last yarr. She is schedule to have surgery on June 11 with bigger tube.  The patient likely need preop clearance.  Get cardioversion this week so she could have  3 weeks of interrupted  anticoagulation. prior to her surgery.  Eliquis holding per pharmacy. Follow up in afib clinic post cardioversion.  If she maintains sinus rhythm, She will be cleared at acceptable risk.          Medication Adjustments/Labs and Tests Ordered:  Current medicines are reviewed at length with the patient today.  Concerns regarding medicines are outlined above.  Medication changes, Labs and Tests ordered today are listed in the Patient Instructions below.   Patient Instructions    Medication Instructions:   1. Continue Acebutolol 200 mg daily   2. Start Amiodarone 200 mg daily. (new Rx has been sent in)  3. Refill sent in for Propranolol 10 mg  4. Increase Lasix 40 mg twice daily for 3 days. After 3 day resume Lasix 40 mg once a day.     Labwork:  Today BMET,CBC      Testing/Procedures:  Your physician has recommended that you have a Cardioversion (DCCV). Electrical Cardioversion uses a jolt of electricity to your heart either through paddles or wired patches attached to your chest. This is a controlled, usually prescheduled, procedure. Defibrillation is done under light anesthesia in the hospital, and you usually go home the day of the procedure. This is done to get your heart back into a normal rhythm. You are not awake for the procedure. Please see the instruction sheet given to you today.           Follow-Up:  Post procedure follow up with Robbie Lis, PA May 23 at 11:00 a.m.     Any Other Special Instructions Will Be Listed Below (If Applicable).     If you need a refill on your cardiac medications before your next appointment, please call your pharmacy.        Dear [name]  You are scheduled for a Cardioversion on MAY 9,2019 with Dr. Stanford Breed @ 2 PM.  Please arrive at the [facility] (Main Entrance A) at [facility]: [place], [place] [address] at 1 PM (1 hour prior to procedure unless lab work is needed; if lab work is needed  arrive 1.5 hours ahead)     DIET: Nothing to eat or drink after midnight except a sip of water with medications (see medication instructions below)     Medication Instructions:  Hold LASIX THE MORNING OF PROCEDURE     Continue your anticoagulant: ELIQUIS ; DO NOT MISS ANY DOSES; IF SO PLEASE CALL THE OFFICE  You will need to continue your anticoagulant after your procedure until you            are told by your   Provider that it is safe to stop        Labs: If patient is on Coumadin, patient needs pt/INR, CBC, BMET within 3 days (No pt/INR needed for patients taking Xarelto, Eliquis, Pradaxa)  For patients receiving anesthesia for TEE and all Cardioversion patients: BMET, CBC within 1 week     Come to: TODAY  (Lab option #1) Come to the lab at [address] between the hours of 8:00 am and 4:30 pm. You do not have to be fasting.        You must have a responsible person to drive you home and stay in the waiting area during your procedure. Failure to do so could result in cancellation.     Bring your insurance cards.     *Special Note: Every effort is made to have your procedure done on time. Occasionally there are emergencies that occur at the hospital that may cause delays. Please be patient if a delay does occur.               Jarrett Soho, [place]   03/10/2018 3:02 PM     [facility]  [place], [place], [place]  [address]  Phone: [phone]; Fax: [phone]     For DCCV; no changes; compliant with apixaban.  Kirk Ruths, MD

## 2018-03-13 NOTE — Procedures (Signed)
Electrical Cardioversion Procedure Note Angela [name] [id number] July 13, 1936  Procedure: Electrical Cardioversion Indications:  Atrial Fibrillation  Procedure Details Consent: Risks of procedure as well as the alternatives and risks of each were explained to the (patient/caregiver).  Consent for procedure obtained. Time Out: Verified patient identification, verified procedure, site/side was marked, verified correct patient position, special equipment/implants available, medications/allergies/relevent history reviewed, required imaging and test results available.  Performed  Patient placed on cardiac monitor, pulse oximetry, supplemental oxygen as necessary.  Sedation given: Pt sedated by anesthesia with lidocaine 60 mg and diprovan 75 mg IV. Pacer pads placed anterior and posterior chest.  Cardioverted 1 time(s).  Cardioverted at 120J.  Evaluation Findings: Post procedure EKG shows: NSR Complications: None Patient did tolerate procedure well.   Angela [name] 03/13/2018, 1:48 PM

## 2018-03-13 NOTE — Anesthesia Preprocedure Evaluation (Addendum)
Anesthesia Evaluation  Patient identified by MRN, date of birth, ID band Patient awake    Reviewed: Allergy & Precautions, NPO status , Patient's Chart, lab work & pertinent test results  Airway Mallampati: III  TM Distance: >3 FB Neck ROM: Limited    Dental no notable dental hx.    Pulmonary asthma ,    Pulmonary exam normal breath sounds clear to auscultation       Cardiovascular hypertension, + CAD and + Cardiac Stents  Normal cardiovascular exam+ dysrhythmias Atrial Fibrillation + Valvular Problems/Murmurs AS  Rhythm:Regular Rate:Normal  ECG: A-flutter, rate 127   Neuro/Psych  Headaches, PSYCHIATRIC DISORDERS Depression    GI/Hepatic Neg liver ROS, PUD, GERD  ,Intestinal polyp   Endo/Other  negative endocrine ROS  Renal/GU negative Renal ROS     Musculoskeletal  (+) Arthritis , Rheumatoid disorders,  Cervical stenosis of spine   Abdominal   Peds  Hematology HLD   Anesthesia Other Findings AFLUTTER with RVR  Reproductive/Obstetrics                            Anesthesia Physical Anesthesia Plan  ASA: IV  Anesthesia Plan: General   Post-op Pain Management:    Induction: Intravenous  PONV Risk Score and Plan: 3 and Propofol infusion and Treatment may vary due to age or medical condition  Airway Management Planned: Mask  Additional Equipment:   Intra-op Plan:   Post-operative Plan:   Informed Consent: I have reviewed the patients History and Physical, chart, labs and discussed the procedure including the risks, benefits and alternatives for the proposed anesthesia with the patient or authorized representative who has indicated his/her understanding and acceptance.   Dental advisory given  Plan Discussed with: CRNA  Anesthesia Plan Comments:         Anesthesia Quick Evaluation

## 2018-03-13 NOTE — Transfer of Care (Signed)
Immediate Anesthesia Transfer of Care Note  Patient: Angela [name]  Procedure(s) Performed: CARDIOVERSION (N/A )  Patient Location: Endoscopy Unit  Anesthesia Type:MAC  Level of Consciousness: awake, alert  and oriented  Airway & Oxygen Therapy: Patient Spontanous Breathing and Patient connected to nasal cannula oxygen  Post-op Assessment: Report given to RN, Post -op Vital signs reviewed and stable and Patient moving all extremities X 4  Post vital signs: Reviewed and stable  Last Vitals:  Vitals Value Taken Time  BP    Temp    Pulse    Resp    SpO2      Last Pain:  Vitals:   03/13/18 1325  TempSrc:   PainSc: 0-No pain         Complications: No apparent anesthesia complications

## 2018-03-14 ENCOUNTER — Encounter (HOSPITAL_COMMUNITY): Payer: Self-pay | Admitting: Cardiology

## 2018-03-17 ENCOUNTER — Telehealth: Payer: Self-pay | Admitting: Physician Assistant

## 2018-03-17 NOTE — Telephone Encounter (Signed)
New Message   Patient c/o Palpitations:  High priority if patient c/o lightheadedness, shortness of breath, or chest pain  1) How long have you had palpitations/irregular HR/ Afib? Are you having the symptoms now? Started last night, yes  2) Are you currently experiencing lightheadedness, SOB or CP? no  3) Do you have a history of afib (atrial fibrillation) or irregular heart rhythm? yes  4) Have you checked your BP or HR? (document readings if available): no  5) Are you experiencing any other symptoms? Took a  propranolol (INDERAL) 10 MG tablet, and wants to know how often she can take it, instructions says as needed

## 2018-03-17 NOTE — Telephone Encounter (Signed)
"  Yesterday my heart went out of rhythm and I took a Propranolol".  This morning I took another one".  That seemed to help and pt reports being back "in rhythm". She would like to know how often she can take if needed. Per Dr. Acie Fredrickson - Advised she may take 4 times daily.  Explained that she doesn't have to wait 6 hours b/t each dose.  Explained that if she takes one and an hour/two later she is still out of rhythm she may take another. Advised to call the office if she is having to use frequently. Patient verbalized understanding and agreeable to plan.

## 2018-03-17 NOTE — Telephone Encounter (Signed)
Attempted to reach patient several times.  Number keeps ringing and then disconnects.

## 2018-03-27 ENCOUNTER — Encounter: Payer: Self-pay | Admitting: Physician Assistant

## 2018-03-27 ENCOUNTER — Ambulatory Visit (INDEPENDENT_AMBULATORY_CARE_PROVIDER_SITE_OTHER): Payer: Medicare Other | Admitting: Physician Assistant

## 2018-03-27 VITALS — BP 98/60 | HR 98 | Ht 62.0 in | Wt 108.8 lb

## 2018-03-27 DIAGNOSIS — I428 Other cardiomyopathies: Secondary | ICD-10-CM

## 2018-03-27 DIAGNOSIS — I480 Paroxysmal atrial fibrillation: Secondary | ICD-10-CM | POA: Diagnosis not present

## 2018-03-27 DIAGNOSIS — I493 Ventricular premature depolarization: Secondary | ICD-10-CM | POA: Diagnosis not present

## 2018-03-27 DIAGNOSIS — I2510 Atherosclerotic heart disease of native coronary artery without angina pectoris: Secondary | ICD-10-CM

## 2018-03-27 DIAGNOSIS — I4892 Unspecified atrial flutter: Secondary | ICD-10-CM | POA: Diagnosis not present

## 2018-03-27 MED ORDER — AMIODARONE HCL 100 MG PO TABS: 100.0000 mg | ORAL_TABLET | Freq: Every day | ORAL | 3 refills | Status: DC

## 2018-03-27 NOTE — Progress Notes (Signed)
Cardiology Office Note    Date:  03/27/2018   ID:  Angela [name], DOB 1935-12-01, MRN [id number]  PCP:  [name], MD  Cardiologist:  Dr. Radford Pax >>>>Dr. Nahser  Chief Complaint: Afib follow up  History of Present Illness:   Angela [name] is a 82 y.o. female with hx of CAD s/p prior PCI to the LAD, HL, HTN, PVCs, rheumatoid arthritis, peptic ulcer disease, aortic stenosis and paroxysmal atrial fibrillation/aflutter and chronic cardiomyopathy presents for follow up.   She was seen for bradycardia and Holter in 8/15 showed NSR with PACs and PVCs and ATach. Toprol was changed to Acebutolol. and bradycardia resolved. Last seen by Dr. Fransico Him in 4/16. Prior echo suggested mod AS. Repeat limited echo demonstrated just mild AS.   Admitted 3/17 with gastric outlet obstruction noted on EGD. She had an NG tube placed and underwent EGD demonstrating a polypoid lesion in the pyloric channel with surrounding ulcer and edema. Bx was neg for malignancy. Hospital course was c/b AFlutter with RVR and she was seen by Cardiology. Echo demonstrated mod AS, mod TR, EF 45%, LAE. Acebutolol was increased to 400 bid. She converted to NSR. She also had episodes of PSVT. She was given prn Propranolol to use. CHADS2-VASc=6 (CHF, vascular dz, female, age, HTN). She was placed on Apixaban.   Given low LVEF of 45% (reduced from prior), he underwent stress test which was abnormal. Follow up [facility] 03/2016 showed non obstructive CAD and patent LAD stent.  Noted tachycardia with AFib 02/2018. She was symptomatic with this. Dr. Harrington Challenger recommended addition of amiodarone and cardioversion. Unable to cut back acetabula due to lowest dose of capsule. Increased lasix for few days.   Patient underwent successful cardioversion on 03/13/18. She had brief episode of "out of rhythm" per telephone note 03/17/18. Advised to use PRN propranolol.   Here today for follow up. She felt well while in sinus rhythm for 3  days. She is feeling nauseated and dizzy since on amiodarone. She continues to feel fatigue and weak due to out of rhythm. Has lower extremity edema, improves with leg elevation.    Past Medical History:  Diagnosis Date  . A-fib ([place])   . Aortic stenosis    mild by echo 03/2012  . Asthma   . Atrial fibrillation ([place])   . CAD (coronary artery disease)    s/p PCI with BMS to mid LAD--2/08 not on aspirin due to frequent ulcers.  Repeat cath for CP 09/2012 with patent LAD stent and otherwise normal coronary arteries  . Cervical stenosis of spine   . Depression   . Dyslipidemia   . Elbow fracture, right    history of- never any surgery  . GERD (gastroesophageal reflux disease)   . Headache   . Hypertension   . Impaired physical mobility    due to rheumatoid arthritis "ambulates with walker" limited free standing.  . Intestinal polyp   . Macular degeneration   . Obesity   . Osteoporosis    alendronate since 2012  . PUD (peptic ulcer disease)    can not take aspirin  . PVC's (premature ventricular contractions)   . RA (rheumatoid arthritis) ([place])   . Rheumatoid arthritis(714.0)    Dr. Lenna Gilford follows  . Spondylolisthesis of lumbar region     Past Surgical History:  Procedure Laterality Date  . ANKLE FUSION Bilateral    x2 both  . BALLOON DILATION N/A 01/10/2018   Procedure: BALLOON DILATION;  Surgeon: [name],  MD;  Location: [place];  Service: Gastroenterology;  Laterality: N/A;  . BALLOON DILATION N/A 01/24/2018   Procedure: Larrie Kass DILATION;  Surgeon: [name], MD;  Location: [place];  Service: Gastroenterology;  Laterality: N/A;  W/ Fluoroscopy. Please use colonoscope.  Marland Kitchen CARDIAC CATHETERIZATION  09/2012   patent LAD stent and otherwise normal coronary arteries  . CARDIAC CATHETERIZATION N/A 03/07/2016   Procedure: Left Heart Cath and Coronary Angiography;  Surgeon: Peter M Martinique, MD;  Location: [place] CV LAB;  Service: Cardiovascular;  Laterality: N/A;  .  CARDIOVERSION N/A 03/13/2018   Procedure: CARDIOVERSION;  Surgeon: Lelon Perla, MD;  Location: [facility] ENDOSCOPY;  Service: Cardiovascular;  Laterality: N/A;  . CATARACT EXTRACTION, BILATERAL Bilateral   . CERVICAL FUSION     limited  ROM  . CHOLECYSTECTOMY    . ESOPHAGOGASTRODUODENOSCOPY N/A 01/07/2016   Procedure: ESOPHAGOGASTRODUODENOSCOPY (EGD);  Surgeon: Wilford Corner, MD;  Location: [facility];  Service: Endoscopy;  Laterality: N/A;  . ESOPHAGOGASTRODUODENOSCOPY N/A 01/10/2018   Procedure: ESOPHAGOGASTRODUODENOSCOPY (EGD);  Surgeon: [name], MD;  Location: [place];  Service: Gastroenterology;  Laterality: N/A;  . ESOPHAGOGASTRODUODENOSCOPY N/A 01/24/2018   Procedure: ESOPHAGOGASTRODUODENOSCOPY (EGD);  Surgeon: [name], MD;  Location: [place];  Service: Gastroenterology;  Laterality: N/A;  . ESOPHAGOGASTRODUODENOSCOPY (EGD) WITH PROPOFOL N/A 11/01/2015   Procedure: ESOPHAGOGASTRODUODENOSCOPY (EGD) WITH PROPOFOL w/ Dialtation;  Surgeon: Garlan Fair, MD;  Location: [facility] ENDOSCOPY;  Service: Endoscopy;  Laterality: N/A;  . FRACTURE SURGERY  09/2011   left ankle screw and pin removed  . HAND SURGERY Bilateral    x4 digits 2-5 both hands  . JOINT REPLACEMENT Bilateral    BTKA  . TONSILLECTOMY      Current Medications: Prior to Admission medications   Medication Sig Start Date End Date Taking? Authorizing Provider  acebutolol (SECTRAL) 200 MG capsule Take 1 capsule (200 mg total) by mouth daily. 03/10/18   Leanor Kail, PA  amiodarone (PACERONE) 200 MG tablet Take 1 tablet (200 mg total) by mouth daily. 03/10/18   Jeri Rawlins, Crista Luria, PA  apixaban (ELIQUIS) 5 MG TABS tablet Take 1 tablet (5 mg total) by mouth 2 (two) times daily. 07/31/17   Daune Perch, NP  butalbital-acetaminophen-caffeine (FIORICET, ESGIC) [phone] MG per tablet Take 1 tablet by mouth every 6 (six) hours as needed for headache or migraine.  06/23/12   [provider]  Calcium  Carbonate-Vitamin D (CALCIUM 500+D PO) Take 1 tablet by mouth 2 (two) times daily.     [provider]  DENTAGEL 1.1 % GEL dental gel Place 1 application onto teeth at bedtime.  12/12/15   [provider]  diclofenac sodium (VOLTAREN) 1 % GEL Apply 2 grams topically at bedtime to shoulders and elbows 01/29/18   Meredith Staggers, MD  digoxin (LANOXIN) 0.125 MG tablet Take 125 mcg by mouth daily. 01/03/18   [provider]  Docusate Calcium (STOOL SOFTENER PO) Take 1 tablet by mouth at bedtime.    [provider]  escitalopram (LEXAPRO) 5 MG tablet Take 5 mg by mouth in the morning 02/19/18   Meredith Staggers, MD  ferrous sulfate 325 (65 FE) MG tablet Take 325 mg by mouth daily with breakfast.    [provider]  folic acid (FOLVITE) 1 MG tablet Take 1 mg by mouth daily.  12/03/13   [provider]  furosemide (LASIX) 40 MG tablet TAKE 1 TABLET BY MOUTH EVERY DAY TAKE 1/2 TAB AS NEEDED FOR EXTRA SWELLING 02/11/18  Nahser, Wonda Cheng, MD  lidocaine (LIDODERM) 5 % PLACE 3 PATCHES ONTO THE SKIN EVERY 12 (TWELVE) HOURS. APPLY NECK AND ELBOW Patient taking differently: Place 2 patches onto the skin every 12 (twelve) hours.  11/01/17   Meredith Staggers, MD  LORazepam (ATIVAN) 2 MG tablet Take 2 mg by mouth at bedtime. 02/05/15   [provider]  methotrexate (RHEUMATREX) 2.5 MG tablet Take 15 mg by mouth every Saturday.  03/01/12   [provider]  nitroGLYCERIN (NITROSTAT) 0.4 MG SL tablet Place 1 tablet (0.4 mg total) under the tongue every 5 (five) minutes as needed for chest pain. 02/21/16   Sueanne Margarita, MD  nystatin cream (MYCOSTATIN) Apply 1 application topically daily as needed for dry skin.    [provider]  oxyCODONE-acetaminophen (PERCOCET) 7.5-325 MG tablet Take 1 tablet by mouth every 6 (six) hours as needed for moderate pain. 01/29/18   Meredith Staggers, MD  pantoprazole (PROTONIX) 40 MG tablet Take 40 mg by mouth 2  (two) times daily.     [provider]  predniSONE (DELTASONE) 5 MG tablet Take 5 mg by mouth daily with breakfast.     [provider]  PREVIDENT 0.2 % SOLN Take 1 each by mouth See admin instructions. Rinses with about 1 capful nightly 09/05/12   [provider]  propranolol (INDERAL) 10 MG tablet Take 1 tablet (10 mg total) by mouth as needed (for palpitations). 03/10/18   Sarahjane Matherly, Crista Luria, PA  rosuvastatin (CRESTOR) 10 MG tablet Take 1 tablet (10 mg total) by mouth daily. 03/11/18 06/09/18  Breiana Stratmann, Crista Luria, PA  TOVIAZ 8 MG TB24 tablet Take 8 mg by mouth daily. 10/25/15   [provider]  Turmeric 500 MG CAPS Take 500 mg by mouth daily.    [provider]  vitamin B-12 (CYANOCOBALAMIN) 1000 MCG tablet Take 1,000 mcg by mouth daily.    [provider]  vitamin C (ASCORBIC ACID) 500 MG tablet Take 500 mg by mouth 2 (two) times daily.    [provider]    Allergies:   Peanut-containing drug products; Reglan [metoclopramide]; Clindamycin/lincomycin; Codeine; Iohexol; Pineapple; and Shellfish allergy   Social History   Socioeconomic History  . Marital status: Widowed    Spouse name: Not on file  . Number of children: Not on file  . Years of education: Not on file  . Highest education level: Not on file  Occupational History  . Not on file  Social Needs  . Financial resource strain: Not on file  . Food insecurity:    Worry: Not on file    Inability: Not on file  . Transportation needs:    Medical: Not on file    Non-medical: Not on file  Tobacco Use  . Smoking status: Never Smoker  . Smokeless tobacco: Never Used  Substance and Sexual Activity  . Alcohol use: No  . Drug use: No  . Sexual activity: Not on file  Lifestyle  . Physical activity:    Days per week: Not on file    Minutes per session: Not on file  . Stress: Not on file  Relationships  . Social connections:    Talks on phone: Not on file    Gets  together: Not on file    Attends religious service: Not on file    Active member of club or organization: Not on file    Attends meetings of clubs or organizations: Not on file    Relationship status:  Not on file  Other Topics Concern  . Not on file  Social History Narrative  . Not on file     Family History:  The patient's family history includes Arrhythmia in her mother; Lung cancer in her mother; Melanoma in her brother.   ROS:   Please see the history of present illness.    ROS All other systems reviewed and are negative.   PHYSICAL EXAM:   VS:  BP 98/60   Pulse 98   Ht 5\' 2"  (1.575 m)   Wt 108 lb 12.8 oz (49.4 kg)   BMI 19.90 kg/m    GEN: Well nourished, well developed, in no acute distress  HEENT: normal  Neck: no JVD, carotid bruits, or masses Cardiac: Irregular ; no murmurs, rubs, or gallops, 1 + BL LE edema  Respiratory:  clear to auscultation bilaterally, normal work of breathing GI: soft, nontender, nondistended, + BS MS: no deformity or atrophy  Skin: warm and dry, no rash Neuro:  Alert and Oriented x 3, Strength and sensation are intact Psych: euthymic mood, full affect  Wt Readings from Last 3 Encounters:  03/27/18 108 lb 12.8 oz (49.4 kg)  03/10/18 106 lb 1.9 oz (48.1 kg)  03/05/18 107 lb (48.5 kg)      Studies/Labs Reviewed:   EKG:  EKG is ordered today.  The ekg ordered today demonstrates aflutter at variable rate  Recent Labs: 07/31/2017: TSH 2.350 03/10/2018: BUN 18; Creatinine, Ser 0.59; Hemoglobin 14.3; Platelets 190; Potassium 4.2; Sodium 135   Lipid Panel    Component Value Date/Time   CHOL 129 02/24/2015 1030   TRIG 81 01/09/2016 0515   HDL 58.80 02/24/2015 1030   CHOLHDL 2 02/24/2015 1030   VLDL 17.6 02/24/2015 1030   LDLCALC 53 02/24/2015 1030    Additional studies/ records that were reviewed today include:   Echocardiogram: 01/2016 Left ventricle: The cavity size was normal. Wall thickness was   normal. The estimated ejection  fraction was 45%. Diffuse   hypokinesis. Although no diagnostic regional wall motion   abnormality was identified, this possibility cannot be completely   excluded on the basis of this study. - Aortic valve: There was moderate stenosis. Mean gradient (S): 16   mm Hg. Peak gradient (S): 29 mm Hg. VTI ratio of LVOT to aortic   valve: 0.18. - Mitral valve: Mildly thickened leaflets . There was mild   regurgitation. - Left atrium: The atrium was moderately to severely dilated. - Right atrium: Central venous pressure (est): 3 mm Hg. - Tricuspid valve: There was moderate regurgitation. - Pulmonary arteries: PA peak pressure: 34 mm Hg (S). - Pericardium, extracardiac: There was no pericardial effusion.  Impressions:  - Normal LV wall thickness with LVEF approximately 45%.   Indeterinate diastolic function. Mildly thickened mitral leaflets   with mild mitral regurgitation. Moderate to severe left atrial   enlargement. Moderate aortic stenosis based on gradients and   planimatry. Moderate tricuspid regurgitation with PASP 34 mmHg.  Left Heart Cath and Coronary Angiography 03/2016  Conclusion    Prox LAD to Mid LAD lesion, 10% stenosed. The lesion was previously treated with a bare metal stent greater than two years ago.  The left ventricular systolic function is normal.  1. No obstructive CAD. Stent in the mid LAD is widely patent. 2. Good overall LV function. There is focal hypokinesis of the basal inferior wall that has been present since cardiac cath in 2005.    ASSESSMENT & PLAN:  1. CAD s/p remote LAD stenting - Last cath 03/2016 showed patent stent with no obstructive CAD.  No angina.  Not on aspirin due to need of anticoagulation.  Continue statin.  2.  Acute on chronic systolic heart failure - Last echo in 2017 showed LVEF of 45%. Felt tachy mediated as cath was reassuring.   -Her lower extremity edema is likely due to elevated rate.  Compliant with low-sodium diet.  Continue daily lasix.   3.  persistent atrial flutter with variable rate -Stayed in sinus rhythm only for 3 days and felt good. She is symptomatic with afib/aflutter. Will reduce amiodarone to 100mg  due to nausea. ? Due to GI issue as below but new since amiodarone. Will refer to afib clinic or Dr. Curt Bears for antiarrhythmic options vs ablation. Continue Acetubutol and PRN propranolol. Continue Eliquis for anticoagulation.    4. Gastric outlet obstruction/ Upcoming surgery - She has 25mm tube at junction. Lost 40lb in last yarr. She is schedule to have surgery on June 11 with bigger tube.  Compliant with Eliquis, DCCV done on 03/10/18. Need to be review during EP eval.    Medication Adjustments/Labs and Tests Ordered: Current medicines are reviewed at length with the patient today.  Concerns regarding medicines are outlined above.  Medication changes, Labs and Tests ordered today are listed in the Patient Instructions below. Patient Instructions  Medication Instructions:  Your physician has recommended you make the following change in your medication: 1.  DECREASE the Amiodarone to 100 mg daily   Labwork: None ordered  Testing/Procedures: None ordered  Follow-Up: Your physician recommends that you schedule a follow-up appointment in: NEXT WEEK WITH DR. Curt Bears (IF HE DON'T HAVE ANY AVAILABILITY,) PT IS TO SEE AFIB CLINIC  Your physician recommends that you schedule a follow-up appointment in: [place] Acie Fredrickson 05/23/18   Any Other Special Instructions Will Be Listed Below (If Applicable).     If you need a refill on your cardiac medications before your next appointment, please call your pharmacy.      Jarrett Soho, [place]  03/27/2018 11:43 AM    [facility] [address], [place], [place]  [address] Phone: [phone]; Fax: [phone]

## 2018-03-27 NOTE — Patient Instructions (Addendum)
Medication Instructions:  Your physician has recommended you make the following change in your medication: 1.  DECREASE the Amiodarone to 100 mg daily   Labwork: None ordered  Testing/Procedures: None ordered  Follow-Up: Your physician recommends that you schedule a follow-up appointment in: NEXT WEEK WITH DR. Curt Bears (IF HE DON'T HAVE ANY AVAILABILITY,) PT IS TO SEE AFIB CLINIC  Your physician recommends that you schedule a follow-up appointment in: [place] Acie Fredrickson 05/23/18   Any Other Special Instructions Will Be Listed Below (If Applicable).     If you need a refill on your cardiac medications before your next appointment, please call your pharmacy.

## 2018-04-03 ENCOUNTER — Ambulatory Visit (INDEPENDENT_AMBULATORY_CARE_PROVIDER_SITE_OTHER): Payer: Medicare Other | Admitting: Cardiology

## 2018-04-03 ENCOUNTER — Encounter: Payer: Self-pay | Admitting: Cardiology

## 2018-04-03 VITALS — BP 100/66 | HR 106 | Ht 61.0 in | Wt 107.8 lb

## 2018-04-03 DIAGNOSIS — I483 Typical atrial flutter: Secondary | ICD-10-CM | POA: Diagnosis not present

## 2018-04-03 DIAGNOSIS — I255 Ischemic cardiomyopathy: Secondary | ICD-10-CM | POA: Diagnosis not present

## 2018-04-03 DIAGNOSIS — I2510 Atherosclerotic heart disease of native coronary artery without angina pectoris: Secondary | ICD-10-CM | POA: Diagnosis not present

## 2018-04-03 DIAGNOSIS — I480 Paroxysmal atrial fibrillation: Secondary | ICD-10-CM | POA: Diagnosis not present

## 2018-04-03 MED ORDER — METOPROLOL SUCCINATE ER 50 MG PO TB24: 50.0000 mg | ORAL_TABLET | Freq: Every day | ORAL | 3 refills | Status: DC

## 2018-04-03 NOTE — Progress Notes (Signed)
Electrophysiology Office Note   Date:  04/03/2018   ID:  Angela [name], DOB 09/02/1936, MRN [id number]  PCP:  [name], MD  Cardiologist:  Odetta Pink Primary Electrophysiologist:  Constance Haw, MD    No chief complaint on file.    History of Present Illness: Angela [name] is a 82 y.o. female who is being seen today for the evaluation of atrial flutter at the request of [place]. Presenting today for electrophysiology evaluation.  She has a history of coronary disease status post LAD PCI, hyperlipidemia, hypertension, PVCs, report arthritis, aortic stenosis, paroxysmal atrial fibrillation/flutter, and chronic cardiomyopathy.  She was hospitalized 01/2016 with gastric outlet obstruction.  She had a polypoid mass that was negative for malignancy.  Her hospital course was complicated by atrial flutter.  She was put on Eliquis at that time.  Her ejection fraction was noted to be 45%.  She underwent cath that showed nonobstructive coronary disease.  She was noted to be in atrial fibrillation 02/2018.  She underwent successful cardioversion 03/13/2018.  She had a brief episode of being out of rhythm on 03/17/2018.  She is currently on amiodarone, though she does have symptoms of nausea and dizziness.  Her symptoms with atrial flutter are weakness and fatigue.    Today, she denies symptoms of palpitations, chest pain, shortness of breath, orthopnea, PND, lower extremity edema, claudication, dizziness, presyncope, syncope, bleeding, or neurologic sequela. The patient is tolerating medications without difficulties.    Past Medical History:  Diagnosis Date  . A-fib ([place])   . Aortic stenosis    mild by echo 03/2012  . Asthma   . Atrial fibrillation ([place])   . CAD (coronary artery disease)    s/p PCI with BMS to mid LAD--2/08 not on aspirin due to frequent ulcers.  Repeat cath for CP 09/2012 with patent LAD stent and otherwise normal coronary arteries  . Cervical stenosis of spine     . Depression   . Dyslipidemia   . Elbow fracture, right    history of- never any surgery  . GERD (gastroesophageal reflux disease)   . Headache   . Hypertension   . Impaired physical mobility    due to rheumatoid arthritis "ambulates with walker" limited free standing.  . Intestinal polyp   . Macular degeneration   . Obesity   . Osteoporosis    alendronate since 2012  . PUD (peptic ulcer disease)    can not take aspirin  . PVC's (premature ventricular contractions)   . RA (rheumatoid arthritis) ([place])   . Rheumatoid arthritis(714.0)    Dr. Lenna Gilford follows  . Spondylolisthesis of lumbar region    Past Surgical History:  Procedure Laterality Date  . ANKLE FUSION Bilateral    x2 both  . BALLOON DILATION N/A 01/10/2018   Procedure: BALLOON DILATION;  Surgeon: [name], MD;  Location: [facility];  Service: Gastroenterology;  Laterality: N/A;  . BALLOON DILATION N/A 01/24/2018   Procedure: Larrie Kass DILATION;  Surgeon: [name], MD;  Location: [facility];  Service: Gastroenterology;  Laterality: N/A;  W/ Fluoroscopy. Please use colonoscope.  Marland Kitchen CARDIAC CATHETERIZATION  09/2012   patent LAD stent and otherwise normal coronary arteries  . CARDIAC CATHETERIZATION N/A 03/07/2016   Procedure: Left Heart Cath and Coronary Angiography;  Surgeon: Peter M Martinique, MD;  Location: [facility];  Service: Cardiovascular;  Laterality: N/A;  . CARDIOVERSION N/A 03/13/2018   Procedure: CARDIOVERSION;  Surgeon: Lelon Perla, MD;  Location: [facility];  Service: Cardiovascular;  Laterality: N/A;  . CATARACT EXTRACTION, BILATERAL Bilateral   . CERVICAL FUSION     limited  ROM  . CHOLECYSTECTOMY    . ESOPHAGOGASTRODUODENOSCOPY N/A 01/07/2016   Procedure: ESOPHAGOGASTRODUODENOSCOPY (EGD);  Surgeon: Wilford Corner, MD;  Location: [facility];  Service: Endoscopy;  Laterality: N/A;  . ESOPHAGOGASTRODUODENOSCOPY N/A 01/10/2018   Procedure: ESOPHAGOGASTRODUODENOSCOPY (EGD);  Surgeon: [name], MD;  Location: [facility];  Service: Gastroenterology;  Laterality: N/A;  . ESOPHAGOGASTRODUODENOSCOPY N/A 01/24/2018   Procedure: ESOPHAGOGASTRODUODENOSCOPY (EGD);  Surgeon: [name], MD;  Location: [facility];  Service: Gastroenterology;  Laterality: N/A;  . ESOPHAGOGASTRODUODENOSCOPY (EGD) WITH PROPOFOL N/A 11/01/2015   Procedure: ESOPHAGOGASTRODUODENOSCOPY (EGD) WITH PROPOFOL w/ Dialtation;  Surgeon: Garlan Fair, MD;  Location: [facility];  Service: Endoscopy;  Laterality: N/A;  . FRACTURE SURGERY  09/2011   left ankle screw and pin removed  . HAND SURGERY Bilateral    x4 digits 2-5 both hands  . JOINT REPLACEMENT Bilateral    BTKA  . TONSILLECTOMY       Current Outpatient Medications  Medication Sig Dispense Refill  . acebutolol (SECTRAL) 200 MG capsule Take 1 capsule (200 mg total) by mouth daily. 90 capsule 3  . amiodarone (PACERONE) 100 MG tablet Take 1 tablet (100 mg total) by mouth daily. 90 tablet 3  . apixaban (ELIQUIS) 5 MG TABS tablet Take 1 tablet (5 mg total) by mouth 2 (two) times daily. 180 tablet 3  . butalbital-acetaminophen-caffeine (FIORICET, ESGIC) 50-325-40 MG per tablet Take 1 tablet by mouth every 6 (six) hours as needed for headache or migraine.     . Calcium Carbonate-Vitamin D (CALCIUM 500+D PO) Take 1 tablet by mouth 2 (two) times daily.     . DENTAGEL 1.1 % GEL dental gel Place 1 application onto teeth at bedtime.     . diclofenac sodium (VOLTAREN) 1 % GEL Apply 2 grams topically at bedtime to shoulders and elbows 300 g 4  . digoxin (LANOXIN) 0.125 MG tablet Take 125 mcg by mouth daily.  3  . Docusate Calcium (STOOL SOFTENER PO) Take 1 tablet by mouth at bedtime.    Marland Kitchen escitalopram (LEXAPRO) 5 MG tablet Take 5 mg by mouth in the morning 90 tablet 1  . ferrous sulfate 325 (65 FE) MG tablet Take 325 mg by mouth daily with breakfast.    . folic acid (FOLVITE) 1 MG tablet Take 1 mg by mouth daily.     . furosemide (LASIX) 40 MG tablet TAKE 1  TABLET BY MOUTH EVERY DAY TAKE 1/2 TAB AS NEEDED FOR EXTRA SWELLING 45 tablet 7  . lidocaine (LIDODERM) 5 % PLACE 3 PATCHES ONTO THE SKIN EVERY 12 (TWELVE) HOURS. APPLY NECK AND ELBOW (Patient taking differently: Place 2 patches onto the skin every 12 (twelve) hours. ) 90 patch 5  . LORazepam (ATIVAN) 2 MG tablet Take 2 mg by mouth at bedtime.  5  . methotrexate (RHEUMATREX) 2.5 MG tablet Take 15 mg by mouth every Saturday.     . nitroGLYCERIN (NITROSTAT) 0.4 MG SL tablet Place 1 tablet (0.4 mg total) under the tongue every 5 (five) minutes as needed for chest pain. 25 tablet 1  . nystatin cream (MYCOSTATIN) Apply 1 application topically daily as needed for dry skin.    Marland Kitchen oxyCODONE-acetaminophen (PERCOCET) 7.5-325 MG tablet Take 1 tablet by mouth every 6 (six) hours as needed for moderate pain. 120 tablet 0  . pantoprazole (PROTONIX) 40 MG tablet Take 40 mg by mouth  2 (two) times daily.     . predniSONE (DELTASONE) 5 MG tablet Take 5 mg by mouth daily with breakfast.     . PREVIDENT 0.2 % SOLN Take 1 each by mouth See admin instructions. Rinses with about 1 capful nightly    . propranolol (INDERAL) 10 MG tablet Take 1 tablet (10 mg total) by mouth as needed (for palpitations). 30 tablet 6  . rosuvastatin (CRESTOR) 10 MG tablet Take 1 tablet (10 mg total) by mouth daily. 90 tablet 3  . TOVIAZ 8 MG TB24 tablet Take 8 mg by mouth daily.    . Turmeric 500 MG CAPS Take 500 mg by mouth daily.    . vitamin B-12 (CYANOCOBALAMIN) 1000 MCG tablet Take 1,000 mcg by mouth daily.    . vitamin C (ASCORBIC ACID) 500 MG tablet Take 500 mg by mouth 2 (two) times daily.     No current facility-administered medications for this visit.     Allergies:   Peanut-containing drug products; Reglan [metoclopramide]; Clindamycin/lincomycin; Codeine; Iohexol; Pineapple; and Shellfish allergy   Social History:  The patient  reports that she has never smoked. She has never used smokeless tobacco. She reports that she does  not drink alcohol or use drugs.   Family History:  The patient's family history includes Arrhythmia in her mother; Lung cancer in her mother; Melanoma in her brother.    ROS:  Please see the history of present illness.   Otherwise, review of systems is positive for weight loss, leg swelling, palpitations, hearing loss, constipation, anxiety, joint swelling, dizziness.   All other systems are reviewed and negative.    PHYSICAL EXAM: VS:  BP 100/66   Pulse (!) 106   Ht 5\' 1"  (1.549 m)   Wt 107 lb 12.8 oz (48.9 kg)   SpO2 99%   BMI 20.37 kg/m  , BMI Body mass index is 20.37 kg/m. GEN: Well nourished, well developed, in no acute distress  HEENT: normal  Neck: no JVD, carotid bruits, or masses Cardiac: iRRR; no murmurs, rubs, or gallops,no edema  Respiratory:  clear to auscultation bilaterally, normal work of breathing GI: soft, nontender, nondistended, + BS MS: no deformity or atrophy  Skin: warm and dry Neuro:  Strength and sensation are intact Psych: euthymic mood, full affect  EKG:  EKG is ordered today. Personal review of the ekg ordered shows atrial flutter  Recent Labs: 07/31/2017: TSH 2.350 03/10/2018: BUN 18; Creatinine, Ser 0.59; Hemoglobin 14.3; Platelets 190; Potassium 4.2; Sodium 135    Lipid Panel     Component Value Date/Time   CHOL 129 02/24/2015 1030   TRIG 81 01/09/2016 0515   HDL 58.80 02/24/2015 1030   CHOLHDL 2 02/24/2015 1030   VLDL 17.6 02/24/2015 1030   LDLCALC 53 02/24/2015 1030     Wt Readings from Last 3 Encounters:  04/03/18 107 lb 12.8 oz (48.9 kg)  03/27/18 108 lb 12.8 oz (49.4 kg)  03/10/18 106 lb 1.9 oz (48.1 kg)      Other studies Reviewed: Additional studies/ records that were reviewed today include: TTE 01/14/16  Review of the above records today demonstrates:  - Left ventricle: The cavity size was normal. Wall thickness was   normal. The estimated ejection fraction was 45%. Diffuse   hypokinesis. Although no diagnostic regional  wall motion   abnormality was identified, this possibility cannot be completely   excluded on the basis of this study. - Aortic valve: There was moderate stenosis. Mean gradient (S): 16  mm Hg. Peak gradient (S): 29 mm Hg. VTI ratio of LVOT to aortic   valve: 0.18. - Mitral valve: Mildly thickened leaflets . There was mild   regurgitation. - Left atrium: The atrium was moderately to severely dilated. - Right atrium: Central venous pressure (est): 3 mm Hg. - Tricuspid valve: There was moderate regurgitation. - Pulmonary arteries: PA peak pressure: 34 mm Hg (S). - Pericardium, extracardiac: There was no pericardial effusion.  LHC 03/07/16  Prox LAD to Mid LAD lesion, 10% stenosed. The lesion was previously treated with a bare metal stent greater than two years ago.  The left ventricular systolic function is normal.   1. No obstructive CAD. Stent in the mid LAD is widely patent. 2. Good overall LV function. There is focal hypokinesis of the basal inferior wall that has been present since cardiac cath in 2005.   ASSESSMENT AND PLAN:  1.  Persistent atrial fibrillation/atrial flutter: Currently on amiodarone 100 mg due to nausea.  She is also on Eliquis.  She returns to clinic today back in atrial flutter.  At this point, I would prefer not to go forward with ablation, as he does have quite a few chronic medical conditions.  Should she not be well controlled on amiodarone, ablation would be an option.  She is having a GI procedure performed June 11.  She would need to be off anticoagulation for that procedure.  I Zeda Gangwer see her back 2 weeks after for possible repeat cardioversion.  She would also potentially be a dofetilide candidate.  This patients CHA2DS2-VASc Score and unadjusted Ischemic Stroke Rate (% per year) is equal to 9.7 % stroke rate/year from a score of 6  Above score calculated as 1 point each if present [CHF, HTN, DM, Vascular=MI/PAD/Aortic Plaque, Age if 65-74, or Female] Above  score calculated as 2 points each if present [Age > 75, or Stroke/TIA/TE]  2.  Coronary artery disease: Status post LAD stenting.  Cath with nonobstructive disease in 2017.  No current chest pain  3.  Chronic systolic heart failure: EF 45% on echo.  Was thought to be tachycardia mediated at the time in 2017.   Current medicines are reviewed at length with the patient today.   The patient does not have concerns regarding her medicines.  The following changes were made today:  none  Labs/ tests ordered today include:  Orders Placed This Encounter  Procedures  . EKG 12-Lead   Case discussed with primary cardiology  Disposition:   FU with Oluwaseun Bruyere 1 months  Signed, Gomer France Meredith Leeds, MD  04/03/2018 8:50 AM     [facility] [address] [place] [address] [phone] (office) [phone] (fax)

## 2018-04-03 NOTE — Patient Instructions (Addendum)
Medication Instructions:  Your physician has recommended you make the following change in your medication:  1. START Metoprolol Succinate (Toprol) 50 mg once daily  Labwork: None ordered  Testing/Procedures: None ordered  Follow-Up: Your physician recommends that you schedule a follow-up appointment at the end of June 2019 with Dr. Curt Bears.   * If you need a refill on your cardiac medications before your next appointment, please call your pharmacy.   *Please note that any paperwork needing to be filled out by the provider will need to be addressed at the front desk prior to seeing the provider. Please note that any FMLA, disability or other documents regarding health condition is subject to a $25.00 charge that must be received prior to completion of paperwork in the form of a money order or check.  Thank you for choosing [facility]!!   Trinidad Curet, RN [phone]  Any Other Special Instructions Will Be Listed Below (If Applicable).  Metoprolol extended-release tablets What is this medicine? METOPROLOL (me TOE proe lole) is a beta-blocker. Beta-blockers reduce the workload on the heart and help it to beat more regularly. This medicine is used to treat high blood pressure and to prevent chest pain. It is also used to after a heart attack and to prevent an additional heart attack from occurring. This medicine may be used for other purposes; ask your health care provider or pharmacist if you have questions. COMMON BRAND NAME(S): toprol, Toprol XL What should I tell my health care provider before I take this medicine? They need to know if you have any of these conditions: -diabetes -heart or vessel disease like slow heart rate, worsening heart failure, heart block, sick sinus syndrome or Raynaud's disease -kidney disease -liver disease -lung or breathing disease, like asthma or emphysema -pheochromocytoma -thyroid disease -an unusual or allergic reaction to metoprolol,  other beta-blockers, medicines, foods, dyes, or preservatives -pregnant or trying to get pregnant -breast-feeding How should I use this medicine? Take this medicine by mouth with a glass of water. Follow the directions on the prescription label. Do not crush or chew. Take this medicine with or immediately after meals. Take your doses at regular intervals. Do not take more medicine than directed. Do not stop taking this medicine suddenly. This could lead to serious heart-related effects. Talk to your pediatrician regarding the use of this medicine in children. While this drug may be prescribed for children as young as 6 years for selected conditions, precautions do apply. Overdosage: If you think you have taken too much of this medicine contact a poison control center or emergency room at once. NOTE: This medicine is only for you. Do not share this medicine with others. What if I miss a dose? If you miss a dose, take it as soon as you can. If it is almost time for your next dose, take only that dose. Do not take double or extra doses. What may interact with this medicine? This medicine may interact with the following medications: -certain medicines for blood pressure, heart disease, irregular heart beat -certain medicines for depression, like monoamine oxidase (MAO) inhibitors, fluoxetine, or paroxetine -clonidine -dobutamine -epinephrine -isoproterenol -reserpine This list may not describe all possible interactions. Give your health care provider a list of all the medicines, herbs, non-prescription drugs, or dietary supplements you use. Also tell them if you smoke, drink alcohol, or use illegal drugs. Some items may interact with your medicine. What should I watch for while using this medicine? Visit your doctor or health  care professional for regular check ups. Contact your doctor right away if your symptoms worsen. Check your blood pressure and pulse rate regularly. Ask your health care  professional what your blood pressure and pulse rate should be, and when you should contact them. You may get drowsy or dizzy. Do not drive, use machinery, or do anything that needs mental alertness until you know how this medicine affects you. Do not sit or stand up quickly, especially if you are an older patient. This reduces the risk of dizzy or fainting spells. Contact your doctor if these symptoms continue. Alcohol may interfere with the effect of this medicine. Avoid alcoholic drinks. What side effects may I notice from receiving this medicine? Side effects that you should report to your doctor or health care professional as soon as possible: -allergic reactions like skin rash, itching or hives -cold or numb hands or feet -depression -difficulty breathing -faint -fever with sore throat -irregular heartbeat, chest pain -rapid weight gain -swollen legs or ankles Side effects that usually do not require medical attention (report to your doctor or health care professional if they continue or are bothersome): -anxiety or nervousness -change in sex drive or performance -dry skin -headache -nightmares or trouble sleeping -short term memory loss -stomach upset or diarrhea -unusually tired This list may not describe all possible side effects. Call your doctor for medical advice about side effects. You may report side effects to FDA at 1-800-FDA-1088. Where should I keep my medicine? Keep out of the reach of children. Store at room temperature between 15 and 30 degrees C (59 and 86 degrees F). Throw away any unused medicine after the expiration date. NOTE: This sheet is a summary. It may not cover all possible information. If you have questions about this medicine, talk to your doctor, pharmacist, or health care provider.  2018 Elsevier/Gold Standard (2013-06-26 14:41:37)

## 2018-04-08 ENCOUNTER — Telehealth: Payer: Self-pay | Admitting: Cardiovascular Disease

## 2018-04-08 NOTE — Telephone Encounter (Signed)
Follow Up:   Angela [name] calling to check on the status of clearance that was sent on 02-03-18.Pt surgery is scheduled for 04-15-18. Need this clearance fax asap to [phone] [id number]

## 2018-04-08 NOTE — Telephone Encounter (Signed)
   Primary Cardiologist: Previously Dr. Odetta Pink, also seen by Dr. Curt Bears  Chart reviewed as part of pre-operative protocol coverage.  Just saw Dr. Curt Bears 5/30 who referenced the procedure below but did not make formal statement on clearance.  Dr. Curt Bears - just wanted to make sure you are OK with her proceeding with endoscopy as planned. I assumed this was the case but wanted to be sure no other f/u needed to reassess HR before then. GI requested to hold Eliquis one day prior which pharmD agreed with. Please route your reply to [facility] PREOP, thanks!  Charlie Pitter, PA-C 04/08/2018, 5:01 PM

## 2018-04-08 NOTE — Telephone Encounter (Deleted)
   Primary Cardiologist: No primary care provider on file.  Chart reviewed as part of pre-operative protocol coverage. Patient was contacted 04/08/2018 in reference to pre-operative risk assessment for pending surgery as outlined below.  JYRA LAGARES was last seen on *** by ***.  Since that day, VEENA STURGESS has done ***.  Therefore, based on ACC/AHA guidelines, the patient would be at acceptable risk for the planned procedure without further cardiovascular testing.   I will route this recommendation to the requesting party via Epic fax function and remove from pre-op pool.  Please call with questions.  Charlie Pitter, PA-C 04/08/2018, 2:48 PM

## 2018-04-09 NOTE — Telephone Encounter (Signed)
At this point, intermediate risk for low risk procedure. OK to stop anticoagulation as per endoscopy recs. Restart as soon as possible post procedure.

## 2018-04-09 NOTE — Telephone Encounter (Signed)
   Primary Cardiologist: Previously Dr. Odetta Pink, also seen by Dr. Curt Bears  Chart reviewed as part of pre-operative protocol coverage. Dr. Curt Bears was contacted and states "At this point, intermediate risk for low risk procedure. OK to stop anticoagulation as per endoscopy recs. Restart as soon as possible post procedure."  Pharmacist reviewed chart previously and stated, "Per office protocol, patient can hold Eliquis for 1 days prior to procedure." This is in accordance with the request that came through from Dr. Encarnacion Slates office on 4/2.  I will route this recommendation to the requesting party via Epic fax function and remove from pre-op pool.  Please call with questions.  Charlie Pitter, PA-C 04/09/2018, 1:34 PM

## 2018-04-10 ENCOUNTER — Telehealth: Payer: Self-pay | Admitting: Cardiovascular Disease

## 2018-04-10 NOTE — Telephone Encounter (Signed)
   Primary Cardiologist: Previously Dr. Odetta Pink, also seen by Dr. Curt Bears  Chart reviewed as part of pre-operative protocol coverage.   Dental extractions are considered low risk procedures per guidelines and generally do not require any specific cardiac clearance. It is also generally accepted that for simple extractions, there is no need to interrupt blood thinner therapy.   Given past medical history and time since last visit, based on ACC/AHA guidelines, Angela [name] would be at acceptable risk for the planned procedure without further cardiovascular testing.   I will route this recommendation to the requesting party via Epic fax function and remove from pre-op pool.  Please call with questions.  Charlie Pitter, PA-C 04/10/2018, 3:01 PM

## 2018-04-10 NOTE — Telephone Encounter (Signed)
New Message:          [facility] Pre-operative Risk Assessment    Request for surgical clearance:  1. What type of surgery is being performed? Tooth Exraction  2. When is this surgery scheduled? TBD   3. What type of clearance is required (medical clearance vs. Pharmacy clearance to hold med vs. Both)? Pharmacy  4. Are there any medications that need to be held prior to surgery and how long? Eliquis   5. Practice name and name of physician performing surgery? Dr. Legrand [facility]   6. What is your office phone number [phone]   7.   What is your office fax number [phone]  8.   Anesthesia type (None, local, MAC, general) ? Local   Angela [name] 04/10/2018, 1:36 PM  _________________________________________________________________   (provider comments below)

## 2018-04-14 ENCOUNTER — Encounter (HOSPITAL_COMMUNITY): Payer: Self-pay | Admitting: *Deleted

## 2018-04-14 NOTE — H&P (Signed)
History of Present Illness  General:  81/female had a repeat EGD with dilation of pyloric channel with a 15 mm balloon and post dilation I was able to advance a pediatric colonoscope in to the duodenum. I had recommended a repeat procedure in 6 weeks as she has significant stenosis and pyloric channel deformity(had residual solid food in gastric cavity despite being on clear liquid diet for 2 days for the procedure). Has gained 2 lbs since last visit and is able to tolerate more kinds of food including meats. She has more regular BMs and states that early satiety has improved to a certain extent and doesn't have as much abdominal discomfort as before.   Current Medications  Taking   Voltaren(Diclofenac Sodium (Ophth)) 1 % Gel as directed Transdermal as needed   Flonase(Fluticasone Propionate) 50 MCG/ACT Suspension 2 sprays in each nostril Nasally Once a day, prn   Oxycodone-Acetaminophen 7.5-325 MG Tablet 1 tablet Orally four times a day as needed   Lidoderm(Lidocaine) 5 % Patch 1 patch to skin remove after 12 hours Externally Once a day   Furosemide 40 MG Tablet 1 tablet, 1/2 tablet prn for extra swelling Orally once a day   Toviaz(Fesoterodine Fumarate ER) 8 MG Tablet Extended Release 24 Hour 1 tablet Orally Once a day   Folic Acid 1 MG Tablet 1 tablet Orally Once a day   Nystatin 100000 U Cream APPLY EXTERNALLY TO THE AFFECTED AREA TWICE A DAY AS NEEDED AS DIRECTED   Nitroglycerin 0.4 mg 0.4 mg tablet 1 tablet as directed SL as directed prn chest pain   EpiPen 2-Pak(EPINEPHrine) 0.3 MG/0.3ML Device as directed Injection   Cyanocobalamin 2000 MCG Tablet 1 tablet Orally Once a day   Benadryl(DiphenhydrAMINE HCl) prn   Probiotic - Tablet Delayed Release Orally   Simvastatin 40 MG Tablet 1/2 tablet Orally Once a day   Methotrexate 2.5MG  Tablet TAKE 6 TABLETS ONCE WEEKLY   PredniSONE 5 MG 5mg  Tablet 1 tab Orally Once a day   Calcium 1200+D3(Calcium-Magnesium-Vitamin D ER) 1 capsule once a day    Escitalopram Oxalate 5 MG Tablet 1 tablet Orally Once a day, in am   Stool Softener(Docusate Sodium) 100 MG Capsule 1 capsule as needed Orally Once a day   Propranolol HCl 10 MG Tablet 1 tablet Orally once a day, prn   Zantac 150 Maximum Strength(RaNITidine HCl) 150 MG Tablet 1 tablet at bedtime Orally Once a day   Eliquis 5 mg Tablet 1 tablet orally twice a day   Acebutolol HCl 200 MG Capsule 1 capsule Orally Once a day   Butalbital-APAP-Caffeine 50-325-40 MG Tablet TAKE 1 TABLET EVERY 4 HOURS AS NEEDED FOR TENSION HEADACHE   Pantoprazole Sodium 40 MG Tablet Delayed Release 1 TABLET TWICE A DAY ORALLY   Digoxin 125 MCG Tablet 1 tablet Orally Once a day   Lorazepam 2 MG Tablet 1/2 tablet at bedtime as needed Orally qhs   Medication List reviewed and reconciled with the patient    Past Medical History  coronary disease status post PCI of with BMS to mid LAD--2/08, Turner not on ASA due to frequent ulers.   Asthma.   Depression.   Dyslipidemia.   GERD.   Hypertension.   Obesity.   rheumatoid arthritis-erosive, s/p cervical spine fusion, [place].   PVCs.   Shrimp allergy.   PUD - cannot take ASA, pyloric channel ulcer 2010.   severe gastroparesis, nuclear emptying study and EGD 2010.   Osteoporosis bisphosphonates since 2005-2016.   macular degeneration, [place].  Impaired fasting glucose.   Atrial flutter while hospitalized 2017, echo moderate aortic stenosis, ejection fraction 45%, moderate to severe left atrial enlargement.   Moderate aortic stenosis - echo 2017.   GI Wynetta Emery, Pain Annie Sable, NS Dannielle Burn, cardio [place], urology macdermaid , allergry van winkle, rheum [place].    Surgical History  status post cholecystectomy   status post C1-2, [place] 2008  status post bilateral total knee replacements   status post bilateral ankle fusion   wedge excision right auricularlesion/Dr Elie Goody 07/08/08   Family History  Father: deceased  25 yrs, MVA  Mother: deceased 58 yrs, Old age  Brother 56: deceased, melanoma  Brother2: alive  2 brother(s) . 2daughter(s) .   Significant GI family history: None \\\\nNo autoimmune disease\\\\nOne daughter has passed\\nNeg family hx of colon cancer\\\/polyps or liver disease.   Social History  General:  Tobacco use  cigarettes: Never smoked Additional Findings: Tobacco Non-User Non-smoker for personal reasons Tobacco history last updated 01/16/2018 Alcohol: no.  Caffeine: yes.  no Recreational drug use.  Exercise: walking around house, .  Marital Status: Widowed January 2015.  OCCUPATION: retired, Geophysical data processor.  no Smoking.    Allergies  Codeine (for allergy): nausea  Seafood: swelling  Peanuts: tongue lips swell   Enablex: not effective   Myrbetriq: lip swollen  Pineapple Flavor: lip swollen?  Amoxicillin: swollen lips/tongue  Clindamycin 300 mg 10d: diarrhea, nausea    Hospitalization/Major Diagnostic Procedure  not in the past yr 02/2018   Review of Systems  GI PROCEDURE:  no Pacemaker/ AICD, no. no Artificial heart valves. no MI/heart attack. Abnormal heart rhythm YES, Atrial Fibrillation. no Angina. no CVA. no Hypertension. no Hypotension. no Asthma, COPD. no Sleep apnea. no Seizure disorders. Artificial joints YES. no Severe DJD. no Diabetes. no Significant headaches. Vertigo YES. Depression/anxiety YES, anxiety. Abnormal bleeding YES, Taking blood thinners. no Kidney Disease. no Liver disease, no. no Chance of pregnancy. no Blood transfusion. no Method of Birth Control. no Birth control pills.        Vital Signs  Wt 110.6, Wt change 2.2 lb, Ht 61.5, BMI 20.56, Temp 97.8, Pulse sitting 100, BP sitting 103/77.   Examination  Gastroenterology:: GENERAL APPEARANCE: Frail appearing, appears her stated age, pleasant, no acute distress.  SCLERA: anicteric.  EXTREMITIES: obvious deformities from arthritis.  PSYCH: mood/affect normal.     Assessments   1.  Pyloric stenosis in adult - K31.1 (Primary)   Treatment  1. Pyloric stenosis in adult  IMAGING: Esophagoscopy    Whitfield,Dia 02/03/2018 02:22:16 PM > spoke with Kim-scheduled for 04/15/18-[facility]-at 12pm-prep instructions reviewed with pt.   Notes: Will schedule for periodic EGD with pediatric colonoscope and balloon dilation every 6 weeks until the pylorus gets more open and patent. Will need to hold eliquis for 24 hours prior to each of such scheduled EGD for dilation and needs to be on clear liquid diet for a day before. She understands and verbalizes consent.  Referral To: Reason:egd w/dilation-propofol-spoke with [phone]

## 2018-04-15 ENCOUNTER — Ambulatory Visit (HOSPITAL_COMMUNITY): Payer: Medicare Other | Admitting: Anesthesiology

## 2018-04-15 ENCOUNTER — Encounter (HOSPITAL_COMMUNITY): Payer: Self-pay

## 2018-04-15 ENCOUNTER — Encounter (HOSPITAL_COMMUNITY)
Admission: RE | Disposition: A | Discharge status: Home / Self Care | Payer: Self-pay | Source: Ambulatory Visit | Attending: Gastroenterology

## 2018-04-15 ENCOUNTER — Ambulatory Visit (HOSPITAL_COMMUNITY)
Admission: RE | Admit: 2018-04-15 | Discharge: 2018-04-15 | Disposition: A | Discharge status: Home / Self Care | Payer: Medicare Other | Source: Ambulatory Visit | Attending: Gastroenterology | Admitting: Gastroenterology

## 2018-04-15 ENCOUNTER — Other Ambulatory Visit: Payer: Self-pay

## 2018-04-15 DIAGNOSIS — Z7901 Long term (current) use of anticoagulants: Secondary | ICD-10-CM | POA: Insufficient documentation

## 2018-04-15 DIAGNOSIS — Z79899 Other long term (current) drug therapy: Secondary | ICD-10-CM | POA: Diagnosis not present

## 2018-04-15 DIAGNOSIS — Z7952 Long term (current) use of systemic steroids: Secondary | ICD-10-CM | POA: Insufficient documentation

## 2018-04-15 DIAGNOSIS — M069 Rheumatoid arthritis, unspecified: Secondary | ICD-10-CM | POA: Diagnosis not present

## 2018-04-15 DIAGNOSIS — I2510 Atherosclerotic heart disease of native coronary artery without angina pectoris: Secondary | ICD-10-CM | POA: Diagnosis not present

## 2018-04-15 DIAGNOSIS — Z96653 Presence of artificial knee joint, bilateral: Secondary | ICD-10-CM | POA: Diagnosis not present

## 2018-04-15 DIAGNOSIS — F329 Major depressive disorder, single episode, unspecified: Secondary | ICD-10-CM | POA: Insufficient documentation

## 2018-04-15 DIAGNOSIS — I10 Essential (primary) hypertension: Secondary | ICD-10-CM | POA: Insufficient documentation

## 2018-04-15 DIAGNOSIS — K2270 Barrett's esophagus without dysplasia: Secondary | ICD-10-CM | POA: Diagnosis not present

## 2018-04-15 DIAGNOSIS — K311 Adult hypertrophic pyloric stenosis: Secondary | ICD-10-CM | POA: Insufficient documentation

## 2018-04-15 DIAGNOSIS — Z79891 Long term (current) use of opiate analgesic: Secondary | ICD-10-CM | POA: Insufficient documentation

## 2018-04-15 DIAGNOSIS — K219 Gastro-esophageal reflux disease without esophagitis: Secondary | ICD-10-CM | POA: Insufficient documentation

## 2018-04-15 DIAGNOSIS — Z91013 Allergy to seafood: Secondary | ICD-10-CM | POA: Diagnosis not present

## 2018-04-15 DIAGNOSIS — H3530 Unspecified macular degeneration: Secondary | ICD-10-CM | POA: Insufficient documentation

## 2018-04-15 DIAGNOSIS — K228 Other specified diseases of esophagus: Secondary | ICD-10-CM | POA: Diagnosis not present

## 2018-04-15 DIAGNOSIS — I4891 Unspecified atrial fibrillation: Secondary | ICD-10-CM | POA: Diagnosis not present

## 2018-04-15 HISTORY — PX: ESOPHAGOGASTRODUODENOSCOPY: SHX5428

## 2018-04-15 HISTORY — PX: BALLOON DILATION: SHX5330

## 2018-04-15 SURGERY — EGD (ESOPHAGOGASTRODUODENOSCOPY)
Anesthesia: Monitor Anesthesia Care

## 2018-04-15 MED ORDER — LACTATED RINGERS IV SOLN
INTRAVENOUS | Status: DC | PRN
  Administered 2018-04-15: 10:00:00 via INTRAVENOUS

## 2018-04-15 MED ORDER — PROPOFOL 500 MG/50ML IV EMUL
INTRAVENOUS | Status: DC | PRN
  Administered 2018-04-15: 100 ug/kg/min via INTRAVENOUS

## 2018-04-15 MED ORDER — PROPOFOL 10 MG/ML IV BOLUS
INTRAVENOUS | Status: DC | PRN
  Administered 2018-04-15 (×2): 20 mg via INTRAVENOUS

## 2018-04-15 MED ORDER — SODIUM CHLORIDE 0.9 % IV SOLN: INTRAVENOUS | Status: DC

## 2018-04-15 MED ORDER — PROPOFOL 10 MG/ML IV BOLUS
INTRAVENOUS | Status: AC
  Filled 2018-04-15: qty 60

## 2018-04-15 NOTE — Interval H&P Note (Signed)
History and Physical Interval Note: 81/female with pyloric stenosis for EGD with pyloric channel dilation.  04/15/2018 10:31 AM  Angela [name]  has presented today for EGD with dilation, with the diagnosis of Pyloric stenosis in adult  The various methods of treatment have been discussed with the patient and family. After consideration of risks, benefits and other options for treatment, the patient has consented to  Procedure(s): ESOPHAGOGASTRODUODENOSCOPY (EGD) (N/A) BALLOON DILATION (N/A) as a surgical intervention .  The patient's history has been reviewed, patient examined, no change in status, stable for surgery.  I have reviewed the patient's chart and labs.  Questions were answered to the patient's satisfaction.     [name]

## 2018-04-15 NOTE — Anesthesia Preprocedure Evaluation (Signed)
Anesthesia Evaluation  Patient identified by MRN, date of birth, ID band Patient awake    Reviewed: Allergy & Precautions, NPO status , Patient's Chart, lab work & pertinent test results  Airway Mallampati: I  TM Distance: >3 FB Neck ROM: Full    Dental   Pulmonary    Pulmonary exam normal        Cardiovascular hypertension, Pt. on medications + CAD  Normal cardiovascular exam+ dysrhythmias Atrial Fibrillation      Neuro/Psych Depression    GI/Hepatic GERD  Medicated and Controlled,  Endo/Other    Renal/GU      Musculoskeletal   Abdominal   Peds  Hematology   Anesthesia Other Findings   Reproductive/Obstetrics                             Anesthesia Physical Anesthesia Plan  ASA: III  Anesthesia Plan: MAC   Post-op Pain Management:    Induction: Intravenous  PONV Risk Score and Plan: 2 and Treatment may vary due to age or medical condition  Airway Management Planned: Simple Face Mask  Additional Equipment:   Intra-op Plan:   Post-operative Plan:   Informed Consent: I have reviewed the patients History and Physical, chart, labs and discussed the procedure including the risks, benefits and alternatives for the proposed anesthesia with the patient or authorized representative who has indicated his/her understanding and acceptance.     Plan Discussed with: CRNA and Surgeon  Anesthesia Plan Comments:         Anesthesia Quick Evaluation

## 2018-04-15 NOTE — Op Note (Signed)
[facility] Patient Name: Angela [name] Procedure Date: 04/15/2018 MRN: [id number] Attending MD: [name] , MD Date of Birth: July 03, 1936 CSN: [id number] Age: 82 Admit Type: Outpatient Procedure:                Upper GI endoscopy Indications:              For therapy of pyloric stenosis Providers:                [name], MD, Elmer Ramp. Tilden Dome, RN, Laurena Spies, Technician, [place] Alday CRNA, CRNA Referring MD:              Medicines:                Monitored Anesthesia Care Complications:            No immediate complications. Estimated blood loss:                            Minimal. Estimated Blood Loss:     Estimated blood loss was minimal. Procedure:                Pre-Anesthesia Assessment:                           - Prior to the procedure, a History and Physical                            was performed, and patient medications and                            allergies were reviewed. The patient's tolerance of                            previous anesthesia was also reviewed. The risks                            and benefits of the procedure and the sedation                            options and risks were discussed with the patient.                            All questions were answered, and informed consent                            was obtained. Prior Anticoagulants: The patient has                            taken Eliquis (apixaban), last dose was 1 day prior                            to procedure. ASA Grade Assessment: III - A patient  with severe systemic disease. After reviewing the                            risks and benefits, the patient was deemed in                            satisfactory condition to undergo the procedure.                           After obtaining informed consent, the endoscope was                            passed under direct vision. Throughout the          procedure, the patient's blood pressure, pulse, and                            oxygen saturations were monitored continuously. The                            EC-[id number] ([id number]) scope was introduced through                            the mouth, and advanced to the second part of                            duodenum. The upper GI endoscopy was accomplished                            without difficulty. The patient tolerated the                            procedure well. Scope In: Scope Out: Findings:      There were esophageal mucosal changes suspicious for short-segment       Barrett's esophagus present in the lower third of the esophagus. The       maximum longitudinal extent of these mucosal changes was 3 cm in length.      A large amount of food (residue) was found in the gastric body.      The seen portion of cardia and gastric fundus were normal on       retroflexion, although food residue obscured complete visualization.      A benign-appearing, intrinsic severe stenosis was found at the pylorus.       This was traversed. A TTS dilator was passed through the scope. Dilation       with an 18 mm pyloric balloon dilator was performed. The dilation site       was examined following endoscope reinsertion and showed moderate mucosal       disruption.      Post dilation, there was no resistance noted on passage of pediatric       colonoscope. However, pylorus itself appears deformed from prior       ulceration and significant looping was encountered even with pediatric       colonoscope. Impression:               - Esophageal mucosal changes suspicious for  short-segment Barrett's esophagus.                           - A large amount of food (residue) in the stomach.                           - Gastric stenosis was found at the pylorus.                            Dilated.                           - No specimens collected. Moderate Sedation:      Patient  did not receive moderate sedation for this procedure, but       instead received monitored anesthesia care. Recommendation:           - Patient has a contact number available for                            emergencies. The signs and symptoms of potential                            delayed complications were discussed with the                            patient. Return to normal activities tomorrow.                            Written discharge instructions were provided to the                            patient.                           - Low fiber diet and low residue diet.                           - Continue present medications.                           - Resume Eliquis (apixaban) at prior dose tomorrow.                           - Repeat upper endoscopy in 2 months for                            retreatment. Procedure Code(s):        --- Professional ---                           [phone], Esophagogastroduodenoscopy, flexible,                            transoral; with dilation of gastric/duodenal  stricture(s) (eg, balloon, bougie) Diagnosis Code(s):        --- Professional ---                           K22.8, Other specified diseases of esophagus                           K31.1, Adult hypertrophic pyloric stenosis CPT copyright 2017 American Medical Association. All rights reserved. The codes documented in this report are preliminary and upon coder review may  be revised to meet current compliance requirements. [name], MD 04/15/2018 10:55:23 AM This report has been signed electronically. Number of Addenda: 0

## 2018-04-15 NOTE — Discharge Instructions (Signed)

## 2018-04-15 NOTE — Brief Op Note (Signed)
04/15/2018  10:55 AM  PATIENT:  Angela [name]  82 y.o. female  PRE-OPERATIVE DIAGNOSIS:  Pyloric stenosis in adult  POST-OPERATIVE DIAGNOSIS:  pyloric stenosis  PROCEDURE:  Procedure(s): ESOPHAGOGASTRODUODENOSCOPY (EGD) (N/A) BALLOON DILATION (N/A)  SURGEON:  Surgeon(s) and Role:    [name], MD - Primary  PHYSICIAN ASSISTANT:   ASSISTANTS: Tory Emerald, Rn, Laurena Spies, Tech  ANESTHESIA:   MAC  EBL:  Minimal   BLOOD ADMINISTERED:none  DRAINS: none   LOCAL MEDICATIONS USED:  NONE  SPECIMEN:  No Specimen  DISPOSITION OF SPECIMEN:  N/A  COUNTS:  YES  TOURNIQUET:  * No tourniquets in log *  DICTATION: .Dragon Dictation  PLAN OF CARE: Discharge to home after PACU  PATIENT DISPOSITION:  PACU - hemodynamically stable.   Delay start of Pharmacological VTE agent (>24hrs) due to surgical blood loss or risk of bleeding: yes

## 2018-04-15 NOTE — Anesthesia Postprocedure Evaluation (Signed)
Anesthesia Post Note  Patient: Angela [name]  Procedure(s) Performed: ESOPHAGOGASTRODUODENOSCOPY (EGD) (N/A ) BALLOON DILATION (N/A )     Patient location during evaluation: PACU Anesthesia Type: MAC Level of consciousness: awake and alert Pain management: pain level controlled Vital Signs Assessment: post-procedure vital signs reviewed and stable Respiratory status: spontaneous breathing, nonlabored ventilation, respiratory function stable and patient connected to nasal cannula oxygen Cardiovascular status: stable and blood pressure returned to baseline Postop Assessment: no apparent nausea or vomiting Anesthetic complications: no    Last Vitals:  Vitals:   04/15/18 1120 04/15/18 1125  BP: 91/65 98/65  Pulse: 87 83  Resp: 19 20  Temp:    SpO2: 99% 94%    Last Pain:  Vitals:   04/15/18 1100  TempSrc:   PainSc: 0-No pain                 Rashee Marschall DAVID

## 2018-04-15 NOTE — Transfer of Care (Signed)
Immediate Anesthesia Transfer of Care Note  Patient: Angela [name]  Procedure(s) Performed: ESOPHAGOGASTRODUODENOSCOPY (EGD) (N/A ) BALLOON DILATION (N/A )  Patient Location: PACU  Anesthesia Type:MAC  Level of Consciousness: sedated  Airway & Oxygen Therapy: Patient Spontanous Breathing and Patient connected to nasal cannula oxygen  Post-op Assessment: Report given to RN and Post -op Vital signs reviewed and stable  Post vital signs: Reviewed and stable  Last Vitals:  Vitals Value Taken Time  BP    Temp    Pulse    Resp    SpO2      Last Pain:  Vitals:   04/15/18 1007  TempSrc: Oral  PainSc: 0-No pain         Complications: No apparent anesthesia complications

## 2018-04-18 ENCOUNTER — Encounter (HOSPITAL_COMMUNITY): Payer: Self-pay | Admitting: Gastroenterology

## 2018-04-24 ENCOUNTER — Telehealth: Payer: Self-pay | Admitting: *Deleted

## 2018-04-24 NOTE — Telephone Encounter (Signed)
Received notification of possible therapeutic duplication from pt insurance.  Advised their records indicated pt taking Acebutolol and Toprol. Advised pt to stop Toprol, per Dr. Curt Bears.  Informed that we would discuss this more at her appt on Tuesday. Pt will call office if she has any issues before then. Patient verbalized understanding and agreeable to plan.

## 2018-04-29 ENCOUNTER — Other Ambulatory Visit: Payer: Self-pay

## 2018-04-29 ENCOUNTER — Encounter: Payer: Self-pay | Admitting: Cardiology

## 2018-04-29 ENCOUNTER — Ambulatory Visit (INDEPENDENT_AMBULATORY_CARE_PROVIDER_SITE_OTHER): Payer: Medicare Other | Admitting: Cardiology

## 2018-04-29 VITALS — BP 100/66 | HR 90 | Ht 61.0 in | Wt 105.2 lb

## 2018-04-29 DIAGNOSIS — I2510 Atherosclerotic heart disease of native coronary artery without angina pectoris: Secondary | ICD-10-CM | POA: Diagnosis not present

## 2018-04-29 DIAGNOSIS — Z01812 Encounter for preprocedural laboratory examination: Secondary | ICD-10-CM | POA: Diagnosis not present

## 2018-04-29 DIAGNOSIS — I428 Other cardiomyopathies: Secondary | ICD-10-CM

## 2018-04-29 DIAGNOSIS — I255 Ischemic cardiomyopathy: Secondary | ICD-10-CM

## 2018-04-29 DIAGNOSIS — I4819 Other persistent atrial fibrillation: Secondary | ICD-10-CM

## 2018-04-29 DIAGNOSIS — I481 Persistent atrial fibrillation: Secondary | ICD-10-CM

## 2018-04-29 MED ORDER — AMIODARONE HCL 200 MG PO TABS: 200.0000 mg | ORAL_TABLET | Freq: Two times a day (BID) | ORAL | 1 refills | Status: DC

## 2018-04-29 MED ORDER — AMIODARONE HCL 200 MG PO TABS: 200.0000 mg | ORAL_TABLET | Freq: Every day | ORAL | 1 refills | Status: DC

## 2018-04-29 NOTE — Patient Instructions (Addendum)
Medication Instructions:  Your physician has recommended you make the following change in your medication:  1. INCREASE Amiodarone to 100 mg TWICE daily  Labwork: BMET & CBC today     *We will only notify you of abnormal results, otherwise continue current treatment plan.  Testing/Procedures: Your physician has recommended that you have a Cardioversion (DCCV). Electrical Cardioversion uses a jolt of electricity to your heart either through paddles or wired patches attached to your chest. This is a controlled, usually prescheduled, procedure. Defibrillation is done under light anesthesia in the hospital, and you usually go home the day of the procedure. This is done to get your heart back into a normal rhythm. You are not awake for the procedure.  CARDIOVERSION INSTRUCTIONS: Please arrive at the [facility] main entrance of [facility] hospital on 05/09/2018 at  9:00 a.m. Do not eat or drink after midnight prior to procedure Do not take your Lasix the morning of this procedure. Take your remaining medications as normal the morning of your procedure with a sip of water. You will need someone to drive you home at discharge   Follow-Up: Your physician recommends that you schedule a follow-up appointment in: end of July/beginning of August, in the [facility] clinic.  Your physician wants you to follow-up in: 6 months with Dr. Curt Bears.  You will receive a reminder letter in the mail two months in advance. If you don't receive a letter, please call our office to schedule the follow-up appointment.   * If you need a refill on your cardiac medications before your next appointment, please call your pharmacy.   *Please note that any paperwork needing to be filled out by the provider will need to be addressed at the front desk prior to seeing the provider. Please note that any FMLA, disability or other documents regarding health condition is subject to a $25.00 charge that must be received prior to completion  of paperwork in the form of a money order or check.  Thank you for choosing [facility]!!   Trinidad Curet, RN [phone]  Any Other Special Instructions Will Be Listed Below (If Applicable).   Electrical Cardioversion Electrical cardioversion is the delivery of a jolt of electricity to restore a normal rhythm to the heart. A rhythm that is too fast or is not regular keeps the heart from pumping well. In this procedure, sticky patches or metal paddles are placed on the chest to deliver electricity to the heart from a device. This procedure may be done in an emergency if:  There is low or no blood pressure as a result of the heart rhythm.  Normal rhythm must be restored as fast as possible to protect the brain and heart from further damage.  It may save a life.  This procedure may also be done for irregular or fast heart rhythms that are not immediately life-threatening. Tell a health care provider about:  Any allergies you have.  All medicines you are taking, including vitamins, herbs, eye drops, creams, and over-the-counter medicines.  Any problems you or family members have had with anesthetic medicines.  Any blood disorders you have.  Any surgeries you have had.  Any medical conditions you have.  Whether you are pregnant or may be pregnant. What are the risks? Generally, this is a safe procedure. However, problems may occur, including:  Allergic reactions to medicines.  A blood clot that breaks free and travels to other parts of your body.  The possible return of an abnormal  heart rhythm within hours or days after the procedure.  Your heart stopping (cardiac arrest). This is rare.  What happens before the procedure? Medicines  Your health care provider may have you start taking: ? Blood-thinning medicines (anticoagulants) so your blood does not clot as easily. ? Medicines may be given to help stabilize your heart rate and rhythm.  Ask your health care  provider about changing or stopping your regular medicines. This is especially important if you are taking diabetes medicines or blood thinners. General instructions  Plan to have someone take you home from the hospital or clinic.  If you will be going home right after the procedure, plan to have someone with you for 24 hours.  Follow instructions from your health care provider about eating or drinking restrictions. What happens during the procedure?  To lower your risk of infection: ? Your health care team will wash or sanitize their hands. ? Your skin will be washed with soap.  An IV tube will be inserted into one of your veins.  You will be given a medicine to help you relax (sedative).  Sticky patches (electrodes) or metal paddles may be placed on your chest.  An electrical shock will be delivered. The procedure may vary among health care providers and hospitals. What happens after the procedure?  Your blood pressure, heart rate, breathing rate, and blood oxygen level will be monitored until the medicines you were given have worn off.  Do not drive for 24 hours if you were given a sedative.  Your heart rhythm will be watched to make sure it does not change. This information is not intended to replace advice given to you by your health care provider. Make sure you discuss any questions you have with your health care provider. Document Released: 10/12/2002 Document Revised: 06/20/2016 Document Reviewed: 04/27/2016 Elsevier Interactive Patient Education  2017 [employer].

## 2018-04-29 NOTE — Addendum Note (Signed)
Addended by: Stanton Kidney on: 04/29/2018 02:33 PM   Modules accepted: Orders

## 2018-04-29 NOTE — Progress Notes (Addendum)
Electrophysiology Office Note   Date:  04/29/2018   ID:  Angela [name], DOB [birth year], MRN [id number]  PCP:  [name], MD  Cardiologist:  Odetta Pink Primary Electrophysiologist:  Constance Haw, MD    Chief Complaint  Patient presents with  . Atrial Fibrillation     History of Present Illness: Angela [name] is a 82 y.o. female who is being seen today for the evaluation of atrial flutter at the request of [place]. Presenting today for electrophysiology evaluation.  She has a history of coronary disease status post LAD PCI, hyperlipidemia, hypertension, PVCs, report arthritis, aortic stenosis, paroxysmal atrial fibrillation/flutter, and chronic cardiomyopathy.  She was hospitalized 01/2016 with gastric outlet obstruction.  She had a polypoid mass that was negative for malignancy.  Her hospital course was complicated by atrial flutter.  She was put on Eliquis at that time.  Her ejection fraction was noted to be 45%.  She underwent cath that showed nonobstructive coronary disease.  She was noted to be in atrial fibrillation 02/2018.  She underwent successful cardioversion 03/13/2018.  She had a brief episode of being out of rhythm on 03/17/2018.  She is currently on amiodarone, though she does have symptoms of nausea and dizziness.  Her symptoms with atrial flutter are weakness and fatigue.  Today, denies symptoms of palpitations, chest pain, shortness of breath, orthopnea, PND, lower extremity edema, claudication, dizziness, presyncope, syncope, bleeding, or neurologic sequela. The patient is tolerating medications without difficulties.  Overall she is feeling weak and fatigued.  She is having difficulty swallowing things.  She recently had an esophageal dilation.   Past Medical History:  Diagnosis Date  . A-fib ([place])   . Acute renal failure ([place]) 06/15/2013  . Aortic stenosis    mild by echo 03/2012  . Asthma   . Atrial fibrillation ([place])   . Atrial flutter ([place]) 01/16/2016   . CAD (coronary artery disease)    s/p PCI with BMS to mid LAD--2/08 not on aspirin due to frequent ulcers.  Repeat cath for CP 09/2012 with patent LAD stent and otherwise normal coronary arteries  . Cardiomyopathy ([place]) 01/30/2016  . Cervical stenosis of spine   . Chronic pain syndrome 12/02/2017  . Depression   . Duodenitis 01/16/2016  . Dyslipidemia   . Elbow fracture, right    history of- never any surgery  . Essential hypertension, benign 11/03/2013  . Gastric outlet obstruction 01/06/2016  . Gastroparesis 01/06/2016  . GERD (gastroesophageal reflux disease)   . Headache   . Hypertension   . Hyponatremia 06/15/2013  . Impaired physical mobility    due to rheumatoid arthritis "ambulates with walker" limited free standing.  . Intestinal polyp   . Macular degeneration   . Obesity   . Osteoporosis    alendronate since 2012  . PUD (peptic ulcer disease)    can not take aspirin  . PVC's (premature ventricular contractions)   . RA (rheumatoid arthritis) ([place])   . Rheumatoid arthritis(714.0)    Dr. Lenna Gilford follows  . Spondylolisthesis of lumbar region   . SVT (supraventricular tachycardia) ([place]) 01/16/2016   Past Surgical History:  Procedure Laterality Date  . ANKLE FUSION Bilateral    x2 both  . BALLOON DILATION N/A 01/10/2018   Procedure: BALLOON DILATION;  Surgeon: [name], MD;  Location: [place];  Service: Gastroenterology;  Laterality: N/A;  . BALLOON DILATION N/A 01/24/2018   Procedure: BALLOON DILATION;  Surgeon: [name], MD;  Location: [place];  Service:  Gastroenterology;  Laterality: N/A;  W/ Fluoroscopy. Please use colonoscope.  Marland Kitchen BALLOON DILATION N/A 04/15/2018   Procedure: BALLOON DILATION;  Surgeon: [name], MD;  Location: [facility];  Service: Gastroenterology;  Laterality: N/A;  . CARDIAC CATHETERIZATION  09/2012   patent LAD stent and otherwise normal coronary arteries  . CARDIAC CATHETERIZATION N/A 03/07/2016   Procedure: Left Heart Cath and  Coronary Angiography;  Surgeon: Peter M Martinique, MD;  Location: [place] [facility];  Service: Cardiovascular;  Laterality: N/A;  . CARDIOVERSION N/A 03/13/2018   Procedure: CARDIOVERSION;  Surgeon: Lelon Perla, MD;  Location: [facility];  Service: Cardiovascular;  Laterality: N/A;  . CATARACT EXTRACTION, BILATERAL Bilateral   . CERVICAL FUSION     limited  ROM  . CHOLECYSTECTOMY    . ESOPHAGOGASTRODUODENOSCOPY N/A 01/07/2016   Procedure: ESOPHAGOGASTRODUODENOSCOPY (EGD);  Surgeon: Wilford Corner, MD;  Location: [facility];  Service: Endoscopy;  Laterality: N/A;  . ESOPHAGOGASTRODUODENOSCOPY N/A 01/10/2018   Procedure: ESOPHAGOGASTRODUODENOSCOPY (EGD);  Surgeon: [name], MD;  Location: [place];  Service: Gastroenterology;  Laterality: N/A;  . ESOPHAGOGASTRODUODENOSCOPY N/A 01/24/2018   Procedure: ESOPHAGOGASTRODUODENOSCOPY (EGD);  Surgeon: [name], MD;  Location: [place];  Service: Gastroenterology;  Laterality: N/A;  . ESOPHAGOGASTRODUODENOSCOPY N/A 04/15/2018   Procedure: ESOPHAGOGASTRODUODENOSCOPY (EGD);  Surgeon: [name], MD;  Location: [facility];  Service: Gastroenterology;  Laterality: N/A;  . ESOPHAGOGASTRODUODENOSCOPY (EGD) WITH PROPOFOL N/A 11/01/2015   Procedure: ESOPHAGOGASTRODUODENOSCOPY (EGD) WITH PROPOFOL w/ Dialtation;  Surgeon: Garlan Fair, MD;  Location: [facility];  Service: Endoscopy;  Laterality: N/A;  . FRACTURE SURGERY  09/2011   left ankle screw and pin removed  . HAND SURGERY Bilateral    x4 digits 2-5 both hands  . JOINT REPLACEMENT Bilateral    BTKA  . TONSILLECTOMY       Current Outpatient Medications  Medication Sig Dispense Refill  . acebutolol (SECTRAL) 200 MG capsule Take 1 capsule (200 mg total) by mouth daily. 90 capsule 3  . apixaban (ELIQUIS) 5 MG TABS tablet Take 1 tablet (5 mg total) by mouth 2 (two) times daily. 180 tablet 3  . butalbital-acetaminophen-caffeine (FIORICET, ESGIC) 50-325-40 MG per tablet Take 1  tablet by mouth every 6 (six) hours as needed for headache or migraine.     . Calcium Carbonate-Vitamin D (CALCIUM 500+D PO) Take 1 tablet by mouth 2 (two) times daily.     . DENTAGEL 1.1 % GEL dental gel Place 1 application onto teeth at bedtime.     . diclofenac sodium (VOLTAREN) 1 % GEL Apply 2 grams topically at bedtime to shoulders and elbows 300 g 4  . Docusate Calcium (STOOL SOFTENER PO) Take 1 tablet by mouth daily as needed (constipation).     Marland Kitchen escitalopram (LEXAPRO) 5 MG tablet Take 5 mg by mouth in the morning 90 tablet 1  . ferrous sulfate 325 (65 FE) MG tablet Take 325 mg by mouth daily with breakfast.    . folic acid (FOLVITE) 1 MG tablet Take 1 mg by mouth daily.     . furosemide (LASIX) 40 MG tablet TAKE 1 TABLET BY MOUTH EVERY DAY TAKE 1/2 TAB AS NEEDED FOR EXTRA SWELLING 45 tablet 7  . lidocaine (LIDODERM) 5 % PLACE 3 PATCHES ONTO THE SKIN EVERY 12 (TWELVE) HOURS. APPLY NECK AND ELBOW (Patient taking differently: Place 2 patches onto the skin daily. ) 90 patch 5  . LORazepam (ATIVAN) 2 MG tablet Take 2 mg by mouth at bedtime.  5  . methotrexate (  RHEUMATREX) 2.5 MG tablet Take 15 mg by mouth every Saturday.     . nitroGLYCERIN (NITROSTAT) 0.4 MG SL tablet Place 1 tablet (0.4 mg total) under the tongue every 5 (five) minutes as needed for chest pain. 25 tablet 1  . oxyCODONE-acetaminophen (PERCOCET) 7.5-325 MG tablet Take 1 tablet by mouth every 6 (six) hours as needed for moderate pain. 120 tablet 0  . pantoprazole (PROTONIX) 40 MG tablet Take 40 mg by mouth 2 (two) times daily.     . predniSONE (DELTASONE) 5 MG tablet Take 5 mg by mouth daily with breakfast.     . PREVIDENT 0.2 % SOLN Take 1 each by mouth See admin instructions. Rinses with about 1 capful nightly    . propranolol (INDERAL) 10 MG tablet Take 1 tablet (10 mg total) by mouth as needed (for palpitations). 30 tablet 6  . rosuvastatin (CRESTOR) 10 MG tablet Take 1 tablet (10 mg total) by mouth daily. 90 tablet 3  .  TOVIAZ 8 MG TB24 tablet Take 8 mg by mouth daily.    . Turmeric 500 MG CAPS Take 500 mg by mouth daily.    . vitamin B-12 (CYANOCOBALAMIN) 1000 MCG tablet Take 1,000 mcg by mouth daily.    . vitamin C (ASCORBIC ACID) 500 MG tablet Take 500 mg by mouth daily.     Marland Kitchen amiodarone (PACERONE) 200 MG tablet Take 1 tablet (200 mg total) by mouth daily. 180 tablet 1   No current facility-administered medications for this visit.     Allergies:   Peanut-containing drug products; Reglan [metoclopramide]; Clindamycin/lincomycin; Codeine; Iohexol; Pineapple; and Shellfish allergy   Social History:  The patient  reports that she has never smoked. She has never used smokeless tobacco. She reports that she does not drink alcohol or use drugs.   Family History:  The patient's family history includes Arrhythmia in her mother; Lung cancer in her mother; Melanoma in her brother.    ROS:  Please see the history of present illness.   Otherwise, review of systems is positive for weight change, appetite change, fatigue, leg swelling.   All other systems are reviewed and negative.   PHYSICAL EXAM: VS:  BP 100/66   Pulse 90   Ht 5\' 1"  (1.549 m)   Wt 105 lb 3.2 oz (47.7 kg)   BMI 19.88 kg/m  , BMI Body mass index is 19.88 kg/m. GEN: Well nourished, well developed, in no acute distress  HEENT: normal  Neck: no JVD, carotid bruits, or masses Cardiac: RRR; no murmurs, rubs, or gallops,no edema  Respiratory:  clear to auscultation bilaterally, normal work of breathing GI: soft, nontender, nondistended, + BS MS: no deformity or atrophy  Skin: warm and dry Neuro:  Strength and sensation are intact Psych: euthymic mood, full affect  EKG:  EKG is ordered today. Personal review of the ekg ordered shows atrial flutter, rate 90  Recent Labs: 07/31/2017: TSH 2.350 03/10/2018: BUN 18; Creatinine, Ser 0.59; Hemoglobin 14.3; Platelets 190; Potassium 4.2; Sodium 135    Lipid Panel     Component Value Date/Time    CHOL 129 02/24/2015 1030   TRIG 81 01/09/2016 0515   HDL 58.80 02/24/2015 1030   CHOLHDL 2 02/24/2015 1030   VLDL 17.6 02/24/2015 1030   LDLCALC 53 02/24/2015 1030     Wt Readings from Last 3 Encounters:  04/29/18 105 lb 3.2 oz (47.7 kg)  04/15/18 107 lb 12.8 oz (48.9 kg)  04/03/18 107 lb 12.8 oz (48.9 kg)  Other studies Reviewed: Additional studies/ records that were reviewed today include: TTE 01/14/16  Review of the above records today demonstrates:  - Left ventricle: The cavity size was normal. Wall thickness was   normal. The estimated ejection fraction was 45%. Diffuse   hypokinesis. Although no diagnostic regional wall motion   abnormality was identified, this possibility cannot be completely   excluded on the basis of this study. - Aortic valve: There was moderate stenosis. Mean gradient (S): 16   mm Hg. Peak gradient (S): 29 mm Hg. VTI ratio of LVOT to aortic   valve: 0.18. - Mitral valve: Mildly thickened leaflets . There was mild   regurgitation. - Left atrium: The atrium was moderately to severely dilated. - Right atrium: Central venous pressure (est): 3 mm Hg. - Tricuspid valve: There was moderate regurgitation. - Pulmonary arteries: PA peak pressure: 34 mm Hg (S). - Pericardium, extracardiac: There was no pericardial effusion.  LHC 03/07/16  Prox LAD to Mid LAD lesion, 10% stenosed. The lesion was previously treated with a bare metal stent greater than two years ago.  The left ventricular systolic function is normal.   1. No obstructive CAD. Stent in the mid LAD is widely patent. 2. Good overall LV function. There is focal hypokinesis of the basal inferior wall that has been present since cardiac cath in 2005.   ASSESSMENT AND PLAN:  1.  Persistent atrial fibrillation/atrial flutter: He on amiodarone 100 mg due to nausea.  We Angela [name] plan to increase to 100 mg twice a day.  She is also on Eliquis.  He had an endoscopy and stopped her Eliquis 14 days ago with  a recent restart.  We Angela [name] plan for cardioversion in 10 days as this should give her 3 weeks of therapeutic Eliquis dosing.  This patients CHA2DS2-VASc Score and unadjusted Ischemic Stroke Rate (% per year) is equal to 9.7 % stroke rate/year from a score of 6  Above score calculated as 1 point each if present [CHF, HTN, DM, Vascular=MI/PAD/Aortic Plaque, Age if 65-74, or Female] Above score calculated as 2 points each if present [Age > 75, or Stroke/TIA/TE]  2.  Coronary artery disease: That is post LAD stenting.  Cath with nonobstructive disease in 2017.  No chest pain.  3.  Chronic systolic heart failure: EF 45% on echo.  Was thought to be tachycardia mediated at the time.  No changes.   Current medicines are reviewed at length with the patient today.   The patient does not have concerns regarding her medicines.  The following changes were made today: Increase amiodarone  Labs/ tests ordered today include:  Orders Placed This Encounter  Procedures  . Basic Metabolic Panel (BMET)  . CBC  . EKG 12-Lead   Disposition:   FU with Angela [name] 1.5 months  Signed, Angela Modesto Meredith Leeds, MD  04/29/2018 12:13 PM     [address] [place] [address] [phone] (office) [phone] (fax)

## 2018-04-30 ENCOUNTER — Encounter: Payer: Medicare Other | Attending: Registered Nurse | Admitting: Physical Medicine & Rehabilitation

## 2018-04-30 ENCOUNTER — Encounter: Payer: Self-pay | Admitting: Physical Medicine & Rehabilitation

## 2018-04-30 VITALS — BP 88/60 | HR 87 | Ht 61.0 in | Wt 105.0 lb

## 2018-04-30 DIAGNOSIS — Z5181 Encounter for therapeutic drug level monitoring: Secondary | ICD-10-CM

## 2018-04-30 DIAGNOSIS — M545 Low back pain: Secondary | ICD-10-CM | POA: Insufficient documentation

## 2018-04-30 DIAGNOSIS — G8929 Other chronic pain: Secondary | ICD-10-CM | POA: Insufficient documentation

## 2018-04-30 DIAGNOSIS — Z79899 Other long term (current) drug therapy: Secondary | ICD-10-CM

## 2018-04-30 DIAGNOSIS — M4316 Spondylolisthesis, lumbar region: Secondary | ICD-10-CM | POA: Insufficient documentation

## 2018-04-30 DIAGNOSIS — M05711 Rheumatoid arthritis with rheumatoid factor of right shoulder without organ or systems involvement: Secondary | ICD-10-CM | POA: Diagnosis not present

## 2018-04-30 DIAGNOSIS — F419 Anxiety disorder, unspecified: Secondary | ICD-10-CM | POA: Insufficient documentation

## 2018-04-30 DIAGNOSIS — I255 Ischemic cardiomyopathy: Secondary | ICD-10-CM

## 2018-04-30 DIAGNOSIS — M25512 Pain in left shoulder: Secondary | ICD-10-CM | POA: Diagnosis not present

## 2018-04-30 DIAGNOSIS — M25522 Pain in left elbow: Secondary | ICD-10-CM | POA: Diagnosis not present

## 2018-04-30 DIAGNOSIS — F329 Major depressive disorder, single episode, unspecified: Secondary | ICD-10-CM | POA: Diagnosis not present

## 2018-04-30 DIAGNOSIS — M4712 Other spondylosis with myelopathy, cervical region: Secondary | ICD-10-CM | POA: Diagnosis not present

## 2018-04-30 DIAGNOSIS — Z760 Encounter for issue of repeat prescription: Secondary | ICD-10-CM | POA: Insufficient documentation

## 2018-04-30 DIAGNOSIS — M069 Rheumatoid arthritis, unspecified: Secondary | ICD-10-CM | POA: Diagnosis not present

## 2018-04-30 DIAGNOSIS — M25521 Pain in right elbow: Secondary | ICD-10-CM | POA: Insufficient documentation

## 2018-04-30 DIAGNOSIS — M05712 Rheumatoid arthritis with rheumatoid factor of left shoulder without organ or systems involvement: Secondary | ICD-10-CM

## 2018-04-30 DIAGNOSIS — M25511 Pain in right shoulder: Secondary | ICD-10-CM | POA: Diagnosis not present

## 2018-04-30 DIAGNOSIS — R200 Anesthesia of skin: Secondary | ICD-10-CM | POA: Diagnosis not present

## 2018-04-30 DIAGNOSIS — R42 Dizziness and giddiness: Secondary | ICD-10-CM | POA: Insufficient documentation

## 2018-04-30 DIAGNOSIS — M4802 Spinal stenosis, cervical region: Secondary | ICD-10-CM | POA: Insufficient documentation

## 2018-04-30 LAB — CBC
HEMOGLOBIN: 13.8 g/dL (ref 11.1–15.9)
Hematocrit: 40 % (ref 34.0–46.6)
MCH: 36.9 pg — ABNORMAL HIGH (ref 26.6–33.0)
MCHC: 34.5 g/dL (ref 31.5–35.7)
MCV: 107 fL — ABNORMAL HIGH (ref 79–97)
Platelets: 157 10*3/uL (ref 150–450)
RBC: 3.74 x10E6/uL — ABNORMAL LOW (ref 3.77–5.28)
RDW: 15.1 % (ref 12.3–15.4)
WBC: 6.3 10*3/uL (ref 3.4–10.8)

## 2018-04-30 LAB — BASIC METABOLIC PANEL
BUN/Creatinine Ratio: 20 (ref 12–28)
BUN: 11 mg/dL (ref 8–27)
CALCIUM: 8.9 mg/dL (ref 8.7–10.3)
CHLORIDE: 99 mmol/L (ref 96–106)
CO2: 22 mmol/L (ref 20–29)
Creatinine, Ser: 0.56 mg/dL — ABNORMAL LOW (ref 0.57–1.00)
GFR calc Af Amer: 103 mL/min/{1.73_m2} (ref >59)
GFR calc non Af Amer: 89 mL/min/{1.73_m2} (ref >59)
Glucose: 91 mg/dL (ref 65–99)
POTASSIUM: 4 mmol/L (ref 3.5–5.2)
Sodium: 137 mmol/L (ref 134–144)

## 2018-04-30 MED ORDER — OXYCODONE-ACETAMINOPHEN 7.5-325 MG PO TABS: 1.0000 | ORAL_TABLET | Freq: Four times a day (QID) | ORAL | 0 refills | Status: AC | PRN

## 2018-04-30 NOTE — Patient Instructions (Signed)
PLEASE FEEL FREE TO CALL OUR OFFICE WITH ANY PROBLEMS OR QUESTIONS ([phone])      

## 2018-04-30 NOTE — Progress Notes (Signed)
Subjective:    Patient ID: Angela [name], female    DOB: 19-Feb-1936, 82 y.o.   MRN: [id number]  HPI   Angela [name] is here in follow-up of her chronic pain syndrome.  I last saw her in March.  About 2 weeks ago she had an EGD with pyloric dilatation to help with her weight loss.  She is hopeful to see improvement.  She has follow-up in the near future again with GI.  She is scheduled for cardioversion soon as well.   From a pain standpoint she is had ongoing shoulder discomfort.  The right shoulder in particular has bothered her with activities and with sleep at night.  She has difficulties with overhead movements.  She is requesting a shoulder injection today.  For pain she is using Percocet 7.51 to 2/day.  She tries to avoid using them when she can.    Pain Inventory Average Pain 7 Pain Right Now 6 My pain is burning, dull and aching  In the last 24 hours, has pain interfered with the following? General activity 8 Relation with others 8 Enjoyment of life 8 What TIME of day is your pain at its worst? evening Sleep (in general) Fair  Pain is worse with: walking, bending and some activites Pain improves with: rest, heat/ice, medication and injections Relief from Meds: 9  Mobility walk with assistance use a walker ability to climb steps?  no do you drive?  no  Function disabled: date disabled . I need assistance with the following:  dressing, bathing, meal prep, household duties and shopping  Neuro/Psych bladder control problems bowel control problems weakness trouble walking dizziness depression anxiety  Prior Studies Any changes since last visit?  no  Physicians involved in your care Any changes since last visit?  no   Family History  Problem Relation Age of Onset  . Lung cancer Mother   . Arrhythmia Mother   . Melanoma Brother   . Heart attack Neg Hx    Social History   Socioeconomic History  . Marital status: Widowed    Spouse name: Not on file    . Number of children: Not on file  . Years of education: Not on file  . Highest education level: Not on file  Occupational History  . Not on file  Social Needs  . Financial resource strain: Not on file  . Food insecurity:    Worry: Not on file    Inability: Not on file  . Transportation needs:    Medical: Not on file    Non-medical: Not on file  Tobacco Use  . Smoking status: Never Smoker  . Smokeless tobacco: Never Used  Substance and Sexual Activity  . Alcohol use: No  . Drug use: No  . Sexual activity: Not on file  Lifestyle  . Physical activity:    Days per week: Not on file    Minutes per session: Not on file  . Stress: Not on file  Relationships  . Social connections:    Talks on phone: Not on file    Gets together: Not on file    Attends religious service: Not on file    Active member of club or organization: Not on file    Attends meetings of clubs or organizations: Not on file    Relationship status: Not on file  Other Topics Concern  . Not on file  Social History Narrative  . Not on file   Past Surgical History:  Procedure Laterality  Date  . ANKLE FUSION Bilateral    x2 both  . BALLOON DILATION N/A 01/10/2018   Procedure: BALLOON DILATION;  Surgeon: [name], MD;  Location: [place];  Service: Gastroenterology;  Laterality: N/A;  . BALLOON DILATION N/A 01/24/2018   Procedure: Larrie Kass DILATION;  Surgeon: [name], MD;  Location: [place];  Service: Gastroenterology;  Laterality: N/A;  W/ Fluoroscopy. Please use colonoscope.  Marland Kitchen BALLOON DILATION N/A 04/15/2018   Procedure: BALLOON DILATION;  Surgeon: [name], MD;  Location: [facility];  Service: Gastroenterology;  Laterality: N/A;  . CARDIAC CATHETERIZATION  09/2012   patent LAD stent and otherwise normal coronary arteries  . CARDIAC CATHETERIZATION N/A 03/07/2016   Procedure: Left Heart Cath and Coronary Angiography;  Surgeon: Peter M Martinique, MD;  Location: [place] [facility] LAB;  Service:  Cardiovascular;  Laterality: N/A;  . CARDIOVERSION N/A 03/13/2018   Procedure: CARDIOVERSION;  Surgeon: Lelon Perla, MD;  Location: [facility] ENDOSCOPY;  Service: Cardiovascular;  Laterality: N/A;  . CATARACT EXTRACTION, BILATERAL Bilateral   . CERVICAL FUSION     limited  ROM  . CHOLECYSTECTOMY    . ESOPHAGOGASTRODUODENOSCOPY N/A 01/07/2016   Procedure: ESOPHAGOGASTRODUODENOSCOPY (EGD);  Surgeon: Wilford Corner, MD;  Location: [facility];  Service: Endoscopy;  Laterality: N/A;  . ESOPHAGOGASTRODUODENOSCOPY N/A 01/10/2018   Procedure: ESOPHAGOGASTRODUODENOSCOPY (EGD);  Surgeon: [name], MD;  Location: [place];  Service: Gastroenterology;  Laterality: N/A;  . ESOPHAGOGASTRODUODENOSCOPY N/A 01/24/2018   Procedure: ESOPHAGOGASTRODUODENOSCOPY (EGD);  Surgeon: [name], MD;  Location: [place];  Service: Gastroenterology;  Laterality: N/A;  . ESOPHAGOGASTRODUODENOSCOPY N/A 04/15/2018   Procedure: ESOPHAGOGASTRODUODENOSCOPY (EGD);  Surgeon: [name], MD;  Location: [facility];  Service: Gastroenterology;  Laterality: N/A;  . ESOPHAGOGASTRODUODENOSCOPY (EGD) WITH PROPOFOL N/A 11/01/2015   Procedure: ESOPHAGOGASTRODUODENOSCOPY (EGD) WITH PROPOFOL w/ Dialtation;  Surgeon: Garlan Fair, MD;  Location: [facility];  Service: Endoscopy;  Laterality: N/A;  . FRACTURE SURGERY  09/2011   left ankle screw and pin removed  . HAND SURGERY Bilateral    x4 digits 2-5 both hands  . JOINT REPLACEMENT Bilateral    BTKA  . TONSILLECTOMY     Past Medical History:  Diagnosis Date  . A-fib ([place])   . Acute renal failure ([place]) 06/15/2013  . Aortic stenosis    mild by echo 03/2012  . Asthma   . Atrial fibrillation ([place])   . Atrial flutter ([place]) 01/16/2016  . CAD (coronary artery disease)    s/p PCI with BMS to mid LAD--2/08 not on aspirin due to frequent ulcers.  Repeat cath for CP 09/2012 with patent LAD stent and otherwise normal coronary arteries  . Cardiomyopathy ([place]) 01/30/2016  .  Cervical stenosis of spine   . Chronic pain syndrome 12/02/2017  . Depression   . Duodenitis 01/16/2016  . Dyslipidemia   . Elbow fracture, right    history of- never any surgery  . Essential hypertension, benign 11/03/2013  . Gastric outlet obstruction 01/06/2016  . Gastroparesis 01/06/2016  . GERD (gastroesophageal reflux disease)   . Headache   . Hypertension   . Hyponatremia 06/15/2013  . Impaired physical mobility    due to rheumatoid arthritis "ambulates with walker" limited free standing.  . Intestinal polyp   . Macular degeneration   . Obesity   . Osteoporosis    alendronate since 2012  . PUD (peptic ulcer disease)    can not take aspirin  . PVC's (premature ventricular contractions)   . RA (rheumatoid arthritis) ([place])   .  Rheumatoid arthritis(714.0)    Dr. Lenna Gilford follows  . Spondylolisthesis of lumbar region   . SVT (supraventricular tachycardia) ([place]) 01/16/2016   There were no vitals taken for this visit.  Opioid Risk Score:   Fall Risk Score:  `1  Depression screen PHQ 2/9  Depression screen [facility] 2/9 07/04/2016 02/14/2015  Decreased Interest 0 2  Down, Depressed, Hopeless 1 2  PHQ - 2 Score 1 4  Altered sleeping - 2  Tired, decreased energy - 3  Change in appetite - 2  Feeling bad or failure about yourself  - 2  Trouble concentrating - 2  Moving slowly or fidgety/restless - 1  Suicidal thoughts - 0  PHQ-9 Score - 16  Some recent data might be hidden     Review of Systems  Constitutional: Positive for appetite change and unexpected weight change.  HENT: Negative.   Eyes: Negative.   Respiratory: Negative.   Cardiovascular:       Hypotension  Gastrointestinal: Positive for abdominal pain, constipation and nausea.  Endocrine: Negative.   Genitourinary: Negative.   Musculoskeletal: Positive for arthralgias, gait problem, joint swelling and myalgias.  Skin: Negative.   Allergic/Immunologic: Negative.   Neurological: Positive for dizziness, weakness and  light-headedness.  Hematological: Bruises/bleeds easily.  Psychiatric/Behavioral: Positive for dysphoric mood. The patient is nervous/anxious.   All other systems reviewed and are negative.      Objective:   Physical Exam General: No acute distress.  She has lost further weight HEENT: EOMI, oral membranes moist Cards: reg rate  Chest: normal effort Abdomen: Soft, NT, ND Skin: dry, intact, chronic lesions. Extremities: no edema   M/S: low back tender to palpation. Needs extra time to stand. Posture is head forward, kyphotic.Rheumatoid deformities in both hands.  Shoulders affected as well.  Humerus is subluxed forward although she still has some functional range of motion.  Shoulder does have pain with range of motion and with impingement maneuvers.   Psychiatric:Pleasant and appropriate   Assessment & Plan:  ASSESSMENT:  1. History of cervical stenosis/spondylosis with myelopathy.  2. Rheumatoid arthritis.  3. Lumbar spondylolisthesis.  4. Gait disorder related to the above, 5. Pyloric stenosis: s/p dilation    PLAN:  1. She was givenone refillprescription for Percocet 7.5/325 1 p.o. q.6  hours p.r.n., #90.          -We will continue the controlled substance monitoring program, this consists of regular clinic visits, examinations, routine drug screening, pill counts as well as use of [place] Controlled Substance Reporting System. [place] today.     -UDS  2. Continue with lidoderm and voltaren gel.  3.After informed consent and preparation of the skin with betadine and isopropyl alcohol, I injected 6mg  (1cc) of celestone and 4cc of 1% lidocaine into the right subacromial space via posterolateral approach. Additionally, aspiration was performed prior to injection. The patient tolerated well, and no complications were encountered. Afterward the area was cleaned and dressed. Post- injection instructions were provided.   4.Compression  stockings/elevation for legs    Follow up in10monthswith me or NP.86minutes of face to face patient care time were spent during this visit. All questions were encouraged and answered.

## 2018-05-06 DIAGNOSIS — E43 Unspecified severe protein-calorie malnutrition: Secondary | ICD-10-CM | POA: Diagnosis not present

## 2018-05-06 DIAGNOSIS — I2510 Atherosclerotic heart disease of native coronary artery without angina pectoris: Secondary | ICD-10-CM | POA: Diagnosis not present

## 2018-05-06 DIAGNOSIS — F325 Major depressive disorder, single episode, in full remission: Secondary | ICD-10-CM | POA: Diagnosis not present

## 2018-05-06 DIAGNOSIS — K219 Gastro-esophageal reflux disease without esophagitis: Secondary | ICD-10-CM | POA: Diagnosis not present

## 2018-05-06 DIAGNOSIS — Z0000 Encounter for general adult medical examination without abnormal findings: Secondary | ICD-10-CM | POA: Diagnosis not present

## 2018-05-06 DIAGNOSIS — M069 Rheumatoid arthritis, unspecified: Secondary | ICD-10-CM | POA: Diagnosis not present

## 2018-05-06 DIAGNOSIS — Z1389 Encounter for screening for other disorder: Secondary | ICD-10-CM | POA: Diagnosis not present

## 2018-05-06 DIAGNOSIS — R609 Edema, unspecified: Secondary | ICD-10-CM | POA: Diagnosis not present

## 2018-05-06 DIAGNOSIS — I10 Essential (primary) hypertension: Secondary | ICD-10-CM | POA: Diagnosis not present

## 2018-05-06 DIAGNOSIS — M810 Age-related osteoporosis without current pathological fracture: Secondary | ICD-10-CM | POA: Diagnosis not present

## 2018-05-06 DIAGNOSIS — F329 Major depressive disorder, single episode, unspecified: Secondary | ICD-10-CM | POA: Diagnosis not present

## 2018-05-06 DIAGNOSIS — I482 Chronic atrial fibrillation: Secondary | ICD-10-CM | POA: Diagnosis not present

## 2018-05-06 DIAGNOSIS — E7800 Pure hypercholesterolemia, unspecified: Secondary | ICD-10-CM | POA: Diagnosis not present

## 2018-05-06 DIAGNOSIS — K3184 Gastroparesis: Secondary | ICD-10-CM | POA: Diagnosis not present

## 2018-05-07 ENCOUNTER — Telehealth: Payer: Self-pay | Admitting: *Deleted

## 2018-05-07 LAB — TOXASSURE SELECT,+ANTIDEPR,UR

## 2018-05-07 NOTE — Telephone Encounter (Signed)
Urine drug screen for this encounter is consistent for prescribed medication. Last dose of oxycodone reported 3-4 days ago so absence  Is expected.

## 2018-05-09 ENCOUNTER — Ambulatory Visit (HOSPITAL_COMMUNITY): Payer: Medicare Other | Admitting: Anesthesiology

## 2018-05-09 ENCOUNTER — Encounter (HOSPITAL_COMMUNITY): Payer: Self-pay | Admitting: *Deleted

## 2018-05-09 ENCOUNTER — Ambulatory Visit (HOSPITAL_COMMUNITY)
Admission: RE | Admit: 2018-05-09 | Discharge: 2018-05-09 | Disposition: A | Discharge status: Home / Self Care | Payer: Medicare Other | Source: Ambulatory Visit | Attending: Internal Medicine | Admitting: Internal Medicine

## 2018-05-09 ENCOUNTER — Encounter (HOSPITAL_COMMUNITY)
Admission: RE | Disposition: A | Discharge status: Home / Self Care | Payer: Self-pay | Source: Ambulatory Visit | Attending: Internal Medicine

## 2018-05-09 DIAGNOSIS — I4892 Unspecified atrial flutter: Secondary | ICD-10-CM | POA: Diagnosis not present

## 2018-05-09 DIAGNOSIS — I481 Persistent atrial fibrillation: Secondary | ICD-10-CM | POA: Diagnosis not present

## 2018-05-09 DIAGNOSIS — M810 Age-related osteoporosis without current pathological fracture: Secondary | ICD-10-CM | POA: Diagnosis not present

## 2018-05-09 DIAGNOSIS — G894 Chronic pain syndrome: Secondary | ICD-10-CM | POA: Insufficient documentation

## 2018-05-09 DIAGNOSIS — M069 Rheumatoid arthritis, unspecified: Secondary | ICD-10-CM | POA: Diagnosis not present

## 2018-05-09 DIAGNOSIS — K3184 Gastroparesis: Secondary | ICD-10-CM | POA: Insufficient documentation

## 2018-05-09 DIAGNOSIS — F329 Major depressive disorder, single episode, unspecified: Secondary | ICD-10-CM | POA: Diagnosis not present

## 2018-05-09 DIAGNOSIS — I2510 Atherosclerotic heart disease of native coronary artery without angina pectoris: Secondary | ICD-10-CM | POA: Insufficient documentation

## 2018-05-09 DIAGNOSIS — K219 Gastro-esophageal reflux disease without esophagitis: Secondary | ICD-10-CM | POA: Insufficient documentation

## 2018-05-09 DIAGNOSIS — J45909 Unspecified asthma, uncomplicated: Secondary | ICD-10-CM | POA: Insufficient documentation

## 2018-05-09 DIAGNOSIS — I5032 Chronic diastolic (congestive) heart failure: Secondary | ICD-10-CM | POA: Insufficient documentation

## 2018-05-09 DIAGNOSIS — Z955 Presence of coronary angioplasty implant and graft: Secondary | ICD-10-CM | POA: Insufficient documentation

## 2018-05-09 DIAGNOSIS — I110 Hypertensive heart disease with heart failure: Secondary | ICD-10-CM | POA: Diagnosis not present

## 2018-05-09 DIAGNOSIS — Z7901 Long term (current) use of anticoagulants: Secondary | ICD-10-CM | POA: Diagnosis not present

## 2018-05-09 DIAGNOSIS — Z7952 Long term (current) use of systemic steroids: Secondary | ICD-10-CM | POA: Diagnosis not present

## 2018-05-09 DIAGNOSIS — I10 Essential (primary) hypertension: Secondary | ICD-10-CM | POA: Diagnosis not present

## 2018-05-09 DIAGNOSIS — E785 Hyperlipidemia, unspecified: Secondary | ICD-10-CM | POA: Insufficient documentation

## 2018-05-09 DIAGNOSIS — I480 Paroxysmal atrial fibrillation: Secondary | ICD-10-CM | POA: Diagnosis not present

## 2018-05-09 HISTORY — PX: CARDIOVERSION: SHX1299

## 2018-05-09 LAB — POCT I-STAT 4, (NA,K, GLUC, HGB,HCT)
GLUCOSE: 109 mg/dL — AB (ref 70–99)
HCT: 34 % — ABNORMAL LOW (ref 36.0–46.0)
Hemoglobin: 11.6 g/dL — ABNORMAL LOW (ref 12.0–15.0)
POTASSIUM: 3.3 mmol/L — AB (ref 3.5–5.1)
SODIUM: 137 mmol/L (ref 135–145)

## 2018-05-09 SURGERY — CARDIOVERSION
Anesthesia: General

## 2018-05-09 MED ORDER — PHENYLEPHRINE 40 MCG/ML (10ML) SYRINGE FOR IV PUSH (FOR BLOOD PRESSURE SUPPORT)
PREFILLED_SYRINGE | INTRAVENOUS | Status: DC | PRN
  Administered 2018-05-09: 80 ug via INTRAVENOUS

## 2018-05-09 MED ORDER — PROPOFOL 10 MG/ML IV BOLUS
INTRAVENOUS | Status: DC | PRN
  Administered 2018-05-09: 70 mg via INTRAVENOUS

## 2018-05-09 MED ORDER — LIDOCAINE 2% (20 MG/ML) 5 ML SYRINGE
INTRAMUSCULAR | Status: DC | PRN
  Administered 2018-05-09: 50 mg via INTRAVENOUS

## 2018-05-09 MED ORDER — SODIUM CHLORIDE 0.9 % IV SOLN
INTRAVENOUS | Status: DC | PRN
  Administered 2018-05-09: 10:00:00 via INTRAVENOUS

## 2018-05-09 MED ORDER — APIXABAN 5 MG PO TABS
5.0000 mg | ORAL_TABLET | Freq: Once | ORAL | Status: AC
  Administered 2018-05-09: 5 mg via ORAL
  Filled 2018-05-09: qty 1

## 2018-05-09 NOTE — Anesthesia Preprocedure Evaluation (Addendum)
Anesthesia Evaluation  Patient identified by MRN, date of birth, ID band Patient awake    Reviewed: Allergy & Precautions, NPO status , Patient's Chart, lab work & pertinent test results  Airway Mallampati: II  TM Distance: >3 FB Neck ROM: Full    Dental no notable dental hx.    Pulmonary neg pulmonary ROS,    Pulmonary exam normal breath sounds clear to auscultation       Cardiovascular hypertension, + CAD and + Cardiac Stents  Normal cardiovascular exam+ Valvular Problems/Murmurs AS  Rhythm:Irregular Rate:Normal  ECG: A-flutter, rate 90   Neuro/Psych  Headaches, PSYCHIATRIC DISORDERS Depression    GI/Hepatic Neg liver ROS, PUD, GERD  Medicated and Controlled,  Endo/Other  negative endocrine ROS  Renal/GU negative Renal ROS     Musculoskeletal  (+) Arthritis , Rheumatoid disorders,    Abdominal   Peds  Hematology HLD   Anesthesia Other Findings afib  Reproductive/Obstetrics                            Anesthesia Physical Anesthesia Plan  ASA: III  Anesthesia Plan: General   Post-op Pain Management:    Induction: Intravenous  PONV Risk Score and Plan: 3 and Propofol infusion and Treatment may vary due to age or medical condition  Airway Management Planned: Mask  Additional Equipment:   Intra-op Plan:   Post-operative Plan:   Informed Consent: I have reviewed the patients History and Physical, chart, labs and discussed the procedure including the risks, benefits and alternatives for the proposed anesthesia with the patient or authorized representative who has indicated his/her understanding and acceptance.   Dental advisory given  Plan Discussed with: CRNA  Anesthesia Plan Comments:        Anesthesia Quick Evaluation

## 2018-05-09 NOTE — Anesthesia Procedure Notes (Signed)
Procedure Name: MAC Date/Time: 05/09/2018 10:19 AM Performed by: Renato Shin, CRNA Pre-anesthesia Checklist: Patient identified, Emergency Drugs available, Suction available and Patient being monitored Patient Re-evaluated:Patient Re-evaluated prior to induction Oxygen Delivery Method: Nasal cannula Preoxygenation: Pre-oxygenation with 100% oxygen Induction Type: IV induction Placement Confirmation: positive ETCO2,  CO2 detector and breath sounds checked- equal and bilateral Dental Injury: Teeth and Oropharynx as per pre-operative assessment

## 2018-05-09 NOTE — CV Procedure (Signed)
   CARDIOVERSION NOTE  Procedure: Electrical Cardioversion Indications:  Atrial Fibrillation  Procedure Details:  Consent: Risks of procedure as well as the alternatives and risks of each were explained to the (patient/caregiver).  Consent for procedure obtained.  Time Out: Verified patient identification, verified procedure, site/side was marked, verified correct patient position, special equipment/implants available, medications/allergies/relevent history reviewed, required imaging and test results available.  Performed  Patient placed on cardiac monitor, pulse oximetry, supplemental oxygen as necessary.  Sedation given: propofol per anesthesia Pacer pads placed anterior and posterior chest.  Cardioverted 1 time(s).  Cardioverted at 120J biphasic.  Impression: Findings: Post procedure EKG shows: NSR Complications: None Patient did tolerate procedure well.  Plan: 1. Successful DCCV with a single 120J biphasic shock.  Time Spent Directly with the Patient:  30 minutes   Pixie Casino, MD, [facility], [place] Director of the Advanced Lipid Disorders &  Cardiovascular Risk Reduction Clinic Diplomate of the American Board of Clinical Lipidology Attending Cardiologist  Direct Dial: [phone]  Fax: [phone]  Website:  [email or web address] 05/09/2018, 10:29 AM

## 2018-05-09 NOTE — Anesthesia Postprocedure Evaluation (Signed)
Anesthesia Post Note  Patient: Angela [name]  Procedure(s) Performed: CARDIOVERSION (N/A )     Patient location during evaluation: PACU Anesthesia Type: General Level of consciousness: awake Pain management: pain level controlled Vital Signs Assessment: post-procedure vital signs reviewed and stable Respiratory status: spontaneous breathing, nonlabored ventilation, respiratory function stable and patient connected to nasal cannula oxygen Cardiovascular status: blood pressure returned to baseline and stable Postop Assessment: no apparent nausea or vomiting Anesthetic complications: no    Last Vitals:  Vitals:   05/09/18 1040 05/09/18 1045  BP: (!) 75/41 (!) 91/42  Pulse: (!) 57 (!) 54  Resp: 19 16  Temp:    SpO2: 99% 98%    Last Pain:  Vitals:   05/09/18 1040  TempSrc:   PainSc: 0-No pain                 Ryan P Ellender

## 2018-05-09 NOTE — H&P (Signed)
   INTERVAL PROCEDURE H&P  History and Physical Interval Note:  05/09/2018 10:00 AM  Angela [name] has presented today for their planned procedure. The various methods of treatment have been discussed with the patient and family. After consideration of risks, benefits and other options for treatment, the patient has consented to the procedure.  The patients' outpatient history has been reviewed, patient examined, and no change in status from most recent office note within the past 30 days. I have reviewed the patients' chart and labs and will proceed as planned. Questions were answered to the patient's satisfaction.   Pixie Casino, MD, [facility], [place] Director of the Advanced Lipid Disorders &  Cardiovascular Risk Reduction Clinic Diplomate of the American Board of Clinical Lipidology Attending Cardiologist  Direct Dial: [phone]  Fax: [phone]  Website:  [email or web address]  Nadean Corwin Hilty 05/09/2018, 10:00 AM

## 2018-05-09 NOTE — Discharge Instructions (Signed)
Electrical Cardioversion, Care After °This sheet gives you information about how to care for yourself after your procedure. Your health care provider may also give you more specific instructions. If you have problems or questions, contact your health care provider. °What can I expect after the procedure? °After the procedure, it is common to have: °· Some redness on the skin where the shocks were given. ° °Follow these instructions at home: °· Do not drive for 24 hours if you were given a medicine to help you relax (sedative). °· Take over-the-counter and prescription medicines only as told by your health care provider. °· Ask your health care provider how to check your pulse. Check it often. °· Rest for 48 hours after the procedure or as told by your health care provider. °· Avoid or limit your caffeine use as told by your health care provider. °Contact a health care provider if: °· You feel like your heart is beating too quickly or your pulse is not regular. °· You have a serious muscle cramp that does not go away. °Get help right away if: °· You have discomfort in your chest. °· You are dizzy or you feel faint. °· You have trouble breathing or you are short of breath. °· Your speech is slurred. °· You have trouble moving an arm or leg on one side of your body. °· Your fingers or toes turn cold or blue. °This information is not intended to replace advice given to you by your health care provider. Make sure you discuss any questions you have with your health care provider. °Document Released: 08/12/2013 Document Revised: 05/25/2016 Document Reviewed: 04/27/2016 °Elsevier Interactive Patient Education © 2018 Elsevier Inc. ° °

## 2018-05-09 NOTE — Transfer of Care (Signed)
Immediate Anesthesia Transfer of Care Note  Patient: Angela [name]  Procedure(s) Performed: CARDIOVERSION (N/A )  Patient Location: Endoscopy Unit  Anesthesia Type:General  Level of Consciousness: awake, alert , oriented and patient cooperative  Airway & Oxygen Therapy: Patient Spontanous Breathing and Patient connected to nasal cannula oxygen  Post-op Assessment: Report given to RN and Post -op Vital signs reviewed and stable  Post vital signs: Reviewed and stable  Last Vitals:  Vitals Value Taken Time  BP    Temp    Pulse    Resp    SpO2      Last Pain:  Vitals:   05/09/18 0930  TempSrc: Oral  PainSc: 0-No pain         Complications: No apparent anesthesia complications

## 2018-05-13 ENCOUNTER — Telehealth: Payer: Self-pay | Admitting: Cardiovascular Disease

## 2018-05-13 DIAGNOSIS — F325 Major depressive disorder, single episode, in full remission: Secondary | ICD-10-CM | POA: Diagnosis not present

## 2018-05-13 DIAGNOSIS — I2510 Atherosclerotic heart disease of native coronary artery without angina pectoris: Secondary | ICD-10-CM | POA: Diagnosis not present

## 2018-05-13 DIAGNOSIS — M810 Age-related osteoporosis without current pathological fracture: Secondary | ICD-10-CM | POA: Diagnosis not present

## 2018-05-13 DIAGNOSIS — Z79891 Long term (current) use of opiate analgesic: Secondary | ICD-10-CM | POA: Diagnosis not present

## 2018-05-13 DIAGNOSIS — E43 Unspecified severe protein-calorie malnutrition: Secondary | ICD-10-CM | POA: Diagnosis not present

## 2018-05-13 DIAGNOSIS — E7800 Pure hypercholesterolemia, unspecified: Secondary | ICD-10-CM | POA: Diagnosis not present

## 2018-05-13 DIAGNOSIS — I10 Essential (primary) hypertension: Secondary | ICD-10-CM | POA: Diagnosis not present

## 2018-05-13 DIAGNOSIS — K219 Gastro-esophageal reflux disease without esophagitis: Secondary | ICD-10-CM | POA: Diagnosis not present

## 2018-05-13 DIAGNOSIS — M069 Rheumatoid arthritis, unspecified: Secondary | ICD-10-CM | POA: Diagnosis not present

## 2018-05-13 DIAGNOSIS — I482 Chronic atrial fibrillation: Secondary | ICD-10-CM | POA: Diagnosis not present

## 2018-05-13 DIAGNOSIS — K3184 Gastroparesis: Secondary | ICD-10-CM | POA: Diagnosis not present

## 2018-05-13 DIAGNOSIS — Z7901 Long term (current) use of anticoagulants: Secondary | ICD-10-CM | POA: Diagnosis not present

## 2018-05-13 DIAGNOSIS — R296 Repeated falls: Secondary | ICD-10-CM | POA: Diagnosis not present

## 2018-05-13 NOTE — Telephone Encounter (Signed)
Crysta-[facility] calling   Need clarification mg and how the pt should be taking amiodarone. Please call to clarify

## 2018-05-13 NOTE — Telephone Encounter (Signed)
I spoke with Crysta at [facility]. She wanted clarification regarding the correct instructions for amiodarone. Per Venida Jarvis, RN pt should be taking 100 mg (0.5 tablet) two times a day for a total of 200 mg daily. She states that is how patient has been taking meds, and will continue until her f/u appt with Dr. Acie Fredrickson on 05/23/18. She stated understanding and thankful for the call.

## 2018-05-14 ENCOUNTER — Emergency Department (HOSPITAL_COMMUNITY)
Admission: EM | Admit: 2018-05-14 | Discharge: 2018-05-15 | Disposition: A | Discharge status: Home / Self Care | Payer: Medicare Other | Attending: Emergency Medicine | Admitting: Emergency Medicine

## 2018-05-14 ENCOUNTER — Telehealth: Payer: Self-pay | Admitting: Cardiology

## 2018-05-14 ENCOUNTER — Encounter (HOSPITAL_COMMUNITY): Payer: Self-pay

## 2018-05-14 ENCOUNTER — Other Ambulatory Visit: Payer: Self-pay

## 2018-05-14 ENCOUNTER — Emergency Department (HOSPITAL_COMMUNITY): Payer: Medicare Other

## 2018-05-14 DIAGNOSIS — Z7901 Long term (current) use of anticoagulants: Secondary | ICD-10-CM | POA: Insufficient documentation

## 2018-05-14 DIAGNOSIS — R531 Weakness: Secondary | ICD-10-CM | POA: Diagnosis not present

## 2018-05-14 DIAGNOSIS — Z91010 Allergy to peanuts: Secondary | ICD-10-CM | POA: Diagnosis not present

## 2018-05-14 DIAGNOSIS — E860 Dehydration: Secondary | ICD-10-CM | POA: Diagnosis not present

## 2018-05-14 DIAGNOSIS — R110 Nausea: Secondary | ICD-10-CM | POA: Diagnosis present

## 2018-05-14 DIAGNOSIS — J45909 Unspecified asthma, uncomplicated: Secondary | ICD-10-CM | POA: Insufficient documentation

## 2018-05-14 DIAGNOSIS — Q400 Congenital hypertrophic pyloric stenosis: Secondary | ICD-10-CM | POA: Diagnosis not present

## 2018-05-14 DIAGNOSIS — Z79899 Other long term (current) drug therapy: Secondary | ICD-10-CM | POA: Diagnosis not present

## 2018-05-14 DIAGNOSIS — I10 Essential (primary) hypertension: Secondary | ICD-10-CM | POA: Insufficient documentation

## 2018-05-14 DIAGNOSIS — I2510 Atherosclerotic heart disease of native coronary artery without angina pectoris: Secondary | ICD-10-CM | POA: Insufficient documentation

## 2018-05-14 DIAGNOSIS — K311 Adult hypertrophic pyloric stenosis: Secondary | ICD-10-CM | POA: Insufficient documentation

## 2018-05-14 LAB — COMPREHENSIVE METABOLIC PANEL
ALT: 19 U/L (ref 0–44)
AST: 22 U/L (ref 15–41)
Albumin: 3.7 g/dL (ref 3.5–5.0)
Alkaline Phosphatase: 31 U/L — ABNORMAL LOW (ref 38–126)
Anion gap: 11 (ref 5–15)
BUN: 12 mg/dL (ref 8–23)
CO2: 23 mmol/L (ref 22–32)
CREATININE: 0.46 mg/dL (ref 0.44–1.00)
Calcium: 8.9 mg/dL (ref 8.9–10.3)
Chloride: 99 mmol/L (ref 98–111)
GFR calc Af Amer: >60 mL/min (ref >60)
GFR calc non Af Amer: >60 mL/min (ref >60)
Glucose, Bld: 99 mg/dL (ref 70–99)
Potassium: 3.7 mmol/L (ref 3.5–5.1)
SODIUM: 133 mmol/L — AB (ref 135–145)
Total Bilirubin: 0.6 mg/dL (ref 0.3–1.2)
Total Protein: 5.8 g/dL — ABNORMAL LOW (ref 6.5–8.1)

## 2018-05-14 LAB — URINALYSIS, ROUTINE W REFLEX MICROSCOPIC
Bacteria, UA: NONE SEEN
Bilirubin Urine: NEGATIVE
Glucose, UA: NEGATIVE mg/dL
Hgb urine dipstick: NEGATIVE
Ketones, ur: 5 mg/dL — AB
Leukocytes, UA: NEGATIVE
Nitrite: NEGATIVE
PH: 6 (ref 5.0–8.0)
PROTEIN: NEGATIVE mg/dL
Specific Gravity, Urine: 1.01 (ref 1.005–1.030)

## 2018-05-14 LAB — CBC
HCT: 36.8 % (ref 36.0–46.0)
Hemoglobin: 12.8 g/dL (ref 12.0–15.0)
MCH: 37.4 pg — AB (ref 26.0–34.0)
MCHC: 34.8 g/dL (ref 30.0–36.0)
MCV: 107.6 fL — AB (ref 78.0–100.0)
PLATELETS: 199 10*3/uL (ref 150–400)
RBC: 3.42 MIL/uL — ABNORMAL LOW (ref 3.87–5.11)
RDW: 14.2 % (ref 11.5–15.5)
WBC: 6.4 10*3/uL (ref 4.0–10.5)

## 2018-05-14 LAB — LIPASE, BLOOD: Lipase: 35 U/L (ref 11–51)

## 2018-05-14 MED ORDER — SODIUM CHLORIDE 0.9 % IV BOLUS
1000.0000 mL | Freq: Once | INTRAVENOUS | Status: AC
  Administered 2018-05-14: 1000 mL via INTRAVENOUS

## 2018-05-14 NOTE — Telephone Encounter (Signed)
Pt aware should be taking Amiodarone 200 mg 1/2 tab twice daily for a total of 200 mg a day .Per telephone note from yesterday ./cy

## 2018-05-14 NOTE — ED Triage Notes (Addendum)
Patient states she is able to eat grits and yogurts, but when she eats more solid foods she has nausea. Patient reports that the flap between stomach and intestines does not open up properly and she has had several surgeries to fix. Patient was instructed by her PCP to come to the ED for IV fluids. Patient c/o abdominal bloating and pain after she eats.

## 2018-05-14 NOTE — Telephone Encounter (Signed)
New Message:    Pt says she would like to know the correct dose of Amiodarone she is supposed to be taking please.

## 2018-05-14 NOTE — ED Provider Notes (Signed)
[facility] DEPT Provider Note   CSN: [id number] Arrival date & time: 05/14/18  1823     History   Chief Complaint Chief Complaint  Patient presents with  . not eating  . Abdominal Pain    HPI Angela [name] is a 82 y.o. female.  Pt presents to the ED today with abd pain.  She has a hx of pyloric stenosis and has been seen by Dr. Therisa Doyne.  The pt had a repeat EGD with dilation of the pyloric channel last on 6/11 (3rd time for EGD/balloon dilation).  Dr. Therisa Doyne said she will need this balloon dilation every 6 weeks.  She also has a hx of afib and is treated with eliquis.  CHA2DS2/VAS Stroke Risk Points  Current as of 2 minutes ago     7 >= 2 Points: High Risk  1 - 1.99 Points: Medium Risk  0 Points: Low Risk    This is the only CHA2DS2/VAS Stroke Risk Points available for the past  year.:  Last Change: N/A     Details    This score determines the patient's risk of having a stroke if the  patient has atrial fibrillation.       Points Metrics  0 Has Congestive Heart Failure:  No    Current as of 2 minutes ago  1 Has Vascular Disease:  Yes    Current as of 2 minutes ago  1 Has Hypertension:  Yes    Current as of 2 minutes ago  2 Age:  35    Current as of 2 minutes ago  0 Has Diabetes:  No    Current as of 2 minutes ago  2 Had Stroke:  No  Had TIA:  Yes  Had thromboembolism:  No    Current as of 2 minutes ago  1 Female:  Yes    Current as of 2 minutes ago       Pt said she's lost weight (15-20 lbs).  She feels weak.  She lives alone and is afraid she's going to fall and hurt herself.  She feels a lot of bloating after eating.  She has not had vomiting, but has had nausea.         Past Medical History:  Diagnosis Date  . A-fib ([place])   . Acute renal failure ([place]) 06/15/2013  . Aortic stenosis    mild by echo 03/2012  . Asthma   . Atrial fibrillation ([place])   . Atrial flutter ([place]) 01/16/2016  . CAD (coronary artery disease)    s/p  PCI with BMS to mid LAD--2/08 not on aspirin due to frequent ulcers.  Repeat cath for CP 09/2012 with patent LAD stent and otherwise normal coronary arteries  . Cardiomyopathy ([place]) 01/30/2016  . Cervical stenosis of spine   . Chronic pain syndrome 12/02/2017  . Depression   . Duodenitis 01/16/2016  . Dyslipidemia   . Elbow fracture, right    history of- never any surgery  . Essential hypertension, benign 11/03/2013  . Gastric outlet obstruction 01/06/2016  . Gastroparesis 01/06/2016  . GERD (gastroesophageal reflux disease)   . Headache   . Hypertension   . Hyponatremia 06/15/2013  . Impaired physical mobility    due to rheumatoid arthritis "ambulates with walker" limited free standing.  . Intestinal polyp   . Macular degeneration   . Obesity   . Osteoporosis    alendronate since 2012  . PUD (peptic ulcer disease)    can  not take aspirin  . PVC's (premature ventricular contractions)   . RA (rheumatoid arthritis) ([place])   . Rheumatoid arthritis(714.0)    Dr. Lenna Gilford follows  . Spondylolisthesis of lumbar region   . SVT (supraventricular tachycardia) ([place]) 01/16/2016    Patient Active Problem List   Diagnosis Date Noted  . Chronic pain syndrome 12/02/2017  . Reactive depression 07/11/2016  . Abnormal nuclear stress test 03/07/2016  . Cardiomyopathy ([place]) 01/30/2016  . Atrial flutter ([place]) 01/16/2016  . SVT (supraventricular tachycardia) ([place]) 01/16/2016  . Duodenitis 01/16/2016  . Atrial fibrillation ([place]) 01/13/2016  . Protein-calorie malnutrition, severe 01/11/2016  . Gastric outlet obstruction 01/06/2016  . Gastroparesis 01/06/2016  . PAF (paroxysmal atrial fibrillation) ([place]) 01/06/2016  . Constipation 07/20/2015  . Edema extremities 02/18/2014  . Aortic stenosis   . CAD (coronary artery disease)   . PVC's (premature ventricular contractions) 11/03/2013  . Dyslipidemia 11/03/2013  . Essential hypertension, benign 11/03/2013  . Acute renal failure ([place]) 06/15/2013  .  Hyponatremia 06/15/2013  . Diarrhea in adult patient 06/15/2013  . Acidosis 06/15/2013  . Cervical spondylosis with myelopathy 01/29/2012  . Rheumatoid arthritis ([place]) 01/29/2012  . Spondylolisthesis of lumbar region 01/29/2012    Past Surgical History:  Procedure Laterality Date  . ANKLE FUSION Bilateral    x2 both  . BALLOON DILATION N/A 01/10/2018   Procedure: BALLOON DILATION;  Surgeon: [name], MD;  Location: [facility];  Service: Gastroenterology;  Laterality: N/A;  . BALLOON DILATION N/A 01/24/2018   Procedure: Larrie Kass DILATION;  Surgeon: [name], MD;  Location: [facility];  Service: Gastroenterology;  Laterality: N/A;  W/ Fluoroscopy. Please use colonoscope.  Marland Kitchen BALLOON DILATION N/A 04/15/2018   Procedure: BALLOON DILATION;  Surgeon: [name], MD;  Location: [facility];  Service: Gastroenterology;  Laterality: N/A;  . CARDIAC CATHETERIZATION  09/2012   patent LAD stent and otherwise normal coronary arteries  . CARDIAC CATHETERIZATION N/A 03/07/2016   Procedure: Left Heart Cath and Coronary Angiography;  Surgeon: Peter M Martinique, MD;  Location: [facility];  Service: Cardiovascular;  Laterality: N/A;  . CARDIOVERSION N/A 03/13/2018   Procedure: CARDIOVERSION;  Surgeon: Lelon Perla, MD;  Location: [facility] ENDOSCOPY;  Service: Cardiovascular;  Laterality: N/A;  . CARDIOVERSION N/A 05/09/2018   Procedure: CARDIOVERSION;  Surgeon: Pixie Casino, MD;  Location: [facility] ENDOSCOPY;  Service: Cardiovascular;  Laterality: N/A;  . CATARACT EXTRACTION, BILATERAL Bilateral   . CERVICAL FUSION     limited  ROM  . CHOLECYSTECTOMY    . ESOPHAGOGASTRODUODENOSCOPY N/A 01/07/2016   Procedure: ESOPHAGOGASTRODUODENOSCOPY (EGD);  Surgeon: Wilford Corner, MD;  Location: [facility];  Service: Endoscopy;  Laterality: N/A;  . ESOPHAGOGASTRODUODENOSCOPY N/A 01/10/2018   Procedure: ESOPHAGOGASTRODUODENOSCOPY (EGD);  Surgeon: [name], MD;  Location: [facility];  Service:  Gastroenterology;  Laterality: N/A;  . ESOPHAGOGASTRODUODENOSCOPY N/A 01/24/2018   Procedure: ESOPHAGOGASTRODUODENOSCOPY (EGD);  Surgeon: [name], MD;  Location: [facility];  Service: Gastroenterology;  Laterality: N/A;  . ESOPHAGOGASTRODUODENOSCOPY N/A 04/15/2018   Procedure: ESOPHAGOGASTRODUODENOSCOPY (EGD);  Surgeon: [name], MD;  Location: [facility];  Service: Gastroenterology;  Laterality: N/A;  . ESOPHAGOGASTRODUODENOSCOPY (EGD) WITH PROPOFOL N/A 11/01/2015   Procedure: ESOPHAGOGASTRODUODENOSCOPY (EGD) WITH PROPOFOL w/ Dialtation;  Surgeon: Garlan Fair, MD;  Location: [facility] ENDOS[facility];  Service: Endoscopy;  Laterality: N/A;  . FRACTURE SURGERY  09/2011   left ankle screw and pin removed  . HAND SURGERY Bilateral    x4 digits 2-5 both hands  . JOINT REPLACEMENT Bilateral    BTKA  .  TONSILLECTOMY       OB History   None      Home Medications    Prior to Admission medications   Medication Sig Start Date End Date Taking? Authorizing Provider  acebutolol (SECTRAL) 200 MG capsule Take 1 capsule (200 mg total) by mouth daily. 03/10/18  Yes Bhagat, Bhavinkumar, PA  amiodarone (PACERONE) 200 MG tablet Take 1 tablet (200 mg total) by mouth 2 (two) times daily. Patient taking differently: Take 100 mg by mouth 2 (two) times daily.  04/29/18  Yes Camnitz, Ocie Doyne, MD  apixaban (ELIQUIS) 5 MG TABS tablet Take 1 tablet (5 mg total) by mouth 2 (two) times daily. 07/31/17  Yes Daune Perch, NP  butalbital-acetaminophen-caffeine (FIORICET, ESGIC) 50-325-40 MG per tablet Take 1 tablet by mouth every 6 (six) hours as needed for headache or migraine.  06/23/12  Yes [provider]  Calcium Carbonate-Vitamin D (CALCIUM 500+D PO) Take 1 tablet by mouth 2 (two) times daily.    Yes [provider]  DENTAGEL 1.1 % GEL dental gel Place 1 application onto teeth at bedtime.  12/12/15  Yes [provider]  diclofenac sodium (VOLTAREN) 1 % GEL Apply 2 grams topically at  bedtime to shoulders and elbows 01/29/18  Yes Meredith Staggers, MD  Docusate Calcium (STOOL SOFTENER PO) Take 1 tablet by mouth daily as needed (constipation).    Yes [provider]  escitalopram (LEXAPRO) 5 MG tablet Take 5 mg by mouth in the morning 02/19/18  Yes Meredith Staggers, MD  ferrous sulfate 325 (65 FE) MG tablet Take 325 mg by mouth daily with breakfast.   Yes [provider]  folic acid (FOLVITE) 1 MG tablet Take 1 mg by mouth daily.  12/03/13  Yes [provider]  furosemide (LASIX) 40 MG tablet TAKE 1 TABLET BY MOUTH EVERY DAY TAKE 1/2 TAB AS NEEDED FOR EXTRA SWELLING 02/11/18  Yes Nahser, Wonda Cheng, MD  lidocaine (LIDODERM) 5 % PLACE 3 PATCHES ONTO THE SKIN EVERY 12 (TWELVE) HOURS. APPLY NECK AND ELBOW Patient taking differently: Place 2 patches onto the skin daily.  11/01/17  Yes Meredith Staggers, MD  LORazepam (ATIVAN) 2 MG tablet Take 2 mg by mouth at bedtime. 02/05/15  Yes [provider]  methotrexate (RHEUMATREX) 2.5 MG tablet Take 15 mg by mouth every Saturday.  03/01/12  Yes [provider]  nitroGLYCERIN (NITROSTAT) 0.4 MG SL tablet Place 1 tablet (0.4 mg total) under the tongue every 5 (five) minutes as needed for chest pain. 02/21/16  Yes Turner, Eber Hong, MD  nystatin cream (MYCOSTATIN) Apply 1 application topically daily as needed for dry skin.   Yes [provider]  oxyCODONE-acetaminophen (PERCOCET) 7.5-325 MG tablet Take 1 tablet by mouth every 6 (six) hours as needed for moderate pain. 04/30/18  Yes Meredith Staggers, MD  pantoprazole (PROTONIX) 40 MG tablet Take 40 mg by mouth 2 (two) times daily.    Yes [provider]  predniSONE (DELTASONE) 5 MG tablet Take 5 mg by mouth daily with breakfast.    Yes [provider]  PREVIDENT 0.2 % SOLN Take 1 each by mouth See admin instructions. Rinses with about 1 capful nightly 09/05/12  Yes [provider]  propranolol (INDERAL) 10 MG tablet Take 1 tablet  (10 mg total) by mouth as needed (for palpitations). 03/10/18  Yes Bhagat, Bhavinkumar, PA  rosuvastatin (CRESTOR) 10 MG tablet Take 1 tablet (10 mg total) by mouth daily. 03/11/18 06/09/18 Yes Bhagat,  Bhavinkumar, PA  TOVIAZ 8 MG TB24 tablet Take 8 mg by mouth daily. 10/25/15  Yes [provider]  Turmeric 500 MG CAPS Take 500 mg by mouth daily.   Yes [provider]  vitamin B-12 (CYANOCOBALAMIN) 1000 MCG tablet Take 1,000 mcg by mouth daily.   Yes [provider]  vitamin C (ASCORBIC ACID) 500 MG tablet Take 500 mg by mouth daily.    Yes [provider]    Family History Family History  Problem Relation Age of Onset  . Lung cancer Mother   . Arrhythmia Mother   . Melanoma Brother   . Heart attack Neg Hx     Social History Social History   Tobacco Use  . Smoking status: Never Smoker  . Smokeless tobacco: Never Used  Substance Use Topics  . Alcohol use: No  . Drug use: No     Allergies   Peanut-containing drug products; Reglan [metoclopramide]; Clindamycin/lincomycin; Codeine; Iohexol; Pineapple; and Shellfish allergy   Review of Systems Review of Systems  Gastrointestinal: Positive for abdominal pain and nausea.  All other systems reviewed and are negative.    Physical Exam Updated Vital Signs BP (!) 110/59 (BP Location: Left Arm)   Pulse 66   Temp 98.1 F (36.7 C) (Oral)   Resp 15   Ht 5\' 1"  (1.549 m)   Wt 47.6 kg (105 lb)   SpO2 99%   BMI 19.84 kg/m   Physical Exam  Constitutional: She is oriented to person, place, and time. She appears well-developed and well-nourished.  HENT:  Head: Normocephalic and atraumatic.  Mouth/Throat: Mucous membranes are dry.  Eyes: Pupils are equal, round, and reactive to light. EOM are normal.  Cardiovascular: Normal rate, regular rhythm, normal heart sounds and intact distal pulses.  Pulmonary/Chest: Effort normal and breath sounds normal.  Abdominal: Normal appearance and bowel sounds are  normal. There is tenderness in the epigastric area.  Musculoskeletal:  Bilateral edema 1+.  RA deformity hands.  Chronic deformity right elbow.  Neurological: She is alert and oriented to person, place, and time.  Skin: Skin is warm and dry. Capillary refill takes less than 2 seconds.  Psychiatric: She has a normal mood and affect. Her behavior is normal.  Nursing note and vitals reviewed.    ED Treatments / Results  Labs (all labs ordered are listed, but only abnormal results are displayed) Labs Reviewed  COMPREHENSIVE METABOLIC PANEL - Abnormal; Notable for the following components:      Result Value   Sodium 133 (*)    Total Protein 5.8 (*)    Alkaline Phosphatase 31 (*)    All other components within normal limits  CBC - Abnormal; Notable for the following components:   RBC 3.42 (*)    MCV 107.6 (*)    MCH 37.4 (*)    All other components within normal limits  URINALYSIS, ROUTINE W REFLEX MICROSCOPIC - Abnormal; Notable for the following components:   Ketones, ur 5 (*)    All other components within normal limits  LIPASE, BLOOD    EKG None  Radiology Dg Abdomen Acute W/chest  Result Date: 05/14/2018 CLINICAL DATA:  82 y/o F; unable to eat or drink for over a week due to nausea. Weakness and dehydration. History of pyloric stenosis. EXAM: DG ABDOMEN ACUTE W/ 1V CHEST COMPARISON:  01/10/2016 CT abdomen and pelvis FINDINGS: Normal cardiac silhouette. Aortic atherosclerosis with calcification. Clear lungs. No pleural effusion or pneumothorax. Partially visualized cervical fusion hardware. Osteoarthrosis of  the left glenohumeral joint. Right upper quadrant cholecystectomy clips. Nonobstructive bowel gas pattern. Lumbar S-shaped curvature with advanced lower lumbar spondylosis. IMPRESSION: No acute pulmonary process.  Nonobstructive bowel gas pattern. Electronically Signed   By: Kristine Garbe M.D.   On: 05/14/2018 22:00    Procedures Procedures (including critical  care time)  Medications Ordered in ED Medications  sodium chloride 0.9 % bolus 1,000 mL (1,000 mLs Intravenous New Bag/Given 05/14/18 2202)     Initial Impression / Assessment and Plan / ED Course  I have reviewed the triage vital signs and the nursing notes.  Pertinent labs & imaging results that were available during my care of the patient were reviewed by me and considered in my medical decision making (see chart for details).    Pt feeling better after IVFs.  She does have home PT that is starting soon.  We spoke about maybe getting a blender and making smoothies to increase protein and calorie counts.  I discharged her with a list of high protein and high calorie foods.  She is encouraged to f/u with her GI doctor and her pcp.  Return if worse.  Final Clinical Impressions(s) / ED Diagnoses   Final diagnoses:  Pyloric stenosis in adult  Dehydration    ED Discharge Orders    None       Isla Pence, MD 05/14/18 2308

## 2018-05-15 ENCOUNTER — Other Ambulatory Visit: Payer: Self-pay | Admitting: Gastroenterology

## 2018-05-15 ENCOUNTER — Telehealth: Payer: Self-pay

## 2018-05-15 DIAGNOSIS — I482 Chronic atrial fibrillation: Secondary | ICD-10-CM | POA: Diagnosis not present

## 2018-05-15 DIAGNOSIS — I2510 Atherosclerotic heart disease of native coronary artery without angina pectoris: Secondary | ICD-10-CM | POA: Diagnosis not present

## 2018-05-15 DIAGNOSIS — M810 Age-related osteoporosis without current pathological fracture: Secondary | ICD-10-CM | POA: Diagnosis not present

## 2018-05-15 DIAGNOSIS — K3184 Gastroparesis: Secondary | ICD-10-CM | POA: Diagnosis not present

## 2018-05-15 DIAGNOSIS — R296 Repeated falls: Secondary | ICD-10-CM | POA: Diagnosis not present

## 2018-05-15 DIAGNOSIS — M069 Rheumatoid arthritis, unspecified: Secondary | ICD-10-CM | POA: Diagnosis not present

## 2018-05-15 NOTE — Telephone Encounter (Signed)
   [facility] Pre-operative Risk Assessment    Request for surgical clearance:  1. What type of surgery is being performed? Endoscopy   2. When is this surgery scheduled? 06/17/2018   3. What type of clearance is required (medical clearance vs. Pharmacy clearance to hold med vs. Both)? both  4. Are there any medications that need to be held prior to surgery and how long? Eliquis, x 1 day   5. Practice name and name of physician performing surgery? [facility], Dr. Therisa Doyne   6. What is your office phone number? [phone]    7.   What is your office fax number? [phone]  8.   Anesthesia type (None, local, MAC, general) ? Not listed    Joaquim Lai 05/15/2018, 3:58 PM  _________________________________________________________________   (provider comments below)

## 2018-05-16 NOTE — Telephone Encounter (Signed)
Angela [name] had cardioversion on 05/09/18 and will need to continue anticoagulation uninterrupted for 4 weeks following. SHe has an appointment on 7/19 with Dr. Acie Fredrickson and on 7/30 with the afib clinic and can address this clearance at either of those times.   I will route this to both Dr. Acie Fredrickson and Doristine Devoid, NP.

## 2018-05-18 NOTE — Telephone Encounter (Signed)
I will see her on July 19 She should be OK to hold Eliquis at that point

## 2018-05-21 DIAGNOSIS — I482 Chronic atrial fibrillation: Secondary | ICD-10-CM | POA: Diagnosis not present

## 2018-05-21 DIAGNOSIS — M069 Rheumatoid arthritis, unspecified: Secondary | ICD-10-CM | POA: Diagnosis not present

## 2018-05-21 DIAGNOSIS — K3184 Gastroparesis: Secondary | ICD-10-CM | POA: Diagnosis not present

## 2018-05-21 DIAGNOSIS — I2510 Atherosclerotic heart disease of native coronary artery without angina pectoris: Secondary | ICD-10-CM | POA: Diagnosis not present

## 2018-05-21 DIAGNOSIS — M810 Age-related osteoporosis without current pathological fracture: Secondary | ICD-10-CM | POA: Diagnosis not present

## 2018-05-21 DIAGNOSIS — R296 Repeated falls: Secondary | ICD-10-CM | POA: Diagnosis not present

## 2018-05-23 ENCOUNTER — Ambulatory Visit (INDEPENDENT_AMBULATORY_CARE_PROVIDER_SITE_OTHER): Payer: Medicare Other | Admitting: Cardiovascular Disease

## 2018-05-23 ENCOUNTER — Encounter: Payer: Self-pay | Admitting: Cardiovascular Disease

## 2018-05-23 VITALS — BP 92/52 | HR 58 | Ht 61.0 in | Wt 112.4 lb

## 2018-05-23 DIAGNOSIS — R600 Localized edema: Secondary | ICD-10-CM

## 2018-05-23 DIAGNOSIS — I481 Persistent atrial fibrillation: Secondary | ICD-10-CM

## 2018-05-23 DIAGNOSIS — I255 Ischemic cardiomyopathy: Secondary | ICD-10-CM | POA: Diagnosis not present

## 2018-05-23 DIAGNOSIS — I4819 Other persistent atrial fibrillation: Secondary | ICD-10-CM

## 2018-05-23 MED ORDER — POTASSIUM BICARB-CITRIC ACID 10 MEQ PO TBEF: 10.0000 meq | EFFERVESCENT_TABLET | Freq: Every day | ORAL | 3 refills | Status: AC

## 2018-05-23 MED ORDER — FUROSEMIDE 40 MG PO TABS: 40.0000 mg | ORAL_TABLET | Freq: Every day | ORAL | 3 refills | Status: AC

## 2018-05-23 NOTE — Patient Instructions (Addendum)
Medication Instructions:  Your physician has recommended you make the following change in your medication:   DECREASE Lasix (Furosemide) to 40 mg once daily START Effer K (potassium supplement) 10 mEq once daily   Labwork: Your physician recommends that you return for lab work in: 2-3 weeks for basic metabolic panel   Testing/Procedures: None Ordered   Follow-Up: Your physician recommends that you schedule a follow-up appointment in: 3 months with Dr. Acie Fredrickson   If you need a refill on your cardiac medications before your next appointment, please call your pharmacy.   Thank you for choosing [facility]! Christen Bame, RN [phone]

## 2018-05-23 NOTE — Telephone Encounter (Signed)
Pt is at low risk for her precedure. She may hold Eliquis for 1-2 days prior to procedure

## 2018-05-23 NOTE — Progress Notes (Signed)
Cardiology Office Note:    Date:  05/23/2018   ID:  Angela [name], DOB 03-05-1936, MRN [id number]  PCP:  [name], MD  Cardiologist:  Dr. Fransico Him  >>    Angela [name]  Electrophysiologist:  n/a  Chief Complaint  Patient presents with  . Atrial Fibrillation   Problem List 1. Paroxysmal atrial fib 2. CAD - PCI to LAD  3. Rheumatiod arthritis  4.HTN  5. Mild pulmonary HTN  - never smoked  6. Aortic stenosis  - moderate AS    History of Present Illness:     Angela [name] is a 82 y.o. female with a hx of CAD, s/p prior PCI to the LAD, HL, HTN, PVCs, rheumatoid arthritis, peptic ulcer disease, aortic stenosis.  She was seen for bradycardia and Holter in 8/15 showed NSR with PACs and PVCs and ATach.  Toprol was changed to Acebutolol.  Bradycardia resolved.  Last seen by Dr. Fransico Him in 4/16.  Prior echo suggested mod AS.  Repeat limited echo demonstrated just mild AS.   I saw her in 2/17 with increased swelling in her feet.  I felt her LE edema was multifactorial and related to venous insufficiency, inactivity, diastolic dysfunction.  I asked her to take extra Furosemide prn, wear compression stockings and to keep her legs elevated.  Admitted 3/17 with gastric outlet obstruction noted on EGD.  She had an NG tube placed and underwent EGD demonstrating a polypoid lesion in the pyloric channel with surrounding ulcer and edema.  Bx was neg for malignancy.  Hospital course was c/b AFlutter with RVR and she was seen by Cardiology.  Echo demonstrated mod AS, mod TR, EF 45%, LAE.  Acebutolol was increased to 400 bid.  She converted to NSR.   She also had episodes of PSVT. She was given prn Propranolol to use.  CHADS2-VASc=6 (CHF, vascular dz, female, age, HTN).  She was placed on Apixaban.    Last seen by me 01/30/16.  She was in NSR.  I decreased her Acebutolol due to low BP.      Since her Myoview, she has had no further chest pain.  She feels fatigued and dizzy at times.   Denies any significant change in her breathing.  Denies syncope, PND.  LE edema is chronic and stable.    Mar 21, 2016:  Angela [name] is seen today .   I saw her in the hospital for an episode of PAF that occurred during an admission for small bowel obstruction . Has had some dizziness.    Not eating well Has lost some weight - has lost 30-40 lbs over the past year.   Her acebutolol dose is 400 mg in the AM, 200 mg in the PM   May 25, 2016:  Had TIA like symptoms 2 days ago Blurry vision in the right eye. , no weakness in her arms and legs.   Was seen by EMS  She did not go to the hospital . HR is irregular  (has PVCs on monitor )  Has had some leg swelling .  Recommended leg elevation and compression hose  ( cant put on the compression hose due to the RA in her hands )   Had a cath in may,   Normal LV function, Patent LAD stent  Echo :  EF 45% , mild pulmonary HTN, mild MR   We decreased her Acebutolol to 200 mg a day and she feels better   January 17, 2017:  Has not felt that well over the past several months. Had a GI bug that lasted several weeks.  Has had some bilateral feet swelling .  Has to eat soft foods - lots of soup several times a week.  Feet have been swelling for a year or so   Dec. 18, 2018:  Doing well from a cardiac standpoint.   [place] and has a bruise on her left leg   May 23, 2018: Angela [name] is seen today as part of a preop evaluation.  She has a history of atrial fibrillation and coronary artery disease. She is scheduled for endoscopy on August 13.  She had a cardioversion on July 5 and needs to remain on anticoagulation at least 3 to 4 weeks following the cardioversion.  She remains in normal rhythm. Feels very weak today  Needs to have dilitation of her GES.  Not eating very well ,  Drinking lots of Boost or ensure  Has had significant worsening of her leg edema . Had questions about amiodarone .   Past Medical History:  Diagnosis Date  . A-fib ([place])    . Acute renal failure ([place]) 06/15/2013  . Aortic stenosis    mild by echo 03/2012  . Asthma   . Atrial fibrillation ([place])   . Atrial flutter ([place]) 01/16/2016  . CAD (coronary artery disease)    s/p PCI with BMS to mid LAD--2/08 not on aspirin due to frequent ulcers.  Repeat cath for CP 09/2012 with patent LAD stent and otherwise normal coronary arteries  . Cardiomyopathy ([place]) 01/30/2016  . Cervical stenosis of spine   . Chronic pain syndrome 12/02/2017  . Depression   . Duodenitis 01/16/2016  . Dyslipidemia   . Elbow fracture, right    history of- never any surgery  . Essential hypertension, benign 11/03/2013  . Gastric outlet obstruction 01/06/2016  . Gastroparesis 01/06/2016  . GERD (gastroesophageal reflux disease)   . Headache   . Hypertension   . Hyponatremia 06/15/2013  . Impaired physical mobility    due to rheumatoid arthritis "ambulates with walker" limited free standing.  . Intestinal polyp   . Macular degeneration   . Obesity   . Osteoporosis    alendronate since 2012  . PUD (peptic ulcer disease)    can not take aspirin  . PVC's (premature ventricular contractions)   . RA (rheumatoid arthritis) ([place])   . Rheumatoid arthritis(714.0)    Dr. Lenna Gilford follows  . Spondylolisthesis of lumbar region   . SVT (supraventricular tachycardia) ([place]) 01/16/2016    Past Surgical History:  Procedure Laterality Date  . ANKLE FUSION Bilateral    x2 both  . BALLOON DILATION N/A 01/10/2018   Procedure: BALLOON DILATION;  Surgeon: [name], MD;  Location: [place];  Service: Gastroenterology;  Laterality: N/A;  . BALLOON DILATION N/A 01/24/2018   Procedure: Larrie Kass DILATION;  Surgeon: [name], MD;  Location: [place];  Service: Gastroenterology;  Laterality: N/A;  W/ Fluoroscopy. Please use colonoscope.  Marland Kitchen BALLOON DILATION N/A 04/15/2018   Procedure: BALLOON DILATION;  Surgeon: [name], MD;  Location: [facility];  Service: Gastroenterology;  Laterality: N/A;  . CARDIAC  CATHETERIZATION  09/2012   patent LAD stent and otherwise normal coronary arteries  . CARDIAC CATHETERIZATION N/A 03/07/2016   Procedure: Left Heart Cath and Coronary Angiography;  Surgeon: Peter M Martinique, MD;  Location: [place] [facility];  Service: Cardiovascular;  Laterality: N/A;  . CARDIOVERSION N/A 03/13/2018   Procedure: CARDIOVERSION;  Surgeon: Kirk Ruths  S, MD;  Location: [place];  Service: Cardiovascular;  Laterality: N/A;  . CARDIOVERSION N/A 05/09/2018   Procedure: CARDIOVERSION;  Surgeon: Pixie Casino, MD;  Location: [facility];  Service: Cardiovascular;  Laterality: N/A;  . CATARACT EXTRACTION, BILATERAL Bilateral   . CERVICAL FUSION     limited  ROM  . CHOLECYSTECTOMY    . ESOPHAGOGASTRODUODENOSCOPY N/A 01/07/2016   Procedure: ESOPHAGOGASTRODUODENOSCOPY (EGD);  Surgeon: Wilford Corner, MD;  Location: [facility];  Service: Endoscopy;  Laterality: N/A;  . ESOPHAGOGASTRODUODENOSCOPY N/A 01/10/2018   Procedure: ESOPHAGOGASTRODUODENOSCOPY (EGD);  Surgeon: [name], MD;  Location: [place];  Service: Gastroenterology;  Laterality: N/A;  . ESOPHAGOGASTRODUODENOSCOPY N/A 01/24/2018   Procedure: ESOPHAGOGASTRODUODENOSCOPY (EGD);  Surgeon: [name], MD;  Location: [place];  Service: Gastroenterology;  Laterality: N/A;  . ESOPHAGOGASTRODUODENOSCOPY N/A 04/15/2018   Procedure: ESOPHAGOGASTRODUODENOSCOPY (EGD);  Surgeon: [name], MD;  Location: [facility];  Service: Gastroenterology;  Laterality: N/A;  . ESOPHAGOGASTRODUODENOSCOPY (EGD) WITH PROPOFOL N/A 11/01/2015   Procedure: ESOPHAGOGASTRODUODENOSCOPY (EGD) WITH PROPOFOL w/ Dialtation;  Surgeon: Garlan Fair, MD;  Location: [facility];  Service: Endoscopy;  Laterality: N/A;  . FRACTURE SURGERY  09/2011   left ankle screw and pin removed  . HAND SURGERY Bilateral    x4 digits 2-5 both hands  . JOINT REPLACEMENT Bilateral    BTKA  . TONSILLECTOMY      Current Medications: Outpatient Medications  Prior to Visit  Medication Sig Dispense Refill  . acebutolol (SECTRAL) 200 MG capsule Take 1 capsule (200 mg total) by mouth daily. 90 capsule 3  . amiodarone (PACERONE) 200 MG tablet Take 1 tablet (200 mg total) by mouth 2 (two) times daily. 180 tablet 1  . apixaban (ELIQUIS) 5 MG TABS tablet Take 1 tablet (5 mg total) by mouth 2 (two) times daily. 180 tablet 3  . butalbital-acetaminophen-caffeine (FIORICET, ESGIC) 50-325-40 MG per tablet Take 1 tablet by mouth every 6 (six) hours as needed for headache or migraine.     . Calcium Carbonate-Vitamin D (CALCIUM 500+D PO) Take 1 tablet by mouth 2 (two) times daily.     . DENTAGEL 1.1 % GEL dental gel Place 1 application onto teeth at bedtime.     . diclofenac sodium (VOLTAREN) 1 % GEL Apply 2 grams topically at bedtime to shoulders and elbows 300 g 4  . Docusate Calcium (STOOL SOFTENER PO) Take 1 tablet by mouth daily as needed (constipation).     Marland Kitchen escitalopram (LEXAPRO) 5 MG tablet Take 5 mg by mouth in the morning 90 tablet 1  . ferrous sulfate 325 (65 FE) MG tablet Take 325 mg by mouth daily with breakfast.    . folic acid (FOLVITE) 1 MG tablet Take 1 mg by mouth daily.     . furosemide (LASIX) 40 MG tablet TAKE 1 TABLET BY MOUTH EVERY DAY TAKE 1/2 TAB AS NEEDED FOR EXTRA SWELLING 45 tablet 7  . lidocaine (LIDODERM) 5 % PLACE 3 PATCHES ONTO THE SKIN EVERY 12 (TWELVE) HOURS. APPLY NECK AND ELBOW (Patient taking differently: Place 2 patches onto the skin daily. ) 90 patch 5  . LORazepam (ATIVAN) 2 MG tablet Take 2 mg by mouth at bedtime.  5  . methotrexate (RHEUMATREX) 2.5 MG tablet Take 15 mg by mouth every Saturday.     . nitroGLYCERIN (NITROSTAT) 0.4 MG SL tablet Place 1 tablet (0.4 mg total) under the tongue every 5 (five) minutes as needed for chest pain. 25 tablet 1  . nystatin cream (MYCOSTATIN) Apply  1 application topically daily as needed for dry skin.    Marland Kitchen oxyCODONE-acetaminophen (PERCOCET) 7.5-325 MG tablet Take 1 tablet by mouth every 6  (six) hours as needed for moderate pain. 90 tablet 0  . pantoprazole (PROTONIX) 40 MG tablet Take 40 mg by mouth 2 (two) times daily.     . predniSONE (DELTASONE) 5 MG tablet Take 5 mg by mouth daily with breakfast.     . PREVIDENT 0.2 % SOLN Take 1 each by mouth See admin instructions. Rinses with about 1 capful nightly    . propranolol (INDERAL) 10 MG tablet Take 1 tablet (10 mg total) by mouth as needed (for palpitations). 30 tablet 6  . rosuvastatin (CRESTOR) 10 MG tablet Take 1 tablet (10 mg total) by mouth daily. 90 tablet 3  . TOVIAZ 8 MG TB24 tablet Take 8 mg by mouth daily.    . Turmeric 500 MG CAPS Take 500 mg by mouth daily.    . vitamin B-12 (CYANOCOBALAMIN) 1000 MCG tablet Take 1,000 mcg by mouth daily.    . vitamin C (ASCORBIC ACID) 500 MG tablet Take 500 mg by mouth daily.      No facility-administered medications prior to visit.      Allergies:   Peanut-containing drug products; Reglan [metoclopramide]; Clindamycin/lincomycin; Codeine; Iohexol; Pineapple; and Shellfish allergy   Social History   Socioeconomic History  . Marital status: Widowed    Spouse name: Not on file  . Number of children: Not on file  . Years of education: Not on file  . Highest education level: Not on file  Occupational History  . Not on file  Social Needs  . Financial resource strain: Not on file  . Food insecurity:    Worry: Not on file    Inability: Not on file  . Transportation needs:    Medical: Not on file    Non-medical: Not on file  Tobacco Use  . Smoking status: Never Smoker  . Smokeless tobacco: Never Used  Substance and Sexual Activity  . Alcohol use: No  . Drug use: No  . Sexual activity: Not on file  Lifestyle  . Physical activity:    Days per week: Not on file    Minutes per session: Not on file  . Stress: Not on file  Relationships  . Social connections:    Talks on phone: Not on file    Gets together: Not on file    Attends religious service: Not on file     Active member of club or organization: Not on file    Attends meetings of clubs or organizations: Not on file    Relationship status: Not on file  Other Topics Concern  . Not on file  Social History Narrative  . Not on file     Family History:  The patient's family history includes Arrhythmia in her mother; Lung cancer in her mother; Melanoma in her brother.   Review of systems: As noted in the current history, otherwise negative   Physical Exam: Blood pressure (!) 92/52, pulse (!) 58, height 5\' 1"  (1.549 m), weight 112 lb 6.4 oz (51 kg), SpO2 97 %.  GEN:   Elderly, frail female, she has difficulty walking and walks with the assistance of a walker. HEENT: Normal NECK: No JVD; No carotid bruits LYMPHATICS: No lymphadenopathy CARDIAC: RR,  Soft systolic murmur  rubs, gallops RESPIRATORY:  Clear to auscultation without rales, wheezing or rhonchi  ABDOMEN: Soft, non-tender, non-distended MUSCULOSKELETAL:  2-3 + pitting  edema bilaterally ,   Severe RA - arthritic hands.    SKIN: Warm and dry NEUROLOGIC:  Alert and oriented x 3   Wt Readings from Last 3 Encounters:  05/23/18 112 lb 6.4 oz (51 kg)  05/14/18 105 lb (47.6 kg)  04/30/18 105 lb (47.6 kg)      Studies/Labs Reviewed:     EKG:     Recent Labs: 07/31/2017: TSH 2.350 05/14/2018: ALT 19; BUN 12; Creatinine, Ser 0.46; Hemoglobin 12.8; Platelets 199; Potassium 3.7; Sodium 133   Recent Lipid Panel    Component Value Date/Time   CHOL 129 02/24/2015 1030   TRIG 81 01/09/2016 0515   HDL 58.80 02/24/2015 1030   CHOLHDL 2 02/24/2015 1030   VLDL 17.6 02/24/2015 1030   LDLCALC 53 02/24/2015 1030    Additional studies/ records that were reviewed today include:     ASSESSMENT:     No diagnosis found.  PLAN:      :  1. PAF/Flutter -   Has maintained NSR  , continiue with amiodarone 100 mg BID ( takes 1/2 tab BID)    We can reasses in several months   2. Leg edema :   Has worsened significantly , I suspect this  is because her nutritional status is so poor.  She has not been able to eat any real food and has been living on boost and Ensure.   Her lack of protein has made this leg edema worse.   3. Hypokalemia.   Potassium has been low.   Will give her effervescent potassoium 10 meq a day  Recheck BMP in 2-3 week s  4. CAD -    5. Aortic stenosis - Mod by recent echo.      6. HTN -   blood pressure is on the low side.  Her oral intake is very poor.  We will reduce the Lasix to 40 mg a day.   8. Possible TIA:       Mertie Moores, MD  05/23/2018 3:44 PM    [address],  [place], [place]  [id number] Pager [phone] Phone: [phone]; Fax: [phone]

## 2018-05-26 DIAGNOSIS — M810 Age-related osteoporosis without current pathological fracture: Secondary | ICD-10-CM | POA: Diagnosis not present

## 2018-05-26 NOTE — Telephone Encounter (Signed)
See note by Dr. Acie Fredrickson. I have faxed letter to requesting surgeon. Please make sure letter was received. Richardson Dopp, PA-C    05/26/2018 5:33 PM

## 2018-05-27 DIAGNOSIS — I2510 Atherosclerotic heart disease of native coronary artery without angina pectoris: Secondary | ICD-10-CM | POA: Diagnosis not present

## 2018-05-27 DIAGNOSIS — M069 Rheumatoid arthritis, unspecified: Secondary | ICD-10-CM | POA: Diagnosis not present

## 2018-05-27 DIAGNOSIS — K3184 Gastroparesis: Secondary | ICD-10-CM | POA: Diagnosis not present

## 2018-05-27 DIAGNOSIS — R296 Repeated falls: Secondary | ICD-10-CM | POA: Diagnosis not present

## 2018-05-27 DIAGNOSIS — I482 Chronic atrial fibrillation: Secondary | ICD-10-CM | POA: Diagnosis not present

## 2018-05-27 DIAGNOSIS — M810 Age-related osteoporosis without current pathological fracture: Secondary | ICD-10-CM | POA: Diagnosis not present

## 2018-05-27 NOTE — Telephone Encounter (Signed)
I s/w [facility]. Clearance letter does not seem to have been received. I will manually fax over clearance letter to [facility] today. Fax # [phone]

## 2018-05-29 DIAGNOSIS — I2510 Atherosclerotic heart disease of native coronary artery without angina pectoris: Secondary | ICD-10-CM | POA: Diagnosis not present

## 2018-05-29 DIAGNOSIS — I482 Chronic atrial fibrillation: Secondary | ICD-10-CM | POA: Diagnosis not present

## 2018-05-29 DIAGNOSIS — K3184 Gastroparesis: Secondary | ICD-10-CM | POA: Diagnosis not present

## 2018-05-29 DIAGNOSIS — R296 Repeated falls: Secondary | ICD-10-CM | POA: Diagnosis not present

## 2018-05-29 DIAGNOSIS — M069 Rheumatoid arthritis, unspecified: Secondary | ICD-10-CM | POA: Diagnosis not present

## 2018-05-29 DIAGNOSIS — M810 Age-related osteoporosis without current pathological fracture: Secondary | ICD-10-CM | POA: Diagnosis not present

## 2018-06-03 ENCOUNTER — Ambulatory Visit (HOSPITAL_COMMUNITY)
Admission: RE | Admit: 2018-06-03 | Discharge: 2018-06-03 | Disposition: A | Discharge status: Home / Self Care | Payer: Medicare Other | Source: Ambulatory Visit | Attending: Nurse Practitioner | Admitting: Nurse Practitioner

## 2018-06-03 ENCOUNTER — Encounter (HOSPITAL_COMMUNITY): Payer: Self-pay | Admitting: Nurse Practitioner

## 2018-06-03 VITALS — BP 94/50 | HR 57 | Ht 61.0 in | Wt 107.8 lb

## 2018-06-03 DIAGNOSIS — E785 Hyperlipidemia, unspecified: Secondary | ICD-10-CM | POA: Diagnosis not present

## 2018-06-03 DIAGNOSIS — Z888 Allergy status to other drugs, medicaments and biological substances status: Secondary | ICD-10-CM | POA: Insufficient documentation

## 2018-06-03 DIAGNOSIS — I429 Cardiomyopathy, unspecified: Secondary | ICD-10-CM | POA: Insufficient documentation

## 2018-06-03 DIAGNOSIS — Z9842 Cataract extraction status, left eye: Secondary | ICD-10-CM | POA: Diagnosis not present

## 2018-06-03 DIAGNOSIS — M069 Rheumatoid arthritis, unspecified: Secondary | ICD-10-CM | POA: Diagnosis not present

## 2018-06-03 DIAGNOSIS — K3184 Gastroparesis: Secondary | ICD-10-CM | POA: Diagnosis not present

## 2018-06-03 DIAGNOSIS — Z7952 Long term (current) use of systemic steroids: Secondary | ICD-10-CM | POA: Insufficient documentation

## 2018-06-03 DIAGNOSIS — Z9841 Cataract extraction status, right eye: Secondary | ICD-10-CM | POA: Diagnosis not present

## 2018-06-03 DIAGNOSIS — Z9049 Acquired absence of other specified parts of digestive tract: Secondary | ICD-10-CM | POA: Insufficient documentation

## 2018-06-03 DIAGNOSIS — Z808 Family history of malignant neoplasm of other organs or systems: Secondary | ICD-10-CM | POA: Insufficient documentation

## 2018-06-03 DIAGNOSIS — Z981 Arthrodesis status: Secondary | ICD-10-CM | POA: Diagnosis not present

## 2018-06-03 DIAGNOSIS — I2510 Atherosclerotic heart disease of native coronary artery without angina pectoris: Secondary | ICD-10-CM | POA: Insufficient documentation

## 2018-06-03 DIAGNOSIS — Z91013 Allergy to seafood: Secondary | ICD-10-CM | POA: Insufficient documentation

## 2018-06-03 DIAGNOSIS — I10 Essential (primary) hypertension: Secondary | ICD-10-CM | POA: Diagnosis not present

## 2018-06-03 DIAGNOSIS — Z79899 Other long term (current) drug therapy: Secondary | ICD-10-CM | POA: Diagnosis not present

## 2018-06-03 DIAGNOSIS — Z881 Allergy status to other antibiotic agents status: Secondary | ICD-10-CM | POA: Insufficient documentation

## 2018-06-03 DIAGNOSIS — Z91010 Allergy to peanuts: Secondary | ICD-10-CM | POA: Insufficient documentation

## 2018-06-03 DIAGNOSIS — J45909 Unspecified asthma, uncomplicated: Secondary | ICD-10-CM | POA: Diagnosis not present

## 2018-06-03 DIAGNOSIS — F329 Major depressive disorder, single episode, unspecified: Secondary | ICD-10-CM | POA: Insufficient documentation

## 2018-06-03 DIAGNOSIS — I350 Nonrheumatic aortic (valve) stenosis: Secondary | ICD-10-CM | POA: Insufficient documentation

## 2018-06-03 DIAGNOSIS — M810 Age-related osteoporosis without current pathological fracture: Secondary | ICD-10-CM | POA: Insufficient documentation

## 2018-06-03 DIAGNOSIS — Z8711 Personal history of peptic ulcer disease: Secondary | ICD-10-CM | POA: Insufficient documentation

## 2018-06-03 DIAGNOSIS — Z885 Allergy status to narcotic agent status: Secondary | ICD-10-CM | POA: Insufficient documentation

## 2018-06-03 DIAGNOSIS — I4819 Other persistent atrial fibrillation: Secondary | ICD-10-CM

## 2018-06-03 DIAGNOSIS — G894 Chronic pain syndrome: Secondary | ICD-10-CM | POA: Diagnosis not present

## 2018-06-03 DIAGNOSIS — H3530 Unspecified macular degeneration: Secondary | ICD-10-CM | POA: Insufficient documentation

## 2018-06-03 DIAGNOSIS — Z91018 Allergy to other foods: Secondary | ICD-10-CM | POA: Insufficient documentation

## 2018-06-03 DIAGNOSIS — Z7901 Long term (current) use of anticoagulants: Secondary | ICD-10-CM | POA: Diagnosis not present

## 2018-06-03 DIAGNOSIS — Z955 Presence of coronary angioplasty implant and graft: Secondary | ICD-10-CM | POA: Insufficient documentation

## 2018-06-03 DIAGNOSIS — I481 Persistent atrial fibrillation: Secondary | ICD-10-CM

## 2018-06-03 DIAGNOSIS — Z801 Family history of malignant neoplasm of trachea, bronchus and lung: Secondary | ICD-10-CM | POA: Diagnosis not present

## 2018-06-03 DIAGNOSIS — K219 Gastro-esophageal reflux disease without esophagitis: Secondary | ICD-10-CM | POA: Diagnosis not present

## 2018-06-03 DIAGNOSIS — Z91041 Radiographic dye allergy status: Secondary | ICD-10-CM | POA: Insufficient documentation

## 2018-06-03 NOTE — Progress Notes (Signed)
Primary Care Physician: [name], MD Referring Physician: Dr. Ileana Ladd Cardiologist: Dr. Tana Conch Angela [name] is a 82 y.o. female with a h/o persistent afib that was successfully cardioverted  7/5 for  f/u in the afib clinic.  Dr. Curt Bears increased amiodarone to 200 mg daily from 100 mg daily prior to cardioversion, which she take 100 mg bid. She feels improved in SR. Weight is stable.  Today, she denies symptoms of palpitations, chest pain, shortness of breath, orthopnea, PND, lower extremity edema, dizziness, presyncope, syncope, or neurologic sequela. The patient is tolerating medications without difficulties and is otherwise without complaint today.   Past Medical History:  Diagnosis Date  . A-fib ([place])   . Acute renal failure ([place]) 06/15/2013  . Aortic stenosis    mild by echo 03/2012  . Asthma   . Atrial fibrillation ([place])   . Atrial flutter ([place]) 01/16/2016  . CAD (coronary artery disease)    s/p PCI with BMS to mid LAD--2/08 not on aspirin due to frequent ulcers.  Repeat cath for CP 09/2012 with patent LAD stent and otherwise normal coronary arteries  . Cardiomyopathy ([place]) 01/30/2016  . Cervical stenosis of spine   . Chronic pain syndrome 12/02/2017  . Depression   . Duodenitis 01/16/2016  . Dyslipidemia   . Elbow fracture, right    history of- never any surgery  . Essential hypertension, benign 11/03/2013  . Gastric outlet obstruction 01/06/2016  . Gastroparesis 01/06/2016  . GERD (gastroesophageal reflux disease)   . Headache   . Hypertension   . Hyponatremia 06/15/2013  . Impaired physical mobility    due to rheumatoid arthritis "ambulates with walker" limited free standing.  . Intestinal polyp   . Macular degeneration   . Obesity   . Osteoporosis    alendronate since 2012  . PUD (peptic ulcer disease)    can not take aspirin  . PVC's (premature ventricular contractions)   . RA (rheumatoid arthritis) ([place])   . Rheumatoid arthritis(714.0)    Dr. Lenna Gilford follows   . Spondylolisthesis of lumbar region   . SVT (supraventricular tachycardia) ([place]) 01/16/2016   Past Surgical History:  Procedure Laterality Date  . ANKLE FUSION Bilateral    x2 both  . BALLOON DILATION N/A 01/10/2018   Procedure: BALLOON DILATION;  Surgeon: [name], MD;  Location: [place];  Service: Gastroenterology;  Laterality: N/A;  . BALLOON DILATION N/A 01/24/2018   Procedure: Larrie Kass DILATION;  Surgeon: [name], MD;  Location: [place];  Service: Gastroenterology;  Laterality: N/A;  W/ Fluoroscopy. Please use colonoscope.  Marland Kitchen BALLOON DILATION N/A 04/15/2018   Procedure: BALLOON DILATION;  Surgeon: [name], MD;  Location: [facility];  Service: Gastroenterology;  Laterality: N/A;  . CARDIAC CATHETERIZATION  09/2012   patent LAD stent and otherwise normal coronary arteries  . CARDIAC CATHETERIZATION N/A 03/07/2016   Procedure: Left Heart Cath and Coronary Angiography;  Surgeon: Peter M Martinique, MD;  Location: [facility];  Service: Cardiovascular;  Laterality: N/A;  . CARDIOVERSION N/A 03/13/2018   Procedure: CARDIOVERSION;  Surgeon: Lelon Perla, MD;  Location: [facility] ENDOSCOPY;  Service: Cardiovascular;  Laterality: N/A;  . CARDIOVERSION N/A 05/09/2018   Procedure: CARDIOVERSION;  Surgeon: Pixie Casino, MD;  Location: [facility] END[facility]COPY;  Service: Cardiovascular;  Laterality: N/A;  . CATARACT EXTRACTION, BILATERAL Bilateral   . CERVICAL FUSION     limited  ROM  . CHOLECYSTECTOMY    . ESOPHAGOGASTRODUODENOSCOPY N/A 01/07/2016   Procedure: ESOPHAGOGASTRODUODENOSCOPY (EGD);  Surgeon: Wilford Corner, MD;  Location: [facility];  Service: Endoscopy;  Laterality: N/A;  . ESOPHAGOGASTRODUODENOSCOPY N/A 01/10/2018   Procedure: ESOPHAGOGASTRODUODENOSCOPY (EGD);  Surgeon: [name], MD;  Location: [place];  Service: Gastroenterology;  Laterality: N/A;  . ESOPHAGOGASTRODUODENOSCOPY N/A 01/24/2018   Procedure: ESOPHAGOGASTRODUODENOSCOPY (EGD);  Surgeon: [name], MD;   Location: [place];  Service: Gastroenterology;  Laterality: N/A;  . ESOPHAGOGASTRODUODENOSCOPY N/A 04/15/2018   Procedure: ESOPHAGOGASTRODUODENOSCOPY (EGD);  Surgeon: [name], MD;  Location: [facility];  Service: Gastroenterology;  Laterality: N/A;  . ESOPHAGOGASTRODUODENOSCOPY (EGD) WITH PROPOFOL N/A 11/01/2015   Procedure: ESOPHAGOGASTRODUODENOSCOPY (EGD) WITH PROPOFOL w/ Dialtation;  Surgeon: Garlan Fair, MD;  Location: [facility] ENDOSCOPY;  Service: Endoscopy;  Laterality: N/A;  . FRACTURE SURGERY  09/2011   left ankle screw and pin removed  . HAND SURGERY Bilateral    x4 digits 2-5 both hands  . JOINT REPLACEMENT Bilateral    BTKA  . TONSILLECTOMY      Current Outpatient Medications  Medication Sig Dispense Refill  . acebutolol (SECTRAL) 200 MG capsule Take 1 capsule (200 mg total) by mouth daily. 90 capsule 3  . amiodarone (PACERONE) 200 MG tablet Take 1 tablet (200 mg total) by mouth 2 (two) times daily. 180 tablet 1  . apixaban (ELIQUIS) 5 MG TABS tablet Take 1 tablet (5 mg total) by mouth 2 (two) times daily. 180 tablet 3  . butalbital-acetaminophen-caffeine (FIORICET, ESGIC) 50-325-40 MG per tablet Take 1 tablet by mouth every 6 (six) hours as needed for headache or migraine.     . Calcium Carbonate-Vitamin D (CALCIUM 500+D PO) Take 1 tablet by mouth 2 (two) times daily.     . DENTAGEL 1.1 % GEL dental gel Place 1 application onto teeth at bedtime.     . diclofenac sodium (VOLTAREN) 1 % GEL Apply 2 grams topically at bedtime to shoulders and elbows 300 g 4  . Docusate Calcium (STOOL SOFTENER PO) Take 1 tablet by mouth daily as needed (constipation).     Marland Kitchen escitalopram (LEXAPRO) 5 MG tablet Take 5 mg by mouth in the morning 90 tablet 1  . ferrous sulfate 325 (65 FE) MG tablet Take 325 mg by mouth daily with breakfast.    . folic acid (FOLVITE) 1 MG tablet Take 1 mg by mouth daily.     . furosemide (LASIX) 40 MG tablet Take 1 tablet (40 mg total) by mouth daily. 90 tablet 3   . lidocaine (LIDODERM) 5 % PLACE 3 PATCHES ONTO THE SKIN EVERY 12 (TWELVE) HOURS. APPLY NECK AND ELBOW (Patient taking differently: Place 2 patches onto the skin daily. ) 90 patch 5  . LORazepam (ATIVAN) 2 MG tablet Take 2 mg by mouth at bedtime.  5  . methotrexate (RHEUMATREX) 2.5 MG tablet Take 15 mg by mouth every Saturday.     . nitroGLYCERIN (NITROSTAT) 0.4 MG SL tablet Place 1 tablet (0.4 mg total) under the tongue every 5 (five) minutes as needed for chest pain. 25 tablet 1  . nystatin cream (MYCOSTATIN) Apply 1 application topically daily as needed for dry skin.    Marland Kitchen oxyCODONE-acetaminophen (PERCOCET) 7.5-325 MG tablet Take 1 tablet by mouth every 6 (six) hours as needed for moderate pain. 90 tablet 0  . pantoprazole (PROTONIX) 40 MG tablet Take 40 mg by mouth 2 (two) times daily.     . Potassium Bicarb-Citric Acid (EFFER-K) 10 MEQ TBEF Take 1 tablet (10 mEq total) by mouth daily. 120 each 3  . predniSONE (DELTASONE) 5  MG tablet Take 5 mg by mouth daily with breakfast.     . PREVIDENT 0.2 % SOLN Take 1 each by mouth See admin instructions. Rinses with about 1 capful nightly    . propranolol (INDERAL) 10 MG tablet Take 1 tablet (10 mg total) by mouth as needed (for palpitations). 30 tablet 6  . rosuvastatin (CRESTOR) 10 MG tablet Take 1 tablet (10 mg total) by mouth daily. 90 tablet 3  . TOVIAZ 8 MG TB24 tablet Take 8 mg by mouth daily.    . Turmeric 500 MG CAPS Take 500 mg by mouth daily.    . vitamin B-12 (CYANOCOBALAMIN) 1000 MCG tablet Take 1,000 mcg by mouth daily.    . vitamin C (ASCORBIC ACID) 500 MG tablet Take 500 mg by mouth daily.      No current facility-administered medications for this encounter.     Allergies  Allergen Reactions  . Peanut-Containing Drug Products Anaphylaxis  . Reglan [Metoclopramide] Other (See Comments)    "Messed my mind up"  . Clindamycin/Lincomycin Diarrhea  . Codeine Nausea Only  . Iohexol Swelling and Other (See Comments)     Desc: pt  received hypaque 60% in 1994 and had erythema, hives & lips swell.  She did ok w/o premeds in 8/05 at [facility].  Pt. takes daily prednisone for rheumatoid arthritis.  Poor venous access today (7/7/6) so we did her w/o iv contrast.   . Pineapple Swelling  . Shellfish Allergy Swelling and Other (See Comments)    Lips swell    Social History   Socioeconomic History  . Marital status: Widowed    Spouse name: Not on file  . Number of children: Not on file  . Years of education: Not on file  . Highest education level: Not on file  Occupational History  . Not on file  Social Needs  . Financial resource strain: Not on file  . Food insecurity:    Worry: Not on file    Inability: Not on file  . Transportation needs:    Medical: Not on file    Non-medical: Not on file  Tobacco Use  . Smoking status: Never Smoker  . Smokeless tobacco: Never Used  Substance and Sexual Activity  . Alcohol use: No  . Drug use: No  . Sexual activity: Not on file  Lifestyle  . Physical activity:    Days per week: Not on file    Minutes per session: Not on file  . Stress: Not on file  Relationships  . Social connections:    Talks on phone: Not on file    Gets together: Not on file    Attends religious service: Not on file    Active member of club or organization: Not on file    Attends meetings of clubs or organizations: Not on file    Relationship status: Not on file  . Intimate partner violence:    Fear of current or ex partner: Not on file    Emotionally abused: Not on file    Physically abused: Not on file    Forced sexual activity: Not on file  Other Topics Concern  . Not on file  Social History Narrative  . Not on file    Family History  Problem Relation Age of Onset  . Lung cancer Mother   . Arrhythmia Mother   . Melanoma Brother   . Heart attack Neg Hx     ROS- All systems are reviewed and negative except as  per the HPI above  Physical Exam: Vitals:   06/03/18 1339  BP: (!)  94/50  Pulse: (!) 57  Weight: 107 lb 12.8 oz (48.9 kg)  Height: 5\' 1"  (1.549 m)   Wt Readings from Last 3 Encounters:  06/03/18 107 lb 12.8 oz (48.9 kg)  05/23/18 112 lb 6.4 oz (51 kg)  05/14/18 105 lb (47.6 kg)    Labs: Lab Results  Component Value Date   NA 133 (L) 05/14/2018   K 3.7 05/14/2018   CL 99 05/14/2018   CO2 23 05/14/2018   GLUCOSE 99 05/14/2018   BUN 12 05/14/2018   CREATININE 0.46 05/14/2018   CALCIUM 8.9 05/14/2018   PHOS 4.0 01/12/2016   MG 2.0 01/12/2016   Lab Results  Component Value Date   INR 1.27 03/05/2016   Lab Results  Component Value Date   CHOL 129 02/24/2015   HDL 58.80 02/24/2015   LDLCALC 53 02/24/2015   TRIG 81 01/09/2016     GEN- The patient is well appearing, alert and oriented x 3 today.   Head- normocephalic, atraumatic Eyes-  Sclera clear, conjunctiva pink Ears- hearing intact Oropharynx- clear Neck- supple, no JVP Lymph- no cervical lymphadenopathy Lungs- Clear to ausculation bilaterally, normal work of breathing Heart- Regular rate and rhythm, no murmurs, rubs or gallops, PMI not laterally displaced GI- soft, NT, ND, + BS Extremities- no clubbing, cyanosis, or edema MS- no significant deformity or atrophy Skin- no rash or lesion Psych- euthymic mood, full affect Neuro- strength and sensation are intact  EKG- Sinus brady at 57 bpm, pr int 162 ms, qrs int 80 ms, qtc 416 ms    Assessment and Plan: 1. Persistent afib Maintaining SR Feels improved Continue amiodarone at 100 mg bid  Continue Eliquis at 5 mg bid  F/u per Dr. Cathie Olden in October and Dr. Curt Bears in December  [place]. Dael Howland, [facility] [address] [place], [place] [address] [phone]

## 2018-06-04 DIAGNOSIS — R296 Repeated falls: Secondary | ICD-10-CM | POA: Diagnosis not present

## 2018-06-04 DIAGNOSIS — M069 Rheumatoid arthritis, unspecified: Secondary | ICD-10-CM | POA: Diagnosis not present

## 2018-06-04 DIAGNOSIS — K3184 Gastroparesis: Secondary | ICD-10-CM | POA: Diagnosis not present

## 2018-06-04 DIAGNOSIS — I2510 Atherosclerotic heart disease of native coronary artery without angina pectoris: Secondary | ICD-10-CM | POA: Diagnosis not present

## 2018-06-04 DIAGNOSIS — I482 Chronic atrial fibrillation: Secondary | ICD-10-CM | POA: Diagnosis not present

## 2018-06-04 DIAGNOSIS — M810 Age-related osteoporosis without current pathological fracture: Secondary | ICD-10-CM | POA: Diagnosis not present

## 2018-06-05 ENCOUNTER — Telehealth: Payer: Self-pay | Admitting: Cardiovascular Disease

## 2018-06-05 DIAGNOSIS — I482 Chronic atrial fibrillation: Secondary | ICD-10-CM | POA: Diagnosis not present

## 2018-06-05 DIAGNOSIS — I2510 Atherosclerotic heart disease of native coronary artery without angina pectoris: Secondary | ICD-10-CM | POA: Diagnosis not present

## 2018-06-05 DIAGNOSIS — M810 Age-related osteoporosis without current pathological fracture: Secondary | ICD-10-CM | POA: Diagnosis not present

## 2018-06-05 DIAGNOSIS — K3184 Gastroparesis: Secondary | ICD-10-CM | POA: Diagnosis not present

## 2018-06-05 DIAGNOSIS — M069 Rheumatoid arthritis, unspecified: Secondary | ICD-10-CM | POA: Diagnosis not present

## 2018-06-05 DIAGNOSIS — R296 Repeated falls: Secondary | ICD-10-CM | POA: Diagnosis not present

## 2018-06-05 MED ORDER — AMIODARONE HCL 100 MG PO TABS: 100.0000 mg | ORAL_TABLET | Freq: Two times a day (BID) | ORAL | 3 refills | Status: AC

## 2018-06-05 NOTE — Telephone Encounter (Signed)
New message    Pt c/o medication issue:  1. Name of Medication: amiodarone (PACERONE) 200 MG tablet  2. How are you currently taking this medication (dosage and times per day)? Take 1 tablet (200 mg total) by mouth 2 (two) times daily.  3. Are you having a reaction (difficulty breathing--STAT)? HR 61  4. What is your medication issue? Patient wants to discuss changing dosage

## 2018-06-05 NOTE — Telephone Encounter (Signed)
Spoke with patient who called with a question about her amiodarone dose. She states she is confused about what dose to take. She was seen by Dr. Acie Fredrickson on 7/19 and Orson Eva, NP on 7/30 who both advised patient to take amiodarone 100 mg twice daily. Patient states she has a pill bottle at home that has 100 mg tablets #90. She states she also has a bottle of 200 mg tablets but does not know the #. Patient's medication list has Amiodarone 200 mg twice daily. I advised patient that the most recent recommendations from both Dr. Acie Fredrickson and Roderic Palau, NP have the same dose of amiodarone 100 mg BID. I advised patient that I will update her medication list and that she will need a refill of her 100 mg tablets in 45 days since she is taking 2 tabs daily. Patient verbalized understanding and agreement and apologized for being confused. I advised that she has had multiple dose changes in recent months and thanked her for calling to clarify. She was grateful for my help.

## 2018-06-06 DIAGNOSIS — I2510 Atherosclerotic heart disease of native coronary artery without angina pectoris: Secondary | ICD-10-CM | POA: Diagnosis not present

## 2018-06-06 DIAGNOSIS — M069 Rheumatoid arthritis, unspecified: Secondary | ICD-10-CM | POA: Diagnosis not present

## 2018-06-06 DIAGNOSIS — I10 Essential (primary) hypertension: Secondary | ICD-10-CM | POA: Diagnosis not present

## 2018-06-06 DIAGNOSIS — I482 Chronic atrial fibrillation: Secondary | ICD-10-CM | POA: Diagnosis not present

## 2018-06-06 DIAGNOSIS — F325 Major depressive disorder, single episode, in full remission: Secondary | ICD-10-CM | POA: Diagnosis not present

## 2018-06-06 DIAGNOSIS — E7800 Pure hypercholesterolemia, unspecified: Secondary | ICD-10-CM | POA: Diagnosis not present

## 2018-06-06 DIAGNOSIS — M810 Age-related osteoporosis without current pathological fracture: Secondary | ICD-10-CM | POA: Diagnosis not present

## 2018-06-09 ENCOUNTER — Other Ambulatory Visit: Payer: Medicare Other | Admitting: *Deleted

## 2018-06-09 DIAGNOSIS — I4819 Other persistent atrial fibrillation: Secondary | ICD-10-CM

## 2018-06-09 DIAGNOSIS — R600 Localized edema: Secondary | ICD-10-CM | POA: Diagnosis not present

## 2018-06-09 DIAGNOSIS — I481 Persistent atrial fibrillation: Secondary | ICD-10-CM | POA: Diagnosis not present

## 2018-06-10 ENCOUNTER — Other Ambulatory Visit: Payer: Self-pay

## 2018-06-10 ENCOUNTER — Emergency Department (HOSPITAL_COMMUNITY)
Admission: EM | Admit: 2018-06-10 | Discharge: 2018-06-10 | Disposition: A | Discharge status: Home / Self Care | Payer: Medicare Other | Attending: Emergency Medicine | Admitting: Emergency Medicine

## 2018-06-10 ENCOUNTER — Encounter (HOSPITAL_COMMUNITY): Payer: Self-pay

## 2018-06-10 DIAGNOSIS — E46 Unspecified protein-calorie malnutrition: Secondary | ICD-10-CM

## 2018-06-10 DIAGNOSIS — J45909 Unspecified asthma, uncomplicated: Secondary | ICD-10-CM | POA: Diagnosis not present

## 2018-06-10 DIAGNOSIS — I259 Chronic ischemic heart disease, unspecified: Secondary | ICD-10-CM | POA: Insufficient documentation

## 2018-06-10 DIAGNOSIS — I4891 Unspecified atrial fibrillation: Secondary | ICD-10-CM | POA: Insufficient documentation

## 2018-06-10 DIAGNOSIS — M6281 Muscle weakness (generalized): Secondary | ICD-10-CM | POA: Diagnosis present

## 2018-06-10 DIAGNOSIS — R5383 Other fatigue: Secondary | ICD-10-CM | POA: Insufficient documentation

## 2018-06-10 DIAGNOSIS — I110 Hypertensive heart disease with heart failure: Secondary | ICD-10-CM | POA: Diagnosis not present

## 2018-06-10 DIAGNOSIS — I509 Heart failure, unspecified: Secondary | ICD-10-CM | POA: Diagnosis not present

## 2018-06-10 DIAGNOSIS — R5381 Other malaise: Secondary | ICD-10-CM | POA: Insufficient documentation

## 2018-06-10 LAB — BASIC METABOLIC PANEL
ANION GAP: 9 (ref 5–15)
BUN / CREAT RATIO: 27 (ref 12–28)
BUN: 13 mg/dL (ref 8–27)
BUN: 11 mg/dL (ref 8–23)
CALCIUM: 8.5 mg/dL — AB (ref 8.9–10.3)
CO2: 20 mmol/L (ref 20–29)
CO2: 24 mmol/L (ref 22–32)
CREATININE: 0.49 mg/dL — AB (ref 0.57–1.00)
Calcium: 8.9 mg/dL (ref 8.7–10.3)
Chloride: 97 mmol/L (ref 96–106)
Chloride: 98 mmol/L (ref 98–111)
Creatinine, Ser: 0.56 mg/dL (ref 0.44–1.00)
GFR calc Af Amer: 107 mL/min/{1.73_m2} (ref >59)
GFR, EST NON AFRICAN AMERICAN: 93 mL/min/{1.73_m2} (ref >59)
GLUCOSE: 91 mg/dL (ref 65–99)
Glucose, Bld: 107 mg/dL — ABNORMAL HIGH (ref 70–99)
POTASSIUM: 3.8 mmol/L (ref 3.5–5.1)
Potassium: 4.6 mmol/L (ref 3.5–5.2)
SODIUM: 135 mmol/L (ref 134–144)
Sodium: 131 mmol/L — ABNORMAL LOW (ref 135–145)

## 2018-06-10 LAB — URINALYSIS, ROUTINE W REFLEX MICROSCOPIC
Bilirubin Urine: NEGATIVE
Glucose, UA: NEGATIVE mg/dL
Ketones, ur: 5 mg/dL — AB
Nitrite: NEGATIVE
PROTEIN: NEGATIVE mg/dL
Specific Gravity, Urine: 1.011 (ref 1.005–1.030)
pH: 7 (ref 5.0–8.0)

## 2018-06-10 LAB — BRAIN NATRIURETIC PEPTIDE: B NATRIURETIC PEPTIDE 5: 127.6 pg/mL — AB (ref 0.0–100.0)

## 2018-06-10 LAB — CBC
HEMATOCRIT: 35.3 % — AB (ref 36.0–46.0)
HEMOGLOBIN: 11.9 g/dL — AB (ref 12.0–15.0)
MCH: 37 pg — ABNORMAL HIGH (ref 26.0–34.0)
MCHC: 33.7 g/dL (ref 30.0–36.0)
MCV: 109.6 fL — ABNORMAL HIGH (ref 78.0–100.0)
Platelets: 166 10*3/uL (ref 150–400)
RBC: 3.22 MIL/uL — AB (ref 3.87–5.11)
RDW: 13.2 % (ref 11.5–15.5)
WBC: 5.5 10*3/uL (ref 4.0–10.5)

## 2018-06-10 LAB — TSH: TSH: 1.614 u[IU]/mL (ref 0.350–4.500)

## 2018-06-10 LAB — TROPONIN I: Troponin I: <0.03 ng/mL (ref <0.03)

## 2018-06-10 MED ORDER — ONDANSETRON HCL 4 MG/2ML IJ SOLN
4.0000 mg | Freq: Once | INTRAMUSCULAR | Status: AC
  Administered 2018-06-10: 4 mg via INTRAVENOUS

## 2018-06-10 MED ORDER — SODIUM CHLORIDE 0.9 % IV BOLUS
500.0000 mL | Freq: Once | INTRAVENOUS | Status: AC
  Administered 2018-06-10: 500 mL via INTRAVENOUS

## 2018-06-10 NOTE — Discharge Instructions (Signed)
You were evaluated in the Emergency Department and after careful evaluation, we did not find any emergent condition requiring admission or further testing in the hospital.  Your symptoms today seem to be due to malnutrition related to your pyloric stenosis.  We are going to increase the support you have at home until your planned admission next week.  Please return to the Emergency Department if you experience any worsening of your condition.  We encourage you to follow up with a primary care provider.  Thank you for allowing [place] to be a part of your care.

## 2018-06-10 NOTE — Care Management (Signed)
ED CM contacted [facility] concerning adding additional disciplines [facility], SW services, with start of care 8/7.Referral faxed in via [facility], EDP updated. CM updated patient with information, patient is agreeable.

## 2018-06-10 NOTE — ED Triage Notes (Signed)
Pt states she has a port in her abdomen that takes food from her stomach to her intestines. She reports she is unable to tolerate PO due to being uncomfortable. Abdomen is rigid. She states she is here to have IV fluids due to being dehydrated.

## 2018-06-10 NOTE — ED Provider Notes (Signed)
[facility] Emergency Department Provider Note MRN:  [id number]  Arrival date & time: 06/10/18     Chief Complaint   Dehydration   History of Present Illness   Angela [name] is a 82 y.o. year-old female with a history of CAD, A. fib, CHF presenting to the ED with chief complaint of weakness.  For the past 1 to 2 weeks patient has been feeling progressively more generalized weakness, fatigue, malaise.  Has not been eating well due to significant nausea with meals.  Denies recent fevers, no vomiting, no chest pain or shortness of breath, no abdominal pain.  No dysuria.  No focal weakness or numbness of the arms and legs.  States that she feels as if she is dehydrated today.  Symptoms are moderate in severity, constant.  Review of Systems  A complete 10 system review of systems was obtained and all systems are negative except as noted in the HPI and PMH.   Patient's Health History    Past Medical History:  Diagnosis Date  . A-fib ([place])   . Acute renal failure ([place]) 06/15/2013  . Aortic stenosis    mild by echo 03/2012  . Asthma   . Atrial fibrillation ([place])   . Atrial flutter ([place]) 01/16/2016  . CAD (coronary artery disease)    s/p PCI with BMS to mid LAD--2/08 not on aspirin due to frequent ulcers.  Repeat cath for CP 09/2012 with patent LAD stent and otherwise normal coronary arteries  . Cardiomyopathy ([place]) 01/30/2016  . Cervical stenosis of spine   . Chronic pain syndrome 12/02/2017  . Depression   . Duodenitis 01/16/2016  . Dyslipidemia   . Elbow fracture, right    history of- never any surgery  . Essential hypertension, benign 11/03/2013  . Gastric outlet obstruction 01/06/2016  . Gastroparesis 01/06/2016  . GERD (gastroesophageal reflux disease)   . Headache   . Hypertension   . Hyponatremia 06/15/2013  . Impaired physical mobility    due to rheumatoid arthritis "ambulates with walker" limited free standing.  . Intestinal polyp   . Macular degeneration     . Obesity   . Osteoporosis    alendronate since 2012  . PUD (peptic ulcer disease)    can not take aspirin  . PVC's (premature ventricular contractions)   . RA (rheumatoid arthritis) ([place])   . Rheumatoid arthritis(714.0)    Dr. Lenna Gilford follows  . Spondylolisthesis of lumbar region   . SVT (supraventricular tachycardia) ([place]) 01/16/2016    Past Surgical History:  Procedure Laterality Date  . ANKLE FUSION Bilateral    x2 both  . BALLOON DILATION N/A 01/10/2018   Procedure: BALLOON DILATION;  Surgeon: [name], MD;  Location: [place];  Service: Gastroenterology;  Laterality: N/A;  . BALLOON DILATION N/A 01/24/2018   Procedure: Larrie Kass DILATION;  Surgeon: [name], MD;  Location: [place];  Service: Gastroenterology;  Laterality: N/A;  W/ Fluoroscopy. Please use colonoscope.  Marland Kitchen BALLOON DILATION N/A 04/15/2018   Procedure: BALLOON DILATION;  Surgeon: [name], MD;  Location: [facility];  Service: Gastroenterology;  Laterality: N/A;  . CARDIAC CATHETERIZATION  09/2012   patent LAD stent and otherwise normal coronary arteries  . CARDIAC CATHETERIZATION N/A 03/07/2016   Procedure: Left Heart Cath and Coronary Angiography;  Surgeon: Peter M Martinique, MD;  Location: [place] CV LAB;  Service: Cardiovascular;  Laterality: N/A;  . CARDIOVERSION N/A 03/13/2018   Procedure: CARDIOVERSION;  Surgeon: Lelon Perla, MD;  Location: [facility];  Service: Cardiovascular;  Laterality: N/A;  . CARDIOVERSION N/A 05/09/2018   Procedure: CARDIOVERSION;  Surgeon: Pixie Casino, MD;  Location: [facility] ENDOSCOPY;  Service: Cardiovascular;  Laterality: N/A;  . CATARACT EXTRACTION, BILATERAL Bilateral   . CERVICAL FUSION     limited  ROM  . CHOLECYSTECTOMY    . ESOPHAGOGASTRODUODENOSCOPY N/A 01/07/2016   Procedure: ESOPHAGOGASTRODUODENOSCOPY (EGD);  Surgeon: Wilford Corner, MD;  Location: [facility];  Service: Endoscopy;  Laterality: N/A;  . ESOPHAGOGASTRODUODENOSCOPY N/A 01/10/2018   Procedure:  ESOPHAGOGASTRODUODENOSCOPY (EGD);  Surgeon: [name], MD;  Location: [place];  Service: Gastroenterology;  Laterality: N/A;  . ESOPHAGOGASTRODUODENOSCOPY N/A 01/24/2018   Procedure: ESOPHAGOGASTRODUODENOSCOPY (EGD);  Surgeon: [name], MD;  Location: [place];  Service: Gastroenterology;  Laterality: N/A;  . ESOPHAGOGASTRODUODENOSCOPY N/A 04/15/2018   Procedure: ESOPHAGOGASTRODUODENOSCOPY (EGD);  Surgeon: [name], MD;  Location: [facility];  Service: Gastroenterology;  Laterality: N/A;  . ESOPHAGOGASTRODUODENOSCOPY (EGD) WITH PROPOFOL N/A 11/01/2015   Procedure: ESOPHAGOGASTRODUODENOSCOPY (EGD) WITH PROPOFOL w/ Dialtation;  Surgeon: Garlan Fair, MD;  Location: [facility] ENDOSCOPY;  Service: Endoscopy;  Laterality: N/A;  . FRACTURE SURGERY  09/2011   left ankle screw and pin removed  . HAND SURGERY Bilateral    x4 digits 2-5 both hands  . JOINT REPLACEMENT Bilateral    BTKA  . TONSILLECTOMY      Family History  Problem Relation Age of Onset  . Lung cancer Mother   . Arrhythmia Mother   . Melanoma Brother   . Heart attack Neg Hx     Social History   Socioeconomic History  . Marital status: Widowed    Spouse name: Not on file  . Number of children: Not on file  . Years of education: Not on file  . Highest education level: Not on file  Occupational History  . Not on file  Social Needs  . Financial resource strain: Not on file  . Food insecurity:    Worry: Not on file    Inability: Not on file  . Transportation needs:    Medical: Not on file    Non-medical: Not on file  Tobacco Use  . Smoking status: Never Smoker  . Smokeless tobacco: Never Used  Substance and Sexual Activity  . Alcohol use: No  . Drug use: No  . Sexual activity: Not on file  Lifestyle  . Physical activity:    Days per week: Not on file    Minutes per session: Not on file  . Stress: Not on file  Relationships  . Social connections:    Talks on phone: Not on file    Gets together: Not  on file    Attends religious service: Not on file    Active member of club or organization: Not on file    Attends meetings of clubs or organizations: Not on file    Relationship status: Not on file  . Intimate partner violence:    Fear of current or ex partner: Not on file    Emotionally abused: Not on file    Physically abused: Not on file    Forced sexual activity: Not on file  Other Topics Concern  . Not on file  Social History Narrative  . Not on file     Physical Exam  Vital Signs and Nursing Notes reviewed Vitals:   06/10/18 1615 06/10/18 1715  BP: 120/65 (!) 112/59  Pulse: 62 60  Resp: 17 18  Temp:    SpO2: 100% 98%    CONSTITUTIONAL:  Elderly-appearing, NAD NEURO:  Alert and oriented x 3, no focal deficits EYES:  eyes equal and reactive ENT/NECK:  no LAD, no JVD CARDIO: Regular rate, well-perfused, normal S1 and S2 PULM:  CTAB no wheezing or rhonchi GI/GU:  normal bowel sounds, non-distended, non-tender MSK/SPINE:  No gross deformities, no edema SKIN:  no rash, atraumatic PSYCH:  Appropriate speech and behavior  Diagnostic and Interventional Summary    EKG Interpretation  Date/Time:    Ventricular Rate:    PR Interval:    QRS Duration:   QT Interval:    QTC Calculation:   R Axis:     Text Interpretation:        Labs Reviewed  BASIC METABOLIC PANEL - Abnormal; Notable for the following components:      Result Value   Sodium 131 (*)    Glucose, Bld 107 (*)    Calcium 8.5 (*)    All other components within normal limits  CBC - Abnormal; Notable for the following components:   RBC 3.22 (*)    Hemoglobin 11.9 (*)    HCT 35.3 (*)    MCV 109.6 (*)    MCH 37.0 (*)    All other components within normal limits  BRAIN NATRIURETIC PEPTIDE - Abnormal; Notable for the following components:   B Natriuretic Peptide 127.6 (*)    All other components within normal limits  URINALYSIS, ROUTINE W REFLEX MICROSCOPIC - Abnormal; Notable for the following  components:   APPearance CLOUDY (*)    Hgb urine dipstick SMALL (*)    Ketones, ur 5 (*)    Leukocytes, UA TRACE (*)    Bacteria, UA RARE (*)    All other components within normal limits  TROPONIN I  TSH    No orders to display    Medications  sodium chloride 0.9 % bolus 500 mL (0 mLs Intravenous Stopped 06/10/18 1530)  ondansetron (ZOFRAN) injection 4 mg (4 mg Intravenous Given 06/10/18 1424)     Procedures Critical Care  ED Course and Medical Decision Making  I have reviewed the triage vital signs and the nursing notes.  Pertinent labs & imaging results that were available during my care of the patient were reviewed by me and considered in my medical decision making (see below for details). Clinical Course as of Jun 10 2249  Tue Jun 10, 7668  1837 82 year old female with a history of pyloric stenosis requiring frequent dilations via EGD presenting with feeling of dehydration, poor p.o. intake, general weakness and fatigue for the past 1 to 2 weeks.  Vital signs stable, appears tired but in no acute distress.  Abdomen soft and nontender.  Some increased lower extremity edema.  Favoring malnutrition and dehydration, will give half liter bolus and reassess.   [MB]  1534 Work-up unremarkable, patient feeling slightly better with half liter normal saline.  But the main issue seems to be malnutrition, patient has lost 30 to 40 pounds this year related to her pyloric stenosis.  Has a planned admission 1 week from today, but has very little assistance at home, lives alone.  Will consult social work/case management to see if this home health care can be augmented.   [MB]  1701 Patient is feeling better and requesting discharge.  Tolerating p.o. water and crackers in the ED.  Discussed case with social work and case management, will augment home health.  Patient's daughter will stay with her for the next 2 nights.After the discussed management above, the patient was determined  to be safe for  discharge.  The patient was in agreement with this plan and all questions regarding their care were answered.  ED return precautions were discussed and the patient will return to the ED with any significant worsening of condition.   [MB]    Clinical Course User Index [MB] Sedonia Small Barth Kirks, MD     Barth Kirks. Sedonia Small, [place] [email or web address]  Final Clinical Impressions(s) / ED Diagnoses     ICD-10-CM   1. Malaise and fatigue R53.81    R53.83   2. Malnutrition, unspecified type [facility]) E46     ED Discharge Orders    None         Maudie Flakes, MD 06/10/18 2250

## 2018-06-10 NOTE — ED Notes (Signed)
SW at bedside.

## 2018-06-10 NOTE — ED Notes (Signed)
Got patient into a gown on the monitor patient is resting with call bell in reach and family at bedside ?

## 2018-06-10 NOTE — ED Notes (Signed)
Patient verbalizes understanding of discharge instructions. Opportunity for questioning and answers were provided. Armband removed by staff, pt discharged from ED in wheelchair with family.  

## 2018-06-12 DIAGNOSIS — M810 Age-related osteoporosis without current pathological fracture: Secondary | ICD-10-CM | POA: Diagnosis not present

## 2018-06-12 DIAGNOSIS — I2510 Atherosclerotic heart disease of native coronary artery without angina pectoris: Secondary | ICD-10-CM | POA: Diagnosis not present

## 2018-06-12 DIAGNOSIS — R296 Repeated falls: Secondary | ICD-10-CM | POA: Diagnosis not present

## 2018-06-12 DIAGNOSIS — I482 Chronic atrial fibrillation: Secondary | ICD-10-CM | POA: Diagnosis not present

## 2018-06-12 DIAGNOSIS — M069 Rheumatoid arthritis, unspecified: Secondary | ICD-10-CM | POA: Diagnosis not present

## 2018-06-12 DIAGNOSIS — K3184 Gastroparesis: Secondary | ICD-10-CM | POA: Diagnosis not present

## 2018-06-15 ENCOUNTER — Other Ambulatory Visit: Payer: Self-pay | Admitting: Physical Medicine & Rehabilitation

## 2018-06-15 DIAGNOSIS — F329 Major depressive disorder, single episode, unspecified: Secondary | ICD-10-CM

## 2018-06-17 ENCOUNTER — Encounter (HOSPITAL_COMMUNITY)
Admission: RE | Disposition: A | Discharge status: Home / Self Care | Payer: Self-pay | Source: Ambulatory Visit | Attending: Gastroenterology

## 2018-06-17 ENCOUNTER — Encounter (HOSPITAL_COMMUNITY): Payer: Self-pay | Admitting: *Deleted

## 2018-06-17 ENCOUNTER — Ambulatory Visit (HOSPITAL_COMMUNITY)
Admission: RE | Admit: 2018-06-17 | Discharge: 2018-06-17 | Disposition: A | Discharge status: Home / Self Care | Payer: Medicare Other | Source: Ambulatory Visit | Attending: Gastroenterology | Admitting: Gastroenterology

## 2018-06-17 ENCOUNTER — Ambulatory Visit (HOSPITAL_COMMUNITY): Payer: Medicare Other | Admitting: Anesthesiology

## 2018-06-17 ENCOUNTER — Other Ambulatory Visit: Payer: Self-pay

## 2018-06-17 DIAGNOSIS — K3189 Other diseases of stomach and duodenum: Secondary | ICD-10-CM | POA: Diagnosis not present

## 2018-06-17 DIAGNOSIS — R54 Age-related physical debility: Secondary | ICD-10-CM | POA: Diagnosis not present

## 2018-06-17 DIAGNOSIS — Z955 Presence of coronary angioplasty implant and graft: Secondary | ICD-10-CM | POA: Diagnosis not present

## 2018-06-17 DIAGNOSIS — I509 Heart failure, unspecified: Secondary | ICD-10-CM | POA: Insufficient documentation

## 2018-06-17 DIAGNOSIS — K311 Adult hypertrophic pyloric stenosis: Secondary | ICD-10-CM | POA: Diagnosis not present

## 2018-06-17 DIAGNOSIS — Z79899 Other long term (current) drug therapy: Secondary | ICD-10-CM | POA: Diagnosis not present

## 2018-06-17 DIAGNOSIS — M1990 Unspecified osteoarthritis, unspecified site: Secondary | ICD-10-CM | POA: Insufficient documentation

## 2018-06-17 DIAGNOSIS — K228 Other specified diseases of esophagus: Secondary | ICD-10-CM | POA: Diagnosis not present

## 2018-06-17 DIAGNOSIS — K219 Gastro-esophageal reflux disease without esophagitis: Secondary | ICD-10-CM | POA: Diagnosis not present

## 2018-06-17 DIAGNOSIS — Z7901 Long term (current) use of anticoagulants: Secondary | ICD-10-CM | POA: Diagnosis not present

## 2018-06-17 DIAGNOSIS — F329 Major depressive disorder, single episode, unspecified: Secondary | ICD-10-CM | POA: Diagnosis not present

## 2018-06-17 DIAGNOSIS — I2510 Atherosclerotic heart disease of native coronary artery without angina pectoris: Secondary | ICD-10-CM | POA: Diagnosis not present

## 2018-06-17 DIAGNOSIS — K210 Gastro-esophageal reflux disease with esophagitis: Secondary | ICD-10-CM | POA: Diagnosis not present

## 2018-06-17 DIAGNOSIS — I4891 Unspecified atrial fibrillation: Secondary | ICD-10-CM | POA: Diagnosis not present

## 2018-06-17 DIAGNOSIS — E785 Hyperlipidemia, unspecified: Secondary | ICD-10-CM | POA: Diagnosis not present

## 2018-06-17 DIAGNOSIS — I110 Hypertensive heart disease with heart failure: Secondary | ICD-10-CM | POA: Diagnosis not present

## 2018-06-17 HISTORY — PX: ESOPHAGOGASTRODUODENOSCOPY: SHX5428

## 2018-06-17 HISTORY — PX: BIOPSY: SHX5522

## 2018-06-17 HISTORY — PX: BALLOON DILATION: SHX5330

## 2018-06-17 SURGERY — EGD (ESOPHAGOGASTRODUODENOSCOPY)
Anesthesia: Monitor Anesthesia Care

## 2018-06-17 MED ORDER — LACTATED RINGERS IV SOLN
INTRAVENOUS | Status: DC
  Administered 2018-06-17: 10:00:00 via INTRAVENOUS

## 2018-06-17 MED ORDER — PROPOFOL 500 MG/50ML IV EMUL
INTRAVENOUS | Status: DC | PRN
  Administered 2018-06-17: 100 ug/kg/min via INTRAVENOUS

## 2018-06-17 MED ORDER — SODIUM CHLORIDE 0.9 % IV SOLN: INTRAVENOUS | Status: DC

## 2018-06-17 MED ORDER — PROPOFOL 10 MG/ML IV BOLUS
INTRAVENOUS | Status: AC
  Filled 2018-06-17: qty 40

## 2018-06-17 MED ORDER — PROPOFOL 10 MG/ML IV BOLUS
INTRAVENOUS | Status: DC | PRN
  Administered 2018-06-17: 10 mg via INTRAVENOUS

## 2018-06-17 MED ORDER — LIDOCAINE 2% (20 MG/ML) 5 ML SYRINGE
INTRAMUSCULAR | Status: DC | PRN
  Administered 2018-06-17: 25 mg via INTRAVENOUS
  Administered 2018-06-17: 75 mg via INTRAVENOUS

## 2018-06-17 NOTE — Anesthesia Preprocedure Evaluation (Addendum)
Anesthesia Evaluation  Patient identified by MRN, date of birth, ID band Patient awake    Reviewed: Allergy & Precautions, NPO status , Patient's Chart, lab work & pertinent test results  Airway Mallampati: III  TM Distance: >3 FB Neck ROM: Full    Dental no notable dental hx. (+)    Pulmonary    Pulmonary exam normal breath sounds clear to auscultation       Cardiovascular hypertension, + CAD, + Cardiac Stents and +CHF  Normal cardiovascular exam+ dysrhythmias Atrial Fibrillation + Valvular Problems/Murmurs AS  Rhythm:Regular Rate:Normal  ECG: SB, rate 57  CATH: Prox LAD to Mid LAD lesion, 10% stenosed. The lesion was previously treated with a bare metal stent greater than two years ago. The left ventricular systolic function is normal.   1. No obstructive CAD. Stent in the mid LAD is widely patent. 2. Good overall LV function. There is focal hypokinesis of the basal inferior wall that has been present since cardiac cath in 2005.   Sees cardiologist Agricultural engineer)   Neuro/Psych  Headaches, PSYCHIATRIC DISORDERS Depression    GI/Hepatic Neg liver ROS, PUD, GERD  Medicated and Controlled,  Endo/Other  negative endocrine ROS  Renal/GU negative Renal ROS     Musculoskeletal  (+) Arthritis , Rheumatoid disorders,    Abdominal   Peds  Hematology  (+) anemia , HLD   Anesthesia Other Findings History of pyloric stenosis  Reproductive/Obstetrics                            Anesthesia Physical Anesthesia Plan  ASA: III  Anesthesia Plan: MAC   Post-op Pain Management:    Induction: Intravenous  PONV Risk Score and Plan: 2 and Propofol infusion, Treatment may vary due to age or medical condition and TIVA  Airway Management Planned: Nasal Cannula  Additional Equipment:   Intra-op Plan:   Post-operative Plan:   Informed Consent: I have reviewed the patients History and Physical, chart, labs  and discussed the procedure including the risks, benefits and alternatives for the proposed anesthesia with the patient or authorized representative who has indicated his/her understanding and acceptance.   Dental advisory given  Plan Discussed with: CRNA  Anesthesia Plan Comments:         Anesthesia Quick Evaluation

## 2018-06-17 NOTE — Op Note (Signed)
[facility] Patient Name: Angela [name] Procedure Date: 06/17/2018 MRN: [id number] Attending MD: [name] , MD Date of Birth: 08-04-1936 CSN: [id number] Age: 82 Admit Type: Outpatient Procedure:                Upper GI endoscopy Indications:              For therapy of pyloric stenosis Providers:                [name], MD, Elmer Ramp. Tilden Dome, RN, Cletis Athens,                            Technician Referring MD:              Medicines:                Monitored Anesthesia Care Complications:            No immediate complications. Estimated blood loss:                            None. Estimated Blood Loss:     Estimated blood loss was minimal. Procedure:                Pre-Anesthesia Assessment:                           - Prior to the procedure, a History and Physical                            was performed, and patient medications and                            allergies were reviewed. The patient's tolerance of                            previous anesthesia was also reviewed. The risks                            and benefits of the procedure and the sedation                            options and risks were discussed with the patient.                            All questions were answered, and informed consent                            was obtained. Prior Anticoagulants: The patient has                            taken Eliquis (apixaban), last dose was 1 day prior                            to procedure. ASA Grade Assessment: III - A patient  with severe systemic disease. After reviewing the                            risks and benefits, the patient was deemed in                            satisfactory condition to undergo the procedure.                           After obtaining informed consent, the endoscope was                            passed under direct vision. Throughout the                            procedure, the patient's  blood pressure, pulse, and                            oxygen saturations were monitored continuously. The                            PCF-[id number] ([id number] Olympus peds colonoscope was                            introduced through the mouth, and advanced to the                            second part of duodenum. The upper GI endoscopy was                            accomplished without difficulty. The patient                            tolerated the procedure well. Scope In: Scope Out: Findings:      There were esophageal mucosal changes suspicious for short-segment       Barrett's esophagus present in the distal esophagus. The maximum       longitudinal extent of these mucosal changes was 3 cm in length. Mucosa       was biopsied with a cold forceps for histology from 36 to 39 cm from the       incisors. One specimen bottle was sent to pathology.      A large amount of food (residue) was found in the gastric body.      A deformity was found at the pylorus and pyloric channel. A TTS dilator       was passed through the scope. Dilation with a 18 mm for 1 minute and a       20 mm pyloric balloon dilator for 1 minute was performed. The dilation       site was examined following endoscope reinsertion and showed moderate       improvement in luminal narrowing. Estimated blood loss was minimal.      The cardia and gastric fundus were normal on retroflexion. Impression:               - Esophageal mucosal changes suspicious  for                            short-segment Barrett's esophagus. Biopsied.                           - A large amount of food (residue) in the stomach.                           - Acquired deformity in the pylorus. Dilated. Moderate Sedation:      Patient did not receive moderate sedation for this procedure, but       instead received monitored anesthesia care. Recommendation:           - Patient has a contact number available for                            emergencies. The  signs and symptoms of potential                            delayed complications were discussed with the                            patient. Return to normal activities tomorrow.                            Written discharge instructions were provided to the                            patient.                           - Low fiber diet.                           - Continue present medications.                           - Await pathology results.                           - Perform an upper GI series and small bowel follow                            through at appointment to be scheduled.                           - Discussed about surgical J tube placement for                            feeding if patient remains malnourised. Procedure Code(s):        --- Professional ---                           [phone], Esophagogastroduodenoscopy, flexible,  transoral; with dilation of gastric/duodenal                            stricture(s) (eg, balloon, bougie)                           43239, Esophagogastroduodenoscopy, flexible,                            transoral; with biopsy, single or multiple Diagnosis Code(s):        --- Professional ---                           K22.8, Other specified diseases of esophagus                           K31.89, Other diseases of stomach and duodenum                           K31.1, Adult hypertrophic pyloric stenosis CPT copyright 2017 American Medical Association. All rights reserved. The codes documented in this report are preliminary and upon coder review may  be revised to meet current compliance requirements. [name], MD 06/17/2018 11:33:56 AM This report has been signed electronically. Number of Addenda: 0

## 2018-06-17 NOTE — Anesthesia Procedure Notes (Signed)
Procedure Name: MAC Date/Time: 06/17/2018 11:08 AM Performed by: Lollie Sails, CRNA Pre-anesthesia Checklist: Patient identified, Emergency Drugs available, Suction available and Patient being monitored Patient Re-evaluated:Patient Re-evaluated prior to induction Oxygen Delivery Method: Nasal cannula

## 2018-06-17 NOTE — Brief Op Note (Signed)
06/17/2018  11:34 AM  PATIENT:  Angela [name]  83 y.o. female  PRE-OPERATIVE DIAGNOSIS:  History of pyloric stenosis  POST-OPERATIVE DIAGNOSIS:  pyloric stenosis, balloon dilation  PROCEDURE:  Procedure(s): ESOPHAGOGASTRODUODENOSCOPY (EGD) (N/A) BIOPSY BALLOON DILATION (N/A)  SURGEON:  Surgeon(s) and Role:    [name], MD - Primary  PHYSICIAN ASSISTANT: Tory Emerald, RN, Hope Parker, Tech  ASSISTANTS: none   ANESTHESIA:   MAC  EBL:  0 mL   BLOOD ADMINISTERED:none  DRAINS: none   LOCAL MEDICATIONS USED:  NONE  SPECIMEN:  Biopsy / Limited Resection  DISPOSITION OF SPECIMEN:  PATHOLOGY  COUNTS:  YES  TOURNIQUET:  * No tourniquets in log *  DICTATION: .Dragon Dictation  PLAN OF CARE: Discharge to home after PACU  PATIENT DISPOSITION:  PACU - hemodynamically stable.   Delay start of Pharmacological VTE agent (>24hrs) due to surgical blood loss or risk of bleeding: no

## 2018-06-17 NOTE — Anesthesia Postprocedure Evaluation (Signed)
Anesthesia Post Note  Patient: LAMYIAH CRAWSHAW  Procedure(s) Performed: ESOPHAGOGASTRODUODENOSCOPY (EGD) (N/A ) BIOPSY BALLOON DILATION (N/A )     Patient location during evaluation: PACU Anesthesia Type: MAC Level of consciousness: awake and alert Pain management: pain level controlled Vital Signs Assessment: post-procedure vital signs reviewed and stable Respiratory status: spontaneous breathing, nonlabored ventilation, respiratory function stable and patient connected to nasal cannula oxygen Cardiovascular status: stable and blood pressure returned to baseline Postop Assessment: no apparent nausea or vomiting Anesthetic complications: no    Last Vitals:  Vitals:   06/17/18 1150 06/17/18 1155  BP: (!) 102/52 (!) 109/47  Pulse: (!) 58 (!) 57  Resp: 16 15  Temp: 36.4 C   SpO2: 99% 100%    Last Pain:  Vitals:   06/17/18 1150  TempSrc: Oral  PainSc: 4                  Ryan P Ellender

## 2018-06-17 NOTE — Transfer of Care (Signed)
Immediate Anesthesia Transfer of Care Note  Patient: Angela [name]  Procedure(s) Performed: ESOPHAGOGASTRODUODENOSCOPY (EGD) (N/A ) BIOPSY BALLOON DILATION (N/A )  Patient Location: PACU  Anesthesia Type:MAC  Level of Consciousness: sedated  Airway & Oxygen Therapy: Patient Spontanous Breathing and Patient connected to nasal cannula oxygen  Post-op Assessment: Report given to RN and Post -op Vital signs reviewed and stable  Post vital signs: Reviewed and stable  Last Vitals:  Vitals Value Taken Time  BP 79/36 06/17/2018 11:34 AM  Temp    Pulse    Resp 20 06/17/2018 11:35 AM  SpO2    Vitals shown include unvalidated device data.  Last Pain:  Vitals:   06/17/18 1005  TempSrc: Oral  PainSc: 0-No pain         Complications: No apparent anesthesia complications

## 2018-06-17 NOTE — Discharge Instructions (Signed)

## 2018-06-17 NOTE — H&P (Signed)
82-year-old female had a repeat EGD with dilatation of pyloric channel with a 15 mm balloon and postdilatation I was able to advance a pediatric colonoscope into the duodenum. I had recommended a repeat procedure in 6 weeks as she has significant stenosis and pyloric channel deformity( had residual solid food in gastric cavity despite being on clear liquid diet for 2 days for the procedure).  Has gained 2 pounds since last visit and is able to tolerate more kinds of food including meats. She has more regular bowel movements and states that early satiety has improved to a certain extent and doesn't have as much abdominal discomfort as before.  Weight 110.6 pounds [place] 61.5 inches bMI 20.56  Frail appearing, appears his stated age, pleasant, no acute distress Anicteric Obvious deformities from arthritis Normal affect and mood  Assessment Pyloric stenosis  Plan Repeat EGD with pediatric colonoscope and balloon dilatation to be performed on a scheduled basis every 6 weeks until the pylorus gets more open and patent. Patient will be advised to hold Eliquis for 24 hours prior to procedure for each of such scheduled EGD with dilatation, and will need to be on clear liquid diet for a day before the procedure.   [name], M.D.

## 2018-06-18 DIAGNOSIS — R296 Repeated falls: Secondary | ICD-10-CM | POA: Diagnosis not present

## 2018-06-18 DIAGNOSIS — M069 Rheumatoid arthritis, unspecified: Secondary | ICD-10-CM | POA: Diagnosis not present

## 2018-06-18 DIAGNOSIS — I2510 Atherosclerotic heart disease of native coronary artery without angina pectoris: Secondary | ICD-10-CM | POA: Diagnosis not present

## 2018-06-18 DIAGNOSIS — M810 Age-related osteoporosis without current pathological fracture: Secondary | ICD-10-CM | POA: Diagnosis not present

## 2018-06-18 DIAGNOSIS — K3184 Gastroparesis: Secondary | ICD-10-CM | POA: Diagnosis not present

## 2018-06-18 DIAGNOSIS — I482 Chronic atrial fibrillation: Secondary | ICD-10-CM | POA: Diagnosis not present

## 2018-06-24 DIAGNOSIS — K3184 Gastroparesis: Secondary | ICD-10-CM | POA: Diagnosis not present

## 2018-06-24 DIAGNOSIS — E43 Unspecified severe protein-calorie malnutrition: Secondary | ICD-10-CM | POA: Diagnosis not present

## 2018-06-26 ENCOUNTER — Other Ambulatory Visit: Payer: Self-pay | Admitting: Gastroenterology

## 2018-06-26 DIAGNOSIS — R109 Unspecified abdominal pain: Secondary | ICD-10-CM

## 2018-06-26 DIAGNOSIS — I2510 Atherosclerotic heart disease of native coronary artery without angina pectoris: Secondary | ICD-10-CM | POA: Diagnosis not present

## 2018-06-26 DIAGNOSIS — M810 Age-related osteoporosis without current pathological fracture: Secondary | ICD-10-CM | POA: Diagnosis not present

## 2018-06-26 DIAGNOSIS — K3184 Gastroparesis: Secondary | ICD-10-CM | POA: Diagnosis not present

## 2018-06-26 DIAGNOSIS — I482 Chronic atrial fibrillation: Secondary | ICD-10-CM | POA: Diagnosis not present

## 2018-06-26 DIAGNOSIS — K311 Adult hypertrophic pyloric stenosis: Secondary | ICD-10-CM

## 2018-06-26 DIAGNOSIS — R296 Repeated falls: Secondary | ICD-10-CM | POA: Diagnosis not present

## 2018-06-26 DIAGNOSIS — M069 Rheumatoid arthritis, unspecified: Secondary | ICD-10-CM | POA: Diagnosis not present

## 2018-06-27 ENCOUNTER — Other Ambulatory Visit: Payer: Self-pay

## 2018-06-27 NOTE — Patient Outreach (Signed)
[facility]) Care Management  06/27/2018  Angela [name] 1936-07-10 [id number]  TELEPHONE SCREENING Referral date: 06/24/18 Referral source: physician referral Referral reason: community resources.  Insurance: Medicare  Telephone call to patient regarding physician referral. HIPAA verified with patient. Explained reason for call. Patient states she recently reviewed her health care power of attorney and noted her husband and daughter are listed.  Patient states both her daughter and husband passed away in the past 4 years.  Patient state her other daughter is sick with COPD and is not able to provide care for her.  Patient states she would like to have her health care power of attorney redone.  Patient reports she lives alone. She states she has a caregiver, Burr Medico from 6pm to 9pm Monday thru Friday.  Patient states her caregiver is a retired IT trainer. Patient states she is at home during the day and on the weekends by herself. Patient states she would like to speak with someone regarding transportation assistance and additional caregiver assistance.  Patient states she currently has [facility] providing therapy exercises.  Patient denies having home health nurse.   Patient states she has been in the emergency room 2 times within the past  30 days due to dehydration. Patient states she also has malnutrition.  She states she has had 4 EGD'S in the last 5 months .  Patient states the area where her stomach and intestines meet does not close up properly.  She states her gastroenterologist has her scheduled to have another EGD to make a decision on whether she will have a feeding tube.  Patient reports she has a lot of swelling in her ankles.  Patient states she has atrial fibrillation and has had to have several cardioversion's. Patient states her heart rhythm has finally stabilized. Patient reports she has had approximately 3 falls over the last 3 months. She states  she has sustained bruising but no serious injury.  Patient states she tries to be really careful when she is using her walker due to her being weak and feeling dizzy.  Patient reports she is on quite a few medications. She states her caregiver fills her pill box weekly.   Patient reports having some symptoms of depression. She states  With all of her stomach problems and her severe rheumatoid arthritis it has been difficulty for her to be independent.  RNCM discussed and offered [facility] care management services to patient. Patient verbally agreed. Patient given contact name and telephone number for [facility] care management.   ASSESSMENT; Completed PHQ 2 - 6,  PHQ 9 - 12.  Patient will benefit from referral to social worker, community case Engineer, maintenance.     PLAN: RNCm will refer patient to community case manager, Education officer, museum, and pharmacist.   Quinn Plowman RN,BSN,CCM [facility] Telephonic  [phone]

## 2018-06-29 ENCOUNTER — Emergency Department (HOSPITAL_COMMUNITY): Payer: Medicare Other

## 2018-06-29 ENCOUNTER — Encounter (HOSPITAL_COMMUNITY): Payer: Self-pay

## 2018-06-29 ENCOUNTER — Other Ambulatory Visit: Payer: Self-pay | Admitting: Cardiology

## 2018-06-29 ENCOUNTER — Inpatient Hospital Stay (HOSPITAL_COMMUNITY)
Admission: EM | Admit: 2018-06-29 | Discharge: 2018-08-05 | DRG: 853 | Disposition: E | Payer: Medicare Other | Attending: Internal Medicine | Admitting: Internal Medicine

## 2018-06-29 ENCOUNTER — Other Ambulatory Visit: Payer: Self-pay

## 2018-06-29 DIAGNOSIS — K279 Peptic ulcer, site unspecified, unspecified as acute or chronic, without hemorrhage or perforation: Secondary | ICD-10-CM | POA: Diagnosis present

## 2018-06-29 DIAGNOSIS — Z66 Do not resuscitate: Secondary | ICD-10-CM | POA: Diagnosis not present

## 2018-06-29 DIAGNOSIS — E785 Hyperlipidemia, unspecified: Secondary | ICD-10-CM | POA: Diagnosis present

## 2018-06-29 DIAGNOSIS — K560 Paralytic ileus: Secondary | ICD-10-CM | POA: Diagnosis not present

## 2018-06-29 DIAGNOSIS — I2510 Atherosclerotic heart disease of native coronary artery without angina pectoris: Secondary | ICD-10-CM | POA: Diagnosis present

## 2018-06-29 DIAGNOSIS — K567 Ileus, unspecified: Secondary | ICD-10-CM

## 2018-06-29 DIAGNOSIS — M4712 Other spondylosis with myelopathy, cervical region: Secondary | ICD-10-CM | POA: Diagnosis present

## 2018-06-29 DIAGNOSIS — R110 Nausea: Secondary | ICD-10-CM | POA: Diagnosis not present

## 2018-06-29 DIAGNOSIS — K9423 Gastrostomy malfunction: Secondary | ICD-10-CM | POA: Diagnosis not present

## 2018-06-29 DIAGNOSIS — I10 Essential (primary) hypertension: Secondary | ICD-10-CM | POA: Diagnosis present

## 2018-06-29 DIAGNOSIS — Z881 Allergy status to other antibiotic agents status: Secondary | ICD-10-CM

## 2018-06-29 DIAGNOSIS — E860 Dehydration: Secondary | ICD-10-CM | POA: Diagnosis not present

## 2018-06-29 DIAGNOSIS — Z955 Presence of coronary angioplasty implant and graft: Secondary | ICD-10-CM

## 2018-06-29 DIAGNOSIS — J690 Pneumonitis due to inhalation of food and vomit: Secondary | ICD-10-CM | POA: Diagnosis not present

## 2018-06-29 DIAGNOSIS — J9811 Atelectasis: Secondary | ICD-10-CM | POA: Diagnosis present

## 2018-06-29 DIAGNOSIS — Z79899 Other long term (current) drug therapy: Secondary | ICD-10-CM

## 2018-06-29 DIAGNOSIS — E872 Acidosis: Secondary | ICD-10-CM | POA: Diagnosis present

## 2018-06-29 DIAGNOSIS — E2740 Unspecified adrenocortical insufficiency: Secondary | ICD-10-CM | POA: Diagnosis present

## 2018-06-29 DIAGNOSIS — K5901 Slow transit constipation: Secondary | ICD-10-CM | POA: Diagnosis not present

## 2018-06-29 DIAGNOSIS — K311 Adult hypertrophic pyloric stenosis: Secondary | ICD-10-CM

## 2018-06-29 DIAGNOSIS — Z91018 Allergy to other foods: Secondary | ICD-10-CM

## 2018-06-29 DIAGNOSIS — Z9842 Cataract extraction status, left eye: Secondary | ICD-10-CM

## 2018-06-29 DIAGNOSIS — R627 Adult failure to thrive: Secondary | ICD-10-CM | POA: Diagnosis present

## 2018-06-29 DIAGNOSIS — M5136 Other intervertebral disc degeneration, lumbar region: Secondary | ICD-10-CM | POA: Diagnosis present

## 2018-06-29 DIAGNOSIS — Z888 Allergy status to other drugs, medicaments and biological substances status: Secondary | ICD-10-CM

## 2018-06-29 DIAGNOSIS — J45909 Unspecified asthma, uncomplicated: Secondary | ICD-10-CM | POA: Diagnosis present

## 2018-06-29 DIAGNOSIS — R571 Hypovolemic shock: Secondary | ICD-10-CM | POA: Diagnosis present

## 2018-06-29 DIAGNOSIS — Z91013 Allergy to seafood: Secondary | ICD-10-CM

## 2018-06-29 DIAGNOSIS — M4310 Spondylolisthesis, site unspecified: Secondary | ICD-10-CM | POA: Diagnosis present

## 2018-06-29 DIAGNOSIS — R1084 Generalized abdominal pain: Secondary | ICD-10-CM

## 2018-06-29 DIAGNOSIS — M1990 Unspecified osteoarthritis, unspecified site: Secondary | ICD-10-CM | POA: Diagnosis present

## 2018-06-29 DIAGNOSIS — Z9049 Acquired absence of other specified parts of digestive tract: Secondary | ICD-10-CM

## 2018-06-29 DIAGNOSIS — D62 Acute posthemorrhagic anemia: Secondary | ICD-10-CM | POA: Diagnosis present

## 2018-06-29 DIAGNOSIS — R140 Abdominal distension (gaseous): Secondary | ICD-10-CM

## 2018-06-29 DIAGNOSIS — I429 Cardiomyopathy, unspecified: Secondary | ICD-10-CM

## 2018-06-29 DIAGNOSIS — I471 Supraventricular tachycardia: Secondary | ICD-10-CM | POA: Diagnosis present

## 2018-06-29 DIAGNOSIS — Z808 Family history of malignant neoplasm of other organs or systems: Secondary | ICD-10-CM

## 2018-06-29 DIAGNOSIS — E43 Unspecified severe protein-calorie malnutrition: Secondary | ICD-10-CM | POA: Diagnosis not present

## 2018-06-29 DIAGNOSIS — M069 Rheumatoid arthritis, unspecified: Secondary | ICD-10-CM | POA: Diagnosis present

## 2018-06-29 DIAGNOSIS — Z515 Encounter for palliative care: Secondary | ICD-10-CM | POA: Diagnosis present

## 2018-06-29 DIAGNOSIS — Z9841 Cataract extraction status, right eye: Secondary | ICD-10-CM

## 2018-06-29 DIAGNOSIS — I110 Hypertensive heart disease with heart failure: Secondary | ICD-10-CM | POA: Diagnosis present

## 2018-06-29 DIAGNOSIS — H3530 Unspecified macular degeneration: Secondary | ICD-10-CM | POA: Diagnosis present

## 2018-06-29 DIAGNOSIS — I5042 Chronic combined systolic (congestive) and diastolic (congestive) heart failure: Secondary | ICD-10-CM | POA: Diagnosis present

## 2018-06-29 DIAGNOSIS — J9601 Acute respiratory failure with hypoxia: Secondary | ICD-10-CM | POA: Diagnosis present

## 2018-06-29 DIAGNOSIS — I350 Nonrheumatic aortic (valve) stenosis: Secondary | ICD-10-CM

## 2018-06-29 DIAGNOSIS — K3184 Gastroparesis: Secondary | ICD-10-CM | POA: Diagnosis present

## 2018-06-29 DIAGNOSIS — R1111 Vomiting without nausea: Secondary | ICD-10-CM | POA: Diagnosis not present

## 2018-06-29 DIAGNOSIS — K9189 Other postprocedural complications and disorders of digestive system: Secondary | ICD-10-CM | POA: Diagnosis not present

## 2018-06-29 DIAGNOSIS — Z7901 Long term (current) use of anticoagulants: Secondary | ICD-10-CM

## 2018-06-29 DIAGNOSIS — Z7189 Other specified counseling: Secondary | ICD-10-CM

## 2018-06-29 DIAGNOSIS — N390 Urinary tract infection, site not specified: Secondary | ICD-10-CM | POA: Diagnosis present

## 2018-06-29 DIAGNOSIS — R579 Shock, unspecified: Secondary | ICD-10-CM

## 2018-06-29 DIAGNOSIS — F329 Major depressive disorder, single episode, unspecified: Secondary | ICD-10-CM | POA: Diagnosis present

## 2018-06-29 DIAGNOSIS — G894 Chronic pain syndrome: Secondary | ICD-10-CM | POA: Diagnosis present

## 2018-06-29 DIAGNOSIS — N179 Acute kidney failure, unspecified: Secondary | ICD-10-CM | POA: Diagnosis present

## 2018-06-29 DIAGNOSIS — I959 Hypotension, unspecified: Secondary | ICD-10-CM | POA: Diagnosis present

## 2018-06-29 DIAGNOSIS — I082 Rheumatic disorders of both aortic and tricuspid valves: Secondary | ICD-10-CM | POA: Diagnosis present

## 2018-06-29 DIAGNOSIS — R609 Edema, unspecified: Secondary | ICD-10-CM | POA: Diagnosis not present

## 2018-06-29 DIAGNOSIS — E871 Hypo-osmolality and hyponatremia: Secondary | ICD-10-CM | POA: Diagnosis present

## 2018-06-29 DIAGNOSIS — M810 Age-related osteoporosis without current pathological fracture: Secondary | ICD-10-CM | POA: Diagnosis present

## 2018-06-29 DIAGNOSIS — Z885 Allergy status to narcotic agent status: Secondary | ICD-10-CM

## 2018-06-29 DIAGNOSIS — K9421 Gastrostomy hemorrhage: Secondary | ICD-10-CM | POA: Diagnosis not present

## 2018-06-29 DIAGNOSIS — K219 Gastro-esophageal reflux disease without esophagitis: Secondary | ICD-10-CM | POA: Diagnosis present

## 2018-06-29 DIAGNOSIS — K56609 Unspecified intestinal obstruction, unspecified as to partial versus complete obstruction: Secondary | ICD-10-CM

## 2018-06-29 DIAGNOSIS — I4892 Unspecified atrial flutter: Secondary | ICD-10-CM | POA: Diagnosis present

## 2018-06-29 DIAGNOSIS — F419 Anxiety disorder, unspecified: Secondary | ICD-10-CM | POA: Diagnosis present

## 2018-06-29 DIAGNOSIS — Z09 Encounter for follow-up examination after completed treatment for conditions other than malignant neoplasm: Secondary | ICD-10-CM

## 2018-06-29 DIAGNOSIS — A419 Sepsis, unspecified organism: Secondary | ICD-10-CM | POA: Diagnosis not present

## 2018-06-29 DIAGNOSIS — Z801 Family history of malignant neoplasm of trachea, bronchus and lung: Secondary | ICD-10-CM

## 2018-06-29 DIAGNOSIS — R6521 Severe sepsis with septic shock: Secondary | ICD-10-CM | POA: Diagnosis present

## 2018-06-29 DIAGNOSIS — R55 Syncope and collapse: Secondary | ICD-10-CM | POA: Diagnosis present

## 2018-06-29 DIAGNOSIS — Z978 Presence of other specified devices: Secondary | ICD-10-CM

## 2018-06-29 DIAGNOSIS — Z9089 Acquired absence of other organs: Secondary | ICD-10-CM

## 2018-06-29 DIAGNOSIS — Z981 Arthrodesis status: Secondary | ICD-10-CM

## 2018-06-29 DIAGNOSIS — E440 Moderate protein-calorie malnutrition: Secondary | ICD-10-CM | POA: Diagnosis present

## 2018-06-29 DIAGNOSIS — I480 Paroxysmal atrial fibrillation: Secondary | ICD-10-CM | POA: Diagnosis present

## 2018-06-29 DIAGNOSIS — Z7952 Long term (current) use of systemic steroids: Secondary | ICD-10-CM

## 2018-06-29 DIAGNOSIS — Z91010 Allergy to peanuts: Secondary | ICD-10-CM

## 2018-06-29 DIAGNOSIS — K5900 Constipation, unspecified: Secondary | ICD-10-CM

## 2018-06-29 LAB — COMPREHENSIVE METABOLIC PANEL
ALT: 32 U/L (ref 0–44)
ANION GAP: 9 (ref 5–15)
AST: 65 U/L — ABNORMAL HIGH (ref 15–41)
Albumin: 3.4 g/dL — ABNORMAL LOW (ref 3.5–5.0)
Alkaline Phosphatase: 28 U/L — ABNORMAL LOW (ref 38–126)
BUN: 12 mg/dL (ref 8–23)
CHLORIDE: 95 mmol/L — AB (ref 98–111)
CO2: 24 mmol/L (ref 22–32)
Calcium: 8.4 mg/dL — ABNORMAL LOW (ref 8.9–10.3)
Creatinine, Ser: 0.61 mg/dL (ref 0.44–1.00)
Glucose, Bld: 105 mg/dL — ABNORMAL HIGH (ref 70–99)
POTASSIUM: 5.9 mmol/L — AB (ref 3.5–5.1)
Sodium: 128 mmol/L — ABNORMAL LOW (ref 135–145)
Total Bilirubin: 2.2 mg/dL — ABNORMAL HIGH (ref 0.3–1.2)
Total Protein: 4.9 g/dL — ABNORMAL LOW (ref 6.5–8.1)

## 2018-06-29 LAB — URINALYSIS, ROUTINE W REFLEX MICROSCOPIC
BILIRUBIN URINE: NEGATIVE
Glucose, UA: NEGATIVE mg/dL
KETONES UR: 5 mg/dL — AB
Nitrite: NEGATIVE
Protein, ur: NEGATIVE mg/dL
Specific Gravity, Urine: 1.008 (ref 1.005–1.030)
pH: 7 (ref 5.0–8.0)

## 2018-06-29 LAB — CBC
HEMATOCRIT: 34.9 % — AB (ref 36.0–46.0)
Hemoglobin: 11.8 g/dL — ABNORMAL LOW (ref 12.0–15.0)
MCH: 36.8 pg — ABNORMAL HIGH (ref 26.0–34.0)
MCHC: 33.8 g/dL (ref 30.0–36.0)
MCV: 108.7 fL — AB (ref 78.0–100.0)
Platelets: 150 10*3/uL (ref 150–400)
RBC: 3.21 MIL/uL — AB (ref 3.87–5.11)
RDW: 13.3 % (ref 11.5–15.5)
WBC: 5.5 10*3/uL (ref 4.0–10.5)

## 2018-06-29 LAB — I-STAT TROPONIN, ED: Troponin i, poc: 0.07 ng/mL (ref 0.00–0.08)

## 2018-06-29 LAB — LIPASE, BLOOD: LIPASE: 34 U/L (ref 11–51)

## 2018-06-29 MED ORDER — NITROGLYCERIN 0.4 MG SL SUBL: 0.4000 mg | SUBLINGUAL_TABLET | SUBLINGUAL | Status: DC | PRN

## 2018-06-29 MED ORDER — AMIODARONE HCL 200 MG PO TABS
100.0000 mg | ORAL_TABLET | Freq: Two times a day (BID) | ORAL | Status: DC
  Administered 2018-06-29 – 2018-07-02 (×6): 100 mg via ORAL
  Filled 2018-06-29 (×6): qty 1

## 2018-06-29 MED ORDER — OXYCODONE-ACETAMINOPHEN 7.5-325 MG PO TABS
1.0000 | ORAL_TABLET | Freq: Four times a day (QID) | ORAL | Status: DC | PRN
  Administered 2018-06-29 – 2018-07-01 (×4): 1 via ORAL
  Filled 2018-06-29 (×4): qty 1

## 2018-06-29 MED ORDER — FESOTERODINE FUMARATE ER 8 MG PO TB24
8.0000 mg | ORAL_TABLET | Freq: Every day | ORAL | Status: DC
  Administered 2018-06-30 – 2018-07-02 (×3): 8 mg via ORAL
  Filled 2018-06-29 (×5): qty 1

## 2018-06-29 MED ORDER — SODIUM CHLORIDE 0.9 % IV SOLN: 250.0000 mL | INTRAVENOUS | Status: DC | PRN

## 2018-06-29 MED ORDER — ESCITALOPRAM OXALATE 10 MG PO TABS
5.0000 mg | ORAL_TABLET | Freq: Every day | ORAL | Status: DC
  Administered 2018-06-30 – 2018-07-02 (×3): 5 mg via ORAL
  Filled 2018-06-29 (×3): qty 1

## 2018-06-29 MED ORDER — FOLIC ACID 1 MG PO TABS
1.0000 mg | ORAL_TABLET | Freq: Every day | ORAL | Status: DC
  Administered 2018-06-30 – 2018-07-10 (×9): 1 mg via ORAL
  Filled 2018-06-29 (×9): qty 1

## 2018-06-29 MED ORDER — LORAZEPAM 1 MG PO TABS
2.0000 mg | ORAL_TABLET | Freq: Every day | ORAL | Status: DC
  Administered 2018-06-29 – 2018-07-01 (×3): 2 mg via ORAL
  Filled 2018-06-29 (×3): qty 2

## 2018-06-29 MED ORDER — FERROUS SULFATE 325 (65 FE) MG PO TABS
325.0000 mg | ORAL_TABLET | Freq: Every day | ORAL | Status: DC
  Administered 2018-06-29 – 2018-07-01 (×3): 325 mg via ORAL
  Filled 2018-06-29 (×3): qty 1

## 2018-06-29 MED ORDER — SODIUM CHLORIDE 0.9 % IV SOLN
INTRAVENOUS | Status: DC
  Administered 2018-06-29: 13:00:00 via INTRAVENOUS

## 2018-06-29 MED ORDER — DICLOFENAC SODIUM 1 % TD GEL
2.0000 g | Freq: Four times a day (QID) | TRANSDERMAL | Status: DC
  Administered 2018-06-29 – 2018-07-14 (×53): 2 g via TOPICAL
  Filled 2018-06-29 (×3): qty 100

## 2018-06-29 MED ORDER — SODIUM CHLORIDE 0.9 % IV SOLN
INTRAVENOUS | Status: DC
  Administered 2018-06-29 – 2018-07-02 (×7): via INTRAVENOUS

## 2018-06-29 MED ORDER — SODIUM CHLORIDE 0.9 % IV BOLUS
1000.0000 mL | Freq: Once | INTRAVENOUS | Status: AC
  Administered 2018-06-29: 1000 mL via INTRAVENOUS

## 2018-06-29 MED ORDER — SODIUM CHLORIDE 0.9 % IV SOLN
1.0000 g | INTRAVENOUS | Status: DC
  Administered 2018-06-29: 1 g via INTRAVENOUS
  Filled 2018-06-29: qty 10

## 2018-06-29 MED ORDER — SODIUM FLUORIDE 0.2 % MT SOLN: 1.0000 "application " | Freq: Every day | OROMUCOSAL | Status: DC

## 2018-06-29 MED ORDER — METOCLOPRAMIDE HCL 5 MG/ML IJ SOLN
5.0000 mg | Freq: Four times a day (QID) | INTRAMUSCULAR | Status: AC
  Administered 2018-06-29 – 2018-06-30 (×6): 5 mg via INTRAVENOUS
  Filled 2018-06-29 (×6): qty 2

## 2018-06-29 MED ORDER — POTASSIUM CHLORIDE IN NACL 20-0.9 MEQ/L-% IV SOLN
INTRAVENOUS | Status: DC
  Filled 2018-06-29: qty 1000

## 2018-06-29 MED ORDER — APIXABAN 5 MG PO TABS
5.0000 mg | ORAL_TABLET | Freq: Two times a day (BID) | ORAL | Status: DC
  Administered 2018-06-29 – 2018-07-02 (×6): 5 mg via ORAL
  Filled 2018-06-29 (×6): qty 1

## 2018-06-29 MED ORDER — ACETAMINOPHEN 650 MG RE SUPP: 650.0000 mg | Freq: Four times a day (QID) | RECTAL | Status: DC | PRN

## 2018-06-29 MED ORDER — ROSUVASTATIN CALCIUM 10 MG PO TABS
10.0000 mg | ORAL_TABLET | Freq: Every day | ORAL | Status: DC
  Administered 2018-06-29 – 2018-07-01 (×3): 10 mg via ORAL
  Filled 2018-06-29 (×3): qty 1

## 2018-06-29 MED ORDER — DOCUSATE CALCIUM 240 MG PO CAPS: 240.0000 mg | ORAL_CAPSULE | Freq: Every day | ORAL | Status: DC

## 2018-06-29 MED ORDER — ACETAMINOPHEN 325 MG PO TABS: 650.0000 mg | ORAL_TABLET | Freq: Four times a day (QID) | ORAL | Status: DC | PRN

## 2018-06-29 MED ORDER — DOCUSATE SODIUM 100 MG PO CAPS: 100.0000 mg | ORAL_CAPSULE | Freq: Every day | ORAL | Status: DC | PRN

## 2018-06-29 MED ORDER — SODIUM CHLORIDE 0.9% FLUSH
3.0000 mL | Freq: Two times a day (BID) | INTRAVENOUS | Status: DC
  Administered 2018-06-29 – 2018-07-15 (×22): 3 mL via INTRAVENOUS

## 2018-06-29 MED ORDER — VITAMIN B-12 1000 MCG PO TABS
1000.0000 ug | ORAL_TABLET | Freq: Every day | ORAL | Status: DC
  Administered 2018-06-30 – 2018-07-02 (×3): 1000 ug via ORAL
  Filled 2018-06-29 (×3): qty 1

## 2018-06-29 MED ORDER — PANTOPRAZOLE SODIUM 40 MG PO TBEC
40.0000 mg | DELAYED_RELEASE_TABLET | Freq: Two times a day (BID) | ORAL | Status: DC
  Administered 2018-06-29 – 2018-07-02 (×6): 40 mg via ORAL
  Filled 2018-06-29 (×6): qty 1

## 2018-06-29 MED ORDER — FUROSEMIDE 20 MG PO TABS
40.0000 mg | ORAL_TABLET | Freq: Every day | ORAL | Status: DC
  Administered 2018-06-30 – 2018-07-02 (×3): 40 mg via ORAL
  Filled 2018-06-29 (×3): qty 2

## 2018-06-29 MED ORDER — ONDANSETRON HCL 4 MG PO TABS
4.0000 mg | ORAL_TABLET | Freq: Four times a day (QID) | ORAL | Status: DC | PRN
  Administered 2018-06-29: 4 mg via ORAL
  Filled 2018-06-29: qty 1

## 2018-06-29 MED ORDER — POLYETHYLENE GLYCOL 3350 17 G PO PACK
17.0000 g | PACK | Freq: Two times a day (BID) | ORAL | Status: DC
  Administered 2018-06-29 – 2018-07-02 (×5): 17 g via ORAL
  Filled 2018-06-29 (×6): qty 1

## 2018-06-29 MED ORDER — ONDANSETRON HCL 4 MG/2ML IJ SOLN
4.0000 mg | Freq: Four times a day (QID) | INTRAMUSCULAR | Status: DC | PRN
  Administered 2018-06-30 – 2018-07-11 (×9): 4 mg via INTRAVENOUS
  Filled 2018-06-29 (×11): qty 2

## 2018-06-29 MED ORDER — METOCLOPRAMIDE HCL 5 MG/ML IJ SOLN: 10.0000 mg | Freq: Four times a day (QID) | INTRAMUSCULAR | Status: DC

## 2018-06-29 MED ORDER — CALCIUM CARBONATE-VITAMIN D 500-200 MG-UNIT PO TABS
1.0000 | ORAL_TABLET | Freq: Two times a day (BID) | ORAL | Status: DC
  Administered 2018-06-29 – 2018-07-02 (×6): 1 via ORAL
  Filled 2018-06-29 (×7): qty 1

## 2018-06-29 MED ORDER — SODIUM CHLORIDE 0.9% FLUSH: 3.0000 mL | INTRAVENOUS | Status: DC | PRN

## 2018-06-29 MED ORDER — ACEBUTOLOL HCL 200 MG PO CAPS
200.0000 mg | ORAL_CAPSULE | Freq: Every day | ORAL | Status: DC
Start: 2018-06-30 — End: 2018-07-04
  Administered 2018-06-30 – 2018-07-02 (×3): 200 mg via ORAL
  Filled 2018-06-29 (×5): qty 1

## 2018-06-29 MED ORDER — POTASSIUM BICARB-CITRIC ACID 10 MEQ PO TBEF: 10.0000 meq | EFFERVESCENT_TABLET | Freq: Every day | ORAL | Status: DC

## 2018-06-29 MED ORDER — VITAMIN C 500 MG PO TABS
500.0000 mg | ORAL_TABLET | Freq: Every day | ORAL | Status: DC
  Administered 2018-06-30 – 2018-07-02 (×3): 500 mg via ORAL
  Filled 2018-06-29 (×3): qty 1

## 2018-06-29 MED ORDER — PREDNISONE 5 MG PO TABS
5.0000 mg | ORAL_TABLET | Freq: Every day | ORAL | Status: DC
  Administered 2018-06-30 – 2018-07-02 (×3): 5 mg via ORAL
  Filled 2018-06-29 (×3): qty 1

## 2018-06-29 MED ORDER — PROPRANOLOL HCL 10 MG PO TABS
10.0000 mg | ORAL_TABLET | ORAL | Status: DC | PRN
  Administered 2018-06-30: 10 mg via ORAL
  Filled 2018-06-29 (×2): qty 1

## 2018-06-29 NOTE — Progress Notes (Signed)
Patient c/o upper lip starting to swell a little.  She states this happens sometimes and she usually takes Benadryl when it does.  RN paged C. Bodenheimer, NP to make him aware and ask him to advise, awaiting response to page.  RN will continue to monitor patient and report any changes to provider immediately.  P.J. Linus Mako, RN

## 2018-06-29 NOTE — ED Triage Notes (Signed)
Pt comes from home with ems for nausea for 1 wk but worse today, intermittent left arm pain that has now subsided. Pt has hx of a gastric emptying problem and has had decreased appetite for a while. Given 4mg  zofran en route with little relief of nausea. BP 120/63 HR60. Pt a./o

## 2018-06-29 NOTE — ED Notes (Signed)
Patient transported to X-ray 

## 2018-06-29 NOTE — H&P (Signed)
History and Physical    Angela [name]:[id number] DOB: 1935/12/17 DOA: 06/22/2018  PCP: [name], MD  Patient coming from: Home  I have personally briefly reviewed patient's old medical records in [place]  Chief Complaint: Right arm tingling and numbness and nausea  HPI: Angela [name] is a 82 y.o. female with medical history significant of severe pyloric stenosis and pyloric channel difficulties resulting in retained food in the stomach with multiple visions of the pylorus and recent recommendation for placement of J-tube for feeding due to malnutrition and inability to tell food from stomach to intestines, very artery disease status post PCI, hypertension, cervical spine degenerative disease and arthritis with multiple surgeries hyperlipidemia, rheumatoid arthritis, peptic ulcer disease, aortic stenosis and malnutrition due to poor intake from retained food in stomach who presents the emergency department with complaints of nausea and arm pain.  He states that she has had tingling in her arm on and off for 5 years and she has tingling at the present time.  She reports multiple surgeries on her neck and spine due to arthritis.  Denies any chest pain or shortness of breath.  With regards to her nausea she has had very poor stool output recently.  She takes multiple medications that are constipating.  She states that she takes MiraLAX perhaps every other day.  She is not really quite sure.  It is not on her medication list.  Docusate however is on her medication list but she says she is not taking that.  I think that she is taking so many medicines it is difficult for her to keep up with what she is taking on a as needed basis.   ED Course: Tender abdomen with acute abdominal series revealing a very large stool burden but no acute cardiopulmonary abnormality.  He was low at 128 likely due to dehydration from poor p.o. intake.  Potassium elevated 5.9 likely due to hemolyzed  specimen. She referred to [facility] for further evaluation and management. Review of Systems: As per HPI otherwise all other systems reviewed and  negative.   Past Medical History:  Diagnosis Date  . A-fib ([place])   . Acute renal failure ([place]) 06/15/2013  . Aortic stenosis    mild by echo 03/2012  . Asthma   . Atrial fibrillation ([place])   . Atrial flutter ([place]) 01/16/2016  . CAD (coronary artery disease)    s/p PCI with BMS to mid LAD--2/08 not on aspirin due to frequent ulcers.  Repeat cath for CP 09/2012 with patent LAD stent and otherwise normal coronary arteries  . Cardiomyopathy ([place]) 01/30/2016  . Cervical stenosis of spine   . Chronic pain syndrome 12/02/2017  . Depression   . Duodenitis 01/16/2016  . Dyslipidemia   . Elbow fracture, right    history of- never any surgery  . Essential hypertension, benign 11/03/2013  . Gastric outlet obstruction 01/06/2016  . Gastroparesis 01/06/2016  . GERD (gastroesophageal reflux disease)   . Headache   . Hypertension   . Hyponatremia 06/15/2013  . Impaired physical mobility    due to rheumatoid arthritis "ambulates with walker" limited free standing.  . Intestinal polyp   . Macular degeneration   . Obesity   . Osteoporosis    alendronate since 2012  . PUD (peptic ulcer disease)    can not take aspirin  . PVC's (premature ventricular contractions)   . RA (rheumatoid arthritis) ([place])   . Rheumatoid arthritis(714.0)    Dr. Lenna Gilford follows  .  Spondylolisthesis of lumbar region   . SVT (supraventricular tachycardia) ([place]) 01/16/2016    Past Surgical History:  Procedure Laterality Date  . ANKLE FUSION Bilateral    x2 both  . BALLOON DILATION N/A 01/10/2018   Procedure: BALLOON DILATION;  Surgeon: [name], MD;  Location: [place];  Service: Gastroenterology;  Laterality: N/A;  . BALLOON DILATION N/A 01/24/2018   Procedure: Larrie Kass DILATION;  Surgeon: [name], MD;  Location: [place];  Service: Gastroenterology;  Laterality: N/A;  W/  Fluoroscopy. Please use colonoscope.  Marland Kitchen BALLOON DILATION N/A 04/15/2018   Procedure: BALLOON DILATION;  Surgeon: [name], MD;  Location: [facility];  Service: Gastroenterology;  Laterality: N/A;  . BALLOON DILATION N/A 06/17/2018   Procedure: BALLOON DILATION;  Surgeon: [name], MD;  Location: [facility];  Service: Gastroenterology;  Laterality: N/A;  . BIOPSY  06/17/2018   Procedure: BIOPSY;  Surgeon: [name], MD;  Location: [facility];  Service: Gastroenterology;;  . CARDIAC CATHETERIZATION  09/2012   patent LAD stent and otherwise normal coronary arteries  . CARDIAC CATHETERIZATION N/A 03/07/2016   Procedure: Left Heart Cath and Coronary Angiography;  Surgeon: Peter M Martinique, MD;  Location: [place] CV LAB;  Service: Cardiovascular;  Laterality: N/A;  . CARDIOVERSION N/A 03/13/2018   Procedure: CARDIOVERSION;  Surgeon: Lelon Perla, MD;  Location: [facility] ENDOSCOPY;  Service: Cardiovascular;  Laterality: N/A;  . CARDIOVERSION N/A 05/09/2018   Procedure: CARDIOVERSION;  Surgeon: Pixie Casino, MD;  Location[facility] ENDOSCOPY;  Service: Cardiovascular;  Laterality: N/A;  . CATARACT EXTRACTION, BILATERAL Bilateral   . CERVICAL FUSION     limited  ROM  . CHOLECYSTECTOMY    . ESOPHAGOGASTRODUODENOSCOPY N/A 01/07/2016   Procedure: ESOPHAGOGASTRODUODENOSCOPY (EGD);  Surgeon: Wilford Corner, MD;  Location: [facility];  Service: Endoscopy;  Laterality: N/A;  . ESOPHAGOGASTRODUODENOSCOPY N/A 01/10/2018   Procedure: ESOPHAGOGASTRODUODENOSCOPY (EGD);  Surgeon: [name], MD;  Location: [place];  Service: Gastroenterology;  Laterality: N/A;  . ESOPHAGOGASTRODUODENOSCOPY N/A 01/24/2018   Procedure: ESOPHAGOGASTRODUODENOSCOPY (EGD);  Surgeon: [name], MD;  Location: [place];  Service: Gastroenterology;  Laterality: N/A;  . ESOPHAGOGASTRODUODENOSCOPY N/A 04/15/2018   Procedure: ESOPHAGOGASTRODUODENOSCOPY (EGD);  Surgeon: [name], MD;  Location: [facility];  Service:  Gastroenterology;  Laterality: N/A;  . ESOPHAGOGASTRODUODENOSCOPY N/A 06/17/2018   Procedure: ESOPHAGOGASTRODUODENOSCOPY (EGD);  Surgeon: [name], MD;  Location: [facility];  Service: Gastroenterology;  Laterality: N/A;  . ESOPHAGOGASTRODUODENOSCOPY (EGD) WITH PROPOFOL N/A 11/01/2015   Procedure: ESOPHAGOGASTRODUODENOSCOPY (EGD) WITH PROPOFOL w/ Dialtation;  Surgeon: Garlan Fair, MD;  Location: [facility];  Service: Endoscopy;  Laterality: N/A;  . FRACTURE SURGERY  09/2011   left ankle screw and pin removed  . HAND SURGERY Bilateral    x4 digits 2-5 both hands  . JOINT REPLACEMENT Bilateral    BTKA  . TONSILLECTOMY      Social History   Social History Narrative  . Not on file     reports that she has never smoked. She has never used smokeless tobacco. She reports that she does not drink alcohol or use drugs.  Allergies  Allergen Reactions  . Peanut-Containing Drug Products Anaphylaxis  . Reglan [Metoclopramide] Other (See Comments)    "Messed my mind up"  . Clindamycin/Lincomycin Diarrhea  . Codeine Nausea Only  . Iohexol Swelling and Other (See Comments)     Desc: pt received hypaque 60% in 1994 and had erythema, hives & lips swell.  She did ok w/o premeds in 8/05 at [facility].  Pt.  takes daily prednisone for rheumatoid arthritis.  Poor venous access today (7/7/6) so we did her w/o iv contrast.   . Pineapple Swelling  . Shellfish Allergy Swelling and Other (See Comments)    Lips swell    Family History  Problem Relation Age of Onset  . Lung cancer Mother   . Arrhythmia Mother   . Melanoma Brother   . Heart attack Neg Hx     Prior to Admission medications   Medication Sig Start Date End Date Taking? Authorizing Provider  acebutolol (SECTRAL) 200 MG capsule Take 1 capsule (200 mg total) by mouth daily. 03/10/18  Yes Bhagat, Bhavinkumar, PA  amiodarone (PACERONE) 100 MG tablet Take 1 tablet (100 mg total) by mouth 2 (two) times daily. 06/05/18  Yes Nahser, Wonda Cheng,  MD  apixaban (ELIQUIS) 5 MG TABS tablet Take 1 tablet (5 mg total) by mouth 2 (two) times daily. 07/31/17  Yes Daune Perch, NP  butalbital-acetaminophen-caffeine (FIORICET, ESGIC) 50-325-40 MG per tablet Take 1 tablet by mouth every 6 (six) hours as needed for headache or migraine.  06/23/12  Yes [provider]  Calcium Carbonate-Vitamin D (CALCIUM 500+D PO) Take 1 tablet by mouth 2 (two) times daily.    Yes [provider]  diclofenac sodium (VOLTAREN) 1 % GEL Apply 2 grams topically at bedtime to shoulders and elbows 01/29/18  Yes Meredith Staggers, MD  Docusate Calcium (STOOL SOFTENER PO) Take 1 tablet by mouth daily as needed (constipation).    Yes [provider]  escitalopram (LEXAPRO) 5 MG tablet TAKE 1 TABLET BY MOUTH IN THE MORNING Patient taking differently: Take 5 mg by mouth daily. TAKE 1 TABLET BY MOUTH IN THE MORNING 06/16/18  Yes Meredith Staggers, MD  ferrous sulfate 325 (65 FE) MG tablet Take 325 mg by mouth at bedtime.    Yes [provider]  folic acid (FOLVITE) 1 MG tablet Take 1 mg by mouth daily.  12/03/13  Yes [provider]  furosemide (LASIX) 40 MG tablet Take 1 tablet (40 mg total) by mouth daily. 05/23/18 08/21/18 Yes Nahser, Wonda Cheng, MD  lidocaine (LIDODERM) 5 % PLACE 3 PATCHES ONTO THE SKIN EVERY 12 (TWELVE) HOURS. APPLY NECK AND ELBOW Patient taking differently: Place 2 patches onto the skin daily.  11/01/17  Yes Meredith Staggers, MD  LORazepam (ATIVAN) 2 MG tablet Take 2 mg by mouth at bedtime. 02/05/15  Yes [provider]  methotrexate (RHEUMATREX) 2.5 MG tablet Take 15 mg by mouth every Saturday.  03/01/12  Yes [provider]  nitroGLYCERIN (NITROSTAT) 0.4 MG SL tablet Place 1 tablet (0.4 mg total) under the tongue every 5 (five) minutes as needed for chest pain. 02/21/16  Yes Turner, Eber Hong, MD  nystatin cream (MYCOSTATIN) Apply 1 application topically daily as needed for dry skin.   Yes [provider]  oxyCODONE-acetaminophen (PERCOCET) 7.5-325 MG tablet Take 1 tablet by mouth every 6 (six) hours as needed for moderate pain. 04/30/18  Yes Meredith Staggers, MD  pantoprazole (PROTONIX) 40 MG tablet Take 40 mg by mouth 2 (two) times daily.    Yes [provider]  Potassium Bicarb-Citric Acid (EFFER-K) 10 MEQ TBEF Take 1 tablet (10 mEq total) by mouth daily. 05/23/18  Yes Nahser, Wonda Cheng, MD  predniSONE (DELTASONE) 5 MG tablet Take 5 mg by mouth daily with breakfast.    Yes [provider]  PREVIDENT 0.2 % SOLN Take 1 application by mouth at bedtime.  09/05/12  Yes [provider]  propranolol (INDERAL) 10 MG tablet Take 1 tablet (10 mg total) by mouth as needed (for palpitations). 03/10/18  Yes Bhagat, Bhavinkumar, PA  rosuvastatin (CRESTOR) 10 MG tablet Take 1 tablet (10 mg total) by mouth daily. Patient taking differently: Take 10 mg by mouth at bedtime.  03/11/18 06/24/2018 Yes Bhagat, Bhavinkumar, PA  TOVIAZ 8 MG TB24 tablet Take 8 mg by mouth daily. 10/25/15  Yes [provider]  vitamin B-12 (CYANOCOBALAMIN) 1000 MCG tablet Take 1,000 mcg by mouth daily.   Yes [provider]  vitamin C (ASCORBIC ACID) 500 MG tablet Take 500 mg by mouth daily.    Yes [provider]    Physical Exam:  Constitutional: NAD, calm, comfortable, frail thin and ill-appearing Vitals:   06/10/2018 1400 07/03/2018 1430 06/30/2018 1500 06/22/2018 1618  BP: 106/61 (!) 114/59 103/78 114/63  Pulse: 62 62 61 61  Resp: 19 (!) 23 18   Temp:    98.6 F (37 C)  TempSrc:    Oral  SpO2: 98% 97% 98% 96%  Weight:      Height:       Eyes: PERRL, lids and conjunctivae normal ENMT: Mucous membranes are dry. Posterior pharynx clear of any exudate or lesions.Normal dentition.  Neck: normal, supple, no masses, no thyromegaly Respiratory: clear to auscultation bilaterally, no wheezing, no crackles. Normal respiratory effort. No accessory muscle use.  Cardiovascular:  Regular rate and rhythm, no murmurs / rubs / gallops. 2+ extremity edema R>L.  2+ pedal pulses. No carotid bruits.  Abdomen: Diffuse tenderness, colonic masses palpated. No hepatosplenomegaly. Bowel sounds positive.  No rebound or guarding Musculoskeletal: no clubbing / cyanosis.  Multiple joint deformity upper (swan-necking) and lower extremities. Good ROM, no contractures. Normal muscle tone.  Skin: no rashes, lesions, ulcers. No induration Neurologic: CN 2-12 grossly intact. Sensation intact, DTR normal. Strength 5/5 in all 4.  Psychiatric: Normal judgment and insight. Alert and oriented x 3. Normal mood.    Labs on Admission: I have personally reviewed following labs and imaging studies  CBC: Recent Labs  Lab 06/19/2018 1148  WBC 5.5  HGB 11.8*  HCT 34.9*  MCV 108.7*  PLT 740   Basic Metabolic Panel: Recent Labs  Lab 06/24/2018 1148  NA 128*  K 5.9*  CL 95*  CO2 24  GLUCOSE 105*  BUN 12  CREATININE 0.61  CALCIUM 8.4*   GFR: Estimated Creatinine Clearance: 43 mL/min (by C-G formula based on SCr of 0.61 mg/dL). Liver Function Tests: Recent Labs  Lab 06/19/2018 1148  AST 65*  ALT 32  ALKPHOS 28*  BILITOT 2.2*  PROT 4.9*  ALBUMIN 3.4*   Recent Labs  Lab 06/18/2018 1148  LIPASE 34   Urine analysis:    Component Value Date/Time   COLORURINE YELLOW 06/06/2018 1347   APPEARANCEUR HAZY (A) 06/28/2018 1347   LABSPEC 1.008 06/28/2018 1347   PHURINE 7.0 06/07/2018 1347   GLUCOSEU NEGATIVE 06/28/2018 1347   HGBUR SMALL (A) 06/27/2018 1347   BILIRUBINUR NEGATIVE 06/19/2018 1347   KETONESUR 5 (A) 06/19/2018 1347   PROTEINUR NEGATIVE 06/10/2018 1347   UROBILINOGEN 0.2 06/15/2013 2213   NITRITE NEGATIVE 06/05/2018 1347   LEUKOCYTESUR MODERATE (A) 06/28/2018 1347    Radiological Exams on Admission: Dg Abd Acute W/chest  Result Date: 07/01/2018 CLINICAL DATA:  Nausea. EXAM: DG ABDOMEN ACUTE W/ 1V CHEST COMPARISON:  Radiographs of May 14, 2018. FINDINGS: Large amount  of stool seen throughout the colon. No abnormal bowel dilatation  is noted. No radiopaque calculi or other significant radiographic abnormality is seen. Stable cardiomegaly. Status post cholecystectomy. Atherosclerosis of thoracic aorta is noted. Both lungs are clear. IMPRESSION: Large stool burden is noted. No abnormal bowel dilatation is noted. No acute cardiopulmonary disease. Aortic Atherosclerosis (ICD10-I70.0). Electronically Signed   By: Marijo Conception, M.D.   On: 06/14/2018 13:23    EKG: Independently reviewed. Sinus bradycardia; Otherwise normal ECG; No significant change since last tracing   Assessment/Plan Principal Problem:   Constipation Active Problems:   Rheumatoid arthritis ([place])    1.  Patient with severe constipation: She will be placed in observation and given an enema as well as Reglan to help move her bowels.  When she has a good bowel movement we will stop Reglan.  She has had a reaction in the past where Reglan made her confused.  I do not expect that this will happen this time as we are using it for a short period of time.  2.  Rheumatoid arthritis: Patient with severe rheumatoid arthritis now with tingling in her hands.  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  I recommended physical therapy for her as an outpatient which we can arrange at discharge.  DVT prophylaxis: Xarelto Code Status: Full code Family Communication: Spoke with patient and her brother who was present on admission.  Patient retains full capacity.  Disposition Plan: Likely home in a.m. Consults called: None Admission status: Observation   [name] MD FACP [facility] Pager [phone]  If 7PM-7AM, please contact night-coverage [email or web address] Password [facility]  06/24/2018, 5:38 PM

## 2018-06-29 NOTE — ED Notes (Signed)
Pt unable to urinate @ this time.

## 2018-06-29 NOTE — ED Provider Notes (Signed)
[facility] EMERGENCY DEPARTMENT Provider Note   CSN: [id number] Arrival date & time: 06/28/2018  1138     History   Chief Complaint Chief Complaint  Patient presents with  . Failure To Thrive  . Nausea    HPI Angela [name] is a 82 y.o. female.  82 year old female presents with increasing weakness as well as worsening chronic nausea.  Patient has a history of pyloric stenosis and has been considered for a feeding tube.  States that she has had decreased oral intake but denies any emesis at this time.  Move her bowels today which she said was hard stool.  Slight abdominal distention appreciated according to her.  No urinary symptoms.  No fever or chills.  Called EMS and was given Zofran with slight relief of her symptoms.  Has scheduled GI follow-up in the near future.     Past Medical History:  Diagnosis Date  . A-fib ([place])   . Acute renal failure ([place]) 06/15/2013  . Aortic stenosis    mild by echo 03/2012  . Asthma   . Atrial fibrillation ([place])   . Atrial flutter ([place]) 01/16/2016  . CAD (coronary artery disease)    s/p PCI with BMS to mid LAD--2/08 not on aspirin due to frequent ulcers.  Repeat cath for CP 09/2012 with patent LAD stent and otherwise normal coronary arteries  . Cardiomyopathy ([place]) 01/30/2016  . Cervical stenosis of spine   . Chronic pain syndrome 12/02/2017  . Depression   . Duodenitis 01/16/2016  . Dyslipidemia   . Elbow fracture, right    history of- never any surgery  . Essential hypertension, benign 11/03/2013  . Gastric outlet obstruction 01/06/2016  . Gastroparesis 01/06/2016  . GERD (gastroesophageal reflux disease)   . Headache   . Hypertension   . Hyponatremia 06/15/2013  . Impaired physical mobility    due to rheumatoid arthritis "ambulates with walker" limited free standing.  . Intestinal polyp   . Macular degeneration   . Obesity   . Osteoporosis    alendronate since 2012  . PUD (peptic ulcer disease)    can not take  aspirin  . PVC's (premature ventricular contractions)   . RA (rheumatoid arthritis) ([place])   . Rheumatoid arthritis(714.0)    Dr. Lenna Gilford follows  . Spondylolisthesis of lumbar region   . SVT (supraventricular tachycardia) ([place]) 01/16/2016    Patient Active Problem List   Diagnosis Date Noted  . Chronic pain syndrome 12/02/2017  . Reactive depression 07/11/2016  . Abnormal nuclear stress test 03/07/2016  . Cardiomyopathy ([place]) 01/30/2016  . Atrial flutter ([place]) 01/16/2016  . SVT (supraventricular tachycardia) ([place]) 01/16/2016  . Duodenitis 01/16/2016  . Atrial fibrillation ([place]) 01/13/2016  . Protein-calorie malnutrition, severe 01/11/2016  . Gastric outlet obstruction 01/06/2016  . Gastroparesis 01/06/2016  . PAF (paroxysmal atrial fibrillation) ([place]) 01/06/2016  . Constipation 07/20/2015  . Edema extremities 02/18/2014  . Aortic stenosis   . CAD (coronary artery disease)   . PVC's (premature ventricular contractions) 11/03/2013  . Dyslipidemia 11/03/2013  . Essential hypertension, benign 11/03/2013  . Acute renal failure ([place]) 06/15/2013  . Hyponatremia 06/15/2013  . Diarrhea in adult patient 06/15/2013  . Acidosis 06/15/2013  . Cervical spondylosis with myelopathy 01/29/2012  . Rheumatoid arthritis ([place]) 01/29/2012  . Spondylolisthesis of lumbar region 01/29/2012    Past Surgical History:  Procedure Laterality Date  . ANKLE FUSION Bilateral    x2 both  . BALLOON DILATION N/A 01/10/2018   Procedure: BALLOON DILATION;  Surgeon: [name], MD;  Location: [facility];  Service: Gastroenterology;  Laterality: N/A;  . BALLOON DILATION N/A 01/24/2018   Procedure: Larrie Kass DILATION;  Surgeon: [name], MD;  Location: [facility];  Service: Gastroenterology;  Laterality: N/A;  W/ Fluoroscopy. Please use colonoscope.  Marland Kitchen BALLOON DILATION N/A 04/15/2018   Procedure: BALLOON DILATION;  Surgeon: [name], MD;  [facility];  Service: Gastroenterology;  Laterality: N/A;   . BALLOON DILATION N/A 06/17/2018   Procedure: BALLOON DILATION;  Surgeon: [name], MD;  Location: [facility];  Service: Gastroenterology;  Laterality: N/A;  . BIOPSY  06/17/2018   Procedure: BIOPSY;  Surgeon: [name], MD;  Location: [facility];  Service: Gastroenterology;;  . CARDIAC CATHETERIZATION  09/2012   patent LAD stent and otherwise normal coronary arteries  . CARDIAC CATHETERIZATION N/A 03/07/2016   Procedure: Left Heart Cath and Coronary Angiography;  Surgeon: Peter M Martinique, MD;  Location: [facility];  Service: Cardiovascular;  Laterality: N/A;  . CARDIOVERSION N/A 03/13/2018   Procedure: CARDIOVERSION;  Surgeon: Lelon Perla, MD;  Location: [facility] ENDOSCOPY;  Service: Cardiovascular;  Laterality: N/A;  . CARDIOVERSION N/A 05/09/2018   Procedure: CARDIOVERSION;  Surgeon: Pixie Casino, MD;  Location: [facility] ENDOSCOPY;  Service: Cardiovascular;  Laterality: N/A;  . CATARACT EXTRACTION, BILATERAL Bilateral   . CERVICAL FUSION     limited  ROM  . CHOLECYSTECTOMY    . ESOPHAGOGASTRODUODENOSCOPY N/A 01/07/2016   Procedure: ESOPHAGOGASTRODUODENOSCOPY (EGD);  Surgeon: Wilford Corner, MD;  Location: [facility];  Service: Endoscopy;  Laterality: N/A;  . ESOPHAGOGASTRODUODENOSCOPY N/A 01/10/2018   Procedure: ESOPHAGOGASTRODUODENOSCOPY (EGD);  Surgeon: [name], MD;  Location: [facility];  Service: Gastroenterology;  Laterality: N/A;  . ESOPHAGOGASTRODUODENOSCOPY N/A 01/24/2018   Procedure: ESOPHAGOGASTRODUODENOSCOPY (EGD);  Surgeon: [name], MD;  Location: [facility];  Service: Gastroenterology;  Laterality: N/A;  . ESOPHAGOGASTRODUODENOSCOPY N/A 04/15/2018   Procedure: ESOPHAGOGASTRODUODENOSCOPY (EGD);  Surgeon: [name], MD;  Location[facility];  Service: Gastroenterology;  Laterality: N/A;  . ESOPHAGOGASTRODUODENOSCOPY N/A 06/17/2018   Procedure: ESOPHAGOGASTRODUODENOSCOPY (EGD);  Surgeon: [name], MD;  Location[facility];  Service: Gastroenterology;   Laterality: N/A;  . ESOPHAGOGASTRODUODENOSCOPY (EGD) WITH PROPOFOL N/A 11/01/2015   Procedure: ESOPHAGOGASTRODUODENOSCOPY (EGD) WITH PROPOFOL w/ Dialtation;  Surgeon: Garlan Fair, MD;  Location[facility];  Service: Endoscopy;  Laterality: N/A;  . FRACTURE SURGERY  09/2011   left ankle screw and pin removed  . HAND SURGERY Bilateral    x4 digits 2-5 both hands  . JOINT REPLACEMENT Bilateral    BTKA  . TONSILLECTOMY       OB History   None      Home Medications    Prior to Admission medications   Medication Sig Start Date End Date Taking? Authorizing Provider  amiodarone (PACERONE) 100 MG tablet Take 1 tablet (100 mg total) by mouth 2 (two) times daily. 06/05/18  Yes Nahser, Wonda Cheng, MD  apixaban (ELIQUIS) 5 MG TABS tablet Take 1 tablet (5 mg total) by mouth 2 (two) times daily. 07/31/17  Yes Daune Perch, NP  butalbital-acetaminophen-caffeine (FIORICET, ESGIC) 50-325-40 MG per tablet Take 1 tablet by mouth every 6 (six) hours as needed for headache or migraine.  06/23/12  Yes [provider]  Calcium Carbonate-Vitamin D (CALCIUM 500+D PO) Take 1 tablet by mouth 2 (two) times daily.    Yes [provider]  diclofenac sodium (VOLTAREN) 1 % GEL Apply 2 grams topically at bedtime to shoulders and elbows 01/29/18  Yes Naaman Plummer,  Celesta Gentile, MD  Docusate Calcium (STOOL SOFTENER PO) Take 1 tablet by mouth daily as needed (constipation).    Yes [provider]  escitalopram (LEXAPRO) 5 MG tablet TAKE 1 TABLET BY MOUTH IN THE MORNING Patient taking differently: Take 5 mg by mouth daily. TAKE 1 TABLET BY MOUTH IN THE MORNING 06/16/18  Yes Meredith Staggers, MD  ferrous sulfate 325 (65 FE) MG tablet Take 325 mg by mouth at bedtime.    Yes [provider]  folic acid (FOLVITE) 1 MG tablet Take 1 mg by mouth daily.  12/03/13  Yes [provider]  furosemide (LASIX) 40 MG tablet Take 1 tablet (40 mg total) by mouth daily. 05/23/18 08/21/18 Yes Nahser,  Wonda Cheng, MD  lidocaine (LIDODERM) 5 % PLACE 3 PATCHES ONTO THE SKIN EVERY 12 (TWELVE) HOURS. APPLY NECK AND ELBOW Patient taking differently: Place 2 patches onto the skin daily.  11/01/17  Yes Meredith Staggers, MD  LORazepam (ATIVAN) 2 MG tablet Take 2 mg by mouth at bedtime. 02/05/15  Yes [provider]  methotrexate (RHEUMATREX) 2.5 MG tablet Take 15 mg by mouth every Saturday.  03/01/12  Yes [provider]  nitroGLYCERIN (NITROSTAT) 0.4 MG SL tablet Place 1 tablet (0.4 mg total) under the tongue every 5 (five) minutes as needed for chest pain. 02/21/16  Yes Turner, Eber Hong, MD  nystatin cream (MYCOSTATIN) Apply 1 application topically daily as needed for dry skin.   Yes [provider]  oxyCODONE-acetaminophen (PERCOCET) 7.5-325 MG tablet Take 1 tablet by mouth every 6 (six) hours as needed for moderate pain. 04/30/18  Yes Meredith Staggers, MD  pantoprazole (PROTONIX) 40 MG tablet Take 40 mg by mouth 2 (two) times daily.    Yes [provider]  Potassium Bicarb-Citric Acid (EFFER-K) 10 MEQ TBEF Take 1 tablet (10 mEq total) by mouth daily. 05/23/18  Yes Nahser, Wonda Cheng, MD  predniSONE (DELTASONE) 5 MG tablet Take 5 mg by mouth daily with breakfast.    Yes [provider]  PREVIDENT 0.2 % SOLN Take 1 application by mouth at bedtime.  09/05/12  Yes [provider]  propranolol (INDERAL) 10 MG tablet Take 1 tablet (10 mg total) by mouth as needed (for palpitations). 03/10/18  Yes Bhagat, Bhavinkumar, PA  acebutolol (SECTRAL) 200 MG capsule Take 1 capsule (200 mg total) by mouth daily. 03/10/18   Bhagat, Crista Luria, PA  rosuvastatin (CRESTOR) 10 MG tablet Take 1 tablet (10 mg total) by mouth daily. 03/11/18 06/09/18  Bhagat, Crista Luria, PA  TOVIAZ 8 MG TB24 tablet Take 8 mg by mouth daily. 10/25/15   [provider]  vitamin B-12 (CYANOCOBALAMIN) 1000 MCG tablet Take 1,000 mcg by mouth daily.    [provider]  vitamin C (ASCORBIC  ACID) 500 MG tablet Take 500 mg by mouth daily.     [provider]    Family History Family History  Problem Relation Age of Onset  . Lung cancer Mother   . Arrhythmia Mother   . Melanoma Brother   . Heart attack Neg Hx     Social History Social History   Tobacco Use  . Smoking status: Never Smoker  . Smokeless tobacco: Never Used  Substance Use Topics  . Alcohol use: No  . Drug use: No     Allergies   Peanut-containing drug products; Reglan [metoclopramide]; Clindamycin/lincomycin; Codeine; Iohexol; Pineapple; and Shellfish allergy   Review of Systems Review of Systems  All other systems reviewed and  are negative.    Physical Exam Updated Vital Signs BP 109/64   Pulse (!) 58   Temp 98.7 F (37.1 C) (Oral)   Resp 16   Ht 1.549 m (5\' 1" )   Wt 48.5 kg   SpO2 100%   BMI 20.20 kg/m   Physical Exam  Constitutional: She is oriented to person, place, and time. She appears cachectic.  Non-toxic appearance. No distress.  HENT:  Head: Normocephalic and atraumatic.  Eyes: Pupils are equal, round, and reactive to light. Conjunctivae, EOM and lids are normal.  Neck: Normal range of motion. Neck supple. No tracheal deviation present. No thyroid mass present.  Cardiovascular: Normal rate, regular rhythm and normal heart sounds. Exam reveals no gallop.  No murmur heard. Pulmonary/Chest: Effort normal and breath sounds normal. No stridor. No respiratory distress. She has no decreased breath sounds. She has no wheezes. She has no rhonchi. She has no rales.  Abdominal: Soft. Normal appearance and bowel sounds are normal. She exhibits distension. There is no tenderness. There is no rigidity, no rebound, no guarding and no CVA tenderness.  Musculoskeletal: Normal range of motion. She exhibits no edema or tenderness.  Neurological: She is alert and oriented to person, place, and time. She has normal strength. No cranial nerve deficit or sensory deficit. GCS eye subscore  is 4. GCS verbal subscore is 5. GCS motor subscore is 6.  Skin: Skin is warm and dry. No abrasion and no rash noted.  Psychiatric: She has a normal mood and affect. Her speech is normal and behavior is normal.  Nursing note and vitals reviewed.    ED Treatments / Results  Labs (all labs ordered are listed, but only abnormal results are displayed) Labs Reviewed  CBC - Abnormal; Notable for the following components:      Result Value   RBC 3.21 (*)    Hemoglobin 11.8 (*)    HCT 34.9 (*)    MCV 108.7 (*)    MCH 36.8 (*)    All other components within normal limits  LIPASE, BLOOD  COMPREHENSIVE METABOLIC PANEL  URINALYSIS, ROUTINE W REFLEX MICROSCOPIC  I-STAT TROPONIN, ED    EKG EKG Interpretation  Date/Time:  Sunday June 29 2018 11:51:38 EDT Ventricular Rate:  59 PR Interval:  174 QRS Duration: 86 QT Interval:  442 QTC Calculation: 437 R Axis:   58 Text Interpretation:  Sinus bradycardia Otherwise normal ECG No significant change since last tracing Confirmed by Lacretia Leigh ([id number]) on 06/17/2018 12:26:53 PM   Radiology No results found.  Procedures Procedures (including critical care time)  Medications Ordered in ED Medications  sodium chloride 0.9 % bolus 1,000 mL (has no administration in time range)  0.9 %  sodium chloride infusion (has no administration in time range)     Initial Impression / Assessment and Plan / ED Course  I have reviewed the triage vital signs and the nursing notes.  Pertinent labs & imaging results that were available during my care of the patient were reviewed by me and considered in my medical decision making (see chart for details).     Patient given IV fluids here for possible dehydration.  Complains of diffuse weakness.  Urinalysis shows that she has a UTI.  She also has mild hyponatremia with sodium of 128.  Has been 5.9 likely hemolysis as her creatinine is normal.  Will consult hospitalist for admission  Final Clinical  Impressions(s) / ED Diagnoses   Final diagnoses:  None    ED  Discharge Orders    None       Lacretia Leigh, MD 06/13/2018 [phone]

## 2018-06-30 ENCOUNTER — Other Ambulatory Visit: Payer: Self-pay | Admitting: *Deleted

## 2018-06-30 DIAGNOSIS — R6521 Severe sepsis with septic shock: Secondary | ICD-10-CM | POA: Diagnosis not present

## 2018-06-30 DIAGNOSIS — D62 Acute posthemorrhagic anemia: Secondary | ICD-10-CM | POA: Diagnosis not present

## 2018-06-30 DIAGNOSIS — J9601 Acute respiratory failure with hypoxia: Secondary | ICD-10-CM | POA: Diagnosis not present

## 2018-06-30 DIAGNOSIS — I10 Essential (primary) hypertension: Secondary | ICD-10-CM

## 2018-06-30 DIAGNOSIS — K5901 Slow transit constipation: Secondary | ICD-10-CM | POA: Diagnosis not present

## 2018-06-30 DIAGNOSIS — Z66 Do not resuscitate: Secondary | ICD-10-CM | POA: Diagnosis not present

## 2018-06-30 DIAGNOSIS — E639 Nutritional deficiency, unspecified: Secondary | ICD-10-CM | POA: Diagnosis not present

## 2018-06-30 DIAGNOSIS — R109 Unspecified abdominal pain: Secondary | ICD-10-CM | POA: Diagnosis not present

## 2018-06-30 DIAGNOSIS — Z515 Encounter for palliative care: Secondary | ICD-10-CM | POA: Diagnosis not present

## 2018-06-30 DIAGNOSIS — K3189 Other diseases of stomach and duodenum: Secondary | ICD-10-CM | POA: Diagnosis not present

## 2018-06-30 DIAGNOSIS — K5900 Constipation, unspecified: Secondary | ICD-10-CM | POA: Diagnosis not present

## 2018-06-30 DIAGNOSIS — M4712 Other spondylosis with myelopathy, cervical region: Secondary | ICD-10-CM | POA: Diagnosis present

## 2018-06-30 DIAGNOSIS — I4892 Unspecified atrial flutter: Secondary | ICD-10-CM | POA: Diagnosis present

## 2018-06-30 DIAGNOSIS — K560 Paralytic ileus: Secondary | ICD-10-CM | POA: Diagnosis not present

## 2018-06-30 DIAGNOSIS — K9189 Other postprocedural complications and disorders of digestive system: Secondary | ICD-10-CM | POA: Diagnosis not present

## 2018-06-30 DIAGNOSIS — R34 Anuria and oliguria: Secondary | ICD-10-CM | POA: Diagnosis not present

## 2018-06-30 DIAGNOSIS — E785 Hyperlipidemia, unspecified: Secondary | ICD-10-CM | POA: Diagnosis present

## 2018-06-30 DIAGNOSIS — E871 Hypo-osmolality and hyponatremia: Secondary | ICD-10-CM

## 2018-06-30 DIAGNOSIS — M05711 Rheumatoid arthritis with rheumatoid factor of right shoulder without organ or systems involvement: Secondary | ICD-10-CM | POA: Diagnosis not present

## 2018-06-30 DIAGNOSIS — A419 Sepsis, unspecified organism: Secondary | ICD-10-CM | POA: Diagnosis not present

## 2018-06-30 DIAGNOSIS — J690 Pneumonitis due to inhalation of food and vomit: Secondary | ICD-10-CM | POA: Diagnosis not present

## 2018-06-30 DIAGNOSIS — I471 Supraventricular tachycardia: Secondary | ICD-10-CM | POA: Diagnosis not present

## 2018-06-30 DIAGNOSIS — E860 Dehydration: Secondary | ICD-10-CM | POA: Diagnosis not present

## 2018-06-30 DIAGNOSIS — I480 Paroxysmal atrial fibrillation: Secondary | ICD-10-CM | POA: Diagnosis not present

## 2018-06-30 DIAGNOSIS — R918 Other nonspecific abnormal finding of lung field: Secondary | ICD-10-CM | POA: Diagnosis not present

## 2018-06-30 DIAGNOSIS — E440 Moderate protein-calorie malnutrition: Secondary | ICD-10-CM | POA: Diagnosis not present

## 2018-06-30 DIAGNOSIS — K311 Adult hypertrophic pyloric stenosis: Secondary | ICD-10-CM | POA: Diagnosis not present

## 2018-06-30 DIAGNOSIS — I5042 Chronic combined systolic (congestive) and diastolic (congestive) heart failure: Secondary | ICD-10-CM | POA: Diagnosis present

## 2018-06-30 DIAGNOSIS — K599 Functional intestinal disorder, unspecified: Secondary | ICD-10-CM | POA: Diagnosis not present

## 2018-06-30 DIAGNOSIS — K3184 Gastroparesis: Secondary | ICD-10-CM | POA: Diagnosis not present

## 2018-06-30 DIAGNOSIS — K9421 Gastrostomy hemorrhage: Secondary | ICD-10-CM | POA: Diagnosis not present

## 2018-06-30 DIAGNOSIS — I350 Nonrheumatic aortic (valve) stenosis: Secondary | ICD-10-CM | POA: Diagnosis not present

## 2018-06-30 DIAGNOSIS — Z4682 Encounter for fitting and adjustment of non-vascular catheter: Secondary | ICD-10-CM | POA: Diagnosis not present

## 2018-06-30 DIAGNOSIS — R140 Abdominal distension (gaseous): Secondary | ICD-10-CM | POA: Diagnosis not present

## 2018-06-30 DIAGNOSIS — I429 Cardiomyopathy, unspecified: Secondary | ICD-10-CM | POA: Diagnosis not present

## 2018-06-30 DIAGNOSIS — K567 Ileus, unspecified: Secondary | ICD-10-CM | POA: Diagnosis not present

## 2018-06-30 DIAGNOSIS — N390 Urinary tract infection, site not specified: Secondary | ICD-10-CM

## 2018-06-30 DIAGNOSIS — M05712 Rheumatoid arthritis with rheumatoid factor of left shoulder without organ or systems involvement: Secondary | ICD-10-CM | POA: Diagnosis not present

## 2018-06-30 DIAGNOSIS — E2740 Unspecified adrenocortical insufficiency: Secondary | ICD-10-CM | POA: Diagnosis not present

## 2018-06-30 DIAGNOSIS — Z8719 Personal history of other diseases of the digestive system: Secondary | ICD-10-CM | POA: Diagnosis not present

## 2018-06-30 DIAGNOSIS — N179 Acute kidney failure, unspecified: Secondary | ICD-10-CM | POA: Diagnosis present

## 2018-06-30 DIAGNOSIS — E872 Acidosis: Secondary | ICD-10-CM | POA: Diagnosis not present

## 2018-06-30 DIAGNOSIS — K9423 Gastrostomy malfunction: Secondary | ICD-10-CM | POA: Diagnosis not present

## 2018-06-30 DIAGNOSIS — J9811 Atelectasis: Secondary | ICD-10-CM | POA: Diagnosis present

## 2018-06-30 DIAGNOSIS — K56609 Unspecified intestinal obstruction, unspecified as to partial versus complete obstruction: Secondary | ICD-10-CM | POA: Diagnosis not present

## 2018-06-30 DIAGNOSIS — J810 Acute pulmonary edema: Secondary | ICD-10-CM | POA: Diagnosis not present

## 2018-06-30 DIAGNOSIS — Z01810 Encounter for preprocedural cardiovascular examination: Secondary | ICD-10-CM | POA: Diagnosis not present

## 2018-06-30 DIAGNOSIS — E43 Unspecified severe protein-calorie malnutrition: Secondary | ICD-10-CM | POA: Diagnosis not present

## 2018-06-30 DIAGNOSIS — Z7189 Other specified counseling: Secondary | ICD-10-CM | POA: Diagnosis not present

## 2018-06-30 DIAGNOSIS — I2510 Atherosclerotic heart disease of native coronary artery without angina pectoris: Secondary | ICD-10-CM | POA: Diagnosis not present

## 2018-06-30 DIAGNOSIS — R338 Other retention of urine: Secondary | ICD-10-CM | POA: Diagnosis not present

## 2018-06-30 LAB — BASIC METABOLIC PANEL
Anion gap: 7 (ref 5–15)
BUN: 9 mg/dL (ref 8–23)
CHLORIDE: 100 mmol/L (ref 98–111)
CO2: 23 mmol/L (ref 22–32)
CREATININE: 0.43 mg/dL — AB (ref 0.44–1.00)
Calcium: 7.7 mg/dL — ABNORMAL LOW (ref 8.9–10.3)
GFR calc Af Amer: >60 mL/min (ref >60)
GFR calc non Af Amer: >60 mL/min (ref >60)
Glucose, Bld: 104 mg/dL — ABNORMAL HIGH (ref 70–99)
POTASSIUM: 3.9 mmol/L (ref 3.5–5.1)
SODIUM: 130 mmol/L — AB (ref 135–145)

## 2018-06-30 LAB — URINE CULTURE

## 2018-06-30 MED ORDER — BISACODYL 10 MG RE SUPP
10.0000 mg | Freq: Every day | RECTAL | Status: DC | PRN
  Administered 2018-06-30 – 2018-07-07 (×2): 10 mg via RECTAL
  Filled 2018-06-30 (×3): qty 1

## 2018-06-30 MED ORDER — BISACODYL 5 MG PO TBEC
10.0000 mg | DELAYED_RELEASE_TABLET | Freq: Once | ORAL | Status: DC
  Filled 2018-06-30: qty 2

## 2018-06-30 MED ORDER — DOCUSATE SODIUM 100 MG PO CAPS
100.0000 mg | ORAL_CAPSULE | Freq: Two times a day (BID) | ORAL | Status: DC
  Administered 2018-06-30 – 2018-07-02 (×4): 100 mg via ORAL
  Filled 2018-06-30 (×4): qty 1

## 2018-06-30 MED ORDER — LACTULOSE 10 GM/15ML PO SOLN: 20.0000 g | ORAL | Status: DC

## 2018-06-30 MED ORDER — LACTULOSE 10 GM/15ML PO SOLN
20.0000 g | ORAL | Status: DC
  Administered 2018-06-30 (×3): 20 g via ORAL
  Filled 2018-06-30 (×2): qty 30

## 2018-06-30 MED ORDER — SODIUM CHLORIDE 0.9 % IV SOLN
1.0000 g | INTRAVENOUS | Status: DC
  Administered 2018-06-30 – 2018-07-02 (×3): 1 g via INTRAVENOUS
  Filled 2018-06-30 (×3): qty 10

## 2018-06-30 MED ORDER — BISACODYL 10 MG RE SUPP
10.0000 mg | Freq: Once | RECTAL | Status: AC
  Administered 2018-06-30: 10 mg via RECTAL

## 2018-06-30 MED ORDER — SODIUM CHLORIDE 0.9 % IV BOLUS
1000.0000 mL | Freq: Once | INTRAVENOUS | Status: AC
  Administered 2018-06-30: 1000 mL via INTRAVENOUS

## 2018-06-30 MED ORDER — LACTULOSE 10 GM/15ML PO SOLN
20.0000 g | Freq: Four times a day (QID) | ORAL | Status: AC
  Administered 2018-06-30 – 2018-07-01 (×2): 20 g via ORAL
  Filled 2018-06-30 (×3): qty 30

## 2018-06-30 MED ORDER — DIPHENHYDRAMINE HCL 25 MG PO CAPS
25.0000 mg | ORAL_CAPSULE | Freq: Once | ORAL | Status: AC
  Administered 2018-06-30: 25 mg via ORAL
  Filled 2018-06-30: qty 1

## 2018-06-30 NOTE — Consult Note (Signed)
   [facility] CM Inpatient Consult   06/30/2018  Angela [name] Jul 29, 1936 [id number]     Made aware of hospitalization by [place] LCSW. Patient recently assigned to [place] team by [place]. Please see chart review tab then encounters for patient outreach details.   Went to bedside to speak with Angela [name] about ongoing [facility] Management services. She is agreeable and written consent obtained. [facility] folder provided.  Confirmed best contact number for Angela [name] is [phone]. Confirmed Primary Care MD is Dr. Laurann Montana [facility] physicians listed as doing transition of care calls).  Angela [name] expresses appreciation of visit.  Made inpatient RNCM aware that [facility] is active.    Marthenia Rolling, MSN-Ed, RN,BSN [facility] Liaison [phone]

## 2018-06-30 NOTE — Progress Notes (Signed)
PROGRESS NOTE    Angela [name]  MVH:[id number] DOB: 03/19/1936 DOA: 06/24/2018 PCP: [name], MD   Brief Narrative:  82 year old with past medical history of severe pyloric stenosis, CAD, atrial fibrillation, hyperlipidemia, osteoporosis, peptic ulcer disease, rheumatoid arthritis came to the hospital with concerns of feeling very nauseous and abdominal discomfort.  In the ER abdominal x-ray showed large stool burden without any other acute pathology.  Sodium was noted to be 128 and potassium 5.9.  She was started on bowel regimen and admitted to the hospital.  With IV fluids her sodium improved to 130 and potassium trended down to 3.9.  UA was suggestive of possible urinary tract infection therefore started on IV Rocephin.   Assessment & Plan:   Principal Problem:   Constipation Active Problems:   Cervical spondylosis with myelopathy   Rheumatoid arthritis (HCC)   Hyponatremia   Dyslipidemia   Essential hypertension, benign   Aortic stenosis   CAD (coronary artery disease)   Gastric outlet obstruction   PAF (paroxysmal atrial fibrillation) (HCC)   Cardiomyopathy (HCC)   Severe constipation/obstipation with nausea -Abdominal x-ray suggestive of large volume stool burden.  Patient has been started on aggressive bowel regimen along with an advanced.  She has had minimal stool output, but this afternoon she started having some bowel movement.  Still feels slight abdominal discomfort and nausea. - Continue aggressive bowel regimen today.  I have advised her to use cool softeners as much as needed at home to have at least 1-2 soft bowel movements daily.  Mild urinary tract infection without hematuria - We will start the patient on Rocephin while hospitalized, change it to oral at the time of discharge.  Paroxysmal atrial fibrillation -Continue amiodarone 100 mg twice daily, Eliquis 5 mg twice daily.  History of CAD -Continue Eliquis, statin.  On Sectral and Lasix as  well.  Essential hypertension - Continue home Sectral and Lasix  GERD -Continue PPI  History of rheumatoid arthritis -On prednisone 5 mg orally daily.  History of depression -Lexapro 5 mg daily  Iron deficiency anemia - Iron supplements at bedtime.  She also take bowel regimen with this.   DVT prophylaxis: Eliquis Code Status: Full Code  Family Communication:  None at bedisde  Disposition Plan: She doesn't have any help at home and is still feeling nauseous with abdominal pain. Only minimal response to aggressive bowel regimen. Would keep her in the hospital for another day at least.   Consultants:   None  Procedures:   None  Antimicrobials:   IV Rocephin 8/26   Subjective: Patient states she still has abdominal discomfort with nausea.  Minimal bowel movement so far with aggressive bowel regimen.  Review of Systems Otherwise negative except as per HPI, including: General: Denies fever, chills, night sweats or unintended weight loss. Resp: Denies cough, wheezing, shortness of breath. Cardiac: Denies chest pain, palpitations, orthopnea, paroxysmal nocturnal dyspnea. GI: Denies abdominal pain, nausea, vomiting, diarrhea or constipation GU: Denies dysuria, frequency, hesitancy or incontinence MS: Denies muscle aches, joint pain or swelling Neuro: Denies headache, neurologic deficits (focal weakness, numbness, tingling), abnormal gait Psych: Denies anxiety, depression, SI/HI/AVH Skin: Denies new rashes or lesions ID: Denies sick contacts, exotic exposures, travel  Objective: Vitals:   06/30/18 0531 06/30/18 0547 06/30/18 0702 06/30/18 1445  BP: (!) 82/48 (!) 82/46 (!) 88/51 94/60  Pulse: (!) 59  62 64  Resp: 19   18  Temp: 99 F (37.2 C)   99 F (37.2 C)  TempSrc:  Oral     SpO2: 95%   93%  Weight:      Height:        Intake/Output Summary (Last 24 hours) at 06/30/2018 1547 Last data filed at 06/30/2018 0900 Gross per 24 hour  Intake 510.26 ml  Output  650 ml  Net -139.74 ml   Filed Weights   06/19/2018 1153  Weight: 48.5 kg    Examination:  General exam: Appears calm and comfortable ; elderly frail appearing.  Respiratory system: Clear to auscultation. Respiratory effort normal. Cardiovascular system: S1 & S2 heard, RRR. No JVD, murmurs, rubs, gallops or clicks. No pedal edema. Gastrointestinal system: Distended abdomen with diminished bowel sounds.  Tender to deep palpation.  No rebound tenderness. Central nervous system: Alert and oriented. No focal neurological deficits. Extremities: Symmetric 5 x 5 power. Skin: No rashes, lesions or ulcers Psychiatry: Judgement and insight appear normal. Mood & affect appropriate.     Data Reviewed:   CBC: Recent Labs  Lab 07/01/2018 1148  WBC 5.5  HGB 11.8*  HCT 34.9*  MCV 108.7*  PLT 462   Basic Metabolic Panel: Recent Labs  Lab 06/25/2018 1148 06/30/18 0500  NA 128* 130*  K 5.9* 3.9  CL 95* 100  CO2 24 23  GLUCOSE 105* 104*  BUN 12 9  CREATININE 0.61 0.43*  CALCIUM 8.4* 7.7*   GFR: Estimated Creatinine Clearance: 43 mL/min (A) (by C-G formula based on SCr of 0.43 mg/dL (L)). Liver Function Tests: Recent Labs  Lab 06/11/2018 1148  AST 65*  ALT 32  ALKPHOS 28*  BILITOT 2.2*  PROT 4.9*  ALBUMIN 3.4*   Recent Labs  Lab 07/05/2018 1148  LIPASE 34   No results for input(s): AMMONIA in the last 168 hours. Coagulation Profile: No results for input(s): INR, PROTIME in the last 168 hours. Cardiac Enzymes: No results for input(s): CKTOTAL, CKMB, CKMBINDEX, TROPONINI in the last 168 hours. BNP (last 3 results) No results for input(s): PROBNP in the last 8760 hours. HbA1C: No results for input(s): HGBA1C in the last 72 hours. CBG: No results for input(s): GLUCAP in the last 168 hours. Lipid Profile: No results for input(s): CHOL, HDL, LDLCALC, TRIG, CHOLHDL, LDLDIRECT in the last 72 hours. Thyroid Function Tests: No results for input(s): TSH, T4TOTAL, FREET4, T3FREE,  THYROIDAB in the last 72 hours. Anemia Panel: No results for input(s): VITAMINB12, FOLATE, FERRITIN, TIBC, IRON, RETICCTPCT in the last 72 hours. Sepsis Labs: No results for input(s): PROCALCITON, LATICACIDVEN in the last 168 hours.  Recent Results (from the past 240 hour(s))  Urine Culture     Status: Abnormal   Collection Time: 06/10/2018  1:47 PM  Result Value Ref Range Status   Specimen Description URINE, RANDOM  Final   Special Requests   Final    NONE Performed at [facility], [address], [place], [place] [address]    Culture MULTIPLE SPECIES PRESENT, SUGGEST RECOLLECTION (A)  Final   Report Status 06/30/2018 FINAL  Final         Radiology Studies: Dg Abd Acute W/chest  Result Date: 06/30/2018 CLINICAL DATA:  Nausea. EXAM: DG ABDOMEN ACUTE W/ 1V CHEST COMPARISON:  Radiographs of May 14, 2018. FINDINGS: Large amount of stool seen throughout the colon. No abnormal bowel dilatation is noted. No radiopaque calculi or other significant radiographic abnormality is seen. Stable cardiomegaly. Status post cholecystectomy. Atherosclerosis of thoracic aorta is noted. Both lungs are clear. IMPRESSION: Large stool burden is noted. No abnormal bowel dilatation  is noted. No acute cardiopulmonary disease. Aortic Atherosclerosis (ICD10-I70.0). Electronically Signed   By: Marijo Conception, M.D.   On: 06/07/2018 13:23        Scheduled Meds: . acebutolol  200 mg Oral Daily  . amiodarone  100 mg Oral BID  . apixaban  5 mg Oral BID  . calcium-vitamin D  1 tablet Oral BID  . diclofenac sodium  2 g Topical QID  . escitalopram  5 mg Oral Daily  . ferrous sulfate  325 mg Oral QHS  . fesoterodine  8 mg Oral Daily  . folic acid  1 mg Oral Daily  . furosemide  40 mg Oral Daily  . lactulose  20 g Oral Q3H  . LORazepam  2 mg Oral QHS  . metoCLOPramide (REGLAN) injection  5 mg Intravenous Q6H  . pantoprazole  40 mg Oral BID  . polyethylene glycol  17 g Oral BID  . predniSONE  5 mg  Oral Q breakfast  . rosuvastatin  10 mg Oral QHS  . sodium chloride flush  3 mL Intravenous Q12H  . vitamin B-12  1,000 mcg Oral Daily  . vitamin C  500 mg Oral Daily   Continuous Infusions: . sodium chloride    . sodium chloride 125 mL/hr at 06/30/18 0941  . cefTRIAXone (ROCEPHIN)  IV 1 g (06/30/18 0805)     LOS: 0 days    I have spent 26 minutes face to face with the patient and on the ward discussing the patients care, assessment, plan and disposition with other care givers. >50% of the time was devoted counseling the patient about the risks and benefits of treatment and coordinating care.     Nehan Flaum Arsenio Loader, MD [facility] Pager [phone]   If 7PM-7AM, please contact night-coverage [email or web address] Password [facility] 06/30/2018, 3:47 PM

## 2018-06-30 NOTE — Progress Notes (Signed)
RN paged C. Bodenheimer, NP to make him aware patient's BP is 82/48 automatic and 82/46 manually.  Patient asymptomatic.  Awaiting response to page.  RN will continue to monitor patient and make MD aware of any changes.  P.J. Linus Mako, RN

## 2018-06-30 NOTE — Patient Outreach (Signed)
[facility]) Care Management  06/30/2018  DANIEL JOHNDROW 08/16/1936 [id number]   CSW attempted to reach pt today by phone for initial phone outreach screening. CSW was informed by a family member that she has been admitted to [place] advised family of plans to follow via Epic and to outreach again when appropriate.  CSW will advise [facility] Liaison of above.   Eduard Clos, MSW, [place] Worker  [place] [phone]

## 2018-06-30 NOTE — Progress Notes (Signed)
SSE given per order, small brown type 1 stool produced. P.J. Linus Mako, RN

## 2018-07-01 ENCOUNTER — Inpatient Hospital Stay (HOSPITAL_COMMUNITY): Payer: Medicare Other

## 2018-07-01 DIAGNOSIS — M05711 Rheumatoid arthritis with rheumatoid factor of right shoulder without organ or systems involvement: Secondary | ICD-10-CM

## 2018-07-01 DIAGNOSIS — R140 Abdominal distension (gaseous): Secondary | ICD-10-CM

## 2018-07-01 DIAGNOSIS — M05712 Rheumatoid arthritis with rheumatoid factor of left shoulder without organ or systems involvement: Secondary | ICD-10-CM

## 2018-07-01 MED ORDER — BARIUM SULFATE 2.1 % PO SUSP
ORAL | Status: AC
  Administered 2018-07-01: 10:00:00
  Filled 2018-07-01: qty 2

## 2018-07-01 NOTE — Progress Notes (Signed)
Pt c/o abdominal distension  And tenderness paged schoor ordered dulcolax  Suppository  Will continue to monitor pt

## 2018-07-01 NOTE — Progress Notes (Signed)
PROGRESS NOTE    Angela [name]  EQA:[id number] DOB: 07/22/1936 DOA: 06/16/2018 PCP: [name], MD   Brief Narrative:  82 year old with past medical history of severe pyloric stenosis, CAD, atrial fibrillation, hyperlipidemia, osteoporosis, peptic ulcer disease, rheumatoid arthritis came to the hospital with concerns of feeling very nauseous and abdominal discomfort.  In the ER abdominal x-ray showed large stool burden without any other acute pathology.  Sodium was noted to be 128 and potassium 5.9.  She was started on bowel regimen and admitted to the hospital.  With IV fluids her sodium improved to 130 and potassium trended down to 3.9.  UA was suggestive of possible urinary tract infection therefore started on IV Rocephin.  After starting the patient on aggressive bowel regimen she had few bowel movements but her abdomen appears slightly more distended.  CT of the abdomen pelvis was ordered.   Assessment & Plan:   Principal Problem:   Constipation Active Problems:   Cervical spondylosis with myelopathy   Rheumatoid arthritis (HCC)   Hyponatremia   Dyslipidemia   Essential hypertension, benign   Aortic stenosis   CAD (coronary artery disease)   Gastric outlet obstruction   PAF (paroxysmal atrial fibrillation) (HCC)   Cardiomyopathy (HCC)   Severe constipation/obstipation with nausea; improving Abdominal Distention.  -Initial abdominal x-ray showed large volume stool burden therefore she was started on aggressive bowel regimen.  Currently having few bowel movements in the last 24 hours but her abdominal distention has worsened. Wonder if she may end up requiring NGT for some decompression.  -We will get CT of the abdomen pelvis to rule out any other acute intra-abdominal pathology such as Obstruction. May require NGT.  -  I have advised her to use cool softeners as much as needed at home to have at least 1-2 soft bowel movements daily.  Mild urinary tract infection without  hematuria - Patient on Rocephin D2, Cultures sent.   Paroxysmal atrial fibrillation -Continue amiodarone 100 mg twice daily, Eliquis 5 mg twice daily.  History of CAD -Continue Eliquis, statin.  On Sectral and Lasix as well.  Essential hypertension - Continue home Sectral and Lasix  GERD -Continue PPI  History of rheumatoid arthritis -On prednisone 5 mg orally daily.  History of depression -Lexapro 5 mg daily  Iron deficiency anemia - Iron supplements at bedtime.  She also take bowel regimen with this.   DVT prophylaxis: Eliquis Code Status: Full Code  Family Communication:  None at bedisde  Disposition Plan: Due to worsening of her abd distention, she needs further inpatient eval. Do not believe she will be safe to go home without more improvement in her clinical status.   Consultants:   None  Procedures:   None  Antimicrobials:   IV Rocephin 8/26   Subjective: Patient had few episodes of bowel movements overnight. But her abd feels slightly more distended.   Review of Systems Otherwise negative except as per HPI, including: General = no fevers, chills, dizziness, malaise, fatigue HEENT/EYES = negative for pain, redness, loss of vision, double vision, blurred vision, loss of hearing, sore throat, hoarseness, dysphagia Cardiovascular= negative for chest pain, palpitation, murmurs, lower extremity swelling Respiratory/lungs= negative for shortness of breath, cough, hemoptysis, wheezing, mucus production Gastrointestinal= negative for melena, hematemesis Genitourinary= negative for Dysuria, Hematuria, Change in Urinary Frequency MSK = Negative for arthralgia, myalgias, Back Pain, Joint swelling  Neurology= Negative for headache, seizures, numbness, tingling  Psychiatry= Negative for anxiety, depression, suicidal and homocidal ideation Allergy/Immunology= Medication/Food allergy as  listed  Skin= Negative for Rash, lesions, ulcers,  itching   Objective: Vitals:   06/30/18 0702 06/30/18 1445 06/30/18 2049 07/01/18 0453  BP: (!) 88/51 94/60 (!) 105/93 (!) 93/51  Pulse: 62 64 65 64  Resp:  18 18 18   Temp:  99 F (37.2 C) 98.4 F (36.9 C) 98.2 F (36.8 C)  TempSrc:   Oral Oral  SpO2:  93% 96% 90%  Weight:      Height:        Intake/Output Summary (Last 24 hours) at 07/01/2018 1013 Last data filed at 07/01/2018 1009 Gross per 24 hour  Intake 343 ml  Output 900 ml  Net -557 ml   Filed Weights   07/05/2018 1153  Weight: 48.5 kg    Examination:  Constitutional: NAD, calm, comfortable; elderly frail appearing.  Eyes: PERRL, lids and conjunctivae normal ENMT: Mucous membranes are moist. Posterior pharynx clear of any exudate or lesions.Normal dentition.  Neck: normal, supple, no masses, no thyromegaly Respiratory: clear to auscultation bilaterally, no wheezing, no crackles. Normal respiratory effort. No accessory muscle use.  Cardiovascular: Regular rate and rhythm, no murmurs / rubs / gallops. No extremity edema. 2+ pedal pulses. No carotid bruits.  Abdomen: + Bowel sounds but abd is distended at this time.   Musculoskeletal: no clubbing / cyanosis. No joint deformity upper and lower extremities. Good ROM, no contractures. Normal muscle tone.  Skin: no rashes, lesions, ulcers. No induration Neurologic: CN 2-12 grossly intact. Sensation intact, DTR normal. Strength 4/5 in all 4.  Psychiatric: Normal judgment and insight. Alert and oriented x 3. Normal mood.    Data Reviewed:   CBC: Recent Labs  Lab 06/24/2018 1148  WBC 5.5  HGB 11.8*  HCT 34.9*  MCV 108.7*  PLT 833   Basic Metabolic Panel: Recent Labs  Lab 06/20/2018 1148 06/30/18 0500  NA 128* 130*  K 5.9* 3.9  CL 95* 100  CO2 24 23  GLUCOSE 105* 104*  BUN 12 9  CREATININE 0.61 0.43*  CALCIUM 8.4* 7.7*   GFR: Estimated Creatinine Clearance: 43 mL/min (A) (by C-G formula based on SCr of 0.43 mg/dL (L)). Liver Function Tests: Recent Labs   Lab 06/26/2018 1148  AST 65*  ALT 32  ALKPHOS 28*  BILITOT 2.2*  PROT 4.9*  ALBUMIN 3.4*   Recent Labs  Lab 06/27/2018 1148  LIPASE 34   No results for input(s): AMMONIA in the last 168 hours. Coagulation Profile: No results for input(s): INR, PROTIME in the last 168 hours. Cardiac Enzymes: No results for input(s): CKTOTAL, CKMB, CKMBINDEX, TROPONINI in the last 168 hours. BNP (last 3 results) No results for input(s): PROBNP in the last 8760 hours. HbA1C: No results for input(s): HGBA1C in the last 72 hours. CBG: No results for input(s): GLUCAP in the last 168 hours. Lipid Profile: No results for input(s): CHOL, HDL, LDLCALC, TRIG, CHOLHDL, LDLDIRECT in the last 72 hours. Thyroid Function Tests: No results for input(s): TSH, T4TOTAL, FREET4, T3FREE, THYROIDAB in the last 72 hours. Anemia Panel: No results for input(s): VITAMINB12, FOLATE, FERRITIN, TIBC, IRON, RETICCTPCT in the last 72 hours. Sepsis Labs: No results for input(s): PROCALCITON, LATICACIDVEN in the last 168 hours.  Recent Results (from the past 240 hour(s))  Urine Culture     Status: Abnormal   Collection Time: 06/24/2018  1:47 PM  Result Value Ref Range Status   Specimen Description URINE, RANDOM  Final   Special Requests   Final    NONE Performed at [facility]  [facility], [place] [address], [place],  [address]    Culture MULTIPLE SPECIES PRESENT, SUGGEST RECOLLECTION (A)  Final   Report Status 06/30/2018 FINAL  Final         Radiology Studies: Dg Abd Acute W/chest  Result Date: 06/11/2018 CLINICAL DATA:  Nausea. EXAM: DG ABDOMEN ACUTE W/ 1V CHEST COMPARISON:  Radiographs of May 14, 2018. FINDINGS: Large amount of stool seen throughout the colon. No abnormal bowel dilatation is noted. No radiopaque calculi or other significant radiographic abnormality is seen. Stable cardiomegaly. Status post cholecystectomy. Atherosclerosis of thoracic aorta is noted. Both lungs are clear. IMPRESSION: Large stool  burden is noted. No abnormal bowel dilatation is noted. No acute cardiopulmonary disease. Aortic Atherosclerosis (ICD10-I70.0). Electronically Signed   By: Marijo Conception, M.D.   On: 06/17/2018 13:23        Scheduled Meds: . acebutolol  200 mg Oral Daily  . amiodarone  100 mg Oral BID  . apixaban  5 mg Oral BID  . Barium Sulfate      . calcium-vitamin D  1 tablet Oral BID  . diclofenac sodium  2 g Topical QID  . docusate sodium  100 mg Oral BID  . escitalopram  5 mg Oral Daily  . ferrous sulfate  325 mg Oral QHS  . fesoterodine  8 mg Oral Daily  . folic acid  1 mg Oral Daily  . furosemide  40 mg Oral Daily  . lactulose  20 g Oral Q6H  . LORazepam  2 mg Oral QHS  . pantoprazole  40 mg Oral BID  . polyethylene glycol  17 g Oral BID  . predniSONE  5 mg Oral Q breakfast  . rosuvastatin  10 mg Oral QHS  . sodium chloride flush  3 mL Intravenous Q12H  . vitamin B-12  1,000 mcg Oral Daily  . vitamin C  500 mg Oral Daily   Continuous Infusions: . sodium chloride    . sodium chloride 125 mL/hr at 07/01/18 0259  . cefTRIAXone (ROCEPHIN)  IV 1 g (07/01/18 0627)     LOS: 1 day    I have spent 25 minutes face to face with the patient and on the ward discussing the patients care, assessment, plan and disposition with other care givers. >50% of the time was devoted counseling the patient about the risks and benefits of treatment and coordinating care.     Milana Salay Arsenio Loader, MD [facility] Pager [phone]   If 7PM-7AM, please contact night-coverage [email or web address] Password [facility] 07/01/2018, 10:13 AM

## 2018-07-01 NOTE — Progress Notes (Signed)
Pt poop x3 per pt feel much better and not feeling bloated anymore will continue to monitor

## 2018-07-02 ENCOUNTER — Other Ambulatory Visit: Payer: Medicare Other

## 2018-07-02 ENCOUNTER — Inpatient Hospital Stay (HOSPITAL_COMMUNITY): Payer: Medicare Other

## 2018-07-02 MED ORDER — DEXTROSE-NACL 5-0.45 % IV SOLN: INTRAVENOUS | Status: DC

## 2018-07-02 MED ORDER — MORPHINE SULFATE (PF) 2 MG/ML IV SOLN
2.0000 mg | INTRAVENOUS | Status: AC | PRN
  Administered 2018-07-02 – 2018-07-09 (×3): 2 mg via INTRAVENOUS
  Filled 2018-07-02 (×4): qty 1

## 2018-07-02 MED ORDER — LORAZEPAM 2 MG/ML IJ SOLN
0.5000 mg | Freq: Once | INTRAMUSCULAR | Status: AC
  Administered 2018-07-02: 0.5 mg via INTRAVENOUS
  Filled 2018-07-02: qty 1

## 2018-07-02 MED ORDER — DEXTROSE-NACL 5-0.9 % IV SOLN
INTRAVENOUS | Status: DC
  Administered 2018-07-02 – 2018-07-05 (×2): via INTRAVENOUS

## 2018-07-02 NOTE — Progress Notes (Signed)
Placed NG tube at 4pm drained 1037ml of dark green fluid in first hour.  Will continue to monitor.

## 2018-07-02 NOTE — Progress Notes (Signed)
Pt c/o feeling SOB kept on 2l SPO2 nasal cannular will continue to monitor

## 2018-07-02 NOTE — Progress Notes (Signed)
Patient Demographics:    Angela [name], is a 82 y.o. female, DOB - August 06, 1936, ION:[id number]  Admit date - 06/18/2018   Admitting Physician Angela Deutscher, MD  Outpatient Primary MD for the patient is Angela Orn, MD  LOS - 2   Chief Complaint  Patient presents with  . Failure To Thrive  . Nausea        Subjective:    Angela [name] today has no fevers,  ,  No chest pain, brother at bedside, complaining of nausea, having BMs, appetite is poor,  Assessment  & Plan :    Principal Problem:   Constipation Active Problems:   Cervical spondylosis with myelopathy   Rheumatoid arthritis (HCC)   Hyponatremia   Dyslipidemia   Essential hypertension, benign   Aortic stenosis   CAD (coronary artery disease)   Gastric outlet obstruction   PAF (paroxysmal atrial fibrillation) (HCC)   Cardiomyopathy (HCC)  Brief Narrative:  82 year old with past medical history of severe pyloric stenosis, CAD, atrial fibrillation, hyperlipidemia, osteoporosis, peptic ulcer disease, rheumatoid arthritis came to the hospital with concerns of feeling very nauseous and abdominal discomfort.  In the ER abdominal x-ray showed large stool burden without any other acute pathology.  Sodium was noted to be 128 and potassium 5.9.  She was started on bowel regimen and admitted to the hospital.  With IV fluids her sodium improved to 130 and potassium trended down to 3.9.  UA was suggestive of possible urinary tract infection therefore started on IV Rocephin.  After starting the patient on aggressive bowel regimen she had few bowel movements but her abdomen appears slightly more distended.  CT of the abdomen pelvis showed severe gastric distention due to severe pyloric obstruction   Plan:- 1) severe pyloric stenosis--- resulting in severe gastric distention and recurrent nausea vomiting, poor oral intake, weight loss and protein caloric  malnutrition--- failed multiple attempts at EGD with dilatation, last dilatation was 06/17/2018, GI and surgical consult appreciated , patient will most likely need gastrojejunal bypass, the meantime NG tube placed for decompression, patient will have upper GI series on 07/03/2018, hold Eliquis to allow for procedures  2) GI motility disorder with constipation--- constipation has resolved with laxatives, patient had multiple BMs  3)PAFib--- stable, continue amiodarone 100 mg twice daily, hold Eliquis to allow for procedures, consider heparin bridge  4)H/o CAD--- last stent apparently around 2008, no ACS type symptoms at this time, c/n acebutolol 200 mg daily and crestor 10 mg   5)Rheumatoid Arthritis---, continue prednisone 5 mg daily  Code Status : full code  Disposition Plan  : TBD  Consults  :  Gi/Gen surg  DVT Prophylaxis  :    SCDs (hold Eliquis to allow for procedures)  Lab Results  Component Value Date   PLT 150 06/21/2018    Inpatient Medications  Scheduled Meds: . acebutolol  200 mg Oral Daily  . amiodarone  100 mg Oral BID  . apixaban  5 mg Oral BID  . calcium-vitamin D  1 tablet Oral BID  . diclofenac sodium  2 g Topical QID  . docusate sodium  100 mg Oral BID  . escitalopram  5 mg Oral Daily  . ferrous sulfate  325 mg Oral QHS  .  fesoterodine  8 mg Oral Daily  . folic acid  1 mg Oral Daily  . furosemide  40 mg Oral Daily  . LORazepam  2 mg Oral QHS  . pantoprazole  40 mg Oral BID  . polyethylene glycol  17 g Oral BID  . predniSONE  5 mg Oral Q breakfast  . rosuvastatin  10 mg Oral QHS  . sodium chloride flush  3 mL Intravenous Q12H  . vitamin B-12  1,000 mcg Oral Daily  . vitamin C  500 mg Oral Daily   Continuous Infusions: . sodium chloride    . sodium chloride 125 mL/hr at 07/02/18 0522  . cefTRIAXone (ROCEPHIN)  IV 1 g (07/02/18 0639)   PRN Meds:.sodium chloride, acetaminophen **OR** acetaminophen, bisacodyl, nitroGLYCERIN, ondansetron **OR**  ondansetron (ZOFRAN) IV, oxyCODONE-acetaminophen, propranolol, sodium chloride flush    Anti-infectives (From admission, onward)   Start     Dose/Rate Route Frequency Ordered Stop   06/30/18 0645  cefTRIAXone (ROCEPHIN) 1 g in sodium chloride 0.9 % 100 mL IVPB     1 g 200 mL/hr over 30 Minutes Intravenous Every 24 hours 06/30/18 0643     07/05/2018 1500  cefTRIAXone (ROCEPHIN) 1 g in sodium chloride 0.9 % 100 mL IVPB  Status:  Discontinued     1 g 200 mL/hr over 30 Minutes Intravenous Every 24 hours 06/28/2018 1451 07/04/2018 1528        Objective:   Vitals:   07/01/18 1432 07/01/18 2223 07/02/18 0509 07/02/18 1605  BP: 98/61 107/61 (!) 106/59 118/60  Pulse: 62 62  65  Resp: 18 16 16 20   Temp: 98.4 F (36.9 C) 98.2 F (36.8 C) 97.8 F (36.6 C)   TempSrc: Oral Oral Oral   SpO2: 97% 98% 95% 99%  Weight:      Height:        Wt Readings from Last 3 Encounters:  06/05/2018 48.5 kg  06/17/18 48.5 kg  06/03/18 48.9 kg     Intake/Output Summary (Last 24 hours) at 07/02/2018 1650 Last data filed at 07/01/2018 2135 Gross per 24 hour  Intake 240 ml  Output -  Net 240 ml     Physical Exam  Gen:- Awake Alert,  In no apparent distress  HEENT:- [place].AT, No sclera icterus Neck-Supple Neck,No JVD,.  Lungs-  CTAB , good air movement CV- S1, S2 normal Abd-  +ve B.Sounds, distended with generalized tenderness,,    Extremity/Skin:-Good pulses Psych-affect is appropriate, oriented x3 Neuro-no new focal deficits, no tremors   Data Review:   Micro Results Recent Results (from the past 240 hour(s))  Urine Culture     Status: Abnormal   Collection Time: 07/04/2018  1:47 PM  Result Value Ref Range Status   Specimen Description URINE, RANDOM  Final   Special Requests   Final    NONE Performed at [facility], [place] [address] [place], [place] [address]    Culture MULTIPLE SPECIES PRESENT, SUGGEST RECOLLECTION (A)  Final   Report Status 06/30/2018 FINAL  Final    Radiology  Reports Ct Abdomen Pelvis Wo Contrast  Result Date: 07/01/2018 CLINICAL DATA:  Abdominal distention, pain, nausea, history of severe pyloric stenosis EXAM: CT ABDOMEN AND PELVIS WITHOUT CONTRAST TECHNIQUE: Multidetector CT imaging of the abdomen and pelvis was performed following the standard protocol without IV contrast. COMPARISON:  Chest and abdomen films of 06/09/2017 and CT abdomen pelvis of 01/10/2016 FINDINGS: Lower chest: There are moderate-sized bilateral pleural effusions present with bibasilar linear atelectasis.  Moderate cardiomegaly is again noted. No pericardial effusion is seen. Hepatobiliary: The liver is unremarkable in this unenhanced study. Surgical clips are present from prior cholecystectomy. Pancreas: Pancreas appears atrophic. The pancreatic duct is not dilated. Spleen: The spleen is unremarkable with calcified splenic artery noted. Adrenals/Urinary Tract: Adrenal glands appear normal. No renal calculi are seen and there is no evidence of hydronephrosis. The ureters do not appear to be dilated there are difficult to follow in this patient. The urinary bladder is distended with no abnormality evident. Stomach/Bowel: The stomach is massively distended with fluid and food debris as well as oral contrast within this markedly distended stomach. In this patient with history of severe pyloric stenosis, recurrence of severe pyloric stenosis is the primary consideration. No definite mass is seen. No small bowel distention is seen. The mucosa of the ano-rectal region is somewhat thickened of questionable significance. Clinical correlation is recommended. The majority of the colon is decompressed and very difficult to assess. The terminal ileum is poorly visualized but the appendix does appear to fill with air and is unremarkable. Vascular/Lymphatic: Moderate abdominal aortic atherosclerosis is present. No aneurysm is noted. There is no evidence of adenopathy. Reproductive: The uterus is somewhat  atrophic. No adnexal lesion is seen. No fluid is noted within the pelvis. Other: No abdominal wall hernia is seen. Musculoskeletal: There is anterolisthesis of L4 on L5 by 5 mm and anterolisthesis of L3 on L4 by 8 mm. Both of these malalignments appear to be due to degenerative change of the facet joints. There is considerable degenerative disc disease at both L3-4 and L4-5 levels. No compression deformity is seen. There is mild degenerative joint disease of the hips present. IMPRESSION: 1. Massively distended stomach with food and fluid present. This most likely is due to recurrence of severe pyloric stenosis by history. 2. Circumferential thickening of the a no rectal mucosa of questionable significance. Correlate clinically. 3. Degenerative disc disease at L3-4 and L4-5 as noted above. 4. Moderate-sized bilateral pleural effusions with bibasilar atelectasis. Cardiomegaly. Electronically Signed   By: Ivar Drape M.D.   On: 07/01/2018 13:28   Dg Abd Acute W/chest  Result Date: 07/02/2018 CLINICAL DATA:  Nausea. EXAM: DG ABDOMEN ACUTE W/ 1V CHEST COMPARISON:  Radiographs of May 14, 2018. FINDINGS: Large amount of stool seen throughout the colon. No abnormal bowel dilatation is noted. No radiopaque calculi or other significant radiographic abnormality is seen. Stable cardiomegaly. Status post cholecystectomy. Atherosclerosis of thoracic aorta is noted. Both lungs are clear. IMPRESSION: Large stool burden is noted. No abnormal bowel dilatation is noted. No acute cardiopulmonary disease. Aortic Atherosclerosis (ICD10-I70.0). Electronically Signed   By: Marijo Conception, M.D.   On: 06/07/2018 13:23   Dg Abd Portable 1v  Result Date: 07/02/2018 CLINICAL DATA:  Insertion of nasogastric tube EXAM: PORTABLE ABDOMEN - 1 VIEW COMPARISON:  Portable exam 1620 hours compared to 07/05/2018 FINDINGS: Nasogastric tube present with tip projecting over mid stomach. Scattered stool throughout colon. No definite bowel wall  thickening or obstruction. Bones demineralized. Scattered vascular calcifications. IMPRESSION: Tip of nasogastric tube projects over mid stomach. Electronically Signed   By: Lavonia Dana M.D.   On: 07/02/2018 16:38     CBC Recent Labs  Lab 06/05/2018 1148  WBC 5.5  HGB 11.8*  HCT 34.9*  PLT 150  MCV 108.7*  MCH 36.8*  MCHC 33.8  RDW 13.3    Chemistries  Recent Labs  Lab 06/26/2018 1148 06/30/18 0500  NA 128* 130*  K 5.9*  3.9  CL 95* 100  CO2 24 23  GLUCOSE 105* 104*  BUN 12 9  CREATININE 0.61 0.43*  CALCIUM 8.4* 7.7*  AST 65*  --   ALT 32  --   ALKPHOS 28*  --   BILITOT 2.2*  --    ------------------------------------------------------------------------------------------------------------------ No results for input(s): CHOL, HDL, LDLCALC, TRIG, CHOLHDL, LDLDIRECT in the last 72 hours.  Lab Results  Component Value Date   HGBA1C  08/06/2009    5.2 (NOTE) The ADA recommends the following therapeutic goal for glycemic control related to Hgb A1c measurement: Goal of therapy: <6.5 Hgb A1c  Reference: American Diabetes Association: Clinical Practice Recommendations 2010, Diabetes Care, 2010, 33: (Suppl  1).   ------------------------------------------------------------------------------------------------------------------ No results for input(s): TSH, T4TOTAL, T3FREE, THYROIDAB in the last 72 hours.  Invalid input(s): FREET3 ------------------------------------------------------------------------------------------------------------------ No results for input(s): VITAMINB12, FOLATE, FERRITIN, TIBC, IRON, RETICCTPCT in the last 72 hours.  Coagulation profile No results for input(s): INR, PROTIME in the last 168 hours.  No results for input(s): DDIMER in the last 72 hours.  Cardiac Enzymes No results for input(s): CKMB, TROPONINI, MYOGLOBIN in the last 168 hours.  Invalid input(s):  CK ------------------------------------------------------------------------------------------------------------------    Component Value Date/Time   BNP 127.6 (H) 06/10/2018 1128     Roxan Hockey M.D on 07/02/2018 at 4:50 PM  Pager---[phone] Go to [email or web address] - password [email or web address] for contact info  [facility] - Office  [phone]

## 2018-07-02 NOTE — Consult Note (Signed)
Referring Provider:  [place] Primary Care Physician:  [name], MD Primary Gastroenterologist:  Dr. Therisa Doyne  Reason for Consultation:  Pyloric channel stenosis/gastric outlet obstruction  HPI: Angela [name] is a 82 y.o. female with past medical history of atrial fibrillation  On Eliquis,  history of coronary artery disease, history of gastroparesis and history of gastric outlet obstruction from pyloric stenosis who underwent multiple EGD with dilatation of large channel recently with last EGD on 06/17/2018 when dilatation with up to 20 mm balloon was performed currently admitted to the hospital for evaluation of constipation.CT abdomen pelvis without contrast was performed for further evaluation which showed massively distended stomach secondary to severe pyloric stenosis.it also showed thickening of the mucosa at the ano- rectal region.GI is consulted for further evaluation.  Patient seen and examined at bedside. Her constipation is resolved now. She is complaining of ongoing nausea, vomiting and abdominal distention. Complaining of poor oral intake. Denied any blood in the stool. Denied any vomiting of blood.  Past Medical History:  Diagnosis Date  . A-fib ([place])   . Acute renal failure ([place]) 06/15/2013  . Aortic stenosis    mild by echo 03/2012  . Asthma   . Atrial fibrillation ([place])   . Atrial flutter ([place]) 01/16/2016  . CAD (coronary artery disease)    s/p PCI with BMS to mid LAD--2/08 not on aspirin due to frequent ulcers.  Repeat cath for CP 09/2012 with patent LAD stent and otherwise normal coronary arteries  . Cardiomyopathy ([place]) 01/30/2016  . Cervical stenosis of spine   . Chronic pain syndrome 12/02/2017  . Depression   . Duodenitis 01/16/2016  . Dyslipidemia   . Elbow fracture, right    history of- never any surgery  . Essential hypertension, benign 11/03/2013  . Gastric outlet obstruction 01/06/2016  . Gastroparesis 01/06/2016  . GERD (gastroesophageal reflux disease)   . Headache    . Hypertension   . Hyponatremia 06/15/2013  . Impaired physical mobility    due to rheumatoid arthritis "ambulates with walker" limited free standing.  . Intestinal polyp   . Macular degeneration   . Obesity   . Osteoporosis    alendronate since 2012  . PUD (peptic ulcer disease)    can not take aspirin  . PVC's (premature ventricular contractions)   . RA (rheumatoid arthritis) ([place])   . Rheumatoid arthritis(714.0)    Dr. Lenna Gilford follows  . Spondylolisthesis of lumbar region   . SVT (supraventricular tachycardia) ([place]) 01/16/2016    Past Surgical History:  Procedure Laterality Date  . ANKLE FUSION Bilateral    x2 both  . BALLOON DILATION N/A 01/10/2018   Procedure: BALLOON DILATION;  Surgeon: [name], MD;  Location: [facility];  Service: Gastroenterology;  Laterality: N/A;  . BALLOON DILATION N/A 01/24/2018   Procedure: Larrie Kass DILATION;  Surgeon: [name], MD;  Location: [facility];  Service: Gastroenterology;  Laterality: N/A;  W/ Fluoroscopy. Please use colonoscope.  Marland Kitchen BALLOON DILATION N/A 04/15/2018   Procedure: BALLOON DILATION;  Surgeon: [name], MD;  Location: [facility];  Service: Gastroenterology;  Laterality: N/A;  . BALLOON DILATION N/A 06/17/2018   Procedure: BALLOON DILATION;  Surgeon: [name], MD;  Location: [facility];  Service: Gastroenterology;  Laterality: N/A;  . BIOPSY  06/17/2018   Procedure: BIOPSY;  Surgeon: [name], MD;  Location: [facility];  Service: Gastroenterology;;  . CARDIAC CATHETERIZATION  09/2012   patent LAD stent and otherwise normal coronary arteries  . CARDIAC CATHETERIZATION N/A 03/07/2016  Procedure: Left Heart Cath and Coronary Angiography;  Surgeon: Peter M Martinique, MD;  Location: [facility];  Service: Cardiovascular;  Laterality: N/A;  . CARDIOVERSION N/A 03/13/2018   Procedure: CARDIOVERSION;  Surgeon: Lelon Perla, MD;  Location: [facility] ENDOSCOPY;  Service: Cardiovascular;  Laterality: N/A;  . CARDIOVERSION  N/A 05/09/2018   Procedure: CARDIOVERSION;  Surgeon: Pixie Casino, MD;  Location: [facility]COPY;  Service: Cardiovascular;  Laterality: N/A;  . CATARACT EXTRACTION, BILATERAL Bilateral   . CERVICAL FUSION     limited  ROM  . CHOLECYSTECTOMY    . ESOPHAGOGASTRODUODENOSCOPY N/A 01/07/2016   Procedure: ESOPHAGOGASTRODUODENOSCOPY (EGD);  Surgeon: Wilford Corner, MD;  Location: [facility];  Service: Endoscopy;  Laterality: N/A;  . ESOPHAGOGASTRODUODENOSCOPY N/A 01/10/2018   Procedure: ESOPHAGOGASTRODUODENOSCOPY (EGD);  Surgeon: [name], MD;  Location: [facility];  Service: Gastroenterology;  Laterality: N/A;  . ESOPHAGOGASTRODUODENOSCOPY N/A 01/24/2018   Procedure: ESOPHAGOGASTRODUODENOSCOPY (EGD);  Surgeon: [name], MD;  Location: [facility];  Service: Gastroenterology;  Laterality: N/A;  . ESOPHAGOGASTRODUODENOSCOPY N/A 04/15/2018   Procedure: ESOPHAGOGASTRODUODENOSCOPY (EGD);  Surgeon: [name], MD;  Location: [facility];  Service: Gastroenterology;  Laterality: N/A;  . ESOPHAGOGASTRODUODENOSCOPY N/A 06/17/2018   Procedure: ESOPHAGOGASTRODUODENOSCOPY (EGD);  Surgeon: [name], MD;  Location: [facility];  Service: Gastroenterology;  Laterality: N/A;  . ESOPHAGOGASTRODUODENOSCOPY (EGD) WITH PROPOFOL N/A 11/01/2015   Procedure: ESOPHAGOGASTRODUODENOSCOPY (EGD) WITH PROPOFOL w/ Dialtation;  Surgeon: Garlan Fair, MD;  Location: [facility];  Service: Endoscopy;  Laterality: N/A;  . FRACTURE SURGERY  09/2011   left ankle screw and pin removed  . HAND SURGERY Bilateral    x4 digits 2-5 both hands  . JOINT REPLACEMENT Bilateral    BTKA  . TONSILLECTOMY      Prior to Admission medications   Medication Sig Start Date End Date Taking? Authorizing Provider  acebutolol (SECTRAL) 200 MG capsule Take 1 capsule (200 mg total) by mouth daily. 03/10/18  Yes Bhagat, Bhavinkumar, PA  amiodarone (PACERONE) 100 MG tablet Take 1 tablet (100 mg total) by mouth 2 (two) times daily. 06/05/18   Yes Nahser, Wonda Cheng, MD  apixaban (ELIQUIS) 5 MG TABS tablet Take 1 tablet (5 mg total) by mouth 2 (two) times daily. 07/31/17  Yes Daune Perch, NP  butalbital-acetaminophen-caffeine (FIORICET, ESGIC) 50-325-40 MG per tablet Take 1 tablet by mouth every 6 (six) hours as needed for headache or migraine.  06/23/12  Yes [provider]  Calcium Carbonate-Vitamin D (CALCIUM 500+D PO) Take 1 tablet by mouth 2 (two) times daily.    Yes [provider]  diclofenac sodium (VOLTAREN) 1 % GEL Apply 2 grams topically at bedtime to shoulders and elbows 01/29/18  Yes Meredith Staggers, MD  Docusate Calcium (STOOL SOFTENER PO) Take 1 tablet by mouth daily as needed (constipation).    Yes [provider]  escitalopram (LEXAPRO) 5 MG tablet TAKE 1 TABLET BY MOUTH IN THE MORNING Patient taking differently: Take 5 mg by mouth daily. TAKE 1 TABLET BY MOUTH IN THE MORNING 06/16/18  Yes Meredith Staggers, MD  ferrous sulfate 325 (65 FE) MG tablet Take 325 mg by mouth at bedtime.    Yes [provider]  folic acid (FOLVITE) 1 MG tablet Take 1 mg by mouth daily.  12/03/13  Yes [provider]  furosemide (LASIX) 40 MG tablet Take 1 tablet (40 mg total) by mouth daily. 05/23/18 08/21/18 Yes Nahser, Wonda Cheng, MD  lidocaine (LIDODERM) 5 % PLACE 3 PATCHES ONTO THE SKIN  EVERY 12 (TWELVE) HOURS. APPLY NECK AND ELBOW Patient taking differently: Place 2 patches onto the skin daily.  11/01/17  Yes Meredith Staggers, MD  LORazepam (ATIVAN) 2 MG tablet Take 2 mg by mouth at bedtime. 02/05/15  Yes [provider]  methotrexate (RHEUMATREX) 2.5 MG tablet Take 15 mg by mouth every Saturday.  03/01/12  Yes [provider]  nitroGLYCERIN (NITROSTAT) 0.4 MG SL tablet Place 1 tablet (0.4 mg total) under the tongue every 5 (five) minutes as needed for chest pain. 02/21/16  Yes Turner, Eber Hong, MD  nystatin cream (MYCOSTATIN) Apply 1 application topically daily as needed for dry skin.    Yes [provider]  oxyCODONE-acetaminophen (PERCOCET) 7.5-325 MG tablet Take 1 tablet by mouth every 6 (six) hours as needed for moderate pain. 04/30/18  Yes Meredith Staggers, MD  pantoprazole (PROTONIX) 40 MG tablet Take 40 mg by mouth 2 (two) times daily.    Yes [provider]  Potassium Bicarb-Citric Acid (EFFER-K) 10 MEQ TBEF Take 1 tablet (10 mEq total) by mouth daily. 05/23/18  Yes Nahser, Wonda Cheng, MD  predniSONE (DELTASONE) 5 MG tablet Take 5 mg by mouth daily with breakfast.    Yes [provider]  PREVIDENT 0.2 % SOLN Take 1 application by mouth at bedtime.  09/05/12  Yes [provider]  propranolol (INDERAL) 10 MG tablet Take 1 tablet (10 mg total) by mouth as needed (for palpitations). 03/10/18  Yes Bhagat, Bhavinkumar, PA  rosuvastatin (CRESTOR) 10 MG tablet Take 1 tablet (10 mg total) by mouth daily. Patient taking differently: Take 10 mg by mouth at bedtime.  03/11/18 06/22/2018 Yes Bhagat, Bhavinkumar, PA  TOVIAZ 8 MG TB24 tablet Take 8 mg by mouth daily. 10/25/15  Yes [provider]  vitamin B-12 (CYANOCOBALAMIN) 1000 MCG tablet Take 1,000 mcg by mouth daily.   Yes [provider]  vitamin C (ASCORBIC ACID) 500 MG tablet Take 500 mg by mouth daily.    Yes [provider]    Scheduled Meds: . acebutolol  200 mg Oral Daily  . amiodarone  100 mg Oral BID  . apixaban  5 mg Oral BID  . calcium-vitamin D  1 tablet Oral BID  . diclofenac sodium  2 g Topical QID  . docusate sodium  100 mg Oral BID  . escitalopram  5 mg Oral Daily  . ferrous sulfate  325 mg Oral QHS  . fesoterodine  8 mg Oral Daily  . folic acid  1 mg Oral Daily  . furosemide  40 mg Oral Daily  . LORazepam  2 mg Oral QHS  . pantoprazole  40 mg Oral BID  . polyethylene glycol  17 g Oral BID  . predniSONE  5 mg Oral Q breakfast  . rosuvastatin  10 mg Oral QHS  . sodium chloride flush  3 mL Intravenous Q12H  . vitamin B-12  1,000 mcg Oral Daily  .  vitamin C  500 mg Oral Daily   Continuous Infusions: . sodium chloride    . sodium chloride 125 mL/hr at 07/02/18 0522  . cefTRIAXone (ROCEPHIN)  IV 1 g (07/02/18 0639)   PRN Meds:.sodium chloride, acetaminophen **OR** acetaminophen, bisacodyl, nitroGLYCERIN, ondansetron **OR** ondansetron (ZOFRAN) IV, oxyCODONE-acetaminophen, propranolol, sodium chloride flush  Allergies as of 06/16/2018 - Review Complete 07/02/2018  Allergen Reaction Noted  . Peanut-containing drug products Anaphylaxis 01/29/2012  . Reglan [metoclopramide] Other (See Comments) 06/04/2012  . Clindamycin/lincomycin Diarrhea 03/05/2016  . Codeine Nausea Only 03/28/2012  .  Iohexol Swelling and Other (See Comments) 05/11/2005  . Pineapple Swelling 12/08/2014  . Shellfish allergy Swelling and Other (See Comments) 12/04/2011    Family History  Problem Relation Age of Onset  . Lung cancer Mother   . Arrhythmia Mother   . Melanoma Brother   . Heart attack Neg Hx     Social History   Socioeconomic History  . Marital status: Widowed    Spouse name: Not on file  . Number of children: Not on file  . Years of education: Not on file  . Highest education level: Not on file  Occupational History  . Not on file  Social Needs  . Financial resource strain: Not on file  . Food insecurity:    Worry: Not on file    Inability: Not on file  . Transportation needs:    Medical: Not on file    Non-medical: Not on file  Tobacco Use  . Smoking status: Never Smoker  . Smokeless tobacco: Never Used  Substance and Sexual Activity  . Alcohol use: No  . Drug use: No  . Sexual activity: Not on file  Lifestyle  . Physical activity:    Days per week: Not on file    Minutes per session: Not on file  . Stress: Not on file  Relationships  . Social connections:    Talks on phone: Not on file    Gets together: Not on file    Attends religious service: Not on file    Active member of club or organization: Not on file    Attends  meetings of clubs or organizations: Not on file    Relationship status: Not on file  . Intimate partner violence:    Fear of current or ex partner: Not on file    Emotionally abused: Not on file    Physically abused: Not on file    Forced sexual activity: Not on file  Other Topics Concern  . Not on file  Social History Narrative  . Not on file    Review of Systems: Review of Systems  Constitutional: Positive for weight loss. Negative for chills and fever.  HENT: Negative for hearing loss and tinnitus.   Eyes: Negative for blurred vision and double vision.  Respiratory: Positive for cough. Negative for hemoptysis and sputum production.   Cardiovascular: Negative for chest pain and palpitations.  Gastrointestinal: Positive for abdominal pain, constipation, heartburn, nausea and vomiting.  Genitourinary: Negative for dysuria and urgency.  Musculoskeletal: Positive for back pain and joint pain.  Skin: Negative for itching and rash.  Neurological: Negative for seizures and loss of consciousness.  Endo/Heme/Allergies: Does not bruise/bleed easily.  Psychiatric/Behavioral: Negative for hallucinations and suicidal ideas.    Physical Exam: Vital signs: Vitals:   07/01/18 2223 07/02/18 0509  BP: 107/61 (!) 106/59  Pulse: 62   Resp: 16 16  Temp: 98.2 F (36.8 C) 97.8 F (36.6 C)  SpO2: 98% 95%   Last BM Date: 07/01/18 Physical Exam  Constitutional: She is oriented to person, place, and time. No distress.  Elderly-appearing patient.  HENT:  Head: Normocephalic and atraumatic.  Mouth/Throat: Oropharynx is clear and moist. No oropharyngeal exudate.  Eyes: EOM are normal. No scleral icterus.  Neck: Normal range of motion. Neck supple.  Cardiovascular: Normal rate.  Murmur heard. Pulmonary/Chest: Effort normal and breath sounds normal. No respiratory distress.  Abdominal: Bowel sounds are normal. She exhibits distension. There is no rebound and no guarding.  Abdomen significantly  distended and  tense.  Musculoskeletal: Normal range of motion. She exhibits edema.  Neurological: She is alert and oriented to person, place, and time.  Skin: Skin is dry. No erythema.  Psychiatric: She has a normal mood and affect. Judgment normal.   GI:  Lab Results: No results for input(s): WBC, HGB, HCT, PLT in the last 72 hours. BMET Recent Labs    06/30/18 0500  NA 130*  K 3.9  CL 100  CO2 23  GLUCOSE 104*  BUN 9  CREATININE 0.43*  CALCIUM 7.7*   LFT No results for input(s): PROT, ALBUMIN, AST, ALT, ALKPHOS, BILITOT, BILIDIR, IBILI in the last 72 hours. PT/INR No results for input(s): LABPROT, INR in the last 72 hours.   Studies/Results: Ct Abdomen Pelvis Wo Contrast  Result Date: 07/01/2018 CLINICAL DATA:  Abdominal distention, pain, nausea, history of severe pyloric stenosis EXAM: CT ABDOMEN AND PELVIS WITHOUT CONTRAST TECHNIQUE: Multidetector CT imaging of the abdomen and pelvis was performed following the standard protocol without IV contrast. COMPARISON:  Chest and abdomen films of 06/09/2017 and CT abdomen pelvis of 01/10/2016 FINDINGS: Lower chest: There are moderate-sized bilateral pleural effusions present with bibasilar linear atelectasis. Moderate cardiomegaly is again noted. No pericardial effusion is seen. Hepatobiliary: The liver is unremarkable in this unenhanced study. Surgical clips are present from prior cholecystectomy. Pancreas: Pancreas appears atrophic. The pancreatic duct is not dilated. Spleen: The spleen is unremarkable with calcified splenic artery noted. Adrenals/Urinary Tract: Adrenal glands appear normal. No renal calculi are seen and there is no evidence of hydronephrosis. The ureters do not appear to be dilated there are difficult to follow in this patient. The urinary bladder is distended with no abnormality evident. Stomach/Bowel: The stomach is massively distended with fluid and food debris as well as oral contrast within this markedly distended  stomach. In this patient with history of severe pyloric stenosis, recurrence of severe pyloric stenosis is the primary consideration. No definite mass is seen. No small bowel distention is seen. The mucosa of the ano-rectal region is somewhat thickened of questionable significance. Clinical correlation is recommended. The majority of the colon is decompressed and very difficult to assess. The terminal ileum is poorly visualized but the appendix does appear to fill with air and is unremarkable. Vascular/Lymphatic: Moderate abdominal aortic atherosclerosis is present. No aneurysm is noted. There is no evidence of adenopathy. Reproductive: The uterus is somewhat atrophic. No adnexal lesion is seen. No fluid is noted within the pelvis. Other: No abdominal wall hernia is seen. Musculoskeletal: There is anterolisthesis of L4 on L5 by 5 mm and anterolisthesis of L3 on L4 by 8 mm. Both of these malalignments appear to be due to degenerative change of the facet joints. There is considerable degenerative disc disease at both L3-4 and L4-5 levels. No compression deformity is seen. There is mild degenerative joint disease of the hips present. IMPRESSION: 1. Massively distended stomach with food and fluid present. This most likely is due to recurrence of severe pyloric stenosis by history. 2. Circumferential thickening of the a no rectal mucosa of questionable significance. Correlate clinically. 3. Degenerative disc disease at L3-4 and L4-5 as noted above. 4. Moderate-sized bilateral pleural effusions with bibasilar atelectasis. Cardiomegaly. Electronically Signed   By: Ivar Drape M.D.   On: 07/01/2018 13:28    Impression/Plan: - gastric outlet obstruction from chronic severe pyloric channel stenosis and probably abnormality of post pyloric region/first portion of the duodenum from previous ulcer disease.biopsies negative in the past. Most likely benign stricture. Multiple balloon  dilatation recently without any improvement  in symptoms. I think underlying gastroparesis is also contributing to her symptoms. Recent development of constipation also suggest that she has a underlying motility disorder. - poor oral intake - Abnormal CT scan showing circumferential thickening of endorectal mucosa of questionable significance. Probably from recent multiple enemas.  - Afib - On Eliquis  - H/O CAD   Recommendations --------------------------- - Discuss with Dr. Therisa Doyne. . She definitely needs feeding J tube as she is not able to tolerate any oral intake.she recommended repeating upper GI series to evaluate anatomy of pyloric channel as well as of the duodenum. Repeat dilatation will not probably improve her symptoms.her abdomen is significantly distended today, recommend NG tube for decompression first and probably upper GI series tomorrow. - GI will follow.     LOS: 2 days   Otis Brace  MD, FACP 07/02/2018, 3:00 PM  Contact #  [phone]

## 2018-07-02 NOTE — Progress Notes (Signed)
RN paged provider to advise on oral medications and possible alternative pain medication as patient has NG tube currently and is NPO, awaiting return call.  P.J. Linus Mako, RN

## 2018-07-02 NOTE — Consult Note (Signed)
[facility] Surgery Consult Note  DEMMI SINDT 1936-08-09  [id number].    Requesting MD: Denton Brick Chief Complaint/Reason for Consult: gastric outlet obstruction  HPI:  Patient is a 82 year old female who presented to [facility] 8/25 for R arm tingling and numbness and nausea. She reported R arm has been tingling on and off for the last 5 years, multiple past surgeries on neck/spine related to arthritis. She has a PMH significant for severe pyloric stenosis with multiple EGD in the past 6 months with attempts to dilate pyloric sphincter. GI reportedly recently recommended J-tube placement due to her malnutrition. She has seen Dr. Therisa Doyne at [place]. She has had poor stool output recently. She was admitted to the hospitalist service and initially treated for severe constipation with aggressive bowel regimen, when she failed to improve with this CT abd/pelvis ordered 8/27. CT showed massively distended stomach with food and fluid present and circumferential thickening of the anorectal mucosa of questionable significance. Today patient is distended and complains of nausea and generalized abdominal pain. Patient reports 25 lbs weight loss over the last several months due to poor PO intake. Also reports feeling like she has food backing up into esophagus causing chest pain.   States someone told her that she was having surgery today to place a gastric tube. Patient frustrated when I mentioned that she would not be able to have surgery today and would need decompression with nasogastric tube. Patient lives at home alone but has a caretaker that comes in 4 days a week for several hrs, she states that she struggles greatly the other 3 days per week.   PMH significant for CAD s/p stent in 2008, SVT, HTN, paroxysmal atrial fibrillation, rheumatoid arthritis, chronic back pain, PUD with gastroparesis and pyloric stenosis. Pt is on Eliquis, last dose 0940 this AM. No past abdominal surgeries. Allergic to peanuts,  reglan, clindamycin, codeine, iohexol, and shellfish.   ROS: Review of Systems  Constitutional: Positive for weight loss (25 lbs over last 6 months). Negative for chills and fever.  Respiratory: Negative for shortness of breath and wheezing.   Cardiovascular: Positive for chest pain and leg swelling. Negative for palpitations.  Gastrointestinal: Positive for abdominal pain, constipation and nausea. Negative for diarrhea.  Genitourinary: Negative for dysuria, frequency and urgency.  Musculoskeletal: Positive for back pain (chronic ).  All other systems reviewed and are negative.   Family History  Problem Relation Age of Onset  . Lung cancer Mother   . Arrhythmia Mother   . Melanoma Brother   . Heart attack Neg Hx     Past Medical History:  Diagnosis Date  . A-fib ([place])   . Acute renal failure ([place]) 06/15/2013  . Aortic stenosis    mild by echo 03/2012  . Asthma   . Atrial fibrillation ([place])   . Atrial flutter ([place]) 01/16/2016  . CAD (coronary artery disease)    s/p PCI with BMS to mid LAD--2/08 not on aspirin due to frequent ulcers.  Repeat cath for CP 09/2012 with patent LAD stent and otherwise normal coronary arteries  . Cardiomyopathy ([place]) 01/30/2016  . Cervical stenosis of spine   . Chronic pain syndrome 12/02/2017  . Depression   . Duodenitis 01/16/2016  . Dyslipidemia   . Elbow fracture, right    history of- never any surgery  . Essential hypertension, benign 11/03/2013  . Gastric outlet obstruction 01/06/2016  . Gastroparesis 01/06/2016  . GERD (gastroesophageal reflux disease)   . Headache   .  Hypertension   . Hyponatremia 06/15/2013  . Impaired physical mobility    due to rheumatoid arthritis "ambulates with walker" limited free standing.  . Intestinal polyp   . Macular degeneration   . Obesity   . Osteoporosis    alendronate since 2012  . PUD (peptic ulcer disease)    can not take aspirin  . PVC's (premature ventricular contractions)   . RA (rheumatoid  arthritis) ([place])   . Rheumatoid arthritis(714.0)    Dr. Lenna Gilford follows  . Spondylolisthesis of lumbar region   . SVT (supraventricular tachycardia) ([place]) 01/16/2016    Past Surgical History:  Procedure Laterality Date  . ANKLE FUSION Bilateral    x2 both  . BALLOON DILATION N/A 01/10/2018   Procedure: BALLOON DILATION;  Surgeon: [name], MD;  Location: [place];  Service: Gastroenterology;  Laterality: N/A;  . BALLOON DILATION N/A 01/24/2018   Procedure: Larrie Kass DILATION;  Surgeon: [name], MD;  Location: [place];  Service: Gastroenterology;  Laterality: N/A;  W/ Fluoroscopy. Please use colonoscope.  Marland Kitchen BALLOON DILATION N/A 04/15/2018   Procedure: BALLOON DILATION;  Surgeon: [name], MD;  Location: [facility];  Service: Gastroenterology;  Laterality: N/A;  . BALLOON DILATION N/A 06/17/2018   Procedure: BALLOON DILATION;  Surgeon: [name], MD;  Location: [facility];  Service: Gastroenterology;  Laterality: N/A;  . BIOPSY  06/17/2018   Procedure: BIOPSY;  Surgeon: [name], MD;  Location: [facility];  Service: Gastroenterology;;  . CARDIAC CATHETERIZATION  09/2012   patent LAD stent and otherwise normal coronary arteries  . CARDIAC CATHETERIZATION N/A 03/07/2016   Procedure: Left Heart Cath and Coronary Angiography;  Surgeon: Peter M Martinique, MD;  Location: [place] [facility];  Service: Cardiovascular;  Laterality: N/A;  . CARDIOVERSION N/A 03/13/2018   Procedure: CARDIOVERSION;  Surgeon: Lelon Perla, MD;  Location: [facility];  Service: Cardiovascular;  Laterality: N/A;  . CARDIOVERSION N/A 05/09/2018   Procedure: CARDIOVERSION;  Surgeon: Pixie Casino, MD;  Location: [facility] ENDOSCOPY;  Service: Cardiovascular;  Laterality: N/A;  . CATARACT EXTRACTION, BILATERAL Bilateral   . CERVICAL FUSION     limited  ROM  . CHOLECYSTECTOMY    . ESOPHAGOGASTRODUODENOSCOPY N/A 01/07/2016   Procedure: ESOPHAGOGASTRODUODENOSCOPY (EGD);  Surgeon: Wilford Corner, MD;  Location:  [facility];  Service: Endoscopy;  Laterality: N/A;  . ESOPHAGOGASTRODUODENOSCOPY N/A 01/10/2018   Procedure: ESOPHAGOGASTRODUODENOSCOPY (EGD);  Surgeon: [name], MD;  Location: [place];  Service: Gastroenterology;  Laterality: N/A;  . ESOPHAGOGASTRODUODENOSCOPY N/A 01/24/2018   Procedure: ESOPHAGOGASTRODUODENOSCOPY (EGD);  Surgeon: [name], MD;  Location: [place];  Service: Gastroenterology;  Laterality: N/A;  . ESOPHAGOGASTRODUODENOSCOPY N/A 04/15/2018   Procedure: ESOPHAGOGASTRODUODENOSCOPY (EGD);  Surgeon: [name], MD;  Location: [facility];  Service: Gastroenterology;  Laterality: N/A;  . ESOPHAGOGASTRODUODENOSCOPY N/A 06/17/2018   Procedure: ESOPHAGOGASTRODUODENOSCOPY (EGD);  Surgeon: [name], MD;  Location: [facility];  Service: Gastroenterology;  Laterality: N/A;  . ESOPHAGOGASTRODUODENOSCOPY (EGD) WITH PROPOFOL N/A 11/01/2015   Procedure: ESOPHAGOGASTRODUODENOSCOPY (EGD) WITH PROPOFOL w/ Dialtation;  Surgeon: Garlan Fair, MD;  Location: [facility];  Service: Endoscopy;  Laterality: N/A;  . FRACTURE SURGERY  09/2011   left ankle screw and pin removed  . HAND SURGERY Bilateral    x4 digits 2-5 both hands  . JOINT REPLACEMENT Bilateral    BTKA  . TONSILLECTOMY      Social History:  reports that she has never smoked. She has never used smokeless tobacco. She reports that she does not drink alcohol or use  drugs.  Allergies:  Allergies  Allergen Reactions  . Peanut-Containing Drug Products Anaphylaxis  . Reglan [Metoclopramide] Other (See Comments)    "Messed my mind up"  . Clindamycin/Lincomycin Diarrhea  . Codeine Nausea Only  . Iohexol Swelling and Other (See Comments)     Desc: pt received hypaque 60% in 1994 and had erythema, hives & lips swell.  She did ok w/o premeds in 8/05 at [facility].  Pt. takes daily prednisone for rheumatoid arthritis.  Poor venous access today (7/7/6) so we did her w/o iv contrast.   . Pineapple Swelling  . Shellfish  Allergy Swelling and Other (See Comments)    Lips swell    Medications Prior to Admission  Medication Sig Dispense Refill  . acebutolol (SECTRAL) 200 MG capsule Take 1 capsule (200 mg total) by mouth daily. 90 capsule 3  . amiodarone (PACERONE) 100 MG tablet Take 1 tablet (100 mg total) by mouth 2 (two) times daily. 180 tablet 3  . apixaban (ELIQUIS) 5 MG TABS tablet Take 1 tablet (5 mg total) by mouth 2 (two) times daily. 180 tablet 3  . butalbital-acetaminophen-caffeine (FIORICET, ESGIC) 50-325-40 MG per tablet Take 1 tablet by mouth every 6 (six) hours as needed for headache or migraine.     . Calcium Carbonate-Vitamin D (CALCIUM 500+D PO) Take 1 tablet by mouth 2 (two) times daily.     . diclofenac sodium (VOLTAREN) 1 % GEL Apply 2 grams topically at bedtime to shoulders and elbows 300 g 4  . Docusate Calcium (STOOL SOFTENER PO) Take 1 tablet by mouth daily as needed (constipation).     Marland Kitchen escitalopram (LEXAPRO) 5 MG tablet TAKE 1 TABLET BY MOUTH IN THE MORNING (Patient taking differently: Take 5 mg by mouth daily. TAKE 1 TABLET BY MOUTH IN THE MORNING) 90 tablet 1  . ferrous sulfate 325 (65 FE) MG tablet Take 325 mg by mouth at bedtime.     . folic acid (FOLVITE) 1 MG tablet Take 1 mg by mouth daily.     . furosemide (LASIX) 40 MG tablet Take 1 tablet (40 mg total) by mouth daily. 90 tablet 3  . lidocaine (LIDODERM) 5 % PLACE 3 PATCHES ONTO THE SKIN EVERY 12 (TWELVE) HOURS. APPLY NECK AND ELBOW (Patient taking differently: Place 2 patches onto the skin daily. ) 90 patch 5  . LORazepam (ATIVAN) 2 MG tablet Take 2 mg by mouth at bedtime.  5  . methotrexate (RHEUMATREX) 2.5 MG tablet Take 15 mg by mouth every Saturday.     . nitroGLYCERIN (NITROSTAT) 0.4 MG SL tablet Place 1 tablet (0.4 mg total) under the tongue every 5 (five) minutes as needed for chest pain. 25 tablet 1  . nystatin cream (MYCOSTATIN) Apply 1 application topically daily as needed for dry skin.    Marland Kitchen oxyCODONE-acetaminophen  (PERCOCET) 7.5-325 MG tablet Take 1 tablet by mouth every 6 (six) hours as needed for moderate pain. 90 tablet 0  . pantoprazole (PROTONIX) 40 MG tablet Take 40 mg by mouth 2 (two) times daily.     . Potassium Bicarb-Citric Acid (EFFER-K) 10 MEQ TBEF Take 1 tablet (10 mEq total) by mouth daily. 120 each 3  . predniSONE (DELTASONE) 5 MG tablet Take 5 mg by mouth daily with breakfast.     . PREVIDENT 0.2 % SOLN Take 1 application by mouth at bedtime.     . propranolol (INDERAL) 10 MG tablet Take 1 tablet (10 mg total) by mouth as needed (for palpitations). 30 tablet  6  . rosuvastatin (CRESTOR) 10 MG tablet Take 1 tablet (10 mg total) by mouth daily. (Patient taking differently: Take 10 mg by mouth at bedtime. ) 90 tablet 3  . TOVIAZ 8 MG TB24 tablet Take 8 mg by mouth daily.    . vitamin B-12 (CYANOCOBALAMIN) 1000 MCG tablet Take 1,000 mcg by mouth daily.    . vitamin C (ASCORBIC ACID) 500 MG tablet Take 500 mg by mouth daily.       Blood pressure (!) 106/59, pulse 62, temperature 97.8 F (36.6 C), temperature source Oral, resp. rate 16, height 5\' 1"  (1.549 m), weight 48.5 kg, SpO2 95 %. Physical Exam: Physical Exam  Constitutional: She is oriented to person, place, and time. She appears well-developed. She appears cachectic. She is cooperative.  Non-toxic appearance. No distress.  HENT:  Head: Normocephalic and atraumatic.  Right Ear: External ear normal.  Left Ear: External ear normal.  Nose: Nose normal.  Mouth/Throat: Mucous membranes are normal.  Eyes: Conjunctivae, EOM and lids are normal. No scleral icterus.  Pupils equal and round   Neck: Phonation normal. Neck supple.  Cardiovascular: Normal rate and regular rhythm.  Pulses:      Radial pulses are 2+ on the right side, and 2+ on the left side.       Dorsalis pedis pulses are 2+ on the right side, and 2+ on the left side.  Trace edema in bilateral LEs  Pulmonary/Chest: Effort normal and breath sounds normal.  Abdominal: Bowel  sounds are normal. She exhibits distension. There is generalized tenderness. There is no rigidity, no rebound and no guarding. A hernia (small umbilical, without incarceration ) is present.  Musculoskeletal:  Patient with multiple arthritic appearing joints  Neurological: She is alert and oriented to person, place, and time.  Some repetitive questioning   Skin: Skin is warm, dry and intact. There is pallor.  Psychiatric: She has a normal mood and affect. Her speech is normal and behavior is normal.  Pt seems slightly overwhelmed and frequently has to be reminded of points already discussed    No results found for this or any previous visit (from the past 48 hour(s)). Ct Abdomen Pelvis Wo Contrast  Result Date: 07/01/2018 CLINICAL DATA:  Abdominal distention, pain, nausea, history of severe pyloric stenosis EXAM: CT ABDOMEN AND PELVIS WITHOUT CONTRAST TECHNIQUE: Multidetector CT imaging of the abdomen and pelvis was performed following the standard protocol without IV contrast. COMPARISON:  Chest and abdomen films of 06/09/2017 and CT abdomen pelvis of 01/10/2016 FINDINGS: Lower chest: There are moderate-sized bilateral pleural effusions present with bibasilar linear atelectasis. Moderate cardiomegaly is again noted. No pericardial effusion is seen. Hepatobiliary: The liver is unremarkable in this unenhanced study. Surgical clips are present from prior cholecystectomy. Pancreas: Pancreas appears atrophic. The pancreatic duct is not dilated. Spleen: The spleen is unremarkable with calcified splenic artery noted. Adrenals/Urinary Tract: Adrenal glands appear normal. No renal calculi are seen and there is no evidence of hydronephrosis. The ureters do not appear to be dilated there are difficult to follow in this patient. The urinary bladder is distended with no abnormality evident. Stomach/Bowel: The stomach is massively distended with fluid and food debris as well as oral contrast within this markedly  distended stomach. In this patient with history of severe pyloric stenosis, recurrence of severe pyloric stenosis is the primary consideration. No definite mass is seen. No small bowel distention is seen. The mucosa of the ano-rectal region is somewhat thickened of questionable significance. Clinical correlation  is recommended. The majority of the colon is decompressed and very difficult to assess. The terminal ileum is poorly visualized but the appendix does appear to fill with air and is unremarkable. Vascular/Lymphatic: Moderate abdominal aortic atherosclerosis is present. No aneurysm is noted. There is no evidence of adenopathy. Reproductive: The uterus is somewhat atrophic. No adnexal lesion is seen. No fluid is noted within the pelvis. Other: No abdominal wall hernia is seen. Musculoskeletal: There is anterolisthesis of L4 on L5 by 5 mm and anterolisthesis of L3 on L4 by 8 mm. Both of these malalignments appear to be due to degenerative change of the facet joints. There is considerable degenerative disc disease at both L3-4 and L4-5 levels. No compression deformity is seen. There is mild degenerative joint disease of the hips present. IMPRESSION: 1. Massively distended stomach with food and fluid present. This most likely is due to recurrence of severe pyloric stenosis by history. 2. Circumferential thickening of the a no rectal mucosa of questionable significance. Correlate clinically. 3. Degenerative disc disease at L3-4 and L4-5 as noted above. 4. Moderate-sized bilateral pleural effusions with bibasilar atelectasis. Cardiomegaly. Electronically Signed   By: Ivar Drape M.D.   On: 07/01/2018 13:28      Assessment/Plan CAD s/p stent in 2008, not on ASA due to frequent ulcers SVT HTN Atrial fibrillation PUD  Gastroparesis and GOO Rheumatoid arthritis/Spondylolisthesis/Chronic pain syndrome Osteoporosis UTI - on IV rocephin  Constipation   Gastric outlet obstruction secondary to severe pyloric  stenosis - EGD with bx 8/13 did not show malignancy, need GI to confirm but it seems that pyloric stenosis most likely due to PUD - past balloon dilations 01/10/18, 01/24/18, 04/15/18, 06/17/18 - all by Dr. Therisa Doyne - needs NGT decompression of stomach for now - may require PICC and TPN - GI to see and weigh in as well - no indications for emergent surgical intervention at this time  - would recommend holding eliquis and could put on heparin in case patient requires GI or surgical procedures - if patient requires surgery, it would most likely mean gastrojejunostomy - If GI able to decompress pt and dilate her again, this could be done electively as an outpatient - get cardiac clearance in case patient requires urgent surgery or elective outpatient surgery    FEN: NPO, IVF VTE: SCDs ID: [id number] 8/26>>  Brigid Re, [facility] Surgery 07/02/2018, 12:50 PM Pager: [phone] Consults: [phone] Mon-Fri 7:00 am-4:30 pm Sat-Sun 7:00 am-11:30 am

## 2018-07-03 ENCOUNTER — Inpatient Hospital Stay (HOSPITAL_COMMUNITY): Payer: Medicare Other

## 2018-07-03 DIAGNOSIS — Z01810 Encounter for preprocedural cardiovascular examination: Secondary | ICD-10-CM

## 2018-07-03 MED ORDER — FAMOTIDINE IN NACL 20-0.9 MG/50ML-% IV SOLN
20.0000 mg | Freq: Every day | INTRAVENOUS | Status: DC
  Administered 2018-07-03 – 2018-07-06 (×4): 20 mg via INTRAVENOUS
  Filled 2018-07-03 (×5): qty 50

## 2018-07-03 MED ORDER — PHENOL 1.4 % MT LIQD
1.0000 | OROMUCOSAL | Status: DC | PRN
  Administered 2018-07-03 (×4): 1 via OROMUCOSAL
  Filled 2018-07-03: qty 177

## 2018-07-03 MED ORDER — LORAZEPAM 2 MG/ML IJ SOLN
1.0000 mg | Freq: Once | INTRAMUSCULAR | Status: AC
  Administered 2018-07-03: 1 mg via INTRAVENOUS
  Filled 2018-07-03: qty 1

## 2018-07-03 NOTE — Progress Notes (Signed)
Patient Demographics:    Angela [name], is a 82 y.o. female, DOB - July 14, 1936, SAY:[id number]  Admit date - 06/27/2018   Admitting Physician Angela Deutscher, MD  Outpatient Primary MD for the patient is Angela Orn, MD  LOS - 3   Chief Complaint  Patient presents with  . Failure To Thrive  . Nausea        Subjective:    Angela [name] today has no fevers, abdominal discomfort is better after NG tube placement, passing gas but no BM  Assessment  & Plan :    Principal Problem:   Constipation Active Problems:   Cervical spondylosis with myelopathy   Rheumatoid arthritis (HCC)   Hyponatremia   Dyslipidemia   Essential hypertension, benign   Aortic stenosis   CAD (coronary artery disease)   Gastric outlet obstruction   PAF (paroxysmal atrial fibrillation) (HCC)   Cardiomyopathy (HCC)  Brief Narrative:  82 year old with past medical history of severe pyloric stenosis, CAD, atrial fibrillation, hyperlipidemia, osteoporosis, peptic ulcer disease, rheumatoid arthritis came to the hospital with concerns of feeling very nauseous and abdominal discomfort.  In the ER abdominal x-ray showed large stool burden without any other acute pathology.  Sodium was noted to be 128 and potassium 5.9.  She was started on bowel regimen and admitted to the hospital.  With IV fluids her sodium improved to 130 and potassium trended down to 3.9.  UA was suggestive of possible urinary tract infection therefore started on IV Rocephin.  After starting the patient on aggressive bowel regimen she had few bowel movements but her abdomen appears slightly more distended.  CT of the abdomen pelvis showed severe gastric distention due to severe pyloric obstruction   Plan:- 1)Severe Pyloric Stenosis--- resulting in severe gastric distention and recurrent nausea vomiting, poor oral intake, weight loss and protein caloric  malnutrition--- failed multiple attempts at EGD with dilatation, last dilatation was 06/17/2018, GI and surgical consult appreciated , patient will most likely need gastrojejunal bypass most likely with gastrojejunostomy, with possible gastrostomy and jejunostomy tubes, continue NG tube for decompression,  upper GI series on 07/03/2018 shows focal smooth narrowing of the pylorus with stranding measuring 4 mm-by suction the with NG tube in the stomach remains distended by fluid/debris's,  hold Eliquis to allow for procedures .  Preop clearance from cardiology appreciated, they recommend no further cardiovascular testing prior to surgery.   2) GI motility disorder with constipation--- constipation has resolved with laxatives,   3)PAFib--- s/p DCCV 05/09/2018, remains in sinus rhythm, stable, hold amiodarone 100 mg twice daily as patient is n.p.o., hold Eliquis to allow for procedures, cardiology input/consult appreciated  4)H/o CAD--- Bare metal stent apparently around 2003, no ACS type symptoms at this time, okay to resume acebutolol 200 mg daily and crestor 10 mg when oral intake resumes, cardiology consult appreciated  5)Rheumatoid Arthritis---  prednisone 5 mg daily on hold as patient is n.p.o, consider stress dose steroids perioperatively  Code Status : full code  Disposition Plan  : TBD  Consults  :  Gi/Gen surg/cardiology for preop clearance  DVT Prophylaxis  :    SCDs (hold Eliquis to allow for procedures)  Lab Results  Component Value Date   PLT 150 06/28/2018  Inpatient Medications  Scheduled Meds: . acebutolol  200 mg Oral Daily  . amiodarone  100 mg Oral BID  . calcium-vitamin D  1 tablet Oral BID  . diclofenac sodium  2 g Topical QID  . docusate sodium  100 mg Oral BID  . escitalopram  5 mg Oral Daily  . ferrous sulfate  325 mg Oral QHS  . fesoterodine  8 mg Oral Daily  . folic acid  1 mg Oral Daily  . LORazepam  2 mg Oral QHS  . polyethylene glycol  17 g Oral BID  .  predniSONE  5 mg Oral Q breakfast  . rosuvastatin  10 mg Oral QHS  . sodium chloride flush  3 mL Intravenous Q12H  . vitamin B-12  1,000 mcg Oral Daily  . vitamin C  500 mg Oral Daily   Continuous Infusions: . sodium chloride    . dextrose 5 % and 0.9% NaCl 50 mL/hr at 07/02/18 1812  . famotidine (PEPCID) IV 20 mg (07/03/18 1345)   PRN Meds:.sodium chloride, acetaminophen **OR** acetaminophen, bisacodyl, morphine injection, nitroGLYCERIN, ondansetron **OR** ondansetron (ZOFRAN) IV, oxyCODONE-acetaminophen, phenol, propranolol, sodium chloride flush    Anti-infectives (From admission, onward)   Start     Dose/Rate Route Frequency Ordered Stop   06/30/18 0645  cefTRIAXone (ROCEPHIN) 1 g in sodium chloride 0.9 % 100 mL IVPB  Status:  Discontinued     1 g 200 mL/hr over 30 Minutes Intravenous Every 24 hours 06/30/18 0643 07/02/18 1655   06/30/2018 1500  cefTRIAXone (ROCEPHIN) 1 g in sodium chloride 0.9 % 100 mL IVPB  Status:  Discontinued     1 g 200 mL/hr over 30 Minutes Intravenous Every 24 hours 06/08/2018 1451 06/06/2018 1528        Objective:   Vitals:   07/02/18 1605 07/02/18 2210 07/03/18 0459 07/03/18 1246  BP: 118/60 (!) 123/59 (!) 100/53 (!) 112/57  Pulse: 65 64 (!) 56 (!) 58  Resp: 20 18 18 18   Temp:  98.1 F (36.7 C) 97.8 F (36.6 C)   TempSrc:      SpO2: 99% 96% 96% 98%  Weight:      Height:        Wt Readings from Last 3 Encounters:  06/16/2018 48.5 kg  06/17/18 48.5 kg  06/03/18 48.9 kg     Intake/Output Summary (Last 24 hours) at 07/03/2018 1812 Last data filed at 07/03/2018 1600 Gross per 24 hour  Intake -  Output 1250 ml  Net -1250 ml     Physical Exam  Gen:- Awake Alert,  In no apparent distress  HEENT:- [place].AT, No sclera icterus Nose- NG tube to intermittent wall suction Neck-Supple Neck,No JVD,.  Lungs-  CTAB , good air movement CV- S1, S2 normal Abd-  +ve B.Sounds,  much less distended,    Extremity/Skin:-Good pulses Psych-affect is  appropriate, oriented x3 Neuro-no new focal deficits, no tremors   Data Review:   Micro Results Recent Results (from the past 240 hour(s))  Urine Culture     Status: Abnormal   Collection Time: 06/06/2018  1:47 PM  Result Value Ref Range Status   Specimen Description URINE, RANDOM  Final   Special Requests   Final    NONE Performed at [facility], [place] [address] [place], [place] [address]    Culture MULTIPLE SPECIES PRESENT, SUGGEST RECOLLECTION (A)  Final   Report Status 06/30/2018 FINAL  Final    Radiology Reports Ct Abdomen Pelvis Wo Contrast  Result Date: 07/01/2018 CLINICAL DATA:  Abdominal distention, pain, nausea, history of severe pyloric stenosis EXAM: CT ABDOMEN AND PELVIS WITHOUT CONTRAST TECHNIQUE: Multidetector CT imaging of the abdomen and pelvis was performed following the standard protocol without IV contrast. COMPARISON:  Chest and abdomen films of 06/09/2017 and CT abdomen pelvis of 01/10/2016 FINDINGS: Lower chest: There are moderate-sized bilateral pleural effusions present with bibasilar linear atelectasis. Moderate cardiomegaly is again noted. No pericardial effusion is seen. Hepatobiliary: The liver is unremarkable in this unenhanced study. Surgical clips are present from prior cholecystectomy. Pancreas: Pancreas appears atrophic. The pancreatic duct is not dilated. Spleen: The spleen is unremarkable with calcified splenic artery noted. Adrenals/Urinary Tract: Adrenal glands appear normal. No renal calculi are seen and there is no evidence of hydronephrosis. The ureters do not appear to be dilated there are difficult to follow in this patient. The urinary bladder is distended with no abnormality evident. Stomach/Bowel: The stomach is massively distended with fluid and food debris as well as oral contrast within this markedly distended stomach. In this patient with history of severe pyloric stenosis, recurrence of severe pyloric stenosis is the primary consideration.  No definite mass is seen. No small bowel distention is seen. The mucosa of the ano-rectal region is somewhat thickened of questionable significance. Clinical correlation is recommended. The majority of the colon is decompressed and very difficult to assess. The terminal ileum is poorly visualized but the appendix does appear to fill with air and is unremarkable. Vascular/Lymphatic: Moderate abdominal aortic atherosclerosis is present. No aneurysm is noted. There is no evidence of adenopathy. Reproductive: The uterus is somewhat atrophic. No adnexal lesion is seen. No fluid is noted within the pelvis. Other: No abdominal wall hernia is seen. Musculoskeletal: There is anterolisthesis of L4 on L5 by 5 mm and anterolisthesis of L3 on L4 by 8 mm. Both of these malalignments appear to be due to degenerative change of the facet joints. There is considerable degenerative disc disease at both L3-4 and L4-5 levels. No compression deformity is seen. There is mild degenerative joint disease of the hips present. IMPRESSION: 1. Massively distended stomach with food and fluid present. This most likely is due to recurrence of severe pyloric stenosis by history. 2. Circumferential thickening of the a no rectal mucosa of questionable significance. Correlate clinically. 3. Degenerative disc disease at L3-4 and L4-5 as noted above. 4. Moderate-sized bilateral pleural effusions with bibasilar atelectasis. Cardiomegaly. Electronically Signed   By: Ivar Drape M.D.   On: 07/01/2018 13:28   Dg Abd Acute W/chest  Result Date: 06/06/2018 CLINICAL DATA:  Nausea. EXAM: DG ABDOMEN ACUTE W/ 1V CHEST COMPARISON:  Radiographs of May 14, 2018. FINDINGS: Large amount of stool seen throughout the colon. No abnormal bowel dilatation is noted. No radiopaque calculi or other significant radiographic abnormality is seen. Stable cardiomegaly. Status post cholecystectomy. Atherosclerosis of thoracic aorta is noted. Both lungs are clear. IMPRESSION:  Large stool burden is noted. No abnormal bowel dilatation is noted. No acute cardiopulmonary disease. Aortic Atherosclerosis (ICD10-I70.0). Electronically Signed   By: Marijo Conception, M.D.   On: 06/07/2018 13:23   Dg Abd Portable 1v  Result Date: 07/02/2018 CLINICAL DATA:  Insertion of nasogastric tube EXAM: PORTABLE ABDOMEN - 1 VIEW COMPARISON:  Portable exam 1620 hours compared to 07/02/2018 FINDINGS: Nasogastric tube present with tip projecting over mid stomach. Scattered stool throughout colon. No definite bowel wall thickening or obstruction. Bones demineralized. Scattered vascular calcifications. IMPRESSION: Tip of nasogastric tube projects over mid stomach. Electronically  Signed   By: Lavonia Dana M.D.   On: 07/02/2018 16:38   Dg Duanne Limerick  W/kub  Result Date: 07/03/2018 CLINICAL DATA:  Gastric outlet obstruction. EXAM: UPPER GI SERIES WITHOUT KUB TECHNIQUE: Routine upper GI series was performed with thin barium. In this setting obstruction barium would not be usually indicated, but the patient is allergic to iodinated contrast. As much barium as possible was aspirated from the stomach at the termination of the exam (120 cc of barium was used and 450 cc of material was aspirated at the end of the exam). FLUOROSCOPY TIME:  Fluoroscopy Time:  2 minutes 48 seconds Radiation Exposure Index (if provided by the fluoroscopic device): 13.9 mGy Number of Acquired Spot Images: 0 COMPARISON:  Upper GI 12/31/2017.  Abdominal CT from 2 days ago FINDINGS: Despite nasogastric tube and continued wall section the stomach is still distended with gas and fluid and debris. Mucosal exam is not possible. Dilated stomach with pylorus high in the right upper quadrant. There is focal narrowing at the pyloric level with maximal channel opening of 4 mm. This narrowing is fairly smooth. No extraluminal mass was seen on prior CT is well. IMPRESSION: 1. Focal smooth narrowing at the pylorus with channel measuring 4 mm. 2. Despite  suction, the stomach remains distended by fluid/debris. Electronically Signed   By: Monte Fantasia M.D.   On: 07/03/2018 16:37     CBC Recent Labs  Lab 06/17/2018 1148  WBC 5.5  HGB 11.8*  HCT 34.9*  PLT 150  MCV 108.7*  MCH 36.8*  MCHC 33.8  RDW 13.3    Chemistries  Recent Labs  Lab 07/03/2018 1148 06/30/18 0500  NA 128* 130*  K 5.9* 3.9  CL 95* 100  CO2 24 23  GLUCOSE 105* 104*  BUN 12 9  CREATININE 0.61 0.43*  CALCIUM 8.4* 7.7*  AST 65*  --   ALT 32  --   ALKPHOS 28*  --   BILITOT 2.2*  --    ------------------------------------------------------------------------------------------------------------------ No results for input(s): CHOL, HDL, LDLCALC, TRIG, CHOLHDL, LDLDIRECT in the last 72 hours.  Lab Results  Component Value Date   HGBA1C  08/06/2009    5.2 (NOTE) The ADA recommends the following therapeutic goal for glycemic control related to Hgb A1c measurement: Goal of therapy: <6.5 Hgb A1c  Reference: American Diabetes Association: Clinical Practice Recommendations 2010, Diabetes Care, 2010, 33: (Suppl  1).   ------------------------------------------------------------------------------------------------------------------ No results for input(s): TSH, T4TOTAL, T3FREE, THYROIDAB in the last 72 hours.  Invalid input(s): FREET3 ------------------------------------------------------------------------------------------------------------------ No results for input(s): VITAMINB12, FOLATE, FERRITIN, TIBC, IRON, RETICCTPCT in the last 72 hours.  Coagulation profile No results for input(s): INR, PROTIME in the last 168 hours.  No results for input(s): DDIMER in the last 72 hours.  Cardiac Enzymes No results for input(s): CKMB, TROPONINI, MYOGLOBIN in the last 168 hours.  Invalid input(s): CK ------------------------------------------------------------------------------------------------------------------    Component Value Date/Time   BNP 127.6 (H)  06/10/2018 1128    Roxan Hockey M.D on 07/03/2018 at 6:12 PM  Pager---[phone] Go to [email or web address] - password [email or web address] for contact info  [facility] - Office  [phone]

## 2018-07-03 NOTE — Consult Note (Addendum)
Cardiology Consultation:   Patient ID: Angela [name]; [id number]; [birth year]   Admit date: 07/01/2018 Date of Consult: 07/03/2018  Primary Care Provider: [name], MD Primary Cardiologist: Mertie Moores, MD Primary Electrophysiologist:  Allegra Lai, MD   Patient Profile:   Angela [name] is a 82 y.o. female with a PMH of CAD s/p stent to LAD in 2008 (patent on last LHC 2017), chronic combined CHF (EF 45% 01/2016), HTN, HLD, aortic stenosis, persistent atrial fibrillation s/p DCCV 05/09/18, RA, PUD, and pyloric stenosis requiring multiple dilation procedures, who is being seen today for the evaluation of preoperative risk assessment at the request of CCS.  History of Present Illness:   Angela [name] presented with nausea and constipation. She was found to have a large stool burden on abdominal XR and was started on ana aggressive bowel regimen. Unfortunately she failed to improve and CT A/P revealed massibely distended stomach with food and fluid present and circumferential thickening of the anorectal mucosa. CCS was consulted and an NG tube was placed for decompression of her stomach. GI consulted and doubtful further dilation or G-POEM will improve her symptoms; planning for upper GI series but sounds like patient may require an elective gastrojejunostomy. Her eliquis was discontinued on 07/02/18 in the AM in anticipation of possible surgery. Cardiology asked to evaluate for preoperative risk assessment.   She was last seen by Dr. Acie Fredrickson outpatient 05/2018 for preoperative evaluation for planned EGD with dilation of pyloric channel. She had been maintaining NSR since DCCV 05/09/18 but had significant worsening of LE edema, felt to be 2/2 poor po intake and protein malnutrition. She was cleared for her procedure after 4 weeks of uninterrupted anticoagulation following her cardioversion. Given poor po intake and soft BP, her lasix was decreased to 40mg  daily. Her last ischemic evaluation was a LHC  in 2017 which revealed a patent mLAD stent with only 10% stenosis in the prox-mid LAD, otherwise normal coronaries and was felt to have an estimated EF of 55-65%. Prior echo in 2017 revealed an EF of 45% and moderate AS.   She is fairly sedentary at home. Mostly walks with a walker around her house and uses a scooter when going outdoors. He sleeps with the head of her bed elevated due to reflux. She has chronic LE edema which is unchanged in recent weeks. She reports one mechanical fall from a scooter while at the grocery store a week ago but denied headstrike. She denies any recent CP, SOB, DOE, PND, dizziness, lightheadedness, or syncope. She is anticipating going to [facility] at discharge from PT and PEG-J tube education.    Past Medical History:  Diagnosis Date  . A-fib ([place])   . Acute renal failure ([place]) 06/15/2013  . Aortic stenosis    mild by echo 03/2012  . Asthma   . Atrial fibrillation ([place])   . Atrial flutter ([place]) 01/16/2016  . CAD (coronary artery disease)    s/p PCI with BMS to mid LAD--2/08 not on aspirin due to frequent ulcers.  Repeat cath for CP 09/2012 with patent LAD stent and otherwise normal coronary arteries  . Cardiomyopathy ([place]) 01/30/2016  . Cervical stenosis of spine   . Chronic pain syndrome 12/02/2017  . Depression   . Duodenitis 01/16/2016  . Dyslipidemia   . Elbow fracture, right    history of- never any surgery  . Essential hypertension, benign 11/03/2013  . Gastric outlet obstruction 01/06/2016  . Gastroparesis 01/06/2016  . GERD (gastroesophageal reflux disease)   .  Headache   . Hypertension   . Hyponatremia 06/15/2013  . Impaired physical mobility    due to rheumatoid arthritis "ambulates with walker" limited free standing.  . Intestinal polyp   . Macular degeneration   . Obesity   . Osteoporosis    alendronate since 2012  . PUD (peptic ulcer disease)    can not take aspirin  . PVC's (premature ventricular contractions)   . RA (rheumatoid arthritis)  ([place])   . Rheumatoid arthritis(714.0)    Dr. Lenna Gilford follows  . Spondylolisthesis of lumbar region   . SVT (supraventricular tachycardia) ([place]) 01/16/2016    Past Surgical History:  Procedure Laterality Date  . ANKLE FUSION Bilateral    x2 both  . BALLOON DILATION N/A 01/10/2018   Procedure: BALLOON DILATION;  Surgeon: [name], MD;  Location: [facility];  Service: Gastroenterology;  Laterality: N/A;  . BALLOON DILATION N/A 01/24/2018   Procedure: Larrie Kass DILATION;  Surgeon: [name], MD;  Location: [facility];  Service: Gastroenterology;  Laterality: N/A;  W/ Fluoroscopy. Please use colonoscope.  Marland Kitchen BALLOON DILATION N/A 04/15/2018   Procedure: BALLOON DILATION;  Surgeon: [name], MD;  Location: [facility];  Service: Gastroenterology;  Laterality: N/A;  . BALLOON DILATION N/A 06/17/2018   Procedure: BALLOON DILATION;  Surgeon: [name], MD;  Location: [facility];  Service: Gastroenterology;  Laterality: N/A;  . BIOPSY  06/17/2018   Procedure: BIOPSY;  Surgeon: [name], MD;  Location: [facility];  Service: Gastroenterology;;  . CARDIAC CATHETERIZATION  09/2012   patent LAD stent and otherwise normal coronary arteries  . CARDIAC CATHETERIZATION N/A 03/07/2016   Procedure: Left Heart Cath and Coronary Angiography;  Surgeon: Kynnadi Dicenso M Martinique, MD;  Location: [facility];  Service: Cardiovascular;  Laterality: N/A;  . CARDIOVERSION N/A 03/13/2018   Procedure: CARDIOVERSION;  Surgeon: Lelon Perla, MD;  Location: [facility] ENDOSCOPY;  Service: Cardiovascular;  Laterality: N/A;  . CARDIOVERSION N/A 05/09/2018   Procedure: CARDIOVERSION;  Surgeon: Pixie Casino, MD;  Location: [facility] ENDOSCOPY;  Service: Cardiovascular;  Laterality: N/A;  . CATARACT EXTRACTION, BILATERAL Bilateral   . CERVICAL FUSION     limited  ROM  . CHOLECYSTECTOMY    . ESOPHAGOGASTRODUODENOSCOPY N/A 01/07/2016   Procedure: ESOPHAGOGASTRODUODENOSCOPY (EGD);  Surgeon: Wilford Corner, MD;  Location: [facility];  Service: Endoscopy;  Laterality: N/A;  . ESOPHAGOGASTRODUODENOSCOPY N/A 01/10/2018   Procedure: ESOPHAGOGASTRODUODENOSCOPY (EGD);  Surgeon: [name], MD;  Location: [facility];  Service: Gastroenterology;  Laterality: N/A;  . ESOPHAGOGASTRODUODENOSCOPY N/A 01/24/2018   Procedure: ESOPHAGOGASTRODUODENOSCOPY (EGD);  Surgeon: [name], MD;  Location: [place] [facility];  Service: Gastroenterology;  Laterality: N/A;  . ESOPHAGOGASTRODUODENOSCOPY N/A 04/15/2018   Procedure: ESOPHAGOGASTRODUODENOSCOPY (EGD);  Surgeon: [name], MD;  Location: [facility];  Service: Gastroenterology;  Laterality: N/A;  . ESOPHAGOGASTRODUODENOSCOPY N/A 06/17/2018   Procedure: ESOPHAGOGASTRODUODENOSCOPY (EGD);  Surgeon: [name], MD;  Location: [facility];  Service: Gastroenterology;  Laterality: N/A;  . ESOPHAGOGASTRODUODENOSCOPY (EGD) WITH PROPOFOL N/A 11/01/2015   Procedure: ESOPHAGOGASTRODUODENOSCOPY (EGD) WITH PROPOFOL w/ Dialtation;  Surgeon: Garlan Fair, MD;  Location: [facility];  Service: Endoscopy;  Laterality: N/A;  . FRACTURE SURGERY  09/2011   left ankle screw and pin removed  . HAND SURGERY Bilateral    x4 digits 2-5 both hands  . JOINT REPLACEMENT Bilateral    BTKA  . TONSILLECTOMY       Home Medications:  Prior to Admission medications   Medication Sig Start Date End Date Taking? Authorizing Provider  acebutolol (  SECTRAL) 200 MG capsule Take 1 capsule (200 mg total) by mouth daily. 03/10/18  Yes Bhagat, Bhavinkumar, PA  amiodarone (PACERONE) 100 MG tablet Take 1 tablet (100 mg total) by mouth 2 (two) times daily. 06/05/18  Yes Nahser, Wonda Cheng, MD  apixaban (ELIQUIS) 5 MG TABS tablet Take 1 tablet (5 mg total) by mouth 2 (two) times daily. 07/31/17  Yes Daune Perch, NP  butalbital-acetaminophen-caffeine (FIORICET, ESGIC) 50-325-40 MG per tablet Take 1 tablet by mouth every 6 (six) hours as needed for headache or migraine.  06/23/12  Yes [provider]  Calcium  Carbonate-Vitamin D (CALCIUM 500+D PO) Take 1 tablet by mouth 2 (two) times daily.    Yes [provider]  diclofenac sodium (VOLTAREN) 1 % GEL Apply 2 grams topically at bedtime to shoulders and elbows 01/29/18  Yes Meredith Staggers, MD  Docusate Calcium (STOOL SOFTENER PO) Take 1 tablet by mouth daily as needed (constipation).    Yes [provider]  escitalopram (LEXAPRO) 5 MG tablet TAKE 1 TABLET BY MOUTH IN THE MORNING Patient taking differently: Take 5 mg by mouth daily. TAKE 1 TABLET BY MOUTH IN THE MORNING 06/16/18  Yes Meredith Staggers, MD  ferrous sulfate 325 (65 FE) MG tablet Take 325 mg by mouth at bedtime.    Yes [provider]  folic acid (FOLVITE) 1 MG tablet Take 1 mg by mouth daily.  12/03/13  Yes [provider]  furosemide (LASIX) 40 MG tablet Take 1 tablet (40 mg total) by mouth daily. 05/23/18 08/21/18 Yes Nahser, Wonda Cheng, MD  lidocaine (LIDODERM) 5 % PLACE 3 PATCHES ONTO THE SKIN EVERY 12 (TWELVE) HOURS. APPLY NECK AND ELBOW Patient taking differently: Place 2 patches onto the skin daily.  11/01/17  Yes Meredith Staggers, MD  LORazepam (ATIVAN) 2 MG tablet Take 2 mg by mouth at bedtime. 02/05/15  Yes [provider]  methotrexate (RHEUMATREX) 2.5 MG tablet Take 15 mg by mouth every Saturday.  03/01/12  Yes [provider]  nitroGLYCERIN (NITROSTAT) 0.4 MG SL tablet Place 1 tablet (0.4 mg total) under the tongue every 5 (five) minutes as needed for chest pain. 02/21/16  Yes Turner, Eber Hong, MD  nystatin cream (MYCOSTATIN) Apply 1 application topically daily as needed for dry skin.   Yes [provider]  oxyCODONE-acetaminophen (PERCOCET) 7.5-325 MG tablet Take 1 tablet by mouth every 6 (six) hours as needed for moderate pain. 04/30/18  Yes Meredith Staggers, MD  pantoprazole (PROTONIX) 40 MG tablet Take 40 mg by mouth 2 (two) times daily.    Yes [provider]  Potassium Bicarb-Citric Acid (EFFER-K) 10 MEQ TBEF  Take 1 tablet (10 mEq total) by mouth daily. 05/23/18  Yes Nahser, Wonda Cheng, MD  predniSONE (DELTASONE) 5 MG tablet Take 5 mg by mouth daily with breakfast.    Yes [provider]  PREVIDENT 0.2 % SOLN Take 1 application by mouth at bedtime.  09/05/12  Yes [provider]  propranolol (INDERAL) 10 MG tablet Take 1 tablet (10 mg total) by mouth as needed (for palpitations). 03/10/18  Yes Bhagat, Bhavinkumar, PA  rosuvastatin (CRESTOR) 10 MG tablet Take 1 tablet (10 mg total) by mouth daily. Patient taking differently: Take 10 mg by mouth at bedtime.  03/11/18 06/09/2018 Yes Bhagat, Bhavinkumar, PA  TOVIAZ 8 MG TB24 tablet Take 8 mg by mouth daily. 10/25/15  Yes [provider]  vitamin B-12 (CYANOCOBALAMIN) 1000 MCG tablet Take 1,000 mcg by mouth daily.  Yes [provider]  vitamin C (ASCORBIC ACID) 500 MG tablet Take 500 mg by mouth daily.    Yes [provider]    Inpatient Medications: Scheduled Meds: . acebutolol  200 mg Oral Daily  . amiodarone  100 mg Oral BID  . calcium-vitamin D  1 tablet Oral BID  . diclofenac sodium  2 g Topical QID  . docusate sodium  100 mg Oral BID  . escitalopram  5 mg Oral Daily  . ferrous sulfate  325 mg Oral QHS  . fesoterodine  8 mg Oral Daily  . folic acid  1 mg Oral Daily  . LORazepam  2 mg Oral QHS  . polyethylene glycol  17 g Oral BID  . predniSONE  5 mg Oral Q breakfast  . rosuvastatin  10 mg Oral QHS  . sodium chloride flush  3 mL Intravenous Q12H  . vitamin B-12  1,000 mcg Oral Daily  . vitamin C  500 mg Oral Daily   Continuous Infusions: . sodium chloride    . dextrose 5 % and 0.9% NaCl 50 mL/hr at 07/02/18 1812  . famotidine (PEPCID) IV 20 mg (07/03/18 1345)   PRN Meds: sodium chloride, acetaminophen **OR** acetaminophen, bisacodyl, morphine injection, nitroGLYCERIN, ondansetron **OR** ondansetron (ZOFRAN) IV, oxyCODONE-acetaminophen, phenol, propranolol, sodium chloride flush  Allergies:      Allergies  Allergen Reactions  . Peanut-Containing Drug Products Anaphylaxis  . Reglan [Metoclopramide] Other (See Comments)    "Messed my mind up"  . Clindamycin/Lincomycin Diarrhea  . Codeine Nausea Only  . Iohexol Swelling and Other (See Comments)     Desc: pt received hypaque 60% in 1994 and had erythema, hives & lips swell.  She did ok w/o premeds in 8/05 at [facility].  Pt. takes daily prednisone for rheumatoid arthritis.  Poor venous access today (7/7/6) so we did her w/o iv contrast.   . Pineapple Swelling  . Shellfish Allergy Swelling and Other (See Comments)    Lips swell    Social History:   Social History   Socioeconomic History  . Marital status: Widowed    Spouse name: Not on file  . Number of children: Not on file  . Years of education: Not on file  . Highest education level: Not on file  Occupational History  . Not on file  Social Needs  . Financial resource strain: Not on file  . Food insecurity:    Worry: Not on file    Inability: Not on file  . Transportation needs:    Medical: Not on file    Non-medical: Not on file  Tobacco Use  . Smoking status: Never Smoker  . Smokeless tobacco: Never Used  Substance and Sexual Activity  . Alcohol use: No  . Drug use: No  . Sexual activity: Not on file  Lifestyle  . Physical activity:    Days per week: Not on file    Minutes per session: Not on file  . Stress: Not on file  Relationships  . Social connections:    Talks on phone: Not on file    Gets together: Not on file    Attends religious service: Not on file    Active member of club or organization: Not on file    Attends meetings of clubs or organizations: Not on file    Relationship status: Not on file  . Intimate partner violence:    Fear of current or ex partner: Not on file    Emotionally abused: Not  on file    Physically abused: Not on file    Forced sexual activity: Not on file  Other Topics Concern  . Not on file  Social History Narrative  .  Not on file    Family History:    Family History  Problem Relation Age of Onset  . Lung cancer Mother   . Arrhythmia Mother   . Melanoma Brother   . Heart attack Neg Hx      ROS:  Please see the history of present illness.   All other ROS reviewed and negative.     Physical Exam/Data:   Vitals:   07/02/18 1605 07/02/18 2210 07/03/18 0459 07/03/18 1246  BP: 118/60 (!) 123/59 (!) 100/53 (!) 112/57  Pulse: 65 64 (!) 56 (!) 58  Resp: 20 18 18 18   Temp:  98.1 F (36.7 C) 97.8 F (36.6 C)   TempSrc:      SpO2: 99% 96% 96% 98%  Weight:      Height:        Intake/Output Summary (Last 24 hours) at 07/03/2018 1529 Last data filed at 07/03/2018 0138 Gross per 24 hour  Intake 1300 ml  Output 1750 ml  Net -450 ml   Filed Weights   06/27/2018 1153  Weight: 48.5 kg   Body mass index is 20.2 kg/m.  General:  Pleasant thin elderly female laying in bed in NAD HEENT: sclera anicteric  Neck: no JVD Vascular: No carotid bruits; distal pulses 2+ bilaterally Cardiac:  normal S1, S2; RRR; +murmur, no rubs or gallops Lungs:  clear to auscultation bilaterally, no wheezing, rhonchi or rales  Abd: NABS, soft, nontender, no hepatomegaly; NG tube in place and draining.  Ext: 1+ LE edema Musculoskeletal:  No deformities, BUE and BLE strength normal and equal Skin: warm and dry  Neuro:  CNs 2-12 intact, no focal abnormalities noted Psych:  Normal affect   EKG:  The EKG was personally reviewed and demonstrates:  Sinus bradycardia with baseline wander but no apparent ischemic changes  Relevant CV Studies: Left heart catheterization 03/2016:  Prox LAD to Mid LAD lesion, 10% stenosed. The lesion was previously treated with a bare metal stent greater than two years ago.  The left ventricular systolic function is normal.   1. No obstructive CAD. Stent in the mid LAD is widely patent. 2. Good overall LV function. There is focal hypokinesis of the basal inferior wall that has been present  since cardiac cath in 2005.   Echocardiogram 01/2016: Study Conclusions  - Left ventricle: The cavity size was normal. Wall thickness was   normal. The estimated ejection fraction was 45%. Diffuse   hypokinesis. Although no diagnostic regional wall motion   abnormality was identified, this possibility cannot be completely   excluded on the basis of this study. - Aortic valve: There was moderate stenosis. Mean gradient (S): 16   mm Hg. Peak gradient (S): 29 mm Hg. VTI ratio of LVOT to aortic   valve: 0.18. - Mitral valve: Mildly thickened leaflets . There was mild   regurgitation. - Left atrium: The atrium was moderately to severely dilated. - Right atrium: Central venous pressure (est): 3 mm Hg. - Tricuspid valve: There was moderate regurgitation. - Pulmonary arteries: PA peak pressure: 34 mm Hg (S). - Pericardium, extracardiac: There was no pericardial effusion.  Impressions:  - Normal LV wall thickness with LVEF approximately 45%.   Indeterinate diastolic function. Mildly thickened mitral leaflets   with mild mitral regurgitation. Moderate to severe  left atrial   enlargement. Moderate aortic stenosis based on gradients and   planimatry. Moderate tricuspid regurgitation with PASP 34 mmHg.  Laboratory Data:  Chemistry Recent Labs  Lab 07/05/2018 1148 06/30/18 0500  NA 128* 130*  K 5.9* 3.9  CL 95* 100  CO2 24 23  GLUCOSE 105* 104*  BUN 12 9  CREATININE 0.61 0.43*  CALCIUM 8.4* 7.7*  GFRNONAA >60 >60  GFRAA >60 >60  ANIONGAP 9 7    Recent Labs  Lab 06/08/2018 1148  PROT 4.9*  ALBUMIN 3.4*  AST 65*  ALT 32  ALKPHOS 28*  BILITOT 2.2*   Hematology Recent Labs  Lab 06/08/2018 1148  WBC 5.5  RBC 3.21*  HGB 11.8*  HCT 34.9*  MCV 108.7*  MCH 36.8*  MCHC 33.8  RDW 13.3  PLT 150   Cardiac EnzymesNo results for input(s): TROPONINI in the last 168 hours.  Recent Labs  Lab 07/04/2018 1205  TROPIPOC 0.07    BNPNo results for input(s): BNP, PROBNP in the last  168 hours.  DDimer No results for input(s): DDIMER in the last 168 hours.  Radiology/Studies:  Ct Abdomen Pelvis Wo Contrast  Result Date: 07/01/2018 CLINICAL DATA:  Abdominal distention, pain, nausea, history of severe pyloric stenosis EXAM: CT ABDOMEN AND PELVIS WITHOUT CONTRAST TECHNIQUE: Multidetector CT imaging of the abdomen and pelvis was performed following the standard protocol without IV contrast. COMPARISON:  Chest and abdomen films of 06/09/2017 and CT abdomen pelvis of 01/10/2016 FINDINGS: Lower chest: There are moderate-sized bilateral pleural effusions present with bibasilar linear atelectasis. Moderate cardiomegaly is again noted. No pericardial effusion is seen. Hepatobiliary: The liver is unremarkable in this unenhanced study. Surgical clips are present from prior cholecystectomy. Pancreas: Pancreas appears atrophic. The pancreatic duct is not dilated. Spleen: The spleen is unremarkable with calcified splenic artery noted. Adrenals/Urinary Tract: Adrenal glands appear normal. No renal calculi are seen and there is no evidence of hydronephrosis. The ureters do not appear to be dilated there are difficult to follow in this patient. The urinary bladder is distended with no abnormality evident. Stomach/Bowel: The stomach is massively distended with fluid and food debris as well as oral contrast within this markedly distended stomach. In this patient with history of severe pyloric stenosis, recurrence of severe pyloric stenosis is the primary consideration. No definite mass is seen. No small bowel distention is seen. The mucosa of the ano-rectal region is somewhat thickened of questionable significance. Clinical correlation is recommended. The majority of the colon is decompressed and very difficult to assess. The terminal ileum is poorly visualized but the appendix does appear to fill with air and is unremarkable. Vascular/Lymphatic: Moderate abdominal aortic atherosclerosis is present. No  aneurysm is noted. There is no evidence of adenopathy. Reproductive: The uterus is somewhat atrophic. No adnexal lesion is seen. No fluid is noted within the pelvis. Other: No abdominal wall hernia is seen. Musculoskeletal: There is anterolisthesis of L4 on L5 by 5 mm and anterolisthesis of L3 on L4 by 8 mm. Both of these malalignments appear to be due to degenerative change of the facet joints. There is considerable degenerative disc disease at both L3-4 and L4-5 levels. No compression deformity is seen. There is mild degenerative joint disease of the hips present. IMPRESSION: 1. Massively distended stomach with food and fluid present. This most likely is due to recurrence of severe pyloric stenosis by history. 2. Circumferential thickening of the a no rectal mucosa of questionable significance. Correlate clinically. 3. Degenerative disc disease  at L3-4 and L4-5 as noted above. 4. Moderate-sized bilateral pleural effusions with bibasilar atelectasis. Cardiomegaly. Electronically Signed   By: Ivar Drape M.D.   On: 07/01/2018 13:28   Dg Abd Portable 1v  Result Date: 07/02/2018 CLINICAL DATA:  Insertion of nasogastric tube EXAM: PORTABLE ABDOMEN - 1 VIEW COMPARISON:  Portable exam 1620 hours compared to 06/20/2018 FINDINGS: Nasogastric tube present with tip projecting over mid stomach. Scattered stool throughout colon. No definite bowel wall thickening or obstruction. Bones demineralized. Scattered vascular calcifications. IMPRESSION: Tip of nasogastric tube projects over mid stomach. Electronically Signed   By: Lavonia Dana M.D.   On: 07/02/2018 16:38    Assessment and Plan:    1. Preoperative risk assessment: patient presented with abdominal pain and found to have a gastric outlet obstruction 2/2 severe pyloric stenosis. Cardiology was asked to provide risk assessment in the event that she requires surgery. She has a history of CAD s/p stent to mLAD; last cath 2017 with 10% stenosis of prox-mid LAD with  widely patent mLAD stent, otherwise no disease, and normal LV systolic function. She has a history of mild systolic dysfunction with last echo 01/2016 with EF 45%. No prior history of DM, CKD, or CVA/TIA.  - Based on the revised cardiac risk index, should she require an intraabdominal surgery, her score would be 3 (high risk surgery, history of ischemic heart disease, and history of CHF), with a 15% 30-day risk of an adverse cardiac event.  - No further cardiac work-up needed prior to surgery - Continue to hold eliquis in anticipation of surgery - resume once cleared by surgery team  2. Gastric outlet obstruction: patient has a history of severe pyloric stenosis s/p multiple dilations and persistent intolerance to po. GI and Surgery following and patient is likely going to need a gastrojejunostomy.  - Continue management per GI, surgery, and primary team  3. CAD s/p bare metal stent to mLAD in 2003: Last ischemic evaluation was a LHC in 2017 which revealed a patent stent and no other significant disease. No anginal complaints. EKG non-ischemic. Not on ASA due to DOAC need an increased bleeding risk with history of GIB. - Continue statin once able to tolerate po  4. Atrial fibrillation s/p DCCV 05/09/18: maintaining NSR. Eliquis on hold in anticipation of possible surgery. Amiodarone on hold given NPO status - Resume amiodarone once able to tolerate po. Can use IV amio if afib reoccurs - Resume eliquis once cleared by surgery team  5. Chronic combined CHF: Last echo 01/2016 with EF 45%, appeared improved on LHC 03/2016 with EF 55-65%. She has LE edema on exam, however this is likely 2/2 protein malnutrition 2/2 poor po intake in the setting of severe pyloric stenosis.  - Continue to monitor volume status closely in the perioperative setting  6. Aortic stenosis: noted to be moderate on last echo 2017. She is asymptomatic.  - Continue routine monitoring outpatient  7. HTN: BP stable. Po meds on hold -  Continue to monitor closely   For questions or updates, please contact [place] Please consult [email or web address] for contact info under Cardiology/STEMI.   Signed, Abigail Butts, PA-C  07/03/2018 3:29 PM [phone]  Patient examined chart reviewed Discussed care with PA and patient She is at moderate risk due to age, poor functional status history of PAF and CAD and moderate AS . Her CAD is stable no longer on DAT and widely patent stent to LAD By f/u cath 2017 no angina She is  in NSR and has been on oral amiodarone. Eliquis has been held [facility] to proceed with GI surgery in am so patient can try to have ability to take PO again Use iv amiodarone if she has PAF. Exam with frail elderly female clear lungs NG tube aortic stenosis murmur no edema She understands there is some risk but the alternative is to have a feeding tube  Jenkins [place]

## 2018-07-03 NOTE — Progress Notes (Signed)
[facility] Progress Note     Subjective: CC:  abd pain and distention improved with NGT. Reports filling over 3 NG tube canisters since NG placed yesterday.  Requesting ice chips. Constipation at baseline. +flatus last night.    Objective: Vital signs in last 24 hours: Temp:  [97.8 F (36.6 C)-98.1 F (36.7 C)] 97.8 F (36.6 C) (08/29 0459) Pulse Rate:  [56-65] 56 (08/29 0459) Resp:  [18-20] 18 (08/29 0459) BP: (100-123)/(53-60) 100/53 (08/29 0459) SpO2:  [96 %-99 %] 96 % (08/29 0459) Last BM Date: 07/01/18  Intake/Output from previous day: 08/28 0701 - 08/29 0700 In: 1300 [I.V.:1300] Out: 1750 [Emesis/NG output:1750] Intake/Output this shift: No intake/output data recorded.  PE: Gen:  Alert, NAD, pleasant HEENT: NGT in place, bilious emesis in cannister Card:  Regular rate and rhythm, pedal pulses 2+ BL  Pulm:  Normal effort, clear to auscultation bilaterally Abd: Soft, mild distention, nontender, hypoactive BS Skin: warm and dry, no rashes  Psych: A&Ox3   Lab Results:  No results for input(s): WBC, HGB, HCT, PLT in the last 72 hours. BMET No results for input(s): NA, K, CL, CO2, GLUCOSE, BUN, CREATININE, CALCIUM in the last 72 hours. PT/INR No results for input(s): LABPROT, INR in the last 72 hours. CMP     Component Value Date/Time   NA 130 (L) 06/30/2018 0500   NA 135 06/09/2018 1324   K 3.9 06/30/2018 0500   CL 100 06/30/2018 0500   CO2 23 06/30/2018 0500   GLUCOSE 104 (H) 06/30/2018 0500   BUN 9 06/30/2018 0500   BUN 13 06/09/2018 1324   CREATININE 0.43 (L) 06/30/2018 0500   CREATININE 0.60 05/25/2016 1252   CALCIUM 7.7 (L) 06/30/2018 0500   PROT 4.9 (L) 06/19/2018 1148   ALBUMIN 3.4 (L) 06/19/2018 1148   AST 65 (H) 06/28/2018 1148   ALT 32 06/21/2018 1148   ALKPHOS 28 (L) 06/15/2018 1148   BILITOT 2.2 (H) 06/18/2018 1148   GFRNONAA >60 06/30/2018 0500   GFRAA >60 06/30/2018 0500   Lipase     Component Value Date/Time   LIPASE 34  06/25/2018 1148       Studies/Results: Ct Abdomen Pelvis Wo Contrast  Result Date: 07/01/2018 CLINICAL DATA:  Abdominal distention, pain, nausea, history of severe pyloric stenosis EXAM: CT ABDOMEN AND PELVIS WITHOUT CONTRAST TECHNIQUE: Multidetector CT imaging of the abdomen and pelvis was performed following the standard protocol without IV contrast. COMPARISON:  Chest and abdomen films of 06/09/2017 and CT abdomen pelvis of 01/10/2016 FINDINGS: Lower chest: There are moderate-sized bilateral pleural effusions present with bibasilar linear atelectasis. Moderate cardiomegaly is again noted. No pericardial effusion is seen. Hepatobiliary: The liver is unremarkable in this unenhanced study. Surgical clips are present from prior cholecystectomy. Pancreas: Pancreas appears atrophic. The pancreatic duct is not dilated. Spleen: The spleen is unremarkable with calcified splenic artery noted. Adrenals/Urinary Tract: Adrenal glands appear normal. No renal calculi are seen and there is no evidence of hydronephrosis. The ureters do not appear to be dilated there are difficult to follow in this patient. The urinary bladder is distended with no abnormality evident. Stomach/Bowel: The stomach is massively distended with fluid and food debris as well as oral contrast within this markedly distended stomach. In this patient with history of severe pyloric stenosis, recurrence of severe pyloric stenosis is the primary consideration. No definite mass is seen. No small bowel distention is seen. The mucosa of the ano-rectal region is somewhat thickened of questionable significance. Clinical correlation  is recommended. The majority of the colon is decompressed and very difficult to assess. The terminal ileum is poorly visualized but the appendix does appear to fill with air and is unremarkable. Vascular/Lymphatic: Moderate abdominal aortic atherosclerosis is present. No aneurysm is noted. There is no evidence of adenopathy.  Reproductive: The uterus is somewhat atrophic. No adnexal lesion is seen. No fluid is noted within the pelvis. Other: No abdominal wall hernia is seen. Musculoskeletal: There is anterolisthesis of L4 on L5 by 5 mm and anterolisthesis of L3 on L4 by 8 mm. Both of these malalignments appear to be due to degenerative change of the facet joints. There is considerable degenerative disc disease at both L3-4 and L4-5 levels. No compression deformity is seen. There is mild degenerative joint disease of the hips present. IMPRESSION: 1. Massively distended stomach with food and fluid present. This most likely is due to recurrence of severe pyloric stenosis by history. 2. Circumferential thickening of the a no rectal mucosa of questionable significance. Correlate clinically. 3. Degenerative disc disease at L3-4 and L4-5 as noted above. 4. Moderate-sized bilateral pleural effusions with bibasilar atelectasis. Cardiomegaly. Electronically Signed   By: Ivar Drape M.D.   On: 07/01/2018 13:28   Dg Abd Portable 1v  Result Date: 07/02/2018 CLINICAL DATA:  Insertion of nasogastric tube EXAM: PORTABLE ABDOMEN - 1 VIEW COMPARISON:  Portable exam 1620 hours compared to 06/24/2018 FINDINGS: Nasogastric tube present with tip projecting over mid stomach. Scattered stool throughout colon. No definite bowel wall thickening or obstruction. Bones demineralized. Scattered vascular calcifications. IMPRESSION: Tip of nasogastric tube projects over mid stomach. Electronically Signed   By: Lavonia Dana M.D.   On: 07/02/2018 16:38    Anti-infectives: Anti-infectives (From admission, onward)   Start     Dose/Rate Route Frequency Ordered Stop   06/30/18 0645  cefTRIAXone (ROCEPHIN) 1 g in sodium chloride 0.9 % 100 mL IVPB  Status:  Discontinued     1 g 200 mL/hr over 30 Minutes Intravenous Every 24 hours 06/30/18 0643 07/02/18 1655   07/05/2018 1500  cefTRIAXone (ROCEPHIN) 1 g in sodium chloride 0.9 % 100 mL IVPB  Status:  Discontinued      1 g 200 mL/hr over 30 Minutes Intravenous Every 24 hours 06/14/2018 1451 06/12/2018 1528     Assessment/Plan CAD s/p stent in 2008, not on ASA due to frequent ulcers SVT HTN Atrial fibrillation PUD  Gastroparesis and GOO Rheumatoid arthritis/Spondylolisthesis/Chronic pain syndrome Osteoporosis UTI - on IV rocephin  Constipation   Gastric outlet obstruction secondary to severe pyloric stenosis - EGD with bx 8/13 did not show malignancy, need GI to confirm but it seems that pyloric stenosis most likely due to PUD - past balloon dilations 01/10/18, 01/24/18, 04/15/18, 06/17/18 - all by Dr. Therisa Doyne - continue NG tube to LIWS, transition all meds to IV  - UGI pending, GI following - continue to hold eliquis, heparin gtt ok  - Patient will likely require gastrojejunostomy, with possible gastrostomy and jejunostomy tubes, this admission. We will follow above imaging and discuss with GI before further surgical planning.  - get cardiac clearance in case patient requires urgent surgery or elective outpatient surgery    LOS: 3 days    Obie Dredge, [facility] Pager: [phone]

## 2018-07-03 NOTE — Progress Notes (Signed)
[facility]astro[facility]ology Progress Note  Angela [name] 82 y.o. 1936-01-13  CC:  gastric outlet obstruction, pyloric stenosis, gastroparesis   Subjective: feeling better today after NG suction. Abdominal distention improved. No bowel movement today. Passing flatus.  ROS : negative for active chest pain or shortness of breath.   Objective: Vital signs in last 24 hours: Vitals:   07/03/18 0459 07/03/18 1246  BP: (!) 100/53 (!) 112/57  Pulse: (!) 56 (!) 58  Resp: 18 18  Temp: 97.8 F (36.6 C)   SpO2: 96% 98%    Physical Exam:  General. Elderly-appearing patient. Not in acute distress. NG tube in place Heart. Regular rhythm regular, no murmur present Abdomen. Soft, non-tender, nondistended, bowel sounds present. Psych- mood and affect normal.  Lab Results: No results for input(s): NA, K, CL, CO2, GLUCOSE, BUN, CREATININE, CALCIUM, MG, PHOS in the last 72 hours. No results for input(s): AST, ALT, ALKPHOS, BILITOT, PROT, ALBUMIN in the last 72 hours. No results for input(s): WBC, NEUTROABS, HGB, HCT, MCV, PLT in the last 72 hours. No results for input(s): LABPROT, INR in the last 72 hours.    Assessment/Plan: - gastric outlet obstruction from chronic  pyloric channel stenosis and abnormal anatomy with J shaped  Stomach as well as pyloric channel deformity from previous ulceration  biopsies negative in the past. Most likely benign stricture as it has been present since 2017. Multiple balloon dilatation recently without any improvement in symptoms. I think underlying gastroparesis is also contributing to her symptoms. Recent development of constipation also suggest that she has a underlying motility disorder. - poor oral intake - Abnormal CT scan showing circumferential thickening of endorectal mucosa of questionable significance. Probably from recent multiple enemas.  - Afib - On Eliquis  - H/O CAD   Recommendations --------------------------- - Discuss with Dr. Therisa Doyne. Because  of abnormal anatomy as well as underlying gastroparesis, endoscopic treatment such as ESD and G-POEM will not improve her symptoms.  - upper GI series to document patency of the pylorus as well as to check the anatomy of the stomach and duodenum. - D/W Dr. Georgette Dover, tentatively planning for elective gastrojejunostomy. - GI will follow.    Otis Brace MD, [place] 07/03/2018, 2:11 PM  Contact #  [phone]

## 2018-07-04 ENCOUNTER — Inpatient Hospital Stay (HOSPITAL_COMMUNITY): Payer: Medicare Other | Admitting: Certified Registered Nurse Anesthetist

## 2018-07-04 ENCOUNTER — Encounter (HOSPITAL_COMMUNITY): Admission: EM | Disposition: E | Payer: Self-pay | Source: Home / Self Care | Attending: Family Medicine

## 2018-07-04 ENCOUNTER — Encounter (HOSPITAL_COMMUNITY): Payer: Self-pay | Admitting: Certified Registered Nurse Anesthetist

## 2018-07-04 DIAGNOSIS — I480 Paroxysmal atrial fibrillation: Secondary | ICD-10-CM

## 2018-07-04 DIAGNOSIS — K5900 Constipation, unspecified: Secondary | ICD-10-CM

## 2018-07-04 HISTORY — PX: GASTROJEJUNOSTOMY: SHX1697

## 2018-07-04 HISTORY — PX: GASTROSTOMY: SHX5249

## 2018-07-04 LAB — APTT: aPTT: 25 seconds (ref 24–36)

## 2018-07-04 LAB — HEPARIN LEVEL (UNFRACTIONATED): Heparin Unfractionated: 1.9 IU/mL — ABNORMAL HIGH (ref 0.30–0.70)

## 2018-07-04 SURGERY — GASTROJEJUNOSTOMY
Anesthesia: General | Site: Abdomen

## 2018-07-04 MED ORDER — PROPRANOLOL HCL 20 MG PO TABS
10.0000 mg | ORAL_TABLET | Freq: Three times a day (TID) | ORAL | Status: DC | PRN
  Filled 2018-07-04: qty 1

## 2018-07-04 MED ORDER — BUPIVACAINE LIPOSOME 1.3 % IJ SUSP
INTRAMUSCULAR | Status: DC | PRN
  Administered 2018-07-04: 20 mL

## 2018-07-04 MED ORDER — PROPOFOL 10 MG/ML IV BOLUS
INTRAVENOUS | Status: AC
  Filled 2018-07-04: qty 20

## 2018-07-04 MED ORDER — PROMETHAZINE HCL 25 MG/ML IJ SOLN: 3.1250 mg | Freq: Once | INTRAMUSCULAR | Status: DC

## 2018-07-04 MED ORDER — PROPOFOL 10 MG/ML IV BOLUS
INTRAVENOUS | Status: DC | PRN
  Administered 2018-07-04: 80 mg via INTRAVENOUS

## 2018-07-04 MED ORDER — LIDOCAINE 2% (20 MG/ML) 5 ML SYRINGE
INTRAMUSCULAR | Status: AC
  Filled 2018-07-04: qty 5

## 2018-07-04 MED ORDER — ESCITALOPRAM OXALATE 10 MG PO TABS
5.0000 mg | ORAL_TABLET | Freq: Every day | ORAL | Status: DC
  Administered 2018-07-05 – 2018-07-10 (×6): 5 mg
  Filled 2018-07-04 (×6): qty 1

## 2018-07-04 MED ORDER — ACEBUTOLOL HCL 200 MG PO CAPS
200.0000 mg | ORAL_CAPSULE | Freq: Every day | ORAL | Status: DC
  Administered 2018-07-05 – 2018-07-10 (×4): 200 mg via JEJUNOSTOMY
  Filled 2018-07-04 (×6): qty 1

## 2018-07-04 MED ORDER — ONDANSETRON HCL 4 MG/2ML IJ SOLN
INTRAMUSCULAR | Status: DC | PRN
  Administered 2018-07-04: 4 mg via INTRAVENOUS

## 2018-07-04 MED ORDER — EPHEDRINE SULFATE-NACL 50-0.9 MG/10ML-% IV SOSY
PREFILLED_SYRINGE | INTRAVENOUS | Status: DC | PRN
  Administered 2018-07-04 (×3): 5 mg via INTRAVENOUS

## 2018-07-04 MED ORDER — CALCIUM CARBONATE ANTACID 1250 MG/5ML PO SUSP
1250.0000 mg | Freq: Two times a day (BID) | ORAL | Status: DC
  Administered 2018-07-04 – 2018-07-10 (×12): 1250 mg via JEJUNOSTOMY
  Filled 2018-07-04 (×14): qty 5

## 2018-07-04 MED ORDER — FESOTERODINE FUMARATE ER 8 MG PO TB24
8.0000 mg | ORAL_TABLET | Freq: Every day | ORAL | Status: DC
  Administered 2018-07-05 – 2018-07-10 (×6): 8 mg via ORAL
  Filled 2018-07-04 (×6): qty 1

## 2018-07-04 MED ORDER — BUPIVACAINE LIPOSOME 1.3 % IJ SUSP
20.0000 mL | Freq: Once | INTRAMUSCULAR | Status: DC
  Filled 2018-07-04: qty 20

## 2018-07-04 MED ORDER — ROCURONIUM BROMIDE 50 MG/5ML IV SOSY
PREFILLED_SYRINGE | INTRAVENOUS | Status: AC
  Filled 2018-07-04: qty 5

## 2018-07-04 MED ORDER — SODIUM CHLORIDE 0.9 % IV SOLN
INTRAVENOUS | Status: DC | PRN
  Administered 2018-07-04: 25 ug/min via INTRAVENOUS

## 2018-07-04 MED ORDER — LORAZEPAM 2 MG/ML IJ SOLN
1.0000 mg | Freq: Once | INTRAMUSCULAR | Status: DC
  Filled 2018-07-04 (×2): qty 1

## 2018-07-04 MED ORDER — ALBUMIN HUMAN 5 % IV SOLN
INTRAVENOUS | Status: AC
  Filled 2018-07-04: qty 250

## 2018-07-04 MED ORDER — HEPARIN (PORCINE) IN NACL 100-0.45 UNIT/ML-% IJ SOLN
700.0000 [IU]/h | INTRAMUSCULAR | Status: DC
  Administered 2018-07-04: 700 [IU]/h via INTRAVENOUS
  Filled 2018-07-04: qty 250

## 2018-07-04 MED ORDER — SUCCINYLCHOLINE CHLORIDE 200 MG/10ML IV SOSY
PREFILLED_SYRINGE | INTRAVENOUS | Status: DC | PRN
  Administered 2018-07-04: 80 mg via INTRAVENOUS

## 2018-07-04 MED ORDER — ONDANSETRON HCL 4 MG/2ML IJ SOLN
INTRAMUSCULAR | Status: AC
  Filled 2018-07-04: qty 2

## 2018-07-04 MED ORDER — DEXAMETHASONE SODIUM PHOSPHATE 10 MG/ML IJ SOLN
INTRAMUSCULAR | Status: DC | PRN
  Administered 2018-07-04: 5 mg via INTRAVENOUS

## 2018-07-04 MED ORDER — PHENYLEPHRINE 40 MCG/ML (10ML) SYRINGE FOR IV PUSH (FOR BLOOD PRESSURE SUPPORT)
PREFILLED_SYRINGE | INTRAVENOUS | Status: DC | PRN
  Administered 2018-07-04 (×3): 40 ug via INTRAVENOUS

## 2018-07-04 MED ORDER — LACTATED RINGERS IV SOLN
INTRAVENOUS | Status: DC
  Administered 2018-07-04: 10:00:00 via INTRAVENOUS

## 2018-07-04 MED ORDER — CHOLECALCIFEROL 400 UNIT/ML PO LIQD
200.0000 [IU] | Freq: Two times a day (BID) | ORAL | Status: DC
  Administered 2018-07-04 – 2018-07-09 (×11): 200 [IU]
  Filled 2018-07-04 (×12): qty 0.5

## 2018-07-04 MED ORDER — PREDNISOLONE SODIUM PHOSPHATE 15 MG/5ML PO SOLN
5.0000 mg | Freq: Every day | ORAL | Status: DC
  Administered 2018-07-05: 5 mg
  Filled 2018-07-04: qty 5

## 2018-07-04 MED ORDER — SUGAMMADEX SODIUM 200 MG/2ML IV SOLN
INTRAVENOUS | Status: DC | PRN
  Administered 2018-07-04: 100 mg via INTRAVENOUS

## 2018-07-04 MED ORDER — SUCCINYLCHOLINE CHLORIDE 200 MG/10ML IV SOSY
PREFILLED_SYRINGE | INTRAVENOUS | Status: AC
  Filled 2018-07-04: qty 10

## 2018-07-04 MED ORDER — EPHEDRINE 5 MG/ML INJ
INTRAVENOUS | Status: AC
  Filled 2018-07-04: qty 10

## 2018-07-04 MED ORDER — PHENYLEPHRINE 40 MCG/ML (10ML) SYRINGE FOR IV PUSH (FOR BLOOD PRESSURE SUPPORT)
PREFILLED_SYRINGE | INTRAVENOUS | Status: AC
  Filled 2018-07-04: qty 10

## 2018-07-04 MED ORDER — SODIUM CHLORIDE 0.9 % IV SOLN
1.0000 g | INTRAVENOUS | Status: AC
  Administered 2018-07-04: 1 g via INTRAVENOUS
  Filled 2018-07-04 (×2): qty 1

## 2018-07-04 MED ORDER — ALBUMIN HUMAN 5 % IV SOLN
12.5000 g | Freq: Once | INTRAVENOUS | Status: AC
  Administered 2018-07-04: 12.5 g via INTRAVENOUS

## 2018-07-04 MED ORDER — FENTANYL CITRATE (PF) 250 MCG/5ML IJ SOLN
INTRAMUSCULAR | Status: AC
  Filled 2018-07-04: qty 5

## 2018-07-04 MED ORDER — FENTANYL CITRATE (PF) 100 MCG/2ML IJ SOLN
INTRAMUSCULAR | Status: DC | PRN
  Administered 2018-07-04 (×3): 50 ug via INTRAVENOUS

## 2018-07-04 MED ORDER — SODIUM CHLORIDE 0.9 % IJ SOLN
INTRAMUSCULAR | Status: DC | PRN
  Administered 2018-07-04 (×2): 10 mL

## 2018-07-04 MED ORDER — FENTANYL CITRATE (PF) 100 MCG/2ML IJ SOLN
25.0000 ug | INTRAMUSCULAR | Status: DC | PRN
  Administered 2018-07-04 (×2): 25 ug via INTRAVENOUS

## 2018-07-04 MED ORDER — ACETAMINOPHEN 10 MG/ML IV SOLN
1000.0000 mg | Freq: Four times a day (QID) | INTRAVENOUS | Status: AC
  Administered 2018-07-04 – 2018-07-05 (×4): 1000 mg via INTRAVENOUS
  Filled 2018-07-04 (×6): qty 100

## 2018-07-04 MED ORDER — DEXAMETHASONE SODIUM PHOSPHATE 10 MG/ML IJ SOLN
INTRAMUSCULAR | Status: AC
  Filled 2018-07-04: qty 1

## 2018-07-04 MED ORDER — VITAMIN C 500 MG PO TABS
500.0000 mg | ORAL_TABLET | Freq: Every day | ORAL | Status: DC
  Administered 2018-07-05 – 2018-07-10 (×6): 500 mg via JEJUNOSTOMY
  Filled 2018-07-04 (×6): qty 1

## 2018-07-04 MED ORDER — LIDOCAINE 2% (20 MG/ML) 5 ML SYRINGE
INTRAMUSCULAR | Status: DC | PRN
  Administered 2018-07-04: 40 mg via INTRAVENOUS

## 2018-07-04 MED ORDER — LORAZEPAM 1 MG PO TABS
2.0000 mg | ORAL_TABLET | Freq: Every day | ORAL | Status: DC
  Administered 2018-07-05 – 2018-07-09 (×5): 2 mg
  Filled 2018-07-04 (×5): qty 2

## 2018-07-04 MED ORDER — HYDROCORTISONE NA SUCCINATE PF 100 MG IJ SOLR
50.0000 mg | Freq: Three times a day (TID) | INTRAMUSCULAR | Status: AC
  Administered 2018-07-04 – 2018-07-05 (×3): 50 mg via INTRAVENOUS
  Filled 2018-07-04 (×3): qty 2

## 2018-07-04 MED ORDER — FENTANYL CITRATE (PF) 100 MCG/2ML IJ SOLN
INTRAMUSCULAR | Status: AC
  Filled 2018-07-04: qty 2

## 2018-07-04 MED ORDER — VITAMIN B-12 1000 MCG PO TABS
1000.0000 ug | ORAL_TABLET | Freq: Every day | ORAL | Status: DC
  Administered 2018-07-05 – 2018-07-10 (×6): 1000 ug via JEJUNOSTOMY
  Filled 2018-07-04 (×6): qty 1

## 2018-07-04 MED ORDER — BACITRACIN-NEOMYCIN-POLYMYXIN 400-5-5000 EX OINT
1.0000 "application " | TOPICAL_OINTMENT | Freq: Every day | CUTANEOUS | Status: AC
  Administered 2018-07-04 – 2018-07-10 (×6): 1 via TOPICAL
  Filled 2018-07-04 (×4): qty 1

## 2018-07-04 MED ORDER — ROSUVASTATIN CALCIUM 10 MG PO TABS
10.0000 mg | ORAL_TABLET | Freq: Every day | ORAL | Status: DC
  Administered 2018-07-05 – 2018-07-09 (×5): 10 mg via JEJUNOSTOMY
  Filled 2018-07-04 (×5): qty 1

## 2018-07-04 MED ORDER — ROCURONIUM BROMIDE 50 MG/5ML IV SOSY
PREFILLED_SYRINGE | INTRAVENOUS | Status: DC | PRN
  Administered 2018-07-04: 10 mg via INTRAVENOUS
  Administered 2018-07-04: 50 mg via INTRAVENOUS

## 2018-07-04 MED ORDER — AMIODARONE HCL 100 MG PO TABS
100.0000 mg | ORAL_TABLET | Freq: Two times a day (BID) | ORAL | Status: DC
  Administered 2018-07-05 – 2018-07-14 (×17): 100 mg via JEJUNOSTOMY
  Filled 2018-07-04 (×19): qty 1

## 2018-07-04 MED ORDER — PREDNISONE 5 MG PO TABS: 5.0000 mg | ORAL_TABLET | Freq: Every day | ORAL | Status: DC

## 2018-07-04 MED ORDER — DOCUSATE SODIUM 50 MG/5ML PO LIQD
100.0000 mg | Freq: Two times a day (BID) | ORAL | Status: DC
  Administered 2018-07-04 – 2018-07-13 (×18): 100 mg via JEJUNOSTOMY
  Filled 2018-07-04 (×18): qty 10

## 2018-07-04 MED ORDER — 0.9 % SODIUM CHLORIDE (POUR BTL) OPTIME
TOPICAL | Status: DC | PRN
  Administered 2018-07-04 (×2): 1000 mL

## 2018-07-04 SURGICAL SUPPLY — 49 items
BAG URINE DRAINAGE (UROLOGICAL SUPPLIES) ×1 IMPLANT
BLADE CLIPPER SURG (BLADE) IMPLANT
CANISTER SUCT 3000ML PPV (MISCELLANEOUS) ×3 IMPLANT
CATH GASTROSTOMY 20FR (CATHETERS) ×2 IMPLANT
CHLORAPREP W/TINT 26ML (MISCELLANEOUS) ×3 IMPLANT
COVER SURGICAL LIGHT HANDLE (MISCELLANEOUS) ×3 IMPLANT
DRAPE LAPAROSCOPIC ABDOMINAL (DRAPES) ×3 IMPLANT
DRAPE WARM FLUID 44X44 (DRAPE) ×3 IMPLANT
DRSG OPSITE POSTOP 4X6 (GAUZE/BANDAGES/DRESSINGS) ×2 IMPLANT
ELECT CAUTERY BLADE 6.4 (BLADE) ×4 IMPLANT
ELECT REM PT RETURN 9FT ADLT (ELECTROSURGICAL) ×3
ELECTRODE REM PT RTRN 9FT ADLT (ELECTROSURGICAL) ×1 IMPLANT
GLOVE BIO SURGEON STRL SZ7 (GLOVE) ×7 IMPLANT
GLOVE BIO SURGEON STRL SZ8 (GLOVE) ×2 IMPLANT
GLOVE BIOGEL PI IND STRL 6.5 (GLOVE) IMPLANT
GLOVE BIOGEL PI IND STRL 7.0 (GLOVE) IMPLANT
GLOVE BIOGEL PI IND STRL 7.5 (GLOVE) ×1 IMPLANT
GLOVE BIOGEL PI IND STRL 8 (GLOVE) IMPLANT
GLOVE BIOGEL PI INDICATOR 6.5 (GLOVE) ×4
GLOVE BIOGEL PI INDICATOR 7.0 (GLOVE) ×2
GLOVE BIOGEL PI INDICATOR 7.5 (GLOVE) ×2
GLOVE BIOGEL PI INDICATOR 8 (GLOVE) ×2
GOWN STRL REUS W/ TWL LRG LVL3 (GOWN DISPOSABLE) ×2 IMPLANT
GOWN STRL REUS W/ TWL XL LVL3 (GOWN DISPOSABLE) IMPLANT
GOWN STRL REUS W/TWL LRG LVL3 (GOWN DISPOSABLE) ×6
GOWN STRL REUS W/TWL XL LVL3 (GOWN DISPOSABLE) ×3
KIT BASIN OR (CUSTOM PROCEDURE TRAY) ×3 IMPLANT
KIT TUBE JEJUNAL 16FR (CATHETERS) ×2 IMPLANT
KIT TURNOVER KIT B (KITS) ×3 IMPLANT
NDL HYPO 25GX1X1/2 BEV (NEEDLE) IMPLANT
NEEDLE HYPO 25GX1X1/2 BEV (NEEDLE) ×3 IMPLANT
NS IRRIG 1000ML POUR BTL (IV SOLUTION) ×6 IMPLANT
PACK GENERAL/GYN (CUSTOM PROCEDURE TRAY) ×3 IMPLANT
PAD ARMBOARD 7.5X6 YLW CONV (MISCELLANEOUS) ×7 IMPLANT
PENCIL SMOKE EVACUATOR (MISCELLANEOUS) ×2 IMPLANT
STAPLER GUN LINEAR PROX 60 (STAPLE) ×2 IMPLANT
STAPLER PROXIMATE 75MM BLUE (STAPLE) ×2 IMPLANT
STAPLER VISISTAT 35W (STAPLE) ×3 IMPLANT
SUCTION POOLE TIP (SUCTIONS) ×3 IMPLANT
SUT ETHILON 2 0 FS 18 (SUTURE) ×3 IMPLANT
SUT NOVA 1 T20/GS 25DT (SUTURE) ×2 IMPLANT
SUT NOVA NAB DX-16 0-1 5-0 T12 (SUTURE) ×4 IMPLANT
SUT SILK 2 0 SH CR/8 (SUTURE) ×3 IMPLANT
SUT SILK 3 0 SH CR/8 (SUTURE) ×9 IMPLANT
SUT VICRYL 3 0 TIE (SUTURE) ×1 IMPLANT
SYR CONTROL 10ML LL (SYRINGE) ×4 IMPLANT
SYRINGE TOOMEY DISP (SYRINGE) ×3 IMPLANT
TRAY FOLEY MTR SLVR 16FR STAT (SET/KITS/TRAYS/PACK) ×2 IMPLANT
YANKAUER SUCT BULB TIP NO VENT (SUCTIONS) ×2 IMPLANT

## 2018-07-04 NOTE — Op Note (Signed)
Preop diagnosis: Chronic gastric outlet obstruction secondary to peptic ulcer disease Postop diagnosis: Same Procedure performed: #1 gastrojejunostomy 2.  Placement of gastrostomy tube 3.  Placement of feeding jejunostomy tube Surgeon:Jonae Renshaw K Laquesha Holcomb Assistant: Dr. Georganna Skeans Anesthesia: General endotracheal Indications: This is a 82 year old female with a long history of peptic ulcer.  Patient was developed severe stenosis at her pylorus.  This is been examined endoscopically and biopsied showing only benign stricture.  This area has been dilated several times with minimal improvement.  She comes in to the hospital with near complete obstruction at her pylorus.  She was decompressed with a nasogastric tube.  Cardiology has cleared the patient at moderate risk for surgery.  She presents now for gastrojejunostomy as well as feeding jejunostomy and draining gastrostomy tube placement.  Description of procedure: The patient is brought to the operating room and placed in the supine position on the operating room table.  After an adequate level of general anesthesia was obtained, a Foley catheter was placed under sterile technique.  A midline incision was made above the umbilicus measuring about 3 inches.  We dissected down the fascia.  We entered the peritoneal cavity through the linea alba.  There is no sign of purulent drainage.  There is minimal ascites.  The stomach appears fairly healthy but is fairly large.  The pylorus is palpated.  There are no extrinsic masses in this area.  However the pylorus feels fairly firm and small in diameter.  We identified the small bowel at the ligament of Treitz.  We measured a area of the jejunum about 20 cm distal to the ligament of Treitz.  We brought this antecolic up to the stomach.  We created a side-to-side gastrojejunostomy using a GIA-75 stapler.  The gastrostomy was created at the most dependent portion of the greater curvature of the stomach.  We inspected  the interior of the anastomosis.  There is no bleeding.  We decompressed the stomach by suctioning out a large amount of retained food products.  No bleeding is noted within the stomach.  The anterior opening of the staple line was then closed with a TA 60 stapler.  We oversewed the staple line with interrupted 3-0 silk sutures.  Several sutures were used to pex the small bowel up to the stomach to prevent twisting.  We then turned our attention to the jejunostomy.  We selected a point on the jejunum about 20 cm distal to our gastrojejunal anastomosis.  A 16 French jejunostomy tube was brought in through a stab incision on the left side of the abdomen.  We made a pursestring suture of 3-0 silk in the antimesenteric border of the selected point of jejunum.  Enterotomy was made and the jejunostomy tube was advanced distally.  The small balloon is inflated to 4 cc of saline.  The pursestring suture was used to close the jejunum around the tube.  This was pulled up to the anterior abdominal wall.  We made a [facility] tunnel with multiple interrupted 3 oh silks.  3 oh silks were used to attach the segment of small bowel to the anterior abdominal wall.  The tube was irrigated and aspirated.  There is no sign of leak.  We then turned our attention to the gastrostomy.  We used a 87 [place] gastrostomy brought in through a stab incision just below the costal margin on the left side.  We selected a point more proximally on the enlarged fundus of the stomach.  I placed a  pursestring suture and we inserted the gastrostomy tube.  The balloon was inflated and the pursestring suture was tied down.  The stomach was attached to the anterior abdominal wall with multiple interrupted 3-0 silk sutures.  We irrigated the gastrostomy tube and encountered gastric contents.  This aspirated easily.  We placed the gastrostomy tube to straight drain attached to a Foley bag.  We irrigated the abdomen thoroughly.  The fascia was reapproximated  with multiple interrupted #1 Novafil sutures.  The subcutaneous tissues were irrigated and staples were used to close the skin.  The gastrostomy tube and the jejunostomy tube was secured with multiple interrupted 2-0 nylon sutures.  Patient was then extubated and brought to the recovery room in stable condition.  All sponge, instrument, and needle counts are correct.  Imogene Burn. Georgette Dover, MD, [facility] Bee[facility] [phone]  06/28/2018 12:31 PM

## 2018-07-04 NOTE — Progress Notes (Signed)
Subjective/Chief Complaint: Patient cleared by cardiology - "moderate risk" Eliquis held for 48 hours Upper GI - 4 mm pyloric opening Large amount of retained gastric contents  Objective: Vital signs in last 24 hours: Temp:  [98.1 F (36.7 C)-98.6 F (37 C)] 98.1 F (36.7 C) (08/30 0518) Pulse Rate:  [58-59] 59 (08/30 0518) Resp:  [16-18] 16 (08/30 0518) BP: (112-131)/(57-65) 115/59 (08/30 0518) SpO2:  [97 %-98 %] 97 % (08/30 0518) Last BM Date: 07/02/18  Intake/Output from previous day: 08/29 0701 - 08/30 0700 In: 50 [IV Piggyback:50] Out: 800 [Emesis/NG output:800] Intake/Output this shift: No intake/output data recorded.  General appearance: alert, cooperative and no distress GI: mildly distended, non-tender, + BS  Lab Results:  No results for input(s): WBC, HGB, HCT, PLT in the last 72 hours. BMET No results for input(s): NA, K, CL, CO2, GLUCOSE, BUN, CREATININE, CALCIUM in the last 72 hours. PT/INR No results for input(s): LABPROT, INR in the last 72 hours. ABG No results for input(s): PHART, HCO3 in the last 72 hours.  Invalid input(s): PCO2, PO2  Studies/Results: Dg Abd Portable 1v  Result Date: 07/02/2018 CLINICAL DATA:  Insertion of nasogastric tube EXAM: PORTABLE ABDOMEN - 1 VIEW COMPARISON:  Portable exam 1620 hours compared to 06/27/2018 FINDINGS: Nasogastric tube present with tip projecting over mid stomach. Scattered stool throughout colon. No definite bowel wall thickening or obstruction. Bones demineralized. Scattered vascular calcifications. IMPRESSION: Tip of nasogastric tube projects over mid stomach. Electronically Signed   By: Lavonia Dana M.D.   On: 07/02/2018 16:38   Dg Duanne Limerick  W/kub  Result Date: 07/03/2018 CLINICAL DATA:  Gastric outlet obstruction. EXAM: UPPER GI SERIES WITHOUT KUB TECHNIQUE: Routine upper GI series was performed with thin barium. In this setting obstruction barium would not be usually indicated, but the patient is allergic to  iodinated contrast. As much barium as possible was aspirated from the stomach at the termination of the exam (120 cc of barium was used and 450 cc of material was aspirated at the end of the exam). FLUOROSCOPY TIME:  Fluoroscopy Time:  2 minutes 48 seconds Radiation Exposure Index (if provided by the fluoroscopic device): 13.9 mGy Number of Acquired Spot Images: 0 COMPARISON:  Upper GI 12/31/2017.  Abdominal CT from 2 days ago FINDINGS: Despite nasogastric tube and continued wall section the stomach is still distended with gas and fluid and debris. Mucosal exam is not possible. Dilated stomach with pylorus high in the right upper quadrant. There is focal narrowing at the pyloric level with maximal channel opening of 4 mm. This narrowing is fairly smooth. No extraluminal mass was seen on prior CT is well. IMPRESSION: 1. Focal smooth narrowing at the pylorus with channel measuring 4 mm. 2. Despite suction, the stomach remains distended by fluid/debris. Electronically Signed   By: Monte Fantasia M.D.   On: 07/03/2018 16:37    Anti-infectives: Anti-infectives (From admission, onward)   Start     Dose/Rate Route Frequency Ordered Stop   06/30/18 0645  cefTRIAXone (ROCEPHIN) 1 g in sodium chloride 0.9 % 100 mL IVPB  Status:  Discontinued     1 g 200 mL/hr over 30 Minutes Intravenous Every 24 hours 06/30/18 0643 07/02/18 1655   07/01/2018 1500  cefTRIAXone (ROCEPHIN) 1 g in sodium chloride 0.9 % 100 mL IVPB  Status:  Discontinued     1 g 200 mL/hr over 30 Minutes Intravenous Every 24 hours 07/02/2018 1451 06/23/2018 1528      Assessment/Plan: CAD s/p stent  in 2008, not on ASA due to frequent ulcers SVT HTN Atrial fibrillation PUD  Gastroparesis and GOO Rheumatoid arthritis/Spondylolisthesis/Chronic pain syndrome Osteoporosis UTI - on IV rocephin  Constipation   Gastric outlet obstruction secondary to severe pyloric stenosis - EGD with bx 8/13 did not show malignancy, pyloric stenosis most likely due  to PUD - past balloon dilations 01/10/18, 01/24/18, 04/15/18, 06/17/18 - all by Dr. Therisa Doyne - continue NG tube to LIWS, transition all meds to IV  - continue to hold eliquis - Plan open gastrojejunostomy, with gastrostomy and feeding jejunostomy tubes today.  - Plan to transfer patient to either step-down or ICU post-op.    LOS: 4 days    Maia Petties 07/03/2018

## 2018-07-04 NOTE — Anesthesia Postprocedure Evaluation (Signed)
Anesthesia Post Note  Patient: Angela [name]  Procedure(s) Performed: GASTROJEJUNOSTOMY (N/A Abdomen) INSERTION OF GASTROSTOMY TUBE AND JEJUNOSTOMY TUBE (N/A Abdomen)     Patient location during evaluation: PACU Anesthesia Type: General Level of consciousness: awake and alert Pain management: pain level controlled Vital Signs Assessment: post-procedure vital signs reviewed and stable Respiratory status: spontaneous breathing, nonlabored ventilation, respiratory function stable and patient connected to nasal cannula oxygen Cardiovascular status: blood pressure returned to baseline and stable Postop Assessment: no apparent nausea or vomiting Anesthetic complications: no    Last Vitals:  Vitals:   06/05/2018 1420 06/26/2018 1435  BP: (!) 98/57 (!) 100/54  Pulse: 66 76  Resp: 15 15  Temp:  36.8 C  SpO2: 96% 96%    Last Pain:  Vitals:   07/03/2018 1436  TempSrc:   PainSc: Tyler Deis

## 2018-07-04 NOTE — Anesthesia Procedure Notes (Signed)
Procedure Name: Intubation Date/Time: 07/01/2018 10:56 AM Performed by: Candis Shine, CRNA Pre-anesthesia Checklist: Patient identified, Emergency Drugs available, Suction available and Patient being monitored Patient Re-evaluated:Patient Re-evaluated prior to induction Oxygen Delivery Method: Circle System Utilized Preoxygenation: Pre-oxygenation with 100% oxygen Induction Type: IV induction, Rapid sequence and Cricoid Pressure applied Laryngoscope Size: Glidescope and 3 Grade View: Grade II Tube type: Oral Tube size: 7.0 mm Number of attempts: 1 Airway Equipment and Method: Stylet and Video-laryngoscopy Placement Confirmation: ETT inserted through vocal cords under direct vision,  positive ETCO2 and breath sounds checked- equal and bilateral Tube secured with: Tape Dental Injury: Teeth and Oropharynx as per pre-operative assessment  Difficulty Due To: Difficulty was anticipated and Difficult Airway- due to reduced neck mobility

## 2018-07-04 NOTE — Transfer of Care (Signed)
Immediate Anesthesia Transfer of Care Note  Patient: Angela [name]  Procedure(s) Performed: GASTROJEJUNOSTOMY (N/A Abdomen) INSERTION OF GASTROSTOMY TUBE AND JEJUNOSTOMY TUBE (N/A Abdomen)  Patient Location: PACU  Anesthesia Type:General  Level of Consciousness: awake, alert  and oriented  Airway & Oxygen Therapy: Patient Spontanous Breathing and Patient connected to nasal cannula oxygen  Post-op Assessment: Report given to RN and Post -op Vital signs reviewed and stable  Post vital signs: Reviewed and stable  Last Vitals:  Vitals Value Taken Time  BP 102/49 07/05/2018 12:35 PM  Temp    Pulse 63 06/11/2018 12:39 PM  Resp 15 06/21/2018 12:39 PM  SpO2 99 % 06/10/2018 12:39 PM  Vitals shown include unvalidated device data.  Last Pain:  Vitals:   06/16/2018 0945  TempSrc:   PainSc: 0-No pain      Patients Stated Pain Goal: 2 (79/98/72 1587)  Complications: No apparent anesthesia complications

## 2018-07-04 NOTE — Anesthesia Preprocedure Evaluation (Addendum)
Anesthesia Evaluation  Patient identified by MRN, date of birth, ID band Patient awake    Reviewed: Allergy & Precautions, NPO status , Patient's Chart, lab work & pertinent test results  Airway Mallampati: II  TM Distance: <3 FB Neck ROM: Limited    Dental  (+) Dental Advisory Given   Pulmonary asthma ,    breath sounds clear to auscultation       Cardiovascular hypertension, Pt. on medications and Pt. on home beta blockers + CAD and + Cardiac Stents  + dysrhythmias Atrial Fibrillation + Valvular Problems/Murmurs AS  Rhythm:Regular Rate:Normal     Neuro/Psych Depression negative neurological ROS     GI/Hepatic Neg liver ROS, PUD, GERD  ,Gastric outlet obstruction   Endo/Other  negative endocrine ROS  Renal/GU Renal disease     Musculoskeletal  (+) Arthritis , Rheumatoid disorders,    Abdominal   Peds  Hematology  (+) anemia ,   Anesthesia Other Findings   Reproductive/Obstetrics                            Anesthesia Physical Anesthesia Plan  ASA: III  Anesthesia Plan: General   Post-op Pain Management:    Induction: Intravenous, Rapid sequence and Cricoid pressure planned  PONV Risk Score and Plan: 3 and Ondansetron, Dexamethasone and Treatment may vary due to age or medical condition  Airway Management Planned: Oral ETT and Video Laryngoscope Planned  Additional Equipment:   Intra-op Plan:   Post-operative Plan: Extubation in OR and Possible Post-op intubation/ventilation  Informed Consent: I have reviewed the patients History and Physical, chart, labs and discussed the procedure including the risks, benefits and alternatives for the proposed anesthesia with the patient or authorized representative who has indicated his/her understanding and acceptance.   Dental advisory given  Plan Discussed with: CRNA  Anesthesia Plan Comments:        Anesthesia Quick  Evaluation

## 2018-07-04 NOTE — Progress Notes (Addendum)
ANTICOAGULATION CONSULT NOTE - Initial Consult  Pharmacy Consult for heparin Indication: atrial fibrillation  Allergies  Allergen Reactions  . Peanut-Containing Drug Products Anaphylaxis  . Reglan [Metoclopramide] Other (See Comments)    "Messed my mind up"  . Clindamycin/Lincomycin Diarrhea  . Codeine Nausea Only  . Iohexol Swelling and Other (See Comments)     Desc: pt received hypaque 60% in 1994 and had erythema, hives & lips swell.  She did ok w/o premeds in 8/05 at [facility].  Pt. takes daily prednisone for rheumatoid arthritis.  Poor venous access today (7/7/6) so we did her w/o iv contrast.   . Pineapple Swelling  . Shellfish Allergy Swelling and Other (See Comments)    Lips swell    Patient Measurements: Height: 5\' 1"  (154.9 cm) Weight: 106 lb 14.8 oz (48.5 kg) IBW/kg (Calculated) : 47.8 Heparin Dosing Weight: 48 kg  Vital Signs: Temp: 98.2 F (36.8 C) (08/30 1435) Temp Source: Oral (08/30 0518) BP: 100/54 (08/30 1435) Pulse Rate: 76 (08/30 1435)  Labs: No results for input(s): HGB, HCT, PLT, APTT, LABPROT, INR, HEPARINUNFRC, HEPRLOWMOCWT, CREATININE, CKTOTAL, CKMB, TROPONINI in the last 72 hours.  Estimated Creatinine Clearance: 43 mL/min (A) (by C-G formula based on SCr of 0.43 mg/dL (L)).   Medical History: Past Medical History:  Diagnosis Date  . A-fib ([place])   . Acute renal failure ([place]) 06/15/2013  . Aortic stenosis    mild by echo 03/2012  . Asthma   . Atrial fibrillation ([place])   . Atrial flutter ([place]) 01/16/2016  . CAD (coronary artery disease)    s/p PCI with BMS to mid LAD--2/08 not on aspirin due to frequent ulcers.  Repeat cath for CP 09/2012 with patent LAD stent and otherwise normal coronary arteries  . Cardiomyopathy ([place]) 01/30/2016  . Cervical stenosis of spine   . Chronic pain syndrome 12/02/2017  . Depression   . Duodenitis 01/16/2016  . Dyslipidemia   . Elbow fracture, right    history of- never any surgery  . Essential hypertension, benign  11/03/2013  . Gastric outlet obstruction 01/06/2016  . Gastroparesis 01/06/2016  . GERD (gastroesophageal reflux disease)   . Headache   . Hypertension   . Hyponatremia 06/15/2013  . Impaired physical mobility    due to rheumatoid arthritis "ambulates with walker" limited free standing.  . Intestinal polyp   . Macular degeneration   . Obesity   . Osteoporosis    alendronate since 2012  . PUD (peptic ulcer disease)    can not take aspirin  . PVC's (premature ventricular contractions)   . RA (rheumatoid arthritis) ([place])   . Rheumatoid arthritis(714.0)    Dr. Lenna Gilford follows  . Spondylolisthesis of lumbar region   . SVT (supraventricular tachycardia) ([place]) 01/16/2016    Medications:  Scheduled:  . [START ON 07/05/2018] acebutolol  200 mg Per J Tube Daily  . amiodarone  100 mg Per J Tube BID  . calcium-vitamin D  1 tablet Per J Tube BID  . diclofenac sodium  2 g Topical QID  . docusate  100 mg Per J Tube BID  . [START ON 07/05/2018] escitalopram  5 mg Per Tube Daily  . fentaNYL      . [START ON 07/05/2018] fesoterodine  8 mg Oral Daily  . folic acid  1 mg Oral Daily  . hydrocortisone sod succinate (SOLU-CORTEF) inj  50 mg Intravenous Q8H  . LORazepam  2 mg Per Tube QHS  . neomycin-bacitracin-polymyxin  1 application Topical Daily  . [  START ON 07/05/2018] predniSONE  5 mg Per J Tube Q breakfast  . rosuvastatin  10 mg Per J Tube QHS  . sodium chloride flush  3 mL Intravenous Q12H  . [START ON 07/05/2018] vitamin B-12  1,000 mcg Per J Tube Daily  . [START ON 07/05/2018] vitamin C  500 mg Per J Tube Daily   Infusions:  . sodium chloride    . acetaminophen    . albumin human    . dextrose 5 % and 0.9% NaCl 50 mL/hr at 07/02/18 1812  . famotidine (PEPCID) IV 20 mg (06/24/2018 0942)  . lactated ringers 10 mL/hr at 06/19/2018 1016    Assessment: Angela [name] has been on eliquis for her afib. It has been held since 8/29 for a GI procedure. She is s/p gastrostomy tube and jejunostomy tube. Plan is to  resume apixaban eventually but IV heparin will be used to bridge until that point. Heparin will be started 8hr post surgery without bolus. Since she has been on apixaban, we will get PTT at baseline and with heparin level to trend correlation.    Goal of Therapy:  Heparin level 0.3-0.7 units/ml Monitor platelets by anticoagulation protocol: Yes   Plan:   PTT/HL at baseline Heparin at 700 units/hr F/u with 8 hr HL/PTT post start Daily HL/PTT and CBC F/u with resumption of apixaban  Onnie Boer, PharmD, [place], AAHIVP, CPP Infectious Disease Pharmacist Pager: [phone] 06/12/2018 4:38 PM

## 2018-07-04 NOTE — Progress Notes (Signed)
Patient Demographics:    Angela [name], is a 82 y.o. female, DOB - 21-Dec-1935, MVH:[id number]  Admit date - 06/23/2018   Admitting Physician [name], MD  Outpatient Primary MD for the patient is [name], MD  LOS - 4   Chief Complaint  Patient presents with  . Failure To Thrive  . Nausea        Subjective:    Trishna Cwik today has no fevers, blood pressures are soft.,  Will give IV fluids, patient largely asymptomatic, no dizziness no chest pains no palpitations  Assessment  & Plan :    Principal Problem:   Constipation Active Problems:   Cervical spondylosis with myelopathy   Rheumatoid arthritis (HCC)   Hyponatremia   Dyslipidemia   Essential hypertension, benign   Aortic stenosis   CAD (coronary artery disease)   Gastric outlet obstruction   PAF (paroxysmal atrial fibrillation) (HCC)   Cardiomyopathy (HCC)  Brief Narrative:  82 year old with past medical history of severe pyloric stenosis, CAD, atrial fibrillation, hyperlipidemia, osteoporosis, peptic ulcer disease, rheumatoid arthritis came to the hospital with concerns of feeling very nauseous and abdominal discomfort.  In the ER abdominal x-ray showed large stool burden without any other acute pathology.  Sodium was noted to be 128 and potassium 5.9.  She was started on bowel regimen and admitted to the hospital.  With IV fluids her sodium improved to 130 and potassium trended down to 3.9.  UA was suggestive of possible urinary tract infection therefore started on IV Rocephin.  After starting the patient on aggressive bowel regimen she had few bowel movements but her abdomen appears slightly more distended.  CT of the abdomen pelvis showed severe gastric distention due to severe pyloric obstruction, s/p gastrojejunostomy, with gastrostomy andfeedingjejunostomy tubes placement on 07/01/2018   Plan:- 1)Severe Pyloric  Stenosis--- resulting in severe gastric distention and recurrent nausea vomiting, poor oral intake, weight loss and protein caloric malnutrition--- failed multiple attempts at EGD with dilatation, last dilatation was 06/17/2018, GI and surgical consult appreciated , patient will most likely need gastrojejunal bypass most likely with gastrojejunostomy, with possible gastrostomy and jejunostomy tubes, continue NG tube for decompression,  upper GI series on 07/03/2018 shows focal smooth narrowing of the pylorus with stranding measuring 4 mm-by suction the with NG tube in the stomach remains distended by fluid/debris's,  hold Eliquis to allow for procedures .  Preop clearance from cardiology appreciated, they recommend no further cardiovascular testing prior to surgery.  s/p gastrojejunostomy, with gastrostomy andfeedingjejunostomy tubes placement on 06/06/2018,   2) GI motility disorder with constipation--- constipation has resolved with laxatives,   3)PAFib--- s/p DCCV 05/09/2018, remains in sinus rhythm, stable, hold amiodarone 100 mg twice daily as patient is n.p.o., hold Eliquis to allow for procedures, cardiology input/consult appreciated  4)H/o CAD--- Bare metal stent apparently around 2003, no ACS type symptoms at this time, okay to resume acebutolol 200 mg daily and crestor 10 mg when J tube can be used intake resumes, cardiology consult appreciated  5)Rheumatoid Arthritis---  prednisone 5 mg daily on hold as patient is n.p.o, pt received stress dose steroids perioperatively  Disposition/Need for in-Hospital Stay- patient unable to be discharged at this time due to  s/p gastrojejunostomy, with gastrostomy andfeedingjejunostomy tubes  placement on 06/14/2018, need to make sure patient tolerates feeding via J tube prior to discharge, postop blood pressures are soft, IV fluids as ordered   Code Status : full code  Disposition Plan  : TBD  Consults  :  Gi/Gen surg/cardiology for preop  clearance  Procedure-  s/p gastrojejunostomy, with gastrostomy andfeedingjejunostomy tubes placement on 06/13/2018  DVT Prophylaxis  :    SCDs (hold Eliquis to allow for procedures)  Lab Results  Component Value Date   PLT 150 06/05/2018    Inpatient Medications  Scheduled Meds: . [START ON 07/05/2018] acebutolol  200 mg Per J Tube Daily  . amiodarone  100 mg Per J Tube BID  . calcium carbonate (dosed in mg elemental calcium)  1,250 mg Per J Tube BID  . cholecalciferol  200 Units Per Tube BID  . diclofenac sodium  2 g Topical QID  . docusate  100 mg Per J Tube BID  . [START ON 07/05/2018] escitalopram  5 mg Per Tube Daily  . [START ON 07/05/2018] fesoterodine  8 mg Oral Daily  . folic acid  1 mg Oral Daily  . hydrocortisone sod succinate (SOLU-CORTEF) inj  50 mg Intravenous Q8H  . LORazepam  2 mg Per Tube QHS  . neomycin-bacitracin-polymyxin  1 application Topical Daily  . [START ON 07/05/2018] prednisoLONE  5 mg Per Tube QAC breakfast  . rosuvastatin  10 mg Per J Tube QHS  . sodium chloride flush  3 mL Intravenous Q12H  . [START ON 07/05/2018] vitamin B-12  1,000 mcg Per J Tube Daily  . [START ON 07/05/2018] vitamin C  500 mg Per J Tube Daily   Continuous Infusions: . sodium chloride    . acetaminophen    . dextrose 5 % and 0.9% NaCl 50 mL/hr at 07/02/18 1812  . famotidine (PEPCID) IV 20 mg (06/12/2018 0942)  . heparin    . lactated ringers 10 mL/hr at 06/20/2018 1016   PRN Meds:.sodium chloride, bisacodyl, morphine injection, nitroGLYCERIN, [DISCONTINUED] ondansetron **OR** ondansetron (ZOFRAN) IV, phenol, propranolol, sodium chloride flush    Anti-infectives (From admission, onward)   Start     Dose/Rate Route Frequency Ordered Stop   06/15/2018 0915  cefoTEtan (CEFOTAN) 1 g in sodium chloride 0.9 % 100 mL IVPB     1 g 200 mL/hr over 30 Minutes Intravenous To Surgery 06/16/2018 0830 06/19/2018 1132   06/30/18 0645  cefTRIAXone (ROCEPHIN) 1 g in sodium chloride 0.9 % 100 mL IVPB   Status:  Discontinued     1 g 200 mL/hr over 30 Minutes Intravenous Every 24 hours 06/30/18 0643 07/02/18 1655   06/11/2018 1500  cefTRIAXone (ROCEPHIN) 1 g in sodium chloride 0.9 % 100 mL IVPB  Status:  Discontinued     1 g 200 mL/hr over 30 Minutes Intravenous Every 24 hours 06/22/2018 1451 06/16/2018 1528        Objective:   Vitals:   07/03/2018 1350 06/19/2018 1405 06/14/2018 1420 06/11/2018 1435  BP: (!) 93/59 (!) 93/54 (!) 98/57 (!) 100/54  Pulse: 65 65 66 76  Resp: 13 15 15 15   Temp:    98.2 F (36.8 C)  TempSrc:      SpO2: 93% 95% 96% 96%  Weight:      Height:        Wt Readings from Last 3 Encounters:  06/27/2018 48.5 kg  06/17/18 48.5 kg  06/03/18 48.9 kg     Intake/Output Summary (Last 24 hours) at 07/01/2018 1802 Last data  filed at 06/13/2018 1431 Gross per 24 hour  Intake 950 ml  Output 2240 ml  Net -1290 ml     Physical Exam  Gen:- Awake Alert,  In no apparent distress  HEENT:- [place].AT, No sclera icterus Nose- NG tube to intermittent wall suction Neck-Supple Neck,No JVD,.  Lungs-  CTAB , good air movement CV- S1, S2 normal Abd-  +ve B.Sounds, G-tube with scant drainage, J-tube noted  extremity/Skin:-Good pulses, chronic right elbow deformity Psych-affect is appropriate, oriented x3 Neuro-no new focal deficits, no tremors   Data Review:   Micro Results Recent Results (from the past 240 hour(s))  Urine Culture     Status: Abnormal   Collection Time: 06/07/2018  1:47 PM  Result Value Ref Range Status   Specimen Description URINE, RANDOM  Final   Special Requests   Final    NONE Performed at [facility], [place] [address], [place], [place] [address]    Culture MULTIPLE SPECIES PRESENT, SUGGEST RECOLLECTION (A)  Final   Report Status 06/30/2018 FINAL  Final    Radiology Reports Ct Abdomen Pelvis Wo Contrast  Result Date: 07/01/2018 CLINICAL DATA:  Abdominal distention, pain, nausea, history of severe pyloric stenosis EXAM: CT ABDOMEN AND PELVIS WITHOUT  CONTRAST TECHNIQUE: Multidetector CT imaging of the abdomen and pelvis was performed following the standard protocol without IV contrast. COMPARISON:  Chest and abdomen films of 06/09/2017 and CT abdomen pelvis of 01/10/2016 FINDINGS: Lower chest: There are moderate-sized bilateral pleural effusions present with bibasilar linear atelectasis. Moderate cardiomegaly is again noted. No pericardial effusion is seen. Hepatobiliary: The liver is unremarkable in this unenhanced study. Surgical clips are present from prior cholecystectomy. Pancreas: Pancreas appears atrophic. The pancreatic duct is not dilated. Spleen: The spleen is unremarkable with calcified splenic artery noted. Adrenals/Urinary Tract: Adrenal glands appear normal. No renal calculi are seen and there is no evidence of hydronephrosis. The ureters do not appear to be dilated there are difficult to follow in this patient. The urinary bladder is distended with no abnormality evident. Stomach/Bowel: The stomach is massively distended with fluid and food debris as well as oral contrast within this markedly distended stomach. In this patient with history of severe pyloric stenosis, recurrence of severe pyloric stenosis is the primary consideration. No definite mass is seen. No small bowel distention is seen. The mucosa of the ano-rectal region is somewhat thickened of questionable significance. Clinical correlation is recommended. The majority of the colon is decompressed and very difficult to assess. The terminal ileum is poorly visualized but the appendix does appear to fill with air and is unremarkable. Vascular/Lymphatic: Moderate abdominal aortic atherosclerosis is present. No aneurysm is noted. There is no evidence of adenopathy. Reproductive: The uterus is somewhat atrophic. No adnexal lesion is seen. No fluid is noted within the pelvis. Other: No abdominal wall hernia is seen. Musculoskeletal: There is anterolisthesis of L4 on L5 by 5 mm and  anterolisthesis of L3 on L4 by 8 mm. Both of these malalignments appear to be due to degenerative change of the facet joints. There is considerable degenerative disc disease at both L3-4 and L4-5 levels. No compression deformity is seen. There is mild degenerative joint disease of the hips present. IMPRESSION: 1. Massively distended stomach with food and fluid present. This most likely is due to recurrence of severe pyloric stenosis by history. 2. Circumferential thickening of the a no rectal mucosa of questionable significance. Correlate clinically. 3. Degenerative disc disease at L3-4 and L4-5 as noted above. 4.  Moderate-sized bilateral pleural effusions with bibasilar atelectasis. Cardiomegaly. Electronically Signed   By: Ivar Drape M.D.   On: 07/01/2018 13:28   Dg Abd Acute W/chest  Result Date: 06/15/2018 CLINICAL DATA:  Nausea. EXAM: DG ABDOMEN ACUTE W/ 1V CHEST COMPARISON:  Radiographs of May 14, 2018. FINDINGS: Large amount of stool seen throughout the colon. No abnormal bowel dilatation is noted. No radiopaque calculi or other significant radiographic abnormality is seen. Stable cardiomegaly. Status post cholecystectomy. Atherosclerosis of thoracic aorta is noted. Both lungs are clear. IMPRESSION: Large stool burden is noted. No abnormal bowel dilatation is noted. No acute cardiopulmonary disease. Aortic Atherosclerosis (ICD10-I70.0). Electronically Signed   By: Marijo Conception, M.D.   On: 06/24/2018 13:23   Dg Abd Portable 1v  Result Date: 07/02/2018 CLINICAL DATA:  Insertion of nasogastric tube EXAM: PORTABLE ABDOMEN - 1 VIEW COMPARISON:  Portable exam 1620 hours compared to 07/04/2018 FINDINGS: Nasogastric tube present with tip projecting over mid stomach. Scattered stool throughout colon. No definite bowel wall thickening or obstruction. Bones demineralized. Scattered vascular calcifications. IMPRESSION: Tip of nasogastric tube projects over mid stomach. Electronically Signed   By: Lavonia Dana  M.D.   On: 07/02/2018 16:38   Dg Duanne Limerick  W/kub  Result Date: 07/03/2018 CLINICAL DATA:  Gastric outlet obstruction. EXAM: UPPER GI SERIES WITHOUT KUB TECHNIQUE: Routine upper GI series was performed with thin barium. In this setting obstruction barium would not be usually indicated, but the patient is allergic to iodinated contrast. As much barium as possible was aspirated from the stomach at the termination of the exam (120 cc of barium was used and 450 cc of material was aspirated at the end of the exam). FLUOROSCOPY TIME:  Fluoroscopy Time:  2 minutes 48 seconds Radiation Exposure Index (if provided by the fluoroscopic device): 13.9 mGy Number of Acquired Spot Images: 0 COMPARISON:  Upper GI 12/31/2017.  Abdominal CT from 2 days ago FINDINGS: Despite nasogastric tube and continued wall section the stomach is still distended with gas and fluid and debris. Mucosal exam is not possible. Dilated stomach with pylorus high in the right upper quadrant. There is focal narrowing at the pyloric level with maximal channel opening of 4 mm. This narrowing is fairly smooth. No extraluminal mass was seen on prior CT is well. IMPRESSION: 1. Focal smooth narrowing at the pylorus with channel measuring 4 mm. 2. Despite suction, the stomach remains distended by fluid/debris. Electronically Signed   By: Monte Fantasia M.D.   On: 07/03/2018 16:37     CBC Recent Labs  Lab 06/11/2018 1148  WBC 5.5  HGB 11.8*  HCT 34.9*  PLT 150  MCV 108.7*  MCH 36.8*  MCHC 33.8  RDW 13.3    Chemistries  Recent Labs  Lab 06/20/2018 1148 06/30/18 0500  NA 128* 130*  K 5.9* 3.9  CL 95* 100  CO2 24 23  GLUCOSE 105* 104*  BUN 12 9  CREATININE 0.61 0.43*  CALCIUM 8.4* 7.7*  AST 65*  --   ALT 32  --   ALKPHOS 28*  --   BILITOT 2.2*  --    ------------------------------------------------------------------------------------------------------------------ No results for input(s): CHOL, HDL, LDLCALC, TRIG, CHOLHDL, LDLDIRECT in  the last 72 hours.  Lab Results  Component Value Date   HGBA1C  08/06/2009    5.2 (NOTE) The ADA recommends the following therapeutic goal for glycemic control related to Hgb A1c measurement: Goal of therapy: <6.5 Hgb A1c  Reference: American Diabetes Association: Clinical Practice Recommendations 2010,  Diabetes Care, 2010, 33: (Suppl  1).   ------------------------------------------------------------------------------------------------------------------ No results for input(s): TSH, T4TOTAL, T3FREE, THYROIDAB in the last 72 hours.  Invalid input(s): FREET3 ------------------------------------------------------------------------------------------------------------------ No results for input(s): VITAMINB12, FOLATE, FERRITIN, TIBC, IRON, RETICCTPCT in the last 72 hours.  Coagulation profile No results for input(s): INR, PROTIME in the last 168 hours.  No results for input(s): DDIMER in the last 72 hours.  Cardiac Enzymes No results for input(s): CKMB, TROPONINI, MYOGLOBIN in the last 168 hours.  Invalid input(s): CK ------------------------------------------------------------------------------------------------------------------    Component Value Date/Time   BNP 127.6 (H) 06/10/2018 1128    Roxan Hockey M.D on 06/19/2018 at 6:02 PM  Pager---[phone] Go to [email or web address] - password [email or web address] for contact info  [facility] - Office  [phone]

## 2018-07-04 NOTE — Progress Notes (Signed)
Patient undergoing gastrojejunostomy, with gastrostomy and feeding jejunostomy tubes placement today. GI will follow up tomorrow.  Otis Brace MD, [place] 06/07/2018, 11:42 AM  Contact #  [phone]

## 2018-07-05 ENCOUNTER — Encounter (HOSPITAL_COMMUNITY): Payer: Self-pay | Admitting: Surgery

## 2018-07-05 DIAGNOSIS — I2510 Atherosclerotic heart disease of native coronary artery without angina pectoris: Secondary | ICD-10-CM

## 2018-07-05 DIAGNOSIS — D62 Acute posthemorrhagic anemia: Secondary | ICD-10-CM | POA: Diagnosis not present

## 2018-07-05 DIAGNOSIS — E440 Moderate protein-calorie malnutrition: Secondary | ICD-10-CM | POA: Diagnosis present

## 2018-07-05 LAB — CBC
HEMATOCRIT: 22.3 % — AB (ref 36.0–46.0)
Hemoglobin: 7.6 g/dL — ABNORMAL LOW (ref 12.0–15.0)
MCH: 37.3 pg — ABNORMAL HIGH (ref 26.0–34.0)
MCHC: 34.1 g/dL (ref 30.0–36.0)
MCV: 109.3 fL — AB (ref 78.0–100.0)
Platelets: 182 10*3/uL (ref 150–400)
RBC: 2.04 MIL/uL — AB (ref 3.87–5.11)
RDW: 13.2 % (ref 11.5–15.5)
WBC: 10.7 10*3/uL — ABNORMAL HIGH (ref 4.0–10.5)

## 2018-07-05 LAB — APTT: aPTT: 78 seconds — ABNORMAL HIGH (ref 24–36)

## 2018-07-05 LAB — BASIC METABOLIC PANEL
Anion gap: 10 (ref 5–15)
BUN: 16 mg/dL (ref 8–23)
CHLORIDE: 103 mmol/L (ref 98–111)
CO2: 20 mmol/L — ABNORMAL LOW (ref 22–32)
Calcium: 7.6 mg/dL — ABNORMAL LOW (ref 8.9–10.3)
Creatinine, Ser: 0.86 mg/dL (ref 0.44–1.00)
GFR calc Af Amer: >60 mL/min (ref >60)
GFR calc non Af Amer: >60 mL/min (ref >60)
Glucose, Bld: 291 mg/dL — ABNORMAL HIGH (ref 70–99)
POTASSIUM: 3.4 mmol/L — AB (ref 3.5–5.1)
SODIUM: 133 mmol/L — AB (ref 135–145)

## 2018-07-05 LAB — GLUCOSE, CAPILLARY
GLUCOSE-CAPILLARY: 179 mg/dL — AB (ref 70–99)
Glucose-Capillary: 157 mg/dL — ABNORMAL HIGH (ref 70–99)

## 2018-07-05 LAB — PREPARE RBC (CROSSMATCH)

## 2018-07-05 LAB — ABO/RH: ABO/RH(D): O POS

## 2018-07-05 LAB — HEPARIN LEVEL (UNFRACTIONATED)

## 2018-07-05 MED ORDER — PREDNISOLONE SODIUM PHOSPHATE 15 MG/5ML PO SOLN
10.0000 mg | Freq: Every day | ORAL | Status: DC
  Administered 2018-07-06 – 2018-07-10 (×4): 10 mg
  Filled 2018-07-05 (×8): qty 5

## 2018-07-05 MED ORDER — SODIUM CHLORIDE 0.9% IV SOLUTION: Freq: Once | INTRAVENOUS | Status: DC

## 2018-07-05 MED ORDER — SODIUM CHLORIDE 0.9 % IV BOLUS
250.0000 mL | Freq: Once | INTRAVENOUS | Status: AC
  Administered 2018-07-05: 250 mL via INTRAVENOUS

## 2018-07-05 MED ORDER — FUROSEMIDE 10 MG/ML IJ SOLN
40.0000 mg | Freq: Once | INTRAMUSCULAR | Status: AC
  Administered 2018-07-05: 40 mg via INTRAVENOUS
  Filled 2018-07-05: qty 4

## 2018-07-05 MED ORDER — JEVITY 1.2 CAL PO LIQD
1000.0000 mL | ORAL | Status: DC
  Administered 2018-07-05 – 2018-07-06 (×2): 1000 mL
  Filled 2018-07-05 (×6): qty 1000

## 2018-07-05 MED ORDER — HYDROCORTISONE NA SUCCINATE PF 100 MG IJ SOLR
50.0000 mg | Freq: Once | INTRAMUSCULAR | Status: AC
  Administered 2018-07-05: 50 mg via INTRAVENOUS
  Filled 2018-07-05: qty 2

## 2018-07-05 MED ORDER — PREDNISONE 10 MG PO TABS: 10.0000 mg | ORAL_TABLET | Freq: Every morning | ORAL | Status: DC

## 2018-07-05 MED ORDER — ACETAMINOPHEN 160 MG/5ML PO SOLN
650.0000 mg | Freq: Four times a day (QID) | ORAL | Status: DC | PRN
  Administered 2018-07-06 – 2018-07-09 (×5): 650 mg
  Filled 2018-07-05 (×6): qty 20.3

## 2018-07-05 MED ORDER — JEVITY 1.2 CAL PO LIQD
1000.0000 mL | ORAL | Status: DC
  Filled 2018-07-05: qty 1000

## 2018-07-05 MED ORDER — SODIUM CHLORIDE 0.9 % IV SOLN
INTRAVENOUS | Status: DC
  Administered 2018-07-05: 11:00:00 via INTRAVENOUS

## 2018-07-05 NOTE — Progress Notes (Signed)
Patient Demographics:    Angela [name], is a 82 y.o. female, DOB - [birth year], HTD:[id number]  Admit date - 06/09/2018   Admitting Physician [name], MD  Outpatient Primary MD for the patient is [name], MD  LOS - 5   Chief Complaint  Patient presents with  . Failure To Thrive  . Nausea        Subjective:    [name] today has no fevers, around the episode of low BP, with systolic blood pressure in the 60s and 70s, give hydrocortisone 50 mg x 1, repeat CBC shows hemoglobin down to 7.6 from baseline around 12, patient brother at bedside, questions answered, no chest pains, she did have a near syncopal episode while they were trying to get into a chair  Assessment  & Plan :    Principal Problem:   Gastric outlet obstruction Active Problems:   Acute blood loss anemia   Cervical spondylosis with myelopathy   Rheumatoid arthritis (HCC)   Hyponatremia   Dyslipidemia   Essential hypertension, benign   Aortic stenosis   CAD (coronary artery disease)   Constipation   PAF (paroxysmal atrial fibrillation) (HCC)   Cardiomyopathy (HCC)   Malnutrition of moderate degree  Brief  Summary:  82 year old with past medical history of severe pyloric stenosis, CAD, atrial fibrillation, hyperlipidemia, osteoporosis, peptic ulcer disease, rheumatoid arthritis came to the hospital with concerns of feeling very nauseous and abdominal discomfort.  In the ER abdominal x-ray showed large stool burden without any other acute pathology.  Sodium was noted to be 128 and potassium 5.9.  She was started on bowel regimen and admitted to the hospital.  With IV fluids her sodium improved to 130 and potassium trended down to 3.9.  UA was suggestive of possible urinary tract infection therefore started on IV Rocephin.  After starting the patient on aggressive bowel regimen she had few bowel movements but her abdomen  appears slightly more distended.  CT of the abdomen pelvis showed severe gastric distention due to severe pyloric obstruction, s/p gastrojejunostomy, with gastrostomy andfeedingjejunostomy tubes placement on 06/26/2018   Plan:- 1)Severe Pyloric Stenosis--- resulting in severe gastric distention and recurrent nausea vomiting, poor oral intake, weight loss and protein caloric malnutrition--- failed multiple attempts at EGD with dilatation, last dilatation was 06/17/2018, GI and surgical consult appreciated ,  On 06/11/2018 she underwent gastrojejunal bypass  with gastrojejunostomy, with  Gastrostomy tube for drainage and jejunostomy tube for feeding,    NG tube d/ced 07/05/18,  upper GI series on 07/03/2018 shows focal smooth narrowing of the pylorus with stranding measuring 4 mm-by suction the with NG tube in the stomach remained distended by fluid/debris's,    Preop clearance from cardiology appreciated, they recommend no further cardiovascular testing prior to surgery.      2)Moderate protein caloric malnutrition-  start Jevity 1.2 formula @ 20 ml/hr via J-tube and increase by 10 ml every 8 hours to goal rate of 55 ml/hr.   3)PAFib--- s/p DCCV 05/09/2018, remains in sinus rhythm, stable, c/n amiodarone 100 mg twice daily , c/n acebutolol 200 mg daily,  hold Eliquis due to concerns about possible postop bleeding, cardiology input/consult appreciated, may use propranolol as needed palpitations  4)H/o CAD--- Bare metal stent apparently  around 2003, no ACS type symptoms at this time, okay to resume acebutolol 200 mg daily and crestor 10 mg via J tube  , cardiology consult appreciated  5)Rheumatoid Arthritis---  PTA was prednisone 5 mg daily ,  received stress dose steroids perioperatively, changed to Orapred 10 mg daily and then discharged on 5 mg when ready for discharge  6) acute blood loss anemia--- baseline hemoglobin usually around 12, patient had a near syncopal episode today with systolic blood pressure  down to the 60s and 70s she was pale very dizzy and kind of went out, patient was very symptomatic anemia with hemoglobin of 7.6 from a baseline of 12, transfuse 2 units of packed cells for symptomatic anemia. Risk, benefits and alternatives to transfusion of blood products discussed. Indication for transfusion discussed. Consent obtained    Disposition/Need for in-Hospital Stay- patient unable to be discharged at this time due to symptomatic anemia with near syncope requiring transfusion of packed red blood cells with hemoglobin dropping from baseline of 12-7.6, and also s/p gastrojejunostomy, with gastrostomy andfeedingjejunostomy tubes placement on 06/14/2018, need to make sure patient tolerates feeding via J tube prior to discharge, postop blood pressures are soft,  Code Status : full code  Disposition Plan  : TBD  Consults  :  Gi/Gen surg/cardiology for preop clearance  Procedure-  s/p gastrojejunostomy, with gastrostomy andfeedingjejunostomy tubes placement on 06/07/2018  DVT Prophylaxis  :    SCDs (hold Eliquis to allow for procedures)  Lab Results  Component Value Date   PLT 182 07/05/2018    Inpatient Medications  Scheduled Meds: . sodium chloride   Intravenous Once  . acebutolol  200 mg Per J Tube Daily  . amiodarone  100 mg Per J Tube BID  . calcium carbonate (dosed in mg elemental calcium)  1,250 mg Per J Tube BID  . cholecalciferol  200 Units Per Tube BID  . diclofenac sodium  2 g Topical QID  . docusate  100 mg Per J Tube BID  . escitalopram  5 mg Per Tube Daily  . fesoterodine  8 mg Oral Daily  . folic acid  1 mg Oral Daily  . furosemide  40 mg Intravenous Once  . LORazepam  1 mg Intravenous Once  . LORazepam  2 mg Per Tube QHS  . neomycin-bacitracin-polymyxin  1 application Topical Daily  . [START ON 07/06/2018] prednisoLONE  10 mg Per Tube QAC breakfast  . rosuvastatin  10 mg Per J Tube QHS  . sodium chloride flush  3 mL Intravenous Q12H  . vitamin B-12  1,000  mcg Per J Tube Daily  . vitamin C  500 mg Per J Tube Daily   Continuous Infusions: . sodium chloride    . famotidine (PEPCID) IV 20 mg (07/05/18 1040)  . feeding supplement (JEVITY 1.2 CAL) 1,000 mL (07/05/18 1356)  . lactated ringers 10 mL/hr at 06/24/2018 1016   PRN Meds:.sodium chloride, bisacodyl, morphine injection, nitroGLYCERIN, [DISCONTINUED] ondansetron **OR** ondansetron (ZOFRAN) IV, phenol, propranolol, sodium chloride flush    Anti-infectives (From admission, onward)   Start     Dose/Rate Route Frequency Ordered Stop   06/22/2018 0915  cefoTEtan (CEFOTAN) 1 g in sodium chloride 0.9 % 100 mL IVPB     1 g 200 mL/hr over 30 Minutes Intravenous To Surgery 06/11/2018 0830 06/07/2018 1132   06/30/18 0645  cefTRIAXone (ROCEPHIN) 1 g in sodium chloride 0.9 % 100 mL IVPB  Status:  Discontinued     1 g 200 mL/hr  over 30 Minutes Intravenous Every 24 hours 06/30/18 0643 07/02/18 1655   06/26/2018 1500  cefTRIAXone (ROCEPHIN) 1 g in sodium chloride 0.9 % 100 mL IVPB  Status:  Discontinued     1 g 200 mL/hr over 30 Minutes Intravenous Every 24 hours 06/16/2018 1451 06/14/2018 1528        Objective:   Vitals:   07/05/18 1238 07/05/18 1424 07/05/18 1500 07/05/18 1501  BP:  (!) 102/54  (!) 97/49  Pulse:  60 (!) 59 (!) 59  Resp:  18 (!) 21 (!) 21  Temp: 98.4 F (36.9 C) 98.6 F (37 C)  98.9 F (37.2 C)  TempSrc: Oral   Oral  SpO2:    99%  Weight:      Height:        Wt Readings from Last 3 Encounters:  07/05/2018 48.5 kg  06/17/18 48.5 kg  06/03/18 48.9 kg     Intake/Output Summary (Last 24 hours) at 07/05/2018 1637 Last data filed at 07/05/2018 1444 Gross per 24 hour  Intake 1024.62 ml  Output 1400 ml  Net -375.38 ml     Physical Exam  Gen:- Awake Alert,  In no apparent distress  HEENT:- [place].AT, No sclera icterus Neck-Supple Neck,No JVD,.  Lungs-  CTAB , good air movement CV- S1, S2 normal Abd-  +ve B.Sounds, G-tube with scant drainage, J-tube noted, abdominal wall postop  drainage with serosanguineous discharge, postop tenderness noted extremity/Skin:-Good pulses, chronic right elbow deformity, pale Psych-affect is appropriate, oriented x3 Neuro-no new focal deficits, no tremors   Data Review:   Micro Results Recent Results (from the past 240 hour(s))  Urine Culture     Status: Abnormal   Collection Time: 06/06/2018  1:47 PM  Result Value Ref Range Status   Specimen Description URINE, RANDOM  Final   Special Requests   Final    NONE Performed at [facility], [place] [address], [place], [place] [address]    Culture MULTIPLE SPECIES PRESENT, SUGGEST RECOLLECTION (A)  Final   Report Status 06/30/2018 FINAL  Final    Radiology Reports Ct Abdomen Pelvis Wo Contrast  Result Date: 07/01/2018 CLINICAL DATA:  Abdominal distention, pain, nausea, history of severe pyloric stenosis EXAM: CT ABDOMEN AND PELVIS WITHOUT CONTRAST TECHNIQUE: Multidetector CT imaging of the abdomen and pelvis was performed following the standard protocol without IV contrast. COMPARISON:  Chest and abdomen films of 06/09/2017 and CT abdomen pelvis of 01/10/2016 FINDINGS: Lower chest: There are moderate-sized bilateral pleural effusions present with bibasilar linear atelectasis. Moderate cardiomegaly is again noted. No pericardial effusion is seen. Hepatobiliary: The liver is unremarkable in this unenhanced study. Surgical clips are present from prior cholecystectomy. Pancreas: Pancreas appears atrophic. The pancreatic duct is not dilated. Spleen: The spleen is unremarkable with calcified splenic artery noted. Adrenals/Urinary Tract: Adrenal glands appear normal. No renal calculi are seen and there is no evidence of hydronephrosis. The ureters do not appear to be dilated there are difficult to follow in this patient. The urinary bladder is distended with no abnormality evident. Stomach/Bowel: The stomach is massively distended with fluid and food debris as well as oral contrast within this  markedly distended stomach. In this patient with history of severe pyloric stenosis, recurrence of severe pyloric stenosis is the primary consideration. No definite mass is seen. No small bowel distention is seen. The mucosa of the ano-rectal region is somewhat thickened of questionable significance. Clinical correlation is recommended. The majority of the colon is decompressed and very difficult  to assess. The terminal ileum is poorly visualized but the appendix does appear to fill with air and is unremarkable. Vascular/Lymphatic: Moderate abdominal aortic atherosclerosis is present. No aneurysm is noted. There is no evidence of adenopathy. Reproductive: The uterus is somewhat atrophic. No adnexal lesion is seen. No fluid is noted within the pelvis. Other: No abdominal wall hernia is seen. Musculoskeletal: There is anterolisthesis of L4 on L5 by 5 mm and anterolisthesis of L3 on L4 by 8 mm. Both of these malalignments appear to be due to degenerative change of the facet joints. There is considerable degenerative disc disease at both L3-4 and L4-5 levels. No compression deformity is seen. There is mild degenerative joint disease of the hips present. IMPRESSION: 1. Massively distended stomach with food and fluid present. This most likely is due to recurrence of severe pyloric stenosis by history. 2. Circumferential thickening of the a no rectal mucosa of questionable significance. Correlate clinically. 3. Degenerative disc disease at L3-4 and L4-5 as noted above. 4. Moderate-sized bilateral pleural effusions with bibasilar atelectasis. Cardiomegaly. Electronically Signed   By: Ivar Drape M.D.   On: 07/01/2018 13:28   Dg Abd Acute W/chest  Result Date: 06/28/2018 CLINICAL DATA:  Nausea. EXAM: DG ABDOMEN ACUTE W/ 1V CHEST COMPARISON:  Radiographs of May 14, 2018. FINDINGS: Large amount of stool seen throughout the colon. No abnormal bowel dilatation is noted. No radiopaque calculi or other significant  radiographic abnormality is seen. Stable cardiomegaly. Status post cholecystectomy. Atherosclerosis of thoracic aorta is noted. Both lungs are clear. IMPRESSION: Large stool burden is noted. No abnormal bowel dilatation is noted. No acute cardiopulmonary disease. Aortic Atherosclerosis (ICD10-I70.0). Electronically Signed   By: Marijo Conception, M.D.   On: 06/16/2018 13:23   Dg Abd Portable 1v  Result Date: 07/02/2018 CLINICAL DATA:  Insertion of nasogastric tube EXAM: PORTABLE ABDOMEN - 1 VIEW COMPARISON:  Portable exam 1620 hours compared to 06/06/2018 FINDINGS: Nasogastric tube present with tip projecting over mid stomach. Scattered stool throughout colon. No definite bowel wall thickening or obstruction. Bones demineralized. Scattered vascular calcifications. IMPRESSION: Tip of nasogastric tube projects over mid stomach. Electronically Signed   By: Lavonia Dana M.D.   On: 07/02/2018 16:38   Dg Duanne Limerick  W/kub  Result Date: 07/03/2018 CLINICAL DATA:  Gastric outlet obstruction. EXAM: UPPER GI SERIES WITHOUT KUB TECHNIQUE: Routine upper GI series was performed with thin barium. In this setting obstruction barium would not be usually indicated, but the patient is allergic to iodinated contrast. As much barium as possible was aspirated from the stomach at the termination of the exam (120 cc of barium was used and 450 cc of material was aspirated at the end of the exam). FLUOROSCOPY TIME:  Fluoroscopy Time:  2 minutes 48 seconds Radiation Exposure Index (if provided by the fluoroscopic device): 13.9 mGy Number of Acquired Spot Images: 0 COMPARISON:  Upper GI 12/31/2017.  Abdominal CT from 2 days ago FINDINGS: Despite nasogastric tube and continued wall section the stomach is still distended with gas and fluid and debris. Mucosal exam is not possible. Dilated stomach with pylorus high in the right upper quadrant. There is focal narrowing at the pyloric level with maximal channel opening of 4 mm. This narrowing is  fairly smooth. No extraluminal mass was seen on prior CT is well. IMPRESSION: 1. Focal smooth narrowing at the pylorus with channel measuring 4 mm. 2. Despite suction, the stomach remains distended by fluid/debris. Electronically Signed   By: Neva Seat.D.  On: 07/03/2018 16:37     CBC Recent Labs  Lab 06/18/2018 1148 07/05/18 1105  WBC 5.5 10.7*  HGB 11.8* 7.6*  HCT 34.9* 22.3*  PLT 150 182  MCV 108.7* 109.3*  MCH 36.8* 37.3*  MCHC 33.8 34.1  RDW 13.3 13.2    Chemistries  Recent Labs  Lab 07/02/2018 1148 06/30/18 0500 07/05/18 1105  NA 128* 130* 133*  K 5.9* 3.9 3.4*  CL 95* 100 103  CO2 24 23 20*  GLUCOSE 105* 104* 291*  BUN 12 9 16   CREATININE 0.61 0.43* 0.86  CALCIUM 8.4* 7.7* 7.6*  AST 65*  --   --   ALT 32  --   --   ALKPHOS 28*  --   --   BILITOT 2.2*  --   --    ------------------------------------------------------------------------------------------------------------------ No results for input(s): CHOL, HDL, LDLCALC, TRIG, CHOLHDL, LDLDIRECT in the last 72 hours.  Lab Results  Component Value Date   HGBA1C  08/06/2009    5.2 (NOTE) The ADA recommends the following therapeutic goal for glycemic control related to Hgb A1c measurement: Goal of therapy: <6.5 Hgb A1c  Reference: American Diabetes Association: Clinical Practice Recommendations 2010, Diabetes Care, 2010, 33: (Suppl  1).   ------------------------------------------------------------------------------------------------------------------ No results for input(s): TSH, T4TOTAL, T3FREE, THYROIDAB in the last 72 hours.  Invalid input(s): FREET3 ------------------------------------------------------------------------------------------------------------------ No results for input(s): VITAMINB12, FOLATE, FERRITIN, TIBC, IRON, RETICCTPCT in the last 72 hours.  Coagulation profile No results for input(s): INR, PROTIME in the last 168 hours.  No results for input(s): DDIMER in the last 72  hours.  Cardiac Enzymes No results for input(s): CKMB, TROPONINI, MYOGLOBIN in the last 168 hours.  Invalid input(s): CK ------------------------------------------------------------------------------------------------------------------    Component Value Date/Time   BNP 127.6 (H) 06/10/2018 1128   Roxan Hockey M.D on 07/05/2018 at 4:37 PM  Pager---[phone] Go to [email or web address] - password [email or web address] for contact info  [facility] - Office  [phone]

## 2018-07-05 NOTE — Progress Notes (Signed)
1 Day Post-Op   Subjective/Chief Complaint: Patient awake, alert Complaining of some tenderness at incision Gastrostomy tube draining well   Objective: Vital signs in last 24 hours: Temp:  [98 F (36.7 C)-98.9 F (37.2 C)] 98.9 F (37.2 C) (08/31 0830) Pulse Rate:  [62-76] 62 (08/31 0236) Resp:  [12-21] 16 (08/31 0236) BP: (81-103)/(48-61) 89/51 (08/31 0236) SpO2:  [93 %-99 %] 96 % (08/31 0236) Weight:  [48.5 kg] 48.5 kg (08/30 1013) Last BM Date: 07/02/18  Intake/Output from previous day: 08/30 0701 - 08/31 0700 In: 1894.6 [I.V.:1679.6; IV Piggyback:215] Out: 2040 [Urine:615; Emesis/NG output:1150; Drains:75; Blood:50] Intake/Output this shift: No intake/output data recorded.  General appearance: alert, cooperative and no distress Resp: clear to auscultation bilaterally Cardio: regular rate and rhythm, S1, S2 normal, no murmur, click, rub or gallop GI: soft, tender in upper midline; G-tube with gastric contents; J-tube clamped Incision c/d/i through honeycomb dressing  Lab Results:  No results for input(s): WBC, HGB, HCT, PLT in the last 72 hours. BMET No results for input(s): NA, K, CL, CO2, GLUCOSE, BUN, CREATININE, CALCIUM in the last 72 hours. PT/INR No results for input(s): LABPROT, INR in the last 72 hours. ABG No results for input(s): PHART, HCO3 in the last 72 hours.  Invalid input(s): PCO2, PO2  Studies/Results: Dg Ugi  W/kub  Result Date: 07/03/2018 CLINICAL DATA:  Gastric outlet obstruction. EXAM: UPPER GI SERIES WITHOUT KUB TECHNIQUE: Routine upper GI series was performed with thin barium. In this setting obstruction barium would not be usually indicated, but the patient is allergic to iodinated contrast. As much barium as possible was aspirated from the stomach at the termination of the exam (120 cc of barium was used and 450 cc of material was aspirated at the end of the exam). FLUOROSCOPY TIME:  Fluoroscopy Time:  2 minutes 48 seconds Radiation Exposure  Index (if provided by the fluoroscopic device): 13.9 mGy Number of Acquired Spot Images: 0 COMPARISON:  Upper GI 12/31/2017.  Abdominal CT from 2 days ago FINDINGS: Despite nasogastric tube and continued wall section the stomach is still distended with gas and fluid and debris. Mucosal exam is not possible. Dilated stomach with pylorus high in the right upper quadrant. There is focal narrowing at the pyloric level with maximal channel opening of 4 mm. This narrowing is fairly smooth. No extraluminal mass was seen on prior CT is well. IMPRESSION: 1. Focal smooth narrowing at the pylorus with channel measuring 4 mm. 2. Despite suction, the stomach remains distended by fluid/debris. Electronically Signed   By: Monte Fantasia M.D.   On: 07/03/2018 16:37    Anti-infectives: Anti-infectives (From admission, onward)   Start     Dose/Rate Route Frequency Ordered Stop   06/17/2018 0915  cefoTEtan (CEFOTAN) 1 g in sodium chloride 0.9 % 100 mL IVPB     1 g 200 mL/hr over 30 Minutes Intravenous To Surgery 06/25/2018 0830 07/01/2018 1132   06/30/18 0645  cefTRIAXone (ROCEPHIN) 1 g in sodium chloride 0.9 % 100 mL IVPB  Status:  Discontinued     1 g 200 mL/hr over 30 Minutes Intravenous Every 24 hours 06/30/18 0643 07/02/18 1655   06/28/2018 1500  cefTRIAXone (ROCEPHIN) 1 g in sodium chloride 0.9 % 100 mL IVPB  Status:  Discontinued     1 g 200 mL/hr over 30 Minutes Intravenous Every 24 hours 06/27/2018 1451 06/23/2018 1528      Assessment/Plan: CAD s/p stent in 2008, not on ASA due to frequent ulcers SVT HTN  Atrial fibrillation PUD  Gastroparesis and GOO Rheumatoid arthritis/Spondylolisthesis/Chronic pain syndrome Osteoporosis UTI - on IV rocephin  Constipation   Gastric outlet obstruction secondary to severe pyloric stenosis - EGD with bx 8/13 did not show malignancy, pyloric stenosis most likely due to PUD - past balloon dilations 01/10/18, 01/24/18, 04/15/18, 06/17/18 - all by Dr. Therisa Doyne -continue NG tube to  LIWS, transition all meds to IV -continue to holdeliquis - may restart 07/06/18 -Opengastrojejunostomy, with gastrostomy and feeding jejunostomy tubes - 06/25/2018 - Dr. Georgette Dover  D/C FOley D/C NG tube - leave gastrostomy to straight drain Ice chips Begin jejunostomy tube feeds today - may use for meds if finely crushed or liquid - cannot allow j-tube to become clogged OOB to chair    LOS: 5 days    Angela [name] 07/05/2018

## 2018-07-05 NOTE — Progress Notes (Signed)
Subjective: Patient was seen and examined at bedside. NG tube to suction with around 400 mL of bilious drainage. Gastrostomy tube placed to drain, jejunostomy tube placed for feeding purposes. Patient complains of some tenderness at the surgical site and bloating in upper abdomen. She reports passage of flatus and is on ice chips.  Objective: Vital signs in last 24 hours: Temp:  [98 F (36.7 C)-98.9 F (37.2 C)] 98.9 F (37.2 C) (08/31 0830) Pulse Rate:  [62-76] 62 (08/31 0236) Resp:  [12-21] 16 (08/31 0236) BP: (81-103)/(48-61) 89/51 (08/31 0236) SpO2:  [93 %-99 %] 96 % (08/31 0236) Weight:  [48.5 kg] 48.5 kg (08/30 1013) Weight change:  Last BM Date: 07/02/18  KK:XFGHW, thin, elderly, nontoxic GENERAL:mild pallor, no icterus ABDOMEN:nondistended, mild tenderness, gastrostomy tube placed to drain, jejunostomy tube in, normal bowel sounds EXTREMITIES:no edema  Lab Results: Results for orders placed or performed during the hospital encounter of 06/20/2018 (from the past 48 hour(s))  APTT     Status: None   Collection Time: 06/21/2018  4:52 PM  Result Value Ref Range   aPTT 25 24 - 36 seconds    Comment: Performed at [facility], [address], [place], [place] [address]  Heparin level (unfractionated)     Status: Abnormal   Collection Time: 06/17/2018  4:52 PM  Result Value Ref Range   Heparin Unfractionated 1.90 (H) 0.30 - 0.70 IU/mL    Comment: RESULTS CONFIRMED BY MANUAL DILUTION (NOTE) If heparin results are below expected values, and patient dosage has  been confirmed, suggest follow up testing of antithrombin III levels. Performed at [facility], [place] [address], [place], [place] [address]   Heparin level (unfractionated)     Status: Abnormal   Collection Time: 07/05/18  6:15 AM  Result Value Ref Range   Heparin Unfractionated >2.20 (H) 0.30 - 0.70 IU/mL    Comment: RESULTS CONFIRMED BY MANUAL DILUTION Performed at [facility], [place] [address] [place], [place] [address]   APTT     Status: Abnormal   Collection Time: 07/05/18  6:15 AM  Result Value Ref Range   aPTT 78 (H) 24 - 36 seconds    Comment:        IF BASELINE aPTT IS ELEVATED, SUGGEST PATIENT RISK ASSESSMENT BE USED TO DETERMINE APPROPRIATE ANTICOAGULANT THERAPY. Performed at [facility], [place] [address], [place], [place] [address]     Studies/Results: Darletta Moll Duanne Limerick  W/kub  Result Date: 07/03/2018 CLINICAL DATA:  Gastric outlet obstruction. EXAM: UPPER GI SERIES WITHOUT KUB TECHNIQUE: Routine upper GI series was performed with thin barium. In this setting obstruction barium would not be usually indicated, but the patient is allergic to iodinated contrast. As much barium as possible was aspirated from the stomach at the termination of the exam (120 cc of barium was used and 450 cc of material was aspirated at the end of the exam). FLUOROSCOPY TIME:  Fluoroscopy Time:  2 minutes 48 seconds Radiation Exposure Index (if provided by the fluoroscopic device): 13.9 mGy Number of Acquired Spot Images: 0 COMPARISON:  Upper GI 12/31/2017.  Abdominal CT from 2 days ago FINDINGS: Despite nasogastric tube and continued wall section the stomach is still distended with gas and fluid and debris. Mucosal exam is not possible. Dilated stomach with pylorus high in the right upper quadrant. There is focal narrowing at the pyloric level with maximal channel opening of 4 mm. This narrowing is fairly smooth. No extraluminal mass  was seen on prior CT is well. IMPRESSION: 1. Focal smooth narrowing at the pylorus with channel measuring 4 mm. 2. Despite suction, the stomach remains distended by fluid/debris. Electronically Signed   By: Monte Fantasia M.D.   On: 07/03/2018 16:37    Medications: I have reviewed the patient's current medications.  Assessment: Post Surgery for gastric outlet obstruction secondary to severe pyloric stenosis with possible underlying gastroparesis, without improvement  despite multiple dilatations. Gastrojejunostomy,with gastrostomy tube placement for drainage and jejunostomy tube placed for feeding purposes  Plan: Spoke with Dr. Georgette Dover, stomach was massively distended and contained a large amount of debris , noted during surgery. Plan to discontinue NG tube to drainage and continue using gastrostomy tube for drainage Plan to begin feeding from jejunostomy today. Patient will likely be discharged to rehabilitation facility in a few days if she does well. Currently on IV heparin drip, will likely be transitioned to Eliquis nearing discharge.   [name] 07/05/2018, 9:17 AM [phone]

## 2018-07-05 NOTE — Progress Notes (Addendum)
Angela [name] for heparin Indication: atrial fibrillation  Allergies  Allergen Reactions  . Peanut-Containing Drug Products Anaphylaxis  . Reglan [Metoclopramide] Other (See Comments)    "Messed my mind up"  . Clindamycin/Lincomycin Diarrhea  . Codeine Nausea Only  . Iohexol Swelling and Other (See Comments)     Desc: pt received hypaque 60% in 1994 and had erythema, hives & lips swell.  She did ok w/o premeds in 8/05 at [facility].  Pt. takes daily prednisone for rheumatoid arthritis.  Poor venous access today (7/7/6) so we did her w/o iv contrast.   . Pineapple Swelling  . Shellfish Allergy Swelling and Other (See Comments)    Lips swell    Patient Measurements: Height: 5\' 1"  (154.9 cm) Weight: 106 lb 14.8 oz (48.5 kg) IBW/kg (Calculated) : 47.8 Heparin Dosing Weight: 48 kg  Vital Signs: Temp: 98.7 F (37.1 C) (08/31 0412) Temp Source: Oral (08/31 0412) BP: 89/51 (08/31 0236) Pulse Rate: 62 (08/31 0236)  Labs: Recent Labs    07/02/2018 1652 07/05/18 0615  APTT 25 78*  HEPARINUNFRC 1.90* >2.20*    Estimated Creatinine Clearance: 43 mL/min (A) (by C-G formula based on SCr of 0.43 mg/dL (L)).   Medical History: Past Medical History:  Diagnosis Date  . A-fib ([place])   . Acute renal failure ([place]) 06/15/2013  . Aortic stenosis    mild by echo 03/2012  . Asthma   . Atrial fibrillation ([place])   . Atrial flutter ([place]) 01/16/2016  . CAD (coronary artery disease)    s/p PCI with BMS to mid LAD--2/08 not on aspirin due to frequent ulcers.  Repeat cath for CP 09/2012 with patent LAD stent and otherwise normal coronary arteries  . Cardiomyopathy ([place]) 01/30/2016  . Cervical stenosis of spine   . Chronic pain syndrome 12/02/2017  . Depression   . Duodenitis 01/16/2016  . Dyslipidemia   . Elbow fracture, right    history of- never any surgery  . Essential hypertension, benign 11/03/2013  . Gastric outlet obstruction 01/06/2016  . Gastroparesis  01/06/2016  . GERD (gastroesophageal reflux disease)   . Headache   . Hypertension   . Hyponatremia 06/15/2013  . Impaired physical mobility    due to rheumatoid arthritis "ambulates with walker" limited free standing.  . Intestinal polyp   . Macular degeneration   . Obesity   . Osteoporosis    alendronate since 2012  . PUD (peptic ulcer disease)    can not take aspirin  . PVC's (premature ventricular contractions)   . RA (rheumatoid arthritis) ([place])   . Rheumatoid arthritis(714.0)    Dr. Lenna Gilford follows  . Spondylolisthesis of lumbar region   . SVT (supraventricular tachycardia) ([place]) 01/16/2016    Medications:  Scheduled:  . acebutolol  200 mg Per J Tube Daily  . amiodarone  100 mg Per J Tube BID  . calcium carbonate (dosed in mg elemental calcium)  1,250 mg Per J Tube BID  . cholecalciferol  200 Units Per Tube BID  . diclofenac sodium  2 g Topical QID  . docusate  100 mg Per J Tube BID  . escitalopram  5 mg Per Tube Daily  . fesoterodine  8 mg Oral Daily  . folic acid  1 mg Oral Daily  . LORazepam  1 mg Intravenous Once  . LORazepam  2 mg Per Tube QHS  . neomycin-bacitracin-polymyxin  1 application Topical Daily  . prednisoLONE  5 mg Per Tube QAC breakfast  .  rosuvastatin  10 mg Per J Tube QHS  . sodium chloride flush  3 mL Intravenous Q12H  . vitamin B-12  1,000 mcg Per J Tube Daily  . vitamin C  500 mg Per J Tube Daily   Infusions:  . sodium chloride    . acetaminophen 1,000 mg (07/05/18 1694)  . dextrose 5 % and 0.9% NaCl 75 mL/hr at 07/05/18 0600  . famotidine (PEPCID) IV 20 mg (07/03/2018 0942)  . heparin 700 Units/hr (07/05/18 0600)  . lactated ringers 10 mL/hr at 06/29/2018 1016    Assessment: Angela [name] has been on eliquis for her afib. It has been held since 8/29 for a GI procedure. She is s/p gastrostomy tube and jejunostomy tube. Plan is to resume apixaban eventually but IV heparin will be used to bridge until that point. Heparin started 8hr post surgery without  bolus. Since she has been on apixaban, will correlate aPTT and heparin level.   aPTT therapeutic today at 78 on 700 units/hr. CBC ordered, no s/sx bleeding.  Goal of Therapy:  Heparin level 0.3-0.7 units/ml Monitor platelets by anticoagulation protocol: Yes   Plan:  Continue heparin at 700 units/hr Monitor daily HL/PTT, CBC, and s/sx of bleeding F/u with resumption of apixaban - possibly 9/1 per surgery   Juanell Fairly, PharmD PGY1 Pharmacy Resident Phone [phone] 07/05/2018 8:37 AM

## 2018-07-05 NOTE — Progress Notes (Signed)
Initial Nutrition Assessment  DOCUMENTATION CODES:   Non-severe (moderate) malnutrition in context of chronic illness  INTERVENTION:   Initiate Jevity 1.2 formula @ 20 ml/hr via J-tube and increase by 10 ml every 8 hours to goal rate of 55 ml/hr.   Tube feeding regimen provides 1584 kcal (100% of needs), 73 grams of protein, and 1069 ml of H2O.   Monitor magnesium, potassium, and phosphorus daily for at least 3 days, MD to replete as needed, as pt is at risk for refeeding syndrome given malnutrition, gastric outlet obstruction, severe pyloricstenosis.  NUTRITION DIAGNOSIS:   Moderate Malnutrition related to altered GI function, chronic illness(severe pyloric stenosis) as evidenced by moderate fat depletion, moderate muscle depletion, energy intake < 75% for > or equal to 1 month.  GOAL:   Patient will meet greater than or equal to 90% of their needs  MONITOR:   TF tolerance, Labs, Skin, I & O's, Weight trends  REASON FOR ASSESSMENT:   Consult Enteral/tube feeding initiation and management  ASSESSMENT:   82 year old with past medical history of severe pyloric stenosis, CAD, atrial fibrillation, hyperlipidemia, osteoporosis, peptic ulcer disease, rheumatoid arthritis came to the hospital with concerns of feeling very nauseous and abdominal discomfort.  In the ER abdominal x-ray showed large stool burden without any other acute pathology.  CT of the abdomen pelvis showed severe gastric distention due to severe pyloric obstruction, s/p gastrojejunostomy, with gastrostomy and feeding jejunostomy tubes placement on 07/03/2018  Pt is currently NPO. Pt reports poor po intake over the past 2 months. Pt with bouts of nausea/vomiting. Pt reports food was sitting in her esophagus and not going in the stomach. Pt with failed EGD esophagus dilation. Pt at risk for refeeding syndrome related to severe pyloric stenosis and malnutrition. Usual body weight reported to be ~140 lbs. Per weight records,  pt last weighed ~140 lbs in 2015. Weight loss not significant for time frame. NGT has been discontinued. G-tube to suction. Pt with 800 ml output. RD consulted to initiate J-tube feedings. RD to initiate at low and slow rate to ensure tolerance and to prevent electrolyte imbalance.   Labs and medications reviewed.   NUTRITION - FOCUSED PHYSICAL EXAM:    Most Recent Value  Orbital Region  Unable to assess  Upper Arm Region  Moderate depletion  Thoracic and Lumbar Region  No depletion  Buccal Region  No depletion  Temple Region  Unable to assess  Clavicle Bone Region  Moderate depletion  Clavicle and Acromion Bone Region  Moderate depletion  Scapular Bone Region  Unable to assess  Dorsal Hand  Mild depletion  Patellar Region  Moderate depletion  Anterior Thigh Region  Moderate depletion  Posterior Calf Region  Severe depletion  Edema (RD Assessment)  Mild  Hair  Reviewed  Eyes  Reviewed  Mouth  Reviewed  Skin  Reviewed  Nails  Reviewed       Diet Order:   Diet Order            Diet NPO time specified Except for: Ice Chips  Diet effective now              EDUCATION NEEDS:   Not appropriate for education at this time  Skin:  Skin Assessment: Skin Integrity Issues: Skin Integrity Issues:: Incisions Incisions: abdomen  Last BM:  8/28  Height:   Ht Readings from Last 1 Encounters:  06/30/2018 5\' 1"  (1.549 m)    Weight:   Wt Readings from Last 1 Encounters:  06/14/2018 48.5 kg    Ideal Body Weight:  48 kg  BMI:  Body mass index is 20.2 kg/m.  Estimated Nutritional Needs:   Kcal:  1400-1600  Protein:  65-75 grams  Fluid:  >/= 1.5 L/day    Corrin Parker, MS, RD, LDN Pager # [phone] After hours/ weekend pager # [phone]

## 2018-07-05 NOTE — Significant Event (Signed)
Rapid Response Event Note SBP 60's Overview: Time Called: 1020 Arrival Time: 1025 Event Type: Neurologic  Initial Focused Assessment: On arrival pt in trendelenburg, skin pale, warm and dry, alert and oriented x3, follows commands, answers all questions. 500NS bolus started PTA. Per staff pt had a syncopal episode SBP 60-70's when attempting to get her up from chair back to bed. SBP 105 after 200cc bolus  Interventions: 500 NS Bolus  CBC-HGB 7.6,dropped from 12 BMP  Plan of Care (if not transferred): Continue to monitor call RRT as needed.  Event Summary: Name of Physician Notified: Dr. Denton Brick  at (PTA RRT )    at    Outcome: Stayed in room and stabalized  Event End Time: [place], [place]

## 2018-07-05 NOTE — Progress Notes (Signed)
  Chart reviewed. Patient tolerated surgery well.   No active cardiac issues at this point.   On heparin. Agree with transitioning back to Eliquis prior to d/c.  Cardiology will sign off today.   Please call with questions.   Will arrange outpatient cardiology f/u.   Glori Bickers, MD  1:45 PM

## 2018-07-05 NOTE — Plan of Care (Signed)
  Problem: Nutrition: Goal: Adequate nutrition will be maintained Outcome: Not Progressing Note:  NGT to low intermittent suction, gastric tubes clamped/to gravity, NPO.    Problem: Elimination: Goal: Will not experience complications related to bowel motility Outcome: Not Progressing Note:  POD1 abdominal surgery Goal: Will not experience complications related to urinary retention Outcome: Not Progressing Note:  Foley in place

## 2018-07-05 NOTE — Progress Notes (Signed)
Patient had a near-syncopal episode this morning when attempting to get up to sit in the recliner. BP dropped to 68/49. Became pale and dizzy. Rapid response called, patient placed in trandelelberg, and fluid bolus started. BP increased to 105/50. Rapid response and MD notified. Will continue to monitor.

## 2018-07-06 LAB — TYPE AND SCREEN
ABO/RH(D): O POS
ANTIBODY SCREEN: NEGATIVE
Unit division: 0
Unit division: 0

## 2018-07-06 LAB — BPAM RBC
BLOOD PRODUCT EXPIRATION DATE: 201909262359
Blood Product Expiration Date: 201909262359
ISSUE DATE / TIME: 201908311702
ISSUE DATE / TIME: 201908311429
UNIT TYPE AND RH: 5100
Unit Type and Rh: 5100

## 2018-07-06 LAB — GLUCOSE, CAPILLARY
GLUCOSE-CAPILLARY: 127 mg/dL — AB (ref 70–99)
GLUCOSE-CAPILLARY: 159 mg/dL — AB (ref 70–99)
GLUCOSE-CAPILLARY: 169 mg/dL — AB (ref 70–99)
Glucose-Capillary: 165 mg/dL — ABNORMAL HIGH (ref 70–99)
Glucose-Capillary: 131 mg/dL — ABNORMAL HIGH (ref 70–99)
Glucose-Capillary: 122 mg/dL — ABNORMAL HIGH (ref 70–99)

## 2018-07-06 LAB — BASIC METABOLIC PANEL
ANION GAP: 7 (ref 5–15)
BUN: 29 mg/dL — AB (ref 8–23)
CALCIUM: 8 mg/dL — AB (ref 8.9–10.3)
CO2: 23 mmol/L (ref 22–32)
Chloride: 105 mmol/L (ref 98–111)
Creatinine, Ser: 1.18 mg/dL — ABNORMAL HIGH (ref 0.44–1.00)
GFR calc Af Amer: 49 mL/min — ABNORMAL LOW (ref >60)
GFR calc non Af Amer: 43 mL/min — ABNORMAL LOW (ref >60)
GLUCOSE: 161 mg/dL — AB (ref 70–99)
Potassium: 3.4 mmol/L — ABNORMAL LOW (ref 3.5–5.1)
Sodium: 135 mmol/L (ref 135–145)

## 2018-07-06 LAB — CBC
HEMATOCRIT: 30.1 % — AB (ref 36.0–46.0)
Hemoglobin: 10.2 g/dL — ABNORMAL LOW (ref 12.0–15.0)
MCH: 33.7 pg (ref 26.0–34.0)
MCHC: 33.9 g/dL (ref 30.0–36.0)
MCV: 99.3 fL (ref 78.0–100.0)
Platelets: 149 10*3/uL — ABNORMAL LOW (ref 150–400)
RBC: 3.03 MIL/uL — ABNORMAL LOW (ref 3.87–5.11)
RDW: 18.3 % — ABNORMAL HIGH (ref 11.5–15.5)
WBC: 12.7 10*3/uL — AB (ref 4.0–10.5)

## 2018-07-06 MED ORDER — OXYCODONE-ACETAMINOPHEN 7.5-325 MG PO TABS
1.0000 | ORAL_TABLET | Freq: Four times a day (QID) | ORAL | Status: DC | PRN
  Administered 2018-07-06 – 2018-07-10 (×7): 1 via JEJUNOSTOMY
  Filled 2018-07-06 (×7): qty 1

## 2018-07-06 MED ORDER — POTASSIUM CHLORIDE 20 MEQ/15ML (10%) PO SOLN
40.0000 meq | Freq: Once | ORAL | Status: AC
  Administered 2018-07-06: 40 meq
  Filled 2018-07-06: qty 30

## 2018-07-06 MED ORDER — LANSOPRAZOLE 15 MG PO TBDP
15.0000 mg | ORAL_TABLET | Freq: Two times a day (BID) | ORAL | Status: DC
  Administered 2018-07-06 – 2018-07-10 (×8): 15 mg
  Filled 2018-07-06 (×9): qty 1

## 2018-07-06 MED ORDER — TAMSULOSIN HCL 0.4 MG PO CAPS
0.4000 mg | ORAL_CAPSULE | Freq: Every day | ORAL | Status: DC
  Administered 2018-07-06 – 2018-07-09 (×4): 0.4 mg via ORAL
  Filled 2018-07-06 (×5): qty 1

## 2018-07-06 NOTE — Progress Notes (Signed)
Subjective: The patient was seen and examined at bedside. She became hypotensive and needed a 1 unit PRBC transfusion yesterday. Denies dizziness but complains of weakness. She has soreness in left upper quadrant but denies abdominal pain. Has not had a bowel movement but has been passing flatus.  Objective: Vital signs in last 24 hours: Temp:  [97.6 F (36.4 C)-98.9 F (37.2 C)] 97.6 F (36.4 C) (09/01 0756) Pulse Rate:  [59-61] 61 (08/31 1729) Resp:  [13-21] 13 (09/01 0035) BP: (90-107)/(49-58) 90/58 (09/01 0035) SpO2:  [96 %-99 %] 96 % (09/01 0035) Weight change:  Last BM Date: 07/02/18  HR:CBUL, frail, thinly built, elderly GENERAL:lying on bed, not in acute distress ABDOMEN:J-tube feeding has been initiated, G tube connected- to gravity for drainage,soft, mild generalized tenderness and tenderness around surgical incision, hypoactive bowel sounds EXTREMITIES:improved swelling of bilateral lower extremities  Lab Results: Results for orders placed or performed during the hospital encounter of 06/26/2018 (from the past 48 hour(s))  APTT     Status: None   Collection Time: 06/30/2018  4:52 PM  Result Value Ref Range   aPTT 25 24 - 36 seconds    Comment: Performed at [facility], [address] [place], [place] [address]  Heparin level (unfractionated)     Status: Abnormal   Collection Time: 06/09/2018  4:52 PM  Result Value Ref Range   Heparin Unfractionated 1.90 (H) 0.30 - 0.70 IU/mL    Comment: RESULTS CONFIRMED BY MANUAL DILUTION (NOTE) If heparin results are below expected values, and patient dosage has  been confirmed, suggest follow up testing of antithrombin III levels. Performed at [facility], [place] [address], [place], [place] [address]   Heparin level (unfractionated)     Status: Abnormal   Collection Time: 07/05/18  6:15 AM  Result Value Ref Range   Heparin Unfractionated >2.20 (H) 0.30 - 0.70 IU/mL    Comment: RESULTS CONFIRMED BY MANUAL  DILUTION Performed at [facility], [place] [address], [place], [place] [address]   APTT     Status: Abnormal   Collection Time: 07/05/18  6:15 AM  Result Value Ref Range   aPTT 78 (H) 24 - 36 seconds    Comment:        IF BASELINE aPTT IS ELEVATED, SUGGEST PATIENT RISK ASSESSMENT BE USED TO DETERMINE APPROPRIATE ANTICOAGULANT THERAPY. Performed at [facility], [place] [address] [place], [place] [address]   Basic metabolic panel     Status: Abnormal   Collection Time: 07/05/18 11:05 AM  Result Value Ref Range   Sodium 133 (L) 135 - 145 mmol/L   Potassium 3.4 (L) 3.5 - 5.1 mmol/L   Chloride 103 98 - 111 mmol/L   CO2 20 (L) 22 - 32 mmol/L   Glucose, Bld 291 (H) 70 - 99 mg/dL   BUN 16 8 - 23 mg/dL   Creatinine, Ser 0.86 0.44 - 1.00 mg/dL   Calcium 7.6 (L) 8.9 - 10.3 mg/dL   GFR calc non Af Amer >60 >60 mL/min   GFR calc Af Amer >60 >60 mL/min    Comment: (NOTE) The eGFR has been calculated using the CKD EPI equation. This calculation has not been validated in all clinical situations. eGFR's persistently <60 mL/min signify possible Chronic Kidney Disease.    Anion gap 10 5 - 15    Comment: Performed at [facility] [address], [place], [place] [address]  CBC     Status: Abnormal  Collection Time: 07/05/18 11:05 AM  Result Value Ref Range   WBC 10.7 (H) 4.0 - 10.5 K/uL   RBC 2.04 (L) 3.87 - 5.11 MIL/uL   Hemoglobin 7.6 (L) 12.0 - 15.0 g/dL   HCT 22.3 (L) 36.0 - 46.0 %   MCV 109.3 (H) 78.0 - 100.0 fL   MCH 37.3 (H) 26.0 - 34.0 pg   MCHC 34.1 30.0 - 36.0 g/dL   RDW 13.2 11.5 - 15.5 %   Platelets 182 150 - 400 K/uL    Comment: Performed at [facility], [place] [address], [place], [place] [address]  ABO/Rh     Status: None   Collection Time: 07/05/18 12:22 PM  Result Value Ref Range   ABO/RH(D)      O POS Performed at [facility] [address], [place], [place] [address]   Prepare RBC     Status: None   Collection Time: 07/05/18 12:22 PM   Result Value Ref Range   Order Confirmation      ORDER PROCESSED BY BLOOD BANK Performed at [facility], [place] [address], [place], [place] [address]   Type and screen [place]     Status: None   Collection Time: 07/05/18 12:51 PM  Result Value Ref Range   ABO/RH(D) O POS    Antibody Screen NEG    Sample Expiration 07/08/2018    Unit Number [id number]    Blood Component Type RED CELLS,LR    Unit division 00    Status of Unit ISSUED,FINAL    Transfusion Status OK TO TRANSFUSE    Crossmatch Result      Compatible Performed at [facility], [place] [address] [place], [place] [address]    Unit Number [id number]    Blood Component Type RED CELLS,LR    Unit division 00    Status of Unit ISSUED,FINAL    Transfusion Status OK TO TRANSFUSE    Crossmatch Result Compatible   Glucose, capillary     Status: Abnormal   Collection Time: 07/05/18  5:33 PM  Result Value Ref Range   Glucose-Capillary 157 (H) 70 - 99 mg/dL  Glucose, capillary     Status: Abnormal   Collection Time: 07/05/18  8:09 PM  Result Value Ref Range   Glucose-Capillary 179 (H) 70 - 99 mg/dL   Comment 1 Notify RN    Comment 2 Document in Chart   Glucose, capillary     Status: Abnormal   Collection Time: 07/06/18 12:41 AM  Result Value Ref Range   Glucose-Capillary 169 (H) 70 - 99 mg/dL   Comment 1 Notify RN    Comment 2 Document in Chart   Glucose, capillary     Status: Abnormal   Collection Time: 07/06/18  4:26 AM  Result Value Ref Range   Glucose-Capillary 159 (H) 70 - 99 mg/dL  CBC     Status: Abnormal   Collection Time: 07/06/18  4:42 AM  Result Value Ref Range   WBC 12.7 (H) 4.0 - 10.5 K/uL   RBC 3.03 (L) 3.87 - 5.11 MIL/uL   Hemoglobin 10.2 (L) 12.0 - 15.0 g/dL    Comment: DELTA CHECK NOTED POST TRANSFUSION SPECIMEN    HCT 30.1 (L) 36.0 - 46.0 %   MCV 99.3 78.0 - 100.0 fL    Comment: DELTA CHECK NOTED POST TRANSFUSION SPECIMEN    MCH 33.7 26.0 - 34.0 pg   MCHC  33.9 30.0 - 36.0 g/dL  RDW 18.3 (H) 11.5 - 15.5 %   Platelets 149 (L) 150 - 400 K/uL    Comment: Performed at [facility], [place] [address], [place], [place] [address]  Glucose, capillary     Status: Abnormal   Collection Time: 07/06/18  7:54 AM  Result Value Ref Range   Glucose-Capillary 165 (H) 70 - 99 mg/dL   Comment 1 Document in Chart   Basic metabolic panel     Status: Abnormal   Collection Time: 07/06/18  8:43 AM  Result Value Ref Range   Sodium 135 135 - 145 mmol/L   Potassium 3.4 (L) 3.5 - 5.1 mmol/L   Chloride 105 98 - 111 mmol/L   CO2 23 22 - 32 mmol/L   Glucose, Bld 161 (H) 70 - 99 mg/dL   BUN 29 (H) 8 - 23 mg/dL   Creatinine, Ser 1.18 (H) 0.44 - 1.00 mg/dL   Calcium 8.0 (L) 8.9 - 10.3 mg/dL   GFR calc non Af Amer 43 (L) >60 mL/min   GFR calc Af Amer 49 (L) >60 mL/min    Comment: (NOTE) The eGFR has been calculated using the CKD EPI equation. This calculation has not been validated in all clinical situations. eGFR's persistently <60 mL/min signify possible Chronic Kidney Disease.    Anion gap 7 5 - 15    Comment: Performed at [facility] [address], [place], [place] [address]    Studies/Results: No results found.  Medications: I have reviewed the patient's current medications.  Assessment: 1.Open gastrojejunostomy for pyloric stenosis which did not respond to multiple EGD and dilatations in the past. Feeding from J-tube initiated, receiving 40 mL per hour, tolerating well Minimal drainage from gastrostomy tube.  2.Anemia, hypotension, received 2 unit PRBC transfusion yesterday, hemoglobin has improved to 10.2 from 7.6.  3.Multiple comorbidities-paroxysmal atrial fibrillation, coronary artery disease, rheumatoid arthritis, cardiomyopathy, malnutrition  Plan: Receiving ice chips, diet to be advanced orally as per surgical recommendations. Encouraged to be out of bed to chair with support, physical therapy encouraged if blood pressure  allows. Patient understands that recovery is going to be a slow process. If patient does not have a bowel movement for the next several days, will need to consider getting an abdominal x-ray and use of suppository as needed( history of constipation needing stool softeners at home)   [name] 07/06/2018, 11:23 AM   Pager [phone] If no answer or after 5 PM call [phone]

## 2018-07-06 NOTE — Progress Notes (Signed)
2 Days Post-Op   Subjective/Chief Complaint: Patient awake, alert Complaining of some tenderness at incision Gastrostomy tube draining well; tolerating TFs at 40cc/hr Denies flatus/BM yet; mild distention   Objective: Vital signs in last 24 hours: Temp:  [97.6 F (36.4 C)-98.9 F (37.2 C)] 97.6 F (36.4 C) (09/01 0756) Pulse Rate:  [59-61] 61 (08/31 1729) Resp:  [13-21] 13 (09/01 0035) BP: (90-107)/(49-58) 90/58 (09/01 0035) SpO2:  [96 %-99 %] 96 % (09/01 0035) Last BM Date: 07/02/18  Intake/Output from previous day: 08/31 0701 - 09/01 0700 In: 772 [P.O.:120; Blood:352; NG/GT:300] Out: 1000 [Urine:200; Emesis/NG output:800] Intake/Output this shift: No intake/output data recorded.  General appearance: alert, cooperative and no distress Resp: clear to auscultation bilaterally Cardio: regular rate and rhythm, S1, S2 normal, no murmur, click, rub or gallop GI: soft, minimal tenderness in upper midline; G-tube with gastric contents; J-tube with tube feeds running Incision c/d/i through honeycomb dressing  Lab Results:  Recent Labs    07/05/18 1105 07/06/18 0442  WBC 10.7* 12.7*  HGB 7.6* 10.2*  HCT 22.3* 30.1*  PLT 182 149*   BMET Recent Labs    07/05/18 1105  NA 133*  K 3.4*  CL 103  CO2 20*  GLUCOSE 291*  BUN 16  CREATININE 0.86  CALCIUM 7.6*   PT/INR No results for input(s): LABPROT, INR in the last 72 hours. ABG No results for input(s): PHART, HCO3 in the last 72 hours.  Invalid input(s): PCO2, PO2  Studies/Results: No results found.  Anti-infectives: Anti-infectives (From admission, onward)   Start     Dose/Rate Route Frequency Ordered Stop   06/29/2018 0915  cefoTEtan (CEFOTAN) 1 g in sodium chloride 0.9 % 100 mL IVPB     1 g 200 mL/hr over 30 Minutes Intravenous To Surgery 06/06/2018 0830 06/08/2018 1132   06/30/18 0645  cefTRIAXone (ROCEPHIN) 1 g in sodium chloride 0.9 % 100 mL IVPB  Status:  Discontinued     1 g 200 mL/hr over 30 Minutes  Intravenous Every 24 hours 06/30/18 0643 07/02/18 1655   06/26/2018 1500  cefTRIAXone (ROCEPHIN) 1 g in sodium chloride 0.9 % 100 mL IVPB  Status:  Discontinued     1 g 200 mL/hr over 30 Minutes Intravenous Every 24 hours 07/05/2018 1451 07/05/2018 1528      Assessment/Plan: CAD s/p stent in 2008, not on ASA due to frequent ulcers SVT HTN Atrial fibrillation PUD  Gastroparesis and GOO Rheumatoid arthritis/Spondylolisthesis/Chronic pain syndrome Osteoporosis UTI - on IV rocephin  Constipation   Gastric outlet obstruction secondary to severe pyloric stenosis - EGD with bx 8/13 did not show malignancy, pyloric stenosis most likely due to PUD - past balloon dilations 01/10/18, 01/24/18, 04/15/18, 06/17/18 - all by Dr. Therisa Doyne -continue NG tube to LIWS, transition all meds to IV -continue to holdeliquis - may restart 07/06/18 -Opengastrojejunostomy, with gastrostomy and feeding jejunostomy tubes - 06/06/2018 - Dr. Georgette Dover  Continue gastrostomy to straight drain Ice chips Continue J tube feeds as tolerated; if increased distention, would hold. May use j tube for meds if finely crushed or liquid - cannot allow j-tube to become clogged - must flush with 50cc of water Any time it is disconnected. OOB to chair    LOS: 6 days   Angela [name] 07/06/2018

## 2018-07-06 NOTE — Progress Notes (Signed)
Patient's foley was removed about 11am and she has not voided.  Bladder scanner read 156 ml.  The patient denies discomfort. The provider on call was notified.  Will monitor patient and bladder volume and provide the patient additional opportunities to empty bladder.

## 2018-07-06 NOTE — Progress Notes (Signed)
Patient Demographics:    Angela [name], is a 82 y.o. female, DOB - 10-17-1936, OZY:[id number]  Admit date - 06/23/2018   Admitting Physician [name], MD  Outpatient Primary MD for the patient is [name], MD  LOS - 6   Chief Complaint  Patient presents with  . Failure To Thrive  . Nausea        Subjective:    [name] today has no fevers, passing gas no BM, no fevers, no chest pains no palpitations blood pressures are soft  Assessment  & Plan :    Principal Problem:   Gastric outlet obstruction Active Problems:   Acute blood loss anemia   Cervical spondylosis with myelopathy   Rheumatoid arthritis (HCC)   Hyponatremia   Dyslipidemia   Essential hypertension, benign   Aortic stenosis   CAD (coronary artery disease)   Constipation   PAF (paroxysmal atrial fibrillation) (HCC)   Cardiomyopathy (HCC)   Malnutrition of moderate degree  Brief  Summary:  82 year old with past medical history of severe pyloric stenosis, CAD, atrial fibrillation, hyperlipidemia, osteoporosis, peptic ulcer disease, rheumatoid arthritis came to the hospital with concerns of feeling very nauseous and abdominal discomfort.  In the ER abdominal x-ray showed large stool burden without any other acute pathology.  Sodium was noted to be 128 and potassium 5.9.  She was started on bowel regimen and admitted to the hospital.  With IV fluids her sodium improved to 130 and potassium trended down to 3.9.  UA was suggestive of possible urinary tract infection therefore started on IV Rocephin.  After starting the patient on aggressive bowel regimen she had few bowel movements but her abdomen appears slightly more distended.  CT of the abdomen pelvis showed severe gastric distention due to severe pyloric obstruction, s/p Open gastrojejunostomy, with gastrostomy andfeedingjejunostomy tubes placement on  06/10/2018   Plan:- 1)Severe Pyloric Stenosis--- resulting in severe gastric distention and recurrent nausea vomiting, poor oral intake, weight loss and protein caloric malnutrition--- failed multiple attempts at EGD with dilatation, last dilatation was 06/17/2018, GI and surgical consult appreciated ,  On 06/11/2018 she underwent Open gastrojejunal bypass  with gastrojejunostomy, with  Gastrostomy tube for drainage and jejunostomy tube for feeding,    NG tube d/ced 07/05/18,  upper GI series on 07/03/2018 shows focal smooth narrowing of the pylorus with stranding measuring 4 mm-by suction the with NG tube in the stomach remained distended by fluid/debris's,    Preop clearance from cardiology appreciated, they recommend no further cardiovascular testing prior to surgery.      2)Moderate protein caloric malnutrition-  C/n  Jevity 1.2 formula   via J-tube and increase by 10 ml every 8 hours to goal rate of 55 ml/hr (currently at 40 ml/hr), change water flushes to 90 mL every 4 hours  3)PAFib--- s/p DCCV 05/09/2018, remains in sinus rhythm, stable, c/n amiodarone 100 mg twice daily , c/n acebutolol 200 mg daily (hold paramenters),  hold Eliquis due to concerns about possible postop bleeding and acute blood loss anemia, cardiology input/consult appreciated, may use propranolol as needed for palpitations  4)H/o CAD--- Bare metal stent apparently around 2003, no ACS type symptoms at this time, okay to resume acebutolol 200 mg daily and crestor 10 mg  via J tube  , cardiology consult appreciated  5)Rheumatoid Arthritis---  PTA was prednisone 5 mg daily ,  received stress dose steroids perioperatively, changed to Orapred 10 mg daily and then plan to discharged on 5 mg when ready for discharge  6) acute blood loss anemia--- baseline hemoglobin usually around 12,  hemoglobin is up to 10.2 from  7.6 ( baseline of 12), transfused 2 units of packed cells on 07/05/18,    7)Acute urinary Retention- failed in and out  catheterization, okay to leave Foley catheter in a.m., start Flomax 0.4 mg, voiding trial in about 48 hours  8) generalized weakness/debility--- unable to significantly work with physical therapy occupational therapy at this time due to soft blood pressures, patient will most likely need skilled nursing facility rehab  Disposition/Need for in-Hospital Stay- patient unable to be discharged at this time due to   s/p gastrojejunostomy, with gastrostomy andfeedingjejunostomy tubes placement on 07/02/2018, need to make sure patient tolerates feeding via J tube prior to discharge, postop blood pressures are soft, also has acute urinary retention and generalized weakness will most likely need skilled nursing facility placement for rehab  Code Status : full code  Disposition Plan  :  SNF  Consults  :  Gi/Gen surg/cardiology for preop clearance  Procedure-  s/p gastrojejunostomy, with gastrostomy andfeedingjejunostomy tubes placement on 06/18/2018  DVT Prophylaxis  :    SCDs (hold Eliquis to allow for procedures)  Lab Results  Component Value Date   PLT 149 (L) 07/06/2018   Inpatient Medications  Scheduled Meds: . sodium chloride   Intravenous Once  . acebutolol  200 mg Per J Tube Daily  . amiodarone  100 mg Per J Tube BID  . calcium carbonate (dosed in mg elemental calcium)  1,250 mg Per J Tube BID  . cholecalciferol  200 Units Per Tube BID  . diclofenac sodium  2 g Topical QID  . docusate  100 mg Per J Tube BID  . escitalopram  5 mg Per Tube Daily  . fesoterodine  8 mg Oral Daily  . folic acid  1 mg Oral Daily  . lansoprazole  15 mg Per Tube BID  . LORazepam  1 mg Intravenous Once  . LORazepam  2 mg Per Tube QHS  . neomycin-bacitracin-polymyxin  1 application Topical Daily  . potassium chloride  40 mEq Per Tube Once  . potassium chloride  40 mEq Per Tube Once  . prednisoLONE  10 mg Per Tube QAC breakfast  . rosuvastatin  10 mg Per J Tube QHS  . sodium chloride flush  3 mL Intravenous  Q12H  . vitamin B-12  1,000 mcg Per J Tube Daily  . vitamin C  500 mg Per J Tube Daily   Continuous Infusions: . sodium chloride    . feeding supplement (JEVITY 1.2 CAL) 50 mL/hr at 07/06/18 1421   PRN Meds:.sodium chloride, acetaminophen (TYLENOL) oral liquid 160 mg/5 mL, bisacodyl, morphine injection, nitroGLYCERIN, [DISCONTINUED] ondansetron **OR** ondansetron (ZOFRAN) IV, phenol, propranolol, sodium chloride flush    Anti-infectives (From admission, onward)   Start     Dose/Rate Route Frequency Ordered Stop   06/23/2018 0915  cefoTEtan (CEFOTAN) 1 g in sodium chloride 0.9 % 100 mL IVPB     1 g 200 mL/hr over 30 Minutes Intravenous To Surgery 06/08/2018 0830 06/23/2018 1132   06/30/18 0645  cefTRIAXone (ROCEPHIN) 1 g in sodium chloride 0.9 % 100 mL IVPB  Status:  Discontinued     1 g 200  mL/hr over 30 Minutes Intravenous Every 24 hours 06/30/18 0643 07/02/18 1655   07/01/2018 1500  cefTRIAXone (ROCEPHIN) 1 g in sodium chloride 0.9 % 100 mL IVPB  Status:  Discontinued     1 g 200 mL/hr over 30 Minutes Intravenous Every 24 hours 06/09/2018 1451 06/30/2018 1528        Objective:   Vitals:   07/05/18 1729 07/05/18 1958 07/06/18 0035 07/06/18 0756  BP: (!) 106/51 (!) 107/53 (!) 90/58   Pulse: 61     Resp: 17 15 13    Temp: 98.2 F (36.8 C) 98.7 F (37.1 C) 98.2 F (36.8 C) 97.6 F (36.4 C)  TempSrc: Oral Oral Axillary Oral  SpO2: 97% 97% 96%   Weight:      Height:        Wt Readings from Last 3 Encounters:  07/03/2018 48.5 kg  06/17/18 48.5 kg  06/03/18 48.9 kg     Intake/Output Summary (Last 24 hours) at 07/06/2018 1510 Last data filed at 07/06/2018 0600 Gross per 24 hour  Intake 742 ml  Output 200 ml  Net 542 ml    Physical Exam  Gen:- Awake Alert,  In no apparent distress  HEENT:- [place].AT, No sclera icterus Neck-Supple Neck,No JVD,.  Lungs-  CTAB , good air movement CV- S1, S2 normal Abd-  +ve B.Sounds, G-tube with scant drainage, J-tube noted, abdominal wall postop  wound with serosanguineous discharge, postop tenderness noted extremity/Skin:-Good pulses, chronic right elbow deformity, pale Psych-affect is appropriate, oriented x3 Neuro-no new focal deficits, no tremors GU-Foley catheter to be placed due to urinary retention   Data Review:   Micro Results Recent Results (from the past 240 hour(s))  Urine Culture     Status: Abnormal   Collection Time: 07/02/2018  1:47 PM  Result Value Ref Range Status   Specimen Description URINE, RANDOM  Final   Special Requests   Final    NONE Performed at [facility], [place] [address], [place], [place] [address]    Culture MULTIPLE SPECIES PRESENT, SUGGEST RECOLLECTION (A)  Final   Report Status 06/30/2018 FINAL  Final    Radiology Reports Ct Abdomen Pelvis Wo Contrast  Result Date: 07/01/2018 CLINICAL DATA:  Abdominal distention, pain, nausea, history of severe pyloric stenosis EXAM: CT ABDOMEN AND PELVIS WITHOUT CONTRAST TECHNIQUE: Multidetector CT imaging of the abdomen and pelvis was performed following the standard protocol without IV contrast. COMPARISON:  Chest and abdomen films of 06/09/2017 and CT abdomen pelvis of 01/10/2016 FINDINGS: Lower chest: There are moderate-sized bilateral pleural effusions present with bibasilar linear atelectasis. Moderate cardiomegaly is again noted. No pericardial effusion is seen. Hepatobiliary: The liver is unremarkable in this unenhanced study. Surgical clips are present from prior cholecystectomy. Pancreas: Pancreas appears atrophic. The pancreatic duct is not dilated. Spleen: The spleen is unremarkable with calcified splenic artery noted. Adrenals/Urinary Tract: Adrenal glands appear normal. No renal calculi are seen and there is no evidence of hydronephrosis. The ureters do not appear to be dilated there are difficult to follow in this patient. The urinary bladder is distended with no abnormality evident. Stomach/Bowel: The stomach is massively distended with fluid and  food debris as well as oral contrast within this markedly distended stomach. In this patient with history of severe pyloric stenosis, recurrence of severe pyloric stenosis is the primary consideration. No definite mass is seen. No small bowel distention is seen. The mucosa of the ano-rectal region is somewhat thickened of questionable significance. Clinical correlation is recommended. The  majority of the colon is decompressed and very difficult to assess. The terminal ileum is poorly visualized but the appendix does appear to fill with air and is unremarkable. Vascular/Lymphatic: Moderate abdominal aortic atherosclerosis is present. No aneurysm is noted. There is no evidence of adenopathy. Reproductive: The uterus is somewhat atrophic. No adnexal lesion is seen. No fluid is noted within the pelvis. Other: No abdominal wall hernia is seen. Musculoskeletal: There is anterolisthesis of L4 on L5 by 5 mm and anterolisthesis of L3 on L4 by 8 mm. Both of these malalignments appear to be due to degenerative change of the facet joints. There is considerable degenerative disc disease at both L3-4 and L4-5 levels. No compression deformity is seen. There is mild degenerative joint disease of the hips present. IMPRESSION: 1. Massively distended stomach with food and fluid present. This most likely is due to recurrence of severe pyloric stenosis by history. 2. Circumferential thickening of the a no rectal mucosa of questionable significance. Correlate clinically. 3. Degenerative disc disease at L3-4 and L4-5 as noted above. 4. Moderate-sized bilateral pleural effusions with bibasilar atelectasis. Cardiomegaly. Electronically Signed   By: Ivar Drape M.D.   On: 07/01/2018 13:28   Dg Abd Acute W/chest  Result Date: 06/10/2018 CLINICAL DATA:  Nausea. EXAM: DG ABDOMEN ACUTE W/ 1V CHEST COMPARISON:  Radiographs of May 14, 2018. FINDINGS: Large amount of stool seen throughout the colon. No abnormal bowel dilatation is noted. No  radiopaque calculi or other significant radiographic abnormality is seen. Stable cardiomegaly. Status post cholecystectomy. Atherosclerosis of thoracic aorta is noted. Both lungs are clear. IMPRESSION: Large stool burden is noted. No abnormal bowel dilatation is noted. No acute cardiopulmonary disease. Aortic Atherosclerosis (ICD10-I70.0). Electronically Signed   By: Marijo Conception, M.D.   On: 06/23/2018 13:23   Dg Abd Portable 1v  Result Date: 07/02/2018 CLINICAL DATA:  Insertion of nasogastric tube EXAM: PORTABLE ABDOMEN - 1 VIEW COMPARISON:  Portable exam 1620 hours compared to 06/30/2018 FINDINGS: Nasogastric tube present with tip projecting over mid stomach. Scattered stool throughout colon. No definite bowel wall thickening or obstruction. Bones demineralized. Scattered vascular calcifications. IMPRESSION: Tip of nasogastric tube projects over mid stomach. Electronically Signed   By: Lavonia Dana M.D.   On: 07/02/2018 16:38   Dg Duanne Limerick  W/kub  Result Date: 07/03/2018 CLINICAL DATA:  Gastric outlet obstruction. EXAM: UPPER GI SERIES WITHOUT KUB TECHNIQUE: Routine upper GI series was performed with thin barium. In this setting obstruction barium would not be usually indicated, but the patient is allergic to iodinated contrast. As much barium as possible was aspirated from the stomach at the termination of the exam (120 cc of barium was used and 450 cc of material was aspirated at the end of the exam). FLUOROSCOPY TIME:  Fluoroscopy Time:  2 minutes 48 seconds Radiation Exposure Index (if provided by the fluoroscopic device): 13.9 mGy Number of Acquired Spot Images: 0 COMPARISON:  Upper GI 12/31/2017.  Abdominal CT from 2 days ago FINDINGS: Despite nasogastric tube and continued wall section the stomach is still distended with gas and fluid and debris. Mucosal exam is not possible. Dilated stomach with pylorus high in the right upper quadrant. There is focal narrowing at the pyloric level with maximal  channel opening of 4 mm. This narrowing is fairly smooth. No extraluminal mass was seen on prior CT is well. IMPRESSION: 1. Focal smooth narrowing at the pylorus with channel measuring 4 mm. 2. Despite suction, the stomach remains distended by fluid/debris. Electronically  Signed   By: Monte Fantasia M.D.   On: 07/03/2018 16:37     CBC Recent Labs  Lab 07/05/18 1105 07/06/18 0442  WBC 10.7* 12.7*  HGB 7.6* 10.2*  HCT 22.3* 30.1*  PLT 182 149*  MCV 109.3* 99.3  MCH 37.3* 33.7  MCHC 34.1 33.9  RDW 13.2 18.3*    Chemistries  Recent Labs  Lab 06/30/18 0500 07/05/18 1105 07/06/18 0843  NA 130* 133* 135  K 3.9 3.4* 3.4*  CL 100 103 105  CO2 23 20* 23  GLUCOSE 104* 291* 161*  BUN 9 16 29*  CREATININE 0.43* 0.86 1.18*  CALCIUM 7.7* 7.6* 8.0*   ------------------------------------------------------------------------------------------------------------------ No results for input(s): CHOL, HDL, LDLCALC, TRIG, CHOLHDL, LDLDIRECT in the last 72 hours.  Lab Results  Component Value Date   HGBA1C  08/06/2009    5.2 (NOTE) The ADA recommends the following therapeutic goal for glycemic control related to Hgb A1c measurement: Goal of therapy: <6.5 Hgb A1c  Reference: American Diabetes Association: Clinical Practice Recommendations 2010, Diabetes Care, 2010, 33: (Suppl  1).   ------------------------------------------------------------------------------------------------------------------ No results for input(s): TSH, T4TOTAL, T3FREE, THYROIDAB in the last 72 hours.  Invalid input(s): FREET3 ------------------------------------------------------------------------------------------------------------------ No results for input(s): VITAMINB12, FOLATE, FERRITIN, TIBC, IRON, RETICCTPCT in the last 72 hours.  Coagulation profile No results for input(s): INR, PROTIME in the last 168 hours.  No results for input(s): DDIMER in the last 72 hours.  Cardiac Enzymes No results for input(s):  CKMB, TROPONINI, MYOGLOBIN in the last 168 hours.  Invalid input(s): CK ------------------------------------------------------------------------------------------------------------------    Component Value Date/Time   BNP 127.6 (H) 06/10/2018 1128   Roxan Hockey M.D on 07/06/2018 at 3:10 PM  Pager---[phone] Go to [email or web address] - password [email or web address] for contact info  [facility] - Office  [phone]

## 2018-07-06 NOTE — Progress Notes (Signed)
Patient has not voided since last in and out cath around 0500. Bladder scan showed 538cc. MD paged and received order to place foley, which was inserted by [place], Therapist, sports. Tolerated well. Will continue to monitor.

## 2018-07-06 DEATH — deceased

## 2018-07-07 ENCOUNTER — Inpatient Hospital Stay (HOSPITAL_COMMUNITY): Payer: Medicare Other

## 2018-07-07 LAB — CBC
HEMATOCRIT: 26.6 % — AB (ref 36.0–46.0)
HEMOGLOBIN: 8.9 g/dL — AB (ref 12.0–15.0)
MCH: 33.8 pg (ref 26.0–34.0)
MCHC: 33.5 g/dL (ref 30.0–36.0)
MCV: 101.1 fL — AB (ref 78.0–100.0)
Platelets: 154 10*3/uL (ref 150–400)
RBC: 2.63 MIL/uL — ABNORMAL LOW (ref 3.87–5.11)
RDW: 17.4 % — AB (ref 11.5–15.5)
WBC: 6.2 10*3/uL (ref 4.0–10.5)

## 2018-07-07 LAB — BASIC METABOLIC PANEL
Anion gap: 4 — ABNORMAL LOW (ref 5–15)
BUN: 23 mg/dL (ref 8–23)
CHLORIDE: 109 mmol/L (ref 98–111)
CO2: 24 mmol/L (ref 22–32)
Calcium: 7.9 mg/dL — ABNORMAL LOW (ref 8.9–10.3)
Creatinine, Ser: 0.71 mg/dL (ref 0.44–1.00)
GFR calc Af Amer: >60 mL/min (ref >60)
GFR calc non Af Amer: >60 mL/min (ref >60)
Glucose, Bld: 120 mg/dL — ABNORMAL HIGH (ref 70–99)
POTASSIUM: 4.4 mmol/L (ref 3.5–5.1)
SODIUM: 137 mmol/L (ref 135–145)

## 2018-07-07 LAB — GLUCOSE, CAPILLARY
GLUCOSE-CAPILLARY: 108 mg/dL — AB (ref 70–99)
GLUCOSE-CAPILLARY: 96 mg/dL (ref 70–99)
Glucose-Capillary: 101 mg/dL — ABNORMAL HIGH (ref 70–99)
Glucose-Capillary: 108 mg/dL — ABNORMAL HIGH (ref 70–99)
Glucose-Capillary: 126 mg/dL — ABNORMAL HIGH (ref 70–99)
Glucose-Capillary: 97 mg/dL (ref 70–99)

## 2018-07-07 MED ORDER — SODIUM CHLORIDE 0.9 % IV BOLUS
250.0000 mL | Freq: Once | INTRAVENOUS | Status: AC
  Administered 2018-07-07: 250 mL via INTRAVENOUS

## 2018-07-07 MED ORDER — SODIUM CHLORIDE 0.9 % IV BOLUS
1000.0000 mL | Freq: Once | INTRAVENOUS | Status: AC
  Administered 2018-07-07: 1000 mL via INTRAVENOUS

## 2018-07-07 MED ORDER — BISACODYL 10 MG RE SUPP
10.0000 mg | Freq: Two times a day (BID) | RECTAL | Status: DC
  Administered 2018-07-07 – 2018-07-11 (×8): 10 mg via RECTAL
  Filled 2018-07-07 (×9): qty 1

## 2018-07-07 MED ORDER — POLYETHYLENE GLYCOL 3350 17 G PO PACK
17.0000 g | PACK | Freq: Every day | ORAL | Status: DC
  Administered 2018-07-07 – 2018-07-08 (×2): 17 g via ORAL
  Filled 2018-07-07 (×3): qty 1

## 2018-07-07 NOTE — Progress Notes (Signed)
3 Days Post-Op   Subjective/Chief Complaint: Patient is awake, alert, comfortable Required replacement of Foley last night No nausea 1250 cc of G-tube output J-tube feeds at goal rate - tolerating well    Objective: Vital signs in last 24 hours: Temp:  [97.9 F (36.6 C)-98.5 F (36.9 C)] 98.3 F (36.8 C) (09/02 0803) Pulse Rate:  [60-70] 66 (09/02 0823) Resp:  [12-22] 17 (09/02 0823) BP: (74-99)/(41-55) 81/44 (09/02 0823) SpO2:  [94 %-97 %] 94 % (09/02 0823) Weight:  [56.1 kg] 56.1 kg (09/02 0406) Last BM Date: 07/02/18  Intake/Output from previous day: 09/01 0701 - 09/02 0700 In: 2622.7 [P.O.:300; NG/GT:504.8; IV Piggyback:1107.9] Out: 1625 [Urine:375; Drains:1250] Intake/Output this shift: No intake/output data recorded.  General appearance: alert, cooperative and no distress GI: soft, incisional tenderness; hypoactive bowel sounds Midline incision c/d/i; g-tube with bilious output, j-tube with tube feeds at goal rate  Lab Results:  Recent Labs    07/06/18 0442 07/07/18 0402  WBC 12.7* 6.2  HGB 10.2* 8.9*  HCT 30.1* 26.6*  PLT 149* 154   BMET Recent Labs    07/06/18 0843 07/07/18 0402  NA 135 137  K 3.4* 4.4  CL 105 109  CO2 23 24  GLUCOSE 161* 120*  BUN 29* 23  CREATININE 1.18* 0.71  CALCIUM 8.0* 7.9*   PT/INR No results for input(s): LABPROT, INR in the last 72 hours. ABG No results for input(s): PHART, HCO3 in the last 72 hours.  Invalid input(s): PCO2, PO2  Studies/Results: No results found.  Anti-infectives: Anti-infectives (From admission, onward)   Start     Dose/Rate Route Frequency Ordered Stop   06/29/2018 0915  cefoTEtan (CEFOTAN) 1 g in sodium chloride 0.9 % 100 mL IVPB     1 g 200 mL/hr over 30 Minutes Intravenous To Surgery 06/14/2018 0830 06/14/2018 1132   06/30/18 0645  cefTRIAXone (ROCEPHIN) 1 g in sodium chloride 0.9 % 100 mL IVPB  Status:  Discontinued     1 g 200 mL/hr over 30 Minutes Intravenous Every 24 hours 06/30/18 0643  07/02/18 1655   06/19/2018 1500  cefTRIAXone (ROCEPHIN) 1 g in sodium chloride 0.9 % 100 mL IVPB  Status:  Discontinued     1 g 200 mL/hr over 30 Minutes Intravenous Every 24 hours 06/20/2018 1451 06/26/2018 1528      Assessment/Plan: CAD s/p stent in 2008, not on ASA due to frequent ulcers SVT HTN Atrial fibrillation PUD  Gastroparesis and GOO Rheumatoid arthritis/Spondylolisthesis/Chronic pain syndrome Osteoporosis UTI - on IV rocephin  Constipation   Gastric outlet obstruction secondary to severe pyloric stenosis - EGD with bx 8/13 did not show malignancy, pyloric stenosis most likely due to PUD - past balloon dilations 01/10/18, 01/24/18, 04/15/18, 06/17/18 - all by Dr. Therisa Doyne -may restart Eliquis -Opengastrojejunostomy, with gastrostomy andfeedingjejunostomy tubes - 06/07/2018 - Dr. Georgette Dover  Continue gastrostomy to straight drain Clear liquids today - I am aware that these will likely drain from the gastrostomy tube Continue J tube feeds as tolerated; if increased distention, would hold. May use j tube for meds if finely crushed or liquid - cannot allow j-tube to become clogged - must flush with 50cc of water Any time it is disconnected. OOB to chair   LOS: 7 days    Maia Petties 07/07/2018

## 2018-07-07 NOTE — Progress Notes (Signed)
Dr Denton Brick notified of pt's BP. MD on floor. Discussed with pt and family. Orders for bolus given. Pt to be in bed until BP stabilizes due to recent hx of syncopal episode. Pt and family agreeable with plan.

## 2018-07-07 NOTE — Progress Notes (Signed)
PT BP low 70/40's. Pt alert and oriented x4, asymptomatic at this time.  Paged Dr Denton Brick thru [place]. Pt received bolus overnight for same. Waiting on call back.

## 2018-07-07 NOTE — Progress Notes (Signed)
NP on call notified that patient had a BP of 80/45 following her jtube output of over a liter.  After a liter bolus was ordered and administered, the BP was 81/51.  Patient is asymptomatic, alert and oriented x4, and denying chest pain/discomfort.  NP on call notified that there were no changes, and that she trended in the mid 80's yesterday on day shift for the majority of the day per previous RN.  No new orders given.  Will continue to monitor patient.

## 2018-07-07 NOTE — Progress Notes (Signed)
Patient Demographics:    Angela [name], is a 82 y.o. female, DOB - [birth year], QIH:[id number]  Admit date - 06/21/2018   Admitting Physician [name], MD  Outpatient Primary MD for the patient is [name], MD  LOS - 7   Chief Complaint  Patient presents with  . Failure To Thrive  . Nausea        Subjective:    [name] today has no fevers, passing gas no BM, no fevers, brother at bedside, BP was low overnight, got IV fluid boluses, BP low again this morning patient is largely asymptomatic, 1250 cc of G-tube output, tolerating J-tube feeding well  Assessment  & Plan :    Principal Problem:   Gastric outlet obstruction Active Problems:   Acute blood loss anemia   Cervical spondylosis with myelopathy   Rheumatoid arthritis (HCC)   Hyponatremia   Dyslipidemia   Essential hypertension, benign   Aortic stenosis   CAD (coronary artery disease)   Constipation   PAF (paroxysmal atrial fibrillation) (HCC)   Cardiomyopathy (HCC)   Malnutrition of moderate degree  Brief  Summary:  82 year old with past medical history of severe pyloric stenosis, CAD, atrial fibrillation, hyperlipidemia, osteoporosis, peptic ulcer disease, rheumatoid arthritis came to the hospital with concerns of feeling very nauseous and abdominal discomfort.  In the ER abdominal x-ray showed large stool burden without any other acute pathology.  Sodium was noted to be 128 and potassium 5.9.  She was started on bowel regimen and admitted to the hospital.  With IV fluids her sodium improved to 130 and potassium trended down to 3.9.  UA was suggestive of possible urinary tract infection therefore started on IV Rocephin.  After starting the patient on aggressive bowel regimen she had few bowel movements but her abdomen appears slightly more distended.  CT of the abdomen pelvis showed severe gastric distention due to severe  pyloric obstruction, s/p Open gastrojejunostomy, with gastrostomy andfeedingjejunostomy tubes placement on 06/05/2018   Plan:- 1)Severe Pyloric Stenosis/GOO--- resulting in severe gastric distention and recurrent nausea vomiting, poor oral intake, weight loss and protein caloric malnutrition--- failed multiple attempts at EGD with dilatation, last dilatation was 06/17/2018, GI and surgical consult appreciated ,  On 06/30/2018 she underwent Open gastrojejunal bypass  with gastrojejunostomy, with  Gastrostomy tube for drainage and jejunostomy tube for feeding,    NG tube d/ced 07/05/18,  upper GI series on 07/03/2018 shows focal smooth narrowing of the pylorus with stranding measuring 4 mm- despite  suction the with NG tube in the stomach remained distended by fluid/debris's,    Preop clearance from cardiology appreciated, they recommend no further cardiovascular testing prior to surgery.      2)Moderate protein caloric malnutrition-  C/n  Jevity 1.2 formula   via J-tube and increase by 10 ml every 8 hours to goal rate of 55 ml/hr, c/n water flushes at 90 mL every 4 hours  3)PAFib--- s/p DCCV 05/09/2018, remains in sinus rhythm, stable, c/n amiodarone 100 mg twice daily , c/n acebutolol 200 mg daily (hold paramenters),  hold Eliquis due to concerns about possible postop bleeding and acute blood loss anemia, cardiology input/consult appreciated, PTA used propranolol as needed for palpitations, will discontinue at this time due to soft BP  4)H/o CAD--- Bare metal stent apparently around 2003, no ACS type symptoms at this time, c/n acebutolol 200 mg daily with Hold paramentersand crestor 10 mg via J tube  , cardiology consult appreciated  5)Rheumatoid Arthritis/Spondylolisthesis/Chronic Pain Syndrome---  PTA was prednisone 5 mg daily, received stress dose steroids perioperatively, changed to Orapred 10 mg daily and then plan to discharged on 5 mg when ready for discharge  6)Acute Blood Loss Anemia--- baseline  hemoglobin usually around 12,  hemoglobin post-op was 7.6 (baseline 12) , up to 10.2 from 7.6 after transfusion of 2 units of PRBCs on 07/05/18, hgb back down to 8.9 due to IV fluid boluses/hemodilution  7)Acute urinary Retention- failed in and out catheterization,  leave Foley catheter in a.m., start Flomax 0.4 mg, voiding trial in a day or 2  8)Generalized Weakness/Debility--- unable to significantly work with physical therapy occupational therapy at this time due to soft blood pressures, patient will most likely need skilled nursing facility rehab  Disposition/Need for in-Hospital Stay- patient unable to be discharged at this time due to   s/p gastrojejunostomy, with gastrostomy andfeedingjejunostomy tubes placement on 07/05/2018, need to make sure patient tolerates feeding via J tube prior to discharge, postop blood pressures are soft, also has acute urinary retention and generalized weakness will most likely need skilled nursing facility placement for rehab.... Blood pressures have been pretty soft over the last 24 hours requiring IV fluid boluses  Code Status : full code  Disposition Plan  :  SNF  Consults  :  Gi/Gen surg/cardiology for preop clearance  Procedure-  s/p gastrojejunostomy, with gastrostomy andfeedingjejunostomy tubes placement on 06/19/2018  DVT Prophylaxis  :    SCDs (hold Eliquis to allow for procedures)  Lab Results  Component Value Date   PLT 154 07/07/2018   Inpatient Medications  Scheduled Meds: . sodium chloride   Intravenous Once  . acebutolol  200 mg Per J Tube Daily  . amiodarone  100 mg Per J Tube BID  . calcium carbonate (dosed in mg elemental calcium)  1,250 mg Per J Tube BID  . cholecalciferol  200 Units Per Tube BID  . diclofenac sodium  2 g Topical QID  . docusate  100 mg Per J Tube BID  . escitalopram  5 mg Per Tube Daily  . fesoterodine  8 mg Oral Daily  . folic acid  1 mg Oral Daily  . lansoprazole  15 mg Per Tube BID  . LORazepam  1 mg  Intravenous Once  . LORazepam  2 mg Per Tube QHS  . neomycin-bacitracin-polymyxin  1 application Topical Daily  . polyethylene glycol  17 g Oral Daily  . prednisoLONE  10 mg Per Tube QAC breakfast  . rosuvastatin  10 mg Per J Tube QHS  . sodium chloride flush  3 mL Intravenous Q12H  . tamsulosin  0.4 mg Oral QPC supper  . vitamin B-12  1,000 mcg Per J Tube Daily  . vitamin C  500 mg Per J Tube Daily   Continuous Infusions: . sodium chloride    . feeding supplement (JEVITY 1.2 CAL) 1,000 mL (07/06/18 2029)   PRN Meds:.sodium chloride, acetaminophen (TYLENOL) oral liquid 160 mg/5 mL, bisacodyl, morphine injection, nitroGLYCERIN, [DISCONTINUED] ondansetron **OR** ondansetron (ZOFRAN) IV, oxyCODONE-acetaminophen, phenol, propranolol, sodium chloride flush    Anti-infectives (From admission, onward)   Start     Dose/Rate Route Frequency Ordered Stop   06/22/2018 0915  cefoTEtan (CEFOTAN) 1 g in sodium chloride 0.9 % 100 mL IVPB  1 g 200 mL/hr over 30 Minutes Intravenous To Surgery 06/22/2018 0830 06/10/2018 1132   06/30/18 0645  cefTRIAXone (ROCEPHIN) 1 g in sodium chloride 0.9 % 100 mL IVPB  Status:  Discontinued     1 g 200 mL/hr over 30 Minutes Intravenous Every 24 hours 06/30/18 0643 07/02/18 1655   06/22/2018 1500  cefTRIAXone (ROCEPHIN) 1 g in sodium chloride 0.9 % 100 mL IVPB  Status:  Discontinued     1 g 200 mL/hr over 30 Minutes Intravenous Every 24 hours 06/24/2018 1451 06/24/2018 1528        Objective:   Vitals:   07/07/18 0753 07/07/18 0803 07/07/18 0808 07/07/18 0823  BP: (!) 88/50  (!) 76/45 (!) 81/44  Pulse: 65  60 66  Resp: 17  18 17   Temp:  98.3 F (36.8 C)    TempSrc:  Oral    SpO2: 95%  94% 94%  Weight:      Height:        Wt Readings from Last 3 Encounters:  07/07/18 56.1 kg  06/17/18 48.5 kg  06/03/18 48.9 kg     Intake/Output Summary (Last 24 hours) at 07/07/2018 1100 Last data filed at 07/07/2018 0600 Gross per 24 hour  Intake 2522.7 ml  Output 1625 ml   Net 897.7 ml    Physical Exam  Gen:- Awake Alert,  In no apparent distress  HEENT:- [place].AT, No sclera icterus Neck-Supple Neck,No JVD,.  Lungs-  CTAB , good air movement CV- S1, S2 normal Abd-  +ve B.Sounds, G-tube with scant drainage, J-tube noted, abdominal wall postop wound with serosanguineous discharge, postop tenderness noted extremity/Skin:-Good pulses, chronic right elbow deformity, pale Psych-affect is appropriate, oriented x3 Neuro-no new focal deficits, no tremors GU-Foley catheter to be placed due to urinary retention   Data Review:   Micro Results Recent Results (from the past 240 hour(s))  Urine Culture     Status: Abnormal   Collection Time: 06/16/2018  1:47 PM  Result Value Ref Range Status   Specimen Description URINE, RANDOM  Final   Special Requests   Final    NONE Performed at [facility], [place] [address], [place], [place] [address]    Culture MULTIPLE SPECIES PRESENT, SUGGEST RECOLLECTION (A)  Final   Report Status 06/30/2018 FINAL  Final    Radiology Reports Ct Abdomen Pelvis Wo Contrast  Result Date: 07/01/2018 CLINICAL DATA:  Abdominal distention, pain, nausea, history of severe pyloric stenosis EXAM: CT ABDOMEN AND PELVIS WITHOUT CONTRAST TECHNIQUE: Multidetector CT imaging of the abdomen and pelvis was performed following the standard protocol without IV contrast. COMPARISON:  Chest and abdomen films of 06/09/2017 and CT abdomen pelvis of 01/10/2016 FINDINGS: Lower chest: There are moderate-sized bilateral pleural effusions present with bibasilar linear atelectasis. Moderate cardiomegaly is again noted. No pericardial effusion is seen. Hepatobiliary: The liver is unremarkable in this unenhanced study. Surgical clips are present from prior cholecystectomy. Pancreas: Pancreas appears atrophic. The pancreatic duct is not dilated. Spleen: The spleen is unremarkable with calcified splenic artery noted. Adrenals/Urinary Tract: Adrenal glands appear normal.  No renal calculi are seen and there is no evidence of hydronephrosis. The ureters do not appear to be dilated there are difficult to follow in this patient. The urinary bladder is distended with no abnormality evident. Stomach/Bowel: The stomach is massively distended with fluid and food debris as well as oral contrast within this markedly distended stomach. In this patient with history of severe pyloric stenosis, recurrence of severe pyloric  stenosis is the primary consideration. No definite mass is seen. No small bowel distention is seen. The mucosa of the ano-rectal region is somewhat thickened of questionable significance. Clinical correlation is recommended. The majority of the colon is decompressed and very difficult to assess. The terminal ileum is poorly visualized but the appendix does appear to fill with air and is unremarkable. Vascular/Lymphatic: Moderate abdominal aortic atherosclerosis is present. No aneurysm is noted. There is no evidence of adenopathy. Reproductive: The uterus is somewhat atrophic. No adnexal lesion is seen. No fluid is noted within the pelvis. Other: No abdominal wall hernia is seen. Musculoskeletal: There is anterolisthesis of L4 on L5 by 5 mm and anterolisthesis of L3 on L4 by 8 mm. Both of these malalignments appear to be due to degenerative change of the facet joints. There is considerable degenerative disc disease at both L3-4 and L4-5 levels. No compression deformity is seen. There is mild degenerative joint disease of the hips present. IMPRESSION: 1. Massively distended stomach with food and fluid present. This most likely is due to recurrence of severe pyloric stenosis by history. 2. Circumferential thickening of the a no rectal mucosa of questionable significance. Correlate clinically. 3. Degenerative disc disease at L3-4 and L4-5 as noted above. 4. Moderate-sized bilateral pleural effusions with bibasilar atelectasis. Cardiomegaly. Electronically Signed   By: Ivar Drape  M.D.   On: 07/01/2018 13:28   Dg Abd Acute W/chest  Result Date: 06/25/2018 CLINICAL DATA:  Nausea. EXAM: DG ABDOMEN ACUTE W/ 1V CHEST COMPARISON:  Radiographs of May 14, 2018. FINDINGS: Large amount of stool seen throughout the colon. No abnormal bowel dilatation is noted. No radiopaque calculi or other significant radiographic abnormality is seen. Stable cardiomegaly. Status post cholecystectomy. Atherosclerosis of thoracic aorta is noted. Both lungs are clear. IMPRESSION: Large stool burden is noted. No abnormal bowel dilatation is noted. No acute cardiopulmonary disease. Aortic Atherosclerosis (ICD10-I70.0). Electronically Signed   By: Marijo Conception, M.D.   On: 06/13/2018 13:23   Dg Abd Portable 1v  Result Date: 07/07/2018 CLINICAL DATA:  Patient with history of ileus. EXAM: PORTABLE ABDOMEN - 1 VIEW COMPARISON:  Abdominal radiograph 07/02/2018 FINDINGS: Oral contrast material demonstrated within the colon. Dilated loops of bowel are demonstrated throughout the abdomen. Surgical staple line projects over the mid abdomen. Supine evaluation limited for the detection of free intraperitoneal air. IMPRESSION: Dilated loops of bowel within the abdomen concerning for ileus. Oral contrast material throughout the colon. Electronically Signed   By: Lovey Newcomer M.D.   On: 07/07/2018 10:42   Dg Abd Portable 1v  Result Date: 07/02/2018 CLINICAL DATA:  Insertion of nasogastric tube EXAM: PORTABLE ABDOMEN - 1 VIEW COMPARISON:  Portable exam 1620 hours compared to 06/18/2018 FINDINGS: Nasogastric tube present with tip projecting over mid stomach. Scattered stool throughout colon. No definite bowel wall thickening or obstruction. Bones demineralized. Scattered vascular calcifications. IMPRESSION: Tip of nasogastric tube projects over mid stomach. Electronically Signed   By: Lavonia Dana M.D.   On: 07/02/2018 16:38   Dg Duanne Limerick  W/kub  Result Date: 07/03/2018 CLINICAL DATA:  Gastric outlet obstruction. EXAM: UPPER GI  SERIES WITHOUT KUB TECHNIQUE: Routine upper GI series was performed with thin barium. In this setting obstruction barium would not be usually indicated, but the patient is allergic to iodinated contrast. As much barium as possible was aspirated from the stomach at the termination of the exam (120 cc of barium was used and 450 cc of material was aspirated at the end of  the exam). FLUOROSCOPY TIME:  Fluoroscopy Time:  2 minutes 48 seconds Radiation Exposure Index (if provided by the fluoroscopic device): 13.9 mGy Number of Acquired Spot Images: 0 COMPARISON:  Upper GI 12/31/2017.  Abdominal CT from 2 days ago FINDINGS: Despite nasogastric tube and continued wall section the stomach is still distended with gas and fluid and debris. Mucosal exam is not possible. Dilated stomach with pylorus high in the right upper quadrant. There is focal narrowing at the pyloric level with maximal channel opening of 4 mm. This narrowing is fairly smooth. No extraluminal mass was seen on prior CT is well. IMPRESSION: 1. Focal smooth narrowing at the pylorus with channel measuring 4 mm. 2. Despite suction, the stomach remains distended by fluid/debris. Electronically Signed   By: Monte Fantasia M.D.   On: 07/03/2018 16:37     CBC Recent Labs  Lab 07/05/18 1105 07/06/18 0442 07/07/18 0402  WBC 10.7* 12.7* 6.2  HGB 7.6* 10.2* 8.9*  HCT 22.3* 30.1* 26.6*  PLT 182 149* 154  MCV 109.3* 99.3 101.1*  MCH 37.3* 33.7 33.8  MCHC 34.1 33.9 33.5  RDW 13.2 18.3* 17.4*    Chemistries  Recent Labs  Lab 07/05/18 1105 07/06/18 0843 07/07/18 0402  NA 133* 135 137  K 3.4* 3.4* 4.4  CL 103 105 109  CO2 20* 23 24  GLUCOSE 291* 161* 120*  BUN 16 29* 23  CREATININE 0.86 1.18* 0.71  CALCIUM 7.6* 8.0* 7.9*   ------------------------------------------------------------------------------------------------------------------ No results for input(s): CHOL, HDL, LDLCALC, TRIG, CHOLHDL, LDLDIRECT in the last 72 hours.  Lab Results    Component Value Date   HGBA1C  08/06/2009    5.2 (NOTE) The ADA recommends the following therapeutic goal for glycemic control related to Hgb A1c measurement: Goal of therapy: <6.5 Hgb A1c  Reference: American Diabetes Association: Clinical Practice Recommendations 2010, Diabetes Care, 2010, 33: (Suppl  1).   ------------------------------------------------------------------------------------------------------------------ No results for input(s): TSH, T4TOTAL, T3FREE, THYROIDAB in the last 72 hours.  Invalid input(s): FREET3 ------------------------------------------------------------------------------------------------------------------ No results for input(s): VITAMINB12, FOLATE, FERRITIN, TIBC, IRON, RETICCTPCT in the last 72 hours.  Coagulation profile No results for input(s): INR, PROTIME in the last 168 hours.  No results for input(s): DDIMER in the last 72 hours.  Cardiac Enzymes No results for input(s): CKMB, TROPONINI, MYOGLOBIN in the last 168 hours.  Invalid input(s): CK ------------------------------------------------------------------------------------------------------------------    Component Value Date/Time   BNP 127.6 (H) 06/10/2018 1128   Amenda Duclos M.D on 07/07/2018 at 11:00 AM  Pager---[phone] Go to [email or web address] - password [email or web address] for contact info  [facility] - Office  [phone]

## 2018-07-07 NOTE — Progress Notes (Signed)
Subjective: The patient was seen and examined at bedside. She has not had a bowel movement and had an abdominal x-ray today showed dilated loops of bowel suspicious for ileus. She denies nausea vomiting. She has been started on clear liquid diet, gastrostomy tube is draining bilious fluid,1250 the last 24 hours. She had urinary retention and now has a Foley's catheter placed draining dark yellow urine. She continues to be hypotensive and has required fluid boluses.  Objective: Vital signs in last 24 hours: Temp:  [97.9 F (36.6 C)-98.5 F (36.9 C)] 98.3 F (36.8 C) (09/02 0803) Pulse Rate:  [44-70] 65 (09/02 1138) Resp:  [12-22] 15 (09/02 1138) BP: (71-99)/(35-56) 85/50 (09/02 1138) SpO2:  [94 %-97 %] 97 % (09/02 1138) Weight:  [56.1 kg] 56.1 kg (09/02 0406) Weight change:  Last BM Date: 07/02/18  OZ:HYQMVHQ, frail, thinly built GENERAL:mild pallor, no icterus ABDOMEN:soft, distended, tympanitic to percussion, tenderness and right upper abdomen, jejunostomy feeding currently on hold, gastrostomy tube connected to gravity EXTREMITIES:no deformity Lab Results: Results for orders placed or performed during the hospital encounter of 06/28/2018 (from the past 48 hour(s))  ABO/Rh     Status: None   Collection Time: 07/05/18 12:22 PM  Result Value Ref Range   ABO/RH(D)      O POS Performed at [facility], [place] [address], [place], [place] [address]   Prepare RBC     Status: None   Collection Time: 07/05/18 12:22 PM  Result Value Ref Range   Order Confirmation      ORDER PROCESSED BY BLOOD BANK Performed at [facility], [place] [address], [place], [place] [address]   Type and screen [place]     Status: None   Collection Time: 07/05/18 12:51 PM  Result Value Ref Range   ABO/RH(D) O POS    Antibody Screen NEG    Sample Expiration 07/08/2018    Unit Number [id number]    Blood Component Type RED CELLS,LR    Unit division 00    Status of Unit  ISSUED,FINAL    Transfusion Status OK TO TRANSFUSE    Crossmatch Result      Compatible Performed at [facility], [place] [address] [place], [place] [address]    Unit Number [id number]    Blood Component Type RED CELLS,LR    Unit division 00    Status of Unit ISSUED,FINAL    Transfusion Status OK TO TRANSFUSE    Crossmatch Result Compatible   Glucose, capillary     Status: Abnormal   Collection Time: 07/05/18  5:33 PM  Result Value Ref Range   Glucose-Capillary 157 (H) 70 - 99 mg/dL  Glucose, capillary     Status: Abnormal   Collection Time: 07/05/18  8:09 PM  Result Value Ref Range   Glucose-Capillary 179 (H) 70 - 99 mg/dL   Comment 1 Notify RN    Comment 2 Document in Chart   Glucose, capillary     Status: Abnormal   Collection Time: 07/06/18 12:41 AM  Result Value Ref Range   Glucose-Capillary 169 (H) 70 - 99 mg/dL   Comment 1 Notify RN    Comment 2 Document in Chart   Glucose, capillary     Status: Abnormal   Collection Time: 07/06/18  4:26 AM  Result Value Ref Range   Glucose-Capillary 159 (H) 70 - 99 mg/dL  CBC     Status: Abnormal   Collection Time: 07/06/18  4:42 AM  Result Value Ref Range   WBC 12.7 (H) 4.0 - 10.5 K/uL   RBC 3.03 (L) 3.87 - 5.11 MIL/uL   Hemoglobin 10.2 (L) 12.0 - 15.0 g/dL    Comment: DELTA CHECK NOTED POST TRANSFUSION SPECIMEN    HCT 30.1 (L) 36.0 - 46.0 %   MCV 99.3 78.0 - 100.0 fL    Comment: DELTA CHECK NOTED POST TRANSFUSION SPECIMEN    MCH 33.7 26.0 - 34.0 pg   MCHC 33.9 30.0 - 36.0 g/dL   RDW 18.3 (H) 11.5 - 15.5 %   Platelets 149 (L) 150 - 400 K/uL    Comment: Performed at [facility], [place] [address] [place], [place] [address]  Glucose, capillary     Status: Abnormal   Collection Time: 07/06/18  7:54 AM  Result Value Ref Range   Glucose-Capillary 165 (H) 70 - 99 mg/dL   Comment 1 Document in Chart   Basic metabolic panel     Status: Abnormal   Collection Time: 07/06/18  8:43 AM  Result Value Ref Range    Sodium 135 135 - 145 mmol/L   Potassium 3.4 (L) 3.5 - 5.1 mmol/L   Chloride 105 98 - 111 mmol/L   CO2 23 22 - 32 mmol/L   Glucose, Bld 161 (H) 70 - 99 mg/dL   BUN 29 (H) 8 - 23 mg/dL   Creatinine, Ser 1.18 (H) 0.44 - 1.00 mg/dL   Calcium 8.0 (L) 8.9 - 10.3 mg/dL   GFR calc non Af Amer 43 (L) >60 mL/min   GFR calc Af Amer 49 (L) >60 mL/min    Comment: (NOTE) The eGFR has been calculated using the CKD EPI equation. This calculation has not been validated in all clinical situations. eGFR's persistently <60 mL/min signify possible Chronic Kidney Disease.    Anion gap 7 5 - 15    Comment: Performed at [facility] [address], [place], [place] [address]  Glucose, capillary     Status: Abnormal   Collection Time: 07/06/18 12:01 PM  Result Value Ref Range   Glucose-Capillary 131 (H) 70 - 99 mg/dL  Glucose, capillary     Status: Abnormal   Collection Time: 07/06/18  4:20 PM  Result Value Ref Range   Glucose-Capillary 122 (H) 70 - 99 mg/dL  Glucose, capillary     Status: Abnormal   Collection Time: 07/06/18  8:30 PM  Result Value Ref Range   Glucose-Capillary 127 (H) 70 - 99 mg/dL  Glucose, capillary     Status: Abnormal   Collection Time: 07/07/18 12:19 AM  Result Value Ref Range   Glucose-Capillary 126 (H) 70 - 99 mg/dL  Basic metabolic panel     Status: Abnormal   Collection Time: 07/07/18  4:02 AM  Result Value Ref Range   Sodium 137 135 - 145 mmol/L   Potassium 4.4 3.5 - 5.1 mmol/L   Chloride 109 98 - 111 mmol/L   CO2 24 22 - 32 mmol/L   Glucose, Bld 120 (H) 70 - 99 mg/dL   BUN 23 8 - 23 mg/dL   Creatinine, Ser 0.71 0.44 - 1.00 mg/dL   Calcium 7.9 (L) 8.9 - 10.3 mg/dL   GFR calc non Af Amer >60 >60 mL/min   GFR calc Af Amer >60 >60 mL/min    Comment: (NOTE) The eGFR has been calculated using the CKD EPI equation. This calculation has not been validated in all clinical situations. eGFR's persistently <60 mL/min signify possible Chronic  Kidney Disease.     Anion gap 4 (L) 5 - 15    Comment: Performed at [facility] [address], [place], [place] [address]  CBC     Status: Abnormal   Collection Time: 07/07/18  4:02 AM  Result Value Ref Range   WBC 6.2 4.0 - 10.5 K/uL   RBC 2.63 (L) 3.87 - 5.11 MIL/uL   Hemoglobin 8.9 (L) 12.0 - 15.0 g/dL   HCT 26.6 (L) 36.0 - 46.0 %   MCV 101.1 (H) 78.0 - 100.0 fL   MCH 33.8 26.0 - 34.0 pg   MCHC 33.5 30.0 - 36.0 g/dL   RDW 17.4 (H) 11.5 - 15.5 %   Platelets 154 150 - 400 K/uL    Comment: Performed at [facility], [place] [address] [place], [place] [address]  Glucose, capillary     Status: Abnormal   Collection Time: 07/07/18  4:17 AM  Result Value Ref Range   Glucose-Capillary 108 (H) 70 - 99 mg/dL  Glucose, capillary     Status: Abnormal   Collection Time: 07/07/18  8:02 AM  Result Value Ref Range   Glucose-Capillary 108 (H) 70 - 99 mg/dL    Studies/Results: Dg Abd Portable 1v  Result Date: 07/07/2018 CLINICAL DATA:  Patient with history of ileus. EXAM: PORTABLE ABDOMEN - 1 VIEW COMPARISON:  Abdominal radiograph 07/02/2018 FINDINGS: Oral contrast material demonstrated within the colon. Dilated loops of bowel are demonstrated throughout the abdomen. Surgical staple line projects over the mid abdomen. Supine evaluation limited for the detection of free intraperitoneal air. IMPRESSION: Dilated loops of bowel within the abdomen concerning for ileus. Oral contrast material throughout the colon. Electronically Signed   By: Lovey Newcomer M.D.   On: 07/07/2018 10:42    Medications: I have reviewed the patient's current medications.  Assessment: 1.Post gastrojejunostomy along with venting gastrostomy tube placement and a feeding jejunostomy tube placement for severe pyloric stenosis not responding to EGD with periodic dilatations. Possible postop ileus based on abdominal x-ray 2. Anemia,2 units PRBC transfusion on 07/05/18 3. Hypertension 4. History of paroxysmal atrial  fibrillation  Plan: Started on clear liquid diet with gastrostomy tube connected to gravity for drainage. Patient started on MiraLAX via jejunostomy tube along with the liquid Colace twice a day from J-tube. We will start patient on Dulcolax suppositories. Ideally would encourage her to move around, however because of hypotension and episodes of presyncope, this might not be possible currently. Encouraged to be out of bed to chair, minimize use of narcotics.  [name] 07/07/2018, 12:03 PM   Pager [phone] If no answer or after 5 PM call [phone]

## 2018-07-08 ENCOUNTER — Inpatient Hospital Stay (HOSPITAL_COMMUNITY): Payer: Medicare Other

## 2018-07-08 LAB — GLUCOSE, CAPILLARY
GLUCOSE-CAPILLARY: 112 mg/dL — AB (ref 70–99)
Glucose-Capillary: 106 mg/dL — ABNORMAL HIGH (ref 70–99)
Glucose-Capillary: 108 mg/dL — ABNORMAL HIGH (ref 70–99)
Glucose-Capillary: 116 mg/dL — ABNORMAL HIGH (ref 70–99)
Glucose-Capillary: 124 mg/dL — ABNORMAL HIGH (ref 70–99)
Glucose-Capillary: 139 mg/dL — ABNORMAL HIGH (ref 70–99)
Glucose-Capillary: 87 mg/dL (ref 70–99)

## 2018-07-08 LAB — BASIC METABOLIC PANEL
Anion gap: 5 (ref 5–15)
BUN: 13 mg/dL (ref 8–23)
CHLORIDE: 106 mmol/L (ref 98–111)
CO2: 23 mmol/L (ref 22–32)
Calcium: 7.8 mg/dL — ABNORMAL LOW (ref 8.9–10.3)
Creatinine, Ser: 0.54 mg/dL (ref 0.44–1.00)
GFR calc Af Amer: >60 mL/min (ref >60)
GLUCOSE: 110 mg/dL — AB (ref 70–99)
Potassium: 4.3 mmol/L (ref 3.5–5.1)
Sodium: 134 mmol/L — ABNORMAL LOW (ref 135–145)

## 2018-07-08 LAB — CBC
HCT: 30.5 % — ABNORMAL LOW (ref 36.0–46.0)
Hemoglobin: 10.1 g/dL — ABNORMAL LOW (ref 12.0–15.0)
MCH: 33.9 pg (ref 26.0–34.0)
MCHC: 33.1 g/dL (ref 30.0–36.0)
MCV: 102.3 fL — AB (ref 78.0–100.0)
PLATELETS: 197 10*3/uL (ref 150–400)
RBC: 2.98 MIL/uL — AB (ref 3.87–5.11)
RDW: 16.7 % — ABNORMAL HIGH (ref 11.5–15.5)
WBC: 8.2 10*3/uL (ref 4.0–10.5)

## 2018-07-08 MED ORDER — KCL IN DEXTROSE-NACL 20-5-0.45 MEQ/L-%-% IV SOLN
INTRAVENOUS | Status: DC
  Administered 2018-07-08 – 2018-07-10 (×4): via INTRAVENOUS
  Filled 2018-07-08 (×4): qty 1000

## 2018-07-08 MED ORDER — PROMETHAZINE HCL 25 MG/ML IJ SOLN
12.5000 mg | Freq: Once | INTRAMUSCULAR | Status: AC
  Administered 2018-07-09: 12.5 mg via INTRAVENOUS
  Filled 2018-07-08: qty 1

## 2018-07-08 NOTE — Progress Notes (Signed)
Per pt she is unable to walk and has not walk since this admission, refused to walk as scheduled

## 2018-07-08 NOTE — Plan of Care (Signed)
Pt c/o  nausea gave iv zofran per pt feels better

## 2018-07-08 NOTE — Progress Notes (Signed)
Patient Demographics:    Angela [name], is a 82 y.o. female, DOB - 1936/04/12, PPJ:[id number]  Admit date - 06/10/2018   Admitting Physician [name], MD  Outpatient Primary MD for the patient is [name], MD  LOS - 8   Chief Complaint  Patient presents with  . Failure To Thrive  . Nausea        Subjective:    Angela [name] today has no fevers, gastrostomy tube with over 120 mL output, nausea persist, no emesis, had BM that was semisolid after Dulcolax suppository, Dr. Therisa Doyne from GI at bedside  Assessment  & Plan :    Principal Problem:   Gastric outlet obstruction Active Problems:   Acute blood loss anemia   Cervical spondylosis with myelopathy   Rheumatoid arthritis (HCC)   Hyponatremia   Dyslipidemia   Essential hypertension, benign   Aortic stenosis   CAD (coronary artery disease)   Constipation   PAF (paroxysmal atrial fibrillation) (HCC)   Cardiomyopathy (HCC)   Malnutrition of moderate degree  Brief  Summary:  82 year old with past medical history of severe pyloric stenosis, CAD, atrial fibrillation, hyperlipidemia, osteoporosis, peptic ulcer disease, rheumatoid arthritis came to the hospital with concerns of feeling very nauseous and abdominal discomfort.  In the ER abdominal x-ray showed large stool burden without any other acute pathology.  Sodium was noted to be 128 and potassium 5.9.  She was started on bowel regimen and admitted to the hospital.  With IV fluids her sodium improved to 130 and potassium trended down to 3.9.  UA was suggestive of possible urinary tract infection therefore started on IV Rocephin.  After starting the patient on aggressive bowel regimen she had few bowel movements but her abdomen appears slightly more distended.  CT of the abdomen pelvis showed severe gastric distention due to severe pyloric obstruction, s/p Open gastrojejunostomy, with  gastrostomy andfeedingjejunostomy tubes placement on 06/22/2018, appears to have developed postop ileus   Plan:- 1)Severe Pyloric Stenosis/GOO--- resulting in severe gastric distention and recurrent nausea vomiting, poor oral intake, weight loss and protein caloric malnutrition--- failed multiple attempts at EGD with dilatation, last dilatation was 06/17/2018, GI and surgical consult appreciated ,  On 06/29/2018 she underwent Open gastrojejunal bypass  with gastrojejunostomy, with  Gastrostomy tube for drainage and jejunostomy tube for feeding,        Preop clearance from cardiology appreciated, they recommend no further cardiovascular testing prior to surgery.    Postop patient appears to have developed adynamic ileus, emesis then please hold tube feeds, continue tube feed for now and follow clinically and repeat abdominal imaging as indicated  2)Moderate protein caloric malnutrition-  C/n  Jevity 1.2 formula  via J-tube and increase by 1 at goal rate of 55 ml/hr, c/n water flushes at 90 mL every 4 hours  3)PAFib--- s/p DCCV 05/09/2018, remains in sinus rhythm, stable, c/n amiodarone 100 mg twice daily , c/n acebutolol 200 mg daily (hold paramenters),  hold Eliquis due to concerns that patient may need additional procedures due to postop ileus and abdominal distention, cardiology input/consult appreciated, PTA used propranolol as needed for palpitations, will discontinue at this time due to soft BP  4)H/o CAD--- Bare metal stent apparently around 2003, no ACS type symptoms  at this time, c/n acebutolol 200 mg daily with Hold paramentersand crestor 10 mg via J tube  , cardiology consult appreciated  5)Rheumatoid Arthritis/Spondylolisthesis/Chronic Pain Syndrome---  PTA was prednisone 5 mg daily, received stress dose steroids perioperatively, changed to Orapred 10 mg daily and then plan to discharged on 5 mg when ready for discharge  6)Acute Blood Loss Anemia--- baseline hemoglobin usually around 12,   hemoglobin post-op was 7.6 (baseline 12) , up to 10.1 from 7.6 after transfusion of 2 units of PRBCs on 07/05/18, hgb back down to 8.9 due to IV fluid boluses/hemodilution  7)Acute urinary Retention- failed in and out catheterization,  leave Foley catheter in a.m., c/n  Flomax 0.4 mg, voiding trial in a day or 2  8)Generalized Weakness/Debility--- unable to significantly work with physical therapy occupational therapy at this time due to soft blood pressures, patient will most likely need skilled nursing facility rehab  Disposition/Need for in-Hospital Stay- patient unable to be discharged at this time due to   s/p gastrojejunostomy, with gastrostomy andfeedingjejunostomy tubes placement on 06/13/2018, need to make sure patient tolerates feeding via J tube prior to discharge, postop blood pressures are soft, also has acute urinary retention and generalized weakness will most likely need skilled nursing facility placement for rehab....   Patient has developed postop ileus, may need further surgical intervention  Code Status : full code  Disposition Plan  :  SNF  Consults  :  Gi/Gen surg/cardiology for preop clearance  Procedure-  s/p gastrojejunostomy, with gastrostomy andfeedingjejunostomy tubes placement on 06/29/2018  DVT Prophylaxis  :    SCDs (hold Eliquis to allow for procedures)  Lab Results  Component Value Date   PLT 197 07/08/2018   Inpatient Medications  Scheduled Meds: . sodium chloride   Intravenous Once  . acebutolol  200 mg Per J Tube Daily  . amiodarone  100 mg Per J Tube BID  . bisacodyl  10 mg Rectal BID  . calcium carbonate (dosed in mg elemental calcium)  1,250 mg Per J Tube BID  . cholecalciferol  200 Units Per Tube BID  . diclofenac sodium  2 g Topical QID  . docusate  100 mg Per J Tube BID  . escitalopram  5 mg Per Tube Daily  . fesoterodine  8 mg Oral Daily  . folic acid  1 mg Oral Daily  . lansoprazole  15 mg Per Tube BID  . LORazepam  1 mg Intravenous Once    . LORazepam  2 mg Per Tube QHS  . neomycin-bacitracin-polymyxin  1 application Topical Daily  . polyethylene glycol  17 g Oral Daily  . prednisoLONE  10 mg Per Tube QAC breakfast  . rosuvastatin  10 mg Per J Tube QHS  . sodium chloride flush  3 mL Intravenous Q12H  . tamsulosin  0.4 mg Oral QPC supper  . vitamin B-12  1,000 mcg Per J Tube Daily  . vitamin C  500 mg Per J Tube Daily   Continuous Infusions: . sodium chloride    . dextrose 5 % and 0.45 % NaCl with KCl 20 mEq/L 75 mL/hr at 07/08/18 1109  . feeding supplement (JEVITY 1.2 CAL) Stopped (07/08/18 1050)   PRN Meds:.sodium chloride, acetaminophen (TYLENOL) oral liquid 160 mg/5 mL, morphine injection, nitroGLYCERIN, [DISCONTINUED] ondansetron **OR** ondansetron (ZOFRAN) IV, oxyCODONE-acetaminophen, phenol, propranolol, sodium chloride flush    Anti-infectives (From admission, onward)   Start     Dose/Rate Route Frequency Ordered Stop   06/26/2018 0915  cefoTEtan (CEFOTAN)  1 g in sodium chloride 0.9 % 100 mL IVPB     1 g 200 mL/hr over 30 Minutes Intravenous To Surgery 06/17/2018 0830 06/07/2018 1132   06/30/18 0645  cefTRIAXone (ROCEPHIN) 1 g in sodium chloride 0.9 % 100 mL IVPB  Status:  Discontinued     1 g 200 mL/hr over 30 Minutes Intravenous Every 24 hours 06/30/18 0643 07/02/18 1655   06/28/2018 1500  cefTRIAXone (ROCEPHIN) 1 g in sodium chloride 0.9 % 100 mL IVPB  Status:  Discontinued     1 g 200 mL/hr over 30 Minutes Intravenous Every 24 hours 06/08/2018 1451 06/21/2018 1528        Objective:   Vitals:   07/08/18 0208 07/08/18 0800 07/08/18 0830 07/08/18 1602  BP: (!) 92/55  (!) 95/56   Pulse: 76 77 79   Resp: 20 (!) 21 (!) 23   Temp: 98.6 F (37 C)   98.8 F (37.1 C)  TempSrc: Oral     SpO2: 94% 93% 93%   Weight:      Height:        Wt Readings from Last 3 Encounters:  07/07/18 56.1 kg  06/17/18 48.5 kg  06/03/18 48.9 kg     Intake/Output Summary (Last 24 hours) at 07/08/2018 1828 Last data filed at  07/08/2018 1500 Gross per 24 hour  Intake 1892 ml  Output 240 ml  Net 1652 ml    Physical Exam  Gen:- Awake Alert,  In no apparent distress  HEENT:- [place].AT, No sclera icterus Neck-Supple Neck,No JVD,.  Lungs-  CTAB , good air movement CV- S1, S2 normal Abd-somewhat distended, bowel sounds diminished, G-tube with scant drainage, J-tube noted,  , postop tenderness noted extremity/Skin:-Good pulses, chronic right elbow deformity,  Psych-affect is appropriate, oriented x3 Neuro-no new focal deficits, no tremors GU-Foley catheter   placed due to urinary retention   Data Review:   Micro Results Recent Results (from the past 240 hour(s))  Urine Culture     Status: Abnormal   Collection Time: 06/28/2018  1:47 PM  Result Value Ref Range Status   Specimen Description URINE, RANDOM  Final   Special Requests   Final    NONE Performed at [facility], [place] [address] [place],  [address]    Culture MULTIPLE SPECIES PRESENT, SUGGEST RECOLLECTION (A)  Final   Report Status 06/30/2018 FINAL  Final    Radiology Reports Ct Abdomen Pelvis Wo Contrast  Result Date: 07/01/2018 CLINICAL DATA:  Abdominal distention, pain, nausea, history of severe pyloric stenosis EXAM: CT ABDOMEN AND PELVIS WITHOUT CONTRAST TECHNIQUE: Multidetector CT imaging of the abdomen and pelvis was performed following the standard protocol without IV contrast. COMPARISON:  Chest and abdomen films of 06/09/2017 and CT abdomen pelvis of 01/10/2016 FINDINGS: Lower chest: There are moderate-sized bilateral pleural effusions present with bibasilar linear atelectasis. Moderate cardiomegaly is again noted. No pericardial effusion is seen. Hepatobiliary: The liver is unremarkable in this unenhanced study. Surgical clips are present from prior cholecystectomy. Pancreas: Pancreas appears atrophic. The pancreatic duct is not dilated. Spleen: The spleen is unremarkable with calcified splenic artery noted. Adrenals/Urinary Tract:  Adrenal glands appear normal. No renal calculi are seen and there is no evidence of hydronephrosis. The ureters do not appear to be dilated there are difficult to follow in this patient. The urinary bladder is distended with no abnormality evident. Stomach/Bowel: The stomach is massively distended with fluid and food debris as well as oral contrast within this markedly distended  stomach. In this patient with history of severe pyloric stenosis, recurrence of severe pyloric stenosis is the primary consideration. No definite mass is seen. No small bowel distention is seen. The mucosa of the ano-rectal region is somewhat thickened of questionable significance. Clinical correlation is recommended. The majority of the colon is decompressed and very difficult to assess. The terminal ileum is poorly visualized but the appendix does appear to fill with air and is unremarkable. Vascular/Lymphatic: Moderate abdominal aortic atherosclerosis is present. No aneurysm is noted. There is no evidence of adenopathy. Reproductive: The uterus is somewhat atrophic. No adnexal lesion is seen. No fluid is noted within the pelvis. Other: No abdominal wall hernia is seen. Musculoskeletal: There is anterolisthesis of L4 on L5 by 5 mm and anterolisthesis of L3 on L4 by 8 mm. Both of these malalignments appear to be due to degenerative change of the facet joints. There is considerable degenerative disc disease at both L3-4 and L4-5 levels. No compression deformity is seen. There is mild degenerative joint disease of the hips present. IMPRESSION: 1. Massively distended stomach with food and fluid present. This most likely is due to recurrence of severe pyloric stenosis by history. 2. Circumferential thickening of the a no rectal mucosa of questionable significance. Correlate clinically. 3. Degenerative disc disease at L3-4 and L4-5 as noted above. 4. Moderate-sized bilateral pleural effusions with bibasilar atelectasis. Cardiomegaly.  Electronically Signed   By: Ivar Drape M.D.   On: 07/01/2018 13:28   Dg Abd 1 View  Result Date: 07/08/2018 CLINICAL DATA:  Abdominal pain. EXAM: ABDOMEN - 1 VIEW COMPARISON:  07/07/2018. FINDINGS: Surgical staples are noted over the abdomen. Distended loops of small and large bowel are again noted consistent adynamic ileus. No free air. Oral contrast again noted in the colon. IMPRESSION: Findings again noted consistent with adynamic ileus. No interim change from prior exam. Electronically Signed   By: Marcello Moores  Register   On: 07/08/2018 10:01   Dg Abd Acute W/chest  Result Date: 06/21/2018 CLINICAL DATA:  Nausea. EXAM: DG ABDOMEN ACUTE W/ 1V CHEST COMPARISON:  Radiographs of May 14, 2018. FINDINGS: Large amount of stool seen throughout the colon. No abnormal bowel dilatation is noted. No radiopaque calculi or other significant radiographic abnormality is seen. Stable cardiomegaly. Status post cholecystectomy. Atherosclerosis of thoracic aorta is noted. Both lungs are clear. IMPRESSION: Large stool burden is noted. No abnormal bowel dilatation is noted. No acute cardiopulmonary disease. Aortic Atherosclerosis (ICD10-I70.0). Electronically Signed   By: Marijo Conception, M.D.   On: 06/06/2018 13:23   Dg Abd Portable 1v  Result Date: 07/07/2018 CLINICAL DATA:  Patient with history of ileus. EXAM: PORTABLE ABDOMEN - 1 VIEW COMPARISON:  Abdominal radiograph 07/02/2018 FINDINGS: Oral contrast material demonstrated within the colon. Dilated loops of bowel are demonstrated throughout the abdomen. Surgical staple line projects over the mid abdomen. Supine evaluation limited for the detection of free intraperitoneal air. IMPRESSION: Dilated loops of bowel within the abdomen concerning for ileus. Oral contrast material throughout the colon. Electronically Signed   By: Lovey Newcomer M.D.   On: 07/07/2018 10:42   Dg Abd Portable 1v  Result Date: 07/02/2018 CLINICAL DATA:  Insertion of nasogastric tube EXAM: PORTABLE  ABDOMEN - 1 VIEW COMPARISON:  Portable exam 1620 hours compared to 06/07/2018 FINDINGS: Nasogastric tube present with tip projecting over mid stomach. Scattered stool throughout colon. No definite bowel wall thickening or obstruction. Bones demineralized. Scattered vascular calcifications. IMPRESSION: Tip of nasogastric tube projects over mid stomach. Electronically Signed  By: Lavonia Dana M.D.   On: 07/02/2018 16:38   Dg Duanne Limerick  W/kub  Result Date: 07/03/2018 CLINICAL DATA:  Gastric outlet obstruction. EXAM: UPPER GI SERIES WITHOUT KUB TECHNIQUE: Routine upper GI series was performed with thin barium. In this setting obstruction barium would not be usually indicated, but the patient is allergic to iodinated contrast. As much barium as possible was aspirated from the stomach at the termination of the exam (120 cc of barium was used and 450 cc of material was aspirated at the end of the exam). FLUOROSCOPY TIME:  Fluoroscopy Time:  2 minutes 48 seconds Radiation Exposure Index (if provided by the fluoroscopic device): 13.9 mGy Number of Acquired Spot Images: 0 COMPARISON:  Upper GI 12/31/2017.  Abdominal CT from 2 days ago FINDINGS: Despite nasogastric tube and continued wall section the stomach is still distended with gas and fluid and debris. Mucosal exam is not possible. Dilated stomach with pylorus high in the right upper quadrant. There is focal narrowing at the pyloric level with maximal channel opening of 4 mm. This narrowing is fairly smooth. No extraluminal mass was seen on prior CT is well. IMPRESSION: 1. Focal smooth narrowing at the pylorus with channel measuring 4 mm. 2. Despite suction, the stomach remains distended by fluid/debris. Electronically Signed   By: Monte Fantasia M.D.   On: 07/03/2018 16:37     CBC Recent Labs  Lab 07/05/18 1105 07/06/18 0442 07/07/18 0402 07/08/18 0823  WBC 10.7* 12.7* 6.2 8.2  HGB 7.6* 10.2* 8.9* 10.1*  HCT 22.3* 30.1* 26.6* 30.5*  PLT 182 149* 154 197  MCV  109.3* 99.3 101.1* 102.3*  MCH 37.3* 33.7 33.8 33.9  MCHC 34.1 33.9 33.5 33.1  RDW 13.2 18.3* 17.4* 16.7*    Chemistries  Recent Labs  Lab 07/05/18 1105 07/06/18 0843 07/07/18 0402 07/08/18 0823  NA 133* 135 137 134*  K 3.4* 3.4* 4.4 4.3  CL 103 105 109 106  CO2 20* 23 24 23   GLUCOSE 291* 161* 120* 110*  BUN 16 29* 23 13  CREATININE 0.86 1.18* 0.71 0.54  CALCIUM 7.6* 8.0* 7.9* 7.8*   ------------------------------------------------------------------------------------------------------------------ No results for input(s): CHOL, HDL, LDLCALC, TRIG, CHOLHDL, LDLDIRECT in the last 72 hours.  Lab Results  Component Value Date   HGBA1C  08/06/2009    5.2 (NOTE) The ADA recommends the following therapeutic goal for glycemic control related to Hgb A1c measurement: Goal of therapy: <6.5 Hgb A1c  Reference: American Diabetes Association: Clinical Practice Recommendations 2010, Diabetes Care, 2010, 33: (Suppl  1).   ------------------------------------------------------------------------------------------------------------------ No results for input(s): TSH, T4TOTAL, T3FREE, THYROIDAB in the last 72 hours.  Invalid input(s): FREET3 ------------------------------------------------------------------------------------------------------------------ No results for input(s): VITAMINB12, FOLATE, FERRITIN, TIBC, IRON, RETICCTPCT in the last 72 hours.  Coagulation profile No results for input(s): INR, PROTIME in the last 168 hours.  No results for input(s): DDIMER in the last 72 hours.  Cardiac Enzymes No results for input(s): CKMB, TROPONINI, MYOGLOBIN in the last 168 hours.  Invalid input(s): CK ------------------------------------------------------------------------------------------------------------------    Component Value Date/Time   BNP 127.6 (H) 06/10/2018 1128   Roxan Hockey M.D on 07/08/2018 at 6:28 PM  Pager---[phone] Go to [email or web address] - password [email or web address] for  contact info  [facility] - Office  [phone]

## 2018-07-08 NOTE — Progress Notes (Addendum)
[facility] Progress Note  4 Days Post-Op  Subjective: CC:  C/o slight increase in distention along with severe nausea. Reports one BM yesterday and some flatus overnight. Her brother is at bedside.   Objective: Vital signs in last 24 hours: Temp:  [98.6 F (37 C)-99.7 F (37.6 C)] 98.6 F (37 C) (09/03 0208) Pulse Rate:  [62-76] 76 (09/03 0208) Resp:  [14-22] 20 (09/03 0208) BP: (74-103)/(43-65) 92/55 (09/03 0208) SpO2:  [94 %-98 %] 94 % (09/03 0208) Last BM Date: 07/07/18  Intake/Output from previous day: 09/02 0701 - 09/03 0700 In: 2054.3 [P.O.:120; I.V.:10; NG/GT:1374.3] Out: 240 [Urine:120; Drains:120] Intake/Output this shift: No intake/output data recorded.  PE: Gen:  Alert, NAD, pleasant Pulm:  Normal effort Abd: Soft, appropriately tender, hypoactive BS, moderate distention with tympany, honeycomb removed - midline incision clean w small amt dried sanguinous oozing inferior aspect incision. G tube to gravity with small amount dark bilious drainage. J tube w/ tube feeds at 55 cc/hr GU: foley in place, urine appears dark. Skin: warm and dry, no rashes  Psych: A&Ox3   Lab Results:  Recent Labs    07/07/18 0402 07/08/18 0823  WBC 6.2 8.2  HGB 8.9* 10.1*  HCT 26.6* 30.5*  PLT 154 197   BMET Recent Labs    07/07/18 0402 07/08/18 0823  NA 137 134*  K 4.4 4.3  CL 109 106  CO2 24 23  GLUCOSE 120* 110*  BUN 23 13  CREATININE 0.71 0.54  CALCIUM 7.9* 7.8*   CMP     Component Value Date/Time   NA 134 (L) 07/08/2018 0823   NA 135 06/09/2018 1324   K 4.3 07/08/2018 0823   CL 106 07/08/2018 0823   CO2 23 07/08/2018 0823   GLUCOSE 110 (H) 07/08/2018 0823   BUN 13 07/08/2018 0823   BUN 13 06/09/2018 1324   CREATININE 0.54 07/08/2018 0823   CREATININE 0.60 05/25/2016 1252   CALCIUM 7.8 (L) 07/08/2018 0823   PROT 4.9 (L) 06/30/2018 1148   ALBUMIN 3.4 (L) 06/28/2018 1148   AST 65 (H) 07/05/2018 1148   ALT 32 06/21/2018 1148   ALKPHOS 28  (L) 06/19/2018 1148   BILITOT 2.2 (H) 06/08/2018 1148   GFRNONAA >60 07/08/2018 0823   GFRAA >60 07/08/2018 0823   Lipase     Component Value Date/Time   LIPASE 34 06/22/2018 1148   Studies/Results: Dg Abd Portable 1v  Result Date: 07/07/2018 CLINICAL DATA:  Patient with history of ileus. EXAM: PORTABLE ABDOMEN - 1 VIEW COMPARISON:  Abdominal radiograph 07/02/2018 FINDINGS: Oral contrast material demonstrated within the colon. Dilated loops of bowel are demonstrated throughout the abdomen. Surgical staple line projects over the mid abdomen. Supine evaluation limited for the detection of free intraperitoneal air. IMPRESSION: Dilated loops of bowel within the abdomen concerning for ileus. Oral contrast material throughout the colon. Electronically Signed   By: Lovey Newcomer M.D.   On: 07/07/2018 10:42    Anti-infectives: Anti-infectives (From admission, onward)   Start     Dose/Rate Route Frequency Ordered Stop   06/14/2018 0915  cefoTEtan (CEFOTAN) 1 g in sodium chloride 0.9 % 100 mL IVPB     1 g 200 mL/hr over 30 Minutes Intravenous To Surgery 06/28/2018 0830 07/01/2018 1132   06/30/18 0645  cefTRIAXone (ROCEPHIN) 1 g in sodium chloride 0.9 % 100 mL IVPB  Status:  Discontinued     1 g 200 mL/hr over 30 Minutes Intravenous Every 24 hours 06/30/18 0643 07/02/18 1655  06/22/2018 1500  cefTRIAXone (ROCEPHIN) 1 g in sodium chloride 0.9 % 100 mL IVPB  Status:  Discontinued     1 g 200 mL/hr over 30 Minutes Intravenous Every 24 hours 07/02/2018 1451 06/19/2018 1528     Assessment/Plan CAD s/p stent in 2008, not on ASA due to frequent ulcers SVT HTN Atrial fibrillation PUD  Gastroparesis and GOO Rheumatoid arthritis/Spondylolisthesis/Chronic pain syndrome Osteoporosis UTI - on IV rocephin  Constipation   Gastric outlet obstruction secondary to severe pyloric stenosis - EGD with bx 8/13 did not show malignancy, pyloric stenosis most likely due to PUD - past balloon dilations 01/10/18, 01/24/18,  04/15/18, 06/17/18 - all by Dr. Therisa Doyne  POD#4 S/P Opengastrojejunostomy, with gastrostomy andfeedingjejunostomy tubes - 06/09/2018 - Dr. Georgette Dover -  Post-operative ileus -  Continuegastrostomy to straight drain, patient may have limited sips/ice -  Hold J-tube feeds today- flush every 4 hours and before/after medication use. Liquid meds per tube preferred, meds can be finely crushed and administered if necessary. -  OOB to chair   LOS: 8 days     Obie Dredge, [facility] Pager: [phone]

## 2018-07-08 NOTE — Progress Notes (Signed)
Subjective: Patient was seen and examined at bedside. Patient reports having a moderate liquid and semi-formed bowel movement overnight. Feeding from jejunostomy tube was started thereafter at night.She complains of mild nausea yesterday evening, there have been no episodes of vomiting. The gastrostomy tube has drained about 120 mL of bilious fluid in the last 24 hours.   Objective: Vital signs in last 24 hours: Temp:  [98.6 F (37 C)-99.7 F (37.6 C)] 98.6 F (37 C) (09/03 0208) Pulse Rate:  [44-76] 76 (09/03 0208) Resp:  [14-22] 20 (09/03 0208) BP: (71-103)/(35-65) 92/55 (09/03 0208) SpO2:  [94 %-98 %] 94 % (09/03 0208) Weight change:  Last BM Date: 07/07/18  YY:TKPTWS built,Frail, elderly, not in acute distress, lying on bed GENERAL:mild pallor, dry oral mucosa ABDOMEN:more distended than yesterday, tympanic to percussion, mild generalized tenderness, worse in left upper abdomen and around the surgical incision site, feeding through the jejunostomy while gastrostomy tube has been placed for drainage,hypoactive bowel sounds EXTREMITIES:compression boots on both lower extremities, no deformity  Lab Results: Results for orders placed or performed during the hospital encounter of 06/26/2018 (from the past 48 hour(s))  Glucose, capillary     Status: Abnormal   Collection Time: 07/06/18 12:01 PM  Result Value Ref Range   Glucose-Capillary 131 (H) 70 - 99 mg/dL  Glucose, capillary     Status: Abnormal   Collection Time: 07/06/18  4:20 PM  Result Value Ref Range   Glucose-Capillary 122 (H) 70 - 99 mg/dL  Glucose, capillary     Status: Abnormal   Collection Time: 07/06/18  8:30 PM  Result Value Ref Range   Glucose-Capillary 127 (H) 70 - 99 mg/dL  Glucose, capillary     Status: Abnormal   Collection Time: 07/07/18 12:19 AM  Result Value Ref Range   Glucose-Capillary 126 (H) 70 - 99 mg/dL  Basic metabolic panel     Status: Abnormal   Collection Time: 07/07/18  4:02 AM  Result Value  Ref Range   Sodium 137 135 - 145 mmol/L   Potassium 4.4 3.5 - 5.1 mmol/L   Chloride 109 98 - 111 mmol/L   CO2 24 22 - 32 mmol/L   Glucose, Bld 120 (H) 70 - 99 mg/dL   BUN 23 8 - 23 mg/dL   Creatinine, Ser 0.71 0.44 - 1.00 mg/dL   Calcium 7.9 (L) 8.9 - 10.3 mg/dL   GFR calc non Af Amer >60 >60 mL/min   GFR calc Af Amer >60 >60 mL/min    Comment: (NOTE) The eGFR has been calculated using the CKD EPI equation. This calculation has not been validated in all clinical situations. eGFR's persistently <60 mL/min signify possible Chronic Kidney Disease.    Anion gap 4 (L) 5 - 15    Comment: Performed at [facility] [address], [place], [place] [address]  CBC     Status: Abnormal   Collection Time: 07/07/18  4:02 AM  Result Value Ref Range   WBC 6.2 4.0 - 10.5 K/uL   RBC 2.63 (L) 3.87 - 5.11 MIL/uL   Hemoglobin 8.9 (L) 12.0 - 15.0 g/dL   HCT 26.6 (L) 36.0 - 46.0 %   MCV 101.1 (H) 78.0 - 100.0 fL   MCH 33.8 26.0 - 34.0 pg   MCHC 33.5 30.0 - 36.0 g/dL   RDW 17.4 (H) 11.5 - 15.5 %   Platelets 154 150 - 400 K/uL    Comment: Performed at [facility], [place] [address],  [place], [place] [address]  Glucose, capillary     Status: Abnormal   Collection Time: 07/07/18  4:17 AM  Result Value Ref Range   Glucose-Capillary 108 (H) 70 - 99 mg/dL  Glucose, capillary     Status: Abnormal   Collection Time: 07/07/18  8:02 AM  Result Value Ref Range   Glucose-Capillary 108 (H) 70 - 99 mg/dL  Glucose, capillary     Status: None   Collection Time: 07/07/18 12:13 PM  Result Value Ref Range   Glucose-Capillary 96 70 - 99 mg/dL  Glucose, capillary     Status: None   Collection Time: 07/07/18  4:57 PM  Result Value Ref Range   Glucose-Capillary 97 70 - 99 mg/dL  Glucose, capillary     Status: Abnormal   Collection Time: 07/07/18  8:01 PM  Result Value Ref Range   Glucose-Capillary 101 (H) 70 - 99 mg/dL  Glucose, capillary     Status: None   Collection Time: 07/08/18 12:00 AM   Result Value Ref Range   Glucose-Capillary 87 70 - 99 mg/dL  Glucose, capillary     Status: Abnormal   Collection Time: 07/08/18  4:14 AM  Result Value Ref Range   Glucose-Capillary 116 (H) 70 - 99 mg/dL  CBC     Status: Abnormal   Collection Time: 07/08/18  8:23 AM  Result Value Ref Range   WBC 8.2 4.0 - 10.5 K/uL   RBC 2.98 (L) 3.87 - 5.11 MIL/uL   Hemoglobin 10.1 (L) 12.0 - 15.0 g/dL   HCT 30.5 (L) 36.0 - 46.0 %   MCV 102.3 (H) 78.0 - 100.0 fL   MCH 33.9 26.0 - 34.0 pg   MCHC 33.1 30.0 - 36.0 g/dL   RDW 16.7 (H) 11.5 - 15.5 %   Platelets 197 150 - 400 K/uL    Comment: Performed at [facility], [place]. [address], [place], [place] [address]  Glucose, capillary     Status: Abnormal   Collection Time: 07/08/18  8:25 AM  Result Value Ref Range   Glucose-Capillary 106 (H) 70 - 99 mg/dL    Studies/Results: Dg Abd Portable 1v  Result Date: 07/07/2018 CLINICAL DATA:  Patient with history of ileus. EXAM: PORTABLE ABDOMEN - 1 VIEW COMPARISON:  Abdominal radiograph 07/02/2018 FINDINGS: Oral contrast material demonstrated within the colon. Dilated loops of bowel are demonstrated throughout the abdomen. Surgical staple line projects over the mid abdomen. Supine evaluation limited for the detection of free intraperitoneal air. IMPRESSION: Dilated loops of bowel within the abdomen concerning for ileus. Oral contrast material throughout the colon. Electronically Signed   By: Lovey Newcomer M.D.   On: 07/07/2018 10:42    Medications: I have reviewed the patient's current medications.  Assessment: 1.Possible postoperative ileus following open gastrojejunostomy for severe pyloric stenosis not responding to EGD and dilatation. Ongoing feeding from jejunostomy tube, patient on clear liquid diet while gastrostomy tube has been placed for venting and drainage  2.Hypotension  3.Anemia, hemoglobin 10.1 today,was 8.9 yesterday, last transfusion was 2 units PRBC on 07/05/18  4.History of paroxysmal  atrial fibrillation   Plan: Abdominal x-ray today to evaluate small bowel dilatation. If there is worsening in dilatation or ileus, recommend holding feeding from jejunostomy tube. Otherwise, continue feeding from jejunostomy tube along with Dulcolax suppository 10 mg twice a day,Colace 100 mg twice a day and MiraLAX 17 g once a day via J-tube. Discussed the same with the patient and her hospitalist Dr.Emokpae.   [name]  07/08/2018, 8:50 AM   Pager [phone] If no answer or after 5 PM call [phone]

## 2018-07-09 ENCOUNTER — Other Ambulatory Visit: Payer: Self-pay | Admitting: *Deleted

## 2018-07-09 ENCOUNTER — Inpatient Hospital Stay (HOSPITAL_COMMUNITY): Payer: Medicare Other

## 2018-07-09 ENCOUNTER — Encounter: Payer: Self-pay | Admitting: *Deleted

## 2018-07-09 DIAGNOSIS — R338 Other retention of urine: Secondary | ICD-10-CM

## 2018-07-09 DIAGNOSIS — K560 Paralytic ileus: Secondary | ICD-10-CM

## 2018-07-09 LAB — GLUCOSE, CAPILLARY: Glucose-Capillary: 85 mg/dL (ref 70–99)

## 2018-07-09 MED ORDER — DEXTROSE-NACL 5-0.9 % IV SOLN: INTRAVENOUS | Status: DC

## 2018-07-09 MED ORDER — JEVITY 1.2 CAL PO LIQD
1000.0000 mL | ORAL | Status: DC
  Filled 2018-07-09: qty 1000

## 2018-07-09 MED ORDER — DEXTROSE-NACL 5-0.9 % IV SOLN
INTRAVENOUS | Status: DC
  Administered 2018-07-09: 13:00:00 via INTRAVENOUS

## 2018-07-09 NOTE — Care Management Important Message (Signed)
Important Message  Patient Details  Name: Angela [name] MRN: [id number] Date of Birth: [birth year]   Medicare Important Message Given:  Yes    Corydon Schweiss 07/09/2018, 1:41 PM

## 2018-07-09 NOTE — Patient Outreach (Signed)
[facility]) Care Management  07/09/2018  Angela [name] 30-Apr-1936 [id number]   CSW will perform a case closure on patient, as patient has been hospitalized for greater than 10 days.  A new referral will be placed for patient once patient is discharged from the hospital, and if social work needs still exist.  CSW will notify patient's RNCM with [facility], Angela [name] of CSW's plans to close patient's case.  CSW will also notify Angela [name], Pharmacist, also with [facility].  CSW will fax an update to patient's Primary Care Physician, Angela [name] to ensure that they are aware of CSW's involvement with patient's plan of care.  Angela [name], BSW, MSW, LCSW  Licensed Education officer, [facility]  [place] [address], [place], [place] [address] Physical Address-[address], [place], [place] [address] Toll Free Main # [phone] Fax # [phone] Cell # [phone]  Office # [phone] [email or web address]

## 2018-07-09 NOTE — Progress Notes (Signed)
PROGRESS NOTE  Angela [name] WJX:[id number] DOB: [birth year] DOA: 06/22/2018 PCP: [name], MD  Assessment/Plan Postoperative adynamic ileus following open gastrojejunostomy for severe pyloric stenosis thought secondary to peptic ulcer disease not responsive to EGD and dilatation. --Hold jejunostomy feeds per GI continue suppository, Colace, MiraLAX. --IV fluids  Urinary retention --continue foley catheter  Paroxysmal atrial fibrillation --apixaban on hold --continue amiodarone, propranolol  Moderate protein calorie malnutrition   Consult PT  DVT prophylaxis: SCDs Code Status: Full Family Communication: none Disposition Plan: pending    Murray Hodgkins, MD  [facility] Direct contact: [phone] --Via amion app OR  --[email or web address]; password [email or web address]  7PM-7AM contact night coverage as above 07/09/2018, 1:15 PM  LOS: 9 days   Consultants:  GI  General surgery   Procedures:    Antimicrobials:    Interval history/Subjective: Feels a little better.  Objective: Vitals:  Vitals:   07/09/18 0645 07/09/18 1200  BP:  103/74  Pulse:  72  Resp:  18  Temp: 98.1 F (36.7 C)   SpO2:  97%    Exam:  Constitutional:  . Appears calm and comfortable; weak, debilitated  Eyes:  . pupils and irises appear normal . Normal lids  ENMT:  . grossly normal hearing  Respiratory:  . CTA bilaterally, no w/r/r.  . Respiratory effort normal. Cardiovascular:  . RRR, no m/r/g . No LE extremity edema   Abdomen:  . Distended in appearance Psychiatric:  . Mental status o Mood, affect appropriate  I have personally reviewed the following:   Labs:  CBG stable  No labs today  Imaging studies:  AXR postop adynamic ileus   Scheduled Meds: . sodium chloride   Intravenous Once  . acebutolol  200 mg Per J Tube Daily  . amiodarone  100 mg Per J Tube BID  . bisacodyl  10 mg Rectal BID  . calcium carbonate (dosed in mg elemental calcium)  1,250 mg Per J  Tube BID  . cholecalciferol  200 Units Per Tube BID  . diclofenac sodium  2 g Topical QID  . docusate  100 mg Per J Tube BID  . escitalopram  5 mg Per Tube Daily  . fesoterodine  8 mg Oral Daily  . folic acid  1 mg Oral Daily  . lansoprazole  15 mg Per Tube BID  . LORazepam  1 mg Intravenous Once  . LORazepam  2 mg Per Tube QHS  . neomycin-bacitracin-polymyxin  1 application Topical Daily  . polyethylene glycol  17 g Oral Daily  . prednisoLONE  10 mg Per Tube QAC breakfast  . rosuvastatin  10 mg Per J Tube QHS  . sodium chloride flush  3 mL Intravenous Q12H  . tamsulosin  0.4 mg Oral QPC supper  . vitamin B-12  1,000 mcg Per J Tube Daily  . vitamin C  500 mg Per J Tube Daily   Continuous Infusions: . sodium chloride    . dextrose 5 % and 0.45 % NaCl with KCl 20 mEq/L 75 mL/hr at 07/09/18 0600  . dextrose 5 % and 0.9% NaCl 75 mL/hr at 07/09/18 1242  . feeding supplement (JEVITY 1.2 CAL) Stopped (07/09/18 1044)    Principal Problem:   Gastric outlet obstruction Active Problems:   Cervical spondylosis with myelopathy   Rheumatoid arthritis (HCC)   Hyponatremia   Dyslipidemia   Essential hypertension, benign   Aortic stenosis   CAD (coronary artery disease)   Constipation   PAF (paroxysmal atrial fibrillation) ([facility])   Cardiomyopathy (  [facility])   Malnutrition of moderate degree   Acute blood loss anemia   LOS: 9 days

## 2018-07-09 NOTE — Discharge Instructions (Signed)
[facility]      [facility] [phone]  OPEN ABDOMINAL SURGERY: POST OP INSTRUCTIONS  Always review your discharge instruction sheet given to you by the facility where your surgery was performed.  IF YOU HAVE DISABILITY OR FAMILY LEAVE FORMS, YOU MUST BRING THEM TO THE OFFICE FOR PROCESSING.  PLEASE DO NOT GIVE THEM TO YOUR DOCTOR.  1. A prescription for pain medication may be given to you upon discharge.  Take your pain medication as prescribed, if needed.  If narcotic pain medicine is not needed, then you may take acetaminophen (Tylenol) or ibuprofen (Advil) as needed. 2. Take your usually prescribed medications unless otherwise directed. 3. If you need a refill on your pain medication, please contact your pharmacy. They will contact our office to request authorization.  Prescriptions will not be filled after 5pm or on week-ends. 4. You should follow a light diet the first few days after arrival home, such as soup and crackers, pudding, etc.unless your doctor has advised otherwise. A high-fiber, low fat diet can be resumed as tolerated.   Be sure to include lots of fluids daily. Most patients will experience some swelling and bruising on the chest and neck area.  Ice packs will help.  Swelling and bruising can take several days to resolve 5. Most patients will experience some swelling and bruising in the area of the incision. Ice pack will help. Swelling and bruising can take several days to resolve..  6. It is common to experience some constipation if taking pain medication after surgery.  Increasing fluid intake and taking a stool softener will usually help or prevent this problem from occurring.  A mild laxative (Milk of Magnesia or Miralax) should be taken according to package directions if there are no bowel movements after 48 hours. 7.  You may have steri-strips (small skin tapes) in place directly over the incision.  These strips should be left on the skin for 7-10 days.  If your  surgeon used skin glue on the incision, you may shower in 24 hours.  The glue will flake off over the next 2-3 weeks.  Any sutures or staples will be removed at the office during your follow-up visit. You may find that a light gauze bandage over your incision may keep your staples from being rubbed or pulled. You may shower and replace the bandage daily. 8. ACTIVITIES:  You may resume regular (light) daily activities beginning the next day--such as daily self-care, walking, climbing stairs--gradually increasing activities as tolerated.  You may have sexual intercourse when it is comfortable.  Refrain from any heavy lifting or straining until approved by your doctor. a. You may drive when you no longer are taking prescription pain medication, you can comfortably wear a seatbelt, and you can safely maneuver your car and apply brakes  9. You should see your doctor in the office for a follow-up appointment approximately two weeks after your surgery.  Make sure that you call for this appointment within a day or two after you arrive home to insure a convenient appointment time.  WHEN TO CALL YOUR DOCTOR: 1. Fever over 101.0 2. Inability to urinate 3. Nausea and/or vomiting 4. Extreme swelling or bruising 5. Continued bleeding from incision. 6. Increased pain, redness, or drainage from the incision. 7. Difficulty swallowing or breathing 8. Muscle cramping or spasms. 9. Numbness or tingling in hands or feet or around lips.  The clinic staff is available to answer your questions during regular business hours.  Please   don't hesitate to call and ask to speak to one of the nurses if you have concerns.  For further questions, please visit [email or web address]   

## 2018-07-09 NOTE — Progress Notes (Addendum)
Subjective: Patient was seen and examined aside. Reports improvement in nausea from yesterday, has not had a bowel movement since the day before, obtained of generalized abdominal tenderness and worsening distention, has been passing flatus.  Objective: Vital signs in last 24 hours: Temp:  [97.8 F (36.6 C)-98.8 F (37.1 C)] 98.1 F (36.7 C) (09/04 0645) Pulse Rate:  [70-76] 70 (09/04 0027) Resp:  [15-23] 15 (09/04 0027) BP: (100-112)/(63) 100/63 (09/04 0027) SpO2:  [94 %-97 %] 94 % (09/04 0027) Weight change:  Last BM Date: 07/07/18  PE:Thinly built, pale, elderly GENERAL:mild pallor, no icterus, dry oral mucosa ABDOMEN:is slightly more distended than yesterday, sluggish hypoactive bowel sounds, mild generalized tenderness-worsen upper abdomen and around the surgical incision, no rigidity or guarding EXTREMITIES:deformity in upper extremities from rheumatoid arthritis, no edema  Lab Results: Results for orders placed or performed during the hospital encounter of 07/01/2018 (from the past 48 hour(s))  Glucose, capillary     Status: None   Collection Time: 07/07/18 12:13 PM  Result Value Ref Range   Glucose-Capillary 96 70 - 99 mg/dL  Glucose, capillary     Status: None   Collection Time: 07/07/18  4:57 PM  Result Value Ref Range   Glucose-Capillary 97 70 - 99 mg/dL  Glucose, capillary     Status: Abnormal   Collection Time: 07/07/18  8:01 PM  Result Value Ref Range   Glucose-Capillary 101 (H) 70 - 99 mg/dL  Glucose, capillary     Status: None   Collection Time: 07/08/18 12:00 AM  Result Value Ref Range   Glucose-Capillary 87 70 - 99 mg/dL  Glucose, capillary     Status: Abnormal   Collection Time: 07/08/18  4:14 AM  Result Value Ref Range   Glucose-Capillary 116 (H) 70 - 99 mg/dL  CBC     Status: Abnormal   Collection Time: 07/08/18  8:23 AM  Result Value Ref Range   WBC 8.2 4.0 - 10.5 K/uL   RBC 2.98 (L) 3.87 - 5.11 MIL/uL   Hemoglobin 10.1 (L) 12.0 - 15.0 g/dL   HCT  30.5 (L) 36.0 - 46.0 %   MCV 102.3 (H) 78.0 - 100.0 fL   MCH 33.9 26.0 - 34.0 pg   MCHC 33.1 30.0 - 36.0 g/dL   RDW 16.7 (H) 11.5 - 15.5 %   Platelets 197 150 - 400 K/uL    Comment: Performed at [facility], [address] [place], [place] [address]  Basic metabolic panel     Status: Abnormal   Collection Time: 07/08/18  8:23 AM  Result Value Ref Range   Sodium 134 (L) 135 - 145 mmol/L   Potassium 4.3 3.5 - 5.1 mmol/L   Chloride 106 98 - 111 mmol/L   CO2 23 22 - 32 mmol/L   Glucose, Bld 110 (H) 70 - 99 mg/dL   BUN 13 8 - 23 mg/dL   Creatinine, Ser 0.54 0.44 - 1.00 mg/dL   Calcium 7.8 (L) 8.9 - 10.3 mg/dL   GFR calc non Af Amer >60 >60 mL/min   GFR calc Af Amer >60 >60 mL/min    Comment: (NOTE) The eGFR has been calculated using the CKD EPI equation. This calculation has not been validated in all clinical situations. eGFR's persistently <60 mL/min signify possible Chronic Kidney Disease.    Anion gap 5 5 - 15    Comment: Performed at [facility] [address] [place], [place] [address]  Glucose, capillary  Status: Abnormal   Collection Time: 07/08/18  8:25 AM  Result Value Ref Range   Glucose-Capillary 106 (H) 70 - 99 mg/dL  Glucose, capillary     Status: Abnormal   Collection Time: 07/08/18 12:26 PM  Result Value Ref Range   Glucose-Capillary 139 (H) 70 - 99 mg/dL  Glucose, capillary     Status: Abnormal   Collection Time: 07/08/18  4:05 PM  Result Value Ref Range   Glucose-Capillary 124 (H) 70 - 99 mg/dL  Glucose, capillary     Status: Abnormal   Collection Time: 07/08/18  8:08 PM  Result Value Ref Range   Glucose-Capillary 108 (H) 70 - 99 mg/dL  Glucose, capillary     Status: Abnormal   Collection Time: 07/08/18  8:20 PM  Result Value Ref Range   Glucose-Capillary 112 (H) 70 - 99 mg/dL  Glucose, capillary     Status: None   Collection Time: 07/09/18  8:14 AM  Result Value Ref Range   Glucose-Capillary 85 70 - 99 mg/dL     Studies/Results: Dg Abd 1 View  Result Date: 07/08/2018 CLINICAL DATA:  Abdominal pain. EXAM: ABDOMEN - 1 VIEW COMPARISON:  07/07/2018. FINDINGS: Surgical staples are noted over the abdomen. Distended loops of small and large bowel are again noted consistent adynamic ileus. No free air. Oral contrast again noted in the colon. IMPRESSION: Findings again noted consistent with adynamic ileus. No interim change from prior exam. Electronically Signed   By: Marcello Moores  Register   On: 07/08/2018 10:01   Dg Abd Portable 1v  Result Date: 07/07/2018 CLINICAL DATA:  Patient with history of ileus. EXAM: PORTABLE ABDOMEN - 1 VIEW COMPARISON:  Abdominal radiograph 07/02/2018 FINDINGS: Oral contrast material demonstrated within the colon. Dilated loops of bowel are demonstrated throughout the abdomen. Surgical staple line projects over the mid abdomen. Supine evaluation limited for the detection of free intraperitoneal air. IMPRESSION: Dilated loops of bowel within the abdomen concerning for ileus. Oral contrast material throughout the colon. Electronically Signed   By: Lovey Newcomer M.D.   On: 07/07/2018 10:42    Medications: I have reviewed the patient's current medications.  Assessment: 1. Post op ileus following open gastrojejunostomy with severe pyloric stenosis not responding to EGD with dilatation. 2. Gastrostomy tube placement for venting and drainage. 3. Jejunostomy tube placed for feeding purposes-feeding on hold currently due to ileus 4. Multiple other co-morbidities -paroxysmal atrial fibrillation, coronary artery disease, rheumatoid arthritis, hypertension  Plan: Abdominal x-ray STAT at bedside, if worsening of ileus noted, continue holding tube feedings. Physical therapy consult, encourage early mobilization, at least out of bed to chair,minimize use of narcotics. Increase IV fluid if jejunostomy feeding is going to be on hold.  ADDENDUM: Axr compatible with post op adynamic ileus. Recommend  hold jejunostomy feedings, except meds and flushes.  Continue dulcolax suppository, colace and miralax meanwhile. Encourage early mobilization, at least OOB to chair, physical therapy consult requested. Will benefit from changing IVF to D5NS instead of NS, blood sugar reported at 81 mg/dl in am.    [name] 07/09/2018, 9:16 AM   Pager [phone] If no answer or after 5 PM call [phone]

## 2018-07-09 NOTE — Progress Notes (Signed)
[facility]/Trauma Progress Note  5 Days Post-Op   Assessment/Plan CAD s/p stent in 2008, not on ASA due to frequent ulcers SVT HTN Atrial fibrillation PUD  Gastroparesis and GOO Rheumatoid arthritis/Spondylolisthesis/Chronic pain syndrome Osteoporosis UTI - on IV rocephin  Constipation   Gastric outlet obstruction secondary to severe pyloric stenosis - EGD with bx 8/13 did not show malignancy, pyloric stenosis most likely due to PUD - past balloon dilations 01/10/18, 01/24/18, 04/15/18, 06/17/18 - all by Dr. Therisa Doyne  POD#5 S/P Opengastrojejunostomy, with gastrostomy andfeedingjejunostomy tubes - 06/06/2018 - Dr. Georgette Dover -  Post-operative ileus, having flatus -  Continuegastrostomy to straight drain, patient may have limited sips/ice -  resume trickle J-tube feeds today- flush every 4 hours and before/after medication use. Liquid meds per tube preferred, meds can be finely crushed and administered if necessary.  FEN: TF VTE: SCD's, heparin okay from a surgical standpoint ID: pre-op only Foley: yes for urinary retention, per medicine Follow up: Dr. Georgette Dover   DISPO: resume trickle TF, PT/OT pending, pt needs to ambulate    LOS: 9 days    Subjective: CC: abdominal bloating and pain  Pt states nausea all day yesterday. No nausea today. She feels bloated. Pain in her left abdomen that radiates into her right side. She is having flatus. No BM. No family at bedside.   Objective: Vital signs in last 24 hours: Temp:  [97.8 F (36.6 C)-98.8 F (37.1 C)] 98.1 F (36.7 C) (09/04 0645) Pulse Rate:  [70-79] 70 (09/04 0027) Resp:  [15-23] 15 (09/04 0027) BP: (95-112)/(56-63) 100/63 (09/04 0027) SpO2:  [93 %-97 %] 94 % (09/04 0027) Last BM Date: 07/07/18  Intake/Output from previous day: 09/03 0701 - 09/04 0700 In: 1504.9 [P.O.:240; I.V.:1264.9] Out: 2100 [Urine:2100] Intake/Output this shift: No intake/output data recorded.  PE: Gen:  Alert, NAD, pleasant Pulm:   Normal effort Abd: Soft, appropriately tender, hypoactive BS, mild distention, midline incision clean. G tube to gravity. J tube in place. Tube sites are well appearing without drainage Skin: warm and dry, no rashes  Psych: A&Ox3   Anti-infectives: Anti-infectives (From admission, onward)   Start     Dose/Rate Route Frequency Ordered Stop   06/29/2018 0915  cefoTEtan (CEFOTAN) 1 g in sodium chloride 0.9 % 100 mL IVPB     1 g 200 mL/hr over 30 Minutes Intravenous To Surgery 06/15/2018 0830 06/16/2018 1132   06/30/18 0645  cefTRIAXone (ROCEPHIN) 1 g in sodium chloride 0.9 % 100 mL IVPB  Status:  Discontinued     1 g 200 mL/hr over 30 Minutes Intravenous Every 24 hours 06/30/18 0643 07/02/18 1655   06/30/2018 1500  cefTRIAXone (ROCEPHIN) 1 g in sodium chloride 0.9 % 100 mL IVPB  Status:  Discontinued     1 g 200 mL/hr over 30 Minutes Intravenous Every 24 hours 06/06/2018 1451 06/17/2018 1528      Lab Results:  Recent Labs    07/07/18 0402 07/08/18 0823  WBC 6.2 8.2  HGB 8.9* 10.1*  HCT 26.6* 30.5*  PLT 154 197   BMET Recent Labs    07/07/18 0402 07/08/18 0823  NA 137 134*  K 4.4 4.3  CL 109 106  CO2 24 23  GLUCOSE 120* 110*  BUN 23 13  CREATININE 0.71 0.54  CALCIUM 7.9* 7.8*   PT/INR No results for input(s): LABPROT, INR in the last 72 hours. CMP     Component Value Date/Time   NA 134 (L) 07/08/2018 0823   NA 135 06/09/2018  1324   K 4.3 07/08/2018 0823   CL 106 07/08/2018 0823   CO2 23 07/08/2018 0823   GLUCOSE 110 (H) 07/08/2018 0823   BUN 13 07/08/2018 0823   BUN 13 06/09/2018 1324   CREATININE 0.54 07/08/2018 0823   CREATININE 0.60 05/25/2016 1252   CALCIUM 7.8 (L) 07/08/2018 0823   PROT 4.9 (L) 06/27/2018 1148   ALBUMIN 3.4 (L) 06/07/2018 1148   AST 65 (H) 06/24/2018 1148   ALT 32 06/07/2018 1148   ALKPHOS 28 (L) 06/28/2018 1148   BILITOT 2.2 (H) 07/02/2018 1148   GFRNONAA >60 07/08/2018 0823   GFRAA >60 07/08/2018 0823   Lipase     Component Value  Date/Time   LIPASE 34 06/05/2018 1148    Studies/Results: Dg Abd 1 View  Result Date: 07/08/2018 CLINICAL DATA:  Abdominal pain. EXAM: ABDOMEN - 1 VIEW COMPARISON:  07/07/2018. FINDINGS: Surgical staples are noted over the abdomen. Distended loops of small and large bowel are again noted consistent adynamic ileus. No free air. Oral contrast again noted in the colon. IMPRESSION: Findings again noted consistent with adynamic ileus. No interim change from prior exam. Electronically Signed   By: Marcello Moores  Register   On: 07/08/2018 10:01   Dg Abd Portable 1v  Result Date: 07/07/2018 CLINICAL DATA:  Patient with history of ileus. EXAM: PORTABLE ABDOMEN - 1 VIEW COMPARISON:  Abdominal radiograph 07/02/2018 FINDINGS: Oral contrast material demonstrated within the colon. Dilated loops of bowel are demonstrated throughout the abdomen. Surgical staple line projects over the mid abdomen. Supine evaluation limited for the detection of free intraperitoneal air. IMPRESSION: Dilated loops of bowel within the abdomen concerning for ileus. Oral contrast material throughout the colon. Electronically Signed   By: Lovey Newcomer M.D.   On: 07/07/2018 10:42      Kalman Drape , [facility] 07/09/2018, 8:24 AM  Pager: [phone] Mon-Wed, Friday 7:00am-4:30pm Thurs 7am-11:30am  Consults: [phone]

## 2018-07-09 NOTE — Evaluation (Signed)
Physical Therapy Evaluation Patient Details Name: Angela [name] MRN: [id number] DOB: May 06, 1936 Today's Date: 07/09/2018   History of Present Illness  82 year old with past medical history of severe pyloric stenosis, CAD, atrial fibrillation, hyperlipidemia, osteoporosis, peptic ulcer disease, rheumatoid arthritis admitted with abdominal pain.  CT of the abdomen pelvis showed severe gastric distention due to severe pyloric obstruction, s/p Open gastrojejunostomy, with gastrostomy and feeding jejunostomy tubes placement on 06/23/2018  Clinical Impression  Patient presents with decreased independence with mobility due to deficits listed in PT problem list.  She will benefit from skilled PT in the acute setting to allow return home with intermittent caregiver assist following SNF level rehab stay.  Currently +2  A for bed to chair transfers, max A for side to sit.  She tolerated sitting and moving to chair with binder on and BP stable.    Orthostatic VS for the past 24 hrs (Last 3 readings):  BP- Lying Pulse- Lying BP- Sitting Pulse- Sitting  07/09/18 1100 109/71 71 110/54 77      Follow Up Recommendations SNF;Supervision/Assistance - 24 hour    Equipment Recommendations  Other (comment)(TBA)    Recommendations for Other Services       Precautions / Restrictions Precautions Precautions: Fall Required Braces or Orthoses: Other Brace/Splint Other Brace/Splint: abdominal binder Restrictions Weight Bearing Restrictions: No      Mobility  Bed Mobility Overal bed mobility: Needs Assistance Bed Mobility: Rolling;Sidelying to Sit Rolling: Mod assist Sidelying to sit: Max assist;HOB elevated       General bed mobility comments: assist to roll to don binder, assist for legs off bed and trunk upright  Transfers Overall transfer level: Needs assistance   Transfers: Sit to/from Stand;Stand Pivot Transfers Sit to Stand: Max assist;+2 safety/equipment Stand pivot transfers: Mod assist;+2  physical assistance       General transfer comment: assist to lift from EOB and to step to chair, assist to scoot back in chair  Ambulation/Gait                Stairs            Wheelchair Mobility    Modified Rankin (Stroke Patients Only)       Balance Overall balance assessment: Needs assistance   Sitting balance-Leahy Scale: Poor Sitting balance - Comments: initially assist for balance, then with S after feet placed on floor     Standing balance-Leahy Scale: Poor Standing balance comment: +2 A for balance/safety                             Pertinent Vitals/Pain Pain Assessment: 0-10 Pain Score: 7  Pain Location: across abdomen  Pain Descriptors / Indicators: Operative site guarding;Grimacing;Guarding Pain Intervention(s): Repositioned;Monitored during session    Home Living Family/patient expects to be discharged to:: Private residence Living Arrangements: Alone Available Help at Discharge: Available PRN/intermittently;Personal care attendant Type of Home: House Home Access: Elevator     Home Layout: One level Home Equipment: Environmental consultant - 2 wheels;Shower seat;Bedside commode      Prior Function Level of Independence: Needs assistance   Gait / Transfers Assistance Needed: walked with RW on her own, 3 falls in 6 months  ADL's / Homemaking Assistance Needed: can do light meal prep (barely) caregiver 3 hours a day 4 days a week        Hand Dominance   Dominant Hand: Right    Extremity/Trunk Assessment   Upper Extremity Assessment Upper  Extremity Assessment: RUE deficits/detail;LUE deficits/detail RUE Deficits / Details: AAROM limited elbow extension reports old fracture poorly healed; strength elbow flexion 3+/5, extension 3/5, grip weak LUE Deficits / Details: AAROM shoulder flexion 95-100, strength elbow flexion 4-/5, extension 3/5, shoulder flexion 3-/5, grip weak    Lower Extremity Assessment Lower Extremity Assessment: RLE  deficits/detail;LLE deficits/detail;Defer to PT evaluation RLE Deficits / Details: AAROM limited ankle mobility with history of fusion, strength hip flexion 2/5, knee extension 3+/5 LLE Deficits / Details: AAROM limited ankle mobility with history of fusion, strength hip flexion 2/5, knee extension 3-/5    Cervical / Trunk Assessment Cervical / Trunk Assessment: Kyphotic  Communication   Communication: Other (comment)(weak voice)  Cognition Arousal/Alertness: Awake/alert Behavior During Therapy: WFL for tasks assessed/performed Overall Cognitive Status: Within Functional Limits for tasks assessed                                        General Comments      Exercises Other Exercises Other Exercises: AAROM shoulders and hips/knees x 3 reps for mobility   Assessment/Plan    PT Assessment Patient needs continued PT services  PT Problem List Decreased strength;Decreased mobility;Decreased balance;Decreased knowledge of use of DME;Pain;Decreased activity tolerance;Decreased knowledge of precautions       PT Treatment Interventions DME instruction;Therapeutic activities;Gait training;Therapeutic exercise;Patient/family education;Balance training;Functional mobility training    PT Goals (Current goals can be found in the Care Plan section)  Acute Rehab PT Goals Patient Stated Goal: agreeable to rehab at [facility] PT Goal Formulation: With patient Time For Goal Achievement: 07/23/18 Potential to Achieve Goals: Good    Frequency Min 3X/week   Barriers to discharge        Co-evaluation               AM-PAC PT "6 Clicks" Daily Activity  Outcome Measure Difficulty turning over in bed (including adjusting bedclothes, sheets and blankets)?: Unable Difficulty moving from lying on back to sitting on the side of the bed? : Unable Difficulty sitting down on and standing up from a chair with arms (e.g., wheelchair, bedside commode, etc,.)?: Unable Help needed moving  to and from a bed to chair (including a wheelchair)?: A Lot Help needed walking in hospital room?: Total Help needed climbing 3-5 steps with a railing? : Total 6 Click Score: 7    End of Session Equipment Utilized During Treatment: Other (comment)(binder) Activity Tolerance: Patient limited by fatigue Patient left: with call bell/phone within reach;in chair;with chair alarm set Nurse Communication: Mobility status PT Visit Diagnosis: Other abnormalities of gait and mobility (R26.89);Muscle weakness (generalized) (M62.81)    Time: 1683-7290 PT Time Calculation (min) (ACUTE ONLY): 41 min   Charges:   PT Evaluation $PT Eval Moderate Complexity: 1 Mod PT Treatments $Therapeutic Activity: 8-22 mins        [place], [place] [phone] 07/09/2018   Reginia Naas 07/09/2018, 1:39 PM

## 2018-07-10 ENCOUNTER — Inpatient Hospital Stay (HOSPITAL_COMMUNITY): Payer: Medicare Other

## 2018-07-10 ENCOUNTER — Inpatient Hospital Stay: Payer: Self-pay

## 2018-07-10 ENCOUNTER — Ambulatory Visit: Payer: Medicare Other | Admitting: *Deleted

## 2018-07-10 LAB — BLOOD GAS, ARTERIAL
Acid-base deficit: 8.1 mmol/L — ABNORMAL HIGH (ref 0.0–2.0)
Bicarbonate: 16 mmol/L — ABNORMAL LOW (ref 20.0–28.0)
Drawn by: 42624
FIO2: 0.4
O2 Saturation: 88.9 %
PH ART: 7.383 (ref 7.350–7.450)
PO2 ART: 56.5 mmHg — AB (ref 83.0–108.0)
Patient temperature: 98.6
pCO2 arterial: 27.5 mmHg — ABNORMAL LOW (ref 32.0–48.0)

## 2018-07-10 LAB — BASIC METABOLIC PANEL
Anion gap: 8 (ref 5–15)
BUN: 14 mg/dL (ref 8–23)
CALCIUM: 7.9 mg/dL — AB (ref 8.9–10.3)
CO2: 19 mmol/L — AB (ref 22–32)
Chloride: 105 mmol/L (ref 98–111)
Creatinine, Ser: 0.46 mg/dL (ref 0.44–1.00)
GFR calc Af Amer: >60 mL/min (ref >60)
Glucose, Bld: 122 mg/dL — ABNORMAL HIGH (ref 70–99)
Potassium: 4.4 mmol/L (ref 3.5–5.1)
Sodium: 132 mmol/L — ABNORMAL LOW (ref 135–145)

## 2018-07-10 LAB — TROPONIN I: Troponin I: <0.03 ng/mL (ref <0.03)

## 2018-07-10 LAB — CBC
HCT: 33.5 % — ABNORMAL LOW (ref 36.0–46.0)
HEMOGLOBIN: 10.9 g/dL — AB (ref 12.0–15.0)
MCH: 33.3 pg (ref 26.0–34.0)
MCHC: 32.5 g/dL (ref 30.0–36.0)
MCV: 102.4 fL — ABNORMAL HIGH (ref 78.0–100.0)
Platelets: 210 10*3/uL (ref 150–400)
RBC: 3.27 MIL/uL — AB (ref 3.87–5.11)
RDW: 15.8 % — AB (ref 11.5–15.5)
WBC: 3.8 10*3/uL — AB (ref 4.0–10.5)

## 2018-07-10 LAB — PREPARE RBC (CROSSMATCH)

## 2018-07-10 LAB — GLUCOSE, CAPILLARY: Glucose-Capillary: 120 mg/dL — ABNORMAL HIGH (ref 70–99)

## 2018-07-10 LAB — PHOSPHORUS: Phosphorus: 3.3 mg/dL (ref 2.5–4.6)

## 2018-07-10 LAB — MAGNESIUM: Magnesium: 1.8 mg/dL (ref 1.7–2.4)

## 2018-07-10 MED ORDER — CHOLECALCIFEROL 400 UNIT/ML PO LIQD
400.0000 [IU] | Freq: Every day | ORAL | Status: DC
  Administered 2018-07-10: 400 [IU]
  Filled 2018-07-10: qty 1

## 2018-07-10 MED ORDER — MAGNESIUM SULFATE 2 GM/50ML IV SOLN
2.0000 g | Freq: Once | INTRAVENOUS | Status: AC
  Administered 2018-07-10: 2 g via INTRAVENOUS
  Filled 2018-07-10: qty 50

## 2018-07-10 MED ORDER — SODIUM CHLORIDE 0.9% IV SOLUTION: Freq: Once | INTRAVENOUS | Status: DC

## 2018-07-10 MED ORDER — MORPHINE SULFATE (PF) 2 MG/ML IV SOLN
2.0000 mg | INTRAVENOUS | Status: DC | PRN
  Administered 2018-07-11 – 2018-07-13 (×6): 2 mg via INTRAVENOUS
  Filled 2018-07-10 (×7): qty 1

## 2018-07-10 MED ORDER — POTASSIUM CHLORIDE IN NACL 20-0.9 MEQ/L-% IV SOLN
INTRAVENOUS | Status: DC
  Filled 2018-07-10 (×2): qty 1000

## 2018-07-10 MED ORDER — PHENYLEPHRINE HCL-NACL 10-0.9 MG/250ML-% IV SOLN
0.0000 ug/min | INTRAVENOUS | Status: DC
  Administered 2018-07-10: 20 ug/min via INTRAVENOUS
  Administered 2018-07-11: 150 ug/min via INTRAVENOUS
  Filled 2018-07-10 (×2): qty 250

## 2018-07-10 MED ORDER — DIATRIZOATE MEGLUMINE & SODIUM 66-10 % PO SOLN
90.0000 mL | Freq: Once | ORAL | Status: AC
  Administered 2018-07-10: 90 mL via JEJUNOSTOMY
  Filled 2018-07-10: qty 90

## 2018-07-10 MED ORDER — HYDROCORTISONE NA SUCCINATE PF 100 MG IJ SOLR
50.0000 mg | Freq: Four times a day (QID) | INTRAMUSCULAR | Status: DC
  Administered 2018-07-10 – 2018-07-14 (×15): 50 mg via INTRAVENOUS
  Filled 2018-07-10 (×15): qty 2

## 2018-07-10 MED ORDER — SODIUM CHLORIDE 0.9 % IV SOLN
3.0000 g | Freq: Three times a day (TID) | INTRAVENOUS | Status: DC
  Administered 2018-07-10 – 2018-07-11 (×2): 3 g via INTRAVENOUS
  Filled 2018-07-10 (×3): qty 3

## 2018-07-10 MED ORDER — OXYCODONE-ACETAMINOPHEN 5-325 MG PO TABS: 1.0000 | ORAL_TABLET | Freq: Four times a day (QID) | ORAL | Status: DC | PRN

## 2018-07-10 MED ORDER — FAMOTIDINE IN NACL 20-0.9 MG/50ML-% IV SOLN
20.0000 mg | Freq: Two times a day (BID) | INTRAVENOUS | Status: DC
  Administered 2018-07-10 – 2018-07-11 (×2): 20 mg via INTRAVENOUS
  Filled 2018-07-10 (×2): qty 50

## 2018-07-10 MED ORDER — ACETAMINOPHEN 10 MG/ML IV SOLN
1000.0000 mg | Freq: Four times a day (QID) | INTRAVENOUS | Status: AC | PRN
  Administered 2018-07-10: 1000 mg via INTRAVENOUS
  Filled 2018-07-10 (×2): qty 100

## 2018-07-10 MED ORDER — INSULIN ASPART 100 UNIT/ML ~~LOC~~ SOLN
0.0000 [IU] | Freq: Four times a day (QID) | SUBCUTANEOUS | Status: DC
  Administered 2018-07-11: 2 [IU] via SUBCUTANEOUS
  Administered 2018-07-11: 1 [IU] via SUBCUTANEOUS
  Administered 2018-07-11: 3 [IU] via SUBCUTANEOUS
  Administered 2018-07-11: 1 [IU] via SUBCUTANEOUS
  Administered 2018-07-12 – 2018-07-13 (×5): 3 [IU] via SUBCUTANEOUS
  Administered 2018-07-13 – 2018-07-14 (×3): 2 [IU] via SUBCUTANEOUS

## 2018-07-10 MED ORDER — SODIUM BICARBONATE 8.4 % IV SOLN
100.0000 meq | Freq: Once | INTRAVENOUS | Status: AC
  Administered 2018-07-10: 100 meq via INTRAVENOUS
  Filled 2018-07-10: qty 50

## 2018-07-10 MED ORDER — TRAVASOL 10 % IV SOLN
INTRAVENOUS | Status: AC
  Administered 2018-07-10: 18:00:00 via INTRAVENOUS
  Filled 2018-07-10: qty 352.8

## 2018-07-10 MED ORDER — POLYETHYLENE GLYCOL 3350 17 G PO PACK
17.0000 g | PACK | Freq: Every day | ORAL | Status: DC
  Administered 2018-07-10: 17 g via JEJUNOSTOMY
  Filled 2018-07-10: qty 1

## 2018-07-10 MED ORDER — SODIUM CHLORIDE 0.9% FLUSH: 10.0000 mL | INTRAVENOUS | Status: DC | PRN

## 2018-07-10 MED ORDER — SODIUM BICARBONATE 8.4 % IV SOLN
INTRAVENOUS | Status: AC
  Filled 2018-07-10: qty 50

## 2018-07-10 MED ORDER — SODIUM CHLORIDE 0.9 % IV BOLUS
1000.0000 mL | Freq: Once | INTRAVENOUS | Status: AC
  Administered 2018-07-10: 1000 mL via INTRAVENOUS

## 2018-07-10 MED ORDER — KCL IN DEXTROSE-NACL 20-5-0.9 MEQ/L-%-% IV SOLN
INTRAVENOUS | Status: DC
  Administered 2018-07-10: 12:00:00 via INTRAVENOUS
  Filled 2018-07-10: qty 1000

## 2018-07-10 NOTE — Consult Note (Signed)
Angela [name]  GYI:[id number] DOB: [birth year] DOA: 07/01/2018 PCP: [name], MD    LOS: 10 days   Reason for Consult / Chief Complaint:  Hypotension  Consulting MD and date of consult:  Dr. Sarajane Jews 07/10/2018  HPI/summary of hospital stay:  82 year old female with PMH significant for PUD with resultant severe pyloric stenosis with prior dilation attempts by EGD (4 since March, last EGD 8/13 which did not show malignancy) with ongoing weight loss (>25lbs over several months), and malnutrition with prior recommendations for a Jtube, PAF, HLD, HTN, CAD, and RA admitted 8/25 to [facility] for worsening nausea, increasing weakness, constipation, hyponatremia and dehydration. She was treated with ceftriaxone for UTI. She remained severely constipated despite medical management and CT abd showed severe gastric distention due to severe pyloric obstruction.  Surgery and GI was consulted.  She was decompressed with an NGT. Additional attempts at dilation felt likely not to help given her chronic gastric outlet obstruction.  Cardiology consulted for pre-op clearance and on 8/30, she underwent a gastrojejunostomy with placement of gastrostomy and jejunostomy tube.  She has required transfusions for ABLA, last transfusion 8/31, and foley catheter for urinary retention.  She was tolerated feeding via Jtube but developed postoperative ileus and therefore tubefeedings held on 9/4. TPN started 9/5.  Patient having worsening abdominal distention and pain with increased output from Gtube.  Developed hypotension and acute bleeding from around Gtube in the evening of 9/5 requiring transfer to ICU for further stabilization.  PCCM consulted to resume medical management while in ICU.   Subjective  Patient reports her SOB is much better and c/o of LLQ pain  Assessment & Plan:  Hypotension- likely hypovolemic shock related to high output from [place] today, with concern for possible hemorraghic shock from bleeding around  Gtube - 1.2L of bilious output manually withdrawn by surgery and separate ~1850 ml output documented today - s/p 1L NS on floor - currently, still fluid responsive  P:  tx to ICU Currently receiving 2nd unit of emergency released PRBC  Trend CVP- currently 4 Additional 1L LR now  Consider albumin for further volume resusitation Assess PCT, coags, cortisol (on chronic steroids) Consider stress dose steroids Will need to match Gtube output going forward  monitor UOP/ renal function  R/o ABLA  - leaking from Gtube site with movement/ turning - s/p pursestring sitch by surgery to help with bleeding from around Gtube; bleeding mostly noted from site with movement/ rolling,? Concern for internal bleeding P:  CBC after 2nd unit of PRBC, then Trend H/H q 6 Consider repeat CT abd when stable   Postoperative adynamic ileus s/p open gastrojejunostomy for gastric obstruction 2/2 severe pyloric stenosis r/t PUD that has not been responsive to dilation and EGD - apparently had clear/ brown tinged liquid BM with lots of flatulence upon transfer to ICU  P:  Per Surgery and GI Gtube to suction Goal K > 4.5; Mag > 2  Hypoxia - concern for aspiration - acute onset with hypotension on floor with sats in the 80's on NRB, currently now on VM - likely secondary to pain and abd distended, sats improved after manual decompression by surgery with 1.2L out Gtube P:  Monitor Continue to wean supplemental O2 for sats > 92% CXR now  Hold sedating meds  Checking PCT, will start empiric aspiration coverage with unasyn for now  PAF  P:  - holding apixaban (last dose 8/28) - hold PO amiodarone and propranolol while hemodynamically  unstable   Protein Calorie Malnutrition - held for ~1 hour while on floor prior to tx to ICU P:  Resume TPN Hourly CBG and PRN to monitor/ prevent hypoglycemia CMET in am   Urinary Retention - s/p catheter placement P:  Monitor UOP Bladder scan / flush as needed    Rheumatoid Arthritis  P:  on chronic prednisone, ?methotrexate   Best Practice / Goals of Care / Disposition.   DVT prophylaxis: SCDs GI prophylaxis: pepcid  Diet: NPO/ TPN Mobility: bedrest Code Status: Full  Family Communication: patient and her brother updated at bedside on plan of care  Disposition / Summary of Today's Plan 07/10/18   tx to ICU for further stabilization   Consultants: date of consult/date signed off (if applicable)/final recs  Cards- for preop clearance- signed off  Surgery and GI following   Procedures: 8/30 open gastrojejunostomy with gastrostomy and jejunostomy tube placement  Significant Diagnostic Tests: 8/27 CT abd/ pelvis >> 1. Massively distended stomach with food and fluid present. This most likely is due to recurrence of severe pyloric stenosis by history. 2. Circumferential thickening of the a no rectal mucosa of questionable significance. Correlate clinically. 3. Degenerative disc disease at L3-4 and L4-5 as noted above. 4. Moderate-sized bilateral pleural effusions with bibasilar atelectasis. Cardiomegaly.  9/5 AXR >> 1. Continued massive dilatation of the stomach. 2. Increased dilatation of small bowel loops. 3. No free intraperitoneal air.  Micro Data: 8/25 UC  >> multiple species 9/5 BCx 2 >> 9/5 MRSA PCR >>  Antimicrobials:  8/25 Ceftriaxone >> 8/27 8/30 Cefotetan x 1 preop 9/5 Unasyn >>  Objective    Examination: General:  Elderly, fraile appearing female lying in bed in NAD HEENT: MM pink/dry, no JVD, pupils 3/reactive, anicteric  Neuro: Alert, oriented x 3, MAE CV:  rrr, no m/r/g PULM: even/non-labored, tachypneic, lungs bilaterally clear anteriorly, right posterior crackles GI: distended, taut, Gtube with some bright red bleeding from site after rolling, Jtube site wnl, stapled midline abd incision w/d/i  Extremities: warm/dry, anasarca  Skin: no rashes   Blood pressure (!) 88/57, pulse 94, temperature 98.2 F (36.8  C), temperature source Oral, resp. rate (!) 22, height 5\' 1"  (1.549 m), weight 64.4 kg, SpO2 94 %.       Intake/Output Summary (Last 24 hours) at 07/10/2018 2028 Last data filed at 07/10/2018 1940 Gross per 24 hour  Intake 1715.58 ml  Output 4300 ml  Net -2584.42 ml   Filed Weights   07/07/18 0406 07/09/18 1700 07/10/18 0541  Weight: 56.1 kg 62.8 kg 64.4 kg     Labs    CBC: Recent Labs  Lab 07/05/18 1105 07/06/18 0442 07/07/18 0402 07/08/18 0823 07/10/18 1827  WBC 10.7* 12.7* 6.2 8.2 3.8*  HGB 7.6* 10.2* 8.9* 10.1* 10.9*  HCT 22.3* 30.1* 26.6* 30.5* 33.5*  MCV 109.3* 99.3 101.1* 102.3* 102.4*  PLT 182 149* 154 197 086   Basic Metabolic Panel: Recent Labs  Lab 07/05/18 1105 07/06/18 0843 07/07/18 0402 07/08/18 0823 07/10/18 0406  NA 133* 135 137 134* 132*  K 3.4* 3.4* 4.4 4.3 4.4  CL 103 105 109 106 105  CO2 20* 23 24 23  19*  GLUCOSE 291* 161* 120* 110* 122*  BUN 16 29* 23 13 14   CREATININE 0.86 1.18* 0.71 0.54 0.46  CALCIUM 7.6* 8.0* 7.9* 7.8* 7.9*  MG  --   --   --   --  1.8  PHOS  --   --   --   --  3.3   GFR: Estimated Creatinine Clearance: 49 mL/min (by C-G formula based on SCr of 0.46 mg/dL). Recent Labs  Lab 07/06/18 0442 07/07/18 0402 07/08/18 0823 07/10/18 1827  WBC 12.7* 6.2 8.2 3.8*   Liver Function Tests: No results for input(s): AST, ALT, ALKPHOS, BILITOT, PROT, ALBUMIN in the last 168 hours. No results for input(s): LIPASE, AMYLASE in the last 168 hours. No results for input(s): AMMONIA in the last 168 hours. ABG    Component Value Date/Time   PHART 7.613 (HH) 06/17/2013 1128   PCO2ART 24.3 (L) 06/17/2013 1128   PO2ART 132.0 (H) 06/17/2013 1128   HCO3 24.6 (H) 06/17/2013 1128   TCO2 25 06/17/2013 1128   ACIDBASEDEF 7.0 (H) 06/16/2013 1103   O2SAT 100.0 06/17/2013 1128    Coagulation Profile: No results for input(s): INR, PROTIME in the last 168 hours. Cardiac Enzymes: Recent Labs  Lab 07/10/18 1849  TROPONINI <0.03    HbA1C: Hgb A1c MFr Bld  Date/Time Value Ref Range Status  08/06/2009 10:53 PM  4.6 - 6.1 % Final   5.2 (NOTE) The ADA recommends the following therapeutic goal for glycemic control related to Hgb A1c measurement: Goal of therapy: <6.5 Hgb A1c  Reference: American Diabetes Association: Clinical Practice Recommendations 2010, Diabetes Care, 2010, 33: (Suppl  1).   CBG: Recent Labs  Lab 07/08/18 1226 07/08/18 1605 07/08/18 2008 07/08/18 2020 07/09/18 0814  GLUCAP 139* 124* 108* 112* 85     Review of Systems:   POSITIVES IN BOLD Gen: Denies fever, chills, weight loss, fatigue, night sweats HEENT: Denies vision changes, sore throat, dysphagia PULM: Denies shortness of breath, cough, sputum production, hemoptysis, wheezing CV: Denies chest pain, edema, orthopnea, palpitations GI: Denies abdominal pain, nausea, vomiting, diarrhea, hematochezia, melena, constipation GU: Denies dysuria, hematuria Neuro: Denies headache, numbness, weakness, slurred speech, loss of consciousness  Past medical history  She,  has a past medical history of A-fib ([place]), Acute renal failure ([place]) (06/15/2013), Aortic stenosis, Asthma, Atrial fibrillation (HCC), Atrial flutter ([place]) (01/16/2016), CAD (coronary artery disease), Cardiomyopathy ([place]) (01/30/2016), Cervical stenosis of spine, Chronic pain syndrome (12/02/2017), Depression, Duodenitis (01/16/2016), Dyslipidemia, Elbow fracture, right, Essential hypertension, benign (11/03/2013), Gastric outlet obstruction (01/06/2016), Gastroparesis (01/06/2016), GERD (gastroesophageal reflux disease), Headache, Hypertension, Hyponatremia (06/15/2013), Impaired physical mobility, Intestinal polyp, Macular degeneration, Obesity, Osteoporosis, PUD (peptic ulcer disease), PVC's (premature ventricular contractions), RA (rheumatoid arthritis) ([place]), Rheumatoid arthritis(714.0), Spondylolisthesis of lumbar region, and SVT (supraventricular tachycardia) ([place]) (01/16/2016).   Surgical  History    Past Surgical History:  Procedure Laterality Date  . ANKLE FUSION Bilateral    x2 both  . BALLOON DILATION N/A 01/10/2018   Procedure: BALLOON DILATION;  Surgeon: [name], MD;  Location: [place];  Service: Gastroenterology;  Laterality: N/A;  . BALLOON DILATION N/A 01/24/2018   Procedure: Larrie Kass DILATION;  Surgeon: [name], MD;  Location: [place];  Service: Gastroenterology;  Laterality: N/A;  W/ Fluoroscopy. Please use colonoscope.  Marland Kitchen BALLOON DILATION N/A 04/15/2018   Procedure: BALLOON DILATION;  Surgeon: [name], MD;  Location[facility];  Service: Gastroenterology;  Laterality: N/A;  . BALLOON DILATION N/A 06/17/2018   Procedure: BALLOON DILATION;  Surgeon: [name], MD;  Location: [facility];  Service: Gastroenterology;  Laterality: N/A;  . BIOPSY  06/17/2018   Procedure: BIOPSY;  Surgeon: [name], MD;  Location: [facility];  Service: Gastroenterology;;  . CARDIAC CATHETERIZATION  09/2012   patent LAD stent and otherwise normal coronary arteries  . CARDIAC CATHETERIZATION N/A 03/07/2016   Procedure:  Left Heart Cath and Coronary Angiography;  Surgeon: Peter M Martinique, MD;  Location: [place] [facility];  Service: Cardiovascular;  Laterality: N/A;  . CARDIOVERSION N/A 03/13/2018   Procedure: CARDIOVERSION;  Surgeon: Lelon Perla, MD;  Location: [facility] ENDOSCOPY;  Service: Cardiovascular;  Laterality: N/A;  . CARDIOVERSION N/A 05/09/2018   Procedure: CARDIOVERSION;  Surgeon: Pixie Casino, MD;  Location: [facility] ENDOSCOPY;  Service: Cardiovascular;  Laterality: N/A;  . CATARACT EXTRACTION, BILATERAL Bilateral   . CERVICAL FUSION     limited  ROM  . CHOLECYSTECTOMY    . ESOPHAGOGASTRODUODENOSCOPY N/A 01/07/2016   Procedure: ESOPHAGOGASTRODUODENOSCOPY (EGD);  Surgeon: Wilford Corner, MD;  Location: [facility];  Service: Endoscopy;  Laterality: N/A;  . ESOPHAGOGASTRODUODENOSCOPY N/A 01/10/2018   Procedure: ESOPHAGOGASTRODUODENOSCOPY (EGD);  Surgeon:  [name], MD;  Location: [place];  Service: Gastroenterology;  Laterality: N/A;  . ESOPHAGOGASTRODUODENOSCOPY N/A 01/24/2018   Procedure: ESOPHAGOGASTRODUODENOSCOPY (EGD);  Surgeon: [name], MD;  Location: [place];  Service: Gastroenterology;  Laterality: N/A;  . ESOPHAGOGASTRODUODENOSCOPY N/A 04/15/2018   Procedure: ESOPHAGOGASTRODUODENOSCOPY (EGD);  Surgeon: [name], MD;  Location: [facility];  Service: Gastroenterology;  Laterality: N/A;  . ESOPHAGOGASTRODUODENOSCOPY N/A 06/17/2018   Procedure: ESOPHAGOGASTRODUODENOSCOPY (EGD);  Surgeon: [name], MD;  Location: [facility];  Service: Gastroenterology;  Laterality: N/A;  . ESOPHAGOGASTRODUODENOSCOPY (EGD) WITH PROPOFOL N/A 11/01/2015   Procedure: ESOPHAGOGASTRODUODENOSCOPY (EGD) WITH PROPOFOL w/ Dialtation;  Surgeon: Garlan Fair, MD;  Location: [facility];  Service: Endoscopy;  Laterality: N/A;  . FRACTURE SURGERY  09/2011   left ankle screw and pin removed  . GASTROJEJUNOSTOMY N/A 06/05/2018   Procedure: GASTROJEJUNOSTOMY;  Surgeon: Donnie Mesa, MD;  Location: [place];  Service: General;  Laterality: N/A;  . GASTROSTOMY N/A 06/18/2018   Procedure: INSERTION OF GASTROSTOMY TUBE AND JEJUNOSTOMY TUBE;  Surgeon: Donnie Mesa, MD;  Location: [place];  Service: General;  Laterality: N/A;  . HAND SURGERY Bilateral    x4 digits 2-5 both hands  . JOINT REPLACEMENT Bilateral    BTKA  . TONSILLECTOMY       Social History   Social History   Socioeconomic History  . Marital status: Widowed    Spouse name: Not on file  . Number of children: Not on file  . Years of education: Not on file  . Highest education level: Not on file  Occupational History  . Not on file  Social Needs  . Financial resource strain: Not on file  . Food insecurity:    Worry: Not on file    Inability: Not on file  . Transportation needs:    Medical: Not on file    Non-medical: Not on file  Tobacco Use  . Smoking status: Never Smoker    . Smokeless tobacco: Never Used  Substance and Sexual Activity  . Alcohol use: No  . Drug use: No  . Sexual activity: Not on file  Lifestyle  . Physical activity:    Days per week: Not on file    Minutes per session: Not on file  . Stress: Not on file  Relationships  . Social connections:    Talks on phone: Not on file    Gets together: Not on file    Attends religious service: Not on file    Active member of club or organization: Not on file    Attends meetings of clubs or organizations: Not on file    Relationship status: Not on file  . Intimate partner violence:    Fear of  current or ex partner: Not on file    Emotionally abused: Not on file    Physically abused: Not on file    Forced sexual activity: Not on file  Other Topics Concern  . Not on file  Social History Narrative  . Not on file  ,  reports that she has never smoked. She has never used smokeless tobacco. She reports that she does not drink alcohol or use drugs.   Family history   Her family history includes Arrhythmia in her mother; Lung cancer in her mother; Melanoma in her brother. There is no history of Heart attack.   Allergies Allergies  Allergen Reactions  . Peanut-Containing Drug Products Anaphylaxis  . Reglan [Metoclopramide] Other (See Comments)    "Messed my mind up"  . Clindamycin/Lincomycin Diarrhea  . Codeine Nausea Only  . Iohexol Swelling and Other (See Comments)     Desc: pt received hypaque 60% in 1994 and had erythema, hives & lips swell.  She did ok w/o premeds in 8/05 at [facility].  Pt. takes daily prednisone for rheumatoid arthritis.  Poor venous access today (7/7/6) so we did her w/o iv contrast.   . Pineapple Swelling  . Shellfish Allergy Swelling and Other (See Comments)    Lips swell    Home meds  Prior to Admission medications   Medication Sig Start Date End Date Taking? Authorizing Provider  acebutolol (SECTRAL) 200 MG capsule Take 1 capsule (200 mg total) by mouth daily. 03/10/18   Yes Bhagat, Bhavinkumar, PA  amiodarone (PACERONE) 100 MG tablet Take 1 tablet (100 mg total) by mouth 2 (two) times daily. 06/05/18  Yes Nahser, Wonda Cheng, MD  apixaban (ELIQUIS) 5 MG TABS tablet Take 1 tablet (5 mg total) by mouth 2 (two) times daily. 07/31/17  Yes Daune Perch, NP  butalbital-acetaminophen-caffeine (FIORICET, ESGIC) 50-325-40 MG per tablet Take 1 tablet by mouth every 6 (six) hours as needed for headache or migraine.  06/23/12  Yes [provider]  Calcium Carbonate-Vitamin D (CALCIUM 500+D PO) Take 1 tablet by mouth 2 (two) times daily.    Yes [provider]  diclofenac sodium (VOLTAREN) 1 % GEL Apply 2 grams topically at bedtime to shoulders and elbows 01/29/18  Yes Meredith Staggers, MD  Docusate Calcium (STOOL SOFTENER PO) Take 1 tablet by mouth daily as needed (constipation).    Yes [provider]  escitalopram (LEXAPRO) 5 MG tablet TAKE 1 TABLET BY MOUTH IN THE MORNING Patient taking differently: Take 5 mg by mouth daily. TAKE 1 TABLET BY MOUTH IN THE MORNING 06/16/18  Yes Meredith Staggers, MD  ferrous sulfate 325 (65 FE) MG tablet Take 325 mg by mouth at bedtime.    Yes [provider]  folic acid (FOLVITE) 1 MG tablet Take 1 mg by mouth daily.  12/03/13  Yes [provider]  furosemide (LASIX) 40 MG tablet Take 1 tablet (40 mg total) by mouth daily. 05/23/18 08/21/18 Yes Nahser, Wonda Cheng, MD  lidocaine (LIDODERM) 5 % PLACE 3 PATCHES ONTO THE SKIN EVERY 12 (TWELVE) HOURS. APPLY NECK AND ELBOW Patient taking differently: Place 2 patches onto the skin daily.  11/01/17  Yes Meredith Staggers, MD  LORazepam (ATIVAN) 2 MG tablet Take 2 mg by mouth at bedtime. 02/05/15  Yes [provider]  methotrexate (RHEUMATREX) 2.5 MG tablet Take 15 mg by mouth every Saturday.  03/01/12  Yes [provider]  nitroGLYCERIN (NITROSTAT) 0.4 MG SL tablet Place 1 tablet (0.4 mg  total) under the tongue every 5 (five) minutes as needed for  chest pain. 02/21/16  Yes Turner, Eber Hong, MD  nystatin cream (MYCOSTATIN) Apply 1 application topically daily as needed for dry skin.   Yes [provider]  oxyCODONE-acetaminophen (PERCOCET) 7.5-325 MG tablet Take 1 tablet by mouth every 6 (six) hours as needed for moderate pain. 04/30/18  Yes Meredith Staggers, MD  pantoprazole (PROTONIX) 40 MG tablet Take 40 mg by mouth 2 (two) times daily.    Yes [provider]  Potassium Bicarb-Citric Acid (EFFER-K) 10 MEQ TBEF Take 1 tablet (10 mEq total) by mouth daily. 05/23/18  Yes Nahser, Wonda Cheng, MD  predniSONE (DELTASONE) 5 MG tablet Take 5 mg by mouth daily with breakfast.    Yes [provider]  PREVIDENT 0.2 % SOLN Take 1 application by mouth at bedtime.  09/05/12  Yes [provider]  propranolol (INDERAL) 10 MG tablet Take 1 tablet (10 mg total) by mouth as needed (for palpitations). 03/10/18  Yes Bhagat, Bhavinkumar, PA  rosuvastatin (CRESTOR) 10 MG tablet Take 1 tablet (10 mg total) by mouth daily. Patient taking differently: Take 10 mg by mouth at bedtime.  03/11/18 07/02/2018 Yes Bhagat, Bhavinkumar, PA  TOVIAZ 8 MG TB24 tablet Take 8 mg by mouth daily. 10/25/15  Yes [provider]  vitamin B-12 (CYANOCOBALAMIN) 1000 MCG tablet Take 1,000 mcg by mouth daily.   Yes [provider]  vitamin C (ASCORBIC ACID) 500 MG tablet Take 500 mg by mouth daily.    Yes [provider]    CCT 60 mins  Kennieth Rad, AGACNP-BC [facility] Pgr: [phone] or if no answer [phone] 07/10/2018, 10:39 PM

## 2018-07-10 NOTE — Progress Notes (Signed)
PROGRESS NOTE  Angela [name] SFK:[id number] DOB: 02-May-1936 DOA: 06/08/2018 PCP: [name], MD   Brief Summary 82 year old woman PMH severe pyloric stenosis, status post multiple dilatations, significant weight loss, presented with nausea, initially admitted for constipation, hyponatremia.  When aggressive stool regimen failed to improve her condition a CT was obtained which revealed gastric outlet obstruction secondary to severe pyloric stenosis.  She was seen by general surgery, gastroenterology and cardiology for clearance.  She underwent gastrojejunostomy with gastrostomy and feeding jejunostomy tube placement 8/30.  Hospitalization has been complicated by postoperative adynamic ileus.  PICC line placed and TPN initiated.  Assessment/Plan Postoperative adynamic ileus following open gastrojejunostomy for gastric outlet obstruction secondary to severe pyloric stenosis secondary to peptic ulcer disease not responsive to EGD and dilatation. --Hold jejunostomy feeds per GI --continue suppository, Colace, MiraLAX. --TPN per general surgery  Urinary retention --Continue Foley catheter  Paroxysmal atrial fibrillation --apixaban on hold --Continue amiodarone, propranolol  ABLA periop. Stable s/pt 2 unit PRBC  Moderate protein calorie malnutrition --TPN per pharmacy  Rheumatoid arthritis --Continue prednisone   Continue management per gastroenterology and surgery.  Multiple command.  Disease appears stable.  Hospitalization prolonged by ileus.  DVT prophylaxis: SCDs Code Status: Full Family Communication: none Disposition Plan: SNF?   Murray Hodgkins, MD  [facility] Direct contact: [phone] --Via amion app OR  --[email or web address]; password [email or web address]  7PM-7AM contact night coverage as above 07/10/2018, 3:36 PM  LOS: 10 days   Consultants:  GI  General surgery   Procedures: #1 gastrojejunostomy 2.  Placement of gastrostomy tube 3.  Placement of feeding jejunostomy  tube  Antimicrobials:    Interval history/Subjective: Not feeling better.  Objective: Vitals:  Vitals:   07/10/18 0541 07/10/18 1443  BP: (!) 110/59 98/61  Pulse: 69 74  Resp: 16 19  Temp: 98.2 F (36.8 C)   SpO2: 99% 96%    Exam: Constitutional:   . Appears calm and comfortable Respiratory:  . CTA bilaterally, no w/r/r.  . Respiratory effort normal.  Cardiovascular:  . RRR, no m/r/g . No LE extremity edema   Abdomen:  . Distended, tender Psychiatric:  . Mental status o Mood, affect appropriate  I have personally reviewed the following:   Labs:  CBG stable.  BMP, magnesium, phosphorus unremarkable.  Imaging studies:     Scheduled Meds: . sodium chloride   Intravenous Once  . acebutolol  200 mg Per J Tube Daily  . amiodarone  100 mg Per J Tube BID  . bisacodyl  10 mg Rectal BID  . calcium carbonate (dosed in mg elemental calcium)  1,250 mg Per J Tube BID  . cholecalciferol  400 Units Per Tube Daily  . diclofenac sodium  2 g Topical QID  . docusate  100 mg Per J Tube BID  . escitalopram  5 mg Per Tube Daily  . fesoterodine  8 mg Oral Daily  . insulin aspart  0-9 Units Subcutaneous Q6H  . lansoprazole  15 mg Per Tube BID  . LORazepam  2 mg Per Tube QHS  . neomycin-bacitracin-polymyxin  1 application Topical Daily  . polyethylene glycol  17 g Per J Tube Daily  . prednisoLONE  10 mg Per Tube QAC breakfast  . rosuvastatin  10 mg Per J Tube QHS  . sodium chloride flush  3 mL Intravenous Q12H  . tamsulosin  0.4 mg Oral QPC supper  . vitamin B-12  1,000 mcg Per J Tube Daily  . vitamin C  500 mg Per J  Tube Daily   Continuous Infusions: . sodium chloride Stopped (07/09/18 1242)  . 0.9 % NaCl with KCl 20 mEq / L    . dextrose 5 % and 0.9 % NaCl with KCl 20 mEq/L 75 mL/hr at 07/10/18 1211  . TPN ADULT (ION)      Principal Problem:   Gastric outlet obstruction Active Problems:   Cervical spondylosis with myelopathy   Rheumatoid arthritis (HCC)    Hyponatremia   Dyslipidemia   Essential hypertension, benign   Aortic stenosis   CAD (coronary artery disease)   Constipation   PAF (paroxysmal atrial fibrillation) (HCC)   Cardiomyopathy (HCC)   Malnutrition of moderate degree   Acute blood loss anemia   LOS: 10 days

## 2018-07-10 NOTE — Progress Notes (Signed)
Patient ID: Angela [name], female   DOB: 1936-05-23, 82 y.o.   MRN: [id number] Notified by Dr. Sarajane Jews of patient bleeding from G tube site with hypotension. On my arrival, there was some gastric contents leaking around G tube. I placed a pursestring of 3-0 prolene and that seemed to seal it. Stomach massively dilated on abd film. Place G tube to suction. Receiving PRBC and IV fluid bolus. Per RN the G tube has put out 2L today. I suspect she is hypovolemic from these losses. Will re-assess after PRBC and bolus. CBC pending. I spoke with her brother.  Georganna Skeans, MD, MPH, FACS Trauma: [phone] General Surgery: [phone]

## 2018-07-10 NOTE — Progress Notes (Signed)
Notified Dr Sarajane Jews, and [facility] Surgery at this time regarding copious bleeding around gastric tube and Pt's increased c/o pain. Dr Sarajane Jews at bedside. Surgeon to come to bedside.

## 2018-07-10 NOTE — Care Management Note (Addendum)
Case Management Note  Patient Details  Name: Angela [name] MRN: [id number] Date of Birth: [birth year]  Subjective/Objective:   Presented with constipation/ UTI, hx  severe pyloric stenosis, CAD, atrial fibrillation, hyperlipidemia, osteoporosis, peptic ulcer disease, rheumatoid arthritis. From home alone. Pt has caregiver MWF, 6P-9P, Evonne. DME: walker.        Opengastrojejunostomy, with gastrostomy andfeedingjejunostomy tubes - 06/23/2018        -  Developed post operative ileus      - initiation of TPN 9/5   Mardene Sayer (Daughter) Garfield Cornea [address] Romilda Garret [phone]          [phone]     PCP: [name]  Action/Plan: Per PT's evaluation/ recommendations: SNF. Pt agreeable to SNF placement.when medically ready CSW aware of potential SNF need and following ....NCM will continue to monitor for needs. Active with [facility].  Expected Discharge Date:                  Expected Discharge Plan:  Skilled Nursing Facility  In-House Referral:  Clinical Social Work  Discharge planning Services  CM Consult  Post Acute Care Choice:    Choice offered to:     DME Arranged:    DME Agency:     HH Arranged:    [place] Agency:     Status of Service:  In process, will continue to follow  If discussed at Long Length of Stay Meetings, dates discussed:    Additional Comments:  Sharin Mons, RN 07/10/2018, 2:31 PM

## 2018-07-10 NOTE — Progress Notes (Signed)
Pharmacy Antibiotic Note  Angela [name] is a 82 y.o. female admitted on 06/26/2018 with pneumonia.  Pharmacy has been consulted for unasyn dosing.  Plan: Unasyn 3gm IV q8 hours F/u renal function, cultures and clinical course  Height: 5\' 1"  (154.9 cm) Weight: 141 lb 15.6 oz (64.4 kg) IBW/kg (Calculated) : 47.8  Temp (24hrs), Avg:97.9 F (36.6 C), Min:97.5 F (36.4 C), Max:98.2 F (36.8 C)  Recent Labs  Lab 07/05/18 1105 07/06/18 0442 07/06/18 0843 07/07/18 0402 07/08/18 0823 07/10/18 0406 07/10/18 1827  WBC 10.7* 12.7*  --  6.2 8.2  --  3.8*  CREATININE 0.86  --  1.18* 0.71 0.54 0.46  --     Estimated Creatinine Clearance: 49 mL/min (by C-G formula based on SCr of 0.46 mg/dL).    Allergies  Allergen Reactions  . Peanut-Containing Drug Products Anaphylaxis  . Reglan [Metoclopramide] Other (See Comments)    "Messed my mind up"  . Clindamycin/Lincomycin Diarrhea  . Codeine Nausea Only  . Iohexol Swelling and Other (See Comments)     Desc: pt received hypaque 60% in 1994 and had erythema, hives & lips swell.  She did ok w/o premeds in 8/05 at [facility].  Pt. takes daily prednisone for rheumatoid arthritis.  Poor venous access today (7/7/6) so we did her w/o iv contrast.   . Pineapple Swelling  . Shellfish Allergy Swelling and Other (See Comments)    Lips swell    Thank you for allowing pharmacy to be a part of this patient's care.  [name] 07/10/2018 11:02 PM

## 2018-07-10 NOTE — Progress Notes (Signed)
Subjective: The patient was seen and examined at bedside. Reports multiple episodes of non bloody emesis yesterday and has not had a bowel movement in over 48 hours.She states that the upper abdominal pain has improved, however, reports more pain on right side of the abdomen today.  Objective: Vital signs in last 24 hours: Temp:  [98.2 F (36.8 C)] 98.2 F (36.8 C) (09/05 0541) Pulse Rate:  [69-73] 69 (09/05 0541) Resp:  [16-23] 16 (09/05 0541) BP: (99-110)/(56-74) 110/59 (09/05 0541) SpO2:  [97 %-99 %] 99 % (09/05 0541) Weight:  [62.8 kg-64.4 kg] 64.4 kg (09/05 0541) Weight change:  Last BM Date: 07/07/18  HC:WCBJSEG weaker than yesterday, frail, thinly built, elderly GENERAL:very dry oral mucosa, mild pallor, no icterus ABDOMEN:distended, tympanitic on percussion, worsening distention on the right side of the abdomen, copious amount of drainage from gastrostomy tube( 1600 mL of light brown colored fluid in the bag), infrequent high-pitched bowel sounds EXTREMITIES:no edema, no deformity  Lab Results: Results for orders placed or performed during the hospital encounter of 07/01/2018 (from the past 48 hour(s))  Glucose, capillary     Status: Abnormal   Collection Time: 07/08/18 12:26 PM  Result Value Ref Range   Glucose-Capillary 139 (H) 70 - 99 mg/dL  Glucose, capillary     Status: Abnormal   Collection Time: 07/08/18  4:05 PM  Result Value Ref Range   Glucose-Capillary 124 (H) 70 - 99 mg/dL  Glucose, capillary     Status: Abnormal   Collection Time: 07/08/18  8:08 PM  Result Value Ref Range   Glucose-Capillary 108 (H) 70 - 99 mg/dL  Glucose, capillary     Status: Abnormal   Collection Time: 07/08/18  8:20 PM  Result Value Ref Range   Glucose-Capillary 112 (H) 70 - 99 mg/dL  Glucose, capillary     Status: None   Collection Time: 07/09/18  8:14 AM  Result Value Ref Range   Glucose-Capillary 85 70 - 99 mg/dL  Basic metabolic panel     Status: Abnormal   Collection Time:  07/10/18  4:06 AM  Result Value Ref Range   Sodium 132 (L) 135 - 145 mmol/L   Potassium 4.4 3.5 - 5.1 mmol/L   Chloride 105 98 - 111 mmol/L   CO2 19 (L) 22 - 32 mmol/L   Glucose, Bld 122 (H) 70 - 99 mg/dL   BUN 14 8 - 23 mg/dL   Creatinine, Ser 0.46 0.44 - 1.00 mg/dL   Calcium 7.9 (L) 8.9 - 10.3 mg/dL   GFR calc non Af Amer >60 >60 mL/min   GFR calc Af Amer >60 >60 mL/min    Comment: (NOTE) The eGFR has been calculated using the CKD EPI equation. This calculation has not been validated in all clinical situations. eGFR's persistently <60 mL/min signify possible Chronic Kidney Disease.    Anion gap 8 5 - 15    Comment: Performed at [facility] [address], [place], [place] [address]  Magnesium     Status: None   Collection Time: 07/10/18  4:06 AM  Result Value Ref Range   Magnesium 1.8 1.7 - 2.4 mg/dL    Comment: Performed at [facility] [address], [place], [place] [address]  Phosphorus     Status: None   Collection Time: 07/10/18  4:06 AM  Result Value Ref Range   Phosphorus 3.3 2.5 - 4.6 mg/dL    Comment: Performed at [facility] [address]  [place], [place] [address]    Studies/Results: Dg Abd 1 View  Result Date: 07/10/2018 CLINICAL DATA:  82 year old female with chronic gastric outlet obstruction due to peptic ulcer disease related severe pyloric stenosis. Postoperative day 6 from placement of gastrostomy and jejunostomy tubes. EXAM: ABDOMEN - 1 VIEW COMPARISON:  07/09/2018 and earlier, including the preoperative CT 07/01/2018. FINDINGS: Continued contrast mixed with food debris in what appears to be a massively dilated stomach. A left upper quadrant gastrostomy tube is visible. A jejunostomy tube projects over the lower portion of the distal stomach. There is some retained oral contrast in nondilated transverse colon and hepatic flexure. Gas-filled nondilated bowel loops elsewhere. No pneumoperitoneum is evident. Stable cholecystectomy clips.  Stable visible lung bases. Osteopenia. No acute osseous abnormality identified. IMPRESSION: 1. Continued massively dilated stomach with gastric contrast mixed with food debris. Dilated gastric antrum simulates the appearance of dilated right colon. See CT Abdomen and Pelvis 07/01/2018. 2. Nondilated hepatic flexure and transverse colon containing oral contrast. Gas-filled nondilated bowel loops elsewhere may reflect ileus. Electronically Signed   By: Genevie Ann M.D.   On: 07/10/2018 08:59   Dg Abd 1 View  Result Date: 07/09/2018 CLINICAL DATA:  R14.0 (ICD-10-CM) - Abdominal distension K91.89,K56.7 (ICD-10-CM) - Ileus following gastrointestinal surgery EXAM: ABDOMEN - 1 VIEW COMPARISON:  07/08/2018 and older exams. FINDINGS: There is right colon dilation, which measures 12 cm on this magnified AP view, without significant change from previous day's study. Residual contrast mixed with stool is noted throughout the colon. Stomach is also distended similar to the previous day's exam. There are loops of mildly dilated small bowel. Post surgical drainage tubes are stable. IMPRESSION: 1. Stable appearance from the previous day's exam. Distended stomach and bowel, most prominently the right colon, is most consistent with a postoperative adynamic ileus. Electronically Signed   By: Lajean Manes M.D.   On: 07/09/2018 11:12   Dg Abd 1 View  Result Date: 07/08/2018 CLINICAL DATA:  Abdominal pain. EXAM: ABDOMEN - 1 VIEW COMPARISON:  07/07/2018. FINDINGS: Surgical staples are noted over the abdomen. Distended loops of small and large bowel are again noted consistent adynamic ileus. No free air. Oral contrast again noted in the colon. IMPRESSION: Findings again noted consistent with adynamic ileus. No interim change from prior exam. Electronically Signed   By: Marcello Moores  Register   On: 07/08/2018 10:01   [place] Ekg Site Rite  Result Date: 07/10/2018 If Site Rite image not attached, placement could not be confirmed due to current  cardiac rhythm.   Medications: I have reviewed the patient's current medications.  Assessment: 1.Persistent ileus noted on abdominal x-ray from today. The bypassed segment of gastric antrum appears to be massively distended with residual food debris.  2.Postop day 6 of  of open gastrojejunostomy with feeding jejunostomy and venting gastrostomy tube placement    Plan: 1.Small bowel protocol x-ray from J-tube ordered by surgical team to better visualize small bowel anatomy. 2.Keep patient nothing by mouth except ice chips,while continuing patient on D5 normal saline at 75 mL an hour. 3.Encouragd early mobilization and frequent out of bed to chair and minimize use of narcotics 4.Continue giving MiraLAX via J-tube along with Colace and Dulcolax suppositories 5.Keep potassium above4.5 and magnesium around 2, please supplement if needed   [name] 07/10/2018, 9:19 AM   Pager [phone] If no answer or after 5 PM call [phone]

## 2018-07-10 NOTE — Progress Notes (Addendum)
[facility] Progress Note  6 Days Post-Op  Subjective: CC:  Reports upper abdominal pain slightly improved compared to yesterday. Pt still has significant abdominal distention and reports multiple episodes of small volume, non-bloody emesis yesterday. +flatus. Also reports belching. Denies BM. Reports getting 3 suppositories yesterday.   Objective: Vital signs in last 24 hours: Temp:  [98.2 F (36.8 C)] 98.2 F (36.8 C) (09/05 0541) Pulse Rate:  [69-73] 69 (09/05 0541) Resp:  [16-23] 16 (09/05 0541) BP: (99-110)/(56-74) 110/59 (09/05 0541) SpO2:  [97 %-99 %] 99 % (09/05 0541) Weight:  [62.8 kg-64.4 kg] 64.4 kg (09/05 0541) Last BM Date: 07/07/18  Intake/Output from previous day: 09/04 0701 - 09/05 0700 In: 2107.4 [P.O.:120; I.V.:1807.4] Out: 887.5 [Urine:480; Drains:407.5] Intake/Output this shift: No intake/output data recorded.  PE: Gen:  Alert, NAD, cooperative, chronically ill appearing  Card:  Regular rate Pulm:  Normal effort Abd: moderate to severe distention with visible and palpable loop of small bowel in the right mid to lower abdominal wall, mild TTP without peritonitis, incision clean and dry, G-tube to gravity, J-tube clamped. Skin: warm and dry, no rashes  Psych: A&Ox3   Lab Results:  Recent Labs    07/08/18 0823  WBC 8.2  HGB 10.1*  HCT 30.5*  PLT 197   BMET Recent Labs    07/08/18 0823 07/10/18 0406  NA 134* 132*  K 4.3 4.4  CL 106 105  CO2 23 19*  GLUCOSE 110* 122*  BUN 13 14  CREATININE 0.54 0.46  CALCIUM 7.8* 7.9*   PT/INR No results for input(s): LABPROT, INR in the last 72 hours. CMP     Component Value Date/Time   NA 132 (L) 07/10/2018 0406   NA 135 06/09/2018 1324   K 4.4 07/10/2018 0406   CL 105 07/10/2018 0406   CO2 19 (L) 07/10/2018 0406   GLUCOSE 122 (H) 07/10/2018 0406   BUN 14 07/10/2018 0406   BUN 13 06/09/2018 1324   CREATININE 0.46 07/10/2018 0406   CREATININE 0.60 05/25/2016 1252   CALCIUM 7.9 (L)  07/10/2018 0406   PROT 4.9 (L) 06/09/2018 1148   ALBUMIN 3.4 (L) 06/22/2018 1148   AST 65 (H) 07/03/2018 1148   ALT 32 06/10/2018 1148   ALKPHOS 28 (L) 06/24/2018 1148   BILITOT 2.2 (H) 06/15/2018 1148   GFRNONAA >60 07/10/2018 0406   GFRAA >60 07/10/2018 0406   Lipase     Component Value Date/Time   LIPASE 34 06/28/2018 1148       Studies/Results: Dg Abd 1 View  Result Date: 07/09/2018 CLINICAL DATA:  R14.0 (ICD-10-CM) - Abdominal distension K91.89,K56.7 (ICD-10-CM) - Ileus following gastrointestinal surgery EXAM: ABDOMEN - 1 VIEW COMPARISON:  07/08/2018 and older exams. FINDINGS: There is right colon dilation, which measures 12 cm on this magnified AP view, without significant change from previous day's study. Residual contrast mixed with stool is noted throughout the colon. Stomach is also distended similar to the previous day's exam. There are loops of mildly dilated small bowel. Post surgical drainage tubes are stable. IMPRESSION: 1. Stable appearance from the previous day's exam. Distended stomach and bowel, most prominently the right colon, is most consistent with a postoperative adynamic ileus. Electronically Signed   By: Lajean Manes M.D.   On: 07/09/2018 11:12   Dg Abd 1 View  Result Date: 07/08/2018 CLINICAL DATA:  Abdominal pain. EXAM: ABDOMEN - 1 VIEW COMPARISON:  07/07/2018. FINDINGS: Surgical staples are noted over the abdomen. Distended loops of small and  large bowel are again noted consistent adynamic ileus. No free air. Oral contrast again noted in the colon. IMPRESSION: Findings again noted consistent with adynamic ileus. No interim change from prior exam. Electronically Signed   By: [place]   On: 07/08/2018 10:01    Anti-infectives: Anti-infectives (From admission, onward)   Start     Dose/Rate Route Frequency Ordered Stop   06/20/2018 0915  cefoTEtan (CEFOTAN) 1 g in sodium chloride 0.9 % 100 mL IVPB     1 g 200 mL/hr over 30 Minutes Intravenous To Surgery  06/06/2018 0830 06/16/2018 1132   06/30/18 0645  cefTRIAXone (ROCEPHIN) 1 g in sodium chloride 0.9 % 100 mL IVPB  Status:  Discontinued     1 g 200 mL/hr over 30 Minutes Intravenous Every 24 hours 06/30/18 0643 07/02/18 1655   06/12/2018 1500  cefTRIAXone (ROCEPHIN) 1 g in sodium chloride 0.9 % 100 mL IVPB  Status:  Discontinued     1 g 200 mL/hr over 30 Minutes Intravenous Every 24 hours 06/06/2018 1451 06/05/2018 1528       Assessment/Plan CAD s/p stent in 2008, not on ASA due to frequent ulcers SVT HTN Atrial fibrillation PUD  Gastroparesis and GOO Rheumatoid arthritis/Spondylolisthesis/Chronic pain syndrome Osteoporosis UTI - on IV rocephin  Constipation   Gastric outlet obstruction secondary to severe pyloric stenosis - EGD with bx 8/13 did not show malignancy, pyloric stenosis most likely due to PUD - past balloon dilations 01/10/18, 01/24/18, 04/15/18, 06/17/18 - all by Dr. Therisa Doyne  POD#6 S/POpengastrojejunostomy, with gastrostomy andfeedingjejunostomy tubes - 06/29/2018 - Dr. Georgette Dover - Post-operative ileus, having flatus - AXR with significant amount of enteric contents, likely tube feeds, in what appears to be the patients jejunum. Small bowel protocol per J tube today to better visualize small bowel anatomy and rule out obstruction, contrast may also be therapeutic.  -Continuegastrostomy to straight drain, patient may have limited ice -continue to hold J-tube feeds and continue dulcolax per tube, transition miralax from PO to per tube;flush every 4 hours and before/after medication use. Liquid meds per tube preferred, meds can be finely crushed and administered if necessary.  FEN: IVF, ice chips, Hold TF, start TNA  VTE: SCD's, heparin okay from a surgical standpoint ID: pre-op only Foley: in place for urinary retention, per medicine Follow up: Dr. Georgette Dover   PLAN: SB protocol per J tube. continue to hold tube feeds and start TNA, OOB    LOS: 10 days    Obie Dredge,  [facility] Pager: [phone]

## 2018-07-10 NOTE — Progress Notes (Addendum)
At 1700 pt was able to ambulate to chair with mod assistance.  Small amount of blood seeped from around g-tube at this time. 10 minutes later site was assessed and no more bloody drainage was noted.  At approx 1800 pt complained she was in pain. I went to assist her out of the chair and noticed that she had been bleeding from around the g-tube.  Blood pressure was checked 123/70ish.  Pt was assisted from chair to bed.  Upon standing serosanguinous fluid came more profusely from g tube.  Bleeding slowed to an ooze when she layed in bed with head elevated.  Pt requested we call her brother, which we did around 1810.  He came to the room shortly afterward.  1815, vitals were checked and rapid response was called.  tpn was stopped for iv fluid bolus.  Surgeon GI and Dr. Sarajane Jews were notified of patient condition.  Sarajane Jews was at bedside at 18:15.   [place] surgeon irrigated G tube and output was recorded. Vitals were charted.    Report was given by phone to [facility] in ICU.  Pt was transported by [place] and [place] RN's.  Through out event oxygen level kept dropping, oxygen was increased from [facility] to [facility] to [facility].  Pt had increased work of breathing, which was slightly eased after Dr. Grandville Silos putted off gastric fluids.

## 2018-07-10 NOTE — Significant Event (Signed)
Rapid Response Event Note  Overview:  Called to assist with patient with hypotension and bleeding from Gtube site Time Called: 1810 Arrival Time: 1815 Event Type: Hypotension, Other (Comment)  Initial Focused Assessment:  Patient supine in bed - pale - cool - moist skin.  She is alert - oriented - c/o severe pain in abdomen left side.  Abdominal dressing is saturated with BRB - RN Lauren at bedside holding pressure at G-tube site.  Abd distended and firm.  NS bolus infusing via PICC line.  Dr. Sarajane Jews at bedside.  BP fluctuating from 70's to 90's.   Patient states this pain was sudden when she got to the chair and is different than her previous pain.  Resps rapid - staff placed on oxygen 4 liters.     Interventions:  Stat abd film per orders.  Dressing removed - abd incision intact - jejunostomy tube intact - no drainage - G-tube intact - very little blood present around the tube - golden bile from the tube.  RN reports she has put out 2 liters bile today from the G-tube.  Now with onset of bile oozing around the tube - continues to cry out in pain.  Oxygen placed by staff.  Bil BS clear.  Increased RR with increased pain.  Emergency release blood started per order Dr. Sarajane Jews.  Labs sent.  Dr. Grandville Silos to bedside - stitch to G-tube and connected to low continuos drainage - golden/green bile return.  BP continues to fluctuate.  Orders for transfer to ICU - handoff to [place] RN night RRT coverage.  See flowsheet for vital signs.    Plan of Care (if not transferred):  Event Summary: Name of Physician Notified: Dr. Sarajane Jews  at (at bedside on my arrival)  Name of Consulting Physician Notified: Dr. Georganna Skeans at (per Dr. Sarajane Jews)  Outcome: Transferred (Comment)  Event End Time: 1930(handoff to nighttime RRT RN)  Quin Hoop

## 2018-07-10 NOTE — Evaluation (Signed)
Occupational Therapy Evaluation Patient Details Name: Angela [name] MRN: [id number] DOB: [birth year] Today's Date: 07/10/2018    History of Present Illness 82 year old with past medical history of severe pyloric stenosis, CAD, atrial fibrillation, hyperlipidemia, osteoporosis, peptic ulcer disease, rheumatoid arthritis admitted with abdominal pain.  CT of the abdomen pelvis showed severe gastric distention due to severe pyloric obstruction, s/p Open gastrojejunostomy, with gastrostomy and feeding jejunostomy tubes placement on 07/05/2018   Clinical Impression   Patient presenting with decreased I in self care, balance, functional transfer/mobility, strength, endurance, safety awareness, and pain management.  Patient reports living at home with use of RW and assist with IADL and ADL task from personal care assistance PTA. Patient currently functioning mod A- +2 assistance for safety with mobility. Patient will benefit from acute OT to increase overall independence in the areas of ADLs, functional mobility, and safety awareness in order to safely discharge to next venue of care.   Follow Up Recommendations  SNF;Supervision/Assistance - 24 hour    Equipment Recommendations  Other (comment)(defer to next venue of care)    Recommendations for Other Services Other (comment)(none known at this time)     Precautions / Restrictions Precautions Precautions: Fall Required Braces or Orthoses: Other Brace/Splint Other Brace/Splint: abdominal binder Restrictions Weight Bearing Restrictions: No      Mobility Bed Mobility Overal bed mobility: Needs Assistance Bed Mobility: Rolling;Sidelying to Sit Rolling: Mod assist Sidelying to sit: Max assist;HOB elevated       General bed mobility comments: binder donned. Assistance to removed legs from bed and trunk upright  Transfers    General transfer comment: deferred this session secondary to increased pain    Balance Overall balance assessment:  Needs assistance Sitting-balance support: No upper extremity supported;Feet supported Sitting balance-Leahy Scale: Fair Sitting balance - Comments: Supervision overall       ADL either performed or assessed with clinical judgement   ADL Overall ADL's : Needs assistance/impaired     Grooming: Minimal assistance;Sitting   Upper Body Bathing: Moderate assistance;Sitting   Lower Body Bathing: Total assistance;Bed level   Upper Body Dressing : Moderate assistance;Sitting   Lower Body Dressing: Total assistance;Bed level          Vision Baseline Vision/History: Wears glasses Wears Glasses: Reading only Patient Visual Report: No change from baseline Vision Assessment?: No apparent visual deficits            Pertinent Vitals/Pain Pain Assessment: 0-10 Pain Score: 7  Pain Location: across abdomen  Pain Descriptors / Indicators: Operative site guarding;Grimacing;Guarding Pain Intervention(s): Repositioned;Monitored during session     Hand Dominance Right   Extremity/Trunk Assessment Upper Extremity Assessment Upper Extremity Assessment: RUE deficits/detail RUE Deficits / Details: AAROM limited elbow extension reports old fracture poorly healed; strength elbow flexion 3+/5, extension 3/5, grip weak LUE Deficits / Details: AAROM shoulder flexion 95-100, strength elbow flexion 4-/5, extension 3/5, shoulder flexion 3-/5, grip weak   Lower Extremity Assessment Lower Extremity Assessment: Defer to PT evaluation   Cervical / Trunk Assessment Cervical / Trunk Assessment: Kyphotic   Communication Communication Communication: No difficulties   Cognition Arousal/Alertness: Awake/alert Behavior During Therapy: WFL for tasks assessed/performed Overall Cognitive Status: Within Functional Limits for tasks assessed                  Home Living Family/patient expects to be discharged to:: Skilled nursing facility Living Arrangements: Alone Available Help at Discharge:  Available PRN/intermittently;Personal care attendant Type of Home: House Home Access: Elevator  Home Layout: One level     Bathroom Shower/Tub: Walk-in shower         Home Equipment: Environmental consultant - 2 wheels;Shower seat;Bedside commode          Prior Functioning/Environment Level of Independence: Needs assistance  Gait / Transfers Assistance Needed: walked with RW on her own, 3 falls in 6 months ADL's / Homemaking Assistance Needed: can do light meal prep (barely) caregiver 3 hours a day 4 days a week            OT Problem List: Decreased strength;Impaired balance (sitting and/or standing);Pain;Decreased range of motion;Decreased safety awareness;Increased edema;Decreased activity tolerance;Decreased coordination;Decreased knowledge of use of DME or AE;Impaired UE functional use      OT Treatment/Interventions: Self-care/ADL training;Visual/perceptual remediation/compensation;Therapeutic exercise;Modalities;Patient/family education;Balance training;Energy conservation;Therapeutic activities;DME and/or AE instruction    OT Goals(Current goals can be found in the care plan section) Acute Rehab OT Goals Patient Stated Goal: agreeable to rehab at [facility] OT Goal Formulation: With patient Time For Goal Achievement: 07/24/18 Potential to Achieve Goals: Good ADL Goals Pt Will Perform Grooming: with set-up;sitting Pt Will Perform Upper Body Bathing: with supervision;sitting;with set-up Pt Will Perform Lower Body Bathing: with mod assist Pt Will Perform Upper Body Dressing: with min assist Pt Will Perform Lower Body Dressing: with mod assist  OT Frequency: Min 2X/week   Barriers to D/C: Other (comment)  none known at this time          AM-PAC PT "6 Clicks" Daily Activity     Outcome Measure Help from another person eating meals?: A Little Help from another person taking care of personal grooming?: A Little Help from another person toileting, which includes using toliet,  bedpan, or urinal?: Total Help from another person bathing (including washing, rinsing, drying)?: A Lot Help from another person to put on and taking off regular upper body clothing?: A Lot Help from another person to put on and taking off regular lower body clothing?: Total 6 Click Score: 12   End of Session    Activity Tolerance: Patient limited by pain Patient left: in bed;with call bell/phone within reach;with bed alarm set;with family/visitor present  OT Visit Diagnosis: Muscle weakness (generalized) (M62.81);Repeated falls (R29.6);Unsteadiness on feet (R26.81);History of falling (Z91.81)                Time: 3888-2800 OT Time Calculation (min): 19 min Charges:  OT General Charges $OT Visit: 1 Visit  Gypsy Decant, MS, OTR/L 07/10/2018, 3:22 PM

## 2018-07-10 NOTE — Progress Notes (Signed)
Per Stanton Kidney RN, patient will need PICC line and will discontinue order for PIV start.

## 2018-07-10 NOTE — Progress Notes (Signed)
Peripherally Inserted Central Catheter/Midline Placement  The IV Nurse has discussed with the patient and/or persons authorized to consent for the patient, the purpose of this procedure and the potential benefits and risks involved with this procedure.  The benefits include less needle sticks, lab draws from the catheter, and the patient may be discharged home with the catheter. Risks include, but not limited to, infection, bleeding, blood clot (thrombus formation), and puncture of an artery; nerve damage and irregular heartbeat and possibility to perform a PICC exchange if needed/ordered by physician.  Alternatives to this procedure were also discussed.  Bard Power PICC patient education guide, fact sheet on infection prevention and patient information card has been provided to patient /or left at bedside.  Consent signed by brother at patient request.  PICC/Midline Placement Documentation  PICC Double Lumen 07/10/18 PICC Left Brachial 42 cm 0 cm (Active)  Indication for Insertion or Continuance of Line Administration of hyperosmolar/irritating solutions (i.e. TPN, Vancomycin, etc.) 07/10/2018 11:47 AM  Exposed Catheter (cm) 0 cm 07/10/2018 11:47 AM  Site Assessment Clean;Dry;Intact 07/10/2018 11:47 AM  Lumen #1 Status Flushed;Saline locked;Blood return noted 07/10/2018 11:47 AM  Lumen #2 Status Flushed;Saline locked;Blood return noted 07/10/2018 11:47 AM  Dressing Type Transparent 07/10/2018 11:47 AM  Dressing Status Clean;Dry;Intact;Antimicrobial disc in place 07/10/2018 11:47 AM  Dressing Intervention New dressing 07/10/2018 11:47 AM  Dressing Change Due 07/17/18 07/10/2018 11:47 AM       Joeanna Howdyshell, Nicolette Bang 07/10/2018, 11:48 AM

## 2018-07-10 NOTE — Progress Notes (Addendum)
PHARMACY - ADULT TOTAL PARENTERAL NUTRITION CONSULT NOTE   Pharmacy Consult:  TPN Indication:  Prolonged ileus  Patient Measurements: Height: 5\' 1"  (154.9 cm) Weight: 141 lb 15.6 oz (64.4 kg) IBW/kg (Calculated) : 47.8 TPN AdjBW (KG): 48.5 Body mass index is 26.83 kg/m. Usual Weight: 64 kg  Assessment:  [name] presented on 06/25/2018 with nausea and right arm tingling and numbness.  Patient has a history of severe pyloric stenosis and channel difficulties resulting in retained food.  She underwent gastrojejunostomy with insertion of gastrostomy (for draining) and jejunostomy (for feeding) tubes on 06/22/2018.  TF started on 07/05/18 and patient tolerated goal rate.  On 07/07/18, imaging concerning for ileus.  TF held on 07/08/18 and repeat imaging revealed significant amount of enteric content, likely tube feeds.  She had multiple episodes of emesis on 07/09/18 and Pharmacy consulted to initiate TPN while work-up is in progress to rule out obstruction.  Per documentation, patient has moderate malnutrition.  She has poor PO intake for 2 months PTA and was at risk for refeeding when TF started.  GI: hx GOO/GERD/PUD.  Gtube O/P 427mL, LBM 9/2.  Bisacodyl PR, docusate, Prevacid, Miralax, PRN Zofran Endo: no hx DM - CBGs controlled (on prednisone for RA) Insulin requirements in the past 24 hours: N/A Lytes: low Na, CO2, K and Mag (9/5 GI progress note stated to keep K > 4.5 and Mag > 2) Renal: SCr 0.46 stable, BUN WNL - UOP 0.3 ml/kg/hr.  Roney Jaffe, D51/2NS 20K at 75/hr Pulm: stable on RA Cards: CAD/HTN/HLD - VSS - acebutolol, amiodarone, Crestor AC: Eliquis PTA for AFib >> Heparin, now on hold - mild anemia, plts WNL Hepatobil: LFTs WNL except AST and tbili at 2.2 on admit, lipase WNL Neuro: chronic pain - Voltaren gel, Lexapro, folate, Ativan, PRN APAP/morphine/Percocet ID: afebrile, WBC WNL - not on abx TPN Access: PICC to be placed today TPN start date: 07/10/18  Nutritional Goals (per RD rec  on 8/31): 1400-1600 kCal, 65-75gm protein and >/= 1.5L fluid per day  Current Nutrition:  NPO   Plan:  Initiate TPN at 30 ml/hr (goal rate ~60 ml/hr) TPN will provide 35g AA, 130g CHO and 19g ILE for a total of 641 kCal, meeting ~50% of patient's needs Electrolytes in TPN: increase K/Mag/Ca/Phos with initial TPN rate, Cl:Ac 1:2 Daily multivitamin and trace elements in TPN D/C folate and add 1mg  to daily TPN Change IVF to D5NS 20K, continue at 75 ml/hr.  At 1800 when TPN starts, change IVF to NS 20K and reduce rate to 45 ml/hr. Start sensitive SSI Q6H.  D/C if CBGs remain controlled at goal TPN rate. Mag sulfate 2gm IV x 1 Standard TPN labs and nursing care orders   Collin Rengel D. Mina Marble, PharmD, BCPS, [place] 07/10/2018, 9:39 AM

## 2018-07-10 NOTE — Progress Notes (Signed)
Nutrition Follow-up / Consult  DOCUMENTATION CODES:   Non-severe (moderate) malnutrition in context of chronic illness  INTERVENTION:    TPN dosing per Pharmacy.   Monitor magnesium, potassium, and phosphorus and replete as needed, as pt is at risk for refeeding syndrome.  NUTRITION DIAGNOSIS:   Moderate Malnutrition related to altered GI function, chronic illness(severe pyloric stenosis) as evidenced by moderate fat depletion, moderate muscle depletion, energy intake < 75% for > or equal to 1 month.  Ongoing  GOAL:   Patient will meet greater than or equal to 90% of their needs  Unmet  MONITOR:   TF tolerance, Labs, Skin, I & O's, Weight trends  REASON FOR ASSESSMENT:   Consult New TPN/TNA  ASSESSMENT:   82 year old with past medical history of severe pyloric stenosis, CAD, atrial fibrillation, hyperlipidemia, osteoporosis, peptic ulcer disease, rheumatoid arthritis came to the hospital with concerns of feeling very nauseous and abdominal discomfort.  In the ER abdominal x-ray showed large stool burden without any other acute pathology.  CT of the abdomen pelvis showed severe gastric distention due to severe pyloric obstruction, s/p gastrojejunostomy, with gastrostomy and feeding jejunostomy tubes placement on 06/12/2018  TF has been on a off since 8/31 due to signs of intolerance (nausea & vomiting). S/P abdominal x-ray this morning--showed dilated stomach, non-dilated bowel loops, likely reflective of ileus.  PICC was placed today. TPN being initiated today for prolonged ileus.  TF was discontinued on 9/4. Labs reviewed. Sodium 132 (L) Medications reviewed and include calcium carbonate, vitamin D, Colace, Novolog, Flomax, Vitamin B-12, Vitamin C.   Diet Order:   Diet Order            Diet NPO time specified Except for: Ice Chips, Sips with Meds, Other (See Comments)  Diet effective now              EDUCATION NEEDS:   Not appropriate for education at this  time  Skin:  Skin Assessment: Skin Integrity Issues: Skin Integrity Issues:: Incisions Incisions: abdomen  Last BM:  9/2  Height:   Ht Readings from Last 1 Encounters:  07/02/2018 5\' 1"  (1.549 m)    Weight:   Wt Readings from Last 1 Encounters:  07/10/18 64.4 kg    Ideal Body Weight:  48 kg  BMI:  Body mass index is 26.83 kg/m.  Estimated Nutritional Needs:   Kcal:  1600-1800  Protein:  80-90 gm  Fluid:  >/= 1.6 L    Molli Barrows, RD, LDN, [place] Pager [phone] After Hours Pager [phone]

## 2018-07-10 NOTE — Progress Notes (Signed)
Patient ID: Angela [name], female   DOB: 08-24-1936, 82 y.o.   MRN: [id number] Using a Toomey syringe, I was able to withdraw about 1200cc of gastric contents from the G tube. It frequently gets clogged with old food debris but flushed well. TF to ICU. I again D/W Dr. Sarajane Jews at the bedside. Patient examined and I agree with the assessment and plan  Georganna Skeans, MD, MPH, FACS Trauma: [phone] General Surgery: [phone]  07/10/2018 7:45 PM

## 2018-07-10 NOTE — Progress Notes (Addendum)
[facility] Progress Note Patient Name: CALEB PRIGMORE DOB: [birth year] MRN: [id number]   Date of Service  07/10/2018  HPI/Events of Note  Sub-optimal analgesia with Tylenol via feeding tube.  eICU Interventions  Add oxycodone 5 mg tabs  Q 6 hrs via feeding tube prn pain not controlled by Tylenol. Hold Oxycodone dose for excessive sedation.        Kerry Kass Tanairy Payeur 07/10/2018, 10:10 PM

## 2018-07-10 NOTE — Evaluation (Addendum)
Notified by RN for sudden bleeding and hypotension.  Per RN the patient was moved from bed to chair, developed sudden abdominal pain and began bleeding around the PEG tube site briskly.  She was assisted back to bed reported to be hypotensive and pressure was placed on the PEG tube site.  On my arrival the patient was hypotensive with SBP in 80s-90s.  She was awake, alert and following simple commands.  Lungs were clear.  Heart was regular.  Abdomen, distended earlier today, now appears firm, and is firm to palpation.  She complains of pain with touch.  My first concern was for internal hemorrhage.  Given her hypotension and worsening hypoxia and appearance of shock, emergency release blood 2 units was ordered.  Stat abdominal x-ray was unrevealing.  Stat CBC, type and cross pending.  Discussed case with Dr. Grandville Silos came immediately to evaluate patient.  The patient remains hypotensive, is currently receiving first of 2 units of emergency release blood.  She is being typed and crossed for 2 additional units.  She is also receiving fluid bolus is currently on nonrebreather.  I suspect hemorrhagic shock.  She will need to be upgraded to an ICU level of care.  I have discussed the urgent nature of the case with PCCM and they will come to evaluate the patient. In my opinion, if she does not need immediate operative intervention she will need ICU care.  Discussed care with night coverage Ms. Schorr who will f/u on patient and with PCCM.  Murray Hodgkins, MD [facility] [phone]

## 2018-07-11 ENCOUNTER — Other Ambulatory Visit: Payer: Self-pay | Admitting: *Deleted

## 2018-07-11 ENCOUNTER — Other Ambulatory Visit: Payer: Self-pay | Admitting: Pharmacist

## 2018-07-11 DIAGNOSIS — K9189 Other postprocedural complications and disorders of digestive system: Secondary | ICD-10-CM

## 2018-07-11 DIAGNOSIS — J690 Pneumonitis due to inhalation of food and vomit: Secondary | ICD-10-CM

## 2018-07-11 DIAGNOSIS — K567 Ileus, unspecified: Secondary | ICD-10-CM

## 2018-07-11 DIAGNOSIS — K311 Adult hypertrophic pyloric stenosis: Secondary | ICD-10-CM

## 2018-07-11 DIAGNOSIS — Z7189 Other specified counseling: Secondary | ICD-10-CM

## 2018-07-11 DIAGNOSIS — A419 Sepsis, unspecified organism: Principal | ICD-10-CM

## 2018-07-11 DIAGNOSIS — E860 Dehydration: Secondary | ICD-10-CM

## 2018-07-11 DIAGNOSIS — J9601 Acute respiratory failure with hypoxia: Secondary | ICD-10-CM

## 2018-07-11 DIAGNOSIS — R6521 Severe sepsis with septic shock: Secondary | ICD-10-CM

## 2018-07-11 LAB — CBC
HEMATOCRIT: 46.9 % — AB (ref 36.0–46.0)
HEMATOCRIT: 40.3 % (ref 36.0–46.0)
HEMOGLOBIN: 15.7 g/dL — AB (ref 12.0–15.0)
HEMOGLOBIN: 13.9 g/dL (ref 12.0–15.0)
MCH: 32.4 pg (ref 26.0–34.0)
MCH: 32.7 pg (ref 26.0–34.0)
MCHC: 33.5 g/dL (ref 30.0–36.0)
MCHC: 34.5 g/dL (ref 30.0–36.0)
MCV: 96.7 fL (ref 78.0–100.0)
MCV: 94.8 fL (ref 78.0–100.0)
Platelets: 155 10*3/uL (ref 150–400)
Platelets: 148 10*3/uL — ABNORMAL LOW (ref 150–400)
RBC: 4.85 MIL/uL (ref 3.87–5.11)
RBC: 4.25 MIL/uL (ref 3.87–5.11)
RDW: 19.9 % — ABNORMAL HIGH (ref 11.5–15.5)
RDW: 19.7 % — ABNORMAL HIGH (ref 11.5–15.5)
WBC: 3.5 10*3/uL — ABNORMAL LOW (ref 4.0–10.5)
WBC: 7.7 10*3/uL (ref 4.0–10.5)

## 2018-07-11 LAB — LACTIC ACID, PLASMA
LACTIC ACID, VENOUS: 3.1 mmol/L — AB (ref 0.5–1.9)
Lactic Acid, Venous: 3.3 mmol/L (ref 0.5–1.9)

## 2018-07-11 LAB — COMPREHENSIVE METABOLIC PANEL
ALBUMIN: 2.2 g/dL — AB (ref 3.5–5.0)
ALT: 15 U/L (ref 0–44)
AST: 21 U/L (ref 15–41)
Alkaline Phosphatase: 20 U/L — ABNORMAL LOW (ref 38–126)
Anion gap: 11 (ref 5–15)
BUN: 25 mg/dL — AB (ref 8–23)
CHLORIDE: 107 mmol/L (ref 98–111)
CO2: 19 mmol/L — ABNORMAL LOW (ref 22–32)
CREATININE: 1.16 mg/dL — AB (ref 0.44–1.00)
Calcium: 7.5 mg/dL — ABNORMAL LOW (ref 8.9–10.3)
GFR calc Af Amer: 51 mL/min — ABNORMAL LOW (ref >60)
GFR, EST NON AFRICAN AMERICAN: 44 mL/min — AB (ref >60)
GLUCOSE: 297 mg/dL — AB (ref 70–99)
POTASSIUM: 5.1 mmol/L (ref 3.5–5.1)
Sodium: 137 mmol/L (ref 135–145)
Total Bilirubin: 2.5 mg/dL — ABNORMAL HIGH (ref 0.3–1.2)
Total Protein: 3.6 g/dL — ABNORMAL LOW (ref 6.5–8.1)

## 2018-07-11 LAB — DIFFERENTIAL
Basophils Absolute: 0.1 10*3/uL (ref 0.0–0.1)
Basophils Relative: 1 %
EOS PCT: 0 %
Eosinophils Absolute: 0 10*3/uL (ref 0.0–0.7)
LYMPHS PCT: 4 %
Lymphs Abs: 0.3 10*3/uL — ABNORMAL LOW (ref 0.7–4.0)
MONO ABS: 0.5 10*3/uL (ref 0.1–1.0)
MONOS PCT: 7 %
Neutro Abs: 6.8 10*3/uL (ref 1.7–7.7)
Neutrophils Relative %: 88 %
WBC MORPHOLOGY: INCREASED

## 2018-07-11 LAB — URINALYSIS, ROUTINE W REFLEX MICROSCOPIC
Bilirubin Urine: NEGATIVE
Glucose, UA: NEGATIVE mg/dL
Hgb urine dipstick: NEGATIVE
Ketones, ur: NEGATIVE mg/dL
LEUKOCYTES UA: NEGATIVE
NITRITE: NEGATIVE
PROTEIN: NEGATIVE mg/dL
SPECIFIC GRAVITY, URINE: 1.029 (ref 1.005–1.030)
pH: 5 (ref 5.0–8.0)

## 2018-07-11 LAB — BLOOD PRODUCT ORDER (VERBAL) VERIFICATION

## 2018-07-11 LAB — MRSA PCR SCREENING: MRSA by PCR: NEGATIVE

## 2018-07-11 LAB — PROTIME-INR
INR: 1.5
PROTHROMBIN TIME: 18 s — AB (ref 11.4–15.2)

## 2018-07-11 LAB — CORTISOL: Cortisol, Plasma: 48.3 ug/dL

## 2018-07-11 LAB — TRIGLYCERIDES: TRIGLYCERIDES: 102 mg/dL (ref <150)

## 2018-07-11 LAB — GLUCOSE, CAPILLARY
GLUCOSE-CAPILLARY: 115 mg/dL — AB (ref 70–99)
GLUCOSE-CAPILLARY: 142 mg/dL — AB (ref 70–99)
Glucose-Capillary: 123 mg/dL — ABNORMAL HIGH (ref 70–99)
Glucose-Capillary: 196 mg/dL — ABNORMAL HIGH (ref 70–99)

## 2018-07-11 LAB — HEMOGLOBIN AND HEMATOCRIT, BLOOD
HEMATOCRIT: 44.9 % (ref 36.0–46.0)
HEMATOCRIT: 41.8 % (ref 36.0–46.0)
HEMATOCRIT: 40.7 % (ref 36.0–46.0)
HEMOGLOBIN: 13.7 g/dL (ref 12.0–15.0)
Hemoglobin: 14.9 g/dL (ref 12.0–15.0)
Hemoglobin: 13.5 g/dL (ref 12.0–15.0)

## 2018-07-11 LAB — PREALBUMIN: PREALBUMIN: 6 mg/dL — AB (ref 18–38)

## 2018-07-11 LAB — PROCALCITONIN
PROCALCITONIN: 72.83 ng/mL
Procalcitonin: 28.47 ng/mL

## 2018-07-11 LAB — PHOSPHORUS: Phosphorus: 4.6 mg/dL (ref 2.5–4.6)

## 2018-07-11 LAB — MAGNESIUM: Magnesium: 2 mg/dL (ref 1.7–2.4)

## 2018-07-11 MED ORDER — TRAVASOL 10 % IV SOLN
INTRAVENOUS | Status: AC
  Administered 2018-07-11: 17:00:00 via INTRAVENOUS
  Filled 2018-07-11: qty 676.8

## 2018-07-11 MED ORDER — METRONIDAZOLE IN NACL 5-0.79 MG/ML-% IV SOLN
500.0000 mg | Freq: Three times a day (TID) | INTRAVENOUS | Status: DC
  Administered 2018-07-11 – 2018-07-14 (×9): 500 mg via INTRAVENOUS
  Filled 2018-07-11 (×9): qty 100

## 2018-07-11 MED ORDER — POLYETHYLENE GLYCOL 3350 17 G PO PACK
17.0000 g | PACK | Freq: Every day | ORAL | Status: DC
  Administered 2018-07-11 – 2018-07-13 (×3): 17 g via JEJUNOSTOMY
  Filled 2018-07-11 (×3): qty 1

## 2018-07-11 MED ORDER — VANCOMYCIN HCL 10 G IV SOLR
1250.0000 mg | Freq: Once | INTRAVENOUS | Status: AC
  Administered 2018-07-11: 1250 mg via INTRAVENOUS
  Filled 2018-07-11: qty 1250

## 2018-07-11 MED ORDER — SENNOSIDES 8.8 MG/5ML PO SYRP
5.0000 mL | ORAL_SOLUTION | Freq: Every day | ORAL | Status: DC
  Administered 2018-07-11 – 2018-07-13 (×3): 5 mL via JEJUNOSTOMY
  Filled 2018-07-11 (×3): qty 5

## 2018-07-11 MED ORDER — MINERAL OIL RE ENEM
1.0000 | ENEMA | Freq: Every day | RECTAL | Status: DC | PRN
  Administered 2018-07-13: 1 via RECTAL
  Filled 2018-07-11 (×3): qty 1

## 2018-07-11 MED ORDER — ALBUMIN HUMAN 5 % IV SOLN
INTRAVENOUS | Status: AC
  Filled 2018-07-11: qty 250

## 2018-07-11 MED ORDER — VANCOMYCIN HCL IN DEXTROSE 1-5 GM/200ML-% IV SOLN
1000.0000 mg | INTRAVENOUS | Status: DC
  Administered 2018-07-12 – 2018-07-13 (×2): 1000 mg via INTRAVENOUS
  Filled 2018-07-11 (×3): qty 200

## 2018-07-11 MED ORDER — LACTATED RINGERS IV BOLUS
1000.0000 mL | Freq: Once | INTRAVENOUS | Status: AC
  Administered 2018-07-11: 1000 mL via INTRAVENOUS

## 2018-07-11 MED ORDER — NOREPINEPHRINE 16 MG/250ML-% IV SOLN
0.0000 ug/min | INTRAVENOUS | Status: DC
  Administered 2018-07-11: 33 ug/min via INTRAVENOUS
  Administered 2018-07-12: 22 ug/min via INTRAVENOUS
  Administered 2018-07-13: 2 ug/min via INTRAVENOUS
  Filled 2018-07-11 (×2): qty 250

## 2018-07-11 MED ORDER — CHLORHEXIDINE GLUCONATE 0.12 % MT SOLN
15.0000 mL | Freq: Two times a day (BID) | OROMUCOSAL | Status: DC
  Administered 2018-07-11 – 2018-07-15 (×7): 15 mL via OROMUCOSAL
  Filled 2018-07-11 (×5): qty 15

## 2018-07-11 MED ORDER — BISACODYL 10 MG RE SUPP
10.0000 mg | Freq: Two times a day (BID) | RECTAL | Status: DC
  Administered 2018-07-11 – 2018-07-13 (×4): 10 mg via RECTAL
  Filled 2018-07-11 (×4): qty 1

## 2018-07-11 MED ORDER — PHENYLEPHRINE HCL-NACL 40-0.9 MG/250ML-% IV SOLN
0.0000 ug/min | INTRAVENOUS | Status: DC
  Administered 2018-07-11: 180 ug/min via INTRAVENOUS
  Administered 2018-07-11: 400 ug/min via INTRAVENOUS
  Administered 2018-07-11: 300 ug/min via INTRAVENOUS
  Administered 2018-07-11: 400 ug/min via INTRAVENOUS
  Filled 2018-07-11 (×4): qty 250

## 2018-07-11 MED ORDER — SODIUM CHLORIDE 0.9 % IV SOLN
3.0000 g | Freq: Four times a day (QID) | INTRAVENOUS | Status: DC
  Filled 2018-07-11 (×3): qty 3

## 2018-07-11 MED ORDER — NOREPINEPHRINE 4 MG/250ML-% IV SOLN
0.0000 ug/min | INTRAVENOUS | Status: DC
  Administered 2018-07-11: 2 ug/min via INTRAVENOUS
  Administered 2018-07-11: 33 ug/min via INTRAVENOUS
  Administered 2018-07-11: 24 ug/min via INTRAVENOUS
  Administered 2018-07-11: 32 ug/min via INTRAVENOUS
  Filled 2018-07-11 (×3): qty 250

## 2018-07-11 MED ORDER — NOREPINEPHRINE 4 MG/250ML-% IV SOLN
INTRAVENOUS | Status: AC
  Administered 2018-07-11: 2 ug/min via INTRAVENOUS
  Filled 2018-07-11: qty 250

## 2018-07-11 MED ORDER — SODIUM CHLORIDE 0.9 % IV SOLN
2.0000 g | INTRAVENOUS | Status: DC
  Administered 2018-07-11 – 2018-07-13 (×3): 2 g via INTRAVENOUS
  Filled 2018-07-11 (×4): qty 2

## 2018-07-11 MED ORDER — MINERAL OIL RE ENEM
1.0000 | ENEMA | Freq: Once | RECTAL | Status: AC
  Administered 2018-07-11: 1 via RECTAL
  Filled 2018-07-11: qty 1

## 2018-07-11 MED ORDER — ORAL CARE MOUTH RINSE
15.0000 mL | Freq: Two times a day (BID) | OROMUCOSAL | Status: DC
  Administered 2018-07-11 – 2018-07-14 (×7): 15 mL via OROMUCOSAL

## 2018-07-11 MED ORDER — ALBUMIN HUMAN 5 % IV SOLN
25.0000 g | Freq: Once | INTRAVENOUS | Status: AC
  Administered 2018-07-11: 25 g via INTRAVENOUS
  Filled 2018-07-11: qty 250

## 2018-07-11 NOTE — Progress Notes (Addendum)
Addendum: Concern for intra-abdominal source as well and s/p GI surgery >> to broaden to Vancomycin + Cefepime + Flagyl. SCr doubled today - will dose accordingly. PCT elevated. WBC wnl but up from 3.8. LA elevated 3.1. Hypothermic.   Plan: D/C Unasyn.  Cefepime 2g IV every 24 hours. Vancomycin 1250 mg IV x1 then 1000 mg IV every 24 hours.  Continue Flagyl 500 mg IV every 8 hours.   Sloan Leiter, PharmD, BCPS, BCCCP Clinical Pharmacist Clinical phone 07/11/2018 until 3:30PM - #[phone] Please refer to [facility] for [place] numbers 07/11/2018, 1:13 PM    Pharmacy Antibiotic Note  Angela [name] is a 82 y.o. female admitted on 06/10/2018 with pneumonia.  Pharmacy has been consulted for unasyn dosing. SCr up today but CrCl remains above 30 mL/min. Due to possible pneumonia and length of stay, will keep at pneumonia dosing of q6h.   Plan: Increase Unasyn 3gm IV q6 hours F/u renal function, cultures and clinical course  Height: 5\' 1"  (154.9 cm) Weight: 142 lb 3.2 oz (64.5 kg) IBW/kg (Calculated) : 47.8  Temp (24hrs), Avg:97.4 F (36.3 C), Min:97 F (36.1 C), Max:98 F (36.7 C)  Recent Labs  Lab 07/06/18 0843 07/07/18 0402 07/08/18 0823 07/10/18 0406 07/10/18 1827 07/11/18 0120 07/11/18 0300 07/11/18 0752  WBC  --  6.2 8.2  --  3.8* 3.5*  --  7.7  CREATININE 1.18* 0.71 0.54 0.46  --   --   --  1.16*  LATICACIDVEN  --   --   --   --   --   --  3.3* 3.1*    Estimated Creatinine Clearance: 33.8 mL/min (A) (by C-G formula based on SCr of 1.16 mg/dL (H)).    Allergies  Allergen Reactions  . Peanut-Containing Drug Products Anaphylaxis  . Reglan [Metoclopramide] Other (See Comments)    "Messed my mind up"  . Clindamycin/Lincomycin Diarrhea  . Codeine Nausea Only  . Iohexol Swelling and Other (See Comments)     Desc: pt received hypaque 60% in 1994 and had erythema, hives & lips swell.  She did ok w/o premeds in 8/05 at [facility].  Pt. takes daily prednisone for rheumatoid arthritis.   Poor venous access today (7/7/6) so we did her w/o iv contrast.   . Pineapple Swelling  . Shellfish Allergy Swelling and Other (See Comments)    Lips swell    Thank you for allowing pharmacy to be a part of this patient's care.  Sloan Leiter, PharmD, BCPS, BCCCP Clinical Pharmacist Clinical phone 07/11/2018 until 3:30PM- #[phone] Please refer to [facility] for [place] numbers 07/11/2018 11:38 AM

## 2018-07-11 NOTE — Significant Event (Addendum)
Rapid Response Event Note  Patient transferred to 41M10, SBP improved into the mid 90s, MAP < 60. PRBC X 1 was infusing still infusing, completed on arrival to 41M. Patient tolerated transfer okay, still endorsed pain in her abdomen during transfer.  Patient was weaned off NRB once in the ICU and G tube was placed back to suction.

## 2018-07-11 NOTE — Progress Notes (Addendum)
Angela [name]  ERD:[id number] DOB: 1936/02/14 DOA: 06/05/2018 PCP: [name], MD    LOS: 11 days   Reason for Consult / Chief Complaint:  Hypotension  Consulting MD and date of consult:  Dr. Sarajane Jews 07/10/2018  HPI/summary of hospital stay:  82 year old female with PMH significant for PUD with resultant severe pyloric stenosis with prior dilation attempts by EGD (4 since March, last EGD 8/13 which did not show malignancy) with ongoing weight loss (>25lbs over several months), and malnutrition with prior recommendations for a Jtube, PAF, HLD, HTN, CAD, and RA admitted 8/25 to [facility] for worsening nausea, increasing weakness, constipation, hyponatremia and dehydration. She was treated with ceftriaxone for UTI. She remained severely constipated despite medical management and CT abd showed severe gastric distention due to severe pyloric obstruction.  Surgery and GI was consulted.  She was decompressed with an NGT. Additional attempts at dilation felt likely not to help given her chronic gastric outlet obstruction.  Cardiology consulted for pre-op clearance and on 8/30, she underwent a gastrojejunostomy with placement of gastrostomy and jejunostomy tube.  She has required transfusions for ABLA, last transfusion 8/31, and foley catheter for urinary retention.  She was tolerated feeding via Jtube but developed postoperative ileus and therefore tubefeedings held on 9/4. TPN started 9/5.  Patient having worsening abdominal distention and pain with increased output from Gtube.  Developed hypotension and acute bleeding from around Gtube in the evening of 9/5 requiring transfer to ICU for further stabilization.  PCCM consulted to resume medical management while in ICU.   Subjective  RN reports hypotension to the 60's despite 400 mcg neosynephrine.  Additional 1L LR given this am with CVP of 6. Afebrile.  Making urine, normal mental status.  Assessment & Plan:   Hypotension- likely multifactorial in  setting of adrenal insufficiency, hypovolemic shock related to high output from Gtube, with concern for possible hemorraghic shock from bleeding around Gtube - 1.2L of bilious output manually withdrawn by surgery and separate ~1850 ml output  P:  Monitor in ICU  Follow CBC, monitor for bleeding Change from neosynephrine to levophed  Follow CVP, additional 1L LR now  Continue stress dose steroids, note that lab was drawn after steroids given May need to match G-tube output with IVF replacement   R/o ABLA  - leaking from Gtube site with movement/ turning - s/p purse string sitch by surgery to help with bleeding from around Gtube; bleeding mostly noted from site with movement/ rolling,? Concern for internal bleeding P:  Follow Hgb trend  Consider repeat CT ABD, defer to CCS Transfuse per ICU guidelines   Postoperative adynamic ileus s/p open gastrojejunostomy for gastric obstruction 2/2 severe pyloric stenosis r/t PUD that has not been responsive to dilation and EGD - apparently had clear/ brown tinged liquid BM with lots of flatulence upon transfer to ICU  P:  Per CCS, GI  G-Tube to suction  Goal K >4, Mg > 2 Escalate abx to include abdominal coverage   Acute Hypoxic Respiratory Failure  - concern for aspiration - acute onset with hypotension on floor with sats in the 80's on NRB, currently now on VM - likely secondary to pain and abd distended, sats improved after manual decompression by surgery with 1.2L out Gtube P:  O2 to support sats > 90% Pulmonary hygiene - IS, mobilize  Trend PCT  Empiric aspiration coverage, CXR with mild LLL airspace disease (?effusion vs aspiration). ABX escalated as above for abdominal coverage as  well.    Compensated NAG Metabolic Acidosis - ? Saline contribution, RTA? P: Monitor  Received bicarb push overnight  PAF  P:  Hold apixaban (last dose 8/28) Monitor tele  Continue amiodarone Hold propranolol   Protein Calorie Malnutrition P:    Continue TPN per pharmacy   Urinary Retention - s/p catheter placement P:  Monitor UOP   Rheumatoid Arthritis  P:  Hold home prednisone while on stress dose steroids  Hold methotrexate  Goals of Care  P: ? Long term recovery, critically ill with multiple baseline medical problems Remains full code  Best Practice / Goals of Care / Disposition.   DVT prophylaxis: SCDs GI prophylaxis: pepcid  Diet: NPO/ TPN Mobility: bedrest Code Status: Full  Family Communication: patient & son-in-law updated on plan of care 9/6  Disposition / Summary of Today's Plan 07/11/18   ICU monitoring, continue stress dose steroids.  Empiric aspiration coverage but CXR does not show significant infiltrate (but she is immune suppressed).  Consider repeat CT abd imaging if further concern for bleeding. Monitor Hgb (has been stable)   Consultants: date of consult/date signed off (if applicable)/final recs  Cards- for preop clearance- signed off  Surgery and GI following   Procedures: 8/30 open gastrojejunostomy with gastrostomy and jejunostomy tube placement  Significant Diagnostic Tests: 8/27 CT abd/ pelvis >> Massively distended stomach with food and fluid present. This most likely is due to recurrence of severe pyloric stenosis by history. Circumferential thickening of the a no rectal mucosa of questionable significance. Correlate clinically. Degenerative disc disease at L3-4 and L4-5 as noted above. Moderate-sized bilateral pleural effusions with bibasilar atelectasis. Cardiomegaly.  9/5 AXR >> Continued massive dilatation of the stomach. Increased dilatation of small bowel loops. No free intraperitoneal air.  Micro Data: 8/25 UC  >> multiple species 9/5 BCx 2 >> 9/5 MRSA PCR >>  Antimicrobials:  8/25 Ceftriaxone >> 8/27 8/30 Cefotetan x 1 preop Unasyn 9/5 >> 9/6 Vanco 9/6 >> Cefepime 9/6 >>  Flagyl 9/6 >>   Objective    Examination: General: chronically ill appearing female lying in  bed HEENT: MM pink/dry, JVD+ Neuro: AAOx4, speech clear, MAE  CV: s1s2 rrr, no m/r/g PULM: even/non-labored, lungs bilaterally clear anterior, diminished bases  GI: protuberant, midline surgical incision with staples, GJ drain with brown fluid draining  Extremities: warm/dry, 1-2+ generalized edema  Skin: no rashes or lesions  Blood pressure (!) 67/49, pulse 87, temperature 98 F (36.7 C), temperature source Axillary, resp. rate (!) 23, height 5\' 1"  (1.549 m), weight 64.5 kg, SpO2 99 %. CVP:  [2 mmHg-6 mmHg] 6 mmHg     Intake/Output Summary (Last 24 hours) at 07/11/2018 0950 Last data filed at 07/11/2018 0636 Gross per 24 hour  Intake 2307.32 ml  Output 4125 ml  Net -1817.68 ml   Filed Weights   07/09/18 1700 07/10/18 0541 07/11/18 0500  Weight: 62.8 kg 64.4 kg 64.5 kg     Labs    CBC: Recent Labs  Lab 07/07/18 0402 07/08/18 0823 07/10/18 1827 07/10/18 2322 07/11/18 0120 07/11/18 0752  WBC 6.2 8.2 3.8*  --  3.5* 7.7  NEUTROABS  --   --   --   --   --  6.8  HGB 8.9* 10.1* 10.9* 14.9 15.7* 13.9  HCT 26.6* 30.5* 33.5* 44.9 46.9* 40.3  MCV 101.1* 102.3* 102.4*  --  96.7 94.8  PLT 154 197 210  --  155 720*   Basic Metabolic Panel: Recent Labs  Lab 07/06/18  2956 07/07/18 0402 07/08/18 0823 07/10/18 0406 07/11/18 0752  NA 135 137 134* 132* 137  K 3.4* 4.4 4.3 4.4 5.1  CL 105 109 106 105 107  CO2 23 24 23  19* 19*  GLUCOSE 161* 120* 110* 122* 297*  BUN 29* 23 13 14  25*  CREATININE 1.18* 0.71 0.54 0.46 1.16*  CALCIUM 8.0* 7.9* 7.8* 7.9* 7.5*  MG  --   --   --  1.8 2.0  PHOS  --   --   --  3.3 4.6   GFR: Estimated Creatinine Clearance: 33.8 mL/min (A) (by C-G formula based on SCr of 1.16 mg/dL (H)). Recent Labs  Lab 07/08/18 0823 07/10/18 1827 07/11/18 0120 07/11/18 0300 07/11/18 0752  PROCALCITON  --   --  28.47  --  72.83  WBC 8.2 3.8* 3.5*  --  7.7  LATICACIDVEN  --   --   --  3.3* 3.1*   Liver Function Tests: Recent Labs  Lab 07/11/18 0752  AST 21   ALT 15  ALKPHOS 20*  BILITOT 2.5*  PROT 3.6*  ALBUMIN 2.2*   No results for input(s): LIPASE, AMYLASE in the last 168 hours. No results for input(s): AMMONIA in the last 168 hours.   ABG    Component Value Date/Time   PHART 7.383 07/10/2018 2045   PCO2ART 27.5 (L) 07/10/2018 2045   PO2ART 56.5 (L) 07/10/2018 2045   HCO3 16.0 (L) 07/10/2018 2045   TCO2 25 06/17/2013 1128   ACIDBASEDEF 8.1 (H) 07/10/2018 2045   O2SAT 88.9 07/10/2018 2045    Coagulation Profile: Recent Labs  Lab 07/11/18 0121  INR 1.50   Cardiac Enzymes: Recent Labs  Lab 07/10/18 1849  TROPONINI <0.03   HbA1C: Hgb A1c MFr Bld  Date/Time Value Ref Range Status  08/06/2009 10:53 PM  4.6 - 6.1 % Final   5.2 (NOTE) The ADA recommends the following therapeutic goal for glycemic control related to Hgb A1c measurement: Goal of therapy: <6.5 Hgb A1c  Reference: American Diabetes Association: Clinical Practice Recommendations 2010, Diabetes Care, 2010, 33: (Suppl  1).   CBG: Recent Labs  Lab 07/08/18 2020 07/09/18 0814 07/10/18 2039 07/10/18 2358 07/11/18 0607  GLUCAP 112* 85 120* 142* 64*    Noe Gens, NP-C  Pulmonary & Critical Care Pgr: [phone] or if no answer [phone] 07/11/2018, 9:50 AM  Attending Note:  82 year old female with severe RA on prednisone and methotrexate who presents to PCCM with circulatory shock and hypoxemia.  Patient was transferred to the ICU for septic shock management.  On exam, abdomen is severely distended with decreased BS at the bases.  I reviewed AXR myself, severe distension with very distended stomach.  Discussed with PCCM-NP.  Will broaden abx coverage to include HCAP coverage (patient has been in the hospital >72 hours) to cefepime/vanc/flagyl.  F/u on cultures.  G-tube to suction.  J tube for feeding.  Albumin ordered for hypovolemic shock.  Add vasopressin.  Stress dose steroids ordered.  PCCM will continue to follow.  Extensive discussion with patient.   She is clearly able to make her own decision.  After discussion, she elected no CPR/cardioversion/intubation.  Will make patient LCB with no CPR/cardioversion.  The patient is critically ill with multiple organ systems failure and requires high complexity decision making for assessment and support, frequent evaluation and titration of therapies, application of advanced monitoring technologies and extensive interpretation of multiple databases.   Critical Care Time devoted to patient care services described in this  note is  60  Minutes. This time reflects time of care of this signee Dr Jennet Maduro. This critical care time does not reflect procedure time, or teaching time or supervisory time of PA/NP/Med student/Med Resident etc but could involve care discussion time.  Rush Farmer, M.D. [facility] Pulmonary/Critical Care Medicine. Pager: [phone]. After hours pager: [phone].

## 2018-07-11 NOTE — Patient Outreach (Signed)
[facility]) Care Management  07/11/2018  Angela [name] [birth year] [id number]   82 year old female referred to [place] [facility] for medication management and community resources on 06/27/2018.  Patient admitted to [facility] on 06/15/2018 and remains hospitalized.  [facility] inpatient liaison will re-refer patient for [facility] services as appropriate at discharge.   Plan: Close [facility] pharmacy case per workflow as patient hospitalized > 10 days.   Ralene Bathe, PharmD, [place] [phone]

## 2018-07-11 NOTE — Progress Notes (Signed)
CRITICAL VALUE ALERT  Critical Value: Lactic Acid 3.3  Date & Time Notied: 07/11/18 0353   Provider Notified: Dr. Lucile Shutters  Orders Received/Actions taken: No new orders given at this time.

## 2018-07-11 NOTE — Progress Notes (Signed)
Physical Therapy Treatment Patient Details Name: Angela [name] MRN: [id number] DOB: 10-Nov-1935 Today's Date: 07/11/2018    History of Present Illness 82 year old with past medical history of severe pyloric stenosis, CAD, atrial fibrillation, hyperlipidemia, osteoporosis, peptic ulcer disease, rheumatoid arthritis admitted with abdominal pain.  CT of the abdomen pelvis showed severe gastric distention due to severe pyloric obstruction, s/p Open gastrojejunostomy, with gastrostomy and feeding jejunostomy tubes placement on 07/01/2018    PT Comments    Pt agreeable to therapy today,even wanting to get up, however pt BP was 64/40 on entry and her abdominal binder had been soiled overnight. RN notified and ordered new one but did not arrive during therapy session. Pt able to participate in AAROM of UE and LE. PT will continue to follow and progress mobility.    Follow Up Recommendations  SNF;Supervision/Assistance - 24 hour     Equipment Recommendations  Other (comment)(TBA)       Precautions / Restrictions Precautions Precautions: Fall Required Braces or Orthoses: Other Brace/Splint Other Brace/Splint: abdominal binder Restrictions Weight Bearing Restrictions: No    Mobility  Bed Mobility               General bed mobility comments: Did not attempt mobility today                     Cognition Arousal/Alertness: Awake/alert Behavior During Therapy: WFL for tasks assessed/performed Overall Cognitive Status: Within Functional Limits for tasks assessed                                        Exercises General Exercises - Upper Extremity Shoulder ABduction: AAROM;Both;5 reps;Supine Wrist Flexion: AAROM;Both;Supine;10 reps Wrist Extension: AAROM;Both;10 reps Digit Composite Flexion: AAROM;Both;10 reps;Supine General Exercises - Lower Extremity Straight Leg Raises: AAROM;Both;5 reps;Supine Low Level/ICU Exercises Ankle Circles/Pumps: AROM;Both;10  reps;Supine Hip ABduction/ADduction: AAROM;Both;10 reps;Supine Heel Slides: AROM;Both;10 reps;Supine Shoulder Flexion: AAROM;Both;10 reps;Supine Elbow Flexion: AAROM;Both;10 reps;Supine    General Comments General comments (skin integrity, edema, etc.): Mobility deferred as pt BP 64/40 and pt no longer had a binder in the room as it had been soiled. RN ordered replacement      Pertinent Vitals/Pain Pain Assessment: Faces Faces Pain Scale: Hurts a little bit Pain Location: across abdomen  Pain Descriptors / Indicators: Operative site guarding;Grimacing;Guarding Pain Intervention(s): Limited activity within patient's tolerance;Monitored during session;Repositioned           PT Goals (current goals can now be found in the care plan section) Acute Rehab PT Goals Patient Stated Goal: agreeable to rehab at [facility] PT Goal Formulation: With patient Time For Goal Achievement: 07/23/18 Potential to Achieve Goals: Good Progress towards PT goals: Not progressing toward goals - comment(low BP )    Frequency    Min 3X/week      PT Plan Current plan remains appropriate       AM-PAC PT "6 Clicks" Daily Activity  Outcome Measure  Difficulty turning over in bed (including adjusting bedclothes, sheets and blankets)?: Unable Difficulty moving from lying on back to sitting on the side of the bed? : Unable Difficulty sitting down on and standing up from a chair with arms (e.g., wheelchair, bedside commode, etc,.)?: Unable Help needed moving to and from a bed to chair (including a wheelchair)?: Total Help needed walking in hospital room?: Total Help needed climbing 3-5 steps with a railing? : Total 6 Click Score: 6  End of Session   Activity Tolerance: Patient limited by fatigue Patient left: in bed;with call bell/phone within reach;with family/visitor present Nurse Communication: Mobility status PT Visit Diagnosis: Other abnormalities of gait and mobility (R26.89);Muscle weakness  (generalized) (M62.81)     Time: 1740-8144 PT Time Calculation (min) (ACUTE ONLY): 26 min  Charges:  $Therapeutic Exercise: 8-22 mins                     Kenji Mapel B. Migdalia Dk PT, DPT Acute Rehabilitation Services Pager [phone] Office [phone]    [place] 07/11/2018, 5:25 PM

## 2018-07-11 NOTE — Progress Notes (Signed)
eLink Physician-Brief Progress Note Patient Name: Angela [name] DOB: 07-19-1936 MRN: [id number]   Date of Service  07/11/2018  HPI/Events of Note  Hypotension and elevated Lactic acid level at 3.3  eICU Interventions  500 ml 5 % Albumin fluid bolus over two hours x 1 If BP remains low patient may need to be intubated to permit more aggressive volume resuscitation.        Kerry Kass Legaci Tarman 07/11/2018, 4:37 AM

## 2018-07-11 NOTE — Patient Outreach (Signed)
'  [facility]) Care Management  07/11/2018  Angela [name] [birth year] [id number]   Referral received 8/23 for [facility] community care coordinator. Patient has remained inpatient for 10 days or greater. Received in basket message from [facility] hospital liaison .   Plan Will await disposition update from [facility].    Joylene Draft, RN, [place] Management Coordinator  [phone]- Mobile [phone]- Toll Free Main Office

## 2018-07-11 NOTE — Progress Notes (Addendum)
[facility] Progress Note  7 Days Post-Op  Subjective: CC:  States abd pain and nausea improved compared to last night. C/o some SOB. Reports small BM yesterday. States when she got up to the chair last night with nursing staff she had another brief syncopal episode.  Objective: Vital signs in last 24 hours: Temp:  [97 F (36.1 C)-98 F (36.7 C)] 98 F (36.7 C) (09/06 0748) Pulse Rate:  [45-99] 82 (09/06 0715) Resp:  [10-40] 25 (09/06 0715) BP: (68-125)/(43-114) 72/59 (09/06 0715) SpO2:  [83 %-100 %] 96 % (09/06 0715) Weight:  [64.5 kg] 64.5 kg (09/06 0500) Last BM Date: 07/10/18  Intake/Output from previous day: 09/05 0701 - 09/06 0700 In: 2307.3 [P.O.:60; I.V.:1194.9; Blood:574; IV Piggyback:158.5] Out: 4125 [Urine:525; Drains:3600] Intake/Output this shift: No intake/output data recorded.  PE: Gen:  Alert, NAD, cooperative Card:  Regular rate and rhythm, 2+ pitting edema BLE Pulm:  Mildly labored, clear to auscultation bilaterally with diminished breath sounds L>R lung bases, no crackles, no wheezes. Abd: Soft, mild global tenderness, G-tube to LIWS, J-tube clamped, midline staples clean and dry.   G-tube: 3,600cc/24h, brown  Skin: warm and dry, no rashes  Psych: A&Ox3   Lab Results:  Recent Labs    07/10/18 1827 07/10/18 2322 07/11/18 0120  WBC 3.8*  --  3.5*  HGB 10.9* 14.9 15.7*  HCT 33.5* 44.9 46.9*  PLT 210  --  155   BMET Recent Labs    07/08/18 0823 07/10/18 0406  NA 134* 132*  K 4.3 4.4  CL 106 105  CO2 23 19*  GLUCOSE 110* 122*  BUN 13 14  CREATININE 0.54 0.46  CALCIUM 7.8* 7.9*   PT/INR Recent Labs    07/11/18 0121  LABPROT 18.0*  INR 1.50   CMP     Component Value Date/Time   NA 132 (L) 07/10/2018 0406   NA 135 06/09/2018 1324   K 4.4 07/10/2018 0406   CL 105 07/10/2018 0406   CO2 19 (L) 07/10/2018 0406   GLUCOSE 122 (H) 07/10/2018 0406   BUN 14 07/10/2018 0406   BUN 13 06/09/2018 1324   CREATININE 0.46  07/10/2018 0406   CREATININE 0.60 05/25/2016 1252   CALCIUM 7.9 (L) 07/10/2018 0406   PROT 4.9 (L) 06/24/2018 1148   ALBUMIN 3.4 (L) 07/02/2018 1148   AST 65 (H) 06/21/2018 1148   ALT 32 06/13/2018 1148   ALKPHOS 28 (L) 06/30/2018 1148   BILITOT 2.2 (H) 06/17/2018 1148   GFRNONAA >60 07/10/2018 0406   GFRAA >60 07/10/2018 0406   Lipase     Component Value Date/Time   LIPASE 34 06/16/2018 1148       Studies/Results: Dg Abd 1 View  Result Date: 07/10/2018 CLINICAL DATA:  82 year old female with chronic gastric outlet obstruction due to peptic ulcer disease related severe pyloric stenosis. Postoperative day 6 from placement of gastrostomy and jejunostomy tubes. EXAM: ABDOMEN - 1 VIEW COMPARISON:  07/09/2018 and earlier, including the preoperative CT 07/01/2018. FINDINGS: Continued contrast mixed with food debris in what appears to be a massively dilated stomach. A left upper quadrant gastrostomy tube is visible. A jejunostomy tube projects over the lower portion of the distal stomach. There is some retained oral contrast in nondilated transverse colon and hepatic flexure. Gas-filled nondilated bowel loops elsewhere. No pneumoperitoneum is evident. Stable cholecystectomy clips. Stable visible lung bases. Osteopenia. No acute osseous abnormality identified. IMPRESSION: 1. Continued massively dilated stomach with gastric contrast mixed with food debris. Dilated gastric  antrum simulates the appearance of dilated right colon. See CT Abdomen and Pelvis 07/01/2018. 2. Nondilated hepatic flexure and transverse colon containing oral contrast. Gas-filled nondilated bowel loops elsewhere may reflect ileus. Electronically Signed   By: Genevie Ann M.D.   On: 07/10/2018 08:59   Dg Abd 1 View  Result Date: 07/09/2018 CLINICAL DATA:  R14.0 (ICD-10-CM) - Abdominal distension K91.89,K56.7 (ICD-10-CM) - Ileus following gastrointestinal surgery EXAM: ABDOMEN - 1 VIEW COMPARISON:  07/08/2018 and older exams. FINDINGS:  There is right colon dilation, which measures 12 cm on this magnified AP view, without significant change from previous day's study. Residual contrast mixed with stool is noted throughout the colon. Stomach is also distended similar to the previous day's exam. There are loops of mildly dilated small bowel. Post surgical drainage tubes are stable. IMPRESSION: 1. Stable appearance from the previous day's exam. Distended stomach and bowel, most prominently the right colon, is most consistent with a postoperative adynamic ileus. Electronically Signed   By: Lajean Manes M.D.   On: 07/09/2018 11:12   Dg Chest Port 1 View  Result Date: 07/10/2018 CLINICAL DATA:  Shock EXAM: PORTABLE CHEST 1 VIEW COMPARISON:  06/15/2018 FINDINGS: Patient has a LEFT-sided PICC line with tip to LOWER superior vena cava-RIGHT atrium. The heart is enlarged. There are patchy perihilar airspace filling opacities. More confluent opacity at the LEFT lung base is consistent with consolidation or atelectasis. Partially imaged contrast within bowel loops in the UPPER abdomen. IMPRESSION: 1. Perihilar pulmonary edema. 2. LEFT LOWER lobe opacity consistent with atelectasis or consolidation, new since the previous exams. Question of aspiration pneumonia. 3. LEFT-sided PICC line tip to the LOWER superior vena cava/UPPER RIGHT atrium. Electronically Signed   By: Nolon Nations M.D.   On: 07/10/2018 20:37   Dg Abd Portable 1v-small Bowel Obstruction Protocol-initial, 8 Hr Delay  Result Date: 07/10/2018 CLINICAL DATA:  Small-bowel obstruction EXAM: PORTABLE ABDOMEN - 1 VIEW COMPARISON:  07/10/2018 at 18:16 hours FINDINGS: Some interval decompression of the stomach with retained oral contrast seen within. Moderate gaseous distention of small bowel loops noted in the left lower quadrant persist though less prominent since prior with contrast filled stable moderate dilatation of small bowel loops the mid pelvis and right lower quadrant. A large amount  of stool is seen within the cecum through transverse colon. No significant antegrade IMPRESSION: Interval decompression of the stomach and gas-filled left lower quadrant small bowel loops since prior. No significant antegrade progression of ingested oral contrast. Significant stool retention within the colon from cecum through splenic flexure. Electronically Signed   By: Ashley Royalty M.D.   On: 07/10/2018 23:47   Dg Abd Portable 1v  Result Date: 07/10/2018 CLINICAL DATA:  Abdominal pain.  Bleeding from PEG. EXAM: PORTABLE ABDOMEN - 1 VIEW COMPARISON:  07/10/2018 FINDINGS: Surgical clips overlie the midline of the abdomen. Surgical drains are identified in the LEFT mid abdomen. Persistent marked dilatation of the stomach. There is increased dilatation of contrast containing small bowel loops in the RIGHT hemipelvis. Numerous dilated gas-filled loops are also identified in the LEFT abdomen and pelvis. Nondilated transverse colon contains contrast. There is opacity at the LEFT lung base, obscuring the hemidiaphragm. No free intraperitoneal air. IMPRESSION: 1. Continued massive dilatation of the stomach. 2. Increased dilatation of small bowel loops. 3. No free intraperitoneal air. Electronically Signed   By: Nolon Nations M.D.   On: 07/10/2018 18:38   [place] Ekg Site Rite  Result Date: 07/10/2018 If [employer] not attached,  placement could not be confirmed due to current cardiac rhythm.   Anti-infectives: Anti-infectives (From admission, onward)   Start     Dose/Rate Route Frequency Ordered Stop   07/10/18 2300  Ampicillin-Sulbactam (UNASYN) 3 g in sodium chloride 0.9 % 100 mL IVPB     3 g 200 mL/hr over 30 Minutes Intravenous Every 8 hours 07/10/18 2255     06/18/2018 0915  cefoTEtan (CEFOTAN) 1 g in sodium chloride 0.9 % 100 mL IVPB     1 g 200 mL/hr over 30 Minutes Intravenous To Surgery 06/05/2018 0830 06/10/2018 1132   06/30/18 0645  cefTRIAXone (ROCEPHIN) 1 g in sodium chloride 0.9 % 100 mL IVPB   Status:  Discontinued     1 g 200 mL/hr over 30 Minutes Intravenous Every 24 hours 06/30/18 0643 07/02/18 1655   07/05/2018 1500  cefTRIAXone (ROCEPHIN) 1 g in sodium chloride 0.9 % 100 mL IVPB  Status:  Discontinued     1 g 200 mL/hr over 30 Minutes Intravenous Every 24 hours 07/03/2018 1451 06/10/2018 1528       Assessment/Plan CAD s/p stent in 2008, not on ASA due to frequent ulcers SVT HTN Atrial fibrillation PUD  Gastroparesis and GOO Rheumatoid arthritis/Spondylolisthesis/Chronic pain syndrome Osteoporosis UTI - on IV rocephin  Constipation   Gastric outlet obstruction secondary to severe pyloric stenosis - EGD with bx 8/13 did not show malignancy, pyloric stenosis most likely due to PUD - past balloon dilations 01/10/18, 01/24/18, 04/15/18, 06/17/18 - all by Dr. Therisa Doyne  POD#7S/POpengastrojejunostomy, with gastrostomy andfeedingjejunostomy tubes - 07/04/18 - Dr. Georgette Dover - Post-operative ileus along with poor gastric emptying 2/2 chronic GOO, and constipation - AXR with significant amount of retained food in the patients distended stomach - stomach irrigated and suctioned evening of 9/5, G-tube reinforced with a pursestring suture by BT. - SB protocol per J-tube 9/5 shows interval decompression of the stomach but significant constipation - recommend trying an enema today.  -Continuegastrostomy to LIWS -Continue to hold J-tube feeds and continue dulcolax  And miralax per J-tube;flush every 4 hours and before/after medication use. Liquid meds per tube preferred, meds can be finely crushed and administered if necessary.  Hypotension - suspect 2/2 significant GI losses; s/p fluid bolus and 2 u PRBC 9/5; remains hypotensive and on 2 pressors; per CCM Suspect aspiration PNA - LLL consolidation on CXR 9/5, per CCM  FEN:IVF, Hold TF, TNA  VTE: SCD's,heparin okay from a surgical standpoint ID:pre-op only Foley:in place for urinary retention, per medicine Follow up:Dr.  Georgette Dover  PLAN:appreciate CCM management of hypovolemic shock/possible aspiration, continue to hold tube feeds, continue TNA, miralax per J tube and enema today.     LOS: 11 days    Obie Dredge, [facility] Surgery Pager: [phone]

## 2018-07-11 NOTE — Consult Note (Signed)
   [facility] CM Inpatient Consult   07/11/2018  TEYA OTTERSON 23-Nov-1935 [id number]    [facility] Care Management follow up.   Chart reviewed. Noted patient was transferred to ICU. Will continue to follow along for disposition planning and progression. Will re-refer to [facility] team as appropriate as patient has been in hospital greater than 10 days.   Marthenia Rolling, MSN-Ed, RN,BSN [facility] Liaison [phone]

## 2018-07-11 NOTE — Progress Notes (Signed)
Subjective: The patient was seen and examined at bedside in ICU. Preceding events noted-bleeding from G-tube site with hypotension with leakage of gastric contents around G-tube. Dr. Grandville Silos placed a pursestring suture to seal it and used a Toomey syringe to withdraw 1200 mL of gastric contents from the G-tube. Patient subsequently transferred to ICU, received 2 units PRBC transfusion and is currently on IV phenylephrine and IV norepinephrine drip, continues to remain hypotensive.  Objective: Vital signs in last 24 hours: Temp:  [97 F (36.1 C)-98 F (36.7 C)] 98 F (36.7 C) (09/06 0748) Pulse Rate:  [45-99] 85 (09/06 1025) Resp:  [10-40] 24 (09/06 1025) BP: (62-125)/(43-114) 80/63 (09/06 1025) SpO2:  [83 %-100 %] 100 % (09/06 1025) Weight:  [64.5 kg] 64.5 kg (09/06 0500) Weight change: 1.7 kg Last BM Date: 07/10/18  VZ:CHYIF, elderly, thinly built,started on IV TPN via PICC line on the left forearm GENERAL:lying on bed, awake, seems to be in pain from abdominal distention ABDOMEN:distended, mild generalized tenderness, bowel sounds not audible G tube connected to low intermittent wall suction with around 150 mL of light brown fluid in the canister, J-tube clamped currently and feeding has been on hold EXTREMITIES:bilateral bipedal pitting edema, swelling of her right upper forearm with bruising  Lab Results: Results for orders placed or performed during the hospital encounter of 06/14/2018 (from the past 48 hour(s))  Basic metabolic panel     Status: Abnormal   Collection Time: 07/10/18  4:06 AM  Result Value Ref Range   Sodium 132 (L) 135 - 145 mmol/L   Potassium 4.4 3.5 - 5.1 mmol/L   Chloride 105 98 - 111 mmol/L   CO2 19 (L) 22 - 32 mmol/L   Glucose, Bld 122 (H) 70 - 99 mg/dL   BUN 14 8 - 23 mg/dL   Creatinine, Ser 0.46 0.44 - 1.00 mg/dL   Calcium 7.9 (L) 8.9 - 10.3 mg/dL   GFR calc non Af Amer >60 >60 mL/min   GFR calc Af Amer >60 >60 mL/min    Comment: (NOTE) The eGFR  has been calculated using the CKD EPI equation. This calculation has not been validated in all clinical situations. eGFR's persistently <60 mL/min signify possible Chronic Kidney Disease.    Anion gap 8 5 - 15    Comment: Performed at [facility] [address] [place], [place] [address]  Magnesium     Status: None   Collection Time: 07/10/18  4:06 AM  Result Value Ref Range   Magnesium 1.8 1.7 - 2.4 mg/dL    Comment: Performed at [facility] [address], [place], [place] [address]  Phosphorus     Status: None   Collection Time: 07/10/18  4:06 AM  Result Value Ref Range   Phosphorus 3.3 2.5 - 4.6 mg/dL    Comment: Performed at [facility] [address], [place], [place] [address]  CBC     Status: Abnormal   Collection Time: 07/10/18  6:27 PM  Result Value Ref Range   WBC 3.8 (L) 4.0 - 10.5 K/uL   RBC 3.27 (L) 3.87 - 5.11 MIL/uL   Hemoglobin 10.9 (L) 12.0 - 15.0 g/dL   HCT 33.5 (L) 36.0 - 46.0 %   MCV 102.4 (H) 78.0 - 100.0 fL   MCH 33.3 26.0 - 34.0 pg   MCHC 32.5 30.0 - 36.0 g/dL   RDW 15.8 (H) 11.5 - 15.5 %   Platelets 210 150 - 400 K/uL  Comment: Performed at [facility], [place] [address] [place], [place] [address]  Troponin I (q 6hr x 3)     Status: None   Collection Time: 07/10/18  6:49 PM  Result Value Ref Range   Troponin I <0.03 <0.03 ng/mL    Comment: Performed at [facility] [address], [place], [place] [address]  Prepare RBC     Status: None   Collection Time: 07/10/18  7:06 PM  Result Value Ref Range   Order Confirmation      ORDER PROCESSED BY BLOOD BANK Performed at [facility], [place] [address], [place], [place] [address]   Prepare RBC     Status: None   Collection Time: 07/10/18  7:06 PM  Result Value Ref Range   Order Confirmation      ORDER PROCESSED BY BLOOD BANK Performed at [facility], [place] [address], [place], [place] [address]   Type and screen [place]     Status:  None (Preliminary result)   Collection Time: 07/10/18  7:11 PM  Result Value Ref Range   ABO/RH(D) O POS    Antibody Screen NEG    Sample Expiration 07/13/2018    Unit Number [id number]    Blood Component Type RED CELLS,LR    Unit division 00    Status of Unit ISSUED,FINAL    Unit tag comment VERBAL ORDERS PER DR Farmer    Transfusion Status OK TO TRANSFUSE    Crossmatch Result COMPATIBLE    Unit Number [id number]    Blood Component Type RBC LR PHER1    Unit division 00    Status of Unit ISSUED,FINAL    Unit tag comment VERBAL ORDERS PER DR Glendo    Transfusion Status OK TO TRANSFUSE    Crossmatch Result COMPATIBLE    Unit Number [id number]    Blood Component Type RED CELLS,LR    Unit division 00    Status of Unit ALLOCATED    Transfusion Status OK TO TRANSFUSE    Crossmatch Result Compatible    Unit Number [id number]    Blood Component Type RED CELLS,LR    Unit division 00    Status of Unit ALLOCATED    Transfusion Status OK TO TRANSFUSE    Crossmatch Result Compatible   MRSA PCR Screening     Status: None   Collection Time: 07/10/18  8:06 PM  Result Value Ref Range   MRSA by PCR NEGATIVE NEGATIVE    Comment:        The GeneXpert MRSA Assay (FDA approved for NASAL specimens only), is one component of a comprehensive MRSA colonization surveillance program. It is not intended to diagnose MRSA infection nor to guide or monitor treatment for MRSA infections. Performed at [facility], [place] [address] [place], [place] [address]   Glucose, capillary     Status: Abnormal   Collection Time: 07/10/18  8:39 PM  Result Value Ref Range   Glucose-Capillary 120 (H) 70 - 99 mg/dL  Blood gas, arterial     Status: Abnormal   Collection Time: 07/10/18  8:45 PM  Result Value Ref Range   FIO2 0.40    Delivery systems VENTURI MASK    pH, Arterial 7.383 7.350 - 7.450   pCO2 arterial 27.5 (L) 32.0 - 48.0 mmHg   pO2, Arterial 56.5 (L) 83.0 - 108.0 mmHg    Bicarbonate 16.0 (L) 20.0 - 28.0 mmol/L   Acid-base deficit 8.1 (  H) 0.0 - 2.0 mmol/L   O2 Saturation 88.9 %   Patient temperature 98.6    Drawn by [id number]    Sample type ARTERIAL DRAW    Allens test (pass/fail) PASS PASS  Hemoglobin and hematocrit, blood     Status: None   Collection Time: 07/10/18 11:22 PM  Result Value Ref Range   Hemoglobin 14.9 12.0 - 15.0 g/dL    Comment: REPEATED TO VERIFY POST TRANSFUSION SPECIMEN    HCT 44.9 36.0 - 46.0 %    Comment: Performed at [facility] [address], [place], [place] [address]  Glucose, capillary     Status: Abnormal   Collection Time: 07/10/18 11:58 PM  Result Value Ref Range   Glucose-Capillary 142 (H) 70 - 99 mg/dL  Cortisol     Status: None   Collection Time: 07/11/18  1:20 AM  Result Value Ref Range   Cortisol, Plasma 48.3 ug/dL    Comment: (NOTE) AM    6.7 - 22.6 ug/dL PM   <10.0       ug/dL Performed at [facility] [address] [place], [place] [address]   Procalcitonin - Baseline     Status: None   Collection Time: 07/11/18  1:20 AM  Result Value Ref Range   Procalcitonin 28.47 ng/mL    Comment:        Interpretation: PCT >= 10 ng/mL: Important systemic inflammatory response, almost exclusively due to severe bacterial sepsis or septic shock. (NOTE)       Sepsis PCT Algorithm           Lower Respiratory Tract                                      Infection PCT Algorithm    ----------------------------     ----------------------------         PCT < 0.25 ng/mL                PCT < 0.10 ng/mL         Strongly encourage             Strongly discourage   discontinuation of antibiotics    initiation of antibiotics    ----------------------------     -----------------------------       PCT 0.25 - 0.50 ng/mL            PCT 0.10 - 0.25 ng/mL               OR       >80% decrease in PCT            Discourage initiation of                                            antibiotics      Encourage  discontinuation           of antibiotics    ----------------------------     -----------------------------         PCT >= 0.50 ng/mL              PCT 0.26 - 0.50 ng/mL                AND       <80% decrease in PCT  Encourage initiation of                                             antibiotics       Encourage continuation           of antibiotics    ----------------------------     -----------------------------        PCT >= 0.50 ng/mL                  PCT > 0.50 ng/mL               AND         increase in PCT                  Strongly encourage                                      initiation of antibiotics    Strongly encourage escalation           of antibiotics                                     -----------------------------                                           PCT <= 0.25 ng/mL                                                 OR                                        > 80% decrease in PCT                                     Discontinue / Do not initiate                                             antibiotics Performed at [facility], [address], [place], [place] [address]   CBC     Status: Abnormal   Collection Time: 07/11/18  1:20 AM  Result Value Ref Range   WBC 3.5 (L) 4.0 - 10.5 K/uL   RBC 4.85 3.87 - 5.11 MIL/uL   Hemoglobin 15.7 (H) 12.0 - 15.0 g/dL   HCT 46.9 (H) 36.0 - 46.0 %   MCV 96.7 78.0 - 100.0 fL   MCH 32.4 26.0 - 34.0 pg   MCHC 33.5 30.0 - 36.0 g/dL   RDW 19.9 (H) 11.5 - 15.5 %   Platelets 155 150 - 400 K/uL    Comment: Performed at [facility], [place],  [place] [address]  Protime-INR     Status: Abnormal   Collection Time: 07/11/18  1:21 AM  Result Value Ref Range   Prothrombin Time 18.0 (H) 11.4 - 15.2 seconds   INR 1.50     Comment: Performed at [facility] [address], [place], [place] [address]  Lactic acid, plasma     Status: Abnormal   Collection Time: 07/11/18  3:00 AM  Result Value  Ref Range   Lactic Acid, Venous 3.3 (HH) 0.5 - 1.9 mmol/L    Comment: CRITICAL RESULT CALLED TO, READ BACK BY AND VERIFIED WITH: DIAZ,A RN 07/11/2018 0353 JORDANS Performed at [facility], [place] [address], [place], [place] [address]   Urinalysis, Routine w reflex microscopic     Status: Abnormal   Collection Time: 07/11/18  3:02 AM  Result Value Ref Range   Color, Urine AMBER (A) YELLOW    Comment: BIOCHEMICALS MAY BE AFFECTED BY COLOR   APPearance HAZY (A) CLEAR   Specific Gravity, Urine 1.029 1.005 - 1.030   pH 5.0 5.0 - 8.0   Glucose, UA NEGATIVE NEGATIVE mg/dL   Hgb urine dipstick NEGATIVE NEGATIVE   Bilirubin Urine NEGATIVE NEGATIVE   Ketones, ur NEGATIVE NEGATIVE mg/dL   Protein, ur NEGATIVE NEGATIVE mg/dL   Nitrite NEGATIVE NEGATIVE   Leukocytes, UA NEGATIVE NEGATIVE    Comment: Performed at [facility] [address] [place], [place] [address]  Glucose, capillary     Status: Abnormal   Collection Time: 07/11/18  6:07 AM  Result Value Ref Range   Glucose-Capillary 115 (H) 70 - 99 mg/dL  Comprehensive metabolic panel     Status: Abnormal   Collection Time: 07/11/18  7:52 AM  Result Value Ref Range   Sodium 137 135 - 145 mmol/L   Potassium 5.1 3.5 - 5.1 mmol/L   Chloride 107 98 - 111 mmol/L   CO2 19 (L) 22 - 32 mmol/L   Glucose, Bld 297 (H) 70 - 99 mg/dL   BUN 25 (H) 8 - 23 mg/dL   Creatinine, Ser 1.16 (H) 0.44 - 1.00 mg/dL   Calcium 7.5 (L) 8.9 - 10.3 mg/dL   Total Protein 3.6 (L) 6.5 - 8.1 g/dL   Albumin 2.2 (L) 3.5 - 5.0 g/dL   AST 21 15 - 41 U/L   ALT 15 0 - 44 U/L   Alkaline Phosphatase 20 (L) 38 - 126 U/L   Total Bilirubin 2.5 (H) 0.3 - 1.2 mg/dL   GFR calc non Af Amer 44 (L) >60 mL/min   GFR calc Af Amer 51 (L) >60 mL/min    Comment: (NOTE) The eGFR has been calculated using the CKD EPI equation. This calculation has not been validated in all clinical situations. eGFR's persistently <60 mL/min signify possible Chronic Kidney Disease.     Anion gap 11 5 - 15    Comment: Performed at [facility] [address], [place], [place] [address]  Prealbumin     Status: Abnormal   Collection Time: 07/11/18  7:52 AM  Result Value Ref Range   Prealbumin 6.0 (L) 18 - 38 mg/dL    Comment: Performed at [facility] [address] [place], [place] [address]  Magnesium     Status: None   Collection Time: 07/11/18  7:52 AM  Result Value Ref Range   Magnesium 2.0 1.7 - 2.4 mg/dL    Comment: Performed at [facility]  [address], [place], [place] [address]  Phosphorus     Status: None   Collection Time: 07/11/18  7:52 AM  Result Value Ref Range   Phosphorus 4.6 2.5 - 4.6 mg/dL    Comment: Performed at [facility], [place] [address] [place], [place] [address]  CBC     Status: Abnormal   Collection Time: 07/11/18  7:52 AM  Result Value Ref Range   WBC 7.7 4.0 - 10.5 K/uL   RBC 4.25 3.87 - 5.11 MIL/uL   Hemoglobin 13.9 12.0 - 15.0 g/dL   HCT 40.3 36.0 - 46.0 %   MCV 94.8 78.0 - 100.0 fL   MCH 32.7 26.0 - 34.0 pg   MCHC 34.5 30.0 - 36.0 g/dL   RDW 19.7 (H) 11.5 - 15.5 %   Platelets 148 (L) 150 - 400 K/uL    Comment: Performed at [facility], [place] [address] [place], [place] [address]  Differential     Status: Abnormal   Collection Time: 07/11/18  7:52 AM  Result Value Ref Range   Neutrophils Relative % 88 %   Lymphocytes Relative 4 %   Monocytes Relative 7 %   Eosinophils Relative 0 %   Basophils Relative 1 %   Neutro Abs 6.8 1.7 - 7.7 K/uL   Lymphs Abs 0.3 (L) 0.7 - 4.0 K/uL   Monocytes Absolute 0.5 0.1 - 1.0 K/uL   Eosinophils Absolute 0.0 0.0 - 0.7 K/uL   Basophils Absolute 0.1 0.0 - 0.1 K/uL   RBC Morphology POLYCHROMASIA PRESENT     Comment: BURR CELLS   WBC Morphology INCREASED BANDS (>20% BANDS)     Comment: MILD LEFT SHIFT (1-5% METAS, OCC MYELO, OCC BANDS) TOXIC GRANULATION VACUOLATED NEUTROPHILS DOHLE BODIES Performed at [facility] [address], [place],   [address]   Procalcitonin     Status: None   Collection Time: 07/11/18  7:52 AM  Result Value Ref Range   Procalcitonin 72.83 ng/mL    Comment:        Interpretation: PCT >= 10 ng/mL: Important systemic inflammatory response, almost exclusively due to severe bacterial sepsis or septic shock. (NOTE)       Sepsis PCT Algorithm           Lower Respiratory Tract                                      Infection PCT Algorithm    ----------------------------     ----------------------------         PCT < 0.25 ng/mL                PCT < 0.10 ng/mL         Strongly encourage             Strongly discourage   discontinuation of antibiotics    initiation of antibiotics    ----------------------------     -----------------------------       PCT 0.25 - 0.50 ng/mL            PCT 0.10 - 0.25 ng/mL               OR       >80% decrease in PCT            Discourage initiation of  antibiotics      Encourage discontinuation           of antibiotics    ----------------------------     -----------------------------         PCT >= 0.50 ng/mL              PCT 0.26 - 0.50 ng/mL                AND       <80% decrease in PCT             Encourage initiation of                                             antibiotics       Encourage continuation           of antibiotics    ----------------------------     -----------------------------        PCT >= 0.50 ng/mL                  PCT > 0.50 ng/mL               AND         increase in PCT                  Strongly encourage                                      initiation of antibiotics    Strongly encourage escalation           of antibiotics                                     -----------------------------                                           PCT <= 0.25 ng/mL                                                 OR                                        > 80% decrease in PCT                                      Discontinue / Do not initiate                                             antibiotics Performed at [facility], [place] [address] [place], [place] [address]   Lactic acid, plasma     Status: Abnormal   Collection Time:  07/11/18  7:52 AM  Result Value Ref Range   Lactic Acid, Venous 3.1 (HH) 0.5 - 1.9 mmol/L    Comment: CRITICAL RESULT CALLED TO, READ BACK BY AND VERIFIED WITH: K.JENKINS,RN 0915 07/11/18 CLARK,S Performed at [facility], [place] [address], [place], [place] [address]   Triglycerides     Status: None   Collection Time: 07/11/18  7:52 AM  Result Value Ref Range   Triglycerides 102 <150 mg/dL    Comment: Performed at [facility] [address] [place], [place] [address]  Provider-confirm verbal Blood Bank order - RBC, Type & Screen; 2 Units; Order taken: 07/10/2018; 6:38 PM; Level 1 Trauma, Emergency Release, STAT 2 units of O negative red cells and 2 units of A plasmas emergency released to the ER @ 1843. All units...     Status: None   Collection Time: 07/11/18  8:00 AM  Result Value Ref Range   Blood product order confirm      MD AUTHORIZATION REQUESTED Performed at [facility] [address], [place], [place] [address]     Studies/Results: Dg Abd 1 View  Result Date: 07/10/2018 CLINICAL DATA:  82 year old female with chronic gastric outlet obstruction due to peptic ulcer disease related severe pyloric stenosis. Postoperative day 6 from placement of gastrostomy and jejunostomy tubes. EXAM: ABDOMEN - 1 VIEW COMPARISON:  07/09/2018 and earlier, including the preoperative CT 07/01/2018. FINDINGS: Continued contrast mixed with food debris in what appears to be a massively dilated stomach. A left upper quadrant gastrostomy tube is visible. A jejunostomy tube projects over the lower portion of the distal stomach. There is some retained oral contrast in nondilated transverse colon and hepatic flexure. Gas-filled nondilated bowel loops elsewhere. No  pneumoperitoneum is evident. Stable cholecystectomy clips. Stable visible lung bases. Osteopenia. No acute osseous abnormality identified. IMPRESSION: 1. Continued massively dilated stomach with gastric contrast mixed with food debris. Dilated gastric antrum simulates the appearance of dilated right colon. See CT Abdomen and Pelvis 07/01/2018. 2. Nondilated hepatic flexure and transverse colon containing oral contrast. Gas-filled nondilated bowel loops elsewhere may reflect ileus. Electronically Signed   By: Genevie Ann M.D.   On: 07/10/2018 08:59   Dg Chest Port 1 View  Result Date: 07/10/2018 CLINICAL DATA:  Shock EXAM: PORTABLE CHEST 1 VIEW COMPARISON:  06/17/2018 FINDINGS: Patient has a LEFT-sided PICC line with tip to LOWER superior vena cava-RIGHT atrium. The heart is enlarged. There are patchy perihilar airspace filling opacities. More confluent opacity at the LEFT lung base is consistent with consolidation or atelectasis. Partially imaged contrast within bowel loops in the UPPER abdomen. IMPRESSION: 1. Perihilar pulmonary edema. 2. LEFT LOWER lobe opacity consistent with atelectasis or consolidation, new since the previous exams. Question of aspiration pneumonia. 3. LEFT-sided PICC line tip to the LOWER superior vena cava/UPPER RIGHT atrium. Electronically Signed   By: Nolon Nations M.D.   On: 07/10/2018 20:37   Dg Abd Portable 1v-small Bowel Obstruction Protocol-initial, 8 Hr Delay  Result Date: 07/10/2018 CLINICAL DATA:  Small-bowel obstruction EXAM: PORTABLE ABDOMEN - 1 VIEW COMPARISON:  07/10/2018 at 18:16 hours FINDINGS: Some interval decompression of the stomach with retained oral contrast seen within. Moderate gaseous distention of small bowel loops noted in the left lower quadrant persist though less prominent since prior with contrast filled stable moderate dilatation of small bowel loops the mid pelvis and right lower quadrant. A large amount of stool is seen within the cecum through  transverse colon. No significant  antegrade IMPRESSION: Interval decompression of the stomach and gas-filled left lower quadrant small bowel loops since prior. No significant antegrade progression of ingested oral contrast. Significant stool retention within the colon from cecum through splenic flexure. Electronically Signed   By: Ashley Royalty M.D.   On: 07/10/2018 23:47   Dg Abd Portable 1v  Result Date: 07/10/2018 CLINICAL DATA:  Abdominal pain.  Bleeding from PEG. EXAM: PORTABLE ABDOMEN - 1 VIEW COMPARISON:  07/10/2018 FINDINGS: Surgical clips overlie the midline of the abdomen. Surgical drains are identified in the LEFT mid abdomen. Persistent marked dilatation of the stomach. There is increased dilatation of contrast containing small bowel loops in the RIGHT hemipelvis. Numerous dilated gas-filled loops are also identified in the LEFT abdomen and pelvis. Nondilated transverse colon contains contrast. There is opacity at the LEFT lung base, obscuring the hemidiaphragm. No free intraperitoneal air. IMPRESSION: 1. Continued massive dilatation of the stomach. 2. Increased dilatation of small bowel loops. 3. No free intraperitoneal air. Electronically Signed   By: Nolon Nations M.D.   On: 07/10/2018 18:38   [place] Ekg Site Rite  Result Date: 07/10/2018 If Site Rite image not attached, placement could not be confirmed due to current cardiac rhythm.   Medications: I have reviewed the patient's current medications.  Assessment: Decompression of the stomach post-lavage with Toomey syringe 3600 mL of drainage from gastric tube in the last 24 hours  Significant stool retention and colon from cecum to splenic flexure Moderate gaseous distention of small bowel in the left lower quadrant, mid pelvis and right lower quadrant  Postoperative ileus following open gastrojejunostomy for severe pyloric steatosis  Hypotension,elevated pro-calcitonin 72.83, lactic acidosis 3.3/3.1,metabolic acidosis bicarbonate of 19  with elevated blood sugar of 297  Aspiration pneumonia,perihilar pulmonary edema, left lower lobe opacity consistent with atelectasis versus consolidation  Malnutrition-started on IV TPN  Renal impairment-BUN 25/creatinine 1.16/GFR 44  Plan: Remains nothing by mouth Patient about to receive one mineral oil enema as ordered by surgical team.  On IV pressors (IV phenylephrine and IV norepinephrine), remains hypotensive On IV Unasyn  Guarded prognosis   [name] 07/11/2018, 10:44 AM   Pager [phone] If no answer or after 5 PM call [phone]

## 2018-07-11 NOTE — Progress Notes (Addendum)
PHARMACY - ADULT TOTAL PARENTERAL NUTRITION CONSULT NOTE   Pharmacy Consult:  TPN Indication:  Prolonged ileus  Patient Measurements: Height: 5\' 1"  (154.9 cm) Weight: 142 lb 3.2 oz (64.5 kg) IBW/kg (Calculated) : 47.8 TPN AdjBW (KG): 48.5 Body mass index is 26.87 kg/m. Usual Weight: 64 kg  Assessment:  [name] presented on 06/26/2018 with nausea and right arm tingling and numbness.  Patient has a history of severe pyloric stenosis and channel difficulties resulting in retained food.  She underwent gastrojejunostomy with insertion of gastrostomy (for draining) and jejunostomy (for feeding) tubes on 06/19/2018.  TF started on 07/05/18 and patient tolerated goal rate.  On 07/07/18, imaging concerning for ileus.  TF held on 07/08/18 and repeat imaging revealed significant amount of enteric content, likely tube feeds.  She had multiple episodes of emesis on 07/09/18 and Pharmacy consulted to initiate TPN while work-up is in progress to rule out obstruction.  Per documentation, patient has moderate malnutrition.  She has poor PO intake for 2 months PTA and was at risk for refeeding when TF started.  9/5 pm: hypotension and Gtube site bleeding - tx to ICU   GI: hx GOO/GERD/PUD.  AXR shows stomach massively dilated. Gtube on sunction O/P 3650mL, LBM 9/5.  Bisacodyl PR, docusate, Miralax, PRN Zofran (liquid meds preferred) Endo: no hx DM - CBGs controlled (on prednisone for RA); HC 50 mg q6h for stress prophylaxis so expect CBGs to rise Insulin requirements in the past 24 hours: 1 unit Lytes: Low CO2, CoCa 8.8, K and Mag at goal (9/5 GI progress note stated to keep K > 4.5 and Mag > 2), Phos up to 4.6 Renal: SCr up to 1.16, BUN up 25 - UOP 0.3 ml/kg/hr. Net -0.1L. D51/2NS 20K at 75/hr Pulm: 9L [place] Cards: CAD/HTN/HLD - hypotensive - phenylephrine + norepi, amiodarone AC: Eliquis PTA for AFib >> Heparin, now on hold - Gtube site bleeding 9/5, given 2 units PRBC, Hgb 13.9, plts down 148 Hepatobil: LFTs WNL, tbili  elevated at 2.5, lipase WNL Neuro: chronic pain - Voltaren gel, folate, Ativan; APAP IV, morphine prn ID: Concern for aspiration and started on Unasyn, PCT 72.83, WBC wnl, afebrile TPN Access: Double lumen PICC placed 9/5 TPN start date: 07/10/18  Nutritional Goals (per RD rec on 9/5): 1600-1800 kCal, 80-90gm protein and >/= 1.6L fluid per day  Current Nutrition:  NPO   Plan:  Adjust TPN for re-estimated needs  Increase TPN to 60 ml/hr (goal rate ~75 ml/hr) TPN will provide 67g AA, 226g CHO and 36g ILE for a total of 1399 kCal, meeting ~87% of patient's needs Electrolytes in TPN: increase Na/Ca/Mag, K/Phos neutral as TPN rate increased today, Cl:Ac 1:2 Daily multivitamin and trace elements in TPN D/C folate and add 1mg  to daily TPN D/c famotidine and add 40 mg to TPN Start sensitive SSI Q6H.  D/C if CBGs remain controlled at goal TPN rate. Standard TPN labs and nursing care orders, f/u am labs   Renold Genta, PharmD, BCPS Clinical Pharmacist Clinical phone for 07/11/2018 until 3p is [phone] Please check AMION for all Pharmacist numbers by unit 07/11/2018 7:18 AM

## 2018-07-12 ENCOUNTER — Inpatient Hospital Stay (HOSPITAL_COMMUNITY): Payer: Medicare Other

## 2018-07-12 DIAGNOSIS — R34 Anuria and oliguria: Secondary | ICD-10-CM

## 2018-07-12 DIAGNOSIS — Z7189 Other specified counseling: Secondary | ICD-10-CM

## 2018-07-12 LAB — CBC
HEMATOCRIT: 41.6 % (ref 36.0–46.0)
HEMOGLOBIN: 14.1 g/dL (ref 12.0–15.0)
MCH: 32.3 pg (ref 26.0–34.0)
MCHC: 33.9 g/dL (ref 30.0–36.0)
MCV: 95.2 fL (ref 78.0–100.0)
Platelets: 114 10*3/uL — ABNORMAL LOW (ref 150–400)
RBC: 4.37 MIL/uL (ref 3.87–5.11)
RDW: 18.7 % — ABNORMAL HIGH (ref 11.5–15.5)
WBC: 27.1 10*3/uL — ABNORMAL HIGH (ref 4.0–10.5)

## 2018-07-12 LAB — COMPREHENSIVE METABOLIC PANEL
ALBUMIN: 1.7 g/dL — AB (ref 3.5–5.0)
ALK PHOS: 32 U/L — AB (ref 38–126)
ALT: 18 U/L (ref 0–44)
ANION GAP: 8 (ref 5–15)
AST: 27 U/L (ref 15–41)
BILIRUBIN TOTAL: 1 mg/dL (ref 0.3–1.2)
BUN: 35 mg/dL — AB (ref 8–23)
CALCIUM: 7.3 mg/dL — AB (ref 8.9–10.3)
CO2: 20 mmol/L — ABNORMAL LOW (ref 22–32)
Chloride: 104 mmol/L (ref 98–111)
Creatinine, Ser: 1.34 mg/dL — ABNORMAL HIGH (ref 0.44–1.00)
GFR calc Af Amer: 42 mL/min — ABNORMAL LOW (ref >60)
GFR, EST NON AFRICAN AMERICAN: 37 mL/min — AB (ref >60)
GLUCOSE: 243 mg/dL — AB (ref 70–99)
Potassium: 4.4 mmol/L (ref 3.5–5.1)
Sodium: 132 mmol/L — ABNORMAL LOW (ref 135–145)
TOTAL PROTEIN: 3.5 g/dL — AB (ref 6.5–8.1)

## 2018-07-12 LAB — PROCALCITONIN: PROCALCITONIN: 74.38 ng/mL

## 2018-07-12 LAB — GLUCOSE, CAPILLARY
GLUCOSE-CAPILLARY: 221 mg/dL — AB (ref 70–99)
GLUCOSE-CAPILLARY: 244 mg/dL — AB (ref 70–99)
GLUCOSE-CAPILLARY: 249 mg/dL — AB (ref 70–99)
Glucose-Capillary: 244 mg/dL — ABNORMAL HIGH (ref 70–99)
Glucose-Capillary: 227 mg/dL — ABNORMAL HIGH (ref 70–99)

## 2018-07-12 LAB — HEMOGLOBIN AND HEMATOCRIT, BLOOD
HCT: 42.4 % (ref 36.0–46.0)
Hemoglobin: 14.1 g/dL (ref 12.0–15.0)

## 2018-07-12 LAB — PHOSPHORUS: Phosphorus: 3.1 mg/dL (ref 2.5–4.6)

## 2018-07-12 LAB — MAGNESIUM: Magnesium: 1.8 mg/dL (ref 1.7–2.4)

## 2018-07-12 MED ORDER — TRAVASOL 10 % IV SOLN
INTRAVENOUS | Status: AC
  Administered 2018-07-12: 18:00:00 via INTRAVENOUS
  Filled 2018-07-12: qty 676.8

## 2018-07-12 MED ORDER — INSULIN GLARGINE 100 UNIT/ML ~~LOC~~ SOLN
10.0000 [IU] | Freq: Every day | SUBCUTANEOUS | Status: DC
  Administered 2018-07-12 – 2018-07-13 (×2): 10 [IU] via SUBCUTANEOUS
  Filled 2018-07-12 (×2): qty 0.1

## 2018-07-12 MED ORDER — VASOPRESSIN 20 UNIT/ML IV SOLN
0.0300 [IU]/min | INTRAVENOUS | Status: DC
  Administered 2018-07-12: 0.03 [IU]/min via INTRAVENOUS
  Filled 2018-07-12 (×2): qty 2

## 2018-07-12 MED ORDER — TRAVASOL 10 % IV SOLN
INTRAVENOUS | Status: DC
  Filled 2018-07-12: qty 676.8

## 2018-07-12 NOTE — Progress Notes (Signed)
Pt's FSBS from picc line at 2335 >600, pt have TPN and levo with dextrose infusing through picc line. 2340 FSBS 249 on finger stick. FSBS at 2348 , 244 from picc line after thoroughly  flushing central line. Will continue to monitor.

## 2018-07-12 NOTE — Progress Notes (Signed)
Angela [name]  BWI:[id number] DOB: 11/14/1935 DOA: 06/22/2018 PCP: [name], MD    LOS: 12 days   Reason for Consult / Chief Complaint:  Hypotension  Consulting MD and date of consult:  Dr. Sarajane Jews 07/10/2018  HPI/summary of hospital stay:  82 year old female with PMH significant for PUD with resultant severe pyloric stenosis with prior dilation attempts by EGD (4 since March, last EGD 8/13 which did not show malignancy) with ongoing weight loss (>25lbs over several months), and malnutrition with prior recommendations for a Jtube, PAF, HLD, HTN, CAD, and RA admitted 8/25 to [facility] for worsening nausea, increasing weakness, constipation, hyponatremia and dehydration. She was treated with ceftriaxone for UTI. She remained severely constipated despite medical management and CT abd showed severe gastric distention due to severe pyloric obstruction.  Surgery and GI was consulted.  She was decompressed with an NGT. Additional attempts at dilation felt likely not to help given her chronic gastric outlet obstruction.  Cardiology consulted for pre-op clearance and on 8/30, she underwent a gastrojejunostomy with placement of gastrostomy and jejunostomy tube.  She has required transfusions for ABLA, last transfusion 8/31, and foley catheter for urinary retention.  She was tolerated feeding via Jtube but developed postoperative ileus and therefore tubefeedings held on 9/4. TPN started 9/5.  Patient having worsening abdominal distention and pain with increased output from Gtube.  Developed hypotension and acute bleeding from around Gtube in the evening of 9/5 requiring transfer to ICU for further stabilization.  PCCM consulted to resume medical management while in ICU.   Subjective  Feels better this AM, continues to require high dose pressors however, asking for ice  Assessment & Plan:   Hypotension- likely multifactorial in setting of adrenal insufficiency, hypovolemic shock related to high output  from Gtube, with concern for possible hemorraghic shock from bleeding around Gtube - 1.2L of bilious output manually withdrawn by surgery and separate ~1850 ml output  P:  Continue ICU monitoring Monitor CBC for signs of bleeding Maintain on levophed instead of neo Add vasopressin for shock since high dose levo Follow CVP but minimize fluid intake now, tremendous 3rd spacing Continue stress dose steroids, note that lab was drawn after steroids given May need to match G-tube output with IVF replacement but currently is positive 4 liters so will not  R/o ABLA  - leaking from Gtube site with movement/ turning - s/p purse string sitch by surgery to help with bleeding from around Gtube; bleeding mostly noted from site with movement/ rolling,? Concern for internal bleeding P:  Trend Hg Consider repeat CT ABD, defer to CCS Transfuse per ICU protocol  AKI Cr rising and UOP is deteriorating P: Will need to discuss with family in the next 24 to 48 hours if CRRT is a consideration, from yesterday's conversation it seems that comfort is more desirable but would like to give another 24 hours of observation of the renal function prior to that.  Postoperative adynamic ileus s/p open gastrojejunostomy for gastric obstruction 2/2 severe pyloric stenosis r/t PUD that has not been responsive to dilation and EGD - apparently had clear/ brown tinged liquid BM with lots of flatulence upon transfer to ICU  P:  Per CCS, GI  G-Tube to suction  Goal K >4, Mg > 2 Escalate abx to include abdominal coverage   Acute Hypoxic Respiratory Failure  - concern for aspiration - acute onset with hypotension on floor with sats in the 80's on NRB, currently now on VM -  likely secondary to pain and abd distended, sats improved after manual decompression by surgery with 1.2L out Gtube P:  O2 to support sats > 90% Pulmonary hygiene - IS, mobilize  Empiric aspiration coverage, CXR with mild LLL airspace disease  (?effusion vs aspiration). ABX escalated as above for abdominal coverage as well.    Compensated NAG Metabolic Acidosis - ? Saline contribution, RTA? P: Monitor  Hold further bicarb pushes  PAF  P:  Hold apixaban (last dose 8/28) Monitor tele  Continue amiodarone Hold propranolol  Protein Calorie Malnutrition P:  TPN per pharmacy  Urinary Retention - s/p catheter placement P:  Monitor UOP  Foley  Rheumatoid Arthritis  P:  Hold home prednisone while on stress dose steroids  Hold methotrexate  Goals of Care  P: ? Long term recovery, critically ill with multiple baseline medical problems LCB with pressors only now after a discussion with patient and family, next we need to consider if dialysis is a consideration if UOP remains this poor.  Best Practice / Goals of Care / Disposition.   DVT prophylaxis: SCDs GI prophylaxis: pepcid  Diet: NPO/ TPN Mobility: bedrest Code Status: Full  Family Communication: patient & son-in-law updated on plan of care 9/6  Disposition / Summary of Today's Plan 07/12/18   ICU monitoring, continue stress dose steroids.  Empiric aspiration coverage but CXR does not show significant infiltrate (but she is immune suppressed).  Consider repeat CT abd imaging if further concern for bleeding. Monitor Hgb (has been stable)   Consultants: date of consult/date signed off (if applicable)/final recs  Cards- for preop clearance- signed off  Surgery and GI following   Procedures: 8/30 open gastrojejunostomy with gastrostomy and jejunostomy tube placement  Significant Diagnostic Tests: 8/27 CT abd/ pelvis >> Massively distended stomach with food and fluid present. This most likely is due to recurrence of severe pyloric stenosis by history. Circumferential thickening of the a no rectal mucosa of questionable significance. Correlate clinically. Degenerative disc disease at L3-4 and L4-5 as noted above. Moderate-sized bilateral pleural effusions with  bibasilar atelectasis. Cardiomegaly.  9/5 AXR >> Continued massive dilatation of the stomach. Increased dilatation of small bowel loops. No free intraperitoneal air.  Micro Data: 8/25 UC  >> multiple species 9/5 BCx 2 >> 9/5 MRSA PCR >>  Antimicrobials:  8/25 Ceftriaxone >> 8/27 8/30 Cefotetan x 1 preop Unasyn 9/5 >> 9/6 Vanco 9/6 >> Cefepime 9/6 >>  Flagyl 9/6 >>   Objective    Examination: General: Chronically ill appearing female, NAD HEENT: [place]/AT, PERRL, EOM-I and MMM Neuro: AAOx4, speech clear, MAE  CV: RRR, Nl S1/S2 and -M/R/G PULM: Diffuse crackles GI: protuberant, midline surgical incision with staples, GJ drain with brown fluid draining  Extremities: warm/dry, 1-2+ generalized edema  Skin: no rashes or lesions  Blood pressure (!) 102/54, pulse (!) 37, temperature 97.6 F (36.4 C), temperature source Oral, resp. rate (!) 26, height 5\' 1"  (1.549 m), weight 69 kg, SpO2 94 %. CVP:  [0 mmHg-14 mmHg] 0 mmHg     Intake/Output Summary (Last 24 hours) at 07/12/2018 0851 Last data filed at 07/12/2018 0800 Gross per 24 hour  Intake 4177.41 ml  Output 150 ml  Net 4027.41 ml   Filed Weights   07/10/18 0541 07/11/18 0500 07/12/18 0414  Weight: 64.4 kg 64.5 kg 69 kg     Labs    CBC: Recent Labs  Lab 07/08/18 0823 07/10/18 1827  07/11/18 0120 07/11/18 3300 07/11/18 1411 07/11/18 2019 07/12/18 0359 07/12/18 7622  WBC 8.2 3.8*  --  3.5* 7.7  --   --  27.1*  --   NEUTROABS  --   --   --   --  6.8  --   --   --   --   HGB 10.1* 10.9*   < > 15.7* 13.9 13.7 13.5 14.1 14.1  HCT 30.5* 33.5*   < > 46.9* 40.3 41.8 40.7 41.6 42.4  MCV 102.3* 102.4*  --  96.7 94.8  --   --  95.2  --   PLT 197 210  --  155 148*  --   --  114*  --    < > = values in this interval not displayed.   Basic Metabolic Panel: Recent Labs  Lab 07/07/18 0402 07/08/18 0823 07/10/18 0406 07/11/18 0752 07/12/18 0359 07/12/18 0720  NA 137 134* 132* 137 132*  --   K 4.4 4.3 4.4 5.1 4.4  --     CL 109 106 105 107 104  --   CO2 24 23 19* 19* 20*  --   GLUCOSE 120* 110* 122* 297* 243*  --   BUN 23 13 14  25* 35*  --   CREATININE 0.71 0.54 0.46 1.16* 1.34*  --   CALCIUM 7.9* 7.8* 7.9* 7.5* 7.3*  --   MG  --   --  1.8 2.0  --  1.8  PHOS  --   --  3.3 4.6  --  3.1   GFR: Estimated Creatinine Clearance: 30.3 mL/min (A) (by C-G formula based on SCr of 1.34 mg/dL (H)). Recent Labs  Lab 07/10/18 1827 07/11/18 0120 07/11/18 0300 07/11/18 0752 07/12/18 0359  PROCALCITON  --  28.47  --  72.83 74.38  WBC 3.8* 3.5*  --  7.7 27.1*  LATICACIDVEN  --   --  3.3* 3.1*  --    Liver Function Tests: Recent Labs  Lab 07/11/18 0752 07/12/18 0359  AST 21 27  ALT 15 18  ALKPHOS 20* 32*  BILITOT 2.5* 1.0  PROT 3.6* 3.5*  ALBUMIN 2.2* 1.7*   No results for input(s): LIPASE, AMYLASE in the last 168 hours. No results for input(s): AMMONIA in the last 168 hours.   ABG    Component Value Date/Time   PHART 7.383 07/10/2018 2045   PCO2ART 27.5 (L) 07/10/2018 2045   PO2ART 56.5 (L) 07/10/2018 2045   HCO3 16.0 (L) 07/10/2018 2045   TCO2 25 06/17/2013 1128   ACIDBASEDEF 8.1 (H) 07/10/2018 2045   O2SAT 88.9 07/10/2018 2045    Coagulation Profile: Recent Labs  Lab 07/11/18 0121  INR 1.50   Cardiac Enzymes: Recent Labs  Lab 07/10/18 1849  TROPONINI <0.03   HbA1C: Hgb A1c MFr Bld  Date/Time Value Ref Range Status  08/06/2009 10:53 PM  4.6 - 6.1 % Final   5.2 (NOTE) The ADA recommends the following therapeutic goal for glycemic control related to Hgb A1c measurement: Goal of therapy: <6.5 Hgb A1c  Reference: American Diabetes Association: Clinical Practice Recommendations 2010, Diabetes Care, 2010, 33: (Suppl  1).   CBG: Recent Labs  Lab 07/11/18 1751 07/11/18 2335 07/11/18 2340 07/11/18 2348 07/12/18 0533  GLUCAP 196* >600* 249* 244* 244*   The patient is critically ill with multiple organ systems failure and requires high complexity decision making for assessment and  support, frequent evaluation and titration of therapies, application of advanced monitoring technologies and extensive interpretation of multiple databases.   Critical Care Time devoted to patient care services described  in this note is  33  Minutes. This time reflects time of care of this signee Dr Jennet Maduro. This critical care time does not reflect procedure time, or teaching time or supervisory time of PA/NP/Med student/Med Resident etc but could involve care discussion time.  Rush Farmer, M.D. [facility] Pulmonary/Critical Care Medicine. Pager: [phone]. After hours pager: [phone].

## 2018-07-12 NOTE — Progress Notes (Addendum)
PHARMACY - ADULT TOTAL PARENTERAL NUTRITION CONSULT NOTE   Pharmacy Consult:  TPN Indication:  Prolonged ileus  Patient Measurements: Height: 5\' 1"  (154.9 cm) Weight: 152 lb 1.9 oz (69 kg) IBW/kg (Calculated) : 47.8 TPN AdjBW (KG): 48.5 Body mass index is 28.74 kg/m. Usual Weight: 64 kg  Assessment:  [name] presented on 06/20/2018 with nausea and right arm tingling and numbness.  Patient has a history of severe pyloric stenosis and channel difficulties resulting in retained food.  She underwent gastrojejunostomy with insertion of gastrostomy (for draining) and jejunostomy (for feeding) tubes on 06/20/2018.  TF started on 07/05/18 and patient tolerated goal rate.  On 07/07/18, imaging concerning for ileus.  TF held on 07/08/18 and repeat imaging revealed significant amount of enteric content, likely tube feeds.  She had multiple episodes of emesis on 07/09/18 and Pharmacy consulted to initiate TPN while work-up is in progress to rule out obstruction.  Per documentation, patient has moderate malnutrition.  She has poor PO intake for 2 months PTA and was at risk for refeeding when TF started.  Patient transferred to the ICU on 07/10/18 PM due to hypotension and Gtube site bleeding.  GI: hx GOO/GERD/PUD.  AXR shows stomach massively dilated. Baseline prealbumin low at 6, Gtube on sunction - no O/P charted, LBM 9/5.  Bisacodyl PR, docusate, Senokot, Miralax, PRN Zofran (liquid meds preferred) Endo: no hx DM - CBGs elevated with IV steroid (prednisone PTA for RA >> HC, cortisol 48) Insulin requirements in the past 24 hours: 7 units Lytes: low Na/CO2/Mag *9/5 GI progress note stated to keep K > 4.5 and Mag > 2 Renal: AKI  - SCr 0.46 > 1.34, BUN up 35 - UOP 0.1 ml/kg/hr, off IVF Pulm: down to 4L [place] Cards: CAD/HTN/HLD - MAP 70s, brady, CVP 4 - weaning Levophed, amiodarone AC: Eliquis PTA for AFib >> Heparin, now on hold - Gtube site bleeding 9/5, s/p 2 units PRBC, H/H WNL, plts down to 114 Hepatobil: LFTs WNL,  tbili normalzed, lipase / TG WNL Neuro: chronic pain, pain score 0 - Voltaren gel, folate in TPN, PRN morphine ID: Unasyn > Vanc/Cefepime/Flagyl for sepsis/aspiration - afebrile, WBC up 27.1, PCT 73, BCx pending TPN Access: double lumen PICC placed 07/10/18 TPN start date: 07/10/18  Nutritional Goals (per RD rec on 9/5): 1600-1800 kCal, 80-90gm protein and >/= 1.6L fluid per day  Current Nutrition:  TPN    Plan:  Continue TPN with lipid given poor PO intake for 2 months PTA Continue TPN at 60 ml/hr (goal rate 75 ml/hr) due to elevated CBGs TPN provides 67g AA, 226g CHO and 36g ILE for a total of 1399 kCal, meeting ~87% of patient's needs Electrolytes in TPN: increase Na/Mag/K, max acetate Daily multivitamin and trace elements in TPN D/C folate and add 1mg  to daily TPN D/c famotidine and add 20 mg to TPN (reduce dose d/t rising SCr) Continue sensitive SSI Q6H + start Lantus 10 units SQ daily per discussion with PCCM F/U CBGs and advance TPN to goal once controlled, clinical status and the need to adjust needs   Gal Feldhaus D. Mina Marble, PharmD, BCPS, [place]  07/12/2018, 9:19 AM

## 2018-07-12 NOTE — Progress Notes (Signed)
Case reviewed and patient seen and examined. Case discussed with intensivist Dr. Lavina Hamman. At this point I do not see much more that we can do from a GI standpoint. We will sign off. Call us if needed.

## 2018-07-12 NOTE — Progress Notes (Signed)
[facility] Progress Note  8 Days Post-Op  Subjective: CC:  Feels like her breathing is a little shallow.    Objective: Vital signs in last 24 hours: Temp:  [96.6 F (35.9 C)-97.6 F (36.4 C)] 97.6 F (36.4 C) (09/07 0803) Pulse Rate:  [29-105] 37 (09/07 0830) Resp:  [22-34] 26 (09/07 0830) BP: (42-125)/(21-96) 102/54 (09/07 0830) SpO2:  [90 %-100 %] 94 % (09/07 0830) Weight:  [02 kg] 69 kg (09/07 0414) Last BM Date: 07/10/18  Intake/Output from previous day: 09/06 0701 - 09/07 0700 In: 4231.3 [I.V.:3034.4; IV Piggyback:1196.8] Out: 150 [Urine:150] Intake/Output this shift: Total I/O In: 80.6 [I.V.:80.6] Out: -   PE:  Gen:  Alert, NAD, cooperative Card:  Regular rate and rhythm, 2+ pitting edema BLE Pulm:  Breathing comfortably  Abd: Soft, Non tender.  Incision c/d/i.  G-tube: 3,600cc/24h, brown  Skin: warm and dry, no rashes  Psych: A&Ox3   Lab Results:  Recent Labs    07/11/18 0752  07/12/18 0359 07/12/18 0811  WBC 7.7  --  27.1*  --   HGB 13.9   < > 14.1 14.1  HCT 40.3   < > 41.6 42.4  PLT 148*  --  114*  --    < > = values in this interval not displayed.   BMET Recent Labs    07/11/18 0752 07/12/18 0359  NA 137 132*  K 5.1 4.4  CL 107 104  CO2 19* 20*  GLUCOSE 297* 243*  BUN 25* 35*  CREATININE 1.16* 1.34*  CALCIUM 7.5* 7.3*   PT/INR Recent Labs    07/11/18 0121  LABPROT 18.0*  INR 1.50   CMP     Component Value Date/Time   NA 132 (L) 07/12/2018 0359   NA 135 06/09/2018 1324   K 4.4 07/12/2018 0359   CL 104 07/12/2018 0359   CO2 20 (L) 07/12/2018 0359   GLUCOSE 243 (H) 07/12/2018 0359   BUN 35 (H) 07/12/2018 0359   BUN 13 06/09/2018 1324   CREATININE 1.34 (H) 07/12/2018 0359   CREATININE 0.60 05/25/2016 1252   CALCIUM 7.3 (L) 07/12/2018 0359   PROT 3.5 (L) 07/12/2018 0359   ALBUMIN 1.7 (L) 07/12/2018 0359   AST 27 07/12/2018 0359   ALT 18 07/12/2018 0359   ALKPHOS 32 (L) 07/12/2018 0359   BILITOT 1.0  07/12/2018 0359   GFRNONAA 37 (L) 07/12/2018 0359   GFRAA 42 (L) 07/12/2018 0359   Lipase     Component Value Date/Time   LIPASE 34 07/02/2018 1148       Studies/Results: Dg Chest Port 1 View  Result Date: 07/12/2018 CLINICAL DATA:  Acute respiratory failure.  Hypoxia. EXAM: PORTABLE CHEST 1 VIEW COMPARISON:  July 10, 2018 FINDINGS: Haziness over the lower left chest and more diffusely on the right likely represents layering effusions. Cardiomegaly. The right PICC line terminates in the right side of the heart, 3 cm above the caval atrial junction. No pneumothorax. Bibasilar opacities underlie the effusions. Probable pulmonary edema. IMPRESSION: 1. Cardiomegaly and probable pulmonary edema. 2. Bilateral pleural effusions, right greater than left with underlying opacities. 3. The left PICC line terminates within the right atrium, approximately 3 cm below the caval atrial junction. Consider repositioning. Electronically Signed   By: Dorise Bullion III M.D   On: 07/12/2018 07:34   Dg Chest Port 1 View  Result Date: 07/10/2018 CLINICAL DATA:  Shock EXAM: PORTABLE CHEST 1 VIEW COMPARISON:  06/13/2018 FINDINGS: Patient has a LEFT-sided PICC line  with tip to LOWER superior vena cava-RIGHT atrium. The heart is enlarged. There are patchy perihilar airspace filling opacities. More confluent opacity at the LEFT lung base is consistent with consolidation or atelectasis. Partially imaged contrast within bowel loops in the UPPER abdomen. IMPRESSION: 1. Perihilar pulmonary edema. 2. LEFT LOWER lobe opacity consistent with atelectasis or consolidation, new since the previous exams. Question of aspiration pneumonia. 3. LEFT-sided PICC line tip to the LOWER superior vena cava/UPPER RIGHT atrium. Electronically Signed   By: Nolon Nations M.D.   On: 07/10/2018 20:37   Dg Abd Portable 1v-small Bowel Obstruction Protocol-initial, 8 Hr Delay  Result Date: 07/10/2018 CLINICAL DATA:  Small-bowel obstruction  EXAM: PORTABLE ABDOMEN - 1 VIEW COMPARISON:  07/10/2018 at 18:16 hours FINDINGS: Some interval decompression of the stomach with retained oral contrast seen within. Moderate gaseous distention of small bowel loops noted in the left lower quadrant persist though less prominent since prior with contrast filled stable moderate dilatation of small bowel loops the mid pelvis and right lower quadrant. A large amount of stool is seen within the cecum through transverse colon. No significant antegrade IMPRESSION: Interval decompression of the stomach and gas-filled left lower quadrant small bowel loops since prior. No significant antegrade progression of ingested oral contrast. Significant stool retention within the colon from cecum through splenic flexure. Electronically Signed   By: Ashley Royalty M.D.   On: 07/10/2018 23:47   Dg Abd Portable 1v  Result Date: 07/10/2018 CLINICAL DATA:  Abdominal pain.  Bleeding from PEG. EXAM: PORTABLE ABDOMEN - 1 VIEW COMPARISON:  07/10/2018 FINDINGS: Surgical clips overlie the midline of the abdomen. Surgical drains are identified in the LEFT mid abdomen. Persistent marked dilatation of the stomach. There is increased dilatation of contrast containing small bowel loops in the RIGHT hemipelvis. Numerous dilated gas-filled loops are also identified in the LEFT abdomen and pelvis. Nondilated transverse colon contains contrast. There is opacity at the LEFT lung base, obscuring the hemidiaphragm. No free intraperitoneal air. IMPRESSION: 1. Continued massive dilatation of the stomach. 2. Increased dilatation of small bowel loops. 3. No free intraperitoneal air. Electronically Signed   By: Nolon Nations M.D.   On: 07/10/2018 18:38    Anti-infectives: Anti-infectives (From admission, onward)   Start     Dose/Rate Route Frequency Ordered Stop   07/12/18 1315  vancomycin (VANCOCIN) IVPB 1000 mg/200 mL premix     1,000 mg 200 mL/hr over 60 Minutes Intravenous Every 24 hours 07/11/18  1310     07/11/18 1315  vancomycin (VANCOCIN) 1,250 mg in sodium chloride 0.9 % 250 mL IVPB     1,250 mg 166.7 mL/hr over 90 Minutes Intravenous  Once 07/11/18 1310 07/11/18 1512   07/11/18 1315  ceFEPIme (MAXIPIME) 2 g in sodium chloride 0.9 % 100 mL IVPB     2 g 200 mL/hr over 30 Minutes Intravenous Every 24 hours 07/11/18 1310     07/11/18 1245  metroNIDAZOLE (FLAGYL) IVPB 500 mg     500 mg 100 mL/hr over 60 Minutes Intravenous Every 8 hours 07/11/18 1231     07/11/18 1213  Ampicillin-Sulbactam (UNASYN) 3 g in sodium chloride 0.9 % 100 mL IVPB  Status:  Discontinued     3 g 200 mL/hr over 30 Minutes Intravenous Every 6 hours 07/11/18 1138 07/11/18 1231   07/10/18 2300  Ampicillin-Sulbactam (UNASYN) 3 g in sodium chloride 0.9 % 100 mL IVPB  Status:  Discontinued     3 g 200 mL/hr over 30 Minutes Intravenous  Every 8 hours 07/10/18 2255 07/11/18 1138   07/05/2018 0915  cefoTEtan (CEFOTAN) 1 g in sodium chloride 0.9 % 100 mL IVPB     1 g 200 mL/hr over 30 Minutes Intravenous To Surgery 06/14/2018 0830 07/05/2018 1132   06/30/18 0645  cefTRIAXone (ROCEPHIN) 1 g in sodium chloride 0.9 % 100 mL IVPB  Status:  Discontinued     1 g 200 mL/hr over 30 Minutes Intravenous Every 24 hours 06/30/18 0643 07/02/18 1655   06/28/2018 1500  cefTRIAXone (ROCEPHIN) 1 g in sodium chloride 0.9 % 100 mL IVPB  Status:  Discontinued     1 g 200 mL/hr over 30 Minutes Intravenous Every 24 hours 07/03/2018 1451 07/05/2018 1528       Assessment/Plan CAD s/p stent in 2008, not on ASA due to frequent ulcers SVT HTN Atrial fibrillation PUD  Gastroparesis and GOO Rheumatoid arthritis/Spondylolisthesis/Chronic pain syndrome Osteoporosis UTI - on IV rocephin  Constipation    Gastric outlet obstruction secondary to severe pyloric stenosis - EGD with bx 8/13 did not show malignancy, pyloric stenosis most likely due to PUD - past balloon dilations 01/10/18, 01/24/18, 04/15/18, 06/17/18 - all by Dr.  Therisa Doyne  POD#8S/POpengastrojejunostomy, with gastrostomy andfeedingjejunostomy tubes - 06/09/2018 - Dr. Georgette Dover - Post-operative ileus along with poor gastric emptying 2/2 chronic GOO, and constipation - AXR with significant amount of retained food in the patients distended stomach - stomach irrigated and suctioned evening of 9/5, G-tube reinforced with a pursestring suture by BT. - SB protocol per J-tube 9/5 shows interval decompression of the stomach but significant constipation - recommend trying a second enema.   -Continuegastrostomy to LIWS -Continue to hold J-tube feeds and continue dulcolax  And miralax per J-tube;flush every 4 hours and before/after medication use. Liquid meds per tube preferred, meds can be finely crushed and administered if necessary. Hope to start tube feeds soon.    Hypotension - suspect 2/2 significant GI losses; s/p fluid bolus and 2 u PRBC 9/5; remains hypotensive and on 2 pressors; per CCM Suspect aspiration PNA - LLL consolidation on CXR 9/5, per CCM  FEN:IVF, Hold TF, TNA  VTE: SCD's,heparin okay from a surgical standpoint ID:pre-op only Foley:in place for urinary retention, per medicine Follow up:Dr. Georgette Dover  PLAN:appreciate CCM management of hypovolemic shock/possible aspiration, continue to hold tube feeds, continue TNA, miralax per J tube and enema.  Tube feeds once GI function starts.      LOS: 12 days

## 2018-07-13 DIAGNOSIS — E440 Moderate protein-calorie malnutrition: Secondary | ICD-10-CM

## 2018-07-13 DIAGNOSIS — I429 Cardiomyopathy, unspecified: Secondary | ICD-10-CM

## 2018-07-13 LAB — GLUCOSE, CAPILLARY
GLUCOSE-CAPILLARY: 137 mg/dL — AB (ref 70–99)
Glucose-Capillary: 168 mg/dL — ABNORMAL HIGH (ref 70–99)
Glucose-Capillary: 201 mg/dL — ABNORMAL HIGH (ref 70–99)
Glucose-Capillary: 209 mg/dL — ABNORMAL HIGH (ref 70–99)

## 2018-07-13 LAB — POCT I-STAT 3, ART BLOOD GAS (G3+)
Acid-base deficit: 3 mmol/L — ABNORMAL HIGH (ref 0.0–2.0)
Bicarbonate: 23.6 mmol/L (ref 20.0–28.0)
O2 Saturation: 95 %
PH ART: 7.297 — AB (ref 7.350–7.450)
TCO2: 25 mmol/L (ref 22–32)
pCO2 arterial: 48.1 mmHg — ABNORMAL HIGH (ref 32.0–48.0)
pO2, Arterial: 85 mmHg (ref 83.0–108.0)

## 2018-07-13 LAB — TYPE AND SCREEN
ABO/RH(D): O POS
Antibody Screen: NEGATIVE
UNIT DIVISION: 0
Unit division: 0
Unit division: 0
Unit division: 0

## 2018-07-13 LAB — BASIC METABOLIC PANEL
Anion gap: 6 (ref 5–15)
BUN: 42 mg/dL — AB (ref 8–23)
CALCIUM: 7.8 mg/dL — AB (ref 8.9–10.3)
CO2: 23 mmol/L (ref 22–32)
CREATININE: 0.94 mg/dL (ref 0.44–1.00)
Chloride: 104 mmol/L (ref 98–111)
GFR calc Af Amer: >60 mL/min (ref >60)
GFR calc non Af Amer: 56 mL/min — ABNORMAL LOW (ref >60)
GLUCOSE: 222 mg/dL — AB (ref 70–99)
Potassium: 4.4 mmol/L (ref 3.5–5.1)
SODIUM: 133 mmol/L — AB (ref 135–145)

## 2018-07-13 LAB — CBC
HCT: 38.9 % (ref 36.0–46.0)
Hemoglobin: 13.1 g/dL (ref 12.0–15.0)
MCH: 32.5 pg (ref 26.0–34.0)
MCHC: 33.7 g/dL (ref 30.0–36.0)
MCV: 96.5 fL (ref 78.0–100.0)
PLATELETS: 60 10*3/uL — AB (ref 150–400)
RBC: 4.03 MIL/uL (ref 3.87–5.11)
RDW: 17.7 % — AB (ref 11.5–15.5)
WBC: 18.3 10*3/uL — ABNORMAL HIGH (ref 4.0–10.5)

## 2018-07-13 LAB — BPAM RBC
Blood Product Expiration Date: 201909142359
Blood Product Expiration Date: 201909142359
Blood Product Expiration Date: 201910042359
Blood Product Expiration Date: 201910042359
ISSUE DATE / TIME: 201909051841
ISSUE DATE / TIME: 201909051841
UNIT TYPE AND RH: 5100
UNIT TYPE AND RH: 5100
UNIT TYPE AND RH: 9500
Unit Type and Rh: 9500

## 2018-07-13 LAB — MAGNESIUM: Magnesium: 2 mg/dL (ref 1.7–2.4)

## 2018-07-13 LAB — PHOSPHORUS: Phosphorus: 2.8 mg/dL (ref 2.5–4.6)

## 2018-07-13 MED ORDER — LORAZEPAM 2 MG/ML IJ SOLN
0.5000 mg | INTRAMUSCULAR | Status: DC | PRN
  Administered 2018-07-13: 0.5 mg via INTRAVENOUS
  Filled 2018-07-13: qty 1

## 2018-07-13 MED ORDER — INSULIN GLARGINE 100 UNIT/ML ~~LOC~~ SOLN
20.0000 [IU] | Freq: Every day | SUBCUTANEOUS | Status: DC
  Administered 2018-07-14: 20 [IU] via SUBCUTANEOUS
  Filled 2018-07-13: qty 0.2

## 2018-07-13 MED ORDER — SIMETHICONE 40 MG/0.6ML PO SUSP
80.0000 mg | Freq: Four times a day (QID) | ORAL | Status: DC | PRN
  Filled 2018-07-13: qty 1.2

## 2018-07-13 MED ORDER — SIMETHICONE 40 MG/0.6ML PO SUSP
80.0000 mg | Freq: Four times a day (QID) | ORAL | Status: DC | PRN
  Administered 2018-07-13: 80 mg via JEJUNOSTOMY
  Filled 2018-07-13 (×2): qty 1.2

## 2018-07-13 MED ORDER — MORPHINE SULFATE (PF) 2 MG/ML IV SOLN
2.0000 mg | INTRAVENOUS | Status: DC | PRN
  Administered 2018-07-13 – 2018-07-14 (×2): 2 mg via INTRAVENOUS
  Filled 2018-07-13 (×2): qty 1

## 2018-07-13 MED ORDER — HEPARIN SODIUM (PORCINE) 5000 UNIT/ML IJ SOLN
5000.0000 [IU] | Freq: Three times a day (TID) | INTRAMUSCULAR | Status: DC
  Administered 2018-07-13 – 2018-07-14 (×3): 5000 [IU] via SUBCUTANEOUS
  Filled 2018-07-13 (×3): qty 1

## 2018-07-13 MED ORDER — TRAVASOL 10 % IV SOLN
INTRAVENOUS | Status: DC
  Administered 2018-07-13: 18:00:00 via INTRAVENOUS
  Filled 2018-07-13: qty 846

## 2018-07-13 MED ORDER — INSULIN GLARGINE 100 UNIT/ML ~~LOC~~ SOLN
10.0000 [IU] | SUBCUTANEOUS | Status: AC
  Administered 2018-07-13: 10 [IU] via SUBCUTANEOUS
  Filled 2018-07-13: qty 0.1

## 2018-07-13 MED ORDER — ALBUMIN HUMAN 25 % IV SOLN
25.0000 g | Freq: Four times a day (QID) | INTRAVENOUS | Status: AC
  Administered 2018-07-13 – 2018-07-14 (×4): 25 g via INTRAVENOUS
  Filled 2018-07-13: qty 100
  Filled 2018-07-13 (×3): qty 50
  Filled 2018-07-13: qty 100
  Filled 2018-07-13: qty 50

## 2018-07-13 NOTE — Progress Notes (Signed)
Dr. Oletta Darter notified of agonal respirations, restarting of levo, and decreased mental status. Orders received, will continue to monitor.

## 2018-07-13 NOTE — Progress Notes (Signed)
[place] Progress Note Patient Name: Angela [name] DOB: 1936-04-24 MRN: [id number]   Date of Service  07/13/2018  HPI/Events of Note  ABG on Angela [name] O2 = 7.29/48.1/85.0. Suspect that hypercarbia related to Morphine. Morphine may also be contributing to adynamic ileus.   eICU Interventions  Will order: 1. Mylicon 80 mg per J tube Q 6 hours PRN abdominal distention or flatulance.  2. Be very careful with Morphine medication.      Intervention Category Major Interventions: Acid-Base disturbance - evaluation and management  Sommer,Steven Eugene 07/13/2018, 10:10 PM

## 2018-07-13 NOTE — Progress Notes (Signed)
PHARMACY - ADULT TOTAL PARENTERAL NUTRITION CONSULT NOTE   Pharmacy Consult:  TPN Indication:  Prolonged ileus  Patient Measurements: Height: 5\' 1"  (154.9 cm) Weight: 152 lb 1.9 oz (69 kg) IBW/kg (Calculated) : 47.8 TPN AdjBW (KG): 48.5 Body mass index is 28.74 kg/m. Usual Weight: 64 kg  Assessment:  [name] presented on 06/08/2018 with nausea and right arm tingling and numbness.  Patient has a history of severe pyloric stenosis and channel difficulties resulting in retained food.  She underwent gastrojejunostomy with insertion of gastrostomy (for draining) and jejunostomy (for feeding) tubes on 06/07/2018.  TF started on 07/05/18 and patient tolerated goal rate.  On 07/07/18, imaging concerning for ileus.  TF held on 07/08/18 and repeat imaging revealed significant amount of enteric content, likely tube feeds.  She had multiple episodes of emesis on 07/09/18 and Pharmacy consulted to initiate TPN while work-up is in progress to rule out obstruction.  Per documentation, patient has moderate malnutrition.  She has poor PO intake for 2 months PTA and was at risk for refeeding when TF started.  Patient transferred to the ICU on 07/10/18 PM due to hypotension and Gtube site bleeding.  GI: hx GOO/GERD/PUD.  AXR shows stomach massively dilated. Baseline prealbumin low at 6, Gtube on sunction - no O/P charted, LBM 9/5.  Bisacodyl PR, docusate, Senokot, Miralax, PRN Zofran (liquid meds preferred) Endo: no hx DM - CBGs elevated with IV steroid, improving with basal insulin (prednisone PTA for RA >> HC, cortisol 48) - no change to steroid dose today per PCCM Insulin requirements in the past 24 hours: 9 units + Lantus 10/d *sensitive or moderate SSI likely sufficient once off steroid Lytes: low Na, K acceptable, Phos trending down *9/5 GI progress note stated to keep K > 4.5 and Mag > 2 Renal: AKI resolving - SCr down 0.94, BUN up 42 - UOP 0.1 ml/kg/hr, off IVF Pulm: remains on 4L [place] Cards: CAD/HTN/HLD. MAP  60-90s, HR 90s, CVP 4 - weaning Levophed, vasopressin added 9/7, amiodarone AC: Eliquis PTA for AFib >> Heparin, now on hold - Gtube site bleeding 9/5, s/p 2 units PRBC, H/H WNL, plts down to 114 Hepatobil: LFTs WNL, tbili normalzed, lipase / TG WNL Neuro: chronic pain, pain score 0 - Voltaren gel, folate in TPN, PRN morphine ID: Unasyn > Vanc/Cefepime/Flagyl for sepsis/aspiration - afebrile, WBC down to 18.3, PCT 73, BCx pending TPN Access: double lumen PICC placed 07/10/18  TPN start date: 07/10/18  Nutritional Goals (per RD rec on 9/5): 1600-1800 kCal, 80-90gm protein and >/= 1.6L fluid per day  Current Nutrition:  TPN    Plan:  Increase TPN to goal rate of 75 ml/hr TPN will provide 85g AA, 283g CHO and 45g ILE for a total of 1749 kCal, meeting 100% of patient's needs Electrolytes in TPN: increase Na, max acetate - increasing lytes with rate increase Daily multivitamin and trace elements in TPN D/C folate and add 1mg  to daily TPN D/C famotidine and add to TPN, reduce to 20mg  on 9/7 d/t rising SCr Continue sensitive SSI Q6H + increase Lantus to 20 units SQ daily while on steroid  F/U AM labs, CBGs, clinical status and the need to adjust needs   Angela [name], PharmD, BCPS, [place] 07/13/2018, 10:42 AM

## 2018-07-13 NOTE — Progress Notes (Signed)
Angela [name]  XBM:[id number] DOB: 1936/01/19 DOA: 06/13/2018 PCP: [name], MD    LOS: 13 days   Reason for Consult / Chief Complaint:  Hypotension  Consulting MD and date of consult:  Dr. Sarajane Jews 07/10/2018  HPI/summary of hospital stay:  82 year old female with PMH significant for PUD with resultant severe pyloric stenosis with prior dilation attempts by EGD (4 since March, last EGD 8/13 which did not show malignancy) with ongoing weight loss (>25lbs over several months), and malnutrition with prior recommendations for a Jtube, PAF, HLD, HTN, CAD, and RA admitted 8/25 to [facility] for worsening nausea, increasing weakness, constipation, hyponatremia and dehydration. She was treated with ceftriaxone for UTI. She remained severely constipated despite medical management and CT abd showed severe gastric distention due to severe pyloric obstruction.  Surgery and GI was consulted.  She was decompressed with an NGT. Additional attempts at dilation felt likely not to help given her chronic gastric outlet obstruction.  Cardiology consulted for pre-op clearance and on 8/30, she underwent a gastrojejunostomy with placement of gastrostomy and jejunostomy tube.  She has required transfusions for ABLA, last transfusion 8/31, and foley catheter for urinary retention.  She was tolerated feeding via Jtube but developed postoperative ileus and therefore tubefeedings held on 9/4. TPN started 9/5.  Patient having worsening abdominal distention and pain with increased output from Gtube.  Developed hypotension and acute bleeding from around Gtube in the evening of 9/5 requiring transfer to ICU for further stabilization.  PCCM consulted to resume medical management while in ICU.   Subjective  No events overnight, decreasing levophed to 3 mcg  Assessment & Plan:   Hypotension- likely multifactorial in setting of adrenal insufficiency, hypovolemic shock related to high output from Gtube, with concern for possible  hemorraghic shock from bleeding around Gtube - 1.2L of bilious output manually withdrawn by surgery and separate ~1850 ml output  P:  - Continue ICU monitoring - Monitor CBC for signs of bleeding - Maintain on levophed instead of neo - D/C vasopressor - Titrate levophed as able, down to 3 mcg - Follow CVP but minimize fluid intake now, tremendous 3rd spacing - Continue stress dose steroids, note that lab was drawn after steroids given - Additional albumin today and monitor I/O strictly  R/o ABLA  - leaking from Gtube site with movement/ turning - s/p purse string sitch by surgery to help with bleeding from around Gtube; bleeding mostly noted from site with movement/ rolling,? Concern for internal bleeding P:  - Trend Hg - CT of the abdomin noted - GI signed off and surgery following - Transfuse per ICU protocol  AKI Cr rising and UOP is deteriorating P: - Will need to discuss with family in the next 24 to 48 hours if CRRT is a consideration, from yesterday's conversation it seems that comfort is more desirable but would like to give another 24 hours of observation of the renal function prior to that.  Postoperative adynamic ileus s/p open gastrojejunostomy for gastric obstruction 2/2 severe pyloric stenosis r/t PUD that has not been responsive to dilation and EGD - apparently had clear/ brown tinged liquid BM with lots of flatulence upon transfer to ICU  P:  - Per CCS, GI  - G-Tube to suction  - Goal K >4, Mg > 2 - Escalate abx to include abdominal coverage   Acute Hypoxic Respiratory Failure  - Concern for aspiration - Acute onset with hypotension on floor with sats in the 80's  on NRB, currently now on VM - Likely secondary to pain and abd distended, sats improved after manual decompression by surgery with 1.2L out Gtube P:  - O2 to support sats > 90% - Pulmonary hygiene - IS, mobilize  - Empiric aspiration coverage, CXR with mild LLL airspace disease (?effusion vs  aspiration). ABX escalated as above for abdominal coverage as well.    Compensated NAG Metabolic Acidosis - ? Saline contribution, RTA? P: - Monitor  - No further bicarb pushes at this point  PAF  P:  - Hold apixaban (last dose 8/28) - Start SQ heparin - Monitor tele  - Continue amiodarone - Hold propranolol  Protein Calorie Malnutrition P:  - TPN per pharmacy  Urinary Retention - s/p catheter placement P:  - Monitor UOP  - Foley  Rheumatoid Arthritis  P:  - Hold home prednisone while on stress dose steroids  - Hold methotrexate  Goals of Care  P: - ? Long term recovery, critically ill with multiple baseline medical problems - LCB with pressors only now after a discussion with patient and family, next we need to consider if dialysis is a consideration if UOP remains this poor.  Best Practice / Goals of Care / Disposition.   DVT prophylaxis: SCDs GI prophylaxis: pepcid  Diet: NPO/ TPN Mobility: bedrest Code Status: Full  Family Communication: patient & son-in-law updated on plan of care 9/6  Disposition / Summary of Today's Plan 07/13/18   ICU monitoring, continue stress dose steroids.  Empiric aspiration coverage but CXR does not show significant infiltrate (but she is immune suppressed).  Consider repeat CT abd imaging if further concern for bleeding. Monitor Hgb (has been stable)  Consultants: date of consult/date signed off (if applicable)/final recs  Cards- for preop clearance- signed off  Surgery and GI following   Procedures: 8/30 open gastrojejunostomy with gastrostomy and jejunostomy tube placement  Significant Diagnostic Tests: 8/27 CT abd/ pelvis >> Massively distended stomach with food and fluid present. This most likely is due to recurrence of severe pyloric stenosis by history. Circumferential thickening of the a no rectal mucosa of questionable significance. Correlate clinically. Degenerative disc disease at L3-4 and L4-5 as noted above.  Moderate-sized bilateral pleural effusions with bibasilar atelectasis. Cardiomegaly.  9/5 AXR >> Continued massive dilatation of the stomach. Increased dilatation of small bowel loops. No free intraperitoneal air.  Micro Data: 8/25 UC  >> multiple species 9/5 BCx 2 >> 9/5 MRSA PCR >>  Antimicrobials:  8/25 Ceftriaxone >> 8/27 8/30 Cefotetan x 1 preop Unasyn 9/5 >> 9/6 Vanco 9/6 >> Cefepime 9/6 >>  Flagyl 9/6 >>   Objective    Examination: General: Chronically ill appearing female, NAD HEENT: [place]/AT, PERRL, EOM-I and MMM Neuro: AAOx4, speech clear, MAE  CV: RRR, Nl S1/S2 and -M/R/G PULM: Diffuse crackles GI: protuberant, midline surgical incision with staples, GJ drain with brown fluid draining  Extremities: warm/dry, 1-2+ generalized edema  Skin: no rashes or lesions  Blood pressure 107/85, pulse 99, temperature 97.6 F (36.4 C), temperature source Oral, resp. rate (!) 21, height 5\' 1"  (1.549 m), weight 69 kg, SpO2 96 %. CVP:  [0 mmHg-3 mmHg] 3 mmHg     Intake/Output Summary (Last 24 hours) at 07/13/2018 1110 Last data filed at 07/13/2018 0800 Gross per 24 hour  Intake 2118.42 ml  Output 425 ml  Net 1693.42 ml   Filed Weights   07/10/18 0541 07/11/18 0500 07/12/18 0414  Weight: 64.4 kg 64.5 kg 69 kg  Labs    CBC: Recent Labs  Lab 07/10/18 1827  07/11/18 0120 07/11/18 0752 07/11/18 1411 07/11/18 2019 07/12/18 0359 07/12/18 0811 07/13/18 0606  WBC 3.8*  --  3.5* 7.7  --   --  27.1*  --  18.3*  NEUTROABS  --   --   --  6.8  --   --   --   --   --   HGB 10.9*   < > 15.7* 13.9 13.7 13.5 14.1 14.1 13.1  HCT 33.5*   < > 46.9* 40.3 41.8 40.7 41.6 42.4 38.9  MCV 102.4*  --  96.7 94.8  --   --  95.2  --  96.5  PLT 210  --  155 148*  --   --  114*  --  60*   < > = values in this interval not displayed.   Basic Metabolic Panel: Recent Labs  Lab 07/08/18 0823 07/10/18 0406 07/11/18 0752 07/12/18 0359 07/12/18 0720 07/13/18 0606  NA 134* 132* 137 132*  --   133*  K 4.3 4.4 5.1 4.4  --  4.4  CL 106 105 107 104  --  104  CO2 23 19* 19* 20*  --  23  GLUCOSE 110* 122* 297* 243*  --  222*  BUN 13 14 25* 35*  --  42*  CREATININE 0.54 0.46 1.16* 1.34*  --  0.94  CALCIUM 7.8* 7.9* 7.5* 7.3*  --  7.8*  MG  --  1.8 2.0  --  1.8 2.0  PHOS  --  3.3 4.6  --  3.1 2.8   GFR: Estimated Creatinine Clearance: 43.1 mL/min (by C-G formula based on SCr of 0.94 mg/dL). Recent Labs  Lab 07/11/18 0120 07/11/18 0300 07/11/18 0752 07/12/18 0359 07/13/18 0606  PROCALCITON 28.47  --  72.83 74.38  --   WBC 3.5*  --  7.7 27.1* 18.3*  LATICACIDVEN  --  3.3* 3.1*  --   --    Liver Function Tests: Recent Labs  Lab 07/11/18 0752 07/12/18 0359  AST 21 27  ALT 15 18  ALKPHOS 20* 32*  BILITOT 2.5* 1.0  PROT 3.6* 3.5*  ALBUMIN 2.2* 1.7*   No results for input(s): LIPASE, AMYLASE in the last 168 hours. No results for input(s): AMMONIA in the last 168 hours.   ABG    Component Value Date/Time   PHART 7.383 07/10/2018 2045   PCO2ART 27.5 (L) 07/10/2018 2045   PO2ART 56.5 (L) 07/10/2018 2045   HCO3 16.0 (L) 07/10/2018 2045   TCO2 25 06/17/2013 1128   ACIDBASEDEF 8.1 (H) 07/10/2018 2045   O2SAT 88.9 07/10/2018 2045    Coagulation Profile: Recent Labs  Lab 07/11/18 0121  INR 1.50   Cardiac Enzymes: Recent Labs  Lab 07/10/18 1849  TROPONINI <0.03   HbA1C: Hgb A1c MFr Bld  Date/Time Value Ref Range Status  08/06/2009 10:53 PM  4.6 - 6.1 % Final   5.2 (NOTE) The ADA recommends the following therapeutic goal for glycemic control related to Hgb A1c measurement: Goal of therapy: <6.5 Hgb A1c  Reference: American Diabetes Association: Clinical Practice Recommendations 2010, Diabetes Care, 2010, 33: (Suppl  1).   CBG: Recent Labs  Lab 07/12/18 0533 07/12/18 1208 07/12/18 1754 07/13/18 0004 07/13/18 0700  GLUCAP 244* 227* 221* 209* 201*   The patient is critically ill with multiple organ systems failure and requires high complexity decision  making for assessment and support, frequent evaluation and titration of therapies, application of  advanced monitoring technologies and extensive interpretation of multiple databases.   Critical Care Time devoted to patient care services described in this note is  34  Minutes. This time reflects time of care of this signee Dr Jennet Maduro. This critical care time does not reflect procedure time, or teaching time or supervisory time of PA/NP/Med student/Med Resident etc but could involve care discussion time.  Rush Farmer, M.D. [facility] Pulmonary/Critical Care Medicine. Pager: [phone]. After hours pager: [phone].

## 2018-07-13 NOTE — Progress Notes (Signed)
9 Days Post-Op   Subjective/Chief Complaint: No new complaints   Some SOB no BM    Objective: Vital signs in last 24 hours: Temp:  [96.9 F (36.1 C)-97.6 F (36.4 C)] 97.6 F (36.4 C) (09/08 0748) Pulse Rate:  [37-106] 82 (09/07 1830) Resp:  [19-30] 22 (09/07 1830) BP: (85-134)/(54-86) 90/66 (09/07 1830) SpO2:  [91 %-97 %] 96 % (09/07 1830) Last BM Date: 07/10/18  Intake/Output from previous day: 09/07 0701 - 09/08 0700 In: 2505.5 [I.V.:1805.5; IV Piggyback:500] Out: 425 [Urine:425] Intake/Output this shift: No intake/output data recorded.  Incision/Wound:CDI soft moderate distention noted sore but no peritonitis tube sites clean   Lab Results:  Recent Labs    07/12/18 0359 07/12/18 0811 07/13/18 0606  WBC 27.1*  --  18.3*  HGB 14.1 14.1 13.1  HCT 41.6 42.4 38.9  PLT 114*  --  60*   BMET Recent Labs    07/12/18 0359 07/13/18 0606  NA 132* 133*  K 4.4 4.4  CL 104 104  CO2 20* 23  GLUCOSE 243* 222*  BUN 35* 42*  CREATININE 1.34* 0.94  CALCIUM 7.3* 7.8*   PT/INR Recent Labs    07/11/18 0121  LABPROT 18.0*  INR 1.50   ABG Recent Labs    07/10/18 2045  PHART 7.383  HCO3 16.0*    Studies/Results: Dg Chest Port 1 View  Result Date: 07/12/2018 CLINICAL DATA:  Acute respiratory failure.  Hypoxia. EXAM: PORTABLE CHEST 1 VIEW COMPARISON:  July 10, 2018 FINDINGS: Haziness over the lower left chest and more diffusely on the right likely represents layering effusions. Cardiomegaly. The right PICC line terminates in the right side of the heart, 3 cm above the caval atrial junction. No pneumothorax. Bibasilar opacities underlie the effusions. Probable pulmonary edema. IMPRESSION: 1. Cardiomegaly and probable pulmonary edema. 2. Bilateral pleural effusions, right greater than left with underlying opacities. 3. The left PICC line terminates within the right atrium, approximately 3 cm below the caval atrial junction. Consider repositioning. Electronically Signed    By: Dorise Bullion III M.D   On: 07/12/2018 07:34    Anti-infectives: Anti-infectives (From admission, onward)   Start     Dose/Rate Route Frequency Ordered Stop   07/12/18 1315  vancomycin (VANCOCIN) IVPB 1000 mg/200 mL premix     1,000 mg 200 mL/hr over 60 Minutes Intravenous Every 24 hours 07/11/18 1310     07/11/18 1315  vancomycin (VANCOCIN) 1,250 mg in sodium chloride 0.9 % 250 mL IVPB     1,250 mg 166.7 mL/hr over 90 Minutes Intravenous  Once 07/11/18 1310 07/11/18 1512   07/11/18 1315  ceFEPIme (MAXIPIME) 2 g in sodium chloride 0.9 % 100 mL IVPB     2 g 200 mL/hr over 30 Minutes Intravenous Every 24 hours 07/11/18 1310     07/11/18 1245  metroNIDAZOLE (FLAGYL) IVPB 500 mg     500 mg 100 mL/hr over 60 Minutes Intravenous Every 8 hours 07/11/18 1231     07/11/18 1213  Ampicillin-Sulbactam (UNASYN) 3 g in sodium chloride 0.9 % 100 mL IVPB  Status:  Discontinued     3 g 200 mL/hr over 30 Minutes Intravenous Every 6 hours 07/11/18 1138 07/11/18 1231   07/10/18 2300  Ampicillin-Sulbactam (UNASYN) 3 g in sodium chloride 0.9 % 100 mL IVPB  Status:  Discontinued     3 g 200 mL/hr over 30 Minutes Intravenous Every 8 hours 07/10/18 2255 07/11/18 1138   06/09/2018 0915  cefoTEtan (CEFOTAN) 1 g in sodium  chloride 0.9 % 100 mL IVPB     1 g 200 mL/hr over 30 Minutes Intravenous To Surgery 06/26/2018 0830 06/21/2018 1132   06/30/18 0645  cefTRIAXone (ROCEPHIN) 1 g in sodium chloride 0.9 % 100 mL IVPB  Status:  Discontinued     1 g 200 mL/hr over 30 Minutes Intravenous Every 24 hours 06/30/18 0643 07/02/18 1655   06/12/2018 1500  cefTRIAXone (ROCEPHIN) 1 g in sodium chloride 0.9 % 100 mL IVPB  Status:  Discontinued     1 g 200 mL/hr over 30 Minutes Intravenous Every 24 hours 06/10/2018 1451 07/01/2018 1528      Assessment/Plan: s/p Procedure(s): GASTROJEJUNOSTOMY (N/A) INSERTION OF GASTROSTOMY TUBE AND JEJUNOSTOMY TUBE (N/A)   CAD s/p stent in 2008, not on ASA due to frequent  ulcers SVT HTN Atrial fibrillation PUD  Gastroparesis and GOO Rheumatoid arthritis/Spondylolisthesis/Chronic pain syndrome Osteoporosis UTI - on IV rocephin  Constipation    Gastric outlet obstruction secondary to severe pyloric stenosis - EGD with bx 8/13 did not show malignancy, pyloric stenosis most likely due to PUD - past balloon dilations 01/10/18, 01/24/18, 04/15/18, 06/17/18 - all by Dr. Therisa Doyne  POD#8S/POpengastrojejunostomy, with gastrostomy andfeedingjejunostomy tubes - 07/02/2018 - Dr. Georgette Dover - Post-operative ileus along with poor gastric emptying 2/2 chronic GOO, and constipation - AXR with significant amount of retained food in the patients distended stomach - stomach irrigated and suctioned evening of 9/5, G-tube reinforced with a pursestring suture by BT. - SB protocol per J-tube 9/5 shows interval decompression of the stomach but significant constipation - recommend trying a second enema.   -Continuegastrostomy to LIWS -Continue to holdJ-tube feedsand continue dulcolax  And miralax per J-tube;flush every 4 hours and before/after medication use. Liquid meds per tube preferred, meds can be finely crushed and administered if necessary. Hope to start tube feeds soon.    Hypotension - suspect 2/2 significant GI losses; s/p fluid bolus and 2 u PRBC 9/5; remains hypotensive and on 2 pressors; per CCM Suspect aspiration PNA - leukocytosis improving  LLL consolidation on CXR 9/5, per CCM  FEN:IVF, Hold TF, TNA VTE: SCD's,heparin okay from a surgical standpoint ID:pre-op only Foley:in placefor urinary retention, per medicine Follow up:Dr. Georgette Dover  PLAN:appreciate CCM management of hypovolemic shock/possible aspiration, continue to hold tube feeds, continue TNA, miralax per J tube and enema.  Tube feeds once GI function starts.        LOS: 13 days    Joyice Faster Armon Orvis 07/13/2018

## 2018-07-13 NOTE — Progress Notes (Signed)
Have spoken with patients brother about current assessment and direction of care. Brother expresses that his sister and he have had discussions that resulted in their understanding that both were ready for her to transition in end of life if treatment and interventions could not sustain her with accompanying quality of life. He desires for [place] to focus on increasing comfort and understands that while not mutually exclusive, more aggressive comfort measures may also lend themselves to less life sustaining. Dr. Nelda Marseille updated. Orders recvd.

## 2018-07-13 NOTE — Progress Notes (Signed)
[facility] Progress Note Patient Name: Angela [name] DOB: 11-07-1935 MRN: [id number]   Date of Service  07/13/2018  HPI/Events of Note  Hypotension and agonal respirations - Now back on Norepinephrine IV infusion. I suspect that she may be severely acidemic. Patient is DNR/DNI.   eICU Interventions  Will order: 1. ABG STAT.     Intervention Category Major Interventions: Other:  Lysle Dingwall 07/13/2018, 9:34 PM

## 2018-07-14 LAB — BASIC METABOLIC PANEL
Anion gap: 8 (ref 5–15)
BUN: 45 mg/dL — ABNORMAL HIGH (ref 8–23)
CALCIUM: 8.4 mg/dL — AB (ref 8.9–10.3)
CO2: 24 mmol/L (ref 22–32)
Chloride: 102 mmol/L (ref 98–111)
Creatinine, Ser: 0.8 mg/dL (ref 0.44–1.00)
Glucose, Bld: 150 mg/dL — ABNORMAL HIGH (ref 70–99)
Potassium: 4.3 mmol/L (ref 3.5–5.1)
Sodium: 134 mmol/L — ABNORMAL LOW (ref 135–145)

## 2018-07-14 LAB — GLUCOSE, CAPILLARY
Glucose-Capillary: 157 mg/dL — ABNORMAL HIGH (ref 70–99)
Glucose-Capillary: 168 mg/dL — ABNORMAL HIGH (ref 70–99)
Glucose-Capillary: >600 mg/dL (ref 70–99)

## 2018-07-14 LAB — PHOSPHORUS: Phosphorus: 2.8 mg/dL (ref 2.5–4.6)

## 2018-07-14 LAB — CBC
HCT: 35.5 % — ABNORMAL LOW (ref 36.0–46.0)
Hemoglobin: 11.7 g/dL — ABNORMAL LOW (ref 12.0–15.0)
MCH: 32.1 pg (ref 26.0–34.0)
MCHC: 33 g/dL (ref 30.0–36.0)
MCV: 97.3 fL (ref 78.0–100.0)
PLATELETS: 40 10*3/uL — AB (ref 150–400)
RBC: 3.65 MIL/uL — AB (ref 3.87–5.11)
RDW: 17.3 % — AB (ref 11.5–15.5)
WBC: 15.3 10*3/uL — ABNORMAL HIGH (ref 4.0–10.5)

## 2018-07-14 LAB — MAGNESIUM: MAGNESIUM: 2.3 mg/dL (ref 1.7–2.4)

## 2018-07-14 MED ORDER — SORBITOL 70 % SOLN
960.0000 mL | TOPICAL_OIL | Freq: Once | ORAL | Status: DC
  Filled 2018-07-14: qty 473

## 2018-07-14 MED ORDER — MORPHINE SULFATE (PF) 2 MG/ML IV SOLN
2.0000 mg | INTRAVENOUS | Status: DC | PRN
  Administered 2018-07-14 – 2018-07-15 (×5): 4 mg via INTRAVENOUS
  Administered 2018-07-15: 2 mg via INTRAVENOUS
  Administered 2018-07-15: 4 mg via INTRAVENOUS
  Filled 2018-07-14 (×3): qty 2
  Filled 2018-07-14: qty 1
  Filled 2018-07-14: qty 2
  Filled 2018-07-14: qty 1
  Filled 2018-07-14: qty 2

## 2018-07-14 MED ORDER — ATROPINE SULFATE 1 % OP SOLN
1.0000 [drp] | Freq: Four times a day (QID) | OPHTHALMIC | Status: DC | PRN
  Filled 2018-07-14: qty 2

## 2018-07-14 MED ORDER — TRAVASOL 10 % IV SOLN
INTRAVENOUS | Status: DC
  Filled 2018-07-14: qty 846

## 2018-07-14 MED ORDER — LORAZEPAM 2 MG/ML IJ SOLN
0.5000 mg | INTRAMUSCULAR | Status: DC | PRN
  Administered 2018-07-14 – 2018-07-15 (×3): 1 mg via INTRAVENOUS
  Filled 2018-07-14 (×3): qty 1

## 2018-07-14 MED ORDER — LORAZEPAM 2 MG/ML IJ SOLN: 0.5000 mg | INTRAMUSCULAR | Status: DC | PRN

## 2018-07-14 NOTE — Progress Notes (Signed)
Palliative Medicine RN Note: Consult order noted; spoke with pt's RN to let her know that there will be a delay in seeing the patient due to high consult volume. She reported that patient was just made comfort care and has appropriate orders in place. We will continue to monitor the chart, and if uncontrolled symptoms occur without [place] catching it, please call our office at [phone].  Marjie Skiff Lakya Schrupp, RN, BSN, [facility] Palliative Medicine Team 07/14/2018 12:08 PM Office [phone]

## 2018-07-14 NOTE — Progress Notes (Signed)
PT Cancellation Note  Patient Details Name: Angela [name] MRN: [id number] DOB: [birth year]   Cancelled Treatment:    Reason Eval/Treat Not Completed: (P) Medical issues which prohibited therapy Pt has had a medical decline over the weekend. RN request therapy be held today. PT will follow back tomorrow.   Miamor Ayler B. Migdalia Dk PT, DPT Acute Rehabilitation Services Pager [phone] Office [phone]  [place] 07/14/2018, 10:06 AM

## 2018-07-14 NOTE — Progress Notes (Signed)
[facility] Progress Note  10 Days Post-Op   Assessment/Plan CAD s/p stent in 2008, not on ASA due to frequent ulcers SVT HTN Atrial fibrillation PUD  Gastroparesis and GOO Rheumatoid arthritis/Spondylolisthesis/Chronic pain syndrome Osteoporosis UTI - on IV rocephin  Constipation    Gastric outlet obstruction secondary to severe pyloric stenosis - EGD with bx 8/13 no malignancy, pyloric stenosis most likely due to PUD - past balloon dilations 01/10/18, 01/24/18, 04/15/18, 06/17/18 - all by Dr. Therisa Doyne  S/POpengastrojejunostomy, with gastrostomy andfeedingjejunostomy tubes - 06/23/2018 - Dr. Georgette Dover - Post-op ileus with poor gastric emptying 2/2 chronic GOO, and constipation - AXR with significant amount of retained food in the patients distended stomach - stomach irrigated and suctioned evening of 9/5, G-tube reinforced with a pursestring suture by BT due to leaking of gastric contents. - SB protocol per J-tube 9/5 shows interval decompression of the stomach but significant constipation - recommend enemas. -Continuegastrostomy to LIWS -Continue to holdJ-tube feeds;flush every 4 hours and before/after medication use. Liquid meds per tube preferred, meds can be finely crushed and administered if necessary.   Hypotension - suspect 2/2 significant GI losses; s/p fluid bolus and 2 u PRBC 9/5; remains hypotensive, on levophed; per CCM Suspect aspiration PNA - leukocytosis improving  LLL consolidation on CXR 9/5, per CCM  FEN:IVF, Hold TF, TNA VTE: SCD's,heparin  IO:[id number], Flagyl & Cefepime 09/06>> Foley:per medicine Follow up:Dr. Tsuei  PLAN:continue to hold tube feeds, continue TNA, enemas. Tube feeds once GI function starts. Continues to leak around G tube which is likely clogged and asked nurse to flush. Concerns for cellulitis of Left abdominal wall but already on empiric abx therapy. Will monitor. WBC improving. Palliative care consult  pending.   LOS: 14 days    Subjective: CC: GOO  Pt is responding with yes and no's to questions. She states she is not in pain but after I left the room the sister in law told nurse pt stated she was hurting. Sister in law asked about comfort care. I discussed that it would be best to discuss that with palliative care. Nurse states that palliative care was going to talk to family today but I did not see a consult ordered. I think pt and family would benefit from goals of care meeting with palliative.   Objective: Vital signs in last 24 hours: Temp:  [96.2 F (35.7 C)-98.3 F (36.8 C)] 96.2 F (35.7 C) (09/09 0746) Pulse Rate:  [86-124] 108 (09/09 0800) Resp:  [14-25] 23 (09/09 0800) BP: (76-141)/(49-86) 95/70 (09/09 0800) SpO2:  [92 %-98 %] 95 % (09/09 0800) Weight:  [71.5 kg-73.4 kg] 73.4 kg (09/09 0500) Last BM Date: 07/10/18  Intake/Output from previous day: 09/08 0701 - 09/09 0700 In: 2747.6 [I.V.:1687.4; IV Piggyback:860.2] Out: 785 [Urine:775; Drains:10] Intake/Output this shift: Total I/O In: 160 [I.V.:160; IV Piggyback:0.1] Out: -   PE: Gen:  Ill appearing, cachectic, responds to questions  Pulm:  agonal breathing,  Abd: Soft, distended, absent BS, G tube site red with what appears to be copious gastric drainage around tube, j tube site appears well, erythema and warmth to left hemiabdomen, pt stated no TTP. midline surgical incision with staples Skin: warm and dry, diffuse anasarca    Anti-infectives: Anti-infectives (From admission, onward)   Start     Dose/Rate Route Frequency Ordered Stop   07/12/18 1315  vancomycin (VANCOCIN) IVPB 1000 mg/200 mL premix     1,000 mg 200 mL/hr over 60 Minutes Intravenous Every 24 hours 07/11/18 1310  07/11/18 1315  vancomycin (VANCOCIN) 1,250 mg in sodium chloride 0.9 % 250 mL IVPB     1,250 mg 166.7 mL/hr over 90 Minutes Intravenous  Once 07/11/18 1310 07/11/18 1512   07/11/18 1315  ceFEPIme (MAXIPIME) 2 g in sodium  chloride 0.9 % 100 mL IVPB     2 g 200 mL/hr over 30 Minutes Intravenous Every 24 hours 07/11/18 1310     07/11/18 1245  metroNIDAZOLE (FLAGYL) IVPB 500 mg     500 mg 100 mL/hr over 60 Minutes Intravenous Every 8 hours 07/11/18 1231     07/11/18 1213  Ampicillin-Sulbactam (UNASYN) 3 g in sodium chloride 0.9 % 100 mL IVPB  Status:  Discontinued     3 g 200 mL/hr over 30 Minutes Intravenous Every 6 hours 07/11/18 1138 07/11/18 1231   07/10/18 2300  Ampicillin-Sulbactam (UNASYN) 3 g in sodium chloride 0.9 % 100 mL IVPB  Status:  Discontinued     3 g 200 mL/hr over 30 Minutes Intravenous Every 8 hours 07/10/18 2255 07/11/18 1138   06/30/2018 0915  cefoTEtan (CEFOTAN) 1 g in sodium chloride 0.9 % 100 mL IVPB     1 g 200 mL/hr over 30 Minutes Intravenous To Surgery 06/20/2018 0830 06/28/2018 1132   06/30/18 0645  cefTRIAXone (ROCEPHIN) 1 g in sodium chloride 0.9 % 100 mL IVPB  Status:  Discontinued     1 g 200 mL/hr over 30 Minutes Intravenous Every 24 hours 06/30/18 0643 07/02/18 1655   06/05/2018 1500  cefTRIAXone (ROCEPHIN) 1 g in sodium chloride 0.9 % 100 mL IVPB  Status:  Discontinued     1 g 200 mL/hr over 30 Minutes Intravenous Every 24 hours 06/18/2018 1451 07/01/2018 1528      Lab Results:  Recent Labs    07/13/18 0606 07/14/18 0415  WBC 18.3* 15.3*  HGB 13.1 11.7*  HCT 38.9 35.5*  PLT 60* 40*   BMET Recent Labs    07/13/18 0606 07/14/18 0415  NA 133* 134*  K 4.4 4.3  CL 104 102  CO2 23 24  GLUCOSE 222* 150*  BUN 42* 45*  CREATININE 0.94 0.80  CALCIUM 7.8* 8.4*   PT/INR No results for input(s): LABPROT, INR in the last 72 hours. CMP     Component Value Date/Time   NA 134 (L) 07/14/2018 0415   NA 135 06/09/2018 1324   K 4.3 07/14/2018 0415   CL 102 07/14/2018 0415   CO2 24 07/14/2018 0415   GLUCOSE 150 (H) 07/14/2018 0415   BUN 45 (H) 07/14/2018 0415   BUN 13 06/09/2018 1324   CREATININE 0.80 07/14/2018 0415   CREATININE 0.60 05/25/2016 1252   CALCIUM 8.4 (L)  07/14/2018 0415   PROT 3.5 (L) 07/12/2018 0359   ALBUMIN 1.7 (L) 07/12/2018 0359   AST 27 07/12/2018 0359   ALT 18 07/12/2018 0359   ALKPHOS 32 (L) 07/12/2018 0359   BILITOT 1.0 07/12/2018 0359   GFRNONAA >60 07/14/2018 0415   GFRAA >60 07/14/2018 0415   Lipase     Component Value Date/Time   LIPASE 34 06/19/2018 1148    Studies/Results: No results found.    Kalman Drape , [facility] 07/14/2018, 9:45 AM  Pager: [phone] Mon-Wed, Friday 7:00am-4:30pm Thurs 7am-11:30am  Consults: [phone]

## 2018-07-14 NOTE — Progress Notes (Signed)
Received pt from Shirlean Mylar, Therapist, sports. Pt on comfort care measures, assessed pt status and provided emotional support to family at bedside.

## 2018-07-14 NOTE — Progress Notes (Signed)
Report to Ashley RN

## 2018-07-14 NOTE — Plan of Care (Signed)
  Problem: Education: Goal: Knowledge of General Education information will improve Description Including pain rating scale, medication(s)/side effects and non-pharmacologic comfort measures Outcome: Progressing Note:  Brother educated at length overnight. Brother plans to come back this morning to discuss goals of care.    Problem: Nutrition: Goal: Adequate nutrition will be maintained Outcome: Progressing Note:  TPN infusing   Problem: Elimination: Goal: Will not experience complications related to urinary retention Outcome: Progressing Note:  Foley in place   Problem: Elimination: Goal: Will not experience complications related to bowel motility Outcome: Not Progressing Note:  No BM overnight, no bowel sounds auscultated in 3/4 quadrants.

## 2018-07-14 NOTE — Progress Notes (Signed)
OT Cancellation Note  Patient Details Name: Angela [name] MRN: [id number] DOB: Mar 27, 1936   Cancelled Treatment:    Reason Eval/Treat Not Completed: Other (comment). Medical decline. Nsg asking to hold today.   Ramond Dial, OT/L   Acute OT Clinical Specialist Acute Rehabilitation Services Pager [phone] Office [phone]  07/14/2018, 10:04 AM

## 2018-07-14 NOTE — Progress Notes (Signed)
Angela [name]  RCB:[id number] DOB: 01/09/1936 DOA: 06/09/2018 PCP: [name], MD    LOS: 14 days   Reason for Consult / Chief Complaint:  Hypotension  Consulting MD and date of consult:  Dr. Sarajane Jews 07/10/2018  HPI/summary of hospital stay:  82 year old female with PMH significant for PUD with resultant severe pyloric stenosis with prior dilation attempts by EGD (4 since March, last EGD 8/13 which did not show malignancy) with ongoing weight loss (>25lbs over several months), and malnutrition with prior recommendations for a Jtube, PAF, HLD, HTN, CAD, and RA admitted 8/25 to [facility] for worsening nausea, increasing weakness, constipation, hyponatremia and dehydration. She was treated with ceftriaxone for UTI. She remained severely constipated despite medical management and CT abd showed severe gastric distention due to severe pyloric obstruction.  Surgery and GI was consulted.  She was decompressed with an NGT. Additional attempts at dilation felt likely not to help given her chronic gastric outlet obstruction.  Cardiology consulted for pre-op clearance and on 8/30, she underwent a gastrojejunostomy with placement of gastrostomy and jejunostomy tube.  She has required transfusions for ABLA, last transfusion 8/31, and foley catheter for urinary retention.  She was tolerated feeding via Jtube but developed postoperative ileus and therefore tubefeedings held on 9/4. TPN started 9/5.  Patient having worsening abdominal distention and pain with increased output from Gtube.  Developed hypotension and acute bleeding from around Gtube in the evening of 9/5 requiring transfer to ICU for further stabilization.  PCCM consulted to resume medical management while in ICU. Pt remained on vasopressors while in ICU.  Stress dose steroids initiated.  Vasopressors weaned off.  Patient declined with altered mental status, acidosis on 9/9.  Family requested comfort measures.   Subjective  RN reports pt more lethargic  this am, moaning / appears uncomfortable.  Family requesting to transition to comfort care.   Assessment & Plan:   Hypotension- likely multifactorial in setting of adrenal insufficiency, hypovolemic shock related to high output from Gtube, with concern for possible hemorraghic shock from bleeding around Gtube > no further bleeding noted after transfer.  P:  No further aggressive interventions  PRN morphine, ativan for pain / anxiety  PRN atropine gtt's for increased oral secretions if needed  R/o ABLA  - leaking from Gtube site with movement/ turning - s/p purse string sitch by surgery to help with bleeding from around Gtube; bleeding mostly noted from site with movement/ rolling, initial question of bleeding but hgb remained stable P:  No further lab draws  AKI Cr rising and UOP is deteriorating P: No further lab draws No hemodialysis, full comfort care  Postoperative adynamic ileus s/p open gastrojejunostomy for gastric obstruction 2/2 severe pyloric stenosis r/t PUD that has not been responsive to dilation and EGD - apparently had clear/ brown tinged liquid BM with lots of flatulence upon transfer to ICU  P:  CCS following, agrees with comfort focus  Acute Hypoxic Respiratory Failure  - Concern for aspiration - Acute onset with hypotension on floor with sats in the 80's on NRB, currently now on VM - Likely secondary to pain and abd distended, sats improved after manual decompression by surgery with 1.2L out Gtube P:  O2 if needed for comfort only   Compensated NAG Metabolic Acidosis - ? Saline contribution, RTA? P: No further labs  PAF  P:  No further tele monitoring  Comfort care  Protein Calorie Malnutrition P:  Stop TPN  Urinary Retention - s/p  catheter placement P:  Continue foley for EOL care  Rheumatoid Arthritis  P:  Hold home medications   Goals of Care  P: Discussed goals of care with brother and sister in law at bedside am 9/9.  They feel the  patient is suffering and want to transition to comfort focused care.  Reviewed stopping labs and aggressive interventions with family.  Will stop TPN, labs etc.  Full comfort care.  Palliative care discussion pending.  ? Disposition. Will keep in ICU for now.  Explained palliative rooms to family (more room for them etc).    Best Practice / Goals of Care / Disposition.   DVT prophylaxis: SCDs GI prophylaxis: pepcid  Diet: comfort feeding if patient requests  Mobility: bedrest Code Status: DNR, full comfort care Family Communication: patient & son-in-law updated on plan of care 9/9  Disposition / Summary of Today's Plan 07/14/18   Transition to full comfort care 9/9  Consultants: date of consult/date signed off (if applicable)/final recs  Cards- for preop clearance- signed off  Surgery and GI following   Procedures: 8/30 open gastrojejunostomy with gastrostomy and jejunostomy tube placement  Significant Diagnostic Tests: 8/27 CT abd/ pelvis >> Massively distended stomach with food and fluid present. This most likely is due to recurrence of severe pyloric stenosis by history. Circumferential thickening of the a no rectal mucosa of questionable significance. Correlate clinically. Degenerative disc disease at L3-4 and L4-5 as noted above. Moderate-sized bilateral pleural effusions with bibasilar atelectasis. Cardiomegaly.  9/5 AXR >> Continued massive dilatation of the stomach. Increased dilatation of small bowel loops. No free intraperitoneal air.  Micro Data: 8/25 UC  >> multiple species 9/5 BCx 2 >> 9/5 MRSA PCR >>  Antimicrobials:  8/25 Ceftriaxone >> 8/27 8/30 Cefotetan x 1 preop Unasyn 9/5 >> 9/6 Vanco 9/6 >> Cefepime 9/6 >>  Flagyl 9/6 >>   Objective    Examination: General: frail elderly female lying in bed, appears uncomfortable  HEENT: MM pink/dry, temporal wasting Neuro: opens eyes to voice, does not answer questions or move to command  CV: s1s2 rrr, no m/r/g PULM:  labored, shallow breathing, lungs bilaterally diminished but clear  GI: abd with midline staples, GJ tube Extremities: warm/dry, 3+ generalized pitting edema  Skin: no rashes or lesions  Blood pressure (!) 78/64, pulse 100, temperature (!) 96.2 F (35.7 C), temperature source Axillary, resp. rate 20, height 5\' 1"  (1.549 m), weight 73.4 kg, SpO2 95 %. CVP:  [2 mmHg-15 mmHg] 15 mmHg     Intake/Output Summary (Last 24 hours) at 07/14/2018 1146 Last data filed at 07/14/2018 1100 Gross per 24 hour  Intake 2706.2 ml  Output 2785 ml  Net -78.8 ml   Filed Weights   07/12/18 0414 07/13/18 1140 07/14/18 0500  Weight: 69 kg 71.5 kg 73.4 kg     Labs    CBC: Recent Labs  Lab 07/11/18 0120 07/11/18 0752  07/11/18 2019 07/12/18 0359 07/12/18 0811 07/13/18 0606 07/14/18 0415  WBC 3.5* 7.7  --   --  27.1*  --  18.3* 15.3*  NEUTROABS  --  6.8  --   --   --   --   --   --   HGB 15.7* 13.9   < > 13.5 14.1 14.1 13.1 11.7*  HCT 46.9* 40.3   < > 40.7 41.6 42.4 38.9 35.5*  MCV 96.7 94.8  --   --  95.2  --  96.5 97.3  PLT 155 148*  --   --  114*  --  60* 40*   < > = values in this interval not displayed.   Basic Metabolic Panel: Recent Labs  Lab 07/10/18 0406 07/11/18 0752 07/12/18 0359 07/12/18 0720 07/13/18 0606 07/14/18 0415  NA 132* 137 132*  --  133* 134*  K 4.4 5.1 4.4  --  4.4 4.3  CL 105 107 104  --  104 102  CO2 19* 19* 20*  --  23 24  GLUCOSE 122* 297* 243*  --  222* 150*  BUN 14 25* 35*  --  42* 45*  CREATININE 0.46 1.16* 1.34*  --  0.94 0.80  CALCIUM 7.9* 7.5* 7.3*  --  7.8* 8.4*  MG 1.8 2.0  --  1.8 2.0 2.3  PHOS 3.3 4.6  --  3.1 2.8 2.8   GFR: Estimated Creatinine Clearance: 52.2 mL/min (by C-G formula based on SCr of 0.8 mg/dL). Recent Labs  Lab 07/11/18 0120 07/11/18 0300 07/11/18 0752 07/12/18 0359 07/13/18 0606 07/14/18 0415  PROCALCITON 28.47  --  72.83 74.38  --   --   WBC 3.5*  --  7.7 27.1* 18.3* 15.3*  LATICACIDVEN  --  3.3* 3.1*  --   --   --     Liver Function Tests: Recent Labs  Lab 07/11/18 0752 07/12/18 0359  AST 21 27  ALT 15 18  ALKPHOS 20* 32*  BILITOT 2.5* 1.0  PROT 3.6* 3.5*  ALBUMIN 2.2* 1.7*   No results for input(s): LIPASE, AMYLASE in the last 168 hours. No results for input(s): AMMONIA in the last 168 hours.   ABG    Component Value Date/Time   PHART 7.297 (L) 07/13/2018 2151   PCO2ART 48.1 (H) 07/13/2018 2151   PO2ART 85.0 07/13/2018 2151   HCO3 23.6 07/13/2018 2151   TCO2 25 07/13/2018 2151   ACIDBASEDEF 3.0 (H) 07/13/2018 2151   O2SAT 95.0 07/13/2018 2151    Coagulation Profile: Recent Labs  Lab 07/11/18 0121  INR 1.50   Cardiac Enzymes: Recent Labs  Lab 07/10/18 1849  TROPONINI <0.03   HbA1C: Hgb A1c MFr Bld  Date/Time Value Ref Range Status  08/06/2009 10:53 PM  4.6 - 6.1 % Final   5.2 (NOTE) The ADA recommends the following therapeutic goal for glycemic control related to Hgb A1c measurement: Goal of therapy: <6.5 Hgb A1c  Reference: American Diabetes Association: Clinical Practice Recommendations 2010, Diabetes Care, 2010, 33: (Suppl  1).   CBG: Recent Labs  Lab 07/13/18 0700 07/13/18 1129 07/13/18 1752 07/14/18 0000 07/14/18 0624  GLUCAP 201* 168* 137* 157* 168*    Noe Gens, NP-C [facility] Pgr: [phone] or if no answer [phone] 07/14/2018, 11:47 AM

## 2018-07-14 NOTE — Progress Notes (Addendum)
PHARMACY - ADULT TOTAL PARENTERAL NUTRITION CONSULT NOTE   Pharmacy Consult:  TPN Indication:  Prolonged ileus  Patient Measurements: Height: 5\' 1"  (154.9 cm) Weight: 161 lb 13.1 oz (73.4 kg) IBW/kg (Calculated) : 47.8 TPN AdjBW (KG): 48.5 Body mass index is 30.58 kg/m. Usual Weight: 64 kg  Assessment:  [name] presented on 06/19/2018 with nausea and right arm tingling and numbness.  Patient has a history of severe pyloric stenosis and channel difficulties resulting in retained food.  She underwent gastrojejunostomy with insertion of gastrostomy (for draining) and jejunostomy (for feeding) tubes on 07/04/18.  TF started on 07/05/18 and patient tolerated goal rate.  On 07/07/18, imaging concerning for ileus.  TF held on 07/08/18 and repeat imaging revealed significant amount of enteric content, likely tube feeds.  She had multiple episodes of emesis on 07/09/18 and Pharmacy consulted to initiate TPN while work-up is in progress to rule out obstruction.  Per documentation, patient has moderate malnutrition.  She has poor PO intake for 2 months PTA and was at risk for refeeding when TF started.  Patient transferred to the ICU on 07/10/18 PM due to hypotension and Gtube site bleeding.  GI: hx GOO/GERD/PUD.  AXR shows stomach massively dilated. Baseline prealbumin low at 6, albumin down to 1.7. LBM 9/5. Bisacodyl PR, docusate, Senokot, Miralax, PRN Zofran (liquid meds preferred) Endo: no hx DM - CBGs elevated with IV steroid. CBGs mostly controlled (130-160s) Lantus up to 20 units, SSI Insulin requirements in the past 24 hours: 9 units + Lantus 20/d *sensitive or moderate SSI likely sufficient once off steroid Lytes: wnl today. CoCa 10.2 Renal: AKI resolving - SCr down to wnl. BUN up to 45. UOP up a little to 0.75ml/kg/hr Pulm: 6L of [place] Cards: CAD/HTN/HLD. MAP 60-90s, HR tachy. Weaned off pressors AC: Eliquis PTA for AFib >> Heparin, now on hold - Gtube site bleeding 9/5, s/p 2 units PRBC, H/H WNL, plts  down to 114 Hepatobil: LFTs WNL, tbili normalzed, lipase / TG WNL Neuro: chronic pain, pain score 0 - Voltaren gel, folate in TPN, PRN morphine ID: On broad abx for sepsis. Afebrile, WBC down to 15.3, PCT 73, BCx ngtd.  TPN Access: double lumen PICC placed 07/10/18  TPN start date: 07/10/18  Nutritional Goals (per RD rec on 9/5): 1600-1800 kCal, 80-90gm protein and >/= 1.6L fluid per day  Current Nutrition:  TPN  Plan:  Continue TPN at goal rate of 75 ml/hr TPN will provide 85g AA, 283g CHO and 45g ILE for a total of 1749 kCal, meeting 100% of patient's needs Electrolytes in TPN: Na 37mEq/L, max acetate Daily multivitamin, trace elements, folic acid 1mg , and famotidine 20mg  in TPN Continue sensitive SSI Q6H + Lantus 20 units daily while on steroids F/U TPN labs, clinical status and the need to adjust needs, trial TFs once GI function returns  ADDENDUM: Plan is to transition to full comfort care today. Will not make TPN bag for tonight   Elenor Quinones, PharmD, BCPS Clinical Pharmacist Phone number [phone] 07/14/2018 7:50 AM

## 2018-07-14 NOTE — Progress Notes (Signed)
Nutrition Brief Note  Chart reviewed. Pt now transitioning to comfort care. No further nutrition interventions warranted at this time.  Please re-consult as needed.    Krystopher Kuenzel MS, RD, LDN, CNSC [phone] Pager  [phone] Weekend/On-Call Pager     

## 2018-07-15 ENCOUNTER — Other Ambulatory Visit: Payer: Self-pay | Admitting: Physical Medicine & Rehabilitation

## 2018-07-15 DIAGNOSIS — M05712 Rheumatoid arthritis with rheumatoid factor of left shoulder without organ or systems involvement: Principal | ICD-10-CM

## 2018-07-15 DIAGNOSIS — M4316 Spondylolisthesis, lumbar region: Secondary | ICD-10-CM

## 2018-07-15 DIAGNOSIS — D62 Acute posthemorrhagic anemia: Secondary | ICD-10-CM

## 2018-07-15 DIAGNOSIS — M05711 Rheumatoid arthritis with rheumatoid factor of right shoulder without organ or systems involvement: Secondary | ICD-10-CM

## 2018-07-15 MED ORDER — MORPHINE 100MG IN NS 100ML (1MG/ML) PREMIX INFUSION
2.0000 mg/h | INTRAVENOUS | Status: DC
  Filled 2018-07-15 (×2): qty 100

## 2018-07-15 MED ORDER — LORAZEPAM 2 MG/ML IJ SOLN: 1.0000 mg | INTRAMUSCULAR | Status: DC | PRN

## 2018-07-15 MED ORDER — MORPHINE BOLUS VIA INFUSION
2.0000 mg | INTRAVENOUS | Status: DC | PRN
  Filled 2018-07-15: qty 2

## 2018-07-15 MED ORDER — GLYCOPYRROLATE 0.2 MG/ML IJ SOLN: 0.2000 mg | INTRAMUSCULAR | Status: DC | PRN

## 2018-07-15 MED ORDER — LORAZEPAM 2 MG/ML IJ SOLN: 1.0000 mg | INTRAMUSCULAR | Status: DC

## 2018-07-16 LAB — CULTURE, BLOOD (ROUTINE X 2)
Culture: NO GROWTH
Culture: NO GROWTH

## 2018-07-22 ENCOUNTER — Other Ambulatory Visit: Payer: Self-pay

## 2018-07-22 NOTE — Patient Outreach (Signed)
NOTE: Entered chart to remove myself from careteam as patient was showing up on my missing elements report. I was not on the care team so reports was incorrect.  Tomasa Rand, RN, BSN, CEN [facility] [employer] [phone]

## 2018-08-05 NOTE — Progress Notes (Signed)
[facility] PROGRESS NOTE  Angela [name] KDX:[id number] DOB: [birth year] DOA: 06/28/2018 PCP: [name], MD  Brief summary   From ICU: 82 year old female with PMH significant for PUD with resultant severe pyloric stenosis with prior dilation attempts by EGD (4 since March, last EGD 8/13 which did not show malignancy) with ongoing weight loss (>25lbs over several months), and malnutrition with prior recommendations for a Jtube, PAF, HLD, HTN, CAD, and RA admitted 8/25 to [facility] for worsening nausea, increasing weakness, constipation, hyponatremia and dehydration. She was treated with ceftriaxone for UTI. She remained severely constipated despite medical management and CT abd showed severe gastric distention due to severe pyloric obstruction.  Surgery and GI was consulted.  She was decompressed with an NGT. Additional attempts at dilation felt likely not to help given her chronic gastric outlet obstruction.  Cardiology consulted for pre-op clearance and on 8/30, she underwent a gastrojejunostomy with placement of gastrostomy and jejunostomy tube.  She has required transfusions for ABLA, last transfusion 8/31, and foley catheter for urinary retention.  She was tolerated feeding via Jtube but developed postoperative ileus and therefore tubefeedings held on 9/4. TPN started 9/5.  Patient having worsening abdominal distention and pain with increased output from Gtube.  Developed hypotension and acute bleeding from around Gtube in the evening of 9/5 requiring transfer to ICU for further stabilization.  PCCM consulted to resume medical management while in ICU. Pt remained on vasopressors while in ICU.  Stress dose steroids initiated.  Vasopressors weaned off.  Patient declined with altered mental status, acidosis on 9/9.  Family requested comfort measures. Patient is transferred to hospitralist service   Assessment/Plan:  Hypotension. AKI  Postoperative adynamic ileus s/p open gastrojejunostomy for  gastric obstruction 2/2 severe pyloric stenosis r/t PUD that has not been responsive to dilation and EGD Acute Hypoxic Respiratory Failure  Compensated NAG Metabolic Acidosis - ? Saline contribution, RTA? PAF  Protein Calorie Malnutrition Urinary Retention Rheumatoid Arthritis  Aspiration pneumonia    Prognosis is very poor. Patient remains critically ill. She is under comfort care. Cont comfort measures.  -consulted palliative care, likely needs GIP  Code Status: DNR Family Communication: d/w RN. No family at the bedside (indicate person spoken with, relationship, and if by phone, the number) Disposition Plan: hospice vs GIP   Consultants: Cards- for preop clearance- signed off  Surgery and GI following  PCCM  8/30 open gastrojejunostomy with gastrostomy and jejunostomy tube placement  Significant Diagnostic Tests: 8/27 CT abd/ pelvis >> Massively distended stomach with food and fluid present. This most likely is due to recurrence of severe pyloric stenosis by history. Circumferential thickening of the a no rectal mucosa of questionable significance. Correlate clinically. Degenerative disc disease at L3-4 and L4-5 as noted above. Moderate-sized bilateral pleural effusions with bibasilar atelectasis. Cardiomegaly.  9/5 AXR >> Continued massive dilatation of the stomach. Increased dilatation of small bowel loops. No free intraperitoneal air.  Micro Data: 8/25 UC  >> multiple species 9/5 BCx 2 >> 9/5 MRSA PCR >>  Antimicrobials:  8/25 Ceftriaxone >> 8/27 8/30 Cefotetan x 1 preop Unasyn 9/5 >> 9/6 Vanco 9/6 >> Cefepime 9/6 >>  Flagyl 9/6 >>   HPI/Subjective: Remains critically ill. Does not follow commands.   Objective: Vitals:   07/14/18 1800 2018/08/01 0715  BP:  (!) 65/49  Pulse: (!) 111 (!) 44  Resp: 18 20  Temp:    SpO2: (!) 76% (!) 75%    Intake/Output Summary (Last 24 hours) at 01-Aug-2018 0734  Last data filed at 08-11-18 0700 Gross per 24 hour  Intake  519.91 ml  Output 2325 ml  Net -1805.09 ml   Filed Weights   07/12/18 0414 07/13/18 1140 07/14/18 0500  Weight: 69 kg 71.5 kg 73.4 kg    Exam:   General:  Acutely ill   Cardiovascular: s1,s2 tachycardia   Respiratory: rales BL  Abdomen: soft, mild tender   Musculoskeletal: edema legs    Data Reviewed: Basic Metabolic Panel: Recent Labs  Lab 07/10/18 0406 07/11/18 0752 07/12/18 0359 07/12/18 0720 07/13/18 0606 07/14/18 0415  NA 132* 137 132*  --  133* 134*  K 4.4 5.1 4.4  --  4.4 4.3  CL 105 107 104  --  104 102  CO2 19* 19* 20*  --  23 24  GLUCOSE 122* 297* 243*  --  222* 150*  BUN 14 25* 35*  --  42* 45*  CREATININE 0.46 1.16* 1.34*  --  0.94 0.80  CALCIUM 7.9* 7.5* 7.3*  --  7.8* 8.4*  MG 1.8 2.0  --  1.8 2.0 2.3  PHOS 3.3 4.6  --  3.1 2.8 2.8   Liver Function Tests: Recent Labs  Lab 07/11/18 0752 07/12/18 0359  AST 21 27  ALT 15 18  ALKPHOS 20* 32*  BILITOT 2.5* 1.0  PROT 3.6* 3.5*  ALBUMIN 2.2* 1.7*   No results for input(s): LIPASE, AMYLASE in the last 168 hours. No results for input(s): AMMONIA in the last 168 hours. CBC: Recent Labs  Lab 07/11/18 0120 07/11/18 0752  07/11/18 2019 07/12/18 0359 07/12/18 0811 07/13/18 0606 07/14/18 0415  WBC 3.5* 7.7  --   --  27.1*  --  18.3* 15.3*  NEUTROABS  --  6.8  --   --   --   --   --   --   HGB 15.7* 13.9   < > 13.5 14.1 14.1 13.1 11.7*  HCT 46.9* 40.3   < > 40.7 41.6 42.4 38.9 35.5*  MCV 96.7 94.8  --   --  95.2  --  96.5 97.3  PLT 155 148*  --   --  114*  --  60* 40*   < > = values in this interval not displayed.   Cardiac Enzymes: Recent Labs  Lab 07/10/18 1849  TROPONINI <0.03   BNP (last 3 results) Recent Labs    06/10/18 1128  BNP 127.6*    ProBNP (last 3 results) No results for input(s): PROBNP in the last 8760 hours.  CBG: Recent Labs  Lab 07/13/18 0700 07/13/18 1129 07/13/18 1752 07/14/18 0000 07/14/18 0624  GLUCAP 201* 168* 137* 157* 168*    Recent Results  (from the past 240 hour(s))  MRSA PCR Screening     Status: None   Collection Time: 07/10/18  8:06 PM  Result Value Ref Range Status   MRSA by PCR NEGATIVE NEGATIVE Final    Comment:        The GeneXpert MRSA Assay (FDA approved for NASAL specimens only), is one component of a comprehensive MRSA colonization surveillance program. It is not intended to diagnose MRSA infection nor to guide or monitor treatment for MRSA infections. Performed at [facility], [place] [address], [place], [place] [address]   Culture, blood (routine x 2)     Status: None (Preliminary result)   Collection Time: 07/11/18 12:49 AM  Result Value Ref Range Status   Specimen Description BLOOD RIGHT HAND  Final   Special Requests  Final    BOTTLES DRAWN AEROBIC ONLY Blood Culture results may not be optimal due to an inadequate volume of blood received in culture bottles   Culture   Final    NO GROWTH 4 DAYS Performed at [facility], [place] [address] [place], [place] [address]    Report Status PENDING  Incomplete  Culture, blood (routine x 2)     Status: None (Preliminary result)   Collection Time: 07/11/18  1:03 AM  Result Value Ref Range Status   Specimen Description BLOOD RIGHT HAND  Final   Special Requests   Final    BOTTLES DRAWN AEROBIC ONLY Blood Culture results may not be optimal due to an inadequate volume of blood received in culture bottles   Culture   Final    NO GROWTH 4 DAYS Performed at [facility], [place] [address], [place], [place] [address]    Report Status PENDING  Incomplete     Studies: No results found.  Scheduled Meds: . chlorhexidine  15 mL Mouth Rinse BID  . diclofenac sodium  2 g Topical QID  . mouth rinse  15 mL Mouth Rinse q12n4p  . sodium chloride flush  3 mL Intravenous Q12H   Continuous Infusions: . sodium chloride 10 mL/hr at July 20, 2018 0700    Principal Problem:   Gastric outlet obstruction Active Problems:   Cervical spondylosis with  myelopathy   Rheumatoid arthritis (HCC)   Hyponatremia   Dyslipidemia   Essential hypertension, benign   Aortic stenosis   CAD (coronary artery disease)   Constipation   PAF (paroxysmal atrial fibrillation) ([place])   Cardiomyopathy ([place])   Malnutrition of moderate degree   Acute blood loss anemia   Septic shock (HCC)   Goals of care, counseling/discussion    Time spent: >35 minutes     Kinnie Feil  [facility] Pager [phone]. If 7PM-7AM, please contact night-coverage at [email or web address], password [facility] 07/20/2018, 7:34 AM  LOS: 15 days

## 2018-08-05 NOTE — Progress Notes (Signed)
Patient passed away at 0916. Verified with Latricia Heft, RN and Thersa Salt, RN. Strip printed and in chart.

## 2018-08-05 NOTE — Progress Notes (Signed)
[facility]/Trauma Progress Note  11 Days Post-Op   Assessment/Plan CAD s/p stent in 2008, not on ASA due to frequent ulcers SVT HTN Atrial fibrillation PUD  Gastroparesis and GOO Rheumatoid arthritis/Spondylolisthesis/Chronic pain syndrome Osteoporosis UTI - on IV rocephin  Constipation   Gastric outlet obstruction secondary to severe pyloric stenosis - EGD with bx 8/13 no malignancy, pyloric stenosis most likely due to PUD - past balloon dilations 01/10/18, 01/24/18, 04/15/18, 06/17/18 - all by Dr. Therisa Doyne  S/POpengastrojejunostomy, with gastrostomy andfeedingjejunostomy tubes - 06/26/2018 - Dr. Georgette Dover - Post-op ileus  - AXR with significant amount of retained food in the patients distended stomach - stomach irrigated and suctioned evening of 9/5, G-tube reinforced with a pursestring suture by BT due to leaking of gastric contents. - SB protocol per J-tube 9/5 shows interval decompression of the stomach but significant constipation  -Continuegastrostomy to LIWS -Continue to holdJ-tube feeds;flush every 4 hours and before/after medication use. Liquid meds per tube preferred, meds can be finely crushed and administered if necessary.   FEN:IVF VTE: SCD's  UR:[id number], Flagyl & Cefepime 09/06-09/10 Foley:per medicine Follow up:Dr. Georgette Dover  PLAN:palliative care consult pending, family has decided on comfort care. We will sign off. Please page [place] with any further needs.    LOS: 15 days    Subjective: CC: GOO  Pt will not arouse, no family at bedside. Agonal breathing.   Objective: Vital signs in last 24 hours: Pulse Rate:  [44-117] 44 (09/10 0715) Resp:  [17-23] 20 (09/10 0715) BP: (65-109)/(49-77) 65/49 (09/10 0715) SpO2:  [73 %-97 %] 75 % (09/10 0715) Last BM Date: 07/10/18  Intake/Output from previous day: 09/09 0701 - 09/10 0700 In: 519.9 [I.V.:319.9; IV Piggyback:0.1] Out: 2325 [Urine:325; Drains:2000] Intake/Output this shift: Total  I/O In: 10 [I.V.:10] Out: -   PE: Gen:  Ill appearing, cachectic, non responsive  Pulm:  agonal breathing  Abd: Soft, distended, absent BS, G tube site C/D/I, j tube site appears well, midline surgical incision with staples. No leaking around G tube Skin: diffuse anasarca   Anti-infectives: Anti-infectives (From admission, onward)   Start     Dose/Rate Route Frequency Ordered Stop   07/12/18 1315  vancomycin (VANCOCIN) IVPB 1000 mg/200 mL premix  Status:  Discontinued     1,000 mg 200 mL/hr over 60 Minutes Intravenous Every 24 hours 07/11/18 1310 07/14/18 1145   07/11/18 1315  vancomycin (VANCOCIN) 1,250 mg in sodium chloride 0.9 % 250 mL IVPB     1,250 mg 166.7 mL/hr over 90 Minutes Intravenous  Once 07/11/18 1310 07/11/18 1512   07/11/18 1315  ceFEPIme (MAXIPIME) 2 g in sodium chloride 0.9 % 100 mL IVPB  Status:  Discontinued     2 g 200 mL/hr over 30 Minutes Intravenous Every 24 hours 07/11/18 1310 07/14/18 1145   07/11/18 1245  metroNIDAZOLE (FLAGYL) IVPB 500 mg  Status:  Discontinued     500 mg 100 mL/hr over 60 Minutes Intravenous Every 8 hours 07/11/18 1231 07/14/18 1145   07/11/18 1213  Ampicillin-Sulbactam (UNASYN) 3 g in sodium chloride 0.9 % 100 mL IVPB  Status:  Discontinued     3 g 200 mL/hr over 30 Minutes Intravenous Every 6 hours 07/11/18 1138 07/11/18 1231   07/10/18 2300  Ampicillin-Sulbactam (UNASYN) 3 g in sodium chloride 0.9 % 100 mL IVPB  Status:  Discontinued     3 g 200 mL/hr over 30 Minutes Intravenous Every 8 hours 07/10/18 2255 07/11/18 1138   06/13/2018 0915  cefoTEtan (CEFOTAN) 1  g in sodium chloride 0.9 % 100 mL IVPB     1 g 200 mL/hr over 30 Minutes Intravenous To Surgery 06/24/2018 0830 06/16/2018 1132   06/30/18 0645  cefTRIAXone (ROCEPHIN) 1 g in sodium chloride 0.9 % 100 mL IVPB  Status:  Discontinued     1 g 200 mL/hr over 30 Minutes Intravenous Every 24 hours 06/30/18 0643 07/02/18 1655   06/30/2018 1500  cefTRIAXone (ROCEPHIN) 1 g in sodium chloride  0.9 % 100 mL IVPB  Status:  Discontinued     1 g 200 mL/hr over 30 Minutes Intravenous Every 24 hours 06/17/2018 1451 06/14/2018 1528      Lab Results:  Recent Labs    07/13/18 0606 07/14/18 0415  WBC 18.3* 15.3*  HGB 13.1 11.7*  HCT 38.9 35.5*  PLT 60* 40*   BMET Recent Labs    07/13/18 0606 07/14/18 0415  NA 133* 134*  K 4.4 4.3  CL 104 102  CO2 23 24  GLUCOSE 222* 150*  BUN 42* 45*  CREATININE 0.94 0.80  CALCIUM 7.8* 8.4*   PT/INR No results for input(s): LABPROT, INR in the last 72 hours. CMP     Component Value Date/Time   NA 134 (L) 07/14/2018 0415   NA 135 06/09/2018 1324   K 4.3 07/14/2018 0415   CL 102 07/14/2018 0415   CO2 24 07/14/2018 0415   GLUCOSE 150 (H) 07/14/2018 0415   BUN 45 (H) 07/14/2018 0415   BUN 13 06/09/2018 1324   CREATININE 0.80 07/14/2018 0415   CREATININE 0.60 05/25/2016 1252   CALCIUM 8.4 (L) 07/14/2018 0415   PROT 3.5 (L) 07/12/2018 0359   ALBUMIN 1.7 (L) 07/12/2018 0359   AST 27 07/12/2018 0359   ALT 18 07/12/2018 0359   ALKPHOS 32 (L) 07/12/2018 0359   BILITOT 1.0 07/12/2018 0359   GFRNONAA >60 07/14/2018 0415   GFRAA >60 07/14/2018 0415   Lipase     Component Value Date/Time   LIPASE 34 06/28/2018 1148    Studies/Results: No results found.    Kalman Drape , [facility] 07/20/18, 8:30 AM  Pager: [phone] Mon-Wed, Friday 7:00am-4:30pm Thurs 7am-11:30am  Consults: [phone]

## 2018-08-05 NOTE — Progress Notes (Signed)
Palliative Medicine RN Note:  Consult order noted. I saw patient & spoke with brother at bedside, who will relay info to pt's daughter.  Patient has been transferred to [facility] service but remains on 74M. She has labored breathing with a rate around 28/minute. Nursing has placed blue pads under extremities due to weeping that is exceeding the dressing's absorbancy. She is non-responsive to me. She is pale with open mouth breathing.   She has required frequent doses of prn morphine. I discussed patient with Dr Hilma Favors. She changed morphine to a continuous infusion and adjusted/scheduled ativan, as well as added Robinul. Patient is NOT stable for transfer to a hospice facility at this time due to respiratory status and her inability to tolerate ambulance transport. Dr Hilma Favors does support a transfer to [place], and this was discussed with Ms Cockerham's brother.  I will follow up this afternoon to ensure efficacy of morphine infusion.   Marjie Skiff Luetta Piazza, RN, BSN, [facility] Palliative Medicine Team Jul 29, 2018 9:00 AM Office [phone]

## 2018-08-05 NOTE — Death Summary Note (Signed)
Death Summary  Angela [name] XFG:[id number] DOB: 1936-09-27 DOA: 07-20-2018  PCP: [name], MD PCP/Office notified: epic  Admit date: 2018/07/20 Date of Death: 08-05-18  Final Diagnoses:  Principal Problem:   Gastric outlet obstruction Active Problems:   Cervical spondylosis with myelopathy   Rheumatoid arthritis ([place])   Hyponatremia   Dyslipidemia   Essential hypertension, benign   Aortic stenosis   CAD (coronary artery disease)   Constipation   PAF (paroxysmal atrial fibrillation) (HCC)   Cardiomyopathy (HCC)   Malnutrition of moderate degree   Acute blood loss anemia   Septic shock (HCC)   Goals of care, counseling/discussion      History of present illness:   82 year old female with PMH significant for PUD with resultant severe pyloric stenosis with prior dilation attempts by EGD (4 since March, last EGD 8/13 which did not show malignancy) with ongoing weight loss (>25lbs over several months), and malnutrition with prior recommendations for a Jtube, PAF, HLD, HTN, CAD, and RA admitted 2023/07/21 to [facility] for worsening nausea, increasing weakness, constipation, hyponatremia and dehydration. She was treated with ceftriaxone for UTI. She remained severely constipated despite medical management and CT abd showed severe gastric distention due to severe pyloric obstruction. Surgery and GI was consulted. She was decompressed with an NGT. Additional attempts at dilation felt likely not to help given her chronic gastric outlet obstruction. Cardiology consulted for pre-op clearance and on 8/30, she underwent a gastrojejunostomy with placement of gastrostomy and jejunostomy tube. She has required transfusions for ABLA, last transfusion 8/31, and foley catheter for urinary retention. She was tolerated feeding via Jtube but developed postoperative ileus and therefore tubefeedings held on 9/4. TPN started 9/5. Patient having worsening abdominal distention and pain with increased output from  Gtube. Developed hypotension and acute bleeding from around Gtube in the evening of 9/5 requiring transfer to ICU for further stabilization. PCCM consulted to resume medical management while in ICU. Pt remained on vasopressors while in ICU. Stress dose steroids initiated. Vasopressors weaned off. Patient declined with altered mental status, acidosis on 9/9. Family requested comfort measures.   Hospital Course:   Hypotension. AKI  Postoperative adynamic ileus s/p open gastrojejunostomy for gastric obstruction 2/2 severe pyloric stenosis r/t PUD that has not been responsive to dilation and EGD Acute Hypoxic Respiratory Failure  Compensated NAG Metabolic Acidosis - ? Saline contribution, RTA? PAF  Protein Calorie Malnutrition Urinary Retention Rheumatoid Arthritis  Aspiration pneumonia    Prognosis was very poor with multiorgan failure, aspiration pneumonia, sepsis, severe malnutrition. Patient died on 08/05/2018 at 9.16 AM under comfort care.   Time: 9.16  Signed:  Rowe Clack N  [facility] Aug 05, 2018, 12:02 PM

## 2018-08-05 NOTE — Progress Notes (Signed)
Palliative Medicine RN Note: Rec'd a call from RN Mickel Baas on 276M that pt passed away. Brother and sister in law present. They report that pt's daughter does not want to come to [facility]. They will visit for a little while and will let the nurses know when they leave.  Family is very grateful to the staff who cared for her on 276M.  Marjie Skiff Fahmida Jurich, RN, BSN, [facility] Palliative Medicine Team Aug 03, 2018 9:38 AM Office [phone]

## 2018-08-05 DEATH — deceased

## 2018-08-25 ENCOUNTER — Ambulatory Visit: Payer: Medicare Other | Admitting: Cardiovascular Disease

## 2018-08-27 ENCOUNTER — Encounter: Payer: Medicare Other | Admitting: Physical Medicine & Rehabilitation

## 2018-10-17 ENCOUNTER — Ambulatory Visit: Payer: Medicare Other | Admitting: Cardiology

## 2019-05-01 IMAGING — DX DG ABDOMEN ACUTE W/ 1V CHEST
3 series · 3 of 3 positions shown · non-contrast
Comparison: Radiographs of May 14, 2018.

CLINICAL DATA: Nausea.

EXAM:
DG ABDOMEN ACUTE W/ 1V CHEST

[abdomen erect]
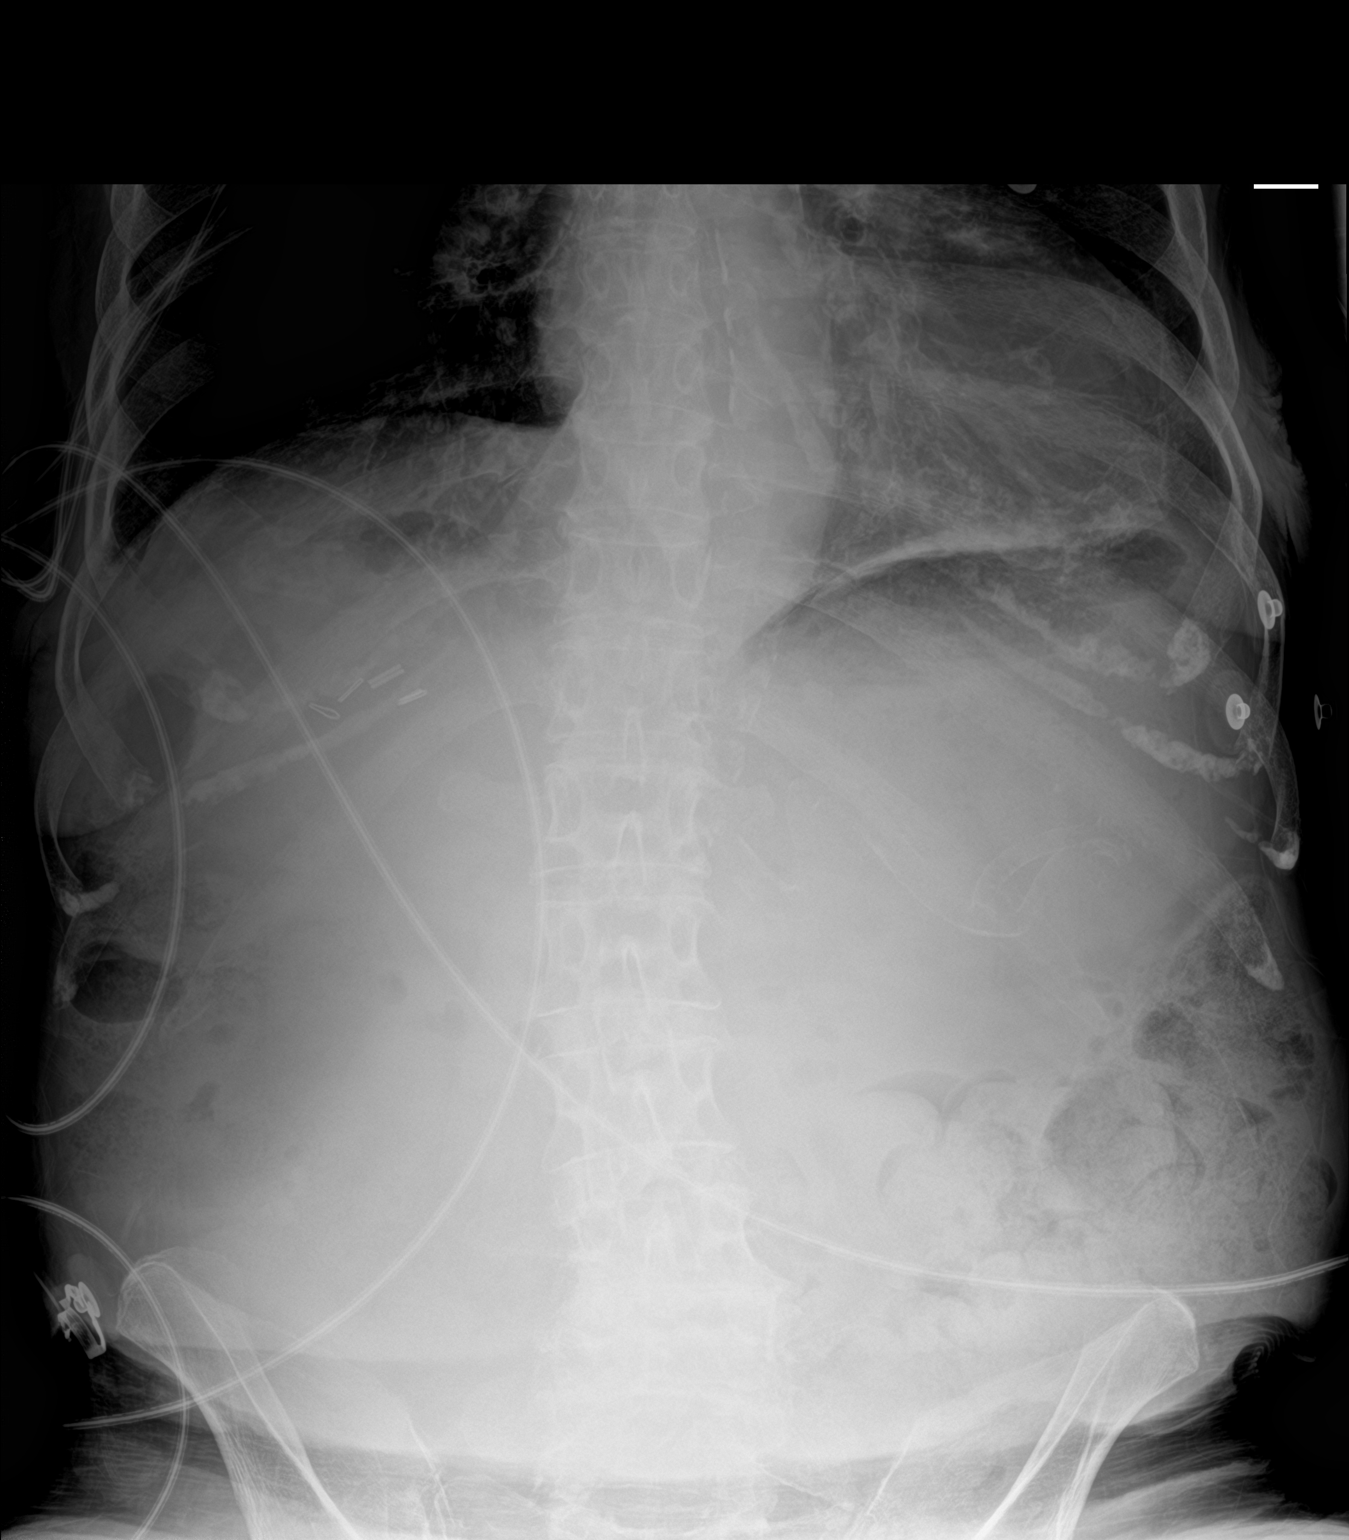

[abdomen supine]
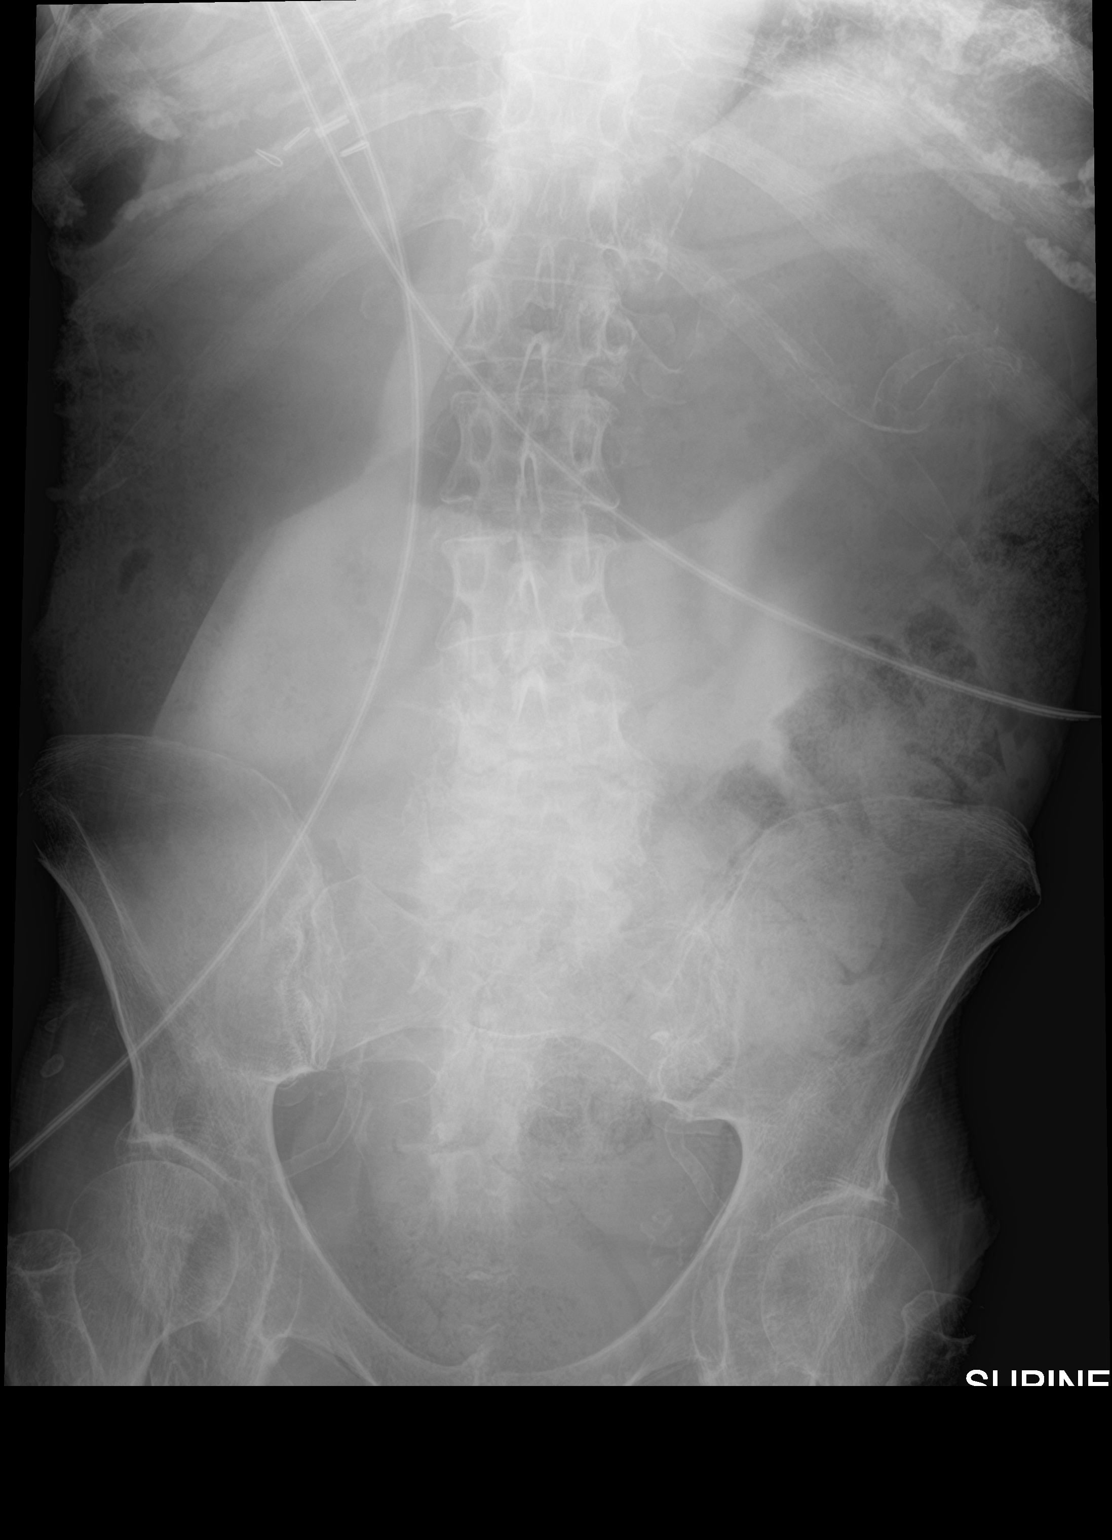

[chest ap]
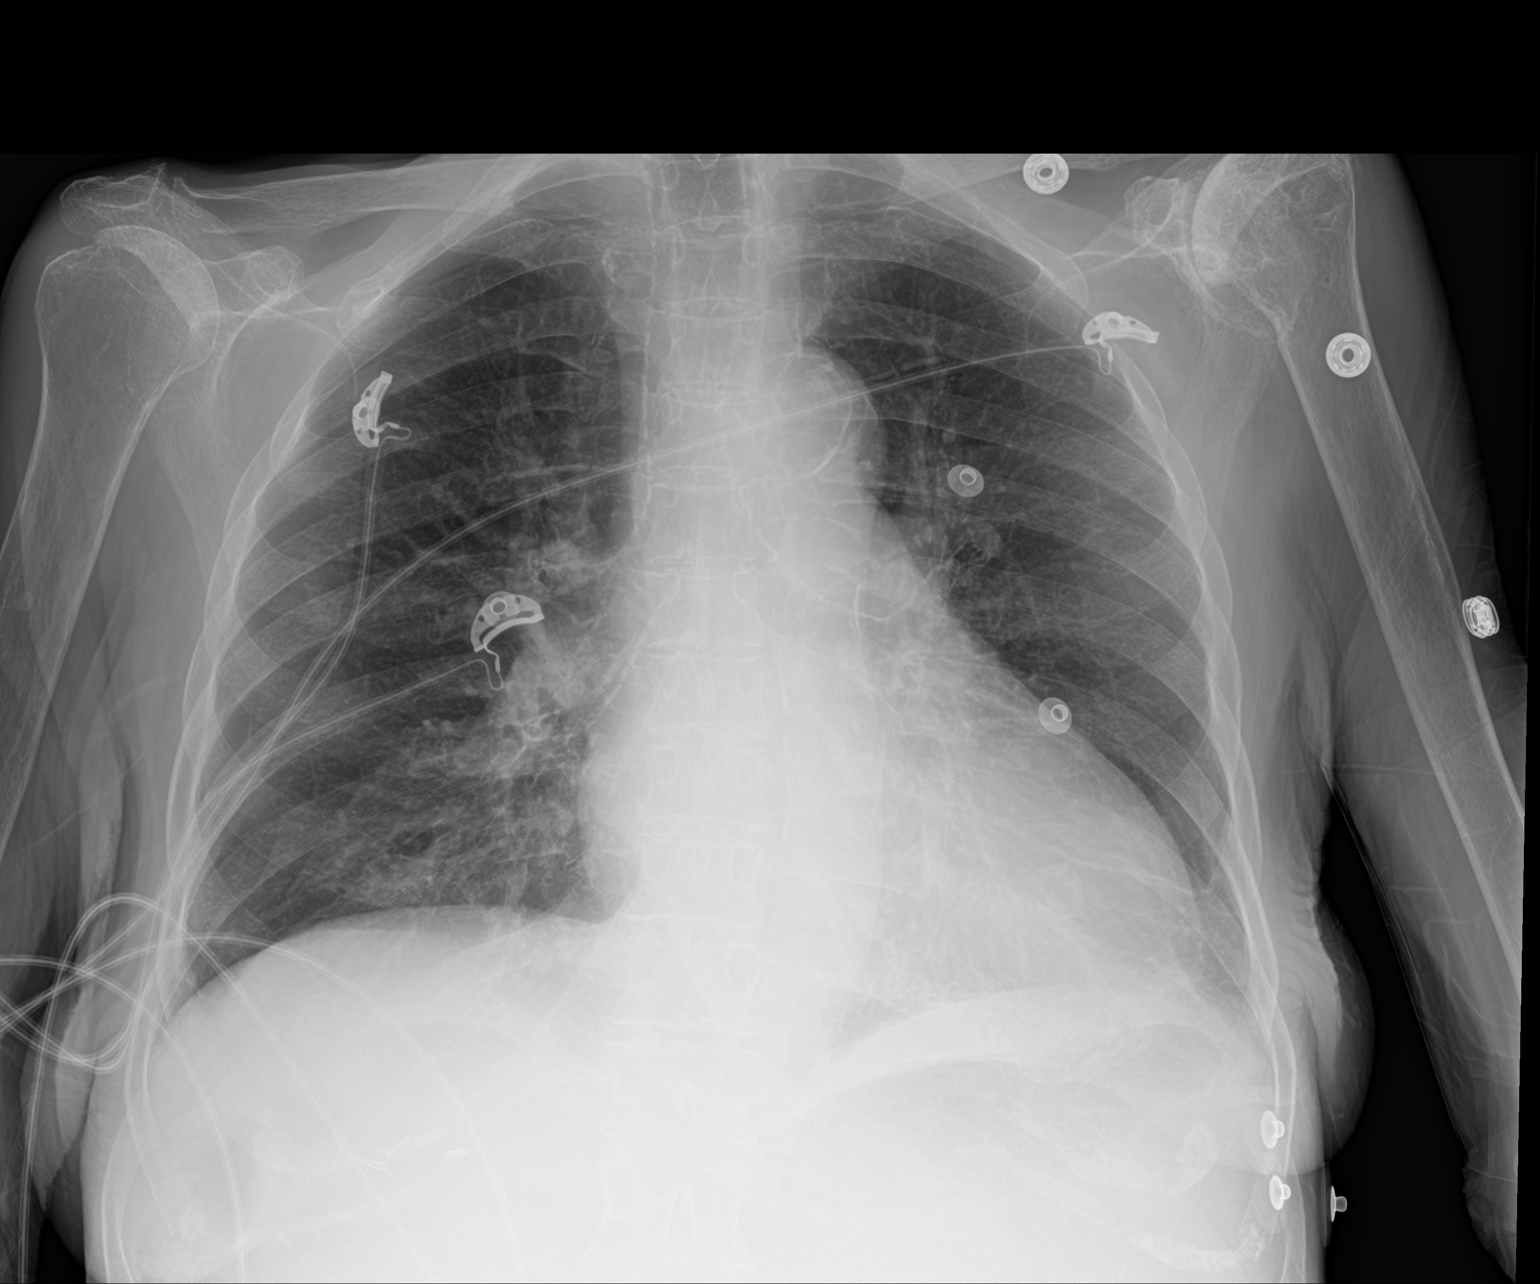

[3 of 3 positions shown; findings below may reference images not displayed]

FINDINGS: Large amount of stool seen throughout the colon. No abnormal bowel
dilatation is noted.. No radiopaque calculi or other significant
radiographic abnormality is seen. Stable cardiomegaly. Status post
cholecystectomy. Atherosclerosis of thoracic aorta is noted. Both
lungs are clear.
IMPRESSION: Large stool burden is noted. No abnormal bowel dilatation is noted.
No acute cardiopulmonary disease.

Aortic Atherosclerosis ([id number]).

## 2019-05-03 IMAGING — CT CT ABD-PELV W/O CM
2 of 4 series · 15 of 46 positions shown, 17 images · non-contrast
Comparison: Chest and abdomen films of 06/09/2017 and CT abdomen
pelvis of 01/10/2016

CLINICAL DATA: Abdominal distention, pain, nausea, history of
severe pyloric stenosis

EXAM:
CT ABDOMEN AND PELVIS WITHOUT CONTRAST
TECHNIQUE: Multidetector CT imaging of the abdomen and pelvis was performed
following the standard protocol without IV contrast.

[Series 3: a/p w/o 5mm · axial · non-contrast · 0.73mm/px · z∈[+669,+1074]mm · 12 of 95 slices shown, 14 images]
[im 7/95  soft-tissue]
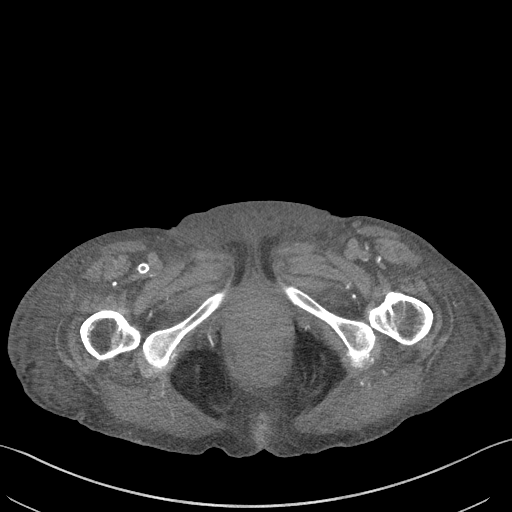
[im 7/95  bone]
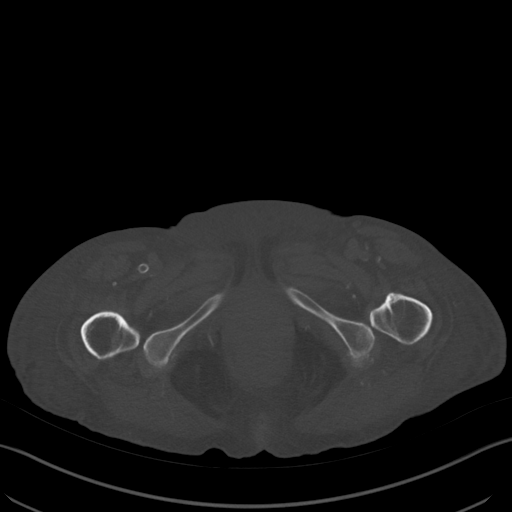
[im 14/95  soft-tissue]
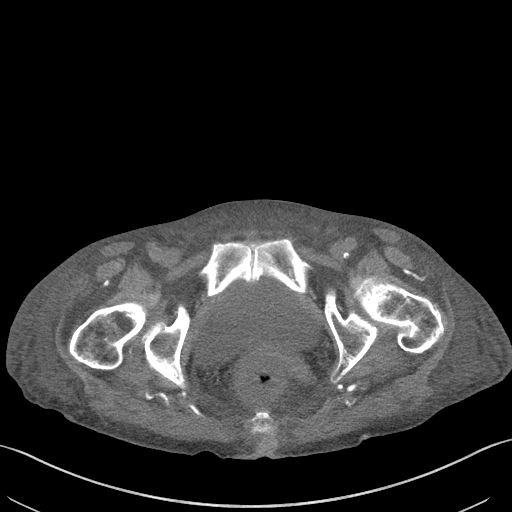
[im 21/95  soft-tissue]
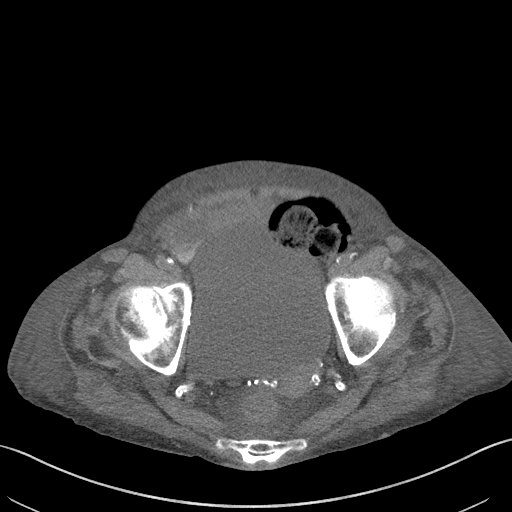
[im 28/95  soft-tissue]
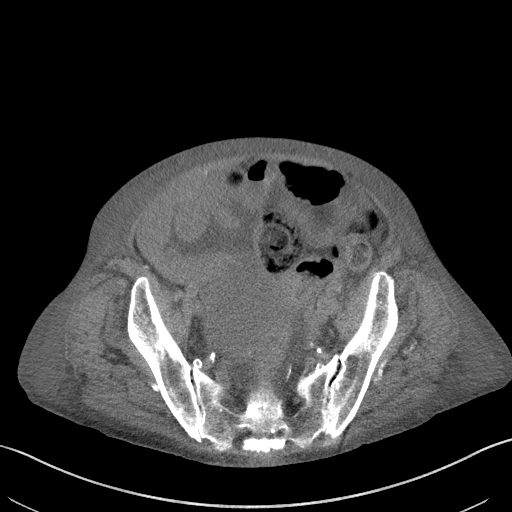
[im 35/95  soft-tissue]
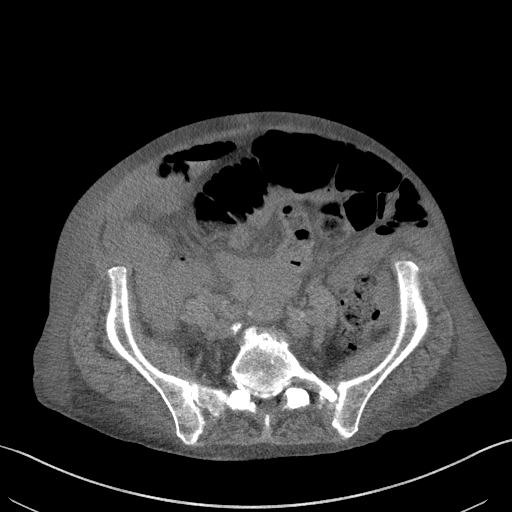
[im 42/95  soft-tissue]
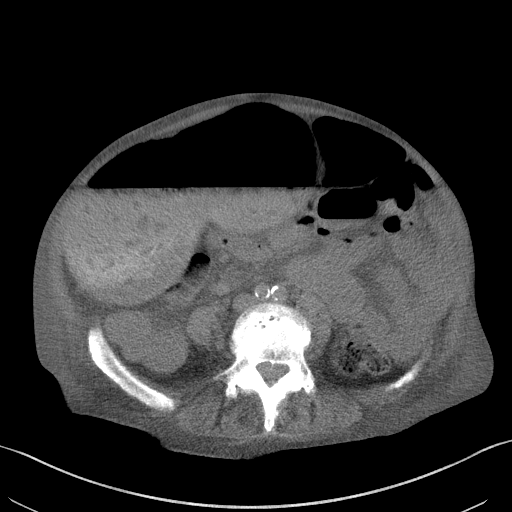
[im 53/95  soft-tissue]
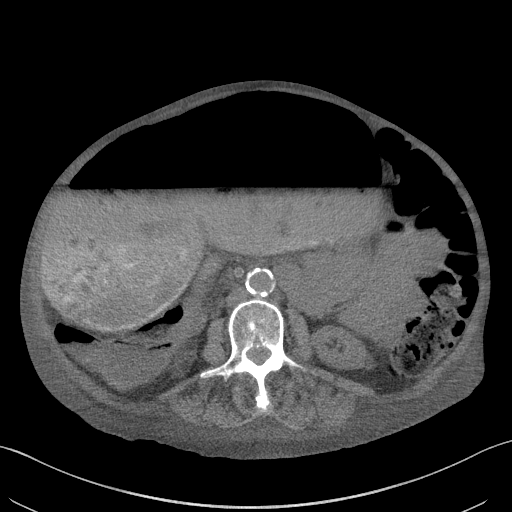
[im 60/95  soft-tissue]
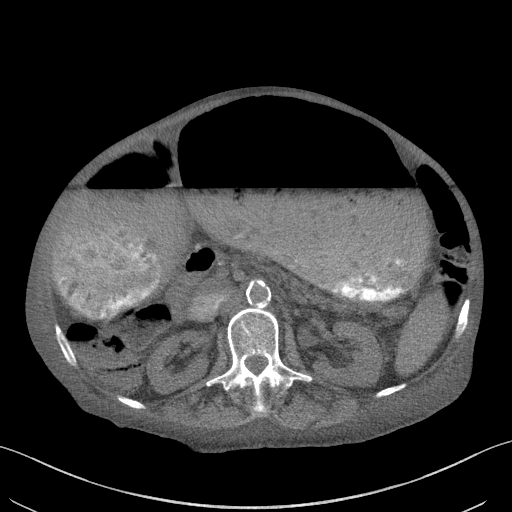
[im 67/95  soft-tissue]
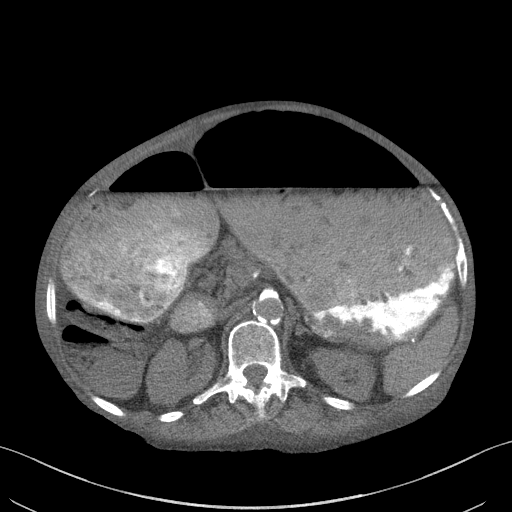
[im 67/95  bone]
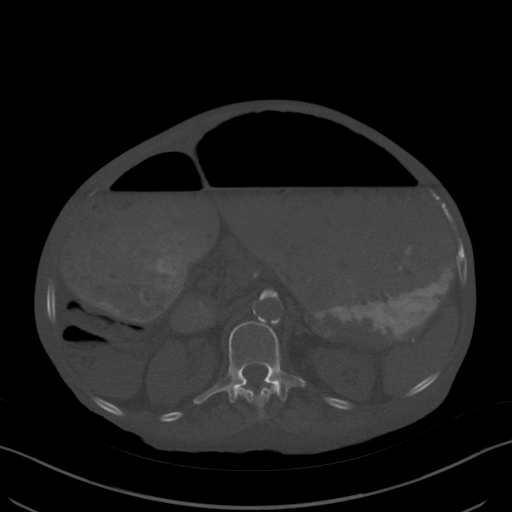
[im 74/95  soft-tissue]
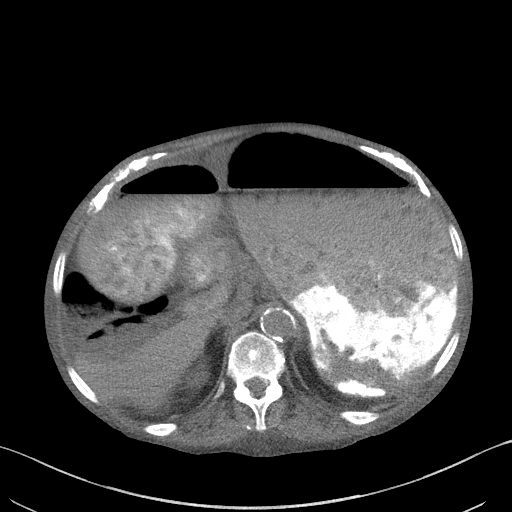
[im 81/95  soft-tissue]
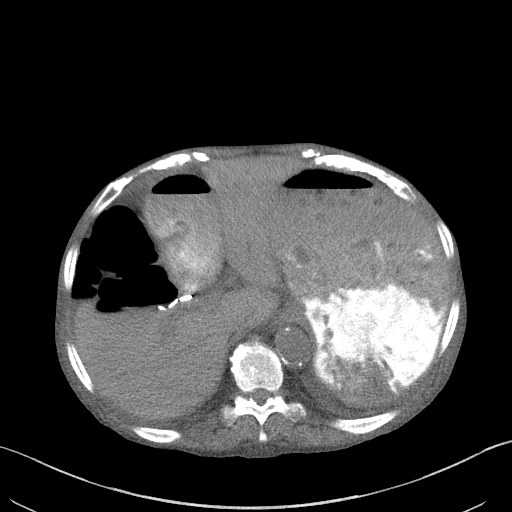
[im 88/95  soft-tissue]
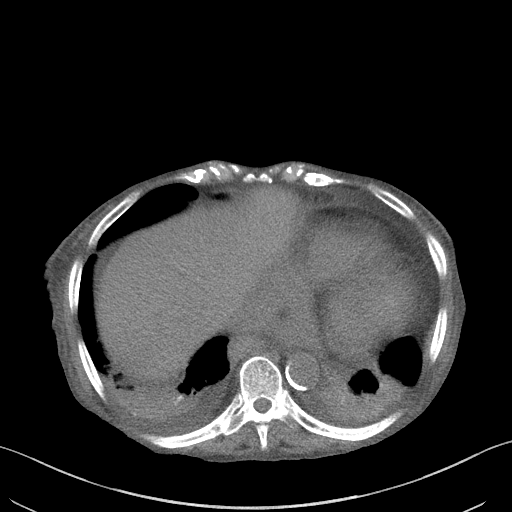

[Series 6: a/p w/o cor · coronal · non-contrast · 0.81mm/px · 3 of 151 slices shown]
[im 51/151  soft-tissue]
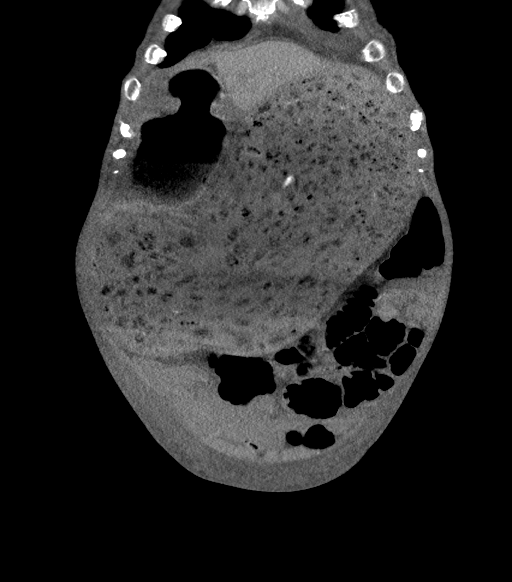
[im 67/151  soft-tissue]
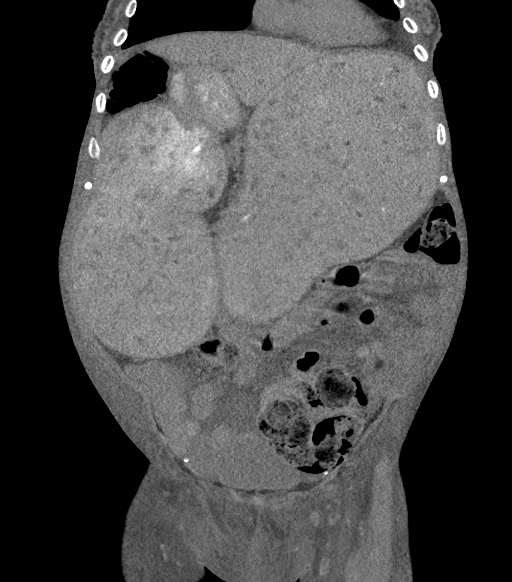
[im 84/151  soft-tissue]
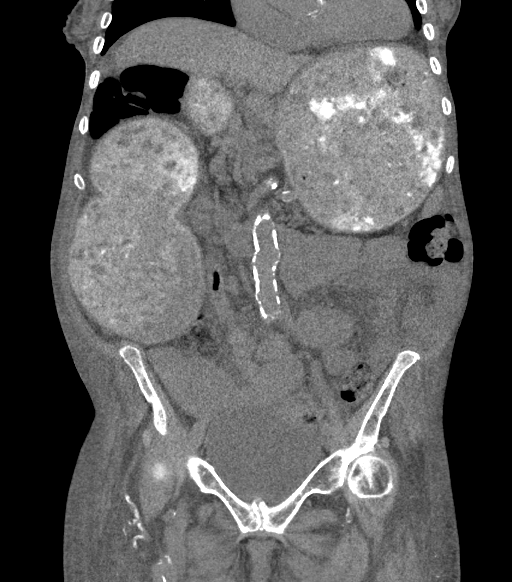

[15 of 46 positions shown; findings below may reference images not displayed]

FINDINGS: Lower chest: There are moderate-sized bilateral pleural effusions
present with bibasilar linear atelectasis. Moderate cardiomegaly is
again noted. No pericardial effusion is seen.

Hepatobiliary: The liver is unremarkable in this unenhanced study.
Surgical clips are present from prior cholecystectomy.

Pancreas: Pancreas appears atrophic. The pancreatic duct is not
dilated.

Spleen: The spleen is unremarkable with calcified splenic artery
noted.

Adrenals/Urinary Tract: Adrenal glands appear normal. No renal
calculi are seen and there is no evidence of hydronephrosis. The
ureters do not appear to be dilated there are difficult to follow in
this patient. The urinary bladder is distended with no abnormality
evident.

Stomach/Bowel: The stomach is massively distended with fluid and
food debris as well as oral contrast within this markedly distended
stomach. In this patient with history of severe pyloric stenosis,
recurrence of severe pyloric stenosis is the primary consideration.
No definite mass is seen. No small bowel distention is seen. The
mucosa of the [place] region is somewhat thickened of
questionable significance. Clinical correlation is recommended. The
majority of the colon is decompressed and very difficult to assess.
The terminal ileum is poorly visualized but the appendix does appear
to fill with air and is unremarkable.

Vascular/Lymphatic: Moderate abdominal aortic atherosclerosis is
present. No aneurysm is noted. There is no evidence of adenopathy.

Reproductive: The uterus is somewhat atrophic. No adnexal lesion is
seen. No fluid is noted within the pelvis.

Other: No abdominal wall hernia is seen.

Musculoskeletal: There is anterolisthesis of L4 on L5 by 5 mm and
anterolisthesis of L3 on L4 by 8 mm. Both of these malalignments
appear to be due to degenerative change of the facet joints. There
is considerable degenerative disc disease at both L3-4 and L4-5
levels. No compression deformity is seen. There is mild degenerative
joint disease of the hips present.
IMPRESSION: 1. Massively distended stomach with food and fluid present. This
most likely is due to recurrence of severe pyloric stenosis by
history.
2. Circumferential thickening of the a no rectal mucosa of
questionable significance. Correlate clinically.
3. Degenerative disc disease at L3-4 and L4-5 as noted above.
4. Moderate-sized bilateral pleural effusions with bibasilar
atelectasis. Cardiomegaly.
